# Patient Record
Sex: Female | Born: 1980 | Race: White | Hispanic: No | Marital: Single | Smoking: Current every day smoker
Health system: Southern US, Community
[De-identification: ages and names within clinical notes are randomized; demographics above are authoritative.]

## PROBLEM LIST (undated history)

## (undated) ENCOUNTER — Emergency Department (HOSPITAL_COMMUNITY): Admission: EM | Payer: Medicaid Other | Source: Home / Self Care

## (undated) DIAGNOSIS — F329 Major depressive disorder, single episode, unspecified: Secondary | ICD-10-CM

## (undated) DIAGNOSIS — K219 Gastro-esophageal reflux disease without esophagitis: Secondary | ICD-10-CM

## (undated) DIAGNOSIS — M869 Osteomyelitis, unspecified: Secondary | ICD-10-CM

## (undated) DIAGNOSIS — F319 Bipolar disorder, unspecified: Secondary | ICD-10-CM

## (undated) DIAGNOSIS — R0600 Dyspnea, unspecified: Secondary | ICD-10-CM

## (undated) DIAGNOSIS — J45909 Unspecified asthma, uncomplicated: Secondary | ICD-10-CM

## (undated) DIAGNOSIS — N289 Disorder of kidney and ureter, unspecified: Secondary | ICD-10-CM

## (undated) DIAGNOSIS — F32A Depression, unspecified: Secondary | ICD-10-CM

## (undated) DIAGNOSIS — T8781 Dehiscence of amputation stump: Secondary | ICD-10-CM

## (undated) DIAGNOSIS — O2690 Pregnancy related conditions, unspecified, unspecified trimester: Secondary | ICD-10-CM

## (undated) DIAGNOSIS — E785 Hyperlipidemia, unspecified: Secondary | ICD-10-CM

## (undated) DIAGNOSIS — I639 Cerebral infarction, unspecified: Secondary | ICD-10-CM

## (undated) DIAGNOSIS — O039 Complete or unspecified spontaneous abortion without complication: Secondary | ICD-10-CM

## (undated) DIAGNOSIS — I10 Essential (primary) hypertension: Secondary | ICD-10-CM

## (undated) DIAGNOSIS — R519 Headache, unspecified: Secondary | ICD-10-CM

## (undated) DIAGNOSIS — F419 Anxiety disorder, unspecified: Secondary | ICD-10-CM

## (undated) DIAGNOSIS — F209 Schizophrenia, unspecified: Secondary | ICD-10-CM

## (undated) DIAGNOSIS — R51 Headache: Secondary | ICD-10-CM

## (undated) DIAGNOSIS — K5900 Constipation, unspecified: Secondary | ICD-10-CM

## (undated) HISTORY — PX: CHOLECYSTECTOMY: SHX55

## (undated) HISTORY — PX: TUBAL LIGATION: SHX77

---

## 2002-10-03 ENCOUNTER — Encounter: Payer: Self-pay | Admitting: *Deleted

## 2002-10-03 ENCOUNTER — Inpatient Hospital Stay (HOSPITAL_COMMUNITY): Admission: AD | Admit: 2002-10-03 | Discharge: 2002-10-03 | Payer: Self-pay | Admitting: Obstetrics and Gynecology

## 2002-10-16 ENCOUNTER — Inpatient Hospital Stay (HOSPITAL_COMMUNITY): Admission: AD | Admit: 2002-10-16 | Discharge: 2002-10-17 | Payer: Self-pay | Admitting: *Deleted

## 2002-10-17 ENCOUNTER — Encounter: Payer: Self-pay | Admitting: Obstetrics and Gynecology

## 2002-11-15 ENCOUNTER — Inpatient Hospital Stay (HOSPITAL_COMMUNITY): Admission: AD | Admit: 2002-11-15 | Discharge: 2002-11-15 | Payer: Self-pay | Admitting: Family Medicine

## 2002-12-04 ENCOUNTER — Encounter: Payer: Self-pay | Admitting: Obstetrics and Gynecology

## 2002-12-04 ENCOUNTER — Inpatient Hospital Stay (HOSPITAL_COMMUNITY): Admission: AD | Admit: 2002-12-04 | Discharge: 2002-12-04 | Payer: Self-pay | Admitting: Obstetrics and Gynecology

## 2003-01-09 ENCOUNTER — Other Ambulatory Visit: Admission: RE | Admit: 2003-01-09 | Discharge: 2003-01-09 | Payer: Self-pay | Admitting: Obstetrics and Gynecology

## 2003-01-11 ENCOUNTER — Encounter: Payer: Self-pay | Admitting: Obstetrics and Gynecology

## 2003-01-11 ENCOUNTER — Ambulatory Visit (HOSPITAL_COMMUNITY): Admission: RE | Admit: 2003-01-11 | Discharge: 2003-01-11 | Payer: Self-pay | Admitting: Obstetrics and Gynecology

## 2003-01-29 ENCOUNTER — Inpatient Hospital Stay (HOSPITAL_COMMUNITY): Admission: AD | Admit: 2003-01-29 | Discharge: 2003-01-29 | Payer: Self-pay | Admitting: Obstetrics and Gynecology

## 2003-01-30 ENCOUNTER — Inpatient Hospital Stay (HOSPITAL_COMMUNITY): Admission: AD | Admit: 2003-01-30 | Discharge: 2003-01-30 | Payer: Self-pay | Admitting: Obstetrics and Gynecology

## 2003-01-30 ENCOUNTER — Encounter: Admission: RE | Admit: 2003-01-30 | Discharge: 2003-01-30 | Payer: Self-pay | Admitting: *Deleted

## 2003-02-06 ENCOUNTER — Encounter: Admission: RE | Admit: 2003-02-06 | Discharge: 2003-02-06 | Payer: Self-pay | Admitting: *Deleted

## 2003-02-19 ENCOUNTER — Ambulatory Visit (HOSPITAL_COMMUNITY): Admission: RE | Admit: 2003-02-19 | Discharge: 2003-02-19 | Payer: Self-pay | Admitting: *Deleted

## 2003-02-28 ENCOUNTER — Inpatient Hospital Stay (HOSPITAL_COMMUNITY): Admission: AD | Admit: 2003-02-28 | Discharge: 2003-02-28 | Payer: Self-pay | Admitting: Family Medicine

## 2003-02-28 ENCOUNTER — Encounter: Admission: RE | Admit: 2003-02-28 | Discharge: 2003-02-28 | Payer: Self-pay | Admitting: Family Medicine

## 2003-03-07 ENCOUNTER — Encounter: Admission: RE | Admit: 2003-03-07 | Discharge: 2003-03-07 | Payer: Self-pay | Admitting: Family Medicine

## 2003-03-21 ENCOUNTER — Ambulatory Visit (HOSPITAL_COMMUNITY): Admission: RE | Admit: 2003-03-21 | Discharge: 2003-03-21 | Payer: Self-pay | Admitting: *Deleted

## 2003-04-03 ENCOUNTER — Inpatient Hospital Stay (HOSPITAL_COMMUNITY): Admission: AD | Admit: 2003-04-03 | Discharge: 2003-04-03 | Payer: Self-pay | Admitting: Obstetrics and Gynecology

## 2003-04-03 ENCOUNTER — Encounter: Payer: Self-pay | Admitting: Obstetrics and Gynecology

## 2003-04-04 ENCOUNTER — Encounter: Admission: RE | Admit: 2003-04-04 | Discharge: 2003-04-04 | Payer: Self-pay | Admitting: *Deleted

## 2003-04-04 ENCOUNTER — Inpatient Hospital Stay (HOSPITAL_COMMUNITY): Admission: AD | Admit: 2003-04-04 | Discharge: 2003-04-09 | Payer: Self-pay | Admitting: Obstetrics & Gynecology

## 2003-04-07 ENCOUNTER — Encounter: Payer: Self-pay | Admitting: Obstetrics & Gynecology

## 2003-04-11 ENCOUNTER — Encounter: Admission: RE | Admit: 2003-04-11 | Discharge: 2003-04-11 | Payer: Self-pay | Admitting: *Deleted

## 2003-04-16 ENCOUNTER — Encounter: Admission: RE | Admit: 2003-04-16 | Discharge: 2003-04-16 | Payer: Self-pay | Admitting: *Deleted

## 2003-04-24 ENCOUNTER — Inpatient Hospital Stay (HOSPITAL_COMMUNITY): Admission: AD | Admit: 2003-04-24 | Discharge: 2003-04-25 | Payer: Self-pay | Admitting: Obstetrics and Gynecology

## 2003-05-05 ENCOUNTER — Inpatient Hospital Stay (HOSPITAL_COMMUNITY): Admission: AD | Admit: 2003-05-05 | Discharge: 2003-05-05 | Payer: Self-pay | Admitting: Obstetrics & Gynecology

## 2003-05-09 ENCOUNTER — Inpatient Hospital Stay (HOSPITAL_COMMUNITY): Admission: AD | Admit: 2003-05-09 | Discharge: 2003-05-13 | Payer: Self-pay | Admitting: Family Medicine

## 2003-05-10 ENCOUNTER — Encounter (INDEPENDENT_AMBULATORY_CARE_PROVIDER_SITE_OTHER): Payer: Self-pay

## 2003-06-11 ENCOUNTER — Inpatient Hospital Stay (HOSPITAL_COMMUNITY): Admission: AD | Admit: 2003-06-11 | Discharge: 2003-06-12 | Payer: Self-pay | Admitting: *Deleted

## 2003-07-18 ENCOUNTER — Inpatient Hospital Stay (HOSPITAL_COMMUNITY): Admission: AD | Admit: 2003-07-18 | Discharge: 2003-07-18 | Payer: Self-pay | Admitting: Obstetrics & Gynecology

## 2003-08-05 ENCOUNTER — Emergency Department (HOSPITAL_COMMUNITY): Admission: EM | Admit: 2003-08-05 | Discharge: 2003-08-06 | Payer: Self-pay | Admitting: Emergency Medicine

## 2003-08-06 ENCOUNTER — Emergency Department (HOSPITAL_COMMUNITY): Admission: AD | Admit: 2003-08-06 | Discharge: 2003-08-06 | Payer: Self-pay | Admitting: *Deleted

## 2003-09-19 ENCOUNTER — Encounter: Admission: RE | Admit: 2003-09-19 | Discharge: 2003-09-19 | Payer: Self-pay | Admitting: Obstetrics and Gynecology

## 2003-09-26 ENCOUNTER — Ambulatory Visit (HOSPITAL_COMMUNITY): Admission: RE | Admit: 2003-09-26 | Discharge: 2003-09-26 | Payer: Self-pay | Admitting: Obstetrics and Gynecology

## 2003-10-02 ENCOUNTER — Encounter: Admission: RE | Admit: 2003-10-02 | Discharge: 2003-10-02 | Payer: Self-pay | Admitting: *Deleted

## 2003-10-03 ENCOUNTER — Inpatient Hospital Stay (HOSPITAL_COMMUNITY): Admission: AD | Admit: 2003-10-03 | Discharge: 2003-10-07 | Payer: Self-pay | Admitting: Obstetrics & Gynecology

## 2003-10-10 ENCOUNTER — Inpatient Hospital Stay (HOSPITAL_COMMUNITY): Admission: AD | Admit: 2003-10-10 | Discharge: 2003-10-10 | Payer: Self-pay | Admitting: Obstetrics & Gynecology

## 2003-10-23 ENCOUNTER — Inpatient Hospital Stay (HOSPITAL_COMMUNITY): Admission: AD | Admit: 2003-10-23 | Discharge: 2003-10-23 | Payer: Self-pay | Admitting: *Deleted

## 2003-11-13 ENCOUNTER — Inpatient Hospital Stay (HOSPITAL_COMMUNITY): Admission: AD | Admit: 2003-11-13 | Discharge: 2003-11-13 | Payer: Self-pay | Admitting: Obstetrics and Gynecology

## 2004-12-01 ENCOUNTER — Inpatient Hospital Stay (HOSPITAL_COMMUNITY): Admission: AD | Admit: 2004-12-01 | Discharge: 2004-12-02 | Payer: Self-pay | Admitting: *Deleted

## 2004-12-02 ENCOUNTER — Inpatient Hospital Stay (HOSPITAL_COMMUNITY): Admission: AD | Admit: 2004-12-02 | Discharge: 2004-12-07 | Payer: Self-pay | Admitting: Obstetrics and Gynecology

## 2004-12-02 ENCOUNTER — Ambulatory Visit: Payer: Self-pay | Admitting: Family Medicine

## 2004-12-09 ENCOUNTER — Ambulatory Visit (HOSPITAL_COMMUNITY): Admission: RE | Admit: 2004-12-09 | Discharge: 2004-12-09 | Payer: Self-pay | Admitting: Obstetrics and Gynecology

## 2004-12-09 ENCOUNTER — Ambulatory Visit: Payer: Self-pay | Admitting: *Deleted

## 2004-12-16 ENCOUNTER — Inpatient Hospital Stay (HOSPITAL_COMMUNITY): Admission: AD | Admit: 2004-12-16 | Discharge: 2004-12-17 | Payer: Self-pay | Admitting: Obstetrics and Gynecology

## 2005-03-06 ENCOUNTER — Inpatient Hospital Stay (HOSPITAL_COMMUNITY): Admission: AD | Admit: 2005-03-06 | Discharge: 2005-03-07 | Payer: Self-pay | Admitting: Obstetrics & Gynecology

## 2005-03-10 ENCOUNTER — Inpatient Hospital Stay (HOSPITAL_COMMUNITY): Admission: AD | Admit: 2005-03-10 | Discharge: 2005-03-12 | Payer: Self-pay | Admitting: Obstetrics and Gynecology

## 2005-03-11 ENCOUNTER — Ambulatory Visit: Payer: Self-pay | Admitting: Obstetrics & Gynecology

## 2005-04-01 ENCOUNTER — Inpatient Hospital Stay (HOSPITAL_COMMUNITY): Admission: AD | Admit: 2005-04-01 | Discharge: 2005-04-01 | Payer: Self-pay | Admitting: *Deleted

## 2005-04-01 ENCOUNTER — Ambulatory Visit: Payer: Self-pay | Admitting: Obstetrics and Gynecology

## 2005-04-27 ENCOUNTER — Inpatient Hospital Stay (HOSPITAL_COMMUNITY): Admission: AD | Admit: 2005-04-27 | Discharge: 2005-04-27 | Payer: Self-pay | Admitting: *Deleted

## 2005-04-27 ENCOUNTER — Ambulatory Visit: Payer: Self-pay | Admitting: Obstetrics and Gynecology

## 2005-05-22 ENCOUNTER — Inpatient Hospital Stay (HOSPITAL_COMMUNITY): Admission: AD | Admit: 2005-05-22 | Discharge: 2005-05-23 | Payer: Self-pay | Admitting: Obstetrics and Gynecology

## 2005-05-23 ENCOUNTER — Observation Stay (HOSPITAL_COMMUNITY): Admission: AD | Admit: 2005-05-23 | Discharge: 2005-05-23 | Payer: Self-pay | Admitting: Family Medicine

## 2005-05-23 ENCOUNTER — Ambulatory Visit: Payer: Self-pay | Admitting: Obstetrics and Gynecology

## 2005-05-29 ENCOUNTER — Inpatient Hospital Stay (HOSPITAL_COMMUNITY): Admission: AD | Admit: 2005-05-29 | Discharge: 2005-05-29 | Payer: Self-pay | Admitting: Obstetrics and Gynecology

## 2005-05-29 ENCOUNTER — Ambulatory Visit: Payer: Self-pay | Admitting: *Deleted

## 2005-05-30 ENCOUNTER — Inpatient Hospital Stay (HOSPITAL_COMMUNITY): Admission: AD | Admit: 2005-05-30 | Discharge: 2005-05-31 | Payer: Self-pay | Admitting: *Deleted

## 2005-05-30 ENCOUNTER — Inpatient Hospital Stay (HOSPITAL_COMMUNITY): Admission: AD | Admit: 2005-05-30 | Discharge: 2005-05-30 | Payer: Self-pay | Admitting: *Deleted

## 2005-06-01 ENCOUNTER — Ambulatory Visit (HOSPITAL_COMMUNITY): Admission: RE | Admit: 2005-06-01 | Discharge: 2005-06-01 | Payer: Self-pay | Admitting: Family Medicine

## 2005-06-01 ENCOUNTER — Inpatient Hospital Stay (HOSPITAL_COMMUNITY): Admission: AD | Admit: 2005-06-01 | Discharge: 2005-06-01 | Payer: Self-pay | Admitting: *Deleted

## 2005-06-19 ENCOUNTER — Ambulatory Visit: Payer: Self-pay | Admitting: Obstetrics and Gynecology

## 2005-06-19 ENCOUNTER — Inpatient Hospital Stay (HOSPITAL_COMMUNITY): Admission: AD | Admit: 2005-06-19 | Discharge: 2005-06-20 | Payer: Self-pay | Admitting: *Deleted

## 2005-07-01 ENCOUNTER — Ambulatory Visit: Payer: Self-pay | Admitting: *Deleted

## 2005-07-01 ENCOUNTER — Inpatient Hospital Stay (HOSPITAL_COMMUNITY): Admission: AD | Admit: 2005-07-01 | Discharge: 2005-07-02 | Payer: Self-pay | Admitting: Obstetrics and Gynecology

## 2005-07-04 ENCOUNTER — Ambulatory Visit: Payer: Self-pay | Admitting: Obstetrics and Gynecology

## 2005-07-04 ENCOUNTER — Inpatient Hospital Stay (HOSPITAL_COMMUNITY): Admission: AD | Admit: 2005-07-04 | Discharge: 2005-07-04 | Payer: Self-pay | Admitting: Obstetrics & Gynecology

## 2005-07-07 ENCOUNTER — Ambulatory Visit: Payer: Self-pay | Admitting: *Deleted

## 2005-07-07 ENCOUNTER — Inpatient Hospital Stay (HOSPITAL_COMMUNITY): Admission: RE | Admit: 2005-07-07 | Discharge: 2005-07-07 | Payer: Self-pay | Admitting: *Deleted

## 2005-07-08 ENCOUNTER — Ambulatory Visit (HOSPITAL_COMMUNITY): Admission: RE | Admit: 2005-07-08 | Discharge: 2005-07-08 | Payer: Self-pay | Admitting: *Deleted

## 2005-07-08 ENCOUNTER — Ambulatory Visit: Payer: Self-pay | Admitting: *Deleted

## 2005-07-09 ENCOUNTER — Ambulatory Visit: Payer: Self-pay | Admitting: *Deleted

## 2005-07-09 ENCOUNTER — Inpatient Hospital Stay (HOSPITAL_COMMUNITY): Admission: AD | Admit: 2005-07-09 | Discharge: 2005-07-11 | Payer: Self-pay | Admitting: *Deleted

## 2005-07-09 ENCOUNTER — Encounter (INDEPENDENT_AMBULATORY_CARE_PROVIDER_SITE_OTHER): Payer: Self-pay | Admitting: Specialist

## 2005-07-18 ENCOUNTER — Inpatient Hospital Stay (HOSPITAL_COMMUNITY): Admission: AD | Admit: 2005-07-18 | Discharge: 2005-07-18 | Payer: Self-pay | Admitting: Obstetrics and Gynecology

## 2006-02-28 ENCOUNTER — Emergency Department (HOSPITAL_COMMUNITY): Admission: EM | Admit: 2006-02-28 | Discharge: 2006-03-01 | Payer: Self-pay | Admitting: Emergency Medicine

## 2006-03-21 ENCOUNTER — Emergency Department: Payer: Self-pay | Admitting: Emergency Medicine

## 2006-04-25 ENCOUNTER — Emergency Department (HOSPITAL_COMMUNITY): Admission: EM | Admit: 2006-04-25 | Discharge: 2006-04-25 | Payer: Self-pay | Admitting: Emergency Medicine

## 2006-05-01 ENCOUNTER — Emergency Department (HOSPITAL_COMMUNITY): Admission: EM | Admit: 2006-05-01 | Discharge: 2006-05-01 | Payer: Self-pay | Admitting: Emergency Medicine

## 2006-06-18 ENCOUNTER — Emergency Department (HOSPITAL_COMMUNITY): Admission: EM | Admit: 2006-06-18 | Discharge: 2006-06-18 | Payer: Self-pay | Admitting: Emergency Medicine

## 2006-06-25 ENCOUNTER — Emergency Department (HOSPITAL_COMMUNITY): Admission: EM | Admit: 2006-06-25 | Discharge: 2006-06-25 | Payer: Self-pay | Admitting: Emergency Medicine

## 2006-12-02 ENCOUNTER — Emergency Department (HOSPITAL_COMMUNITY): Admission: EM | Admit: 2006-12-02 | Discharge: 2006-12-02 | Payer: Self-pay | Admitting: Family Medicine

## 2006-12-06 ENCOUNTER — Ambulatory Visit: Payer: Self-pay | Admitting: Family Medicine

## 2006-12-13 ENCOUNTER — Emergency Department (HOSPITAL_COMMUNITY): Admission: EM | Admit: 2006-12-13 | Discharge: 2006-12-13 | Payer: Self-pay | Admitting: Family Medicine

## 2007-01-30 ENCOUNTER — Emergency Department (HOSPITAL_COMMUNITY): Admission: EM | Admit: 2007-01-30 | Discharge: 2007-01-30 | Payer: Self-pay | Admitting: Emergency Medicine

## 2007-02-07 ENCOUNTER — Emergency Department (HOSPITAL_COMMUNITY): Admission: EM | Admit: 2007-02-07 | Discharge: 2007-02-07 | Payer: Self-pay | Admitting: Family Medicine

## 2007-04-25 ENCOUNTER — Emergency Department (HOSPITAL_COMMUNITY): Admission: EM | Admit: 2007-04-25 | Discharge: 2007-04-25 | Payer: Self-pay | Admitting: Emergency Medicine

## 2007-06-20 ENCOUNTER — Emergency Department (HOSPITAL_COMMUNITY): Admission: EM | Admit: 2007-06-20 | Discharge: 2007-06-20 | Payer: Self-pay | Admitting: Emergency Medicine

## 2007-06-21 ENCOUNTER — Emergency Department (HOSPITAL_COMMUNITY): Admission: EM | Admit: 2007-06-21 | Discharge: 2007-06-22 | Payer: Self-pay | Admitting: Emergency Medicine

## 2007-06-25 ENCOUNTER — Emergency Department (HOSPITAL_COMMUNITY): Admission: EM | Admit: 2007-06-25 | Discharge: 2007-06-25 | Payer: Self-pay | Admitting: Emergency Medicine

## 2007-07-02 ENCOUNTER — Emergency Department (HOSPITAL_COMMUNITY): Admission: EM | Admit: 2007-07-02 | Discharge: 2007-07-02 | Payer: Self-pay | Admitting: Emergency Medicine

## 2007-08-12 ENCOUNTER — Emergency Department (HOSPITAL_COMMUNITY): Admission: EM | Admit: 2007-08-12 | Discharge: 2007-08-12 | Payer: Self-pay | Admitting: Emergency Medicine

## 2007-09-13 ENCOUNTER — Emergency Department (HOSPITAL_COMMUNITY): Admission: EM | Admit: 2007-09-13 | Discharge: 2007-09-13 | Payer: Self-pay | Admitting: Emergency Medicine

## 2007-09-15 ENCOUNTER — Emergency Department (HOSPITAL_COMMUNITY): Admission: EM | Admit: 2007-09-15 | Discharge: 2007-09-16 | Payer: Self-pay | Admitting: Emergency Medicine

## 2007-09-16 ENCOUNTER — Emergency Department (HOSPITAL_COMMUNITY): Admission: EM | Admit: 2007-09-16 | Discharge: 2007-09-16 | Payer: Self-pay | Admitting: Emergency Medicine

## 2007-09-21 ENCOUNTER — Emergency Department (HOSPITAL_COMMUNITY): Admission: EM | Admit: 2007-09-21 | Discharge: 2007-09-21 | Payer: Self-pay | Admitting: Emergency Medicine

## 2007-09-24 ENCOUNTER — Emergency Department (HOSPITAL_COMMUNITY): Admission: EM | Admit: 2007-09-24 | Discharge: 2007-09-25 | Payer: Self-pay | Admitting: Emergency Medicine

## 2007-11-22 ENCOUNTER — Emergency Department (HOSPITAL_COMMUNITY): Admission: EM | Admit: 2007-11-22 | Discharge: 2007-11-22 | Payer: Self-pay | Admitting: Family Medicine

## 2007-12-01 ENCOUNTER — Emergency Department (HOSPITAL_COMMUNITY): Admission: EM | Admit: 2007-12-01 | Discharge: 2007-12-02 | Payer: Self-pay | Admitting: Emergency Medicine

## 2008-01-06 ENCOUNTER — Emergency Department (HOSPITAL_COMMUNITY): Admission: EM | Admit: 2008-01-06 | Discharge: 2008-01-06 | Payer: Self-pay | Admitting: Emergency Medicine

## 2008-01-10 ENCOUNTER — Emergency Department (HOSPITAL_COMMUNITY): Admission: EM | Admit: 2008-01-10 | Discharge: 2008-01-11 | Payer: Self-pay | Admitting: Emergency Medicine

## 2008-01-23 ENCOUNTER — Emergency Department (HOSPITAL_COMMUNITY): Admission: EM | Admit: 2008-01-23 | Discharge: 2008-01-24 | Payer: Self-pay | Admitting: Emergency Medicine

## 2008-01-30 ENCOUNTER — Emergency Department (HOSPITAL_COMMUNITY): Admission: EM | Admit: 2008-01-30 | Discharge: 2008-01-31 | Payer: Self-pay | Admitting: Emergency Medicine

## 2008-02-03 ENCOUNTER — Emergency Department (HOSPITAL_COMMUNITY): Admission: EM | Admit: 2008-02-03 | Discharge: 2008-02-03 | Payer: Self-pay | Admitting: Emergency Medicine

## 2008-02-10 ENCOUNTER — Emergency Department (HOSPITAL_COMMUNITY): Admission: EM | Admit: 2008-02-10 | Discharge: 2008-02-10 | Payer: Self-pay | Admitting: Emergency Medicine

## 2008-02-16 ENCOUNTER — Emergency Department (HOSPITAL_COMMUNITY): Admission: EM | Admit: 2008-02-16 | Discharge: 2008-02-17 | Payer: Self-pay | Admitting: Emergency Medicine

## 2008-02-19 ENCOUNTER — Emergency Department (HOSPITAL_COMMUNITY): Admission: EM | Admit: 2008-02-19 | Discharge: 2008-02-19 | Payer: Self-pay | Admitting: Emergency Medicine

## 2008-04-03 ENCOUNTER — Emergency Department (HOSPITAL_COMMUNITY): Admission: EM | Admit: 2008-04-03 | Discharge: 2008-04-04 | Payer: Self-pay | Admitting: Emergency Medicine

## 2008-04-19 ENCOUNTER — Emergency Department (HOSPITAL_COMMUNITY): Admission: EM | Admit: 2008-04-19 | Discharge: 2008-04-20 | Payer: Self-pay | Admitting: Emergency Medicine

## 2008-04-20 ENCOUNTER — Emergency Department (HOSPITAL_COMMUNITY): Admission: EM | Admit: 2008-04-20 | Discharge: 2008-04-21 | Payer: Self-pay | Admitting: Emergency Medicine

## 2008-04-23 ENCOUNTER — Emergency Department (HOSPITAL_COMMUNITY): Admission: EM | Admit: 2008-04-23 | Discharge: 2008-04-24 | Payer: Self-pay | Admitting: Emergency Medicine

## 2008-05-12 ENCOUNTER — Emergency Department (HOSPITAL_COMMUNITY): Admission: EM | Admit: 2008-05-12 | Discharge: 2008-05-13 | Payer: Self-pay | Admitting: Emergency Medicine

## 2008-05-19 ENCOUNTER — Emergency Department (HOSPITAL_COMMUNITY): Admission: EM | Admit: 2008-05-19 | Discharge: 2008-05-19 | Payer: Self-pay | Admitting: Emergency Medicine

## 2008-06-03 ENCOUNTER — Emergency Department (HOSPITAL_COMMUNITY): Admission: EM | Admit: 2008-06-03 | Discharge: 2008-06-03 | Payer: Self-pay | Admitting: Emergency Medicine

## 2008-06-09 ENCOUNTER — Emergency Department (HOSPITAL_COMMUNITY): Admission: EM | Admit: 2008-06-09 | Discharge: 2008-06-09 | Payer: Self-pay | Admitting: Emergency Medicine

## 2008-06-14 ENCOUNTER — Emergency Department (HOSPITAL_COMMUNITY): Admission: EM | Admit: 2008-06-14 | Discharge: 2008-06-15 | Payer: Self-pay | Admitting: Emergency Medicine

## 2008-06-20 ENCOUNTER — Other Ambulatory Visit: Payer: Self-pay | Admitting: Emergency Medicine

## 2008-06-20 ENCOUNTER — Inpatient Hospital Stay (HOSPITAL_COMMUNITY): Admission: AD | Admit: 2008-06-20 | Discharge: 2008-06-20 | Payer: Self-pay | Admitting: Obstetrics & Gynecology

## 2008-06-24 ENCOUNTER — Encounter (INDEPENDENT_AMBULATORY_CARE_PROVIDER_SITE_OTHER): Payer: Self-pay | Admitting: Family Medicine

## 2008-06-24 ENCOUNTER — Ambulatory Visit: Payer: Self-pay | Admitting: Internal Medicine

## 2008-06-24 LAB — CONVERTED CEMR LAB
AST: 9 units/L (ref 0–37)
Albumin: 4.1 g/dL (ref 3.5–5.2)
Alkaline Phosphatase: 60 units/L (ref 39–117)
BUN: 11 mg/dL (ref 6–23)
Basophils Absolute: 0.1 10*3/uL (ref 0.0–0.1)
Basophils Relative: 1 % (ref 0–1)
CO2: 20 meq/L (ref 19–32)
Calcium: 8.7 mg/dL (ref 8.4–10.5)
Chloride: 103 meq/L (ref 96–112)
Cholesterol: 176 mg/dL (ref 0–200)
Creatinine, Ser: 0.91 mg/dL (ref 0.40–1.20)
Eosinophils Absolute: 0.6 10*3/uL (ref 0.0–0.7)
Eosinophils Relative: 6 % — ABNORMAL HIGH (ref 0–5)
Glucose, Bld: 296 mg/dL — ABNORMAL HIGH (ref 70–99)
HCT: 44.6 % (ref 36.0–46.0)
HDL: 32 mg/dL — ABNORMAL LOW (ref >39)
Hemoglobin: 15.1 g/dL — ABNORMAL HIGH (ref 12.0–15.0)
Lymphocytes Relative: 34 % (ref 12–46)
Lymphs Abs: 3.5 10*3/uL (ref 0.7–4.0)
MCHC: 33.9 g/dL (ref 30.0–36.0)
MCV: 92.7 fL (ref 78.0–100.0)
Microalb, Ur: 2.34 mg/dL — ABNORMAL HIGH (ref 0.00–1.89)
Monocytes Absolute: 0.6 10*3/uL (ref 0.1–1.0)
Monocytes Relative: 6 % (ref 3–12)
Neutro Abs: 5.4 10*3/uL (ref 1.7–7.7)
Neutrophils Relative %: 53 % (ref 43–77)
Platelets: 236 10*3/uL (ref 150–400)
Potassium: 4.8 meq/L (ref 3.5–5.3)
RBC: 4.81 M/uL (ref 3.87–5.11)
RDW: 12.7 % (ref 11.5–15.5)
Sodium: 135 meq/L (ref 135–145)
Total Bilirubin: 0.3 mg/dL (ref 0.3–1.2)
Total CHOL/HDL Ratio: 5.5
Total Protein: 6.9 g/dL (ref 6.0–8.3)
Triglycerides: 465 mg/dL — ABNORMAL HIGH (ref <150)
WBC: 10.2 10*3/uL (ref 4.0–10.5)

## 2008-06-28 ENCOUNTER — Ambulatory Visit (HOSPITAL_COMMUNITY): Admission: RE | Admit: 2008-06-28 | Discharge: 2008-06-28 | Payer: Self-pay | Admitting: Family Medicine

## 2008-07-11 ENCOUNTER — Emergency Department (HOSPITAL_COMMUNITY): Admission: EM | Admit: 2008-07-11 | Discharge: 2008-07-11 | Payer: Self-pay | Admitting: Emergency Medicine

## 2008-07-13 ENCOUNTER — Inpatient Hospital Stay (HOSPITAL_COMMUNITY): Admission: AD | Admit: 2008-07-13 | Discharge: 2008-07-13 | Payer: Self-pay | Admitting: Family Medicine

## 2008-07-14 ENCOUNTER — Emergency Department (HOSPITAL_COMMUNITY): Admission: EM | Admit: 2008-07-14 | Discharge: 2008-07-15 | Payer: Self-pay | Admitting: Emergency Medicine

## 2008-08-03 ENCOUNTER — Emergency Department (HOSPITAL_COMMUNITY): Admission: EM | Admit: 2008-08-03 | Discharge: 2008-08-04 | Payer: Self-pay | Admitting: Emergency Medicine

## 2008-08-09 ENCOUNTER — Ambulatory Visit: Payer: Self-pay | Admitting: Surgery

## 2008-08-09 ENCOUNTER — Ambulatory Visit: Payer: Self-pay | Admitting: Internal Medicine

## 2008-08-09 ENCOUNTER — Ambulatory Visit (HOSPITAL_COMMUNITY): Admission: RE | Admit: 2008-08-09 | Discharge: 2008-08-09 | Payer: Self-pay | Admitting: Internal Medicine

## 2008-08-09 ENCOUNTER — Encounter: Payer: Self-pay | Admitting: Internal Medicine

## 2008-08-21 ENCOUNTER — Emergency Department (HOSPITAL_COMMUNITY): Admission: EM | Admit: 2008-08-21 | Discharge: 2008-08-21 | Payer: Self-pay | Admitting: Emergency Medicine

## 2008-10-06 ENCOUNTER — Emergency Department (HOSPITAL_COMMUNITY): Admission: EM | Admit: 2008-10-06 | Discharge: 2008-10-06 | Payer: Self-pay | Admitting: Emergency Medicine

## 2008-10-16 ENCOUNTER — Emergency Department (HOSPITAL_COMMUNITY): Admission: EM | Admit: 2008-10-16 | Discharge: 2008-10-16 | Payer: Self-pay | Admitting: Emergency Medicine

## 2008-10-19 ENCOUNTER — Emergency Department (HOSPITAL_COMMUNITY): Admission: EM | Admit: 2008-10-19 | Discharge: 2008-10-20 | Payer: Self-pay | Admitting: Emergency Medicine

## 2008-10-25 ENCOUNTER — Ambulatory Visit: Payer: Self-pay | Admitting: Family Medicine

## 2008-10-31 ENCOUNTER — Ambulatory Visit: Payer: Self-pay | Admitting: Vascular Surgery

## 2008-10-31 ENCOUNTER — Encounter: Payer: Self-pay | Admitting: Internal Medicine

## 2008-10-31 ENCOUNTER — Ambulatory Visit (HOSPITAL_COMMUNITY): Admission: RE | Admit: 2008-10-31 | Discharge: 2008-10-31 | Payer: Self-pay | Admitting: Internal Medicine

## 2008-11-07 ENCOUNTER — Other Ambulatory Visit: Payer: Self-pay

## 2008-11-08 ENCOUNTER — Ambulatory Visit: Payer: Self-pay | Admitting: Psychiatry

## 2008-11-08 ENCOUNTER — Other Ambulatory Visit: Payer: Self-pay | Admitting: Emergency Medicine

## 2008-11-08 ENCOUNTER — Observation Stay (HOSPITAL_COMMUNITY): Admission: EM | Admit: 2008-11-08 | Discharge: 2008-11-08 | Payer: Self-pay | Admitting: Psychiatry

## 2008-11-21 ENCOUNTER — Emergency Department (HOSPITAL_COMMUNITY): Admission: EM | Admit: 2008-11-21 | Discharge: 2008-11-21 | Payer: Self-pay | Admitting: Emergency Medicine

## 2008-11-26 ENCOUNTER — Emergency Department (HOSPITAL_COMMUNITY): Admission: EM | Admit: 2008-11-26 | Discharge: 2008-11-27 | Payer: Self-pay | Admitting: Emergency Medicine

## 2008-11-29 ENCOUNTER — Emergency Department (HOSPITAL_COMMUNITY): Admission: EM | Admit: 2008-11-29 | Discharge: 2008-11-29 | Payer: Self-pay | Admitting: Emergency Medicine

## 2008-12-06 ENCOUNTER — Emergency Department (HOSPITAL_COMMUNITY): Admission: EM | Admit: 2008-12-06 | Discharge: 2008-12-06 | Payer: Self-pay | Admitting: Emergency Medicine

## 2008-12-12 ENCOUNTER — Encounter (INDEPENDENT_AMBULATORY_CARE_PROVIDER_SITE_OTHER): Payer: Self-pay | Admitting: Adult Health

## 2008-12-12 ENCOUNTER — Ambulatory Visit: Payer: Self-pay | Admitting: Internal Medicine

## 2008-12-12 LAB — CONVERTED CEMR LAB
ALT: <8 units/L (ref 0–35)
AST: 8 units/L (ref 0–37)
Albumin: 4.1 g/dL (ref 3.5–5.2)
Alkaline Phosphatase: 59 units/L (ref 39–117)
BUN: 10 mg/dL (ref 6–23)
Band Neutrophils: 0 % (ref 0–10)
Basophils Absolute: 0 10*3/uL (ref 0.0–0.1)
Basophils Relative: 0 % (ref 0–1)
CO2: 20 meq/L (ref 19–32)
Calcium: 8.9 mg/dL (ref 8.4–10.5)
Chloride: 102 meq/L (ref 96–112)
Cholesterol: 177 mg/dL (ref 0–200)
Creatinine, Ser: 0.85 mg/dL (ref 0.40–1.20)
Eosinophils Absolute: 0.5 10*3/uL (ref 0.0–0.7)
Eosinophils Relative: 5 % (ref 0–5)
Glucose, Bld: 377 mg/dL — ABNORMAL HIGH (ref 70–99)
HCT: 44.7 % (ref 36.0–46.0)
HDL: 27 mg/dL — ABNORMAL LOW (ref >39)
Hemoglobin: 15.8 g/dL — ABNORMAL HIGH (ref 12.0–15.0)
Lipase: 36 units/L (ref 0–75)
Lymphocytes Relative: 32 % (ref 12–46)
Lymphs Abs: 2.9 10*3/uL (ref 0.7–4.0)
MCHC: 35.3 g/dL (ref 30.0–36.0)
MCV: 90.5 fL (ref 78.0–100.0)
Monocytes Absolute: 0.6 10*3/uL (ref 0.1–1.0)
Monocytes Relative: 6 % (ref 3–12)
Neutro Abs: 5.1 10*3/uL (ref 1.7–7.7)
Neutrophils Relative %: 56 % (ref 43–77)
Platelets: 241 10*3/uL (ref 150–400)
Potassium: 4.8 meq/L (ref 3.5–5.3)
RBC: 4.94 M/uL (ref 3.87–5.11)
RDW: 12.3 % (ref 11.5–15.5)
Sodium: 135 meq/L (ref 135–145)
TSH: 0.992 microintl units/mL (ref 0.350–4.50)
Total Bilirubin: 0.5 mg/dL (ref 0.3–1.2)
Total CHOL/HDL Ratio: 6.6
Total Protein: 7.1 g/dL (ref 6.0–8.3)
Triglycerides: 579 mg/dL — ABNORMAL HIGH (ref <150)
WBC: 9 10*3/uL (ref 4.0–10.5)

## 2008-12-27 ENCOUNTER — Ambulatory Visit: Payer: Self-pay | Admitting: Internal Medicine

## 2008-12-31 ENCOUNTER — Emergency Department (HOSPITAL_COMMUNITY): Admission: EM | Admit: 2008-12-31 | Discharge: 2009-01-01 | Payer: Self-pay | Admitting: Emergency Medicine

## 2009-01-02 ENCOUNTER — Ambulatory Visit: Payer: Self-pay | Admitting: Obstetrics and Gynecology

## 2009-01-03 ENCOUNTER — Ambulatory Visit (HOSPITAL_COMMUNITY): Admission: RE | Admit: 2009-01-03 | Discharge: 2009-01-03 | Payer: Self-pay | Admitting: Family Medicine

## 2009-01-16 ENCOUNTER — Emergency Department (HOSPITAL_COMMUNITY): Admission: EM | Admit: 2009-01-16 | Discharge: 2009-01-16 | Payer: Self-pay | Admitting: Emergency Medicine

## 2009-01-27 ENCOUNTER — Ambulatory Visit: Payer: Self-pay | Admitting: Internal Medicine

## 2009-02-26 ENCOUNTER — Emergency Department (HOSPITAL_COMMUNITY): Admission: EM | Admit: 2009-02-26 | Discharge: 2009-02-26 | Payer: Self-pay | Admitting: Emergency Medicine

## 2009-03-08 ENCOUNTER — Emergency Department (HOSPITAL_COMMUNITY): Admission: EM | Admit: 2009-03-08 | Discharge: 2009-03-08 | Payer: Self-pay | Admitting: Emergency Medicine

## 2009-03-18 ENCOUNTER — Emergency Department (HOSPITAL_COMMUNITY): Admission: EM | Admit: 2009-03-18 | Discharge: 2009-03-18 | Payer: Self-pay | Admitting: Emergency Medicine

## 2009-04-06 ENCOUNTER — Emergency Department (HOSPITAL_COMMUNITY): Admission: EM | Admit: 2009-04-06 | Discharge: 2009-04-06 | Payer: Self-pay | Admitting: Emergency Medicine

## 2009-04-30 ENCOUNTER — Emergency Department (HOSPITAL_COMMUNITY): Admission: EM | Admit: 2009-04-30 | Discharge: 2009-04-30 | Payer: Self-pay | Admitting: Emergency Medicine

## 2009-05-02 ENCOUNTER — Emergency Department (HOSPITAL_COMMUNITY): Admission: EM | Admit: 2009-05-02 | Discharge: 2009-05-03 | Payer: Self-pay | Admitting: Emergency Medicine

## 2009-05-06 ENCOUNTER — Emergency Department (HOSPITAL_COMMUNITY): Admission: EM | Admit: 2009-05-06 | Discharge: 2009-05-06 | Payer: Self-pay | Admitting: Emergency Medicine

## 2009-05-20 ENCOUNTER — Ambulatory Visit: Payer: Self-pay | Admitting: Internal Medicine

## 2009-05-20 ENCOUNTER — Encounter (INDEPENDENT_AMBULATORY_CARE_PROVIDER_SITE_OTHER): Payer: Self-pay | Admitting: Adult Health

## 2009-05-20 LAB — CONVERTED CEMR LAB
AST: 9 units/L (ref 0–37)
Albumin: 4.1 g/dL (ref 3.5–5.2)
Alkaline Phosphatase: 61 units/L (ref 39–117)
Amphetamine Screen, Ur: NEGATIVE
BUN: 8 mg/dL (ref 6–23)
Basophils Absolute: 0 10*3/uL (ref 0.0–0.1)
Basophils Relative: 0 % (ref 0–1)
CO2: 27 meq/L (ref 19–32)
Calcium: 8.8 mg/dL (ref 8.4–10.5)
Chloride: 102 meq/L (ref 96–112)
Cholesterol: 156 mg/dL (ref 0–200)
Creatinine, Ser: 0.8 mg/dL (ref 0.40–1.20)
Creatinine,U: 62.2 mg/dL
Eosinophils Absolute: 0.5 10*3/uL (ref 0.0–0.7)
Eosinophils Relative: 6 % — ABNORMAL HIGH (ref 0–5)
Glucose, Bld: 348 mg/dL — ABNORMAL HIGH (ref 70–99)
HCT: 43 % (ref 36.0–46.0)
HDL: 30 mg/dL — ABNORMAL LOW (ref >39)
Hemoglobin: 15.3 g/dL — ABNORMAL HIGH (ref 12.0–15.0)
LDL Cholesterol: 60 mg/dL (ref 0–99)
Lymphocytes Relative: 31 % (ref 12–46)
Lymphs Abs: 2.8 10*3/uL (ref 0.7–4.0)
MCHC: 35.6 g/dL (ref 30.0–36.0)
MCV: 88.8 fL (ref 78.0–100.0)
Marijuana Metabolite: NEGATIVE
Microalb, Ur: 1.35 mg/dL (ref 0.00–1.89)
Monocytes Absolute: 0.4 10*3/uL (ref 0.1–1.0)
Monocytes Relative: 5 % (ref 3–12)
Neutro Abs: 5.3 10*3/uL (ref 1.7–7.7)
Neutrophils Relative %: 59 % (ref 43–77)
Opiate Screen, Urine: NEGATIVE
Phencyclidine (PCP): NEGATIVE
Platelets: 266 10*3/uL (ref 150–400)
Potassium: 3.9 meq/L (ref 3.5–5.3)
Propoxyphene: NEGATIVE
RBC: 4.84 M/uL (ref 3.87–5.11)
RDW: 12 % (ref 11.5–15.5)
Sodium: 138 meq/L (ref 135–145)
Total Bilirubin: 0.4 mg/dL (ref 0.3–1.2)
Total Protein: 7.1 g/dL (ref 6.0–8.3)
Triglycerides: 328 mg/dL — ABNORMAL HIGH (ref <150)
VLDL: 66 mg/dL — ABNORMAL HIGH (ref 0–40)
WBC: 9.1 10*3/uL (ref 4.0–10.5)

## 2009-05-28 ENCOUNTER — Ambulatory Visit: Payer: Self-pay | Admitting: Internal Medicine

## 2009-06-09 ENCOUNTER — Emergency Department (HOSPITAL_COMMUNITY): Admission: EM | Admit: 2009-06-09 | Discharge: 2009-06-10 | Payer: Self-pay | Admitting: Emergency Medicine

## 2009-06-16 ENCOUNTER — Emergency Department (HOSPITAL_COMMUNITY): Admission: EM | Admit: 2009-06-16 | Discharge: 2009-06-16 | Payer: Self-pay | Admitting: Emergency Medicine

## 2009-08-04 ENCOUNTER — Emergency Department (HOSPITAL_COMMUNITY): Admission: EM | Admit: 2009-08-04 | Discharge: 2009-08-04 | Payer: Self-pay | Admitting: Emergency Medicine

## 2009-08-06 ENCOUNTER — Emergency Department (HOSPITAL_COMMUNITY): Admission: EM | Admit: 2009-08-06 | Discharge: 2009-08-06 | Payer: Self-pay | Admitting: Family Medicine

## 2009-08-11 ENCOUNTER — Encounter (INDEPENDENT_AMBULATORY_CARE_PROVIDER_SITE_OTHER): Payer: Self-pay | Admitting: Adult Health

## 2009-08-11 ENCOUNTER — Ambulatory Visit: Payer: Self-pay | Admitting: Family Medicine

## 2009-08-11 LAB — CONVERTED CEMR LAB
Basophils Absolute: 0 10*3/uL (ref 0.0–0.1)
Basophils Relative: 0 % (ref 0–1)
Eosinophils Absolute: 0.3 10*3/uL (ref 0.0–0.7)
Eosinophils Relative: 3 % (ref 0–5)
HCT: 41.8 % (ref 36.0–46.0)
Hemoglobin: 14.2 g/dL (ref 12.0–15.0)
Lymphocytes Relative: 30 % (ref 12–46)
Lymphs Abs: 3.1 10*3/uL (ref 0.7–4.0)
MCHC: 34 g/dL (ref 30.0–36.0)
MCV: 91.3 fL (ref 78.0–100.0)
Monocytes Absolute: 0.5 10*3/uL (ref 0.1–1.0)
Monocytes Relative: 5 % (ref 3–12)
Neutro Abs: 6.3 10*3/uL (ref 1.7–7.7)
Neutrophils Relative %: 62 % (ref 43–77)
Platelets: 276 10*3/uL (ref 150–400)
RBC: 4.58 M/uL (ref 3.87–5.11)
RDW: 11.9 % (ref 11.5–15.5)
WBC: 10.2 10*3/uL (ref 4.0–10.5)

## 2009-08-13 ENCOUNTER — Ambulatory Visit: Payer: Self-pay | Admitting: Internal Medicine

## 2009-08-14 ENCOUNTER — Encounter (INDEPENDENT_AMBULATORY_CARE_PROVIDER_SITE_OTHER): Payer: Self-pay | Admitting: Adult Health

## 2009-08-23 ENCOUNTER — Emergency Department (HOSPITAL_COMMUNITY): Admission: EM | Admit: 2009-08-23 | Discharge: 2009-08-24 | Payer: Self-pay | Admitting: Emergency Medicine

## 2009-10-20 ENCOUNTER — Emergency Department (HOSPITAL_COMMUNITY): Admission: EM | Admit: 2009-10-20 | Discharge: 2009-10-20 | Payer: Self-pay | Admitting: Emergency Medicine

## 2009-11-27 ENCOUNTER — Emergency Department (HOSPITAL_COMMUNITY): Admission: EM | Admit: 2009-11-27 | Discharge: 2009-11-28 | Payer: Self-pay | Admitting: Emergency Medicine

## 2009-12-03 ENCOUNTER — Emergency Department (HOSPITAL_COMMUNITY): Admission: EM | Admit: 2009-12-03 | Discharge: 2009-12-04 | Payer: Self-pay | Admitting: Emergency Medicine

## 2009-12-07 ENCOUNTER — Emergency Department (HOSPITAL_COMMUNITY): Admission: EM | Admit: 2009-12-07 | Discharge: 2009-12-08 | Payer: Self-pay | Admitting: Emergency Medicine

## 2010-01-26 ENCOUNTER — Emergency Department (HOSPITAL_COMMUNITY): Admission: EM | Admit: 2010-01-26 | Discharge: 2010-01-27 | Payer: Self-pay | Admitting: Emergency Medicine

## 2010-02-02 ENCOUNTER — Emergency Department (HOSPITAL_COMMUNITY): Admission: EM | Admit: 2010-02-02 | Discharge: 2010-02-03 | Payer: Self-pay | Admitting: Emergency Medicine

## 2010-02-03 ENCOUNTER — Emergency Department (HOSPITAL_COMMUNITY): Admission: EM | Admit: 2010-02-03 | Discharge: 2010-02-03 | Payer: Self-pay | Admitting: Emergency Medicine

## 2010-03-29 ENCOUNTER — Emergency Department (HOSPITAL_COMMUNITY): Admission: EM | Admit: 2010-03-29 | Discharge: 2010-03-29 | Payer: Self-pay | Admitting: Emergency Medicine

## 2010-07-11 ENCOUNTER — Emergency Department (HOSPITAL_COMMUNITY): Admission: EM | Admit: 2010-07-11 | Discharge: 2010-07-11 | Payer: Self-pay | Admitting: Emergency Medicine

## 2010-07-12 ENCOUNTER — Emergency Department (HOSPITAL_COMMUNITY): Admission: EM | Admit: 2010-07-12 | Discharge: 2010-07-13 | Payer: Self-pay | Admitting: Emergency Medicine

## 2010-08-25 ENCOUNTER — Emergency Department (HOSPITAL_COMMUNITY): Admission: EM | Admit: 2010-08-25 | Discharge: 2010-08-26 | Payer: Self-pay | Admitting: Emergency Medicine

## 2010-08-31 ENCOUNTER — Emergency Department (HOSPITAL_COMMUNITY): Admission: EM | Admit: 2010-08-31 | Discharge: 2010-08-31 | Payer: Self-pay | Admitting: Emergency Medicine

## 2010-10-06 ENCOUNTER — Emergency Department (HOSPITAL_COMMUNITY)
Admission: EM | Admit: 2010-10-06 | Discharge: 2010-10-06 | Payer: Self-pay | Source: Home / Self Care | Admitting: Emergency Medicine

## 2010-10-27 ENCOUNTER — Emergency Department (HOSPITAL_BASED_OUTPATIENT_CLINIC_OR_DEPARTMENT_OTHER)
Admission: EM | Admit: 2010-10-27 | Discharge: 2010-10-27 | Payer: Self-pay | Source: Home / Self Care | Admitting: Emergency Medicine

## 2010-10-28 LAB — URINALYSIS, ROUTINE W REFLEX MICROSCOPIC
Bilirubin Urine: NEGATIVE
Ketones, ur: NEGATIVE mg/dL
Leukocytes, UA: NEGATIVE
Nitrite: POSITIVE — AB
Protein, ur: NEGATIVE mg/dL
Specific Gravity, Urine: 1.043 — ABNORMAL HIGH (ref 1.005–1.030)
Urine Glucose, Fasting: >1000 mg/dL — AB
Urobilinogen, UA: 1 mg/dL (ref 0.0–1.0)
pH: 7 (ref 5.0–8.0)

## 2010-10-28 LAB — PREGNANCY, URINE: Preg Test, Ur: NEGATIVE

## 2010-10-28 LAB — URINE MICROSCOPIC-ADD ON

## 2010-10-28 LAB — GLUCOSE, CAPILLARY: Glucose-Capillary: 279 mg/dL — ABNORMAL HIGH (ref 70–99)

## 2010-11-01 ENCOUNTER — Encounter: Payer: Self-pay | Admitting: *Deleted

## 2010-11-01 ENCOUNTER — Encounter: Payer: Self-pay | Admitting: Emergency Medicine

## 2010-11-02 ENCOUNTER — Encounter: Payer: Self-pay | Admitting: Internal Medicine

## 2010-12-17 ENCOUNTER — Emergency Department (HOSPITAL_COMMUNITY)
Admission: EM | Admit: 2010-12-17 | Discharge: 2010-12-17 | Disposition: A | Discharge status: Home / Self Care | Payer: Self-pay | Attending: Emergency Medicine | Admitting: Emergency Medicine

## 2010-12-17 DIAGNOSIS — Z0389 Encounter for observation for other suspected diseases and conditions ruled out: Secondary | ICD-10-CM | POA: Insufficient documentation

## 2010-12-22 LAB — URINALYSIS, ROUTINE W REFLEX MICROSCOPIC
Bilirubin Urine: NEGATIVE
Glucose, UA: >1000 mg/dL — AB
Hgb urine dipstick: NEGATIVE
Ketones, ur: NEGATIVE mg/dL
Specific Gravity, Urine: 1.041 — ABNORMAL HIGH (ref 1.005–1.030)
pH: 5 (ref 5.0–8.0)

## 2010-12-22 LAB — DIFFERENTIAL
Basophils Absolute: 0.1 10*3/uL (ref 0.0–0.1)
Basophils Relative: 1 % (ref 0–1)
Eosinophils Relative: 4 % (ref 0–5)
Monocytes Absolute: 0.5 10*3/uL (ref 0.1–1.0)

## 2010-12-22 LAB — BASIC METABOLIC PANEL
BUN: 7 mg/dL (ref 6–23)
CO2: 29 mEq/L (ref 19–32)
Chloride: 99 mEq/L (ref 96–112)
Glucose, Bld: 355 mg/dL — ABNORMAL HIGH (ref 70–99)
Potassium: 3.9 mEq/L (ref 3.5–5.1)

## 2010-12-22 LAB — CBC
HCT: 41.8 % (ref 36.0–46.0)
MCH: 32.1 pg (ref 26.0–34.0)
MCHC: 35.3 g/dL (ref 30.0–36.0)
MCV: 90.9 fL (ref 78.0–100.0)
RDW: 12.7 % (ref 11.5–15.5)

## 2010-12-22 LAB — PREGNANCY, URINE: Preg Test, Ur: NEGATIVE

## 2010-12-22 LAB — URINE MICROSCOPIC-ADD ON

## 2010-12-29 LAB — CBC
HCT: 43.7 % (ref 36.0–46.0)
Hemoglobin: 13.6 g/dL (ref 12.0–15.0)
Hemoglobin: 14.9 g/dL (ref 12.0–15.0)
MCHC: 34.4 g/dL (ref 30.0–36.0)
MCV: 93.5 fL (ref 78.0–100.0)
MCV: 94.3 fL (ref 78.0–100.0)
Platelets: 234 10*3/uL (ref 150–400)
RBC: 4.21 MIL/uL (ref 3.87–5.11)
WBC: 11.2 10*3/uL — ABNORMAL HIGH (ref 4.0–10.5)
WBC: 8.2 10*3/uL (ref 4.0–10.5)

## 2010-12-29 LAB — BASIC METABOLIC PANEL
BUN: 10 mg/dL (ref 6–23)
CO2: 26 mEq/L (ref 19–32)
Chloride: 99 mEq/L (ref 96–112)
Chloride: 99 mEq/L (ref 96–112)
GFR calc Af Amer: >60 mL/min (ref >60)
Potassium: 3.5 mEq/L (ref 3.5–5.1)
Potassium: 3.7 mEq/L (ref 3.5–5.1)
Sodium: 134 mEq/L — ABNORMAL LOW (ref 135–145)
Sodium: 136 mEq/L (ref 135–145)

## 2010-12-29 LAB — GLUCOSE, CAPILLARY: Glucose-Capillary: 368 mg/dL — ABNORMAL HIGH (ref 70–99)

## 2010-12-29 LAB — DIFFERENTIAL
Basophils Relative: 1 % (ref 0–1)
Eosinophils Absolute: 0.5 10*3/uL (ref 0.0–0.7)
Eosinophils Absolute: 0.5 10*3/uL (ref 0.0–0.7)
Eosinophils Relative: 5 % (ref 0–5)
Lymphocytes Relative: 46 % (ref 12–46)
Lymphs Abs: 4 10*3/uL (ref 0.7–4.0)
Lymphs Abs: 5.2 10*3/uL — ABNORMAL HIGH (ref 0.7–4.0)
Monocytes Absolute: 0.5 10*3/uL (ref 0.1–1.0)
Monocytes Absolute: 0.6 10*3/uL (ref 0.1–1.0)
Monocytes Relative: 6 % (ref 3–12)
Monocytes Relative: 6 % (ref 3–12)
Neutrophils Relative %: 38 % — ABNORMAL LOW (ref 43–77)

## 2010-12-29 LAB — URINE MICROSCOPIC-ADD ON

## 2010-12-29 LAB — URINALYSIS, ROUTINE W REFLEX MICROSCOPIC
Ketones, ur: NEGATIVE mg/dL
Leukocytes, UA: NEGATIVE
Nitrite: NEGATIVE
Specific Gravity, Urine: >1.046 — ABNORMAL HIGH (ref 1.005–1.030)
Urobilinogen, UA: 0.2 mg/dL (ref 0.0–1.0)
pH: 5.5 (ref 5.0–8.0)

## 2010-12-29 LAB — PREGNANCY, URINE: Preg Test, Ur: NEGATIVE

## 2010-12-29 LAB — POCT CARDIAC MARKERS: Troponin i, poc: <0.05 ng/mL (ref 0.00–0.09)

## 2010-12-30 LAB — LACTIC ACID, PLASMA: Lactic Acid, Venous: 2.4 mmol/L — ABNORMAL HIGH (ref 0.5–2.2)

## 2010-12-30 LAB — POCT I-STAT, CHEM 8
BUN: 8 mg/dL (ref 6–23)
Calcium, Ion: 1.15 mmol/L (ref 1.12–1.32)
Chloride: 100 mEq/L (ref 96–112)
HCT: 42 % (ref 36.0–46.0)
Sodium: 138 mEq/L (ref 135–145)
TCO2: 29 mmol/L (ref 0–100)

## 2010-12-30 LAB — GLUCOSE, CAPILLARY
Glucose-Capillary: 281 mg/dL — ABNORMAL HIGH (ref 70–99)
Glucose-Capillary: 317 mg/dL — ABNORMAL HIGH (ref 70–99)
Glucose-Capillary: 322 mg/dL — ABNORMAL HIGH (ref 70–99)
Glucose-Capillary: 412 mg/dL — ABNORMAL HIGH (ref 70–99)

## 2011-01-14 ENCOUNTER — Emergency Department (HOSPITAL_COMMUNITY)
Admission: EM | Admit: 2011-01-14 | Discharge: 2011-01-14 | Discharge status: Against Medical Advice | Payer: Self-pay | Attending: Emergency Medicine | Admitting: Emergency Medicine

## 2011-01-14 DIAGNOSIS — M7989 Other specified soft tissue disorders: Secondary | ICD-10-CM | POA: Insufficient documentation

## 2011-01-15 LAB — URINE MICROSCOPIC-ADD ON

## 2011-01-15 LAB — URINALYSIS, ROUTINE W REFLEX MICROSCOPIC
Bilirubin Urine: NEGATIVE
Glucose, UA: >1000 mg/dL — AB
Ketones, ur: NEGATIVE mg/dL
Specific Gravity, Urine: 1.043 — ABNORMAL HIGH (ref 1.005–1.030)
pH: 5 (ref 5.0–8.0)

## 2011-01-15 LAB — POCT PREGNANCY, URINE: Preg Test, Ur: NEGATIVE

## 2011-01-16 LAB — COMPREHENSIVE METABOLIC PANEL
ALT: 8 U/L (ref 0–35)
AST: 14 U/L (ref 0–37)
Alkaline Phosphatase: 53 U/L (ref 39–117)
CO2: 26 mEq/L (ref 19–32)
Calcium: 9 mg/dL (ref 8.4–10.5)
Chloride: 104 mEq/L (ref 96–112)
GFR calc Af Amer: >60 mL/min (ref >60)
GFR calc non Af Amer: >60 mL/min (ref >60)
Potassium: 3.9 mEq/L (ref 3.5–5.1)
Sodium: 137 mEq/L (ref 135–145)

## 2011-01-16 LAB — URINE MICROSCOPIC-ADD ON

## 2011-01-16 LAB — LIPASE, BLOOD: Lipase: 43 U/L (ref 11–59)

## 2011-01-16 LAB — CBC
Hemoglobin: 14.4 g/dL (ref 12.0–15.0)
MCHC: 34.5 g/dL (ref 30.0–36.0)
RBC: 4.45 MIL/uL (ref 3.87–5.11)
WBC: 11.8 10*3/uL — ABNORMAL HIGH (ref 4.0–10.5)

## 2011-01-16 LAB — DIFFERENTIAL
Basophils Relative: 0 % (ref 0–1)
Eosinophils Absolute: 0.6 10*3/uL (ref 0.0–0.7)
Eosinophils Relative: 5 % (ref 0–5)
Lymphs Abs: 4 10*3/uL (ref 0.7–4.0)

## 2011-01-16 LAB — URINALYSIS, ROUTINE W REFLEX MICROSCOPIC
Bilirubin Urine: NEGATIVE
Hgb urine dipstick: NEGATIVE
Specific Gravity, Urine: 1.039 — ABNORMAL HIGH (ref 1.005–1.030)
Urobilinogen, UA: 0.2 mg/dL (ref 0.0–1.0)
pH: 5 (ref 5.0–8.0)

## 2011-01-17 LAB — BLOOD GAS, ARTERIAL
Drawn by: 317871
FIO2: 0.21 %
pCO2 arterial: 28 mmHg — ABNORMAL LOW (ref 35.0–45.0)
pH, Arterial: 7.486 — ABNORMAL HIGH (ref 7.350–7.400)
pO2, Arterial: 115 mmHg — ABNORMAL HIGH (ref 80.0–100.0)

## 2011-01-17 LAB — GLUCOSE, CAPILLARY: Glucose-Capillary: 288 mg/dL — ABNORMAL HIGH (ref 70–99)

## 2011-01-17 LAB — POCT PREGNANCY, URINE: Preg Test, Ur: NEGATIVE

## 2011-01-17 LAB — POCT I-STAT, CHEM 8
BUN: 7 mg/dL (ref 6–23)
Creatinine, Ser: 0.7 mg/dL (ref 0.4–1.2)
Hemoglobin: 14.3 g/dL (ref 12.0–15.0)
Potassium: 3.5 mEq/L (ref 3.5–5.1)
TCO2: 22 mmol/L (ref 0–100)

## 2011-01-17 LAB — CBC
HCT: 43.1 % (ref 36.0–46.0)
Platelets: 211 10*3/uL (ref 150–400)
RDW: 12 % (ref 11.5–15.5)

## 2011-01-17 LAB — RAPID URINE DRUG SCREEN, HOSP PERFORMED: Tetrahydrocannabinol: NOT DETECTED

## 2011-01-17 LAB — DIFFERENTIAL
Basophils Absolute: 0 10*3/uL (ref 0.0–0.1)
Eosinophils Relative: 4 % (ref 0–5)
Lymphocytes Relative: 34 % (ref 12–46)
Neutro Abs: 7.4 10*3/uL (ref 1.7–7.7)

## 2011-01-17 LAB — POCT CARDIAC MARKERS: CKMB, poc: <1 ng/mL — ABNORMAL LOW (ref 1.0–8.0)

## 2011-01-17 LAB — D-DIMER, QUANTITATIVE: D-Dimer, Quant: 0.22 ug/mL-FEU (ref 0.00–0.48)

## 2011-01-17 LAB — ETHANOL: Alcohol, Ethyl (B): <5 mg/dL (ref 0–10)

## 2011-01-18 LAB — CBC
HCT: 43.1 % (ref 36.0–46.0)
MCV: 91.8 fL (ref 78.0–100.0)
RBC: 4.69 MIL/uL (ref 3.87–5.11)
WBC: 9.2 10*3/uL (ref 4.0–10.5)

## 2011-01-18 LAB — POCT CARDIAC MARKERS
Myoglobin, poc: 34.8 ng/mL (ref 12–200)
Myoglobin, poc: 48 ng/mL (ref 12–200)
Troponin i, poc: <0.05 ng/mL (ref 0.00–0.09)

## 2011-01-18 LAB — POCT I-STAT, CHEM 8
BUN: 6 mg/dL (ref 6–23)
BUN: 7 mg/dL (ref 6–23)
Calcium, Ion: 1.11 mmol/L — ABNORMAL LOW (ref 1.12–1.32)
Chloride: 109 mEq/L (ref 96–112)
Creatinine, Ser: 0.8 mg/dL (ref 0.4–1.2)
HCT: 44 % (ref 36.0–46.0)
Sodium: 134 mEq/L — ABNORMAL LOW (ref 135–145)
TCO2: 18 mmol/L (ref 0–100)
TCO2: 19 mmol/L (ref 0–100)

## 2011-01-18 LAB — D-DIMER, QUANTITATIVE: D-Dimer, Quant: <0.22 ug/mL-FEU (ref 0.00–0.48)

## 2011-01-19 LAB — GLUCOSE, CAPILLARY: Glucose-Capillary: 346 mg/dL — ABNORMAL HIGH (ref 70–99)

## 2011-01-20 LAB — POCT I-STAT, CHEM 8
Calcium, Ion: 1.2 mmol/L (ref 1.12–1.32)
Creatinine, Ser: 0.6 mg/dL (ref 0.4–1.2)
Glucose, Bld: 352 mg/dL — ABNORMAL HIGH (ref 70–99)
HCT: 46 % (ref 36.0–46.0)
Hemoglobin: 15.6 g/dL — ABNORMAL HIGH (ref 12.0–15.0)
TCO2: 26 mmol/L (ref 0–100)

## 2011-01-20 LAB — DIFFERENTIAL
Basophils Absolute: 0 10*3/uL (ref 0.0–0.1)
Basophils Relative: 0 % (ref 0–1)
Eosinophils Absolute: 0.5 10*3/uL (ref 0.0–0.7)
Eosinophils Relative: 6 % — ABNORMAL HIGH (ref 0–5)
Monocytes Absolute: 0.5 10*3/uL (ref 0.1–1.0)
Monocytes Relative: 6 % (ref 3–12)
Neutro Abs: 4.5 10*3/uL (ref 1.7–7.7)

## 2011-01-20 LAB — CBC
Hemoglobin: 15.5 g/dL — ABNORMAL HIGH (ref 12.0–15.0)
MCHC: 35.3 g/dL (ref 30.0–36.0)
MCV: 92.9 fL (ref 78.0–100.0)
RDW: 12.3 % (ref 11.5–15.5)

## 2011-01-21 LAB — GC/CHLAMYDIA PROBE AMP, GENITAL
Chlamydia, DNA Probe: NEGATIVE
GC Probe Amp, Genital: NEGATIVE

## 2011-01-21 LAB — URINALYSIS, ROUTINE W REFLEX MICROSCOPIC
Hgb urine dipstick: NEGATIVE
Nitrite: NEGATIVE
Protein, ur: NEGATIVE mg/dL
Specific Gravity, Urine: >1.046 — ABNORMAL HIGH (ref 1.005–1.030)
Urobilinogen, UA: 1 mg/dL (ref 0.0–1.0)

## 2011-01-21 LAB — PREGNANCY, URINE: Preg Test, Ur: NEGATIVE

## 2011-01-21 LAB — WET PREP, GENITAL: WBC, Wet Prep HPF POC: NONE SEEN

## 2011-01-21 LAB — POCT I-STAT, CHEM 8
BUN: 6 mg/dL (ref 6–23)
Calcium, Ion: 1.09 mmol/L — ABNORMAL LOW (ref 1.12–1.32)
Creatinine, Ser: 0.7 mg/dL (ref 0.4–1.2)
Hemoglobin: 15 g/dL (ref 12.0–15.0)
Sodium: 138 mEq/L (ref 135–145)
TCO2: 26 mmol/L (ref 0–100)

## 2011-01-21 LAB — GLUCOSE, CAPILLARY

## 2011-01-21 LAB — URINE MICROSCOPIC-ADD ON: Urine-Other: NONE SEEN

## 2011-01-25 LAB — BASIC METABOLIC PANEL
BUN: 11 mg/dL (ref 6–23)
CO2: 24 mEq/L (ref 19–32)
Chloride: 101 mEq/L (ref 96–112)
GFR calc non Af Amer: >60 mL/min (ref >60)
GFR calc non Af Amer: >60 mL/min (ref >60)
Glucose, Bld: 358 mg/dL — ABNORMAL HIGH (ref 70–99)
Potassium: 4.2 mEq/L (ref 3.5–5.1)
Potassium: 3.7 mEq/L (ref 3.5–5.1)
Sodium: 137 mEq/L (ref 135–145)

## 2011-01-25 LAB — DIFFERENTIAL
Basophils Absolute: 0 10*3/uL (ref 0.0–0.1)
Basophils Relative: 0 % (ref 0–1)
Eosinophils Absolute: 0.5 10*3/uL (ref 0.0–0.7)
Eosinophils Absolute: 0.5 10*3/uL (ref 0.0–0.7)
Eosinophils Absolute: 0.5 10*3/uL (ref 0.0–0.7)
Eosinophils Relative: 6 % — ABNORMAL HIGH (ref 0–5)
Eosinophils Relative: 5 % (ref 0–5)
Eosinophils Relative: 6 % — ABNORMAL HIGH (ref 0–5)
Lymphocytes Relative: 40 % (ref 12–46)
Lymphocytes Relative: 41 % (ref 12–46)
Lymphs Abs: 3.5 10*3/uL (ref 0.7–4.0)
Lymphs Abs: 4.1 10*3/uL — ABNORMAL HIGH (ref 0.7–4.0)
Monocytes Absolute: 0.4 10*3/uL (ref 0.1–1.0)
Monocytes Absolute: 0.6 10*3/uL (ref 0.1–1.0)
Monocytes Absolute: 0.5 10*3/uL (ref 0.1–1.0)
Monocytes Relative: 4 % (ref 3–12)

## 2011-01-25 LAB — COMPREHENSIVE METABOLIC PANEL
ALT: 8 U/L (ref 0–35)
AST: 13 U/L (ref 0–37)
Albumin: 3.2 g/dL — ABNORMAL LOW (ref 3.5–5.2)
CO2: 29 mEq/L (ref 19–32)
Calcium: 9.1 mg/dL (ref 8.4–10.5)
Chloride: 101 mEq/L (ref 96–112)
GFR calc Af Amer: >60 mL/min (ref >60)
GFR calc non Af Amer: >60 mL/min (ref >60)
Sodium: 138 mEq/L (ref 135–145)
Total Bilirubin: 0.5 mg/dL (ref 0.3–1.2)

## 2011-01-25 LAB — GLUCOSE, CAPILLARY
Glucose-Capillary: 298 mg/dL — ABNORMAL HIGH (ref 70–99)
Glucose-Capillary: 299 mg/dL — ABNORMAL HIGH (ref 70–99)

## 2011-01-25 LAB — URINALYSIS, ROUTINE W REFLEX MICROSCOPIC
Bilirubin Urine: NEGATIVE
Glucose, UA: >1000 mg/dL — AB
Hgb urine dipstick: NEGATIVE
Leukocytes, UA: NEGATIVE
Protein, ur: NEGATIVE mg/dL
pH: 6.5 (ref 5.0–8.0)

## 2011-01-25 LAB — LIPASE, BLOOD: Lipase: 58 U/L (ref 11–59)

## 2011-01-25 LAB — CBC
HCT: 45.3 % (ref 36.0–46.0)
HCT: 43.1 % (ref 36.0–46.0)
Hemoglobin: 15.5 g/dL — ABNORMAL HIGH (ref 12.0–15.0)
MCHC: 35.1 g/dL (ref 30.0–36.0)
MCV: 93.1 fL (ref 78.0–100.0)
MCV: 91.2 fL (ref 78.0–100.0)
Platelets: 216 10*3/uL (ref 150–400)
RBC: 4.27 MIL/uL (ref 3.87–5.11)
RDW: 12.1 % (ref 11.5–15.5)
WBC: 8.6 10*3/uL (ref 4.0–10.5)
WBC: 9.8 10*3/uL (ref 4.0–10.5)

## 2011-01-25 LAB — RAPID URINE DRUG SCREEN, HOSP PERFORMED: Barbiturates: NOT DETECTED

## 2011-01-25 LAB — TRICYCLICS SCREEN, URINE: TCA Scrn: NOT DETECTED

## 2011-01-26 LAB — COMPREHENSIVE METABOLIC PANEL
ALT: 8 U/L (ref 0–35)
AST: 14 U/L (ref 0–37)
Alkaline Phosphatase: 69 U/L (ref 39–117)
Calcium: 9.5 mg/dL (ref 8.4–10.5)
GFR calc Af Amer: >60 mL/min (ref >60)
Glucose, Bld: 308 mg/dL — ABNORMAL HIGH (ref 70–99)
Potassium: 3.9 mEq/L (ref 3.5–5.1)
Sodium: 136 mEq/L (ref 135–145)
Total Protein: 8.2 g/dL (ref 6.0–8.3)

## 2011-01-26 LAB — URINALYSIS, ROUTINE W REFLEX MICROSCOPIC
Bilirubin Urine: NEGATIVE
Glucose, UA: >1000 mg/dL — AB
Glucose, UA: >1000 mg/dL — AB
Hgb urine dipstick: NEGATIVE
Hgb urine dipstick: NEGATIVE
Leukocytes, UA: NEGATIVE
Protein, ur: NEGATIVE mg/dL
Protein, ur: NEGATIVE mg/dL
Specific Gravity, Urine: 1.042 — ABNORMAL HIGH (ref 1.005–1.030)
Urobilinogen, UA: 1 mg/dL (ref 0.0–1.0)
pH: 6.5 (ref 5.0–8.0)

## 2011-01-26 LAB — URINE MICROSCOPIC-ADD ON

## 2011-01-26 LAB — DIFFERENTIAL
Basophils Relative: 1 % (ref 0–1)
Eosinophils Absolute: 0.5 10*3/uL (ref 0.0–0.7)
Eosinophils Relative: 5 % (ref 0–5)
Lymphs Abs: 3.8 10*3/uL (ref 0.7–4.0)
Monocytes Absolute: 0.4 10*3/uL (ref 0.1–1.0)
Monocytes Relative: 4 % (ref 3–12)

## 2011-01-26 LAB — GLUCOSE, CAPILLARY
Glucose-Capillary: 209 mg/dL — ABNORMAL HIGH (ref 70–99)
Glucose-Capillary: 368 mg/dL — ABNORMAL HIGH (ref 70–99)

## 2011-01-26 LAB — CBC
Hemoglobin: 16.6 g/dL — ABNORMAL HIGH (ref 12.0–15.0)
MCHC: 34.8 g/dL (ref 30.0–36.0)
RBC: 5.16 MIL/uL — ABNORMAL HIGH (ref 3.87–5.11)
RDW: 11.8 % (ref 11.5–15.5)

## 2011-01-26 LAB — POCT PREGNANCY, URINE: Preg Test, Ur: NEGATIVE

## 2011-02-23 NOTE — Group Therapy Note (Signed)
NAME:  Kathy Phelps, Kathy Phelps NO.:  [id number]   MEDICAL RECORD NO.:  [id number]          PATIENT TYPE:  WOC   LOCATION:  [facility]                   FACILITY:  [facility]   PHYSICIAN:  Emeterio Reeve, MD       DATE OF BIRTH:  [birth year]   DATE OF SERVICE:                                  CLINIC NOTE   CHIEF COMPLAINT:  Prolonged bleeding.   HISTORY OF PRESENT ILLNESS:  The patient is a 30 year old female gravida  4, para 4, last menstrual period began a month ago and continues today.  She has moderate flow and use about 3 pads a day.  She also says she has  not had her pad over the last 2 weeks and she has dyspareunia.  She  denies any abnormal discharge or fever.   PAST MEDICAL HISTORY:  1. Type 1 diabetes, which is uncontrolled.  2. Asthma.  3. Hypertension.  4. Bipolar disorder.  5. Depression.  6. Hyperlipidemia.   FAMILY HISTORY:  Cervical cancer in her mother who died of complications  of her disease.  Also, diabetes and hypertension in the family.   PAST SURGICAL HISTORY:  Cesarean section, cholecystectomy, and tubal  ligation.   SOCIAL HISTORY:  The patient says she has been with same partner for 6  years and her marital relationship is mutually monogamous.  She smokes 2  packs a day.  She denies alcohol or drug use.  She says that she has  considered tubal reanastomosis, should be able to see you again.   ALLERGIES:  She has allergy to [facility], which causes hives.   MEDICATIONS:  1. Lexapro 10 mg a day.  2. Lipitor 25 mg a day.  3. Metformin 1500 mg twice a day.  4. Reglan 10 mg p.r.n. nausea with migraine.  5. Baby aspirin a day.  6. Lantus insulin 50 units at bedtime.   PHYSICAL EXAMINATION:  GENERAL:  The patient in no acute distress, has  fairly normal affect and awake.  VITAL SIGNS:  Weight is 186.5 pounds, height 5 feet 9 inches, blood  pressure 117/82, pulse 74, and temperature is 97.3.  ABDOMEN:  Soft, nontender, and nondistended.  No mass.  PELVIC:  External genitalia and vagina showed moderate dark menstrual  flow.  CERVIX:  Grossly normal.  UTERUS:  Normal, mildly tender.  No adnexal masses.   LABORATORY DATA:  From her visit to ER at [facility] showed a glucose  of 321, BUN 6, creatinine 0.7, hemoglobin 15.0.  Urinalysis was not  consistent with UTI.  Pelvic exam was done at that time, which showed  some mild tenderness at the uterus.  No cervical motion tenderness.   IMPRESSION:  1. Dysfunctional uterine bleeding and some pelvic pain with      dyspareunia.  2. Uncontrolled diabetes.  3. Status post tubal ligation with some desire to possibly have a      tubal reanastomosis in the future.   PLAN:  The patient will have a pelvic ultrasound scheduled.  She does  have some tenderness and pelvic pain in the last 2 weeks.  She may  have  some endometritis, so I gave her prescription for doxycycline 100 mg  p.o. b.i.d. 10 days.  She will return in 3 weeks to review her progress.      Emeterio Reeve, MD     JA/MEDQ  D:  01/02/2009  T:  01/03/2009  Job:  [id number]

## 2011-02-23 NOTE — Discharge Summary (Signed)
NAME:  Kathy Phelps, Kathy Phelps               ACCOUNT NO.:  [id number]   MEDICAL RECORD NO.:  [id number]          PATIENT TYPE:  IPS   LOCATION:  [id number]                          FACILITY:  [facility]   PHYSICIAN:  Carlton Adam, M.D.      DATE OF BIRTH:  [birth year]   DATE OF ADMISSION:  11/08/2008  DATE OF DISCHARGE:  11/08/2008                               DISCHARGE SUMMARY   IDENTIFYING INFORMATION:  This is a 30 year old Caucasian female.  This  is an involuntary admission.   HISTORY OF THE PRESENT ILLNESS:  First inpatient psychiatric referral  for this 30 year old mother of 3 who initially came to the emergency  room yesterday evening after having a disagreement with her husband over  her hydrocodone.  She reports that he was afraid she was going to harm  herself by taking 2 of her hydrocodone tablets 5/325 mg instead of what  he thought she should take which was 1.  He felt that that was not safe  and got upset with her, took the hydrocodone away from her, and flushed  it down the toilet.  She denies that she had any intent to harm herself.  Denies any mood disorder.  She has been taking Lexapro 5 mg for some  depression following the death of her mother last year.  No history of  previous suicide attempts.  She categorically denies any history of  suicidal thoughts.  She says her husband is Hispanic and does not read  or write or speak English and was not able to read the medication label  the way it was prescribed.  Phone conference with a friend of hers who  brought her to the emergency room indicated that her friend had known  her for 6 years, never known her to do anything dangerous or to have any  suicidal thoughts.  Diagnostic studies in the emergency room revealed a  urine drug screen negative for all substances.  She was negative for  alcohol and she denies any current or past substance abuse.  She was  going to take the hydrocodone because she had been outside playing in  the park on  November 07, 2008, with her 3 children and her left ankle was  starting to bother her after a recent sprain.   MEDICATIONS AT THE TIME OF ADMISSION:  1. Insulin 75/25 mix 25 units q.a.m.  2. Metformin 1500 mg q.a.m.  3. Lipitor 20 mg daily.  4. Lexapro 5 mg daily.  5. Hydrocodone 5/325 mg 1 to 2 tabs every 6 hours p.r.n. pain      following a recent left ankle injury.   MEDICAL PROBLEMS:  1. Diabetes.  2. Dyslipidemia for which she is followed at [facility].   MENTAL STATUS EXAM:  Today reveals a pleasant, cooperative, healthy-  appearing 30 year old in no distress.  She is somewhat irritable,  feeling that in the emergency room no one was listening to her.  Categorically denies any suicidal thoughts.  Speech is normal.  She is  oriented x4.  No signs of delirium or  confusion, in full contact with  reality.  Speech is normal in pace, tone, and form.  Gives a coherent  history.  Thinking is linear, goal directed.  Asks appropriate  questions.  Cooperative with staff and with assessment today.  Immediate, recent, and remote memory are intact.  Cognitive function is  intact.  Intelligence average.  AXIS I:  Depressive disorder, NOS.  AXIS II:  No diagnosis.  AXIS III:  Diabetes mellitus, type 2.  Dyslipidemia.  AXIS IV:  Recent conflict with spouse.  AXIS V: Current 30, past year 68 to 73.   PLAN:  Is to discharge her today.  No changes have been made in the  medications.  She is released from her involuntary petition, does not  require followup other than following up with her primary care physician  as previously scheduled.      Margaret A. Scott, N.P.      Carlton Adam, M.D.  Electronically Signed    MAS/MEDQ  D:  11/08/2008  T:  11/08/2008  Job:  [id number]

## 2011-02-24 ENCOUNTER — Emergency Department (HOSPITAL_COMMUNITY): Payer: Self-pay

## 2011-02-24 ENCOUNTER — Emergency Department (HOSPITAL_COMMUNITY)
Admission: EM | Admit: 2011-02-24 | Discharge: 2011-02-24 | Disposition: A | Discharge status: Home / Self Care | Payer: Self-pay | Attending: Emergency Medicine | Admitting: Emergency Medicine

## 2011-02-24 DIAGNOSIS — M79609 Pain in unspecified limb: Secondary | ICD-10-CM | POA: Insufficient documentation

## 2011-02-24 DIAGNOSIS — I10 Essential (primary) hypertension: Secondary | ICD-10-CM | POA: Insufficient documentation

## 2011-02-24 DIAGNOSIS — E119 Type 2 diabetes mellitus without complications: Secondary | ICD-10-CM | POA: Insufficient documentation

## 2011-02-24 DIAGNOSIS — M7989 Other specified soft tissue disorders: Secondary | ICD-10-CM | POA: Insufficient documentation

## 2011-02-24 DIAGNOSIS — F319 Bipolar disorder, unspecified: Secondary | ICD-10-CM | POA: Insufficient documentation

## 2011-02-24 DIAGNOSIS — Z91199 Patient's noncompliance with other medical treatment and regimen due to unspecified reason: Secondary | ICD-10-CM | POA: Insufficient documentation

## 2011-02-24 DIAGNOSIS — Z9119 Patient's noncompliance with other medical treatment and regimen: Secondary | ICD-10-CM | POA: Insufficient documentation

## 2011-02-24 DIAGNOSIS — E785 Hyperlipidemia, unspecified: Secondary | ICD-10-CM | POA: Insufficient documentation

## 2011-02-24 DIAGNOSIS — L02619 Cutaneous abscess of unspecified foot: Secondary | ICD-10-CM | POA: Insufficient documentation

## 2011-02-24 LAB — COMPREHENSIVE METABOLIC PANEL
ALT: <5 U/L (ref 0–35)
AST: 9 U/L (ref 0–37)
Albumin: 3.3 g/dL — ABNORMAL LOW (ref 3.5–5.2)
Alkaline Phosphatase: 67 U/L (ref 39–117)
Glucose, Bld: 246 mg/dL — ABNORMAL HIGH (ref 70–99)
Potassium: 3.9 mEq/L (ref 3.5–5.1)
Sodium: 135 mEq/L (ref 135–145)
Total Protein: 6.9 g/dL (ref 6.0–8.3)

## 2011-02-24 LAB — CBC
HCT: 40 % (ref 36.0–46.0)
RDW: 11.7 % (ref 11.5–15.5)
WBC: 10.2 10*3/uL (ref 4.0–10.5)

## 2011-02-24 LAB — DIFFERENTIAL
Basophils Absolute: 0 10*3/uL (ref 0.0–0.1)
Eosinophils Relative: 4 % (ref 0–5)
Lymphocytes Relative: 38 % (ref 12–46)
Neutro Abs: 5.3 10*3/uL (ref 1.7–7.7)

## 2011-02-26 NOTE — Op Note (Signed)
NAME:  Kathy Phelps, Kathy Phelps                          ACCOUNT NO.:  [id number]   MEDICAL RECORD NO.:  [id number]                   PATIENT TYPE:  INP   LOCATION:  9159                                 FACILITY:  [facility]   PHYSICIAN:  Tanya S. Kennon Rounds, M.D.                DATE OF BIRTH:  Aug 09, 1981   DATE OF PROCEDURE:  05/10/2003  DATE OF DISCHARGE:                                 OPERATIVE REPORT   PREOPERATIVE DIAGNOSES:  1. Intrauterine pregnancy at 27 and 1/7 weeks.  2. Severe pregnancy-induced hypertension.  3. Previous cesarean section.  4. Diabetes mellitus, class B, insulin dependent.  5. History of noncompliance.   POSTOPERATIVE DIAGNOSES:  1. Intrauterine pregnancy at 21 and 1/7 weeks.  2. Severe pregnancy-induced hypertension.  3. Previous cesarean section.  4. Diabetes mellitus, class B, insulin dependent.  5. History of noncompliance.   PROCEDURE:  Repeat low transverse cesarean section.   SURGEON:  Donnamae Jude, MD   ASSISTANT:  Candis Musa, C.N.M.   ANESTHESIA:  General with Heriberto Antigua, M.D.   FINDINGS:  Viable female infant, weight 7 pounds 3 ounces, Apgars 6, 7, and  8.   ESTIMATED BLOOD LOSS:  Less than 1000 mL.   COMPLICATIONS:  None.   SPECIMENS:  None.  Cord pH was 7.34.   REASON FOR PROCEDURE:  The patient is a 30 year old gravida 4, para 0-1-2-1,  at 50 and 1/7 weeks with history of noncompliance.  In fact, she has not  been seen in high risk clinic for the past four weeks.  She comes in today  complaining of epigastric pain.  She has a history of class B diabetes.  She  is insulin dependent as well as having history of HELLP in a previous  pregnancy with delivery by a C-section at 28 weeks.  On admission her blood  pressure was noted to be elevated and she was noted to have a platelet count  that dropped from 126 four days prior to this admission to 48,000.  Additionally her LFTs were abnormal with SGOT of 60.  Her creatinine was  also abnormal at  0.9 although this was where she tended to stay.  Uric acid  of note was normal at 3.4.  She did not have any protein in her urine.  Her  hemoglobin and hematocrit were noted to be normal.  The patient was also a  smoker.  After discussion with the patient that involved a trial of labor,  the unfavorability of her cervix which was closed, thick, and high, length  at time of induction, inability to obtain regional anesthesia, and a  possible worsening of maternal condition, it was decided to take her for  immediate cesarean section.   PROCEDURE:  The patient was taken to the OR where she was placed in a supine  position with a left lateral tilt.  She was prepped and draped  in the usual  sterile fashion and anesthesia was induced.  A Pfannenstiel incision was  made with a knife and carried down to the underlying fascia which was  divided sharply.  Two Kochers were used to then dissect the rectus off the  overlying fascia both superiorly and inferiorly.  This was found to be  rather adherent.  The peritoneal cavity was then entered and the entrance  into the cavity stretched to extend the incision.  At the uterus the bladder  was noticed to be attached and a low transverse incision was made just above  this.  This incision was carried down to the midline sharply and then  extended lateral using the surgeon's fingers.  The bag of water was ruptured  with an Allis clamp and clear fluid was noted.  The infant was found to be  in an ROT position.  It was rotated to OA for delivery.  The head was  delivered atraumatically.  It was bulb suctioned.  The rest of the body  delivered easily.  The cord was clamped x2 and the infant taken to the  awaiting pediatricians.  As noted previously this was a female infant with a  weight of 7 pounds 3 ounces, Apgars of 6,7, and 8.  Cord pH was 7.34.  Cord  gas and cord blood were obtained and the placenta was delivered from the  uterus.  The ovaries were very  adherent, especially anteriorly, and these  were removed with the Ring forceps and a clean sponge.  The sponge stick was  then placed through the cervix and opened to dilate the cervix then passed  off the field.  The edges of the uterine incision were grasped with the ring  forceps and the uterine incision closed with a locked running 1 Vicryl  suture.  A second imbricating layer of Lambert stitch was then used to  complete the closure.  Good hemostasis was noted and the abdominal cavity  was carefully irrigated and blood clots cleared from the gutters.  Again the  incision was looked at and found hemostatic.  At this time attention was  turned to the rectus muscles and superiorly there was significant bleeding  noted superiorly.  Laterally there was  a little bleeding at the patient's  right side which was cauterized with the electrocautery.  Inferiorly there  was some bleeding of the rectus muscles and this was cauterized with  electrocautery as well.  After this the rectus muscles were again generously  inspected and found to be hemostatic.  The fascia was then closed with 1  Vicryl suture in a running fashion after the fascial edges had been  cauterized as needed for hemostasis.  The subcuticular tissue was then  copiously irrigated and any bleeders were cauterized with the  electrocautery.  The skin was then closed using clips and then injected with  0.25% Marcaine plain along the incision line and laterally at the fascia.  All instrument, needle, and lap counts were correct x2.  The patient was  awakened and taken to recovery room in stable condition and the infant was  taken to the newborn nursery also in stable condition.                                               Standley Dakins. Kennon Rounds, M.D.    TSP/MEDQ  D:  05/10/2003  T:  05/10/2003  Job:  [id number]

## 2011-02-26 NOTE — Discharge Summary (Signed)
NAME:  Kathy Phelps, Kathy Phelps NO.:  [id number]   MEDICAL RECORD NO.:  [id number]          PATIENT TYPE:  INP   LOCATION:  9304                          FACILITY:  [facility]   PHYSICIAN:  Guss Bunde, M.D. DATE OF BIRTH:  [birth year]   DATE OF ADMISSION:  03/10/2005  DATE OF DISCHARGE:                                 DISCHARGE SUMMARY   HOSPITAL COURSE:  Lyndin is a 30 year old G6 para 2-1-2-3 at approximately 9  weeks estimated gestational age with a self-admission to the hospital. The  patient was told to be admitted over a week ago; however, she refused at  that time. She did show up for care on Mar 10, 2005. She was admitted at  that point. The main goal of the admission was to obtain glycemic control  and to evaluate if the patient had pyelonephritis. Her urinalysis showed  20,000 colony-forming units of E. coli. It was sensitive to cephalosporins  and Macrodantin. She was originally on IV medications. However, she refused  her IV. She has been placed on Vantin p.o. Once sensitivities came back we  switched her to [facility] and she will complete a 7-day course. She does not  have pyelonephritis. She has a normal white blood cell count and no CVA  tenderness. As for her diabetes, she refused insulin in the mornings,  fingersticks throughout the day. We do not have a handle on how her diabetes  is controlled. She did state to the Education officer, museum that she did not care if  she entered renal failure and needed dialysis. She said she would rather die  and never receive dialysis. At this point she does not have Medicaid and  refuses to take insulin on an outpatient basis. We will discharge her to  home and she is to follow up in high risk clinic. We told her it did not  matter if she had Medicaid, that we will see her regardless. Medicaid should  be retroactive when she does apply and cover these visits. We again stressed  the importance of getting prenatal care due to her high  risk nature of her  pregnancy, both for herself and her unborn child. The patient also advised  to quit smoking again. The patient has a high risk clinic appointment on  March 18, 2005 at 10 a.m.      KHL/MEDQ  D:  03/12/2005  T:  03/12/2005  Job:  [id number]

## 2011-02-26 NOTE — Op Note (Signed)
NAME:  Kathy Phelps, Kathy Phelps               ACCOUNT NO.:  [id number]   MEDICAL RECORD NO.:  [id number]          PATIENT TYPE:  INP   LOCATION:  9143                          FACILITY:  [facility]   PHYSICIAN:  Cranston Neighbor, M.D.DATE OF BIRTH:  April 20, 1981   DATE OF PROCEDURE:  07/09/2005  DATE OF DISCHARGE:                                 OPERATIVE REPORT   PROCEDURES:  1.  Cesarean section.  2.  Bilateral tubal ligation.   PREOPERATIVE DIAGNOSES:  1.  Intrauterine pregnancy with lung maturity proven by amniocentesis.  2.  History of three cesarean sections.   POSTOPERATIVE DIAGNOSES:  1.  Intrauterine pregnancy with lung maturity proven by amniocentesis.  2.  History of three cesarean sections.   PROCEDURES PERFORMED:  1.  __________ low transverse cesarean section via Pfannenstiel.  2.  Bilateral tubal ligation via [place]oy.   REASON FOR BILATERAL TUBAL LIGATION:  The patient desires permanent  sterilization.   SURGEON:  Cranston Neighbor, M.D.   ASSISTANTJanace Hoard B. Nigel Mormon, M.D.   ANESTHESIA:  General.   COMPLICATIONS:  None.   ESTIMATED BLOOD LOSS:  1200 mL.   FLUIDS:  2 L.   URINE OUTPUT:  125 mL of clear urine at end of procedure.   INDICATIONS:  A 30 year old G6, P2-1-2-3, who had fetal lung maturity proven  by amniocentesis with history of diabetes.   FINDINGS:  A female infant in cephalic presentation, Apgars of 5 and 9.  Normal uterus, tubes, and ovaries with adhesions.   PROCEDURE:  The patient was taken to the operating room and given general  anesthesia.  She was then prepped and draped in a sterile fashion in a  dorsal supine position with a leftward tilt.  A Pfannenstiel skin incision  was then made with a scalpel and carried through to the underlying layer of  fascia with the scalpel.  The fascia was incised in the midline and the  incision extended laterally with the Mayo scissors.  The superior aspect of  the fascial incision was then grasped with Kocher  clamps, elevated and  undermined, and the rectus muscle was dissected off.  The rectus muscle was  then separated in the midline and the peritoneum identified, tented up and  entered sharply with the Metzenbaum scissors.  The peritoneal incision was  then extended superiorly and inferiorly with good visualization of the  bladder.  The bladder blade was then inserted and the vesicouterine  peritoneum identified and grasped with the pick-ups and entered sharply with  Metzenbaum scissors.  The incision was then extended laterally and the  bladder flap created digitally.  The bladder flap was then reinserted and  the lower uterine segment incised in a transverse fashion with the scalpel.  The bladder blade was then removed and the infant's head delivered  atraumatically.  Nose and mouth were suctioned with a bulb syringe.  The  cord was clamped, infant was handed off to the waiting pediatrician.  Cord  blood was sent.  Cord gas 7.23.  The placenta was then removed manually, the  uterus exteriorized, cleared of all clots  and debris.  The uterine incision  was repaired with a running locked fashion.  A second layer of the same  suture was used to obtain excellent hemostasis.  The patient's right  fallopian tube was identified, grasped with a Babcock clamp.  A 3 cm segment  of tube was then ligated with a free tie, with free tie x2, and excised.  Good hemostasis was noted in the tube and the left fallopian tube was then  ligated and a 3 cm segment excised in a similar fashion.  Excellent  hemostasis was noted.  The bladder flap was then repaired and the uterus was  returned to the abdomen.  The gutters were cleared of all clots and the  peritoneum was closed.  The fascia was reapproximated.  The subcutaneous  tissue was reapproximated.  The skin was closed with staples.  The patient  tolerated the procedure well.  The sponge, lap and needle counts were  correct x2.  Ancef 1 g was given at cord  clamp.  The patient was taken to  the recovery room in stable condition.     ______________________________  Felix Pacini Nigel Mormon, MD    ______________________________  Cranston Neighbor, M.D.    ABC/MEDQ  D:  07/09/2005  T:  07/09/2005  Job:  [id number]

## 2011-02-26 NOTE — Discharge Summary (Signed)
NAME:  Kathy Phelps, Kathy Phelps               ACCOUNT NO.:  [id number]   MEDICAL RECORD NO.:  [id number]          PATIENT TYPE:  INP   LOCATION:  [id number]                          FACILITY:  [facility]   PHYSICIAN:  Tilman Neat, M.D.  DATE OF BIRTH:  1981/03/30   DATE OF ADMISSION:  12/02/2004  DATE OF DISCHARGE:  12/07/2004                                 DISCHARGE SUMMARY   DISCHARGE DIAGNOSES:  1.  A 6-week intrauterine pregnancy with diabetes mellitus type 2.  2.  Noncompliance and poorly controlled glycemia.  3.  Vaginal bleeding.  4.  History of cesarean section x2.   DISCHARGE MEDICATIONS:  1.  NPH insulin 44 units q.a.m., 20 units q.h.s.  2.  Regular insulin 22 units before breakfast, 60 units before dinner.  3.  Prenatal vitamin 1 p.o. q.d.   HISTORY OF PRESENT ILLNESS:  [name] was admitted to [facility]  with significant hyperglycemia.  At that time, she was having some minimal  spotting.   HOSPITAL COURSE:  Problem 1. Diabetes mellitus class B/C.  Ms. Kesler'  sugars were never ideal throughout her hospitalization.  For the most part,  they were in the high 100s except for the day prior to discharge when they  were in the mid to high 200s.  She would not comply with diet and was seen  eating fast food throughout her hospitalization.  The regimen that was  created for her was based on weight.  She likely will need this regimen  increased once we see how her sugars are doing as an outpatient.  However,  she does state she is going to be noncompliant with checking her sugars as  an outpatient.  She did have a PIH panel drawn while in the hospital which  showed a platelet count of 243, uric acid 4.0, AST 16, ALT 11, creatinine  0.8.  Her A1C was 9.4.  TSH was done which was normal at 0.852.  A 24-hour  urine was done which showed 338 mg of urine but her collection was only 1250  cc of urine.   Problem 2. Vaginal bleeding.  She had some minimal spotting several days  prior to  her discharge.  A quantitative was done on admission which was  noted to be 4327.  Three days after, her quantitative was 7533 and 2 days  following that, it was 9841.  Two ultrasounds were performed during the  hospitalization which showed intrauterine sac but no fetal pole.  She will  have another ultrasound done prior to her high risk clinic appointment in 2  days following discharge.  It was discussed with the patient that this may  be an abnormal pregnancy and that she may be having a spontaneous AB.  She  states if the pregnancy is abnormal, she certainly does not want to try to  keep the pregnancy but does not want a termination if it is a normal  pregnancy.   CONDITION ON DISCHARGE:  The patient was discharged to home in stable  condition.   DISCHARGE INSTRUCTIONS:  The patient was given a prescription for  a  Glucometer and was instructed as to when to check her sugars.  She was given  her diabetic regimen and was told to follow a diabetic diet.  She was  informed of the risks of hyperglycemia on pregnancy.  These include fetal  and maternal morbidity and mortality.      LC/MEDQ  D:  12/07/2004  T:  12/07/2004  Job:  [id number]   cc:   [facility], [facility]

## 2011-02-26 NOTE — Discharge Summary (Signed)
NAME:  Kathy Phelps, Kathy Phelps               ACCOUNT NO.:  [id number]   MEDICAL RECORD NO.:  [id number]          PATIENT TYPE:  INP   LOCATION:                                FACILITY:  [facility]   PHYSICIAN:  Tanya S. Kennon Rounds, M.D.   DATE OF BIRTH:  1980/11/20   DATE OF ADMISSION:  07/08/2005  DATE OF DISCHARGE:  07/11/2005                                 DISCHARGE SUMMARY   DISCHARGE DIAGNOSES:  1.  Repeat low transverse cesarean section at current pregnancy, delivered.  2.  Permanent sterilization with bilateral tubal ligation.  3.  Diabetes.  4.  Status post three previous cesarean sections.  5.  History of group B strep positive bacteruria in previous pregnancies.  6.  Gravida 6, para 2-2-2-4.   DISCHARGE MEDICATIONS:  1.  Ibuprofen 600 mg q.6h. p.r.n. #30 given.  2.  Vicodin 5/500 to use every four hours as needed #30 given.  No refills.  3.  Prenatal vitamins to continue daily for six weeks.   DISPOSITION:  The patient was discharged to home. Infant in NICU.   BRIEF ADMISSION HISTORY:  This is a 30 year old gravida 35, para 2-2-2-4, who  had been followed in high-risk clinic for diabetes, controlled, tobacco  abuse and history of three previous cesarean sections.  The patient had an  amniocentesis done and found mature fetal lung maturity and we decided to do  repeat section as scheduled.   HOSPITAL COURSE:  The patient was admitted.  Repeat low transverse section  was done on September 29.  Delivery of a female infant at 65, delivery of  placenta at 43.  Apgars at one minute were 5, and at five minutes 9.  Infant weighed 9 pounds 3 ounces.  Length 21-3/4 inches.  Dr. Hoy Morn  performed the procedure.  General anesthesia was used.  The patient's  estimated due date was August 02, 2005.  No postoperative complications.  At the time of discharge the patient was ambulating well, tolerating p.o.  and abdominal pain was controlled with p.o. medications.  Incision was  clean, dry and  intact.  Staple removal scheduled for Wednesday, October 4,  to the maternity admission unit.  Last CBG prior to discharge was fasting on  the morning of October 1, which was 154.  Discharge hemoglobin was 10.8.  The patient was not given any iron therapy.  The patient's blood type is A  positive. She is rubella immune.  Again GBS positive.      Marolyn Hammock, MD    ______________________________  Standley Dakins. Kennon Rounds, M.D.    AD/MEDQ  D:  07/11/2005  T:  07/12/2005  Job:  [id number]

## 2011-02-26 NOTE — Discharge Summary (Signed)
NAME:  ROSALIE, MIDDLEBROOK NO.:  [id number]   MEDICAL RECORD NO.:  [id number]            PATIENT TYPE:   LOCATION:                                 FACILITY:   PHYSICIAN:  Guss Bunde, M.D.      DATE OF BIRTH:   DATE OF ADMISSION:  DATE OF DISCHARGE:                                 DISCHARGE SUMMARY   ADDENDUM:  job [phone]   Patient has agreed for triple screen today which will be drawn and sent to  [facility].  She also agrees to sign BTL papers which will be done today as  well.           ______________________________  Guss Bunde, M.D.     KHL/MEDQ  D:  03/12/2005  T:  03/12/2005  Job:  [id number]

## 2011-02-26 NOTE — Discharge Summary (Signed)
NAME:  Kathy Phelps, Kathy Phelps                          ACCOUNT NO.:  [id number]   MEDICAL RECORD NO.:  [id number]                   PATIENT TYPE:  INP   LOCATION:  9157                                 FACILITY:  [facility]   PHYSICIAN:  Vickki Hearing, M.D.                  DATE OF BIRTH:  1981/02/04   DATE OF ADMISSION:  04/04/2003  DATE OF DISCHARGE:                                 DISCHARGE SUMMARY   PRIMARY MEDICAL PHYSICIAN:  [facility]:  1. Oliguria.  2. Insulin-dependent diabetes mellitus.   DISCHARGE MEDICATIONS:  1. Insulin NPH 15 units subcutaneously q.a.m. and 17 units subcutaneously     q.h.s.  2. Insulin regular 5 units subcutaneously q.a.m. and 7 units subcutaneously     at 1700.   BRIEF ADMISSION HISTORY:  This is a 30 year old G4 P0-1-2-1 who originally  presented at 74 and 0 sevenths weeks gestation as a direct admit from [facility].  She had been evaluated in [facility] earlier on the day  of admission and noted to have a headache.  In addition, ultrasound was done  which revealed an AFI of around 24 which had basically doubled recently.  In  addition she was found to have a creatinine of 0.9.  She was consequently  admitted for further evaluation and management.   BRIEF HOSPITAL COURSE:  #1 - OLIGURIA.  The patient was admitted with  questionable preeclampsia and 24-hour urine was obtained.  Her [place] labs  remained essentially normal while in the hospital except for creatinine of  0.8 to 0.9.  Her first 24-hour urine revealed a total of 700 mL of volume  and approximately 168 of protein.  She had these studies repeated on June 28  which revealed a total volume of 925, a creatinine clearance of 134, and a  24-hour urinary protein of 139 mg.  Although Ms. Kathy Phelps did mostly what she  wanted while in the hospital we believe this was an accurate representation  of her kidney function.  While in the hospital she did not  develop any other  preeclamptic symptoms.  However, she did contract intermittently while  admitted.  Her cervix was checked on the day prior to discharge and was  found to be closed, thick, and high on April 08, 2003.   #2 - INSULIN-DEPENDENT DIABETES MELLITUS.  The patient was admitted with  this diagnosis and minor insulin adjustments were made while in the  hospital.  She actually had fairly good blood glucose control even though  her dietary choices were not always with the best discretion.  Consequently,  we will discharge her on the insulin regimen as listed above.   LABORATORY DATA:  OB ultrasound done on April 07, 2003 revealed breech  presentation, grade 2 placenta, 23 cm AFI, 3.2 cm, and BPP of 8/8.   DIET:  The patient was discharged on American Diabetic Diet.   ACTIVITY:  The patient was advised up to eat and bathroom and shower only.  She was advised to not have sex or anything that can make her have an  orgasm.    FOLLOW-UP CARE:  1. April 11, 2003, Thursday, at 10:15 a.m. in the [facility].  2. The patient will be set up for biweekly antenatal testing beginning on     Friday of this week.                                               Vickki Hearing, M.D.    AK/MEDQ  D:  04/09/2003  T:  04/09/2003  Job:  [id number]   cc:   [facility]

## 2011-02-26 NOTE — Discharge Summary (Signed)
NAME:  Kathy Phelps, Kathy Phelps               ACCOUNT NO.:  [id number]   MEDICAL RECORD NO.:  [id number]          PATIENT TYPE:  INP   LOCATION:  9153                          FACILITY:  [facility]   PHYSICIAN:  Tracy L. Jimmye Norman, M.D.DATE OF BIRTH:  December 10, 1980   DATE OF ADMISSION:  05/30/2005  DATE OF DISCHARGE:  05/31/2005                                 DISCHARGE SUMMARY   ADMIT DIAGNOSES:  1.  30-week intrauterine pregnancy.  2.  Uncontrolled gestational diabetes.  3.  History of HELLP syndrome x3.  4.  History of three prior cesarean sections.  5.  Late prenatal care/no prenatal care.   DISCHARGE DIAGNOSES:  1.  30-week intrauterine pregnancy.  2.  Uncontrolled gestational diabetes.  3.  History of HELLP syndrome x3.  4.  History of three prior cesarean sections.  5.  Late prenatal care/no prenatal care.   ADMISSION LABORATORIES:  Hemoglobin 12.5, platelets 193.  Uric acid 3.3.  Creatinine 0.8, LDH 96, blood sugar 272.   HISTORY OF PRESENT ILLNESS:  The patient is a 30 year old G6, P2-1-2-3 at 79-  6/7 weeks who presented with a chief complaint of I have HELLP syndrome.  Patient was in the MAU on August 19 and August 20 with elevated blood  pressure and left against medical advice.  She represented on August 21,  stating that she had epigastric pain, headache for the last two weeks.  She  is also a noncompliant gestational diabetic and admits that she does not  follow the diet.   PAST MEDICAL HISTORY:  Significant for having two spontaneous abortions, a  cesarean section x3 for HELLP syndrome.   MEDICATIONS:  1.  Tramadol that she is not taking.  2.  Glyburide that she is not taking.   HOSPITAL COURSE:  Patient initial blood pressure was 143/82.  She was  admitted for 24-hour urine protein, repeat PIH laboratories, and gestational  diabetes monitoring.  Patient was very noncompliant during her stay here and  actually left against medical advice on August 21.    ______________________________  Trellis Paganini. Jimmye Norman, M.D.    TLW/MEDQ  D:  08/23/2005  T:  08/23/2005  Job:  [id number]

## 2011-02-26 NOTE — Discharge Summary (Signed)
NAME:  Kathy Phelps, Kathy Phelps               ACCOUNT NO.:  [id number]   MEDICAL RECORD NO.:  [id number]          PATIENT TYPE:  INP   LOCATION:  9156                          FACILITY:  [facility]   PHYSICIAN:  Tanya S. Kennon Rounds, M.D.   DATE OF BIRTH:  1980/12/17   DATE OF ADMISSION:  06/19/2005  DATE OF DISCHARGE:  06/20/2005                                 DISCHARGE SUMMARY   FINAL DIAGNOSES:  1.  Intrauterine pregnancy at 34 weeks.  2.  Previous Cesarean section x3.  3.  History of HELLP x3.  4.  Non-compliance.  5.  Diabetes mellitus class D.   PERTINENT LABORATORIES:  The patient had a BCP that was 8/8.  She had  ultrasound that revealed a growth of 90-95 percentile, AC three weeks ahead,  AFI of 20.   REASON FOR ADMISSION:  Briefly, the patient presented with complaints of  feeling like she had HELLP syndrome.   The patient had workup that revealed normal labs.  Her blood pressure was  well controlled, diabetes was poorly controlled.  The patient was admitted  for observation given how preterm she was and patient chose to leave AMA  after less than 24 hour admission.  The patient was counseled regarding risk  of fetal death and maternal death, however, the patient persisted in her  need to leave AMA.  Therefore, the patient had no change in medications,  which she was not taking anyway, and follow-up was arranged through the [facility].           ______________________________  Standley Dakins. Kennon Rounds, M.D.     TSP/MEDQ  D:  09/22/2005  T:  09/22/2005  Job:  [id number]

## 2011-02-26 NOTE — Discharge Summary (Signed)
NAME:  THETA, VERZOSA               ACCOUNT NO.:  [id number]   MEDICAL RECORD NO.:  [id number]          PATIENT TYPE:  INP   LOCATION:  [id number]                           FACILITY:   PHYSICIAN:  Tilman Neat, M.D.  DATE OF BIRTH:  [birth year]   DATE OF ADMISSION:  12/16/2004  DATE OF DISCHARGE:  12/17/2004                                 DISCHARGE SUMMARY   ADMISSION DIAGNOSES:  1.  A 6-5/7 weeks intrauterine pregnancy with uncontrolled diabetes.  2.  Noncompliance.   DISCHARGE DIAGNOSES:  1.  A 6-5/7 weeks intrauterine pregnancy with uncontrolled diabetes.  2.  Noncompliance.   DISCHARGE MEDICATIONS:  1.  NPH insulin 44 units in the morning and 20 units at bedtime.  2.  Regular insulin 22 units in the morning and 16 units at dinner.  3.  Prenatal vitamin one p.o. q day.   ADMISSION HISTORY:  Ms. Renter was informed to come to the hospital for  admission for uncontrolled sugars.  She has no financial assistance at home  and, therefore, cannot give herself insulin or check her sugars.  She is  noncompliant with her diet.   HOSPITAL COURSE:  Her CBGs came down nicely once she was placed back on her  regimen.  Her morning sugar was 98 and a 3 a.m. sugar was 128.  She had no  complaints on the day of discharge.   She was again instructed on the need for glucose control.  Social work has  been consulted to try to get her any assistance necessary so that she can  give herself insulin at home.  Of course, the noncompliance is an issue and  she will likely never be normoglycemic but hopefully we can keep her out of  ketoacidosis.   CONDITION ON DISCHARGE:  The patient was discharged to home in stable  condition.      LC/MEDQ  D:  12/17/2004  T:  12/17/2004  Job:  [id number]   cc:   [facility]

## 2011-02-26 NOTE — Group Therapy Note (Signed)
NAME:  Kathy Phelps, Kathy Phelps                         ACCOUNT NO.:  [id number]   MEDICAL RECORD NO.:  [id number]                   PATIENT TYPE:  OUT   LOCATION:  [facility]                           FACILITY:  [facility]   PHYSICIAN:  Andrew Au, MD                     DATE OF BIRTH:  08/01/1981   DATE OF SERVICE:  09/19/2003                                    CLINIC NOTE   CHIEF COMPLAINT:  The patient is a 30 year old white female gravida 5 para 1-  1-2-2 who is a type 2 diabetic, has been on oral medication since the  delivery of her last baby in July, and was on insulin with her previous  pregnancies.  She was delivered the last two pregnancies by C-section.  Her  last baby was at 37 weeks and her previous at 26 weeks where she had HELLP  syndrome with both pregnancies at the end.  She is not sure when her last  menstrual period was she has had spotting occasionally since the delivery of  her baby.   PHYSICAL EXAMINATION:  ABDOMEN:  Soft, flat, nontender.  No masses, no  organomegaly.  PELVIC:  The genitalia, external, is normal.  BUS was within normal limits  and a marital introitus.  Vagina is clean and well rugated with a creamy  white discharge.  The cervix is nulliparous and clean.  Wet prep and  cultures for chlamydia and GC were taken.  The patient had a Pap smear  previously so is not due for that as yet.  OB labs will be drawn and the  patient will be sent to the high risk OB clinic.   IMPRESSION:  Intrauterine pregnancy approximately 10-[redacted] weeks gestation by  uterine size; sonogram pending.  The patient desires tubal ligation.  Will  have her sign with her first OB clinic visit.  Labs are pending.  Referred  to OB clinic in two weeks.                                               Andrew Au, MD    PR/MEDQ  D:  09/19/2003  T:  09/19/2003  Job:  [id number]

## 2011-02-26 NOTE — Discharge Summary (Signed)
NAME:  Kathy Phelps, Kathy Phelps                          ACCOUNT NO.:  [id number]   MEDICAL RECORD NO.:  [id number]                   PATIENT TYPE:  INP   LOCATION:  9143                                 FACILITY:  [facility]   PHYSICIAN:  Dr. Darron Doom                     DATE OF BIRTH:  28-Aug-1981   DATE OF ADMISSION:  05/09/2003  DATE OF DISCHARGE:  05/13/2003                                 DISCHARGE SUMMARY   ADMISSION DIAGNOSES:  17. A 30 year old gravida 4, para 0-1-2-1 at 38-1/7.  2. Vaginal bleeding with abdominal pain.  3. Severe thrombocytopenia with platelet count of 48,000.  4. Diabetes mellitus, class B.   DISCHARGE DIAGNOSES:  71. A 30 year old gravida 4, para 1-1-2-2, postoperative day three from a     repeat low-transverse cesarean section.  2. Resolving thrombocytopenia.  3. Diabetes mellitus, class B.   DISCHARGE MEDICATIONS:  1. Percocet 5/325, 1-2 tabs q.4-6h. p.r.n.  2. Ibuprofen 600 mg q.6h. p.r.n.  3. Ortho-Novum one p.o. every day as directed.  4. Glucophage 850 mg p.o. b.i.d.  5. Prenatal vitamin one p.o. every day.   RECOMMENDED DIET:  A 2,000 calorie ADA diet.   ADMISSION HISTORY:  [name] is a 30 year old G4, P0-1-2-1 who presented at  38-1/7 weeks with vaginal bleeding and epigastric pain.  She was found to  have elevated blood pressures of 140/73 with a platelet count of 48,000.  Fetal heart tracing was reactive on admission.  Her cervix was noted to be  closed.  She was admitted to rule out pre-eclampsia and for a repeat low-  transverse C-section.  The patient underwent a C-section, please see the  dictated op note.  Prior to her C-section she was transfused two units of  platelets.  Following the transfusion her platelet count was 85,000.  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  Over the next few days her  platelet count increased and on the day of discharge it was noted to  be 165.  Other causes of thrombocytopenia were ruled out including lupus, thyroid  disease and a peripheral smear is pending on the day of discharge.   Diabetes mellitus.  She was noncompliant throughout her pregnancy.  While in  the hospital her CBGs remained relatively stable and ranged from 90s-120s.  Her discharge sugar was noted to be 114 which was fasting.  The patient did  not agree to insulin injections but did agree to oral medication and she has  been placed on Glucophage for discharge.  She was instructed to obtain a  primary care physician to follow her diabetes.   The patient is bottle feeding her infant.  For contraception she is to use  Ortho-Novum.   The patient was discharged to home in stable condition.   DISCHARGE INSTRUCTIONS:  The patient was  told of her medical regimen.  She  was told to follow an ADA diet.  She is to follow up in [facility] in six  weeks as well as to arrange for primary care.  Her infant is going home with  her on the day of discharge.         Wolfgang Phoenix, M.D.                        Dr. Darron Doom    LC/MEDQ  D:  05/14/2003  T:  05/14/2003  Job:  [id number]   cc:   Standley Dakins. Kennon Rounds, M.D.  [address]  [place], [place] [address]  Fax: [phone]

## 2011-02-26 NOTE — Discharge Summary (Signed)
NAME:  Kathy Phelps, Kathy Phelps                         ACCOUNT NO.:  [id number]   MEDICAL RECORD NO.:  [id number]                   PATIENT TYPE:  INP   LOCATION:  [id number]                                 FACILITY:  [facility]   PHYSICIAN:  Guss Bunde, M.D.              DATE OF BIRTH:  [birth year]   DATE OF ADMISSION:  10/03/2003  DATE OF DISCHARGE:  10/07/2003                                 DISCHARGE SUMMARY   DISCHARGE DIAGNOSES:  1. Intrauterine pregnancy.  2. Diabetes mellitus, type 2, uncontrolled.  3. Vaginal bleeding.  4. Tobacco abuse.  5. Headache.  6. Hypertension.   PROCEDURES DURING HOSPITALIZATION:  OB ultrasound for viability.   BRIEF HISTORY AND HOSPITAL COURSE:  #1 - INTRAUTERINE PREGNANCY WITH  DIABETES MELLITUS, TYPE 2, UNCONTROLLED:  Ms. Cayton is a 30 year old, G5,  P1-1-2-2, who presented at 10 weeks and 4 days EGA with an EDD of April 25, 2004, by 9 week ultrasound.  She is a patient with HELLP syndrome and  previous 2 pregnancies as well as uncontrolled diabetes and hypertension.  She was admitted for diabetic control as well as a collection of a 24-hour  urine for her baseline kidney function during this pregnancy.  Her admission  A1c was 9.3 which was explained to her as having increased risk for fetal  complication.  During hospitalization, her insulin was titrated to control  CBGs.  She was under moderate control at discharge with insulin as discussed  below.  Education was also a large part of this visit, especially educating  about possible complication, and patient is very resistant to change in  lifestyle or diet.  She was instructed on diet and exercise during this  pregnancy.   #2 - VAGINAL BLEEDING:  Ms. Hauenstein stated that on the day of admission, she  had a gush of blood of an unknown amount in her toilet.  An OB ultrasound  for viability was performed during this hospitalization and showed a viable  10 week pregnancy consistent with her previous dating.   It also showed a  subchorionic hemorrhage, although she did have no further bleeding during  hospitalization.   #3 - DULL, TENSION-TYPE HEADACHE, UNCONTROLLED WITHOUT TYLENOL OR PERCOCET  AND NASAL SALINE DURING HOSPITALIZATION:  This was not a life-limiting  headache nor explained by the patient as severe.  She was to be discharged  home to deal with her headache there.   #4 - TOBACCO ABUSE:  The patient was given nicotine patch during her  hospitalization as well as a prescription for this as she exited the  hospital, which she stated she may or may not use.   #5 - HYPERTENSION:  This was stable throughout this hospitalization without  any medications but secondary to her previous HELLP syndrome and PIH, a 24-  hour urine was collected, and results were not available at time of  discharge.   DISCHARGE INSTRUCTIONS:  The patient was given routine instructions as well  as medications of insulin NPH 36 units q.a.m. and 24 units q.p.m., also  regular insulin 22 units q.a.m. and 16 units q.p.m.  She was to decrease the  sugar and carbohydrates in her diet and get plenty of exercise, 30 minutes a  day.  Follow up October 09, 2003, at 10:30 a.m. in the high risk clinic.  She was discharged in stable condition.     Agustina Caroli, MD                        Guss Bunde, M.D.    JS/MEDQ  D:  10/29/2003  T:  10/30/2003  Job:  [id number]

## 2011-07-02 LAB — POCT PREGNANCY, URINE: Preg Test, Ur: NEGATIVE

## 2011-07-02 LAB — I-STAT 8, (EC8 V) (CONVERTED LAB)
Acid-base deficit: 2
Bicarbonate: 24.7 — ABNORMAL HIGH
Chloride: 102
Glucose, Bld: 295 — ABNORMAL HIGH
Glucose, Bld: 328 — ABNORMAL HIGH
HCT: 49 — ABNORMAL HIGH
Hemoglobin: 16.7 — ABNORMAL HIGH
Operator id: 222501
Potassium: 4.3
Potassium: 4
Sodium: 134 — ABNORMAL LOW
TCO2: 26
pH, Ven: 7.355 — ABNORMAL HIGH

## 2011-07-02 LAB — URINALYSIS, ROUTINE W REFLEX MICROSCOPIC
Glucose, UA: >1000 — AB
Leukocytes, UA: NEGATIVE
Nitrite: NEGATIVE
Protein, ur: NEGATIVE
pH: 5

## 2011-07-02 LAB — RAPID URINE DRUG SCREEN, HOSP PERFORMED
Amphetamines: NOT DETECTED
Cocaine: NOT DETECTED
Tetrahydrocannabinol: NOT DETECTED

## 2011-07-02 LAB — DIFFERENTIAL
Basophils Absolute: 0.1
Eosinophils Relative: 5
Lymphocytes Relative: 37
Lymphs Abs: 3.7
Neutro Abs: 5.1

## 2011-07-02 LAB — CBC
HCT: 44.7
Platelets: 199
RDW: 12.3
WBC: 9.9

## 2011-07-02 LAB — URINE MICROSCOPIC-ADD ON

## 2011-07-02 LAB — POCT URINALYSIS DIP (DEVICE)
Bilirubin Urine: NEGATIVE
Specific Gravity, Urine: 1.02
pH: 5

## 2011-07-02 LAB — POCT I-STAT CREATININE
Creatinine, Ser: 1
Operator id: 222501

## 2011-07-05 LAB — COMPREHENSIVE METABOLIC PANEL
ALT: <8
Albumin: 3.2 — ABNORMAL LOW
Alkaline Phosphatase: 54
Calcium: 8.7
Glucose, Bld: 354 — ABNORMAL HIGH
Potassium: 4
Sodium: 136
Total Protein: 6.4

## 2011-07-05 LAB — DIFFERENTIAL
Basophils Relative: 0
Eosinophils Absolute: 0.5
Lymphs Abs: 3.3
Monocytes Absolute: 0.4
Monocytes Relative: 5
Neutro Abs: 4.3
Neutrophils Relative %: 50

## 2011-07-05 LAB — CBC
MCHC: 35.2
Platelets: 225
RDW: 11.8

## 2011-07-06 LAB — CBC
HCT: 42.7
HCT: 45.7
Hemoglobin: 14.9
MCHC: 34.9
MCV: 91.3
MCV: 91.3
Platelets: 203
Platelets: 208
RBC: 4.67
RDW: 12.3
RDW: 12.5
WBC: 9.8

## 2011-07-06 LAB — COMPREHENSIVE METABOLIC PANEL
ALT: 12
AST: 16
Albumin: 3.3 — ABNORMAL LOW
Alkaline Phosphatase: 56
BUN: 7
CO2: 26
Calcium: 9.3
Chloride: 101
Creatinine, Ser: 0.7
GFR calc Af Amer: >60
GFR calc non Af Amer: >60
Glucose, Bld: 356 — ABNORMAL HIGH
Potassium: 3.7
Sodium: 134 — ABNORMAL LOW
Total Bilirubin: 0.5
Total Protein: 6.5

## 2011-07-06 LAB — URINALYSIS, ROUTINE W REFLEX MICROSCOPIC
Bilirubin Urine: NEGATIVE
Glucose, UA: >1000 — AB
Hgb urine dipstick: NEGATIVE
Ketones, ur: NEGATIVE
Leukocytes, UA: NEGATIVE
Nitrite: NEGATIVE
Protein, ur: NEGATIVE
Specific Gravity, Urine: 1.042 — ABNORMAL HIGH
Urobilinogen, UA: 0.2
pH: 5

## 2011-07-06 LAB — DIFFERENTIAL
Basophils Absolute: 0
Basophils Absolute: 0
Basophils Relative: 0
Eosinophils Absolute: 0.5
Eosinophils Absolute: 0.5
Eosinophils Relative: 5
Eosinophils Relative: 4
Lymphocytes Relative: 32
Lymphs Abs: 3.1
Monocytes Absolute: 0.5
Monocytes Relative: 5
Neutro Abs: 5.7
Neutrophils Relative %: 58

## 2011-07-06 LAB — BASIC METABOLIC PANEL
BUN: 8
Chloride: 102
Glucose, Bld: 397 — ABNORMAL HIGH
Potassium: 3.7

## 2011-07-06 LAB — POCT I-STAT, CHEM 8
BUN: 13
Creatinine, Ser: 0.9
Glucose, Bld: 255 — ABNORMAL HIGH
Potassium: 3.9
Sodium: 137

## 2011-07-06 LAB — D-DIMER, QUANTITATIVE: D-Dimer, Quant: 0.25

## 2011-07-06 LAB — LIPASE, BLOOD: Lipase: 54

## 2011-07-06 LAB — URINE MICROSCOPIC-ADD ON

## 2011-07-06 LAB — POCT CARDIAC MARKERS: Troponin i, poc: <0.05

## 2011-07-08 LAB — CBC
HCT: 39.5
Hemoglobin: 13.7
MCHC: 34.6
Platelets: 221
RBC: 4.31
RBC: 4.87
WBC: 12 — ABNORMAL HIGH

## 2011-07-08 LAB — POCT I-STAT, CHEM 8
BUN: 11
Calcium, Ion: 1.15
Chloride: 99
Creatinine, Ser: 0.8
Creatinine, Ser: 0.9
HCT: 37
Hemoglobin: 15.6 — ABNORMAL HIGH
Potassium: 4
Sodium: 140
Sodium: 134 — ABNORMAL LOW
TCO2: 27

## 2011-07-08 LAB — COMPREHENSIVE METABOLIC PANEL
ALT: <8
Alkaline Phosphatase: 56
CO2: 26
Calcium: 8.4
GFR calc non Af Amer: >60
Glucose, Bld: 363 — ABNORMAL HIGH
Sodium: 136

## 2011-07-08 LAB — DIFFERENTIAL
Basophils Relative: 0
Eosinophils Absolute: 0.4
Eosinophils Absolute: 0.5
Lymphocytes Relative: 32
Lymphs Abs: 3.4
Lymphs Abs: 3.9
Neutro Abs: 6.9
Neutrophils Relative %: 48
Neutrophils Relative %: 58

## 2011-07-08 LAB — URINALYSIS, ROUTINE W REFLEX MICROSCOPIC
Glucose, UA: >1000 — AB
Glucose, UA: >1000 — AB
Hgb urine dipstick: NEGATIVE
Leukocytes, UA: NEGATIVE
Leukocytes, UA: NEGATIVE
Nitrite: NEGATIVE
Specific Gravity, Urine: >1.046 — ABNORMAL HIGH
Specific Gravity, Urine: 1.046 — ABNORMAL HIGH
Urobilinogen, UA: 0.2
pH: 5.5

## 2011-07-08 LAB — URINE MICROSCOPIC-ADD ON

## 2011-07-08 LAB — LIPASE, BLOOD: Lipase: 61 — ABNORMAL HIGH

## 2011-07-09 LAB — BASIC METABOLIC PANEL
BUN: 12
Calcium: 9.2
Creatinine, Ser: 0.84
GFR calc non Af Amer: >60
Glucose, Bld: 374 — ABNORMAL HIGH

## 2011-07-09 LAB — GLUCOSE, CAPILLARY: Glucose-Capillary: 470 — ABNORMAL HIGH

## 2011-07-12 LAB — POCT I-STAT, CHEM 8
Calcium, Ion: 1.07 — ABNORMAL LOW
Glucose, Bld: 322 — ABNORMAL HIGH
HCT: 40
Hemoglobin: 13.6

## 2011-07-14 LAB — POCT I-STAT, CHEM 8
BUN: 13
BUN: 9
Calcium, Ion: 1.13
Calcium, Ion: 1.21
Chloride: 101
Chloride: 105
Creatinine, Ser: 0.8
HCT: 48 — ABNORMAL HIGH
Potassium: 3.6
Sodium: 137
TCO2: 30

## 2011-07-14 LAB — URINALYSIS, ROUTINE W REFLEX MICROSCOPIC
Bilirubin Urine: NEGATIVE
Hgb urine dipstick: NEGATIVE
Protein, ur: NEGATIVE
Specific Gravity, Urine: >1.046 — ABNORMAL HIGH
Urobilinogen, UA: 1

## 2011-07-14 LAB — PREGNANCY, URINE: Preg Test, Ur: NEGATIVE

## 2011-07-14 LAB — URINE MICROSCOPIC-ADD ON

## 2011-07-16 LAB — URINALYSIS, ROUTINE W REFLEX MICROSCOPIC
Bilirubin Urine: NEGATIVE
Glucose, UA: >1000 mg/dL — AB
Glucose, UA: >1000 — AB
Hgb urine dipstick: NEGATIVE
Ketones, ur: NEGATIVE
Leukocytes, UA: NEGATIVE
Nitrite: NEGATIVE
Nitrite: NEGATIVE
Protein, ur: NEGATIVE
Specific Gravity, Urine: 1.046 — ABNORMAL HIGH (ref 1.005–1.030)
Specific Gravity, Urine: 1.037 — ABNORMAL HIGH
Urobilinogen, UA: 0.2
pH: 5.5 (ref 5.0–8.0)
pH: 6

## 2011-07-16 LAB — WET PREP, GENITAL
Trich, Wet Prep: NONE SEEN
WBC, Wet Prep HPF POC: NONE SEEN
Yeast Wet Prep HPF POC: NONE SEEN

## 2011-07-16 LAB — RPR: RPR Ser Ql: NONREACTIVE

## 2011-07-16 LAB — GC/CHLAMYDIA PROBE AMP, GENITAL
Chlamydia, DNA Probe: NEGATIVE
GC Probe Amp, Genital: NEGATIVE

## 2011-07-16 LAB — PREGNANCY, URINE: Preg Test, Ur: NEGATIVE

## 2011-07-16 LAB — URINE MICROSCOPIC-ADD ON

## 2011-07-16 LAB — POCT PREGNANCY, URINE: Preg Test, Ur: NEGATIVE

## 2011-07-22 LAB — CBC
HCT: 39.4
Hemoglobin: 13.8
MCHC: 35
MCV: 90.5
Platelets: 216
RBC: 4.35
RDW: 12.3
WBC: 9.6

## 2011-07-22 LAB — DIFFERENTIAL
Basophils Absolute: 0
Basophils Relative: 1
Eosinophils Absolute: 0.9 — ABNORMAL HIGH
Eosinophils Relative: 9 — ABNORMAL HIGH
Lymphocytes Relative: 43
Lymphs Abs: 4.2 — ABNORMAL HIGH
Monocytes Absolute: 0.5
Monocytes Relative: 6
Neutro Abs: 4
Neutrophils Relative %: 42 — ABNORMAL LOW

## 2011-07-22 LAB — OCCULT BLOOD X 1 CARD TO LAB, STOOL: Fecal Occult Bld: NEGATIVE

## 2011-07-23 LAB — CBC
HCT: 39.4
HCT: 43.3
Hemoglobin: 13.8
Hemoglobin: 15.2 — ABNORMAL HIGH
MCHC: 35.1
MCHC: 35.1
MCV: 90.1
MCV: 91.1
Platelets: 182
Platelets: 222
RBC: 4.37
RBC: 4.75
RDW: 12.4
RDW: 12.4
WBC: 7.3
WBC: 9.5

## 2011-07-23 LAB — URINALYSIS, ROUTINE W REFLEX MICROSCOPIC
Bilirubin Urine: NEGATIVE
Bilirubin Urine: NEGATIVE
Bilirubin Urine: NEGATIVE
Glucose, UA: >1000 — AB
Glucose, UA: >1000 — AB
Glucose, UA: >1000 — AB
Hgb urine dipstick: NEGATIVE
Ketones, ur: NEGATIVE
Ketones, ur: NEGATIVE
Ketones, ur: NEGATIVE
Leukocytes, UA: NEGATIVE
Leukocytes, UA: NEGATIVE
Nitrite: POSITIVE — AB
Nitrite: POSITIVE — AB
Nitrite: POSITIVE — AB
Protein, ur: NEGATIVE
Protein, ur: NEGATIVE
Protein, ur: NEGATIVE
Specific Gravity, Urine: 1.037 — ABNORMAL HIGH
Specific Gravity, Urine: 1.039 — ABNORMAL HIGH
Specific Gravity, Urine: 1.043 — ABNORMAL HIGH
Urobilinogen, UA: 0.2
Urobilinogen, UA: 1
Urobilinogen, UA: 1
pH: 5
pH: 5
pH: 5.5

## 2011-07-23 LAB — DIFFERENTIAL
Basophils Absolute: 0
Basophils Absolute: 0
Basophils Relative: 0
Basophils Relative: 1
Eosinophils Absolute: 0.4
Eosinophils Absolute: 0.5
Eosinophils Relative: 5
Eosinophils Relative: 6 — ABNORMAL HIGH
Lymphocytes Relative: 27
Lymphocytes Relative: 36
Lymphs Abs: 2
Lymphs Abs: 3.4 — ABNORMAL HIGH
Monocytes Absolute: 0.4
Monocytes Absolute: 0.6
Monocytes Relative: 6
Monocytes Relative: 6
Neutro Abs: 4.5
Neutro Abs: 4.9
Neutrophils Relative %: 51
Neutrophils Relative %: 62

## 2011-07-23 LAB — URINE MICROSCOPIC-ADD ON

## 2011-07-23 LAB — COMPREHENSIVE METABOLIC PANEL
ALT: <8
AST: 19
Albumin: 3.4 — ABNORMAL LOW
Alkaline Phosphatase: 67
BUN: 5 — ABNORMAL LOW
CO2: 23
Calcium: 8.9
Chloride: 103
Creatinine, Ser: 0.91
GFR calc Af Amer: >60
GFR calc non Af Amer: >60
Glucose, Bld: 428 — ABNORMAL HIGH
Potassium: 3.9
Sodium: 135
Total Bilirubin: 0.7
Total Protein: 6.7

## 2011-07-23 LAB — HEPATIC FUNCTION PANEL
ALT: <8
AST: 12
Albumin: 3 — ABNORMAL LOW
Alkaline Phosphatase: 58
Bilirubin, Direct: <0.1
Total Bilirubin: 0.3
Total Protein: 5.9 — ABNORMAL LOW

## 2011-07-23 LAB — PREGNANCY, URINE: Preg Test, Ur: NEGATIVE

## 2011-07-23 LAB — I-STAT 8, (EC8 V) (CONVERTED LAB)
Acid-Base Excess: 2
BUN: 10
Bicarbonate: 28.7 — ABNORMAL HIGH
Chloride: 102
Glucose, Bld: 339 — ABNORMAL HIGH
HCT: 42
Hemoglobin: 14.3
Operator id: 277751
Potassium: 3.6
Sodium: 136
TCO2: 30
pCO2, Ven: 53.7 — ABNORMAL HIGH
pH, Ven: 7.337 — ABNORMAL HIGH

## 2011-07-23 LAB — LIPASE, BLOOD
Lipase: 29
Lipase: 67 — ABNORMAL HIGH

## 2011-07-23 LAB — POCT I-STAT CREATININE
Creatinine, Ser: 0.9
Operator id: 277751

## 2011-07-23 LAB — POCT PREGNANCY, URINE
Operator id: 196461
Preg Test, Ur: NEGATIVE

## 2011-07-23 LAB — OCCULT BLOOD X 1 CARD TO LAB, STOOL: Fecal Occult Bld: NEGATIVE

## 2011-07-26 LAB — CBC
HCT: 43
Hemoglobin: 15.3 — ABNORMAL HIGH
MCHC: 35.5
MCV: 89.7
Platelets: 203
RBC: 4.8
RDW: 12.1
WBC: 10.6 — ABNORMAL HIGH

## 2011-07-26 LAB — COMPREHENSIVE METABOLIC PANEL
ALT: 9
AST: 17
Albumin: 3.4 — ABNORMAL LOW
Alkaline Phosphatase: 65
BUN: 11
CO2: 24
Calcium: 9
Chloride: 102
Creatinine, Ser: 0.82
GFR calc Af Amer: >60
GFR calc non Af Amer: >60
Glucose, Bld: 253 — ABNORMAL HIGH
Potassium: 3.4 — ABNORMAL LOW
Sodium: 136
Total Bilirubin: 0.6
Total Protein: 6.9

## 2011-07-26 LAB — DIFFERENTIAL
Basophils Absolute: 0.1
Basophils Relative: 1
Eosinophils Absolute: 0.5
Eosinophils Relative: 5
Lymphocytes Relative: 36
Lymphs Abs: 3.8 — ABNORMAL HIGH
Monocytes Absolute: 0.6
Monocytes Relative: 6
Neutro Abs: 5.6
Neutrophils Relative %: 53

## 2011-07-26 LAB — PROTIME-INR
INR: 1
Prothrombin Time: 13.1

## 2011-07-26 LAB — SALICYLATE LEVEL: Salicylate Lvl: <4

## 2011-07-26 LAB — PREGNANCY, URINE: Preg Test, Ur: NEGATIVE

## 2011-07-26 LAB — LIPASE, BLOOD: Lipase: 45

## 2011-07-26 LAB — APTT: aPTT: 28

## 2011-10-07 ENCOUNTER — Emergency Department (HOSPITAL_COMMUNITY): Payer: Self-pay

## 2011-10-07 ENCOUNTER — Emergency Department (HOSPITAL_COMMUNITY)
Admission: EM | Admit: 2011-10-07 | Discharge: 2011-10-07 | Discharge status: Against Medical Advice | Payer: Self-pay | Attending: Emergency Medicine | Admitting: Emergency Medicine

## 2011-10-07 ENCOUNTER — Encounter: Payer: Self-pay | Admitting: Emergency Medicine

## 2011-10-07 DIAGNOSIS — R229 Localized swelling, mass and lump, unspecified: Secondary | ICD-10-CM | POA: Insufficient documentation

## 2011-10-07 DIAGNOSIS — R0602 Shortness of breath: Secondary | ICD-10-CM | POA: Insufficient documentation

## 2011-10-07 DIAGNOSIS — R609 Edema, unspecified: Secondary | ICD-10-CM | POA: Insufficient documentation

## 2011-10-07 DIAGNOSIS — R040 Epistaxis: Secondary | ICD-10-CM | POA: Insufficient documentation

## 2011-10-07 DIAGNOSIS — I10 Essential (primary) hypertension: Secondary | ICD-10-CM | POA: Insufficient documentation

## 2011-10-07 DIAGNOSIS — N912 Amenorrhea, unspecified: Secondary | ICD-10-CM | POA: Insufficient documentation

## 2011-10-07 DIAGNOSIS — E119 Type 2 diabetes mellitus without complications: Secondary | ICD-10-CM | POA: Insufficient documentation

## 2011-10-07 HISTORY — DX: Pregnancy related conditions, unspecified, unspecified trimester: O26.90

## 2011-10-07 HISTORY — DX: Hyperlipidemia, unspecified: E78.5

## 2011-10-07 HISTORY — DX: Complete or unspecified spontaneous abortion without complication: O03.9

## 2011-10-07 HISTORY — DX: Essential (primary) hypertension: I10

## 2011-10-07 LAB — URINE MICROSCOPIC-ADD ON

## 2011-10-07 LAB — URINALYSIS, ROUTINE W REFLEX MICROSCOPIC
Glucose, UA: >1000 mg/dL — AB
Hgb urine dipstick: NEGATIVE
Ketones, ur: NEGATIVE mg/dL
Protein, ur: NEGATIVE mg/dL
Urobilinogen, UA: 0.2 mg/dL (ref 0.0–1.0)

## 2011-10-07 MED ORDER — FUROSEMIDE 20 MG PO TABS
20.0000 mg | ORAL_TABLET | Freq: Once | ORAL | Status: DC
  Filled 2011-10-07: qty 1

## 2011-10-07 NOTE — ED Notes (Signed)
Pt states she wants to go home and wants to sign out "AMA", pt educated on importance of being screened and evaluated by MD, pt verbalizes understanding. Pt educated on risk associated with leaving including death

## 2011-10-07 NOTE — ED Notes (Signed)
Pt states that she has been swelling "all over" and has had blood coming out of her nose and ears for a week. She is having burning in her epigastric region and has not had a period in 2 1/2 months.

## 2011-10-07 NOTE — ED Provider Notes (Signed)
History     CSN: [id number]  Arrival date & time 10/07/11  2040   First MD Initiated Contact with Patient 10/07/11 2305     11:33 PM HPI Kathy Phelps is 30 y.o. female complaining of generalized swelling, bilateral nose and ear bleeding and shortness of breath. Reports all her clothing is too tight for her now and she feels like she has pressure everywhere. Denies ETOH or drug abuse. Reports h/o HTN and DM but does not take meds because she cannot afford it. Denies abdominal or chest pain. Reports no aggravating or alleviating factors. Denies having a primary care physician. States "I want to be admitted."  The history is provided by the patient.    Past Medical History  Diagnosis Date  . Hypertension   . Diabetes mellitus   . Hyperlipidemia   . Pregnancy complication     HELP Syndrome  . Complete miscarriage     Past Surgical History  Procedure Date  . Cesarean section   . Cholecystectomy     No family history on file.  History  Substance Use Topics  . Smoking status: Current Everyday Smoker  . Smokeless tobacco: Not on file  . Alcohol Use: No    OB History    Grav Para Term Preterm Abortions TAB SAB Ect Mult Living                  Review of Systems  Constitutional: Negative for fever and chills.  HENT: Positive for nosebleeds.   Respiratory: Positive for shortness of breath. Negative for cough.   Cardiovascular: Positive for leg swelling. Negative for chest pain.  Gastrointestinal: Negative for nausea, vomiting and abdominal pain.  Genitourinary: Negative for dysuria, frequency, hematuria and pelvic pain.  Musculoskeletal: Positive for joint swelling. Negative for myalgias and back pain.       Swelling  Neurological: Negative for dizziness, light-headedness and headaches.  Hematological: Bruises/bleeds easily.    Allergies  Vicodin  Home Medications  No current outpatient prescriptions on file.  BP 148/100  Pulse 71  Temp(Src) 98.3 F (36.8 C)  (Oral)  Resp 18  SpO2 100%  LMP 06/23/2011  Physical Exam  Vitals reviewed. Constitutional: She is oriented to person, place, and time. Vital signs are normal. She appears well-developed and well-nourished.  HENT:  Head: Normocephalic and atraumatic.  Right Ear: External ear normal. No hemotympanum (blood in ear canal).  Left Ear: External ear normal. No hemotympanum (blood in ear canal ).  Nose: Nose normal. No mucosal edema, rhinorrhea, nose lacerations, sinus tenderness or nasal septal hematoma. No epistaxis.  Mouth/Throat: Uvula is midline, oropharynx is clear and moist and mucous membranes are normal. No oropharyngeal exudate.       Appears to have a "moon face"  Eyes: Conjunctivae are normal. Pupils are equal, round, and reactive to light.  Neck: Normal range of motion. Neck supple.  Cardiovascular: Normal rate, regular rhythm and normal heart sounds.  Exam reveals no friction rub.   No murmur heard. Pulmonary/Chest: Effort normal and breath sounds normal. She has no wheezes. She has no rhonchi. She has no rales. She exhibits no tenderness.  Abdominal: Soft. Bowel sounds are normal. She exhibits no distension and no mass. There is no tenderness. There is no rebound and no guarding.  Musculoskeletal: Normal range of motion. She exhibits edema (generalized. ). She exhibits no tenderness.  Neurological: She is alert and oriented to person, place, and time. Coordination normal.  Skin: Skin is warm and dry.  No rash noted. No erythema. No pallor.    ED Course  Procedures   MDM   11:53 PM Patient has left ama without informing myself or staff. I have discussed plan to have labs drawn and give her lasix. As well as give Rx for HTN. But apparently patient has left.       Sheliah Mends, [place] 10/07/11 2356

## 2011-10-07 NOTE — ED Provider Notes (Signed)
Medical screening examination/treatment/procedure(s) were performed by non-physician practitioner and as supervising physician I was immediately available for consultation/collaboration.  Carlisle Beers, MD 10/07/11 [phone]

## 2012-01-02 ENCOUNTER — Other Ambulatory Visit: Payer: Self-pay

## 2012-01-02 ENCOUNTER — Encounter (HOSPITAL_COMMUNITY): Payer: Self-pay | Admitting: *Deleted

## 2012-01-02 ENCOUNTER — Emergency Department (HOSPITAL_COMMUNITY): Payer: Self-pay

## 2012-01-02 ENCOUNTER — Emergency Department (HOSPITAL_COMMUNITY)
Admission: EM | Admit: 2012-01-02 | Discharge: 2012-01-03 | Discharge status: Against Medical Advice | Payer: Self-pay | Attending: Emergency Medicine | Admitting: Emergency Medicine

## 2012-01-02 DIAGNOSIS — R209 Unspecified disturbances of skin sensation: Secondary | ICD-10-CM | POA: Insufficient documentation

## 2012-01-02 DIAGNOSIS — R739 Hyperglycemia, unspecified: Secondary | ICD-10-CM

## 2012-01-02 DIAGNOSIS — R079 Chest pain, unspecified: Secondary | ICD-10-CM | POA: Insufficient documentation

## 2012-01-02 DIAGNOSIS — M6281 Muscle weakness (generalized): Secondary | ICD-10-CM | POA: Insufficient documentation

## 2012-01-02 DIAGNOSIS — R109 Unspecified abdominal pain: Secondary | ICD-10-CM | POA: Insufficient documentation

## 2012-01-02 DIAGNOSIS — E119 Type 2 diabetes mellitus without complications: Secondary | ICD-10-CM | POA: Insufficient documentation

## 2012-01-02 DIAGNOSIS — R42 Dizziness and giddiness: Secondary | ICD-10-CM | POA: Insufficient documentation

## 2012-01-02 DIAGNOSIS — R531 Weakness: Secondary | ICD-10-CM

## 2012-01-02 DIAGNOSIS — R0602 Shortness of breath: Secondary | ICD-10-CM | POA: Insufficient documentation

## 2012-01-02 DIAGNOSIS — I10 Essential (primary) hypertension: Secondary | ICD-10-CM | POA: Insufficient documentation

## 2012-01-02 HISTORY — DX: Major depressive disorder, single episode, unspecified: F32.9

## 2012-01-02 HISTORY — DX: Bipolar disorder, unspecified: F31.9

## 2012-01-02 HISTORY — DX: Depression, unspecified: F32.A

## 2012-01-02 MED ORDER — NITROGLYCERIN 0.4 MG SL SUBL: 0.4000 mg | SUBLINGUAL_TABLET | SUBLINGUAL | Status: DC | PRN

## 2012-01-02 MED ORDER — ASPIRIN 81 MG PO CHEW
324.0000 mg | CHEWABLE_TABLET | Freq: Once | ORAL | Status: AC
  Administered 2012-01-02: 324 mg via ORAL

## 2012-01-02 MED ORDER — ASPIRIN 325 MG PO TABS: 325.0000 mg | ORAL_TABLET | ORAL | Status: DC

## 2012-01-02 MED ORDER — ASPIRIN 81 MG PO CHEW
CHEWABLE_TABLET | ORAL | Status: AC
  Filled 2012-01-02: qty 4

## 2012-01-02 NOTE — ED Notes (Signed)
Pt woke up around noon and got up.  She became dizzy at the time and noticed that her left side was numb.  Pt states that she was not able to walk for one hour after this occurred.  Pt then began having abdominal pain later in the day(no n/v or d)  Now stabbing CP with sob.  Pt states that she also had slurred speech this am.  No slurred speech or facial asymetry on exam in triage, inconclusive (atypical) asessment of weakness in triage.  Pt is alert and oriented

## 2012-01-03 ENCOUNTER — Emergency Department (HOSPITAL_COMMUNITY): Payer: Self-pay

## 2012-01-03 LAB — CBC
MCHC: 36.3 g/dL — ABNORMAL HIGH (ref 30.0–36.0)
Platelets: 265 10*3/uL (ref 150–400)
RDW: 12.6 % (ref 11.5–15.5)
WBC: 11 10*3/uL — ABNORMAL HIGH (ref 4.0–10.5)

## 2012-01-03 LAB — BASIC METABOLIC PANEL
Chloride: 95 mEq/L — ABNORMAL LOW (ref 96–112)
GFR calc Af Amer: >90 mL/min (ref >90)
GFR calc non Af Amer: >90 mL/min (ref >90)
Potassium: 3.5 mEq/L (ref 3.5–5.1)

## 2012-01-03 LAB — POCT PREGNANCY, URINE: Preg Test, Ur: NEGATIVE

## 2012-01-03 LAB — PRO B NATRIURETIC PEPTIDE: Pro B Natriuretic peptide (BNP): 26.5 pg/mL (ref 0–125)

## 2012-01-03 MED ORDER — SODIUM CHLORIDE 0.9 % IV BOLUS (SEPSIS)
1000.0000 mL | Freq: Once | INTRAVENOUS | Status: AC
  Administered 2012-01-03: 1000 mL via INTRAVENOUS

## 2012-01-03 MED ORDER — METFORMIN HCL 1000 MG PO TABS: 1000.0000 mg | ORAL_TABLET | Freq: Two times a day (BID) | ORAL | Status: DC

## 2012-01-03 MED ORDER — INSULIN ASPART 100 UNIT/ML ~~LOC~~ SOLN
10.0000 [IU] | Freq: Once | SUBCUTANEOUS | Status: AC
  Administered 2012-01-03: 10 [IU] via INTRAVENOUS
  Filled 2012-01-03: qty 1

## 2012-01-03 NOTE — ED Provider Notes (Signed)
Medical screening examination/treatment/procedure(s) were performed by non-physician practitioner and as supervising physician I was immediately available for consultation/collaboration.  Carlisle Beers, MD 01/03/12 [phone]

## 2012-01-03 NOTE — Discharge Instructions (Signed)
Hyperglycemia  Hyperglycemia occurs when the glucose (sugar) in your blood is too high. Hyperglycemia can happen for many reasons, but it most often happens to people who do not know they have diabetes or are not managing their diabetes properly.   CAUSES   Whether you have diabetes or not, there are other causes of hyperglycemia. Hyperglycemia can occur when you have diabetes, but it can also occur in other situations that you might not be as aware of, such as:  Diabetes  · If you have diabetes and are having problems controlling your blood glucose, hyperglycemia could occur because of some of the following reasons:  · Not following your meal plan.  · Not taking your diabetes medications or not taking it properly.  · Exercising less or doing less activity than you normally do.  · Being sick.  Pre-diabetes  · This cannot be ignored. Before people develop Type 2 diabetes, they almost always have "pre-diabetes." This is when your blood glucose levels are higher than normal, but not yet high enough to be diagnosed as diabetes. Research has shown that some long-term damage to the body, especially the heart and circulatory system, may already be occurring during pre-diabetes. If you take action to manage your blood glucose when you have pre-diabetes, you may delay or prevent Type 2 diabetes from developing.  Stress  · If you have diabetes, you may be "diet" controlled or on oral medications or insulin to control your diabetes. However, you may find that your blood glucose is higher than usual in the hospital whether you have diabetes or not. This is often referred to as "stress hyperglycemia." Stress can elevate your blood glucose. This happens because of hormones put out by the body during times of stress. If stress has been the cause of your high blood glucose, it can be followed regularly by your caregiver. That way he/she can make sure your hyperglycemia does not continue to get worse or progress to  diabetes.  Steroids  · Steroids are medications that act on the infection fighting system (immune system) to block inflammation or infection. One side effect can be a rise in blood glucose. Most people can produce enough extra insulin to allow for this rise, but for those who cannot, steroids make blood glucose levels go even higher. It is not unusual for steroid treatments to "uncover" diabetes that is developing. It is not always possible to determine if the hyperglycemia will go away after the steroids are stopped. A special blood test called an A1c is sometimes done to determine if your blood glucose was elevated before the steroids were started.  SYMPTOMS  · Thirsty.  · Frequent urination.  · Dry mouth.  · Blurred vision.  · Tired or fatigue.  · Weakness.  · Sleepy.  · Tingling in feet or leg.  DIAGNOSIS   Diagnosis is made by monitoring blood glucose in one or all of the following ways:  · A1c test. This is a chemical found in your blood.  · Fingerstick blood glucose monitoring.  · Laboratory results.  TREATMENT   First, knowing the cause of the hyperglycemia is important before the hyperglycemia can be treated. Treatment may include, but is not be limited to:  · Education.  · Change or adjustment in medications.  · Change or adjustment in meal plan.  · Treatment for an illness, infection, etc.  · More frequent blood glucose monitoring.  · Change in exercise plan.  · Decreasing or stopping steroids.  ·   Lifestyle changes.  HOME CARE INSTRUCTIONS   · Test your blood glucose as directed.  · Exercise regularly. Your caregiver will give you instructions about exercise. Pre-diabetes or diabetes which comes on with stress is helped by exercising.  · Eat wholesome, balanced meals. Eat often and at regular, fixed times. Your caregiver or nutritionist will give you a meal plan to guide your sugar intake.  · Being at an ideal weight is important. If needed, losing as little as 10 to 15 pounds may help improve blood  glucose levels.  SEEK MEDICAL CARE IF:   · You have questions about medicine, activity, or diet.  · You continue to have symptoms (problems such as increased thirst, urination, or weight gain).  SEEK IMMEDIATE MEDICAL CARE IF:   · You are vomiting or have diarrhea.  · Your breath smells fruity.  · You are breathing faster or slower.  · You are very sleepy or incoherent.  · You have numbness, tingling, or pain in your feet or hands.  · You have chest pain.  · Your symptoms get worse even though you have been following your caregiver's orders.  · If you have any other questions or concerns.  Document Released: 03/23/2001 Document Revised: 09/16/2011 Document Reviewed: 05/19/2009  [employer]® Patient Information ©2012 [employer].

## 2012-01-03 NOTE — ED Provider Notes (Signed)
History     CSN: [id number]  Arrival date & time 01/02/12  2231   First MD Initiated Contact with Patient 01/03/12 0010      Chief Complaint  Patient presents with  . Weakness  . Chest Pain  . Abdominal Pain    (Consider location/radiation/quality/duration/timing/severity/associated sxs/prior treatment) HPI Comments: Patient here with multiple complaints after having been off all of her medication for about 1 year for DM, HTN and bi-polar - she states that she woke up around noon and got up - at that time she felt dizzy and noticed that the left side of her body was numb, she states she intially was unable to walk but now is able to if she drags her left leg - states she began to have crampy abdominal pain later in the day and then stabbing chest pain with shortness of breath, she denies headache, nausea, vomiting, diarrhea, constipation, vaginal discharge, dysuria.  She reports polydipsia and polyuria.  States lost her medicaid and could not longer pay for her medication.  Patient is a 31 y.o. female presenting with weakness, chest pain, and abdominal pain. The history is provided by the patient. No language interpreter was used.  Weakness The primary symptoms include dizziness, paresthesias, focal weakness and loss of sensation. Primary symptoms do not include headaches, syncope, loss of consciousness, altered mental status, seizures, visual change, speech change, memory loss, fever, nausea or vomiting. The symptoms began 6 to 12 hours ago. The symptoms are unchanged. The neurological symptoms are focal.  Dizziness also occurs with weakness. Dizziness does not occur with tinnitus, nausea or vomiting.  Additional symptoms include weakness, pain, vertigo and anxiety. Additional symptoms do not include neck stiffness, leg pain, loss of balance, photophobia, aura, hallucinations, nystagmus, taste disturbance, hyperacusis, hearing loss, tinnitus, irritability or dysphoric mood. Medical issues  also include diabetes.  Chest Pain Primary symptoms include shortness of breath, abdominal pain and dizziness. Pertinent negatives for primary symptoms include no fever, no syncope, no nausea, no vomiting and no altered mental status.  Dizziness also occurs with weakness. Dizziness does not occur with tinnitus, nausea or vomiting.  Associated symptoms include weakness.  Her past medical history is significant for diabetes.  Pertinent negatives for past medical history include no seizures.    Abdominal Pain The primary symptoms of the illness include abdominal pain and shortness of breath. The primary symptoms of the illness do not include fever, nausea or vomiting.  Significant associated medical issues include diabetes.    Past Medical History  Diagnosis Date  . Hypertension   . Diabetes mellitus   . Hyperlipidemia   . Pregnancy complication     HELP Syndrome  . Complete miscarriage   . Bipolar affective   . Depression     Past Surgical History  Procedure Date  . Cesarean section   . Cholecystectomy     No family history on file.  History  Substance Use Topics  . Smoking status: Current Everyday Smoker  . Smokeless tobacco: Not on file  . Alcohol Use: No    OB History    Grav Para Term Preterm Abortions TAB SAB Ect Mult Living                  Review of Systems  Constitutional: Negative for fever and irritability.  HENT: Negative for hearing loss, neck stiffness and tinnitus.   Eyes: Negative for photophobia.  Respiratory: Positive for shortness of breath.   Cardiovascular: Positive for chest pain. Negative for syncope.  Gastrointestinal: Positive for abdominal pain. Negative for nausea and vomiting.  Neurological: Positive for dizziness, vertigo, focal weakness, speech difficulty, weakness and paresthesias. Negative for speech change, seizures, loss of consciousness, headaches and loss of balance.  Psychiatric/Behavioral: Negative for hallucinations, memory  loss, dysphoric mood and altered mental status.  All other systems reviewed and are negative.    Allergies  Vicodin  Home Medications   Current Outpatient Rx  Name Route Sig Dispense Refill  . METFORMIN HCL 1000 MG PO TABS Oral Take 1 tablet (1,000 mg total) by mouth 2 (two) times daily. 60 tablet 0    BP 130/89  Pulse 95  Temp(Src) 98.6 F (37 C) (Oral)  Resp 18  SpO2 98%  LMP 12/27/2011  Physical Exam  Nursing note and vitals reviewed. Constitutional: She is oriented to person, place, and time. She appears well-developed and well-nourished. No distress.  HENT:  Head: Normocephalic and atraumatic.  Right Ear: External ear normal.  Left Ear: External ear normal.  Nose: Nose normal.  Mouth/Throat: Oropharynx is clear and moist. No oropharyngeal exudate.       No facial droop noted - tongue midline - no slurred speech  Eyes: Conjunctivae are normal. Pupils are equal, round, and reactive to light. No scleral icterus.  Neck: Normal range of motion. Neck supple.  Cardiovascular: Normal rate, regular rhythm and normal heart sounds.  Exam reveals no gallop and no friction rub.   No murmur heard. Pulmonary/Chest: Effort normal and breath sounds normal. No respiratory distress. She has no wheezes. She has no rales. She exhibits tenderness.    Musculoskeletal: Normal range of motion. She exhibits no edema and no tenderness.  Lymphadenopathy:    She has no cervical adenopathy.  Neurological: She is alert and oriented to person, place, and time. She is not disoriented. A sensory deficit is present. No cranial nerve deficit. GCS eye subscore is 4. GCS verbal subscore is 5. GCS motor subscore is 6. She displays no Babinski's sign on the right side. She displays no Babinski's sign on the left side.  Reflex Scores:      Tricep reflexes are 2+ on the right side and 2+ on the left side.      Bicep reflexes are 2+ on the right side and 2+ on the left side.      Patellar reflexes are 2+ on  the right side and 2+ on the left side.      Achilles reflexes are 2+ on the right side and 2+ on the left side.      Patient reports numbness to left side of body, normal DTR's, strength 0/5 on left in all upper and lower muscle groups unless the patient is distracted and she has normal tone,   Skin: Skin is warm and dry. No rash noted. No erythema. No pallor.  Psychiatric: She has a normal mood and affect. Her behavior is normal. Judgment and thought content normal.    ED Course  Procedures (including critical care time)  Labs Reviewed  CBC - Abnormal; Notable for the following:    WBC 11.0 (*)    Hemoglobin 15.5 (*)    MCHC 36.3 (*)    All other components within normal limits  BASIC METABOLIC PANEL - Abnormal; Notable for the following:    Sodium 132 (*)    Chloride 95 (*)    Glucose, Bld 409 (*)    All other components within normal limits  PRO B NATRIURETIC PEPTIDE  TROPONIN I  POCT PREGNANCY,  URINE   Dg Chest 2 View  01/02/2012  *RADIOLOGY REPORT*  Clinical Data: Left upper chest pain; shortness of breath.  History of hypertension.  CHEST - 2 VIEW  Comparison: Chest radiograph for 08/26/2010  Findings: The lungs are well-aerated and clear.  There is no evidence of focal opacification, pleural effusion or pneumothorax.  The heart is normal in size; the mediastinal contour is within normal limits.  No acute osseous abnormalities are seen.  Clips are noted within the right upper quadrant, reflecting prior cholecystectomy.  IMPRESSION: No acute cardiopulmonary process seen.  Original Report Authenticated By: Santa Lighter, M.D.   Ct Head Wo Contrast  01/03/2012  *RADIOLOGY REPORT*  Clinical Data: Weakness and dizziness; left-sided numbness. Transient inability to ambulate.  Transient slurred speech.  CT HEAD WITHOUT CONTRAST  Technique:  Contiguous axial images were obtained from the base of the skull through the vertex without contrast.  Comparison: CT of the head performed  10/06/2010  Findings: There is no evidence of acute infarction, mass lesion, or intra- or extra-axial hemorrhage on CT.  The posterior fossa, including the cerebellum, brainstem and fourth ventricle, is within normal limits.  The third and lateral ventricles, and basal ganglia are unremarkable in appearance.  The cerebral hemispheres are symmetric in appearance, with normal gray- white differentiation.  No mass effect or midline shift is seen.  There is no evidence of fracture; visualized osseous structures are unremarkable in appearance.  The visualized portions of the orbits are within normal limits.  The paranasal sinuses and mastoid air cells are well-aerated.  No significant soft tissue abnormalities are seen.  IMPRESSION: Unremarkable noncontrast CT of the head.  Original Report Authenticated By: Santa Lighter, M.D.    Date: 01/03/2012  Rate: 100  Rhythm: sinus tachycardia  QRS Axis: normal  Intervals: normal  ST/T Wave abnormalities: nonspecific ST changes  Conduction Disutrbances:none  Narrative Interpretation: Reviewed by Dr. Randal Buba  Old EKG Reviewed: unchanged    1. Hyperglycemia   2. Weakness of left side of body       MDM  Patient with weakness to the left side of her body, though seems to spare her face, CT is negative for CVA and she has strength on the left side with distraction techniques, because of this, I doubt stroke either acute or subacute.  Her EKG and enzymes were negative and she has reproducible chest wall pain which I believe to he the cause of her pain.  Her abdomen is soft and non-tender currently and I do not suspect a surgical emergency at this time.  Her glucose here is over 400 but she has a normal gap so there is no acidosis, I had given her a liter of fluids and insulin bolus and was planning to place the patient in the CDU for the hyperglycemia protocol when the patient stated that she had to leave.  She then signed out AMA but was encouraged to come back  should she so desire.  The only medication that she remembered the dosage on was the metformin which I have written her prescriptions for.  I again doubt acute CVA, cardiac basis for her chest pain or abdominal emergency situation.        Idalia Needle [place], [place] 01/03/12 [phone]

## 2012-02-11 ENCOUNTER — Encounter (HOSPITAL_COMMUNITY): Payer: Self-pay

## 2012-02-11 ENCOUNTER — Emergency Department (HOSPITAL_COMMUNITY)
Admission: EM | Admit: 2012-02-11 | Discharge: 2012-02-11 | Discharge status: Against Medical Advice | Payer: Self-pay | Attending: Emergency Medicine | Admitting: Emergency Medicine

## 2012-02-11 ENCOUNTER — Emergency Department (HOSPITAL_COMMUNITY): Payer: Self-pay

## 2012-02-11 DIAGNOSIS — Z79899 Other long term (current) drug therapy: Secondary | ICD-10-CM | POA: Insufficient documentation

## 2012-02-11 DIAGNOSIS — S61409A Unspecified open wound of unspecified hand, initial encounter: Secondary | ICD-10-CM | POA: Insufficient documentation

## 2012-02-11 DIAGNOSIS — F3130 Bipolar disorder, current episode depressed, mild or moderate severity, unspecified: Secondary | ICD-10-CM | POA: Insufficient documentation

## 2012-02-11 DIAGNOSIS — I10 Essential (primary) hypertension: Secondary | ICD-10-CM | POA: Insufficient documentation

## 2012-02-11 DIAGNOSIS — S61412A Laceration without foreign body of left hand, initial encounter: Secondary | ICD-10-CM

## 2012-02-11 DIAGNOSIS — W260XXA Contact with knife, initial encounter: Secondary | ICD-10-CM | POA: Insufficient documentation

## 2012-02-11 DIAGNOSIS — E785 Hyperlipidemia, unspecified: Secondary | ICD-10-CM | POA: Insufficient documentation

## 2012-02-11 DIAGNOSIS — W261XXA Contact with sword or dagger, initial encounter: Secondary | ICD-10-CM | POA: Insufficient documentation

## 2012-02-11 DIAGNOSIS — E119 Type 2 diabetes mellitus without complications: Secondary | ICD-10-CM | POA: Insufficient documentation

## 2012-02-11 MED ORDER — KETOROLAC TROMETHAMINE 60 MG/2ML IM SOLN: 60.0000 mg | Freq: Once | INTRAMUSCULAR | Status: DC

## 2012-02-11 MED ORDER — IBUPROFEN 800 MG PO TABS
800.0000 mg | ORAL_TABLET | Freq: Once | ORAL | Status: AC
  Administered 2012-02-11: 800 mg via ORAL
  Filled 2012-02-11: qty 1

## 2012-02-11 NOTE — ED Notes (Signed)
0.5" lac to L thenar thumb, bleeding controlled, flexion/extension strong, ROM painful but intact, cap refill <2sec, alert, NAD, calm, interactive.  Using knife to clean out vaccuum cleaner, punctured hand, occurred around 1740, Td UTD 3 yrs ago.(3 minor children with pt)

## 2012-02-11 NOTE — ED Notes (Signed)
Pt refused, IM injection, given alternative, had no problems with alternative, until pt next door to her got something she wanted, pt now question meds (med was the same that she refused), also refused suturing and staples, "wanting closure of a different type, does not like needles", asking for AMA form, AMA form signed, did not want to stay to speak with provider, or speak further. Pt given rationale of meds, differences b/w pts (hx, meds, VS, allergies), and closure types of different wounds d/t areas of body and other variables. walking out of ED.

## 2012-02-11 NOTE — ED Notes (Signed)
Hand dressed with kling, kling CDI, no active bleeding.

## 2012-02-11 NOTE — ED Notes (Signed)
Pt. Was cutting and stuck a knife into her  Lt. Thumb area. Pt. Reports that the knife went in about 1 inch and she pulled it out.  Bleeding controlled. +CNS

## 2012-02-11 NOTE — ED Notes (Signed)
Pt's children given Pretzels, apple sauce, cookies and Juice. No other needs voiced at this time.

## 2012-02-11 NOTE — ED Notes (Signed)
EDPA at [facility], visitors asked to step out.

## 2012-02-11 NOTE — ED Provider Notes (Signed)
History     CSN: [id number]  Arrival date & time 02/11/12  1836   First MD Initiated Contact with Patient 02/11/12 2011     8:49 PM HPI Reports she accidentally cut herself with a knife in her left inferior area. Reports knife went in approximately 1 inch. Wound is hemostatic. Reports severe pain. Denies numbness or tingling distal finger. Reports last tetanus was 3 years ago  Patient is a 31 y.o. female presenting with skin laceration. The history is provided by the patient.  Laceration  Incident onset: Just prior to arrival. The laceration is 2 cm in size. The laceration mechanism was a a clean knife. The pain is severe. The pain has been constant since onset. She reports no foreign bodies present. Her tetanus status is UTD.    Past Medical History  Diagnosis Date  . Hypertension   . Diabetes mellitus   . Hyperlipidemia   . Pregnancy complication     HELP Syndrome  . Complete miscarriage   . Bipolar affective   . Depression     Past Surgical History  Procedure Date  . Cesarean section   . Cholecystectomy     History reviewed. No pertinent family history.  History  Substance Use Topics  . Smoking status: Current Everyday Smoker  . Smokeless tobacco: Not on file  . Alcohol Use: No    OB History    Grav Para Term Preterm Abortions TAB SAB Ect Mult Living                  Review of Systems  Constitutional: Negative for fever and chills.  Skin: Positive for wound.       Laceration  Neurological: Negative for weakness and numbness.  All other systems reviewed and are negative.    Allergies  Vicodin  Home Medications   Current Outpatient Rx  Name Route Sig Dispense Refill  . IBUPROFEN 200 MG PO TABS Oral Take 400 mg by mouth every 6 (six) hours as needed. For pain    . METFORMIN HCL 1000 MG PO TABS Oral Take 1,000 mg by mouth 2 (two) times daily.      BP 129/99  Pulse 107  Temp(Src) 98.3 F (36.8 C) (Oral)  Resp 20  SpO2 98%  LMP  02/06/2012  Physical Exam  Vitals reviewed. Constitutional: She is oriented to person, place, and time. Vital signs are normal. She appears well-developed and well-nourished. No distress.  HENT:  Head: Normocephalic and atraumatic.  Eyes: Pupils are equal, round, and reactive to light.  Neck: Neck supple.  Pulmonary/Chest: Effort normal.  Neurological: She is alert and oriented to person, place, and time.  Skin: Skin is warm and dry. No rash noted. No erythema. No pallor.       2 cm laceration of left hand at thenar region to palpation. Full range of motion, hemostatic, capillary refill less than 2 seconds, does not appear to have foreign body bodies. However I did obtain an x-ray for further evaluation.  Psychiatric: She has a normal mood and affect. Her behavior is normal.    ED Course  Procedures  Results for orders placed during the hospital encounter of 01/02/12  CBC      Component Value Range   WBC 11.0 (*) 4.0 - 10.5 (K/uL)   RBC 4.89  3.87 - 5.11 (MIL/uL)   Hemoglobin 15.5 (*) 12.0 - 15.0 (g/dL)   HCT 42.7  36.0 - 46.0 (%)   MCV 87.3  78.0 - 100.0 (  fL)   MCH 31.7  26.0 - 34.0 (pg)   MCHC 36.3 (*) 30.0 - 36.0 (g/dL)   RDW 12.6  11.5 - 15.5 (%)   Platelets 265  150 - 400 (K/uL)  BASIC METABOLIC PANEL      Component Value Range   Sodium 132 (*) 135 - 145 (mEq/L)   Potassium 3.5  3.5 - 5.1 (mEq/L)   Chloride 95 (*) 96 - 112 (mEq/L)   CO2 24  19 - 32 (mEq/L)   Glucose, Bld 409 (*) 70 - 99 (mg/dL)   BUN 13  6 - 23 (mg/dL)   Creatinine, Ser 0.64  0.50 - 1.10 (mg/dL)   Calcium 9.3  8.4 - 10.5 (mg/dL)   GFR calc non Af Amer >90  >90 (mL/min)   GFR calc Af Amer >90  >90 (mL/min)  PRO B NATRIURETIC PEPTIDE      Component Value Range   Pro B Natriuretic peptide (BNP) 26.5  0 - 125 (pg/mL)  TROPONIN I      Component Value Range   Troponin I <0.30  <0.30 (ng/mL)  POCT PREGNANCY, URINE      Component Value Range   Preg Test, Ur NEGATIVE  NEGATIVE    Dg Hand 2 View  Left  02/11/2012  *RADIOLOGY REPORT*  Clinical Data: Laceration to the left hand from knife, with pain radiating up the arm.  LEFT HAND - 2 VIEW  Comparison: None.  Findings: There is no evidence of fracture or dislocation.  The joint spaces are preserved; the known soft tissue laceration is not well characterized on radiograph.  No radiopaque foreign bodies are seen.  The carpal rows are intact, and demonstrate normal alignment.  IMPRESSION: No evidence of fracture or dislocation.  No radiopaque foreign bodies seen.  Original Report Authenticated By: Santa Lighter, M.D.     MDM    9:30 PM Patient refused IM Toradol and does not want to have sutures. Will give ibuprofen and reassess after x-ray.  10:12 PM Patient states she's has only received ibuprofen. Pain is aggravated it she did not receive something "stronger". I informed her that we offered her IM Toradol and she refused. Patient ignores to acknowledge my comments and demands the nurse tech to speak to somebody higher up. I have informed Dr. Alvino Chapel and he will come see the patient.  10:28 PM Has left AMA prior to Dr. Alvino Chapel seeing the patient. No foreign bodies seen in x-ray     Sheliah Mends, PA-C 02/11/12 2355

## 2012-02-11 NOTE — ED Notes (Signed)
To xray, pt declining needles (sutures or IM injection), EDPA aware, ibuprofen ordered.

## 2012-02-12 NOTE — ED Provider Notes (Signed)
Medical screening examination/treatment/procedure(s) were performed by non-physician practitioner and as supervising physician I was immediately available for consultation/collaboration.  Jasper Riling. Alvino Chapel, MD 02/12/12 [phone]

## 2012-05-04 DIAGNOSIS — R079 Chest pain, unspecified: Secondary | ICD-10-CM | POA: Insufficient documentation

## 2012-05-04 DIAGNOSIS — R0602 Shortness of breath: Secondary | ICD-10-CM | POA: Insufficient documentation

## 2012-05-04 DIAGNOSIS — R000 Tachycardia, unspecified: Secondary | ICD-10-CM | POA: Insufficient documentation

## 2012-05-05 ENCOUNTER — Emergency Department (HOSPITAL_COMMUNITY): Admit: 2012-05-05 | Payer: Self-pay

## 2012-05-05 ENCOUNTER — Encounter (HOSPITAL_COMMUNITY): Payer: Self-pay | Admitting: *Deleted

## 2012-05-05 ENCOUNTER — Emergency Department (HOSPITAL_COMMUNITY)
Admission: EM | Admit: 2012-05-05 | Discharge: 2012-05-05 | Discharge status: Against Medical Advice | Payer: Self-pay | Attending: Emergency Medicine | Admitting: Emergency Medicine

## 2012-05-05 ENCOUNTER — Ambulatory Visit (HOSPITAL_COMMUNITY): Admission: RE | Admit: 2012-05-05 | Payer: Self-pay | Source: Ambulatory Visit

## 2012-05-05 LAB — CBC
MCHC: 36.8 g/dL — ABNORMAL HIGH (ref 30.0–36.0)
Platelets: 224 10*3/uL (ref 150–400)
RDW: 12.1 % (ref 11.5–15.5)
WBC: 12.4 10*3/uL — ABNORMAL HIGH (ref 4.0–10.5)

## 2012-05-05 LAB — POCT I-STAT, CHEM 8
Calcium, Ion: 1.12 mmol/L (ref 1.12–1.23)
Chloride: 100 mEq/L (ref 96–112)
Glucose, Bld: 476 mg/dL — ABNORMAL HIGH (ref 70–99)
HCT: 44 % (ref 36.0–46.0)
Hemoglobin: 15 g/dL (ref 12.0–15.0)
Potassium: 3.8 mEq/L (ref 3.5–5.1)

## 2012-05-05 LAB — POCT I-STAT TROPONIN I: Troponin i, poc: 0 ng/mL (ref 0.00–0.08)

## 2012-05-05 NOTE — ED Provider Notes (Addendum)
Patient had apparently come in presenting with chest pain and questionable abscess. When I walked in the room patient was dressed in that she was ready to go. I asked her why she wanted to leave and she said that she felt fine now and wanted to leave. I did advise her that her heart rate was told tachycardic on arrival and I felt to be better if I did assessment. She continued to say that she felt like she was fine and ready to go to she's currently leaving AMA.  I did urge her to stay given her abdnormal lab results, but she is adamant about leaving.  Advised risks of leaving.  Malvin Johns, MD 05/05/12 [id number]  Malvin Johns, MD 05/05/12 0145

## 2012-05-05 NOTE — ED Notes (Signed)
Pt c/o boil to perineum and chest.  Pt states she often has abscesses and lets them "pop on their own", but this time the pain is too great.  Pt also c/o L sided chest pain and sob since this am.

## 2012-05-05 NOTE — ED Notes (Signed)
PT. LEFT AMA , DR. Jerilynn Mages. BELFIN AWARE.

## 2012-05-18 DIAGNOSIS — Z0389 Encounter for observation for other suspected diseases and conditions ruled out: Secondary | ICD-10-CM | POA: Insufficient documentation

## 2012-05-19 ENCOUNTER — Emergency Department (HOSPITAL_COMMUNITY)
Admission: EM | Admit: 2012-05-19 | Discharge: 2012-05-19 | Discharge status: Against Medical Advice | Payer: Self-pay | Attending: Emergency Medicine | Admitting: Emergency Medicine

## 2012-05-19 ENCOUNTER — Encounter (HOSPITAL_COMMUNITY): Payer: Self-pay | Admitting: Emergency Medicine

## 2012-05-19 LAB — URINALYSIS, ROUTINE W REFLEX MICROSCOPIC
Glucose, UA: >1000 mg/dL — AB
Hgb urine dipstick: NEGATIVE
Ketones, ur: NEGATIVE mg/dL
Protein, ur: NEGATIVE mg/dL
Urobilinogen, UA: 0.2 mg/dL (ref 0.0–1.0)

## 2012-05-19 LAB — CBC WITH DIFFERENTIAL/PLATELET
Basophils Absolute: 0 10*3/uL (ref 0.0–0.1)
Basophils Relative: 0 % (ref 0–1)
Eosinophils Absolute: 0.4 10*3/uL (ref 0.0–0.7)
Eosinophils Relative: 4 % (ref 0–5)
HCT: 41.4 % (ref 36.0–46.0)
Lymphocytes Relative: 28 % (ref 12–46)
MCHC: 35.5 g/dL (ref 30.0–36.0)
MCV: 88.5 fL (ref 78.0–100.0)
Monocytes Absolute: 0.6 10*3/uL (ref 0.1–1.0)
Platelets: 213 10*3/uL (ref 150–400)
RDW: 12.7 % (ref 11.5–15.5)
WBC: 12 10*3/uL — ABNORMAL HIGH (ref 4.0–10.5)

## 2012-05-19 LAB — COMPREHENSIVE METABOLIC PANEL
ALT: 5 U/L (ref 0–35)
AST: 11 U/L (ref 0–37)
Albumin: 3.4 g/dL — ABNORMAL LOW (ref 3.5–5.2)
Calcium: 9.1 mg/dL (ref 8.4–10.5)
Creatinine, Ser: 0.71 mg/dL (ref 0.50–1.10)
GFR calc non Af Amer: >90 mL/min (ref >90)
Sodium: 132 mEq/L — ABNORMAL LOW (ref 135–145)
Total Protein: 7.3 g/dL (ref 6.0–8.3)

## 2012-05-19 LAB — URINE MICROSCOPIC-ADD ON

## 2012-05-19 NOTE — ED Notes (Signed)
Pt presents w/ c/o of fever, H/A, right sided abd pain. Some dysuria, urgency, frequency, denies hematuria. HX DM painful and reddened left great toe.

## 2012-10-09 ENCOUNTER — Encounter (HOSPITAL_COMMUNITY): Payer: Self-pay | Admitting: Emergency Medicine

## 2012-10-09 ENCOUNTER — Emergency Department (HOSPITAL_COMMUNITY)
Admission: EM | Admit: 2012-10-09 | Discharge: 2012-10-09 | Discharge status: Against Medical Advice | Payer: Medicaid Other | Attending: Emergency Medicine | Admitting: Emergency Medicine

## 2012-10-09 DIAGNOSIS — M545 Low back pain, unspecified: Secondary | ICD-10-CM | POA: Insufficient documentation

## 2012-10-09 DIAGNOSIS — Z3202 Encounter for pregnancy test, result negative: Secondary | ICD-10-CM | POA: Insufficient documentation

## 2012-10-09 DIAGNOSIS — R197 Diarrhea, unspecified: Secondary | ICD-10-CM | POA: Insufficient documentation

## 2012-10-09 DIAGNOSIS — R109 Unspecified abdominal pain: Secondary | ICD-10-CM | POA: Insufficient documentation

## 2012-10-09 DIAGNOSIS — N921 Excessive and frequent menstruation with irregular cycle: Secondary | ICD-10-CM | POA: Insufficient documentation

## 2012-10-09 LAB — URINALYSIS, ROUTINE W REFLEX MICROSCOPIC
Hgb urine dipstick: NEGATIVE
Nitrite: POSITIVE — AB
Protein, ur: NEGATIVE mg/dL
Specific Gravity, Urine: 1.02 (ref 1.005–1.030)
Urobilinogen, UA: 1 mg/dL (ref 0.0–1.0)

## 2012-10-09 LAB — CBC WITH DIFFERENTIAL/PLATELET
Basophils Absolute: 0 10*3/uL (ref 0.0–0.1)
Eosinophils Absolute: 0.3 10*3/uL (ref 0.0–0.7)
Eosinophils Relative: 3 % (ref 0–5)
Lymphocytes Relative: 31 % (ref 12–46)
MCV: 87.2 fL (ref 78.0–100.0)
Neutrophils Relative %: 61 % (ref 43–77)
Platelets: 194 10*3/uL (ref 150–400)
RDW: 12.2 % (ref 11.5–15.5)
WBC: 9.2 10*3/uL (ref 4.0–10.5)

## 2012-10-09 LAB — URINE MICROSCOPIC-ADD ON

## 2012-10-09 LAB — COMPREHENSIVE METABOLIC PANEL
ALT: 5 U/L (ref 0–35)
AST: 12 U/L (ref 0–37)
Calcium: 9 mg/dL (ref 8.4–10.5)
Potassium: 3.5 mEq/L (ref 3.5–5.1)
Sodium: 136 mEq/L (ref 135–145)
Total Protein: 6.8 g/dL (ref 6.0–8.3)

## 2012-10-09 NOTE — ED Notes (Signed)
Called pt. Back to room but pt. Was not in waiting room

## 2012-10-09 NOTE — ED Notes (Signed)
PT. REPORTS INTERMITTENT UPPER ABDOMINAL PAIN /LOW BACK PAIN FOR 2 WEEKS,  DIARRHEA TODAY , ALSO REPORTS MENSTRUAL PERIOD 3 TIMES THIS MONTH.

## 2012-10-12 LAB — URINE CULTURE

## 2012-10-13 NOTE — ED Notes (Signed)
+   Urine Chart appended per protocol MD.

## 2012-10-14 ENCOUNTER — Telehealth (HOSPITAL_COMMUNITY): Payer: Self-pay | Admitting: Emergency Medicine

## 2012-10-14 ENCOUNTER — Emergency Department (HOSPITAL_COMMUNITY)
Admission: EM | Admit: 2012-10-14 | Discharge: 2012-10-14 | Discharge status: Against Medical Advice | Payer: Medicaid Other | Attending: Emergency Medicine | Admitting: Emergency Medicine

## 2012-10-14 DIAGNOSIS — Z3202 Encounter for pregnancy test, result negative: Secondary | ICD-10-CM | POA: Insufficient documentation

## 2012-10-14 DIAGNOSIS — N898 Other specified noninflammatory disorders of vagina: Secondary | ICD-10-CM | POA: Insufficient documentation

## 2012-10-14 LAB — CBC WITH DIFFERENTIAL/PLATELET
Basophils Absolute: 0 10*3/uL (ref 0.0–0.1)
Basophils Relative: 0 % (ref 0–1)
Eosinophils Absolute: 0.3 10*3/uL (ref 0.0–0.7)
Eosinophils Relative: 4 % (ref 0–5)
MCH: 31.4 pg (ref 26.0–34.0)
MCHC: 35.8 g/dL (ref 30.0–36.0)
MCV: 87.7 fL (ref 78.0–100.0)
Neutrophils Relative %: 50 % (ref 43–77)
Platelets: 225 10*3/uL (ref 150–400)
RBC: 4.23 MIL/uL (ref 3.87–5.11)
RDW: 12.1 % (ref 11.5–15.5)

## 2012-10-14 LAB — BASIC METABOLIC PANEL
Calcium: 8.8 mg/dL (ref 8.4–10.5)
GFR calc Af Amer: >90 mL/min (ref >90)
GFR calc non Af Amer: >90 mL/min (ref >90)
Glucose, Bld: 261 mg/dL — ABNORMAL HIGH (ref 70–99)
Potassium: 3.5 mEq/L (ref 3.5–5.1)
Sodium: 137 mEq/L (ref 135–145)

## 2012-10-14 NOTE — ED Notes (Signed)
Called for patinet in waiting area, no response

## 2012-10-14 NOTE — ED Notes (Addendum)
Pt states, "for the past 2 weeks I have had my monthly cycle 5 times. One day I will have it then it will stop for a day then the next day it will start again.. I have used 20 overnight maxi pads in 2 weeks.  My hands and feet are swelling too. I have bad, bad cramps too" Last normal period 09/16/12 for 3 days. Does not use birth control or condoms, in a monogamous relationship  For 5 years.

## 2012-10-14 NOTE — ED Notes (Signed)
Chart returned from [place] office. Prescribed Keflex 500 mg BID for 7 days. #14. Prescribed by Margarita Mail PA-C.

## 2012-10-14 NOTE — ED Notes (Signed)
Pt called with no response in triage

## 2012-10-15 ENCOUNTER — Emergency Department (HOSPITAL_COMMUNITY): Payer: Medicaid Other

## 2012-10-15 ENCOUNTER — Encounter (HOSPITAL_COMMUNITY): Payer: Self-pay | Admitting: Emergency Medicine

## 2012-10-15 ENCOUNTER — Emergency Department (HOSPITAL_COMMUNITY)
Admission: EM | Admit: 2012-10-15 | Discharge: 2012-10-15 | Disposition: A | Discharge status: Home / Self Care | Payer: Medicaid Other | Attending: Emergency Medicine | Admitting: Emergency Medicine

## 2012-10-15 DIAGNOSIS — N938 Other specified abnormal uterine and vaginal bleeding: Secondary | ICD-10-CM | POA: Insufficient documentation

## 2012-10-15 DIAGNOSIS — N949 Unspecified condition associated with female genital organs and menstrual cycle: Secondary | ICD-10-CM | POA: Insufficient documentation

## 2012-10-15 DIAGNOSIS — F3189 Other bipolar disorder: Secondary | ICD-10-CM | POA: Insufficient documentation

## 2012-10-15 DIAGNOSIS — E119 Type 2 diabetes mellitus without complications: Secondary | ICD-10-CM | POA: Insufficient documentation

## 2012-10-15 DIAGNOSIS — R109 Unspecified abdominal pain: Secondary | ICD-10-CM | POA: Insufficient documentation

## 2012-10-15 DIAGNOSIS — Z8742 Personal history of other diseases of the female genital tract: Secondary | ICD-10-CM | POA: Insufficient documentation

## 2012-10-15 DIAGNOSIS — F329 Major depressive disorder, single episode, unspecified: Secondary | ICD-10-CM | POA: Insufficient documentation

## 2012-10-15 DIAGNOSIS — F3289 Other specified depressive episodes: Secondary | ICD-10-CM | POA: Insufficient documentation

## 2012-10-15 DIAGNOSIS — Z9889 Other specified postprocedural states: Secondary | ICD-10-CM | POA: Insufficient documentation

## 2012-10-15 DIAGNOSIS — I10 Essential (primary) hypertension: Secondary | ICD-10-CM | POA: Insufficient documentation

## 2012-10-15 DIAGNOSIS — E785 Hyperlipidemia, unspecified: Secondary | ICD-10-CM | POA: Insufficient documentation

## 2012-10-15 DIAGNOSIS — R5381 Other malaise: Secondary | ICD-10-CM | POA: Insufficient documentation

## 2012-10-15 DIAGNOSIS — F17200 Nicotine dependence, unspecified, uncomplicated: Secondary | ICD-10-CM | POA: Insufficient documentation

## 2012-10-15 DIAGNOSIS — R5383 Other fatigue: Secondary | ICD-10-CM | POA: Insufficient documentation

## 2012-10-15 DIAGNOSIS — Z79899 Other long term (current) drug therapy: Secondary | ICD-10-CM | POA: Insufficient documentation

## 2012-10-15 LAB — URINALYSIS, ROUTINE W REFLEX MICROSCOPIC
Glucose, UA: >1000 mg/dL — AB
Ketones, ur: NEGATIVE mg/dL
Leukocytes, UA: NEGATIVE
Protein, ur: NEGATIVE mg/dL
Urobilinogen, UA: 0.2 mg/dL (ref 0.0–1.0)

## 2012-10-15 LAB — CBC WITH DIFFERENTIAL/PLATELET
Basophils Absolute: 0 10*3/uL (ref 0.0–0.1)
Basophils Relative: 0 % (ref 0–1)
Eosinophils Relative: 4 % (ref 0–5)
HCT: 36 % (ref 36.0–46.0)
MCHC: 35.6 g/dL (ref 30.0–36.0)
MCV: 87.8 fL (ref 78.0–100.0)
Monocytes Absolute: 0.5 10*3/uL (ref 0.1–1.0)
Platelets: 207 10*3/uL (ref 150–400)
RDW: 12.1 % (ref 11.5–15.5)
WBC: 6.7 10*3/uL (ref 4.0–10.5)

## 2012-10-15 LAB — WET PREP, GENITAL

## 2012-10-15 LAB — URINE MICROSCOPIC-ADD ON

## 2012-10-15 LAB — POCT PREGNANCY, URINE: Preg Test, Ur: NEGATIVE

## 2012-10-15 LAB — GLUCOSE, CAPILLARY: Glucose-Capillary: 288 mg/dL — ABNORMAL HIGH (ref 70–99)

## 2012-10-15 MED ORDER — IBUPROFEN 600 MG PO TABS: 600.0000 mg | ORAL_TABLET | Freq: Four times a day (QID) | ORAL | Status: DC | PRN

## 2012-10-15 MED ORDER — OXYCODONE-ACETAMINOPHEN 5-325 MG PO TABS
1.0000 | ORAL_TABLET | Freq: Once | ORAL | Status: AC
  Administered 2012-10-15: 1 via ORAL
  Filled 2012-10-15: qty 1

## 2012-10-15 MED ORDER — HYDROCODONE-ACETAMINOPHEN 5-325 MG PO TABS
2.0000 | ORAL_TABLET | Freq: Once | ORAL | Status: AC
  Administered 2012-10-15: 2 via ORAL
  Filled 2012-10-15: qty 2

## 2012-10-15 MED ORDER — IBUPROFEN 200 MG PO TABS
600.0000 mg | ORAL_TABLET | Freq: Once | ORAL | Status: AC
  Administered 2012-10-15: 600 mg via ORAL
  Filled 2012-10-15: qty 3

## 2012-10-15 MED ORDER — MORPHINE SULFATE 4 MG/ML IJ SOLN
4.0000 mg | Freq: Once | INTRAMUSCULAR | Status: AC
  Administered 2012-10-15: 4 mg via INTRAVENOUS
  Filled 2012-10-15: qty 1

## 2012-10-15 MED ORDER — SODIUM CHLORIDE 0.9 % IV BOLUS (SEPSIS)
1000.0000 mL | Freq: Once | INTRAVENOUS | Status: AC
  Administered 2012-10-15: 1000 mL via INTRAVENOUS

## 2012-10-15 MED ORDER — OXYCODONE-ACETAMINOPHEN 5-325 MG PO TABS: 1.0000 | ORAL_TABLET | Freq: Four times a day (QID) | ORAL | Status: DC | PRN

## 2012-10-15 NOTE — ED Provider Notes (Signed)
History     CSN: [id number]  Arrival date & time 10/15/12  1032   First MD Initiated Contact with Patient 10/15/12 1106      Chief Complaint  Patient presents with  . Menstrual Problem    (Consider location/radiation/quality/duration/timing/severity/associated sxs/prior treatment) HPI Comments: Pt with no medical hx comes in with cc of vaginal bleeding. Pt stated that her normal LMP was in December - since then, she has had off and on bleeding intermittently. Sometimes she has relatively heavy bleeding, other times she has just spotting. She has no hx of similar sx before, no hx of bleeding disorder and no hx of trauma.   The history is provided by the patient.    Past Medical History  Diagnosis Date  . Hypertension   . Diabetes mellitus   . Hyperlipidemia   . Pregnancy complication     HELP Syndrome  . Complete miscarriage   . Bipolar affective   . Depression     Past Surgical History  Procedure Date  . Cesarean section   . Cholecystectomy   . Tubal ligation     No family history on file.  History  Substance Use Topics  . Smoking status: Current Every Day Smoker -- 0.5 packs/day    Types: Cigarettes  . Smokeless tobacco: Never Used  . Alcohol Use: No    OB History    Grav Para Term Preterm Abortions TAB SAB Ect Mult Living                  Review of Systems  Constitutional: Positive for fatigue. Negative for activity change.  HENT: Negative for neck pain.   Respiratory: Negative for shortness of breath.   Cardiovascular: Negative for chest pain.  Gastrointestinal: Positive for abdominal pain. Negative for nausea and vomiting.  Genitourinary: Positive for vaginal bleeding. Negative for dysuria, urgency, hematuria, flank pain and menstrual problem.  Neurological: Negative for headaches.    Allergies  Vicodin  Home Medications   Current Outpatient Rx  Name  Route  Sig  Dispense  Refill  . IBUPROFEN 200 MG PO TABS   Oral   Take 400 mg by mouth  every 6 (six) hours as needed. For pain         . METFORMIN HCL 1000 MG PO TABS   Oral   Take 1,000 mg by mouth 2 (two) times daily.          . IBUPROFEN 600 MG PO TABS   Oral   Take 1 tablet (600 mg total) by mouth every 6 (six) hours as needed for pain.   30 tablet   0   . OXYCODONE-ACETAMINOPHEN 5-325 MG PO TABS   Oral   Take 1 tablet by mouth every 6 (six) hours as needed for pain (BREAK THROUGH PAIN ONLY).   6 tablet   0     BP 147/97  Pulse 92  Temp 97.8 F (36.6 C) (Oral)  Resp 16  Ht 5\' 9"  (1.753 m)  Wt 140 lb (63.504 kg)  BMI 20.67 kg/m2  SpO2 100%  LMP 10/04/2012  Physical Exam  Constitutional: She is oriented to person, place, and time. She appears well-developed.  HENT:  Head: Normocephalic and atraumatic.  Eyes: Conjunctivae normal and EOM are normal. Pupils are equal, round, and reactive to light.  Neck: Normal range of motion. Neck supple.  Cardiovascular: Normal rate, regular rhythm, normal heart sounds and intact distal pulses.   No murmur heard. Pulmonary/Chest: Effort normal. No respiratory  distress. She has no wheezes.  Abdominal: Soft. Bowel sounds are normal. She exhibits no distension. There is tenderness. There is no rebound and no guarding.       Suprapubic tenderness.  Genitourinary: Vagina normal and uterus normal.       External exam - normal, no lesions Speculum exam: Pt has some white discharge, NO BLEEDING NOTED Bimanual exam: Patient has no CMT, no adnexal tenderness or fullness and cervical os is closed  Neurological: She is alert and oriented to person, place, and time.  Skin: Skin is warm and dry.    ED Course  Procedures (including critical care time)  Labs Reviewed  URINALYSIS, ROUTINE W REFLEX MICROSCOPIC - Abnormal; Notable for the following:    APPearance CLOUDY (*)     Glucose, UA >1000 (*)     Hgb urine dipstick LARGE (*)     All other components within normal limits  WET PREP, GENITAL - Abnormal; Notable for the  following:    Clue Cells Wet Prep HPF POC FEW (*)     WBC, Wet Prep HPF POC FEW (*)     All other components within normal limits  URINE MICROSCOPIC-ADD ON - Abnormal; Notable for the following:    Bacteria, UA MANY (*)     All other components within normal limits  GLUCOSE, CAPILLARY - Abnormal; Notable for the following:    Glucose-Capillary 288 (*)     All other components within normal limits  POCT PREGNANCY, URINE  CBC WITH DIFFERENTIAL  GC/CHLAMYDIA PROBE AMP   US Transvaginal Non-ob  10/15/2012  *RADIOLOGY REPORT*  Clinical Data: Vaginal bleeding.  Prior cesarean section and tubal ligation.  TRANSABDOMINAL AND TRANSVAGINAL ULTRASOUND OF PELVIS  Technique:  Both transabdominal and transvaginal ultrasound examinations of the pelvis were performed.  Transabdominal technique was performed for global imaging of the pelvis including uterus, ovaries, adnexal regions, and pelvic cul-de-sac.  It was necessary to proceed with endovaginal exam following the transabdominal exam to visualize the endometrium and ovaries.  Comparison:  01/05/2009  Findings: Uterus:  8.6 x 6.1 x 4.2 cm.  Anteverted, anteflexed.  No focal abnormality.  Endometrium: 6 mm.  Trilaminar in appearance of focal abnormality.  Right ovary: 2.9 x 2.0 x 1.9 cm.  Normal.  Left ovary: 3.0 x 2.5 x 2.5 cm.  Normal.  Other Findings:  No free fluid  IMPRESSION: Normal study.  No evidence of pelvic mass or other significant abnormality.   Original Report Authenticated By: Conchita Paris, M.D.    US Pelvis Complete  10/15/2012  *RADIOLOGY REPORT*  Clinical Data: Vaginal bleeding.  Prior cesarean section and tubal ligation.  TRANSABDOMINAL AND TRANSVAGINAL ULTRASOUND OF PELVIS  Technique:  Both transabdominal and transvaginal ultrasound examinations of the pelvis were performed.  Transabdominal technique was performed for global imaging of the pelvis including uterus, ovaries, adnexal regions, and pelvic cul-de-sac.  It was necessary to proceed  with endovaginal exam following the transabdominal exam to visualize the endometrium and ovaries.  Comparison:  01/05/2009  Findings: Uterus:  8.6 x 6.1 x 4.2 cm.  Anteverted, anteflexed.  No focal abnormality.  Endometrium: 6 mm.  Trilaminar in appearance of focal abnormality.  Right ovary: 2.9 x 2.0 x 1.9 cm.  Normal.  Left ovary: 3.0 x 2.5 x 2.5 cm.  Normal.  Other Findings:  No free fluid  IMPRESSION: Normal study.  No evidence of pelvic mass or other significant abnormality.   Original Report Authenticated By: Conchita Paris, M.D.  1. DUB (dysfunctional uterine bleeding)       MDM  Pt with no risk factors for cervical CA, neg pregnancy test comes in with cc of vaginal bleeding. GU ecam unremarkable - but she does have suprapubic pain. [place] ordered to ensure there is no hemorrhagic cyst, large fibroid. Likely DUB.  Varney Biles, MD 10/15/12 [phone]

## 2012-10-15 NOTE — ED Notes (Signed)
Pt states started menses 2 wks ago, she will have brownish-red discharge on a day to day 1/2 then it stops, for a day then starts over again. This has been going on for 2 weeks, sometimes she does see clots in the discharge. Pt is also having terrible cramps "like I am having a miscarriage"

## 2012-10-16 LAB — GC/CHLAMYDIA PROBE AMP: GC Probe RNA: NEGATIVE

## 2012-10-21 NOTE — ED Notes (Signed)
Called in a new Rx for Keflex 500mg  BID x 7 days, #14, per Margarita Mail PA, to [facility] at [phone]

## 2012-12-01 ENCOUNTER — Encounter (HOSPITAL_COMMUNITY): Payer: Self-pay

## 2012-12-01 ENCOUNTER — Emergency Department (HOSPITAL_COMMUNITY): Payer: Medicaid Other

## 2012-12-01 ENCOUNTER — Encounter (HOSPITAL_COMMUNITY): Payer: Self-pay | Admitting: Emergency Medicine

## 2012-12-01 ENCOUNTER — Emergency Department (HOSPITAL_COMMUNITY)
Admission: EM | Admit: 2012-12-01 | Discharge: 2012-12-01 | Disposition: A | Discharge status: Home / Self Care | Payer: Medicaid Other | Source: Home / Self Care | Attending: Emergency Medicine | Admitting: Emergency Medicine

## 2012-12-01 ENCOUNTER — Emergency Department (HOSPITAL_COMMUNITY)
Admission: EM | Admit: 2012-12-01 | Discharge: 2012-12-01 | Disposition: A | Discharge status: Home / Self Care | Payer: Medicaid Other | Attending: Emergency Medicine | Admitting: Emergency Medicine

## 2012-12-01 DIAGNOSIS — Z3202 Encounter for pregnancy test, result negative: Secondary | ICD-10-CM | POA: Insufficient documentation

## 2012-12-01 DIAGNOSIS — Z9851 Tubal ligation status: Secondary | ICD-10-CM | POA: Insufficient documentation

## 2012-12-01 DIAGNOSIS — B9689 Other specified bacterial agents as the cause of diseases classified elsewhere: Secondary | ICD-10-CM

## 2012-12-01 DIAGNOSIS — F17200 Nicotine dependence, unspecified, uncomplicated: Secondary | ICD-10-CM | POA: Insufficient documentation

## 2012-12-01 DIAGNOSIS — N83202 Unspecified ovarian cyst, left side: Secondary | ICD-10-CM

## 2012-12-01 DIAGNOSIS — Z794 Long term (current) use of insulin: Secondary | ICD-10-CM | POA: Insufficient documentation

## 2012-12-01 DIAGNOSIS — N760 Acute vaginitis: Secondary | ICD-10-CM

## 2012-12-01 DIAGNOSIS — Z862 Personal history of diseases of the blood and blood-forming organs and certain disorders involving the immune mechanism: Secondary | ICD-10-CM | POA: Insufficient documentation

## 2012-12-01 DIAGNOSIS — Z8639 Personal history of other endocrine, nutritional and metabolic disease: Secondary | ICD-10-CM | POA: Insufficient documentation

## 2012-12-01 DIAGNOSIS — K5732 Diverticulitis of large intestine without perforation or abscess without bleeding: Secondary | ICD-10-CM

## 2012-12-01 DIAGNOSIS — Z9089 Acquired absence of other organs: Secondary | ICD-10-CM | POA: Insufficient documentation

## 2012-12-01 DIAGNOSIS — I10 Essential (primary) hypertension: Secondary | ICD-10-CM | POA: Insufficient documentation

## 2012-12-01 DIAGNOSIS — N83209 Unspecified ovarian cyst, unspecified side: Secondary | ICD-10-CM | POA: Insufficient documentation

## 2012-12-01 DIAGNOSIS — K5792 Diverticulitis of intestine, part unspecified, without perforation or abscess without bleeding: Secondary | ICD-10-CM

## 2012-12-01 DIAGNOSIS — E119 Type 2 diabetes mellitus without complications: Secondary | ICD-10-CM | POA: Insufficient documentation

## 2012-12-01 DIAGNOSIS — R300 Dysuria: Secondary | ICD-10-CM | POA: Insufficient documentation

## 2012-12-01 DIAGNOSIS — Z79899 Other long term (current) drug therapy: Secondary | ICD-10-CM | POA: Insufficient documentation

## 2012-12-01 DIAGNOSIS — Z8659 Personal history of other mental and behavioral disorders: Secondary | ICD-10-CM | POA: Insufficient documentation

## 2012-12-01 LAB — POCT URINALYSIS DIP (DEVICE)
Nitrite: NEGATIVE
Protein, ur: NEGATIVE mg/dL
Urobilinogen, UA: 0.2 mg/dL (ref 0.0–1.0)
pH: 5.5 (ref 5.0–8.0)

## 2012-12-01 LAB — WET PREP, GENITAL: Yeast Wet Prep HPF POC: NONE SEEN

## 2012-12-01 LAB — CBC WITH DIFFERENTIAL/PLATELET
Basophils Absolute: 0.1 10*3/uL (ref 0.0–0.1)
HCT: 42.1 % (ref 36.0–46.0)
Hemoglobin: 15.9 g/dL — ABNORMAL HIGH (ref 12.0–15.0)
Lymphocytes Relative: 32 % (ref 12–46)
Lymphs Abs: 3.6 10*3/uL (ref 0.7–4.0)
Monocytes Absolute: 0.6 10*3/uL (ref 0.1–1.0)
Monocytes Relative: 6 % (ref 3–12)
Neutro Abs: 6.7 10*3/uL (ref 1.7–7.7)
RBC: 4.88 MIL/uL (ref 3.87–5.11)
RDW: 12.4 % (ref 11.5–15.5)
WBC: 11.3 10*3/uL — ABNORMAL HIGH (ref 4.0–10.5)

## 2012-12-01 LAB — POCT I-STAT, CHEM 8
BUN: 13 mg/dL (ref 6–23)
Chloride: 99 mEq/L (ref 96–112)
Potassium: 3.6 mEq/L (ref 3.5–5.1)
Sodium: 133 mEq/L — ABNORMAL LOW (ref 135–145)

## 2012-12-01 LAB — PREGNANCY, URINE: Preg Test, Ur: NEGATIVE

## 2012-12-01 LAB — COMPREHENSIVE METABOLIC PANEL
AST: 10 U/L (ref 0–37)
CO2: 23 mEq/L (ref 19–32)
Chloride: 92 mEq/L — ABNORMAL LOW (ref 96–112)
Creatinine, Ser: 0.74 mg/dL (ref 0.50–1.10)
GFR calc non Af Amer: >90 mL/min (ref >90)
Glucose, Bld: 463 mg/dL — ABNORMAL HIGH (ref 70–99)
Total Bilirubin: 0.5 mg/dL (ref 0.3–1.2)

## 2012-12-01 LAB — GLUCOSE, CAPILLARY: Glucose-Capillary: 449 mg/dL — ABNORMAL HIGH (ref 70–99)

## 2012-12-01 MED ORDER — METRONIDAZOLE 500 MG PO TABS
500.0000 mg | ORAL_TABLET | Freq: Once | ORAL | Status: AC
  Administered 2012-12-01: 500 mg via ORAL
  Filled 2012-12-01: qty 1

## 2012-12-01 MED ORDER — OXYCODONE-ACETAMINOPHEN 5-325 MG PO TABS
1.0000 | ORAL_TABLET | Freq: Once | ORAL | Status: AC
  Administered 2012-12-01: 1 via ORAL
  Filled 2012-12-01: qty 1

## 2012-12-01 MED ORDER — HYDROMORPHONE HCL PF 1 MG/ML IJ SOLN
1.0000 mg | Freq: Once | INTRAMUSCULAR | Status: AC
  Administered 2012-12-01: 1 mg via INTRAVENOUS
  Filled 2012-12-01: qty 1

## 2012-12-01 MED ORDER — OXYCODONE-ACETAMINOPHEN 5-325 MG PO TABS: 1.0000 | ORAL_TABLET | ORAL | Status: DC | PRN

## 2012-12-01 MED ORDER — GI COCKTAIL ~~LOC~~
30.0000 mL | Freq: Once | ORAL | Status: AC
  Administered 2012-12-01: 30 mL via ORAL
  Filled 2012-12-01: qty 30

## 2012-12-01 MED ORDER — KETOROLAC TROMETHAMINE 60 MG/2ML IM SOLN
60.0000 mg | Freq: Once | INTRAMUSCULAR | Status: AC
  Administered 2012-12-01: 60 mg via INTRAMUSCULAR

## 2012-12-01 MED ORDER — IOHEXOL 300 MG/ML  SOLN
100.0000 mL | Freq: Once | INTRAMUSCULAR | Status: AC | PRN
  Administered 2012-12-01: 100 mL via INTRAVENOUS

## 2012-12-01 MED ORDER — KETOROLAC TROMETHAMINE 60 MG/2ML IM SOLN
INTRAMUSCULAR | Status: AC
  Filled 2012-12-01: qty 2

## 2012-12-01 MED ORDER — SODIUM CHLORIDE 0.9 % IV SOLN
1000.0000 mL | Freq: Once | INTRAVENOUS | Status: AC
  Administered 2012-12-01: 1000 mL via INTRAVENOUS

## 2012-12-01 MED ORDER — METRONIDAZOLE 500 MG PO TABS: 500.0000 mg | ORAL_TABLET | Freq: Two times a day (BID) | ORAL | Status: DC

## 2012-12-01 MED ORDER — SODIUM CHLORIDE 0.9 % IV SOLN
INTRAVENOUS | Status: DC
  Administered 2012-12-01: 16:00:00 via INTRAVENOUS

## 2012-12-01 MED ORDER — IOHEXOL 300 MG/ML  SOLN
25.0000 mL | INTRAMUSCULAR | Status: AC
  Administered 2012-12-01: 25 mL via ORAL

## 2012-12-01 MED ORDER — ONDANSETRON HCL 4 MG/2ML IJ SOLN
4.0000 mg | Freq: Once | INTRAMUSCULAR | Status: AC
  Administered 2012-12-01: 4 mg via INTRAVENOUS
  Filled 2012-12-01: qty 2

## 2012-12-01 NOTE — ED Notes (Signed)
Pt able to eat with no issues.

## 2012-12-01 NOTE — ED Notes (Signed)
Pt reports every time she tries to eat or drink something the pain increases and she vomits. Pt denies nausea. Pt reports she wants antacid medication.

## 2012-12-01 NOTE — ED Notes (Signed)
Pt c/o left sided flank pain x 1 week worse today; pt sent from [facility] for eval

## 2012-12-01 NOTE — ED Notes (Signed)
POCT CBG resulted 449; Janett Billow, RN notified

## 2012-12-01 NOTE — ED Notes (Signed)
Pt c/o LLQ abd pain that radiates into left lower back. Pt sts she went to urgent care and they dx her with diverticulitis but sent her over here because her bld sugar was elevated. Pt sts when she urinates it burns. Pt in nad, resp e/u, skin warm and dry. Pt sts she has been taking her DM medication. Pt sts she "normally runs in 600s and a bld sugar of 449 is good".

## 2012-12-01 NOTE — ED Notes (Signed)
Pt returned from CT °

## 2012-12-01 NOTE — ED Notes (Signed)
Pt finished drinking 1st cup of oral CT contrast. CT made aware. Pt started on 2nd cup.

## 2012-12-01 NOTE — ED Provider Notes (Signed)
Chief Complaint  Patient presents with  . Flank Pain    History of Present Illness:    Kathy Phelps is a 32 year old female with type 2 diabetes who has had a one-week history of pain on and urination, urinary frequency, and urgency. Her urine has appeared somewhat cloudy and had an odor. There's been no blood in her urine. Since today she's had lower abdominal and lower back pain. This is severe. She denies any fever, chills, nausea, or vomiting. The patient states she's had irregular menses. She's been bleeding for about 3 months but has stopped for the past 2 weeks. She is sexually active but has had a tubal ligation. She denies any history of similar pain in the past, PID, GYN problems, diverticulosis, diverticulitis, but she has had a history of urinary tract infections, none of which have felt quite like this.  Review of Systems:  Other than noted above, the patient denies any of the following symptoms: Constitutional:  No fever, chills, fatigue, weight loss or anorexia. Lungs:  No cough or shortness of breath. Heart:  No chest pain, palpitations, syncope or edema.  No cardiac history. Abdomen:  No nausea, vomiting, hematememesis, melena, diarrhea, or hematochezia. GU:  No dysuria, frequency, urgency, or hematuria. Gyn:  No vaginal discharge, itching, abnormal bleeding, dyspareunia, or pelvic pain.  [place]:  Past medical history, family history, social history, meds, and allergies were reviewed along with nurse's notes.  No prior abdominal surgeries, past history of GI problems, STDs or GYN problems.  No history of aspirin or NSAID use.  No excessive alcohol intake.  Physical Exam:   Vital signs:  BP 135/100  Pulse 107  Temp(Src) 98.1 F (36.7 C) (Oral)  Resp 20  SpO2 99% Gen:  Alert, oriented, in no distress. Lungs:  Breath sounds clear and equal bilaterally.  No wheezes, rales or rhonchi. Heart:  Regular rhythm.  No gallops or murmers.   Abdomen:  Soft, flat, nondistended. There  is tenderness to palpation in the right and left lower quadrants of the abdomen, more so in the left than right with guarding and rebound on the left but not in the right. Bowel sounds are diminished. There was no organomegaly or mass. Skin:  Clear, warm and dry.  No rash.  Labs:   Results for orders placed during the hospital encounter of 12/01/12  POCT URINALYSIS DIP (DEVICE)      Result Value Range   Glucose, UA 500 (*) NEGATIVE mg/dL   Bilirubin Urine NEGATIVE  NEGATIVE   Ketones, ur NEGATIVE  NEGATIVE mg/dL   Specific Gravity, Urine <=1.005  1.005 - 1.030   Hgb urine dipstick TRACE (*) NEGATIVE   pH 5.5  5.0 - 8.0   Protein, ur NEGATIVE  NEGATIVE mg/dL   Urobilinogen, UA 0.2  0.0 - 1.0 mg/dL   Nitrite NEGATIVE  NEGATIVE   Leukocytes, UA TRACE (*) NEGATIVE    Assessment:  The encounter diagnosis was Diverticulitis.  The patient's symptoms and exam are highly suspicious for diverticulitis and she is at high risk, being a diabetic, so I think it's best that she be evaluated at the emergency department.  Plan:   1.  The following meds were prescribed:   New Prescriptions   No medications on file   2.  The patient was transferred to the emergency department in stable condition via shuttle.  Medical Decision Making:  32 year old diabetic female presents with a 1 week history of pain on end urination and  a 1 day history of severe bilateral lower abdominal pain radiating to back.  On exam she has bilateral lower abdominal tenderness, worse on left than right with guarding on left and decreased bowel sounds.  Her UA was fairly unremarkable, showing only trace WBCs and RBCs.  My impression is diverticulitis and she is a high risk being a diabetic.     Harden Mo, MD 12/01/12 [phone]

## 2012-12-01 NOTE — ED Notes (Signed)
Reports pain low back , frequency urination x 1 week; concern for kidney issues

## 2012-12-01 NOTE — ED Provider Notes (Signed)
History     CSN: [id number]  Arrival date & time 12/01/12  1431   First MD Initiated Contact with Patient 12/01/12 1550      Chief Complaint  Patient presents with  . Flank Pain    (Consider location/radiation/quality/duration/timing/severity/associated sxs/prior treatment) HPI Comments: Patient is a 32 year old woman with diabetes and hypertension. She had the onset of pain at the end of urination about a week ago. She took over-the-counter medicines for her heat dysuria for a week without relief. She talked to the pharmacist today who said that she needed to have medical evaluation because she had failed on outpatient medications. She was seen at the [facility], where a urinalysis was negative, a tentative diagnosis of diverticulitis was offered, and the patient was referred to [facility] for evaluation.  Patient is a 32 y.o. female presenting with abdominal pain. The history is provided by the patient and medical records. No language interpreter was used.  Abdominal Pain Pain location:  LLQ Pain quality: cramping   Pain radiates to:  Does not radiate Pain severity:  Severe Onset quality:  Gradual Duration:  1 week Timing:  Intermittent Progression:  Worsening Chronicity:  New Relieved by: No relief with over-the-counter meds for dysuria. Worsened by:  Nothing tried Ineffective treatments: OTC meds for dysuria. Associated symptoms: dysuria   Associated symptoms: no fever and no nausea  Vaginal bleeding: she had a menstrual period that lasted the past 3 months with an appointment in bleeding and spotting and bleeding and stopping.     Past Medical History  Diagnosis Date  . Hypertension   . Diabetes mellitus   . Hyperlipidemia   . Pregnancy complication     HELP Syndrome  . Complete miscarriage   . Bipolar affective   . Depression     Past Surgical History  Procedure Laterality Date  . Cesarean section    . Cholecystectomy    . Tubal ligation       History reviewed. No pertinent family history.  History  Substance Use Topics  . Smoking status: Current Every Day Smoker -- 0.50 packs/day    Types: Cigarettes  . Smokeless tobacco: Never Used  . Alcohol Use: No    OB History   Grav Para Term Preterm Abortions TAB SAB Ect Mult Living                  Review of Systems  Constitutional: Negative for fever.  Gastrointestinal: Positive for abdominal pain. Negative for nausea.  Genitourinary: Positive for dysuria and flank pain. Vaginal bleeding: she had a menstrual period that lasted the past 3 months with an appointment in bleeding and spotting and bleeding and stopping.    Allergies  Vicodin  Home Medications   Current Outpatient Rx  Name  Route  Sig  Dispense  Refill  . insulin glargine (LANTUS) 100 UNIT/ML injection   Subcutaneous   Inject 26 Units into the skin at bedtime.         . metFORMIN (GLUCOPHAGE) 1000 MG tablet   Oral   Take 1,000 mg by mouth 2 (two) times daily.            BP 122/82  Pulse 83  Temp(Src) 98.1 F (36.7 C) (Oral)  Resp 18  SpO2 100%  Physical Exam  Nursing note and vitals reviewed. Constitutional: She is oriented to person, place, and time. She appears well-developed and well-nourished. No distress.  HENT:  Head: Normocephalic and atraumatic.  Right Ear: External  ear normal.  Left Ear: External ear normal.  Mouth/Throat: Oropharynx is clear and moist.  Eyes: Conjunctivae and EOM are normal. Pupils are equal, round, and reactive to light.  Neck: Normal range of motion. Neck supple.  Cardiovascular: Normal rate, regular rhythm and normal heart sounds.   Pulmonary/Chest: Effort normal and breath sounds normal. No respiratory distress.  Abdominal: Soft.  She has left lower quadrant tenderness, no mass rebound or rigidity.  Genitourinary:  Normal female external genitalia.  Has white vaginal discharge.  Specimens obtained for GC/chlamydia probes.  Bimanual exam shows uterine  and left adnexal tenderness, no mass.  Musculoskeletal: Normal range of motion. She exhibits no edema and no tenderness.  Neurological: She is alert and oriented to person, place, and time.  No sensory or motor deficit.  Skin: Skin is warm and dry.  Psychiatric: She has a normal mood and affect. Her behavior is normal.    ED Course  Procedures (including critical care time)   8:18 PM Results for orders placed during the hospital encounter of 12/01/12  WET PREP, GENITAL      Result Value Range   Yeast Wet Prep HPF POC NONE SEEN  NONE SEEN   Trich, Wet Prep NONE SEEN  NONE SEEN   Clue Cells Wet Prep HPF POC FEW (*) NONE SEEN   WBC, Wet Prep HPF POC MODERATE (*) NONE SEEN  GLUCOSE, CAPILLARY      Result Value Range   Glucose-Capillary 449 (*) 70 - 99 mg/dL  PREGNANCY, URINE      Result Value Range   Preg Test, Ur NEGATIVE  NEGATIVE  CBC WITH DIFFERENTIAL      Result Value Range   WBC 11.3 (*) 4.0 - 10.5 K/uL   RBC 4.88  3.87 - 5.11 MIL/uL   Hemoglobin 15.9 (*) 12.0 - 15.0 g/dL   HCT 42.1  36.0 - 46.0 %   MCV 86.3  78.0 - 100.0 fL   MCH 32.6  26.0 - 34.0 pg   MCHC 37.8 (*) 30.0 - 36.0 g/dL   RDW 12.4  11.5 - 15.5 %   Platelets 230  150 - 400 K/uL   Neutrophils Relative 59  43 - 77 %   Neutro Abs 6.7  1.7 - 7.7 K/uL   Lymphocytes Relative 32  12 - 46 %   Lymphs Abs 3.6  0.7 - 4.0 K/uL   Monocytes Relative 6  3 - 12 %   Monocytes Absolute 0.6  0.1 - 1.0 K/uL   Eosinophils Relative 3  0 - 5 %   Eosinophils Absolute 0.3  0.0 - 0.7 K/uL   Basophils Relative 1  0 - 1 %   Basophils Absolute 0.1  0.0 - 0.1 K/uL  COMPREHENSIVE METABOLIC PANEL      Result Value Range   Sodium 129 (*) 135 - 145 mEq/L   Potassium 3.7  3.5 - 5.1 mEq/L   Chloride 92 (*) 96 - 112 mEq/L   CO2 23  19 - 32 mEq/L   Glucose, Bld 463 (*) 70 - 99 mg/dL   BUN 12  6 - 23 mg/dL   Creatinine, Ser 0.74  0.50 - 1.10 mg/dL   Calcium 9.8  8.4 - 10.5 mg/dL   Total Protein 8.1  6.0 - 8.3 g/dL   Albumin 3.8  3.5  - 5.2 g/dL   AST 10  0 - 37 U/L   ALT <5  0 - 35 U/L   Alkaline Phosphatase 80  39 - 117 U/L   Total Bilirubin 0.5  0.3 - 1.2 mg/dL   GFR calc non Af Amer >90  >90 mL/min   GFR calc Af Amer >90  >90 mL/min  LIPASE, BLOOD      Result Value Range   Lipase 56  11 - 59 U/L  POCT I-STAT, CHEM 8      Result Value Range   Sodium 133 (*) 135 - 145 mEq/L   Potassium 3.6  3.5 - 5.1 mEq/L   Chloride 99  96 - 112 mEq/L   BUN 13  6 - 23 mg/dL   Creatinine, Ser 0.70  0.50 - 1.10 mg/dL   Glucose, Bld 450 (*) 70 - 99 mg/dL   Calcium, Ion 1.20  1.12 - 1.23 mmol/L   TCO2 25  0 - 100 mmol/L   Hemoglobin 16.0 (*) 12.0 - 15.0 g/dL   HCT 47.0 (*) 36.0 - 46.0 %   Ct Abdomen Pelvis W Contrast  12/01/2012  *RADIOLOGY REPORT*  Clinical Data: Left-sided abdominal pain.  Nausea vomiting. History of cholecystectomy.  Diabetes.  Hyper lipidemia.  CT ABDOMEN AND PELVIS WITH CONTRAST  Technique:  Multidetector CT imaging of the abdomen and pelvis was performed following the standard protocol during bolus administration of intravenous contrast.  Contrast: 152mL OMNIPAQUE IOHEXOL 300 MG/ML  SOLN  Comparison: Pelvic ultrasound 10/15/2012.  No prior CT.  Findings: Lung bases:  Mild centrilobular emphysema at the lung bases.  Normal heart size without pericardial or pleural effusion.  Abdomen/pelvis:  Focal steatosis adjacent the falciform ligament. Normal spleen, stomach, pancreas. Cholecystectomy without biliary ductal dilatation.  Normal adrenal glands and kidneys.  Mildly age advanced aortic atherosclerosis. No retroperitoneal or retrocrural adenopathy.  Normal colon and terminal ileum.  Appendix is not visualized but there is no evidence of right lower quadrant inflammation.  Normal small bowel without abdominal ascites.  No pelvic adenopathy.  The bladder appears mildly thick-walled, including on image 81/series 2.  Possible mild surrounding edema. Focal hypoattenuation within the right side of the uterine fundus is  favored to be due to asymmetric endometrium, given normal appearance of the pelvis on 10/15/2012 ultrasound.  Low density within the left ovary / adnexa measures 1.6 cm on image 71/series 2 has a suggestion of peripheral enhancement. No significant free fluid.  Bones/Musculoskeletal:  No acute osseous abnormality.  IMPRESSION:  1.  Probable left ovarian corpus luteal cyst.  No other explanation for left-sided pain. 2.  Possible bladder wall thickening with surrounding mild edema. Correlate with symptoms to suggest cystitis.   Original Report Authenticated By: Abigail Miyamoto, M.D.     Lab tests showed bacterial vaginosis.  CT of abdomen/Pelvis showed a left ovarian cyst.  Rx with Percocet for pain and metronidazole for her bacterial vaginosis.  Advised that her blood sugar was high.     1. Left ovarian cyst   2. Bacterial vaginosis        Mylinda Latina III, MD 12/01/12 2020

## 2012-12-01 NOTE — ED Notes (Addendum)
Pt reports she vomited X 1 after eating. Notified Dr. Monia Pouch.

## 2012-12-03 LAB — GC/CHLAMYDIA PROBE AMP
CT Probe RNA: NEGATIVE
GC Probe RNA: NEGATIVE

## 2013-03-22 DIAGNOSIS — F17200 Nicotine dependence, unspecified, uncomplicated: Secondary | ICD-10-CM | POA: Insufficient documentation

## 2013-03-22 DIAGNOSIS — E119 Type 2 diabetes mellitus without complications: Secondary | ICD-10-CM | POA: Insufficient documentation

## 2013-03-22 DIAGNOSIS — Z79899 Other long term (current) drug therapy: Secondary | ICD-10-CM | POA: Insufficient documentation

## 2013-03-22 DIAGNOSIS — N949 Unspecified condition associated with female genital organs and menstrual cycle: Secondary | ICD-10-CM | POA: Insufficient documentation

## 2013-03-22 DIAGNOSIS — F319 Bipolar disorder, unspecified: Secondary | ICD-10-CM | POA: Insufficient documentation

## 2013-03-22 DIAGNOSIS — Z794 Long term (current) use of insulin: Secondary | ICD-10-CM | POA: Insufficient documentation

## 2013-03-22 DIAGNOSIS — R109 Unspecified abdominal pain: Secondary | ICD-10-CM | POA: Insufficient documentation

## 2013-03-22 DIAGNOSIS — N938 Other specified abnormal uterine and vaginal bleeding: Secondary | ICD-10-CM | POA: Insufficient documentation

## 2013-03-22 DIAGNOSIS — E785 Hyperlipidemia, unspecified: Secondary | ICD-10-CM | POA: Insufficient documentation

## 2013-03-22 DIAGNOSIS — I10 Essential (primary) hypertension: Secondary | ICD-10-CM | POA: Insufficient documentation

## 2013-03-22 DIAGNOSIS — Z8742 Personal history of other diseases of the female genital tract: Secondary | ICD-10-CM | POA: Insufficient documentation

## 2013-03-22 NOTE — ED Notes (Signed)
Presents with lower abdominal pain and vaginal bleeding that began at 11 pm. Pt states, "I had a large glob of blood with white stuff on it came out of my vagina and I am  Having severe lower abdominal pain" Last menstrual period 3 moths ago. Pt is unsure if she is pregnant or not. Denies nausea.

## 2013-03-23 ENCOUNTER — Emergency Department (HOSPITAL_COMMUNITY)
Admission: EM | Admit: 2013-03-23 | Discharge: 2013-03-23 | Discharge status: Against Medical Advice | Payer: Medicaid Other | Attending: Emergency Medicine | Admitting: Emergency Medicine

## 2013-06-21 ENCOUNTER — Encounter (HOSPITAL_COMMUNITY): Payer: Self-pay | Admitting: Adult Health

## 2013-06-21 ENCOUNTER — Emergency Department (HOSPITAL_COMMUNITY)
Admission: EM | Admit: 2013-06-21 | Discharge: 2013-06-21 | Disposition: A | Discharge status: Home / Self Care | Payer: Medicaid Other | Attending: Emergency Medicine | Admitting: Emergency Medicine

## 2013-06-21 DIAGNOSIS — L0291 Cutaneous abscess, unspecified: Secondary | ICD-10-CM

## 2013-06-21 DIAGNOSIS — E119 Type 2 diabetes mellitus without complications: Secondary | ICD-10-CM | POA: Insufficient documentation

## 2013-06-21 DIAGNOSIS — N390 Urinary tract infection, site not specified: Secondary | ICD-10-CM

## 2013-06-21 DIAGNOSIS — N898 Other specified noninflammatory disorders of vagina: Secondary | ICD-10-CM | POA: Insufficient documentation

## 2013-06-21 DIAGNOSIS — R109 Unspecified abdominal pain: Secondary | ICD-10-CM

## 2013-06-21 DIAGNOSIS — Z862 Personal history of diseases of the blood and blood-forming organs and certain disorders involving the immune mechanism: Secondary | ICD-10-CM | POA: Insufficient documentation

## 2013-06-21 DIAGNOSIS — Z794 Long term (current) use of insulin: Secondary | ICD-10-CM | POA: Insufficient documentation

## 2013-06-21 DIAGNOSIS — Z8639 Personal history of other endocrine, nutritional and metabolic disease: Secondary | ICD-10-CM | POA: Insufficient documentation

## 2013-06-21 DIAGNOSIS — Z8659 Personal history of other mental and behavioral disorders: Secondary | ICD-10-CM | POA: Insufficient documentation

## 2013-06-21 DIAGNOSIS — G8929 Other chronic pain: Secondary | ICD-10-CM

## 2013-06-21 DIAGNOSIS — F17200 Nicotine dependence, unspecified, uncomplicated: Secondary | ICD-10-CM | POA: Insufficient documentation

## 2013-06-21 DIAGNOSIS — Z3202 Encounter for pregnancy test, result negative: Secondary | ICD-10-CM | POA: Insufficient documentation

## 2013-06-21 DIAGNOSIS — I10 Essential (primary) hypertension: Secondary | ICD-10-CM | POA: Insufficient documentation

## 2013-06-21 DIAGNOSIS — R112 Nausea with vomiting, unspecified: Secondary | ICD-10-CM | POA: Insufficient documentation

## 2013-06-21 DIAGNOSIS — L02219 Cutaneous abscess of trunk, unspecified: Secondary | ICD-10-CM | POA: Insufficient documentation

## 2013-06-21 LAB — COMPREHENSIVE METABOLIC PANEL
ALT: <5 U/L (ref 0–35)
BUN: 9 mg/dL (ref 6–23)
CO2: 26 mEq/L (ref 19–32)
Calcium: 9.3 mg/dL (ref 8.4–10.5)
GFR calc Af Amer: >90 mL/min (ref >90)
GFR calc non Af Amer: >90 mL/min (ref >90)
Glucose, Bld: 309 mg/dL — ABNORMAL HIGH (ref 70–99)
Sodium: 133 mEq/L — ABNORMAL LOW (ref 135–145)

## 2013-06-21 LAB — CBC WITH DIFFERENTIAL/PLATELET
Eosinophils Absolute: 0.3 10*3/uL (ref 0.0–0.7)
Eosinophils Relative: 3 % (ref 0–5)
HCT: 40.3 % (ref 36.0–46.0)
Hemoglobin: 14.7 g/dL (ref 12.0–15.0)
Lymphocytes Relative: 28 % (ref 12–46)
Lymphs Abs: 3 10*3/uL (ref 0.7–4.0)
MCH: 31.5 pg (ref 26.0–34.0)
MCV: 86.3 fL (ref 78.0–100.0)
Monocytes Absolute: 0.5 10*3/uL (ref 0.1–1.0)
Monocytes Relative: 5 % (ref 3–12)
Platelets: 191 10*3/uL (ref 150–400)
RBC: 4.67 MIL/uL (ref 3.87–5.11)
WBC: 10.7 10*3/uL — ABNORMAL HIGH (ref 4.0–10.5)

## 2013-06-21 LAB — URINALYSIS, ROUTINE W REFLEX MICROSCOPIC
Bilirubin Urine: NEGATIVE
Ketones, ur: NEGATIVE mg/dL
Nitrite: POSITIVE — AB
Specific Gravity, Urine: 1.035 — ABNORMAL HIGH (ref 1.005–1.030)
Urobilinogen, UA: 1 mg/dL (ref 0.0–1.0)

## 2013-06-21 LAB — URINE MICROSCOPIC-ADD ON

## 2013-06-21 MED ORDER — CEPHALEXIN 250 MG PO CAPS: 250.0000 mg | ORAL_CAPSULE | Freq: Four times a day (QID) | ORAL | Status: DC

## 2013-06-21 MED ORDER — OXYCODONE-ACETAMINOPHEN 5-325 MG PO TABS
2.0000 | ORAL_TABLET | Freq: Once | ORAL | Status: AC
  Administered 2013-06-21: 2 via ORAL
  Filled 2013-06-21: qty 2

## 2013-06-21 MED ORDER — SULFAMETHOXAZOLE-TRIMETHOPRIM 800-160 MG PO TABS: 1.0000 | ORAL_TABLET | Freq: Two times a day (BID) | ORAL | Status: DC

## 2013-06-21 NOTE — ED Provider Notes (Signed)
CSN: [id number]     Arrival date & time 06/21/13  1634 History   First MD Initiated Contact with Patient 06/21/13 1742     Chief Complaint  Patient presents with  . Abscess  . Hyperglycemia  . Abdominal Pain   (Consider location/radiation/quality/duration/timing/severity/associated sxs/prior Treatment) Patient is a 32 y.o. female presenting with abdominal pain. The history is provided by the patient. No language interpreter was used.  Abdominal Pain Pain location:  LLQ Pain quality: sharp   Pain radiates to:  L flank Pain severity:  Moderate Onset quality:  Gradual Duration:  12 weeks Timing:  Constant Progression:  Unchanged Chronicity:  Chronic Context: not recent illness, not sick contacts and not trauma   Relieved by:  Nothing Worsened by:  Nothing tried Ineffective treatments:  None tried Associated symptoms: nausea, vaginal bleeding and vomiting   Associated symptoms: no chest pain, no cough, no diarrhea, no dysuria, no fever, no hematuria, no shortness of breath and no sore throat     Past Medical History  Diagnosis Date  . Hypertension   . Diabetes mellitus   . Hyperlipidemia   . Pregnancy complication     HELP Syndrome  . Complete miscarriage   . Bipolar affective   . Depression    Past Surgical History  Procedure Laterality Date  . Cesarean section    . Cholecystectomy    . Tubal ligation     History reviewed. No pertinent family history. History  Substance Use Topics  . Smoking status: Current Every Day Smoker -- 0.50 packs/day    Types: Cigarettes  . Smokeless tobacco: Never Used  . Alcohol Use: No   OB History   Grav Para Term Preterm Abortions TAB SAB Ect Mult Living                 Review of Systems  Constitutional: Negative for fever.  HENT: Negative for congestion, sore throat and rhinorrhea.   Respiratory: Negative for cough and shortness of breath.   Cardiovascular: Negative for chest pain.  Gastrointestinal: Positive for nausea,  vomiting and abdominal pain. Negative for diarrhea.  Genitourinary: Positive for vaginal bleeding. Negative for dysuria and hematuria.  Skin: Negative for rash.  Neurological: Negative for syncope, light-headedness and headaches.  All other systems reviewed and are negative.    Allergies  Vicodin  Home Medications   Current Outpatient Rx  Name  Route  Sig  Dispense  Refill  . insulin glargine (LANTUS) 100 UNIT/ML injection   Subcutaneous   Inject 26 Units into the skin at bedtime.         Marland Kitchen EXPIRED: metFORMIN (GLUCOPHAGE) 1000 MG tablet   Oral   Take 1,000 mg by mouth 2 (two) times daily.          Marland Kitchen oxyCODONE-acetaminophen (PERCOCET/ROXICET) 5-325 MG per tablet   Oral   Take 1 tablet by mouth every 4 (four) hours as needed for pain.   20 tablet   0    BP 123/94  Pulse 107  Temp(Src) 98.5 F (36.9 C) (Oral)  Resp 16  Wt 159 lb 4 oz (72.235 kg)  BMI 23.51 kg/m2  SpO2 99% Physical Exam  Nursing note and vitals reviewed. Constitutional: She is oriented to person, place, and time. She appears well-developed and well-nourished.  HENT:  Head: Normocephalic and atraumatic.  Right Ear: External ear normal.  Left Ear: External ear normal.  Eyes: EOM are normal.  Neck: Normal range of motion. Neck supple.  Cardiovascular: Normal rate, regular  rhythm, normal heart sounds and intact distal pulses.  Exam reveals no gallop and no friction rub.   No murmur heard. Pulmonary/Chest: Effort normal and breath sounds normal. No respiratory distress. She has no wheezes. She has no rales. She exhibits no tenderness.  Abdominal: Soft. Bowel sounds are normal. She exhibits no distension. There is no tenderness. There is no rebound.  Musculoskeletal: Normal range of motion. She exhibits no edema and no tenderness.  Lymphadenopathy:    She has no cervical adenopathy.  Neurological: She is alert and oriented to person, place, and time.  Skin: Skin is warm. No rash noted.  Psychiatric:  She has a normal mood and affect. Her behavior is normal.    ED Course  INCISION AND DRAINAGE Date/Time: 06/21/2013 6:45 PM Performed by: Joanell Rising Authorized by: Joanell Rising Consent: Verbal consent obtained. Risks and benefits: risks, benefits and alternatives were discussed Consent given by: patient Patient identity confirmed: verbally with patient, arm band and hospital-assigned identification number Type: abscess Body area: trunk Location details: abdomen Anesthesia: local infiltration Local anesthetic: lidocaine 1% without epinephrine Anesthetic total: 4 ml Patient sedated: no Scalpel size: 11 Needle gauge: 18 Incision type: single straight Complexity: simple Drainage: purulent Drainage amount: moderate Wound treatment: wound left open Patient tolerance: Patient tolerated the procedure well with no immediate complications.   (including critical care time) Labs Review Labs Reviewed  GLUCOSE, CAPILLARY - Abnormal; Notable for the following:    Glucose-Capillary 327 (*)    All other components within normal limits  CBC WITH DIFFERENTIAL  COMPREHENSIVE METABOLIC PANEL  URINALYSIS, ROUTINE W REFLEX MICROSCOPIC   Imaging Review No results found.  MDM  No diagnosis found. 5:45 PM Pt is a 32 y.o. female with pertinent PMHX of HTN,DM,HLD,Bipolar, Depression who presents to the ED with abdominal pain, abscess. Abdominal pain for 3 months. Described as sharp in nature with radiation to kidneys. Pain located in LUQ, constant with no relieving factors and worse with nothing. Pt endorses nausea. Vomiting 3 times in the past 24 hours nonbilious, nonbloody, Diarrhea 0 times with no evidence of blood. No fevers. Last BM today. Not on medications that increase constipation. Pt stated she has been non compliant on her insulin and metformin for 1 year. Pt stated her doctor does not help her with her pain. Normal CT abdomen pelvis from 11/2012  On exam: AFVSS, no peritoneal  signs. Diffusely TTP Abscess 4x3 suprapubic with associated cellulitis. I&D as above. Plan for: CBC, CMP, UA, UPT. Will send wound culture. Plan on pelvic exam. Pt also endorses vaginal bleeding weekly soaking through several pads of bright red blood. Pt has not seen a physician about this. Will hold off on CT, pt's pain has not changed in character since previous visit in February 2014.  Review of labs: UA concerning for UTI. UPT negative. CBC showed leukocytosis. H&H 14.7/40.3. CMP showed hyponatremia. LFT no elevation, POCT glucose 327. No gap on CMP. Leukocytosis likely relaed to UTI, no CVA tenderness afebrile, low clinical suspicion for pyelonephritis. Pt has no history of kidney stones.  Pt refused Pelvic exam. Discussed at length inability to diagnose possible pelvic pathology and bleeding. Pt verbalized understanding and still wishes to leave. [facility] have [facility]. Will send pt home with Keflex for UTI and Bactrim for abscess. Pt has history of MRSA  7:50 PM: I have discussed the diagnosis/risks/treatment options with the patient and believe the pt to be eligible for discharge home to follow-up  with [facility], [facility] hospital. We also discussed returning to the ED immediately if new or worsening sx occur. We discussed the sx which are most concerning (e.g., worsening symptoms) that necessitate immediate return. Any new prescriptions provided to the patient are listed below.   Discharge Medication List as of 06/21/2013  7:56 PM    START taking these medications   Details  cephALEXin (KEFLEX) 250 MG capsule Take 1 capsule (250 mg total) by mouth 4 (four) times daily., Starting 06/21/2013, Until Discontinued, Print    sulfamethoxazole-trimethoprim (BACTRIM DS,SEPTRA DS) 800-160 MG per tablet Take 1 tablet by mouth 2 (two) times daily. One po bid x 7 days, Starting 06/21/2013, Until Discontinued, Print       The patient appears reasonably screened and/or  stabilized for discharge and I doubt any other medical condition or other [facility] requiring further screening, evaluation or treatment in the ED at this time prior to discharge . Pt in agreement with discharge plan. Return precautions given. Pt discharged VSS   Labs reviewed by myself and considered in medical decision making if ordered.  Pt was discussed with my attending, Dr. Lujean Amel, MD 06/22/13 [phone]

## 2013-06-21 NOTE — ED Notes (Addendum)
Presents with induration to pubic area that began yesterday and has increased in size. Redness and bloody drainage noted. Pt reports being out of diabetic medications for 3 months. CBG 327. Alert and oriented. Afebrile.  Pt alos c/o abdominal distention and abdominal pain for one month denies nausea. Pain radiates around lower abdomen.

## 2013-06-21 NOTE — ED Notes (Signed)
Pt refuses pelvic exam

## 2013-06-24 LAB — CULTURE, ROUTINE-ABSCESS

## 2013-06-24 LAB — URINE CULTURE: Colony Count: >=100000

## 2013-06-25 NOTE — ED Notes (Signed)
+   Urine Treated with Cephalexin-chart appended per protocol MD.

## 2013-06-29 NOTE — ED Provider Notes (Signed)
I saw and evaluated the patient, reviewed the resident's note and I agree with the findings and plan.  Patient with skin abscess requiring I&D, performed by Dr. Jacelyn Grip. Outpatient treatment with Bactrim.  Orpah Greek, MD 06/29/13 [phone]

## 2013-07-10 ENCOUNTER — Encounter (HOSPITAL_COMMUNITY): Payer: Self-pay | Admitting: Adult Health

## 2013-07-10 ENCOUNTER — Emergency Department (HOSPITAL_COMMUNITY)
Admission: EM | Admit: 2013-07-10 | Discharge: 2013-07-11 | Disposition: A | Discharge status: Home / Self Care | Payer: Medicaid Other | Attending: Emergency Medicine | Admitting: Emergency Medicine

## 2013-07-10 DIAGNOSIS — K1370 Unspecified lesions of oral mucosa: Secondary | ICD-10-CM | POA: Insufficient documentation

## 2013-07-10 DIAGNOSIS — K140 Glossitis: Secondary | ICD-10-CM | POA: Insufficient documentation

## 2013-07-10 DIAGNOSIS — I10 Essential (primary) hypertension: Secondary | ICD-10-CM | POA: Insufficient documentation

## 2013-07-10 DIAGNOSIS — F319 Bipolar disorder, unspecified: Secondary | ICD-10-CM | POA: Insufficient documentation

## 2013-07-10 DIAGNOSIS — E785 Hyperlipidemia, unspecified: Secondary | ICD-10-CM | POA: Insufficient documentation

## 2013-07-10 DIAGNOSIS — R000 Tachycardia, unspecified: Secondary | ICD-10-CM | POA: Insufficient documentation

## 2013-07-10 DIAGNOSIS — Z79899 Other long term (current) drug therapy: Secondary | ICD-10-CM | POA: Insufficient documentation

## 2013-07-10 DIAGNOSIS — R1310 Dysphagia, unspecified: Secondary | ICD-10-CM | POA: Insufficient documentation

## 2013-07-10 DIAGNOSIS — Z8742 Personal history of other diseases of the female genital tract: Secondary | ICD-10-CM | POA: Insufficient documentation

## 2013-07-10 DIAGNOSIS — F17200 Nicotine dependence, unspecified, uncomplicated: Secondary | ICD-10-CM | POA: Insufficient documentation

## 2013-07-10 DIAGNOSIS — E119 Type 2 diabetes mellitus without complications: Secondary | ICD-10-CM | POA: Insufficient documentation

## 2013-07-10 MED ORDER — KETOROLAC TROMETHAMINE 30 MG/ML IJ SOLN
30.0000 mg | Freq: Once | INTRAMUSCULAR | Status: AC
  Administered 2013-07-10: 30 mg via INTRAVENOUS
  Filled 2013-07-10: qty 1

## 2013-07-10 MED ORDER — ALUM & MAG HYDROXIDE-SIMETH 200-200-20 MG/5ML PO SUSP: 30.0000 mL | Freq: Once | ORAL | Status: DC

## 2013-07-10 MED ORDER — ALUM & MAG HYDROXIDE-SIMETH 200-200-20 MG/5ML PO SUSP
30.0000 mL | Freq: Once | ORAL | Status: AC
  Administered 2013-07-10: 30 mL via ORAL
  Filled 2013-07-10: qty 30

## 2013-07-10 MED ORDER — DIPHENHYDRAMINE HCL 12.5 MG/5ML PO ELIX: 25.0000 mg | ORAL_SOLUTION | Freq: Once | ORAL | Status: DC

## 2013-07-10 MED ORDER — SODIUM CHLORIDE 0.9 % IV BOLUS (SEPSIS)
2000.0000 mL | Freq: Once | INTRAVENOUS | Status: AC
  Administered 2013-07-10 (×2): 1000 mL via INTRAVENOUS

## 2013-07-10 MED ORDER — DIPHENHYDRAMINE HCL 12.5 MG/5ML PO ELIX
25.0000 mg | ORAL_SOLUTION | Freq: Once | ORAL | Status: AC
  Administered 2013-07-10: 25 mg via ORAL
  Filled 2013-07-10: qty 10

## 2013-07-10 MED ORDER — MAGIC MOUTHWASH
5.0000 mL | Freq: Once | ORAL | Status: AC
  Administered 2013-07-10: 5 mL via ORAL
  Filled 2013-07-10: qty 5

## 2013-07-10 NOTE — ED Notes (Signed)
Presents with "my mouth burns everytime I eat or swallow and it goes down my throat, everything in my mouth just burns" mouth is red, throat has white exudate.

## 2013-07-10 NOTE — ED Provider Notes (Signed)
CSN: [id number]     Arrival date & time 07/10/13  2038 History   First MD Initiated Contact with Patient 07/10/13 2157     Chief Complaint  Patient presents with  . Sore Throat   (Consider location/radiation/quality/duration/timing/severity/associated sxs/prior Treatment) HPI Comments: Patient states, that she has burning in her mouth, which is keeping her from swallowing, eating or drinking.  She reports, that she has not had anything to eat, drink in the past 24 hours, and she has not urinated in the same amount of time. Denies eating anything abnormal, burning.  Her mouth with any kind of fluids or foods.  Denies any generalized myalgias, fevers  Patient is a 32 y.o. female presenting with pharyngitis. The history is provided by the patient.  Sore Throat This is a new problem. The current episode started yesterday. The problem occurs constantly. The problem has been unchanged. Pertinent negatives include no chills, coughing, fever, myalgias, nausea, neck pain, rash or sore throat. The symptoms are aggravated by swallowing.    Past Medical History  Diagnosis Date  . Hypertension   . Diabetes mellitus   . Hyperlipidemia   . Pregnancy complication     HELP Syndrome  . Complete miscarriage   . Bipolar affective   . Depression    Past Surgical History  Procedure Laterality Date  . Cesarean section    . Cholecystectomy    . Tubal ligation     History reviewed. No pertinent family history. History  Substance Use Topics  . Smoking status: Current Every Day Smoker -- 0.50 packs/day    Types: Cigarettes  . Smokeless tobacco: Never Used  . Alcohol Use: No   OB History   Grav Para Term Preterm Abortions TAB SAB Ect Mult Living                 Review of Systems  Constitutional: Negative for fever and chills.  HENT: Positive for mouth sores and trouble swallowing. Negative for sore throat, rhinorrhea and neck pain.   Respiratory: Negative for cough and shortness of breath.    Gastrointestinal: Negative for nausea.  Musculoskeletal: Negative for myalgias.  Skin: Negative for rash and wound.  All other systems reviewed and are negative.    Allergies  Vicodin  Home Medications   Current Outpatient Rx  Name  Route  Sig  Dispense  Refill  . ALPRAZolam (XANAX) 1 MG tablet   Oral   Take 1 mg by mouth 3 (three) times daily.         . QUEtiapine (SEROQUEL) 400 MG tablet   Oral   Take 400 mg by mouth at bedtime.         Marland Kitchen alum & mag hydroxide-simeth (MAALOX/MYLANTA) 200-200-20 MG/5ML suspension   Oral   Take 30 mLs by mouth once.   355 mL   0     Mix with the Benadryl liquid 25 milligrams and tak ...   . diphenhydrAMINE (BENADRYL) 12.5 MG/5ML elixir   Oral   Take 10 mLs (25 mg total) by mouth once.   120 mL   0     Mix with the Mylanta    BP 110/84  Pulse 104  Temp(Src) 98.2 F (36.8 C) (Oral)  Resp 18  SpO2 96% Physical Exam  Nursing note and vitals reviewed. Constitutional: She is oriented to person, place, and time. She appears well-developed and well-nourished. No distress.  HENT:  Head: Normocephalic and atraumatic.  Mouth/Throat: Oropharynx is clear and moist.  Trabucco cavity  is clear of any, rash or lesions.  Does not appear to be particularly red or swollen.  The posterior pharynx is negative for exudate.  Uvula is normal.  Size.  Midline  Neck: Normal range of motion. No tracheal deviation present.  Cardiovascular: Regular rhythm.  Tachycardia present.   Pulmonary/Chest: Effort normal.  Musculoskeletal: Normal range of motion.  Lymphadenopathy:    She has no cervical adenopathy.  Neurological: She is alert and oriented to person, place, and time.  Skin: Skin is warm. No rash noted. No erythema.    ED Course  Procedures (including critical care time) Labs Review Labs Reviewed  RAPID STREP SCREEN  CULTURE, GROUP A STREP   Imaging Review No results found.  MDM   1. Glossitis     Patient.  2 L fluid, until she  is urinating freely.  I do not see any indication of trauma to the mouth.  Her strep test is been reviewed and is negative.  I will try Magic mouthwash for symptom control, as well as Toradol.  For pain relief   Garald Balding, NP 07/14/13 2003

## 2013-07-10 NOTE — ED Notes (Signed)
Pt st's she has not been able to eat or drink anything for 2 days due to mouth burning.

## 2013-07-10 NOTE — ED Notes (Signed)
Pt given Maalox and Benadryl to swish in mouth.

## 2013-07-11 NOTE — ED Notes (Signed)
Pt ambulatory to bathroom and voided without any difficulty.

## 2013-07-14 NOTE — ED Provider Notes (Signed)
History/physical exam/procedure(s) were performed by non-physician practitioner and as supervising physician I was immediately available for consultation/collaboration. I have reviewed all notes and am in agreement with care and plan.   Shaune Pollack, MD 07/14/13 [phone]

## 2013-12-11 ENCOUNTER — Encounter (HOSPITAL_COMMUNITY): Payer: Self-pay | Admitting: Emergency Medicine

## 2013-12-11 ENCOUNTER — Emergency Department (HOSPITAL_COMMUNITY)
Admission: EM | Admit: 2013-12-11 | Discharge: 2013-12-11 | Discharge status: Against Medical Advice | Payer: Medicaid Other | Attending: Emergency Medicine | Admitting: Emergency Medicine

## 2013-12-11 DIAGNOSIS — F17200 Nicotine dependence, unspecified, uncomplicated: Secondary | ICD-10-CM | POA: Insufficient documentation

## 2013-12-11 DIAGNOSIS — K089 Disorder of teeth and supporting structures, unspecified: Secondary | ICD-10-CM | POA: Insufficient documentation

## 2013-12-11 DIAGNOSIS — K0381 Cracked tooth: Secondary | ICD-10-CM | POA: Insufficient documentation

## 2013-12-11 DIAGNOSIS — E119 Type 2 diabetes mellitus without complications: Secondary | ICD-10-CM | POA: Insufficient documentation

## 2013-12-11 DIAGNOSIS — I10 Essential (primary) hypertension: Secondary | ICD-10-CM | POA: Insufficient documentation

## 2013-12-11 NOTE — ED Notes (Signed)
Pt noted to have left ED with friend, did not answer call in lobby.

## 2013-12-11 NOTE — ED Notes (Signed)
Patient did not answer x 2 

## 2013-12-11 NOTE — ED Notes (Addendum)
Pt reports one of her bottom R teeth broke this morning and is cutting into her gum and tongue, states she has taken Advil without relief of pain. Pt a&o x4, ambulatory to triage

## 2013-12-23 ENCOUNTER — Emergency Department (HOSPITAL_COMMUNITY)
Admission: EM | Admit: 2013-12-23 | Discharge: 2013-12-23 | Discharge status: Against Medical Advice | Payer: Medicaid Other | Attending: Emergency Medicine | Admitting: Emergency Medicine

## 2013-12-23 ENCOUNTER — Encounter (HOSPITAL_COMMUNITY): Payer: Self-pay | Admitting: Emergency Medicine

## 2013-12-23 DIAGNOSIS — K089 Disorder of teeth and supporting structures, unspecified: Secondary | ICD-10-CM | POA: Insufficient documentation

## 2013-12-23 DIAGNOSIS — R51 Headache: Secondary | ICD-10-CM | POA: Insufficient documentation

## 2013-12-23 DIAGNOSIS — K0381 Cracked tooth: Secondary | ICD-10-CM | POA: Insufficient documentation

## 2013-12-23 DIAGNOSIS — R109 Unspecified abdominal pain: Secondary | ICD-10-CM | POA: Insufficient documentation

## 2013-12-23 LAB — CBG MONITORING, ED: GLUCOSE-CAPILLARY: 215 mg/dL — AB (ref 70–99)

## 2013-12-23 NOTE — ED Notes (Addendum)
Multiple complaints.Pt reports right upper dental pain and broken tooth, causing headache, airway intact. Having right side abd pain and feels like cbg is high.

## 2013-12-23 NOTE — ED Notes (Signed)
Unable to locate patient.

## 2013-12-23 NOTE — ED Notes (Signed)
Unable to locate pt  

## 2013-12-23 NOTE — ED Notes (Signed)
No answer in lobby or outside 

## 2014-01-29 ENCOUNTER — Encounter (HOSPITAL_COMMUNITY): Payer: Self-pay | Admitting: Emergency Medicine

## 2014-01-29 ENCOUNTER — Emergency Department (HOSPITAL_COMMUNITY)
Admission: EM | Admit: 2014-01-29 | Discharge: 2014-01-30 | Discharge status: Against Medical Advice | Payer: Medicaid Other | Attending: Emergency Medicine | Admitting: Emergency Medicine

## 2014-01-29 DIAGNOSIS — F17200 Nicotine dependence, unspecified, uncomplicated: Secondary | ICD-10-CM | POA: Insufficient documentation

## 2014-01-29 DIAGNOSIS — N898 Other specified noninflammatory disorders of vagina: Secondary | ICD-10-CM | POA: Insufficient documentation

## 2014-01-29 DIAGNOSIS — I10 Essential (primary) hypertension: Secondary | ICD-10-CM | POA: Insufficient documentation

## 2014-01-29 DIAGNOSIS — R51 Headache: Secondary | ICD-10-CM | POA: Insufficient documentation

## 2014-01-29 DIAGNOSIS — R040 Epistaxis: Secondary | ICD-10-CM | POA: Insufficient documentation

## 2014-01-29 DIAGNOSIS — E119 Type 2 diabetes mellitus without complications: Secondary | ICD-10-CM | POA: Insufficient documentation

## 2014-01-29 DIAGNOSIS — R112 Nausea with vomiting, unspecified: Secondary | ICD-10-CM | POA: Insufficient documentation

## 2014-01-29 LAB — COMPREHENSIVE METABOLIC PANEL
ALBUMIN: 3.1 g/dL — AB (ref 3.5–5.2)
ALT: <5 U/L (ref 0–35)
AST: 10 U/L (ref 0–37)
Alkaline Phosphatase: 62 U/L (ref 39–117)
BILIRUBIN TOTAL: 0.2 mg/dL — AB (ref 0.3–1.2)
BUN: 13 mg/dL (ref 6–23)
CO2: 27 mEq/L (ref 19–32)
Calcium: 9.1 mg/dL (ref 8.4–10.5)
Chloride: 101 mEq/L (ref 96–112)
Creatinine, Ser: 0.87 mg/dL (ref 0.50–1.10)
GFR calc Af Amer: >90 mL/min (ref >90)
GFR calc non Af Amer: 86 mL/min — ABNORMAL LOW (ref >90)
Glucose, Bld: 289 mg/dL — ABNORMAL HIGH (ref 70–99)
Potassium: 4.1 mEq/L (ref 3.7–5.3)
Sodium: 138 mEq/L (ref 137–147)
TOTAL PROTEIN: 6.6 g/dL (ref 6.0–8.3)

## 2014-01-29 LAB — CBC WITH DIFFERENTIAL/PLATELET
BASOS PCT: 0 % (ref 0–1)
Basophils Absolute: 0 10*3/uL (ref 0.0–0.1)
EOS ABS: 0.4 10*3/uL (ref 0.0–0.7)
Eosinophils Relative: 3 % (ref 0–5)
HEMATOCRIT: 40 % (ref 36.0–46.0)
Hemoglobin: 14.4 g/dL (ref 12.0–15.0)
Lymphocytes Relative: 44 % (ref 12–46)
Lymphs Abs: 5 10*3/uL — ABNORMAL HIGH (ref 0.7–4.0)
MCH: 32.2 pg (ref 26.0–34.0)
MCHC: 36 g/dL (ref 30.0–36.0)
MCV: 89.5 fL (ref 78.0–100.0)
MONO ABS: 0.6 10*3/uL (ref 0.1–1.0)
MONOS PCT: 6 % (ref 3–12)
Neutro Abs: 5.3 10*3/uL (ref 1.7–7.7)
Neutrophils Relative %: 47 % (ref 43–77)
Platelets: 202 10*3/uL (ref 150–400)
RBC: 4.47 MIL/uL (ref 3.87–5.11)
RDW: 12.4 % (ref 11.5–15.5)
WBC: 11.4 10*3/uL — ABNORMAL HIGH (ref 4.0–10.5)

## 2014-01-29 NOTE — ED Notes (Signed)
Pt at desk stating she is leaving due to wait, encouraged to stay

## 2014-01-29 NOTE — ED Notes (Signed)
Pt. reports vaginal bleeding with nausea , vomitting , epistaxis and headache onset 2 days ago.

## 2014-05-29 ENCOUNTER — Emergency Department (HOSPITAL_COMMUNITY)
Admission: EM | Admit: 2014-05-29 | Discharge: 2014-05-30 | Discharge status: Against Medical Advice | Payer: Medicaid Other | Attending: Emergency Medicine | Admitting: Emergency Medicine

## 2014-05-29 ENCOUNTER — Encounter (HOSPITAL_COMMUNITY): Payer: Self-pay | Admitting: Emergency Medicine

## 2014-05-29 DIAGNOSIS — I10 Essential (primary) hypertension: Secondary | ICD-10-CM | POA: Insufficient documentation

## 2014-05-29 DIAGNOSIS — R112 Nausea with vomiting, unspecified: Secondary | ICD-10-CM | POA: Diagnosis not present

## 2014-05-29 DIAGNOSIS — R509 Fever, unspecified: Secondary | ICD-10-CM | POA: Diagnosis present

## 2014-05-29 DIAGNOSIS — F17200 Nicotine dependence, unspecified, uncomplicated: Secondary | ICD-10-CM | POA: Insufficient documentation

## 2014-05-29 DIAGNOSIS — E119 Type 2 diabetes mellitus without complications: Secondary | ICD-10-CM | POA: Diagnosis not present

## 2014-05-29 DIAGNOSIS — R52 Pain, unspecified: Secondary | ICD-10-CM | POA: Insufficient documentation

## 2014-05-29 LAB — CBC WITH DIFFERENTIAL/PLATELET
Basophils Absolute: 0 10*3/uL (ref 0.0–0.1)
Basophils Relative: 0 % (ref 0–1)
EOS PCT: 1 % (ref 0–5)
Eosinophils Absolute: 0.1 10*3/uL (ref 0.0–0.7)
HEMATOCRIT: 42.6 % (ref 36.0–46.0)
Hemoglobin: 15.5 g/dL — ABNORMAL HIGH (ref 12.0–15.0)
LYMPHS ABS: 2.2 10*3/uL (ref 0.7–4.0)
LYMPHS PCT: 16 % (ref 12–46)
MCH: 32.2 pg (ref 26.0–34.0)
MCHC: 36.4 g/dL — ABNORMAL HIGH (ref 30.0–36.0)
MCV: 88.6 fL (ref 78.0–100.0)
Monocytes Absolute: 1.4 10*3/uL — ABNORMAL HIGH (ref 0.1–1.0)
Monocytes Relative: 11 % (ref 3–12)
NEUTROS ABS: 9.8 10*3/uL — AB (ref 1.7–7.7)
Neutrophils Relative %: 72 % (ref 43–77)
Platelets: 171 10*3/uL (ref 150–400)
RBC: 4.81 MIL/uL (ref 3.87–5.11)
RDW: 11.8 % (ref 11.5–15.5)
WBC: 13.5 10*3/uL — AB (ref 4.0–10.5)

## 2014-05-29 LAB — COMPREHENSIVE METABOLIC PANEL
ALK PHOS: 83 U/L (ref 39–117)
ALT: 10 U/L (ref 0–35)
AST: 23 U/L (ref 0–37)
Albumin: 3.5 g/dL (ref 3.5–5.2)
Anion gap: 14 (ref 5–15)
BUN: 13 mg/dL (ref 6–23)
CALCIUM: 9.1 mg/dL (ref 8.4–10.5)
CHLORIDE: 93 meq/L — AB (ref 96–112)
CO2: 23 meq/L (ref 19–32)
Creatinine, Ser: 0.86 mg/dL (ref 0.50–1.10)
GFR calc Af Amer: >90 mL/min (ref >90)
GFR, EST NON AFRICAN AMERICAN: 87 mL/min — AB (ref >90)
GLUCOSE: 306 mg/dL — AB (ref 70–99)
POTASSIUM: 3.7 meq/L (ref 3.7–5.3)
SODIUM: 130 meq/L — AB (ref 137–147)
Total Bilirubin: 0.8 mg/dL (ref 0.3–1.2)
Total Protein: 7.7 g/dL (ref 6.0–8.3)

## 2014-05-29 MED ORDER — IBUPROFEN 200 MG PO TABS
600.0000 mg | ORAL_TABLET | Freq: Once | ORAL | Status: AC
  Administered 2014-05-29: 600 mg via ORAL
  Filled 2014-05-29: qty 3

## 2014-05-29 NOTE — ED Notes (Signed)
Pt presents with nausea, vomiting, fever x2 days. Pt states "Ive been falling in and out of sleep." Pt 100% RA, ambulatory into triage

## 2014-05-30 NOTE — ED Notes (Signed)
11pm Nurse first unable to locate pt.  Pt's pager is in pager holder.  No response in waiting area.

## 2014-05-30 NOTE — ED Notes (Signed)
Unable to locate pt  

## 2014-06-30 ENCOUNTER — Emergency Department (HOSPITAL_COMMUNITY): Payer: Medicaid Other

## 2014-06-30 ENCOUNTER — Encounter (HOSPITAL_COMMUNITY): Payer: Self-pay | Admitting: Emergency Medicine

## 2014-06-30 ENCOUNTER — Emergency Department (HOSPITAL_COMMUNITY)
Admission: EM | Admit: 2014-06-30 | Discharge: 2014-07-01 | Disposition: A | Discharge status: Home / Self Care | Payer: Medicaid Other | Attending: Emergency Medicine | Admitting: Emergency Medicine

## 2014-06-30 DIAGNOSIS — M549 Dorsalgia, unspecified: Secondary | ICD-10-CM | POA: Insufficient documentation

## 2014-06-30 DIAGNOSIS — K089 Disorder of teeth and supporting structures, unspecified: Secondary | ICD-10-CM | POA: Diagnosis not present

## 2014-06-30 DIAGNOSIS — F319 Bipolar disorder, unspecified: Secondary | ICD-10-CM | POA: Diagnosis not present

## 2014-06-30 DIAGNOSIS — IMO0002 Reserved for concepts with insufficient information to code with codable children: Secondary | ICD-10-CM | POA: Insufficient documentation

## 2014-06-30 DIAGNOSIS — M619 Calcification and ossification of muscle, unspecified: Secondary | ICD-10-CM | POA: Insufficient documentation

## 2014-06-30 DIAGNOSIS — R229 Localized swelling, mass and lump, unspecified: Secondary | ICD-10-CM | POA: Insufficient documentation

## 2014-06-30 DIAGNOSIS — K08109 Complete loss of teeth, unspecified cause, unspecified class: Secondary | ICD-10-CM | POA: Diagnosis not present

## 2014-06-30 DIAGNOSIS — M546 Pain in thoracic spine: Secondary | ICD-10-CM | POA: Insufficient documentation

## 2014-06-30 DIAGNOSIS — Z79899 Other long term (current) drug therapy: Secondary | ICD-10-CM | POA: Insufficient documentation

## 2014-06-30 DIAGNOSIS — F17200 Nicotine dependence, unspecified, uncomplicated: Secondary | ICD-10-CM | POA: Diagnosis not present

## 2014-06-30 DIAGNOSIS — M5489 Other dorsalgia: Secondary | ICD-10-CM

## 2014-06-30 DIAGNOSIS — Z85830 Personal history of malignant neoplasm of bone: Secondary | ICD-10-CM | POA: Insufficient documentation

## 2014-06-30 DIAGNOSIS — I10 Essential (primary) hypertension: Secondary | ICD-10-CM | POA: Insufficient documentation

## 2014-06-30 DIAGNOSIS — E119 Type 2 diabetes mellitus without complications: Secondary | ICD-10-CM | POA: Diagnosis not present

## 2014-06-30 LAB — CBC WITH DIFFERENTIAL/PLATELET
BASOS ABS: 0.1 10*3/uL (ref 0.0–0.1)
Basophils Relative: 1 % (ref 0–1)
EOS PCT: 5 % (ref 0–5)
Eosinophils Absolute: 0.5 10*3/uL (ref 0.0–0.7)
HCT: 42 % (ref 36.0–46.0)
Hemoglobin: 14.8 g/dL (ref 12.0–15.0)
Lymphocytes Relative: 43 % (ref 12–46)
Lymphs Abs: 4.3 10*3/uL — ABNORMAL HIGH (ref 0.7–4.0)
MCH: 31.8 pg (ref 26.0–34.0)
MCHC: 35.2 g/dL (ref 30.0–36.0)
MCV: 90.3 fL (ref 78.0–100.0)
MONO ABS: 0.5 10*3/uL (ref 0.1–1.0)
Monocytes Relative: 5 % (ref 3–12)
Neutro Abs: 4.6 10*3/uL (ref 1.7–7.7)
Neutrophils Relative %: 46 % (ref 43–77)
Platelets: 184 10*3/uL (ref 150–400)
RBC: 4.65 MIL/uL (ref 3.87–5.11)
RDW: 12.3 % (ref 11.5–15.5)
WBC: 9.9 10*3/uL (ref 4.0–10.5)

## 2014-06-30 LAB — BLOOD GAS, VENOUS
Acid-base deficit: 0.3 mmol/L (ref 0.0–2.0)
Bicarbonate: 25.6 mEq/L — ABNORMAL HIGH (ref 20.0–24.0)
O2 SAT: 34.9 %
Patient temperature: 98.6
TCO2: 23.1 mmol/L (ref 0–100)
pCO2, Ven: 48.7 mmHg (ref 45.0–50.0)
pH, Ven: 7.341 — ABNORMAL HIGH (ref 7.250–7.300)

## 2014-06-30 LAB — BASIC METABOLIC PANEL
ANION GAP: 12 (ref 5–15)
BUN: 8 mg/dL (ref 6–23)
CALCIUM: 8.7 mg/dL (ref 8.4–10.5)
CO2: 25 mEq/L (ref 19–32)
CREATININE: 0.83 mg/dL (ref 0.50–1.10)
Chloride: 99 mEq/L (ref 96–112)
GFR calc Af Amer: >90 mL/min (ref >90)
Glucose, Bld: 326 mg/dL — ABNORMAL HIGH (ref 70–99)
Potassium: 3.9 mEq/L (ref 3.7–5.3)
SODIUM: 136 meq/L — AB (ref 137–147)

## 2014-06-30 LAB — CBG MONITORING, ED: Glucose-Capillary: 288 mg/dL — ABNORMAL HIGH (ref 70–99)

## 2014-06-30 MED ORDER — SODIUM CHLORIDE 0.9 % IV BOLUS (SEPSIS)
1000.0000 mL | Freq: Once | INTRAVENOUS | Status: AC
Start: 2014-06-30 — End: 2014-06-30
  Administered 2014-06-30: 1000 mL via INTRAVENOUS

## 2014-06-30 MED ORDER — MORPHINE SULFATE 2 MG/ML IJ SOLN
2.0000 mg | Freq: Once | INTRAMUSCULAR | Status: AC
  Administered 2014-06-30: 2 mg via INTRAVENOUS
  Filled 2014-06-30: qty 1

## 2014-06-30 MED ORDER — HYDROMORPHONE HCL 1 MG/ML IJ SOLN
1.0000 mg | Freq: Once | INTRAMUSCULAR | Status: DC
  Filled 2014-06-30: qty 1

## 2014-06-30 NOTE — ED Notes (Signed)
Nurse Henderson Cloud, asked PA-C Cartner if pt could have a glucometer to use at home and also a back brace to help her to be able to sleep.

## 2014-06-30 NOTE — ED Notes (Signed)
Bladder scanner: 663mL

## 2014-06-30 NOTE — ED Notes (Signed)
Bladder Scanner: Post urination 24mL

## 2014-06-30 NOTE — ED Notes (Signed)
Pt c/o dental pain, a "bump on her back" and 2 toenails that fell off.

## 2014-06-30 NOTE — ED Notes (Signed)
Pt told me to ask PA-C if

## 2014-06-30 NOTE — ED Provider Notes (Signed)
CSN: [id number]     Arrival date & time 06/30/14  1655 History   First MD Initiated Contact with Patient 06/30/14 1709     Chief Complaint  Patient presents with  . Cyst  . Dental Pain  . Toenails fell off      (Consider location/radiation/quality/duration/timing/severity/associated sxs/prior Treatment) HPI YARIS KIBE is a 33 y.o. female with a reported history of bone cancer but was unable to pursue chemotherapy due to loss of Medicaid, who is here for evaluation of back pain. Patient states last week she started to feel a knot in the middle of her back that has become increasingly painful she rates as a 10 out of 10 now. Nothing makes the pain better but moving makes it worse. She denies IVDA, chronic steroid use, any injury or trauma to her back, no numbness or weakness no loss of bowel or bladder. Noother back pain red flags Since her Medicaid ran out she has been unable to see primary care for the past 3 months. She has been unable to take her insulin, metformin, blood pressure and cholesterol medication, or pain medication she takes for her diabetes. She also reports tooth loss and toe nail loss over the past week. She states last time she saw a dentist was 3 years ago and everything was fine. She reports being in a monogamous relationship for the past 5 years, her last intercourse was 2-1/2 months ago She denies fevers, chest pain, difficulty breathing, nausea vomiting or abdominal pain, diarrhea or constipation She currently smokes cigarettes but denies any other drug or illicit substance use.  Past Medical History  Diagnosis Date  . Hypertension   . Diabetes mellitus   . Hyperlipidemia   . Pregnancy complication     HELP Syndrome  . Complete miscarriage   . Bipolar affective   . Depression    Past Surgical History  Procedure Laterality Date  . Cesarean section    . Cholecystectomy    . Tubal ligation     No family history on file. History  Substance Use Topics  .  Smoking status: Current Every Day Smoker -- 0.50 packs/day    Types: Cigarettes  . Smokeless tobacco: Never Used  . Alcohol Use: No   OB History   Grav Para Term Preterm Abortions TAB SAB Ect Mult Living                 Review of Systems  Constitutional: Negative for fever.  Respiratory: Negative for shortness of breath.   Cardiovascular: Negative for chest pain.  Musculoskeletal: Positive for back pain.  Skin: Negative for rash.  Neurological: Negative for weakness and numbness.      Allergies  Review of patient's allergies indicates no known allergies.  Home Medications   Prior to Admission medications   Medication Sig Start Date End Date Taking? Authorizing Provider  ALPRAZolam Duanne Moron) 1 MG tablet Take 1 mg by mouth 3 (three) times daily.    Historical Provider, MD  HYDROcodone-acetaminophen (NORCO/VICODIN) 5-325 MG per tablet Take 2 tablets by mouth every 4 (four) hours as needed. 07/01/14   Viona Gilmore Liboria Putnam, PA-C  naproxen (NAPROSYN) 500 MG tablet Take 1 tablet (500 mg total) by mouth 2 (two) times daily. 07/01/14   Viona Gilmore Gaylon Melchor, PA-C  QUEtiapine (SEROQUEL) 400 MG tablet Take 400 mg by mouth at bedtime.    Historical Provider, MD   BP 130/89  Pulse 71  Temp(Src) 97.8 F (36.6 C) (Oral)  Resp 16  SpO2  98%  LMP 06/30/2014 Physical Exam  Nursing note and vitals reviewed. Constitutional:  Awake, alert, nontoxic appearance with baseline speech.  HENT:  Head: Atraumatic.  There is a chip in molar #2, but no missing whole teeth. Overall poor dentition. Active caries present. No sign of airway compromise. No tonsillar swelling, no uvular or glossal swelling. No evidence of active infection.  Eyes: Pupils are equal, round, and reactive to light. Right eye exhibits no discharge. Left eye exhibits no discharge.  Neck: Neck supple.  Cardiovascular: Normal rate and regular rhythm.   No murmur heard. Pulmonary/Chest: Effort normal and breath sounds normal. No  respiratory distress. She has no wheezes. She has no rales. She exhibits no tenderness.  Abdominal: Soft. Bowel sounds are normal. She exhibits no mass. There is no tenderness. There is no rebound.  Musculoskeletal:  Mild tenderness to thoracic midback. Potential bony deformity present at thoracic midback. No cysts, lesions or other skin changes appreciated. No other tenderness to cervical or lumbar spine. No neck stiffness or decreased ROM. Bilateral lower extremities non tender without new rashes or color change, baseline ROM with intact DP / PT pulses, CR<2 secs all digits bilaterally, sensation baseline light touch bilaterally for pt, motor symmetric bilateral 5 / 5 hip flexion, quadriceps, hamstrings, EHL, foot dorsiflexion, foot plantarflexion, gait somewhat antalgic but without apparent new ataxia.  Neurological:  Mental status baseline for patient.  Upper extremity motor strength and sensation intact and symmetric bilaterally.  Skin: No rash noted.  Toenails calcified and yellow, but without sign of infection or subungual hematoma. No missing or loose toenails.  Psychiatric: She has a normal mood and affect.    ED Course  Procedures (including critical care time) Labs Review Labs Reviewed  CBC WITH DIFFERENTIAL - Abnormal; Notable for the following:    Lymphs Abs 4.3 (*)    All other components within normal limits  BASIC METABOLIC PANEL - Abnormal; Notable for the following:    Sodium 136 (*)    Glucose, Bld 326 (*)    All other components within normal limits  BLOOD GAS, VENOUS - Abnormal; Notable for the following:    pH, Ven 7.341 (*)    Bicarbonate 25.6 (*)    All other components within normal limits  CBG MONITORING, ED - Abnormal; Notable for the following:    Glucose-Capillary 288 (*)    All other components within normal limits    Imaging Review Dg Thoracic Spine 2 View  06/30/2014   CLINICAL DATA:  Lower thoracic back pain radiating inferiorly.  EXAM: THORACIC  SPINE - 2 VIEW  COMPARISON:  Chest x-ray 01/02/2012  FINDINGS: In the mid thoracic spine there is a superior endplate concavity which is new from previous, compatible with fracture. This is likely at the level of T6. No subluxation. Height loss is minimal.  T12 wedging which appears stable from previous, including prominent osteophyte to the anterior superior corner.  There is no evidence of focal bone lesion or endplate erosion. Thoracic dextroscoliosis is considered positional based on previous imaging.  IMPRESSION: Age-indeterminate superior endplate fracture in the mid thoracic spine, likely T6. The fracture appears new from 2013. Height loss is minimal.   Electronically Signed   By: Jorje Guild M.D.   On: 06/30/2014 22:19   Dg Lumbar Spine Complete  06/30/2014   CLINICAL DATA:  Lower thoracic pain radiating into lumbar spine for 1 week  EXAM: LUMBAR SPINE - COMPLETE 4+ VIEW  COMPARISON:  None.  FINDINGS: Mild dextroscoliosis  of the lumbar spine. Minimal anterior listhesis of L5 on S1. Mild degenerative facet change at L5-S1. Minimal L4-5 spondylosis.  IMPRESSION: Mild degenerative changes with no acute findings   Electronically Signed   By: Skipper Cliche M.D.   On: 06/30/2014 22:24   Ct Thoracic Spine Wo Contrast  07/01/2014   CLINICAL DATA:  Back pain.  EXAM: CT THORACIC AND LUMBAR SPINE WITHOUT CONTRAST  TECHNIQUE: Multidetector CT imaging of the thoracic and lumbar spine was performed without contrast. Multiplanar CT image reconstructions were also generated.  COMPARISON:  Lumbar spine radiographs June 30, 2014  FINDINGS: CT THORACIC SPINE FINDINGS  There is normal alignment of the thoracic spine. Mild broad dextroscoliosis. Disk spaces are normal and there is no significant disk degeneration. The small T8-9 broad-based disc osteophyte complex. No spondylosis is identified and there is no spinal or foraminal stenosis. Scattered thoracic Schmorl's nodes. There is no prevertebral soft tissue  thickening. No fracture is identified in the thoracic spine. No mass lesion is present.  Included view of the chest demonstrates partially imaged centrilobular emphysema.  CT LUMBAR SPINE FINDINGS  The lumbar alignment is normal. No fracture or mass lesion is identified. Disk spaces are well maintained and there is no significant degenerative change or spurring. There is no spinal or foraminal stenosis. Severe LEFT L5-S1 facet arthropathy with corticated linear lucency through the LEFT L5 inferior facet suggesting chronic fragmentation/degenerative change. Moderate to severe LEFT L4-5 facet arthropathy appearing Included view of the abdomen demonstrates cholecystectomy.  IMPRESSION: CT THORACIC SPINE IMPRESSION  No acute fracture deformity or malalignment.  CT LUMBAR SPINE IMPRESSION  No acute fracture deformity or malalignment.  Severe lower LEFT lumbar facet arthropathy with corticated linear lucency through LEFT inferior L5 facet could reflect chronic stress injury.   Electronically Signed   By: Elon Alas   On: 07/01/2014 01:50   Ct Lumbar Spine Wo Contrast  07/01/2014   CLINICAL DATA:  Back pain.  EXAM: CT THORACIC AND LUMBAR SPINE WITHOUT CONTRAST  TECHNIQUE: Multidetector CT imaging of the thoracic and lumbar spine was performed without contrast. Multiplanar CT image reconstructions were also generated.  COMPARISON:  Lumbar spine radiographs June 30, 2014  FINDINGS: CT THORACIC SPINE FINDINGS  There is normal alignment of the thoracic spine. Mild broad dextroscoliosis. Disk spaces are normal and there is no significant disk degeneration. The small T8-9 broad-based disc osteophyte complex. No spondylosis is identified and there is no spinal or foraminal stenosis. Scattered thoracic Schmorl's nodes. There is no prevertebral soft tissue thickening. No fracture is identified in the thoracic spine. No mass lesion is present.  Included view of the chest demonstrates partially imaged centrilobular  emphysema.  CT LUMBAR SPINE FINDINGS  The lumbar alignment is normal. No fracture or mass lesion is identified. Disk spaces are well maintained and there is no significant degenerative change or spurring. There is no spinal or foraminal stenosis. Severe LEFT L5-S1 facet arthropathy with corticated linear lucency through the LEFT L5 inferior facet suggesting chronic fragmentation/degenerative change. Moderate to severe LEFT L4-5 facet arthropathy appearing Included view of the abdomen demonstrates cholecystectomy.  IMPRESSION: CT THORACIC SPINE IMPRESSION  No acute fracture deformity or malalignment.  CT LUMBAR SPINE IMPRESSION  No acute fracture deformity or malalignment.  Severe lower LEFT lumbar facet arthropathy with corticated linear lucency through LEFT inferior L5 facet could reflect chronic stress injury.   Electronically Signed   By: Elon Alas   On: 07/01/2014 01:50     EKG Interpretation None  Meds given in ED:  Medications  sodium chloride 0.9 % bolus 1,000 mL (1,000 mLs Intravenous New Bag/Given 06/30/14 1820)  morphine 2 MG/ML injection 2 mg (2 mg Intravenous Given 06/30/14 2126)    Discharge Medication List as of 07/01/2014  2:08 AM    START taking these medications   Details  HYDROcodone-acetaminophen (NORCO/VICODIN) 5-325 MG per tablet Take 2 tablets by mouth every 4 (four) hours as needed., Starting 07/01/2014, Until Discontinued, Print    naproxen (NAPROSYN) 500 MG tablet Take 1 tablet (500 mg total) by mouth 2 (two) times daily., Starting 07/01/2014, Until Discontinued, Print       Filed Vitals:   06/30/14 1706 06/30/14 2101 07/01/14 0114 07/01/14 0216  BP: 110/86 115/70 130/72 130/89  Pulse: 80 72 75 71  Temp: 98.5 F (36.9 C) 98.3 F (36.8 C) 98.1 F (36.7 C) 97.8 F (36.6 C)  TempSrc: Oral Oral Oral Oral  Resp: 18 18 18 16   SpO2: 100% 100% 100% 98%     MDM  Vitals stable - WNL -afebrile Pt resting comfortably in ED.  Pain managed in ED. No evidence  of DKA or other diabetic emergency. PE-digital rectal exam showed normal sphincter tone, no focal neurodeficits. Neurovascularly intact Labwork noncontributory, no anion gap Imaging- x-ray shows age indeterminate endplate fracture of T6, no other acute abnormalities. CT shows no acute abnormalities, no evidence of fracture or dislocation. Epidural abscess less likely. Will DC with resource guide for primary care followup, naproxen for inflammation and pain, Vicodin #13 for pain management. Discussed f/u with PCP and return precautions, pt very amenable to plan.   Prior to patient discharge, I discussed and reviewed this case with Dr.Wofford and [employer], PA-C.     Final diagnoses:  Right-sided back pain, unspecified location         Verl Dicker, PA-C 07/01/14 1420

## 2014-06-30 NOTE — ED Notes (Signed)
Pt reports that she has pain in RL tooth. Looks like some irritation at the mucosa wall.  Pt sts back pain x 1 week.  With knot in middle of back over spine.  Pt sts sore all over.  Showed me her toes and states he toenails are fallling off on by one.  The big toe on Lt foot has a dark outline around toenail.

## 2014-07-01 ENCOUNTER — Emergency Department (HOSPITAL_COMMUNITY): Payer: Medicaid Other

## 2014-07-01 MED ORDER — NAPROXEN 500 MG PO TABS: 500.0000 mg | ORAL_TABLET | Freq: Two times a day (BID) | ORAL | Status: DC

## 2014-07-01 MED ORDER — HYDROCODONE-ACETAMINOPHEN 5-325 MG PO TABS: 2.0000 | ORAL_TABLET | ORAL | Status: DC | PRN

## 2014-07-01 NOTE — Discharge Instructions (Signed)
Back Pain, Adult °Low back pain is very common. About 1 in 5 people have back pain. The cause of low back pain is rarely dangerous. The pain often gets better over time. About half of people with a sudden onset of back pain feel better in just 2 weeks. About 8 in 10 people feel better by 6 weeks.  °CAUSES °Some common causes of back pain include: °· Strain of the muscles or ligaments supporting the spine. °· Wear and tear (degeneration) of the spinal discs. °· Arthritis. °· Direct injury to the back. °DIAGNOSIS °Most of the time, the direct cause of low back pain is not known. However, back pain can be treated effectively even when the exact cause of the pain is unknown. Answering your caregiver's questions about your overall health and symptoms is one of the most accurate ways to make sure the cause of your pain is not dangerous. If your caregiver needs more information, he or she may order lab work or imaging tests (X-rays or MRIs). However, even if imaging tests show changes in your back, this usually does not require surgery. °HOME CARE INSTRUCTIONS °For many people, back pain returns. Since low back pain is rarely dangerous, it is often a condition that people can learn to manage on their own.  °· Remain active. It is stressful on the back to sit or stand in one place. Do not sit, drive, or stand in one place for more than 30 minutes at a time. Take short walks on level surfaces as soon as pain allows. Try to increase the length of time you walk each day. °· Do not stay in bed. Resting more than 1 or 2 days can delay your recovery. °· Do not avoid exercise or work. Your body is made to move. It is not dangerous to be active, even though your back may hurt. Your back will likely heal faster if you return to being active before your pain is gone. °· Pay attention to your body when you  bend and lift. Many people have less discomfort when lifting if they bend their knees, keep the load close to their bodies, and  avoid twisting. Often, the most comfortable positions are those that put less stress on your recovering back. °· Find a comfortable position to sleep. Use a firm mattress and lie on your side with your knees slightly bent. If you lie on your back, put a pillow under your knees. °· Only take over-the-counter or prescription medicines as directed by your caregiver. Over-the-counter medicines to reduce pain and inflammation are often the most helpful. Your caregiver may prescribe muscle relaxant drugs. These medicines help dull your pain so you can more quickly return to your normal activities and healthy exercise. °· Put ice on the injured area. °¨ Put ice in a plastic bag. °¨ Place a towel between your skin and the bag. °¨ Leave the ice on for 15-20 minutes, 03-04 times a day for the first 2 to 3 days. After that, ice and heat may be alternated to reduce pain and spasms. °· Ask your caregiver about trying back exercises and gentle massage. This may be of some benefit. °· Avoid feeling anxious or stressed. Stress increases muscle tension and can worsen back pain. It is important to recognize when you are anxious or stressed and learn ways to manage it. Exercise is a great option. °SEEK MEDICAL CARE IF: °· You have pain that is not relieved with rest or medicine. °· You have pain that does not improve in 1 week. °· You have new symptoms. °· You are generally not feeling well. °SEEK   IMMEDIATE MEDICAL CARE IF:  °· You have pain that radiates from your back into your legs. °· You develop new bowel or bladder control problems. °· You have unusual weakness or numbness in your arms or legs. °· You develop nausea or vomiting. °· You develop abdominal pain. °· You feel faint. °Document Released: 09/27/2005 Document Revised: 03/28/2012 Document Reviewed: 01/29/2014 °ExitCare® Patient Information ©2015 [employer]. This information is not intended to replace advice given to you by your health care provider. Make sure you  discuss any questions you have with your health care provider. ° ° ° ° °Emergency Department Resource Guide °1) Find a Doctor and Pay Out of Pocket °Although you won't have to find out who is covered by your insurance plan, it is a good idea to ask around and get recommendations. You will then need to call the office and see if the doctor you have chosen will accept you as a new patient and what types of options they offer for patients who are self-pay. Some doctors offer discounts or will set up payment plans for their patients who do not have insurance, but you will need to ask so you aren't surprised when you get to your appointment. ° °2) Contact Your Local Health Department °Not all health departments have doctors that can see patients for sick visits, but many do, so it is worth a call to see if yours does. If you don't know where your local health department is, you can check in your phone book. The CDC also has a tool to help you locate your state's health department, and many state websites also have listings of all of their local health departments. ° °3) Find a Walk-in Clinic °If your illness is not likely to be very severe or complicated, you may want to try a walk in clinic. These are popping up all over the country in pharmacies, drugstores, and shopping centers. They're usually staffed by nurse practitioners or physician assistants that have been trained to treat common illnesses and complaints. They're usually fairly quick and inexpensive. However, if you have serious medical issues or chronic medical problems, these are probably not your best option. ° °No Primary Care Doctor: °- Call [facility] at  832-8000 - they can help you locate a primary care doctor that  accepts your insurance, provides certain services, etc. °- Physician Referral Service- 1-800-533-3463 ° °Chronic Pain Problems: °Organization         Address  Phone   Notes  °[facility]  (336) 297-2271 Patients need  to be referred by their primary care doctor.  ° °Medication Assistance: °Organization         Address  Phone   Notes  °[place] [facility] [address] °[place], [place] [address] (336) 641-8030 --Must be a resident of [place] °-- Must have NO insurance coverage whatsoever (no Medicaid/ Medicare, etc.) °-- The pt. MUST have a primary care doctor that directs their care regularly and follows them in the community °  °[facility]  (866) 331-1348   °[employer]  (888) 892-1162   ° °Agencies that provide inexpensive medical care: °Organization         Address  Phone   Notes  °[facility]  (336) 832-8035   °[facility]    (336) 832-7272   °[facility] [address] °[place], [place] [address] (336) 832-4777   °[facility] [address], °[place] (336) 271-4999   °[facility]    (336) 373-0678   °[facility]    (336) 272-1050   °[facility] ° [address], [place] Phone:  (336) 832-4444, Fax:  (336) 832-4440 Hours of Operation:  9 am - 6 pm, M-F.  Also accepts Medicaid/Medicare and self-pay.  °[facility] ° [address], [place] Phone: (336) 832-3150, Fax: (336) 832-3151. Hours of Operation:  8:30 am - 5:30 pm, M-F.  Also accepts Medicaid and self-pay.  °[facility] [place] [address], [place] Phone: (336) 878-6027   °[facility] [address], [place], [place] (336)723-1848, Ext. 123 Mondays & Thursdays: 7-9 AM.  First 15 patients are seen on a first come, first serve basis. °  ° °Medicaid-accepting [place] Providers: ° °Organization         Address  Phone   Notes  °[facility] [address], [place] (336) 641-2100 Also accepts self-pay patients.  °[facility] [address], [place] ° (336) 856-9996   °[facility] [address], [place] (336) 288-8857   °[facility] [address] [place] [address], [place] (336) 299-7000   °Veita Bland [address], [place]  ° (336) 373-1557 Only accepts [place] Access Medicaid patients after they have their name applied to their card.  ° °Self-Pay (no insurance) in [place]: ° °Organization         Address  Phone   Notes  °Sickle Cell Patients, [facility] [address], [place] (336) 832-1970   °[facility] [address], [place] (336) 832-4400   °[facility] ° [address], [place] (336) 992-4800   °[facility]/Dr. Osei-Bonsu ° [address] [place] [address], [place] or [address], [place] (336) 841-8500 Phone number for both [place] and [place] locations is the same.  °[facility] [address], [place] (336) 299-0000   °[facility] [address] [place] [address], [place] or [address] (336) 852-7530 °(336) 878-2260   °[facility] [address], [place] (336) 350-1642, phone; (336) 294-5005, fax Sees patients 1st and 3rd Saturday of every month.  Must not qualify for public or private insurance (i.e. Medicaid, Medicare, [place] Health Choice, Veterans' Benefits) • Household income should be no more than 200% of the poverty level •The clinic cannot treat you if you are pregnant or think you are pregnant • Sexually transmitted diseases are not treated at the clinic.  ° ° °Dental Care: °Organization         Address  Phone  Notes  °[facility] [address], [place] (336) 641-6152 Accepts children up to age 21 who are enrolled in Medicaid or [place] Health Choice; pregnant women with a Medicaid card; and children who have applied for Medicaid or  Health Choice, but were declined, whose  parents can pay a reduced fee at time of service.  °[facility] [place]  [address], [place] (336) 641-7733 Accepts children up to age 21 who are enrolled in Medicaid or  Health Choice; pregnant women with a Medicaid card; and children who have applied for Medicaid   or [place] Health Choice, but were declined, whose parents can pay a reduced fee at time of service.  °[facility] ° [address], [place] (336) 641-4533 Patients are seen by appointment only. Walk-ins are not accepted. [facility] will see patients 18 years of age and older. °Monday - Tuesday (8am-5pm) °Most Wednesdays (8:30-5pm) °$30 per visit, cash only  °[facility] ° [address], [place] (336) 641-4533 Patients are seen by appointment only. Walk-ins are not accepted. [facility] will see patients 18 years of age and older. °One Wednesday Evening (Monthly: Volunteer Based).  $30 per visit, cash only  °[facility]  (919) 537-3737 for adults; Children under age 4, call Graduate Pediatric Dentistry at (919) [phone]. Children aged 4-14, please call (919) [phone] to request a pediatric application. ° Dental services are provided in all areas of dental care including fillings, crowns and bridges, complete and partial dentures, implants, gum treatment, root canals, and extractions. Preventive care is also provided. Treatment is provided to both adults and children. °Patients are selected via a lottery and there is often a waiting list. °  °[facility] [address], °[place] ° (336) 763-8833 [email or web address] °  °[facility] [address], [place], [place] (336)723-1848, Ext. 123 Second and Fourth Thursday of each month, opens at 6:30 AM; Clinic ends at 9 AM.  Patients are seen on a first-come first-served basis, and a limited number are seen during each clinic.  ° °[facility] °  [address], [place], [place] (336) 723-7904   Eligibility Requirements °You must have lived in [place], [place], or [place] counties for at least the last three months. °  You cannot be eligible for state or federal sponsored healthcare insurance, including Veterans Administration, Medicaid, or Medicare. °  You generally cannot be eligible for healthcare insurance through your employer.  °  How to apply: °Eligibility screenings are held every Tuesday and Wednesday afternoon from 1:00 pm until 4:00 pm. You do not need an appointment for the interview!  °[facility] [address], [place], [place] 336-631-2330   °[facility]  336-342-8273   °[facility]  336-703-3100   °[facility]  336-570-6415   ° °Behavioral Health Resources in the Community: °Intensive Outpatient Programs °Organization         Address  Phone  Notes  °[facility] [address], [place], [place] 336-878-6098   °[facility] [address], [place], [place] 336-832-9800   °[facility] [address], [place], [place] ° 336-882-2125   °[facility] [address],  °[place], [place] 1-800-853-5163 or 336-641-4981   °Substance Abuse Resources °Organization         Address  Phone  Notes  °[facility]  336-882-2125   °[facility]  336-784-9470   °[facility]  336-285-9073   °[facility]  336-845-3988   °Residential & Outpatient Substance Abuse Program  1-800-659-3381   °Psychological Services °Organization         Address  Phone  Notes  °[facility]  336- 832-9600   °[facility]  336- 378-7881   °[facility] [address], [place] 1-800-853-5163 or 336-641-4981   ° °Mobile Crisis Teams °Organization         Address  Phone    Notes  °[facility], Mobile Crisis Care Unit  1-877-626-1772     °Assertive °[facility] ° [address] [place], [place] 336-834-9664   °Sharon DeEsch [address] °[place] 336-554-5454   ° °Self-Help/Support Groups °Organization         Address  Phone             Notes  °[facility] [place] [facility] - variety of support groups  336- 373-1402 Call for more information  °Narcotics Anonymous (NA), [facility] [address], °[place]  2 meetings at this location  ° °Residential Treatment Programs °Organization         Address  Phone  Notes  °[facility] [address],    °[place]  1-866-801-8205   °[facility] ° [address], [place], [place] 704-293-8524   °[facility] [address], [place] 336-845-3988 Admissions: 8am-3pm M-F  °[facility] [address]    °[place], [place] 336-841-1104   °[facility] [address], [place], [place] 336-379-7146   °[facility] [address]  °[place], [place] 336-285-9073   °[facility] Intensive Outpatient [address], [place], [place] 336-852-3033   °[facility] [address]  °[place], [place] 1-877-615-2722 or 336-784-9470   °[facility] [address] [place], [place] 336-227-7417 Accepts Medicaid  °[facility] [address]  °[place] 1-800-659-3381 Substance Abuse/Addiction Treatment  ° °[place] Behavioral Health Resources °Organization         Address  Phone  Notes  °[facility]  (888) 581-9988   °Julie Brannon, PhD [address] [place], [place]   (336) 349-5553 or (336) 951-0000   ° Behavioral   [address] °[place], [place] (336) 349-4454   °[facility] [address], [place], [place] (336) 342-8316 Insurance/Medicaid/sponsorship through [facility]  °[facility] [address]                                     [place], [place] (336) 342-8316 Therapy/tele-psych/case  °[facility] [address]  ° [place], [place] (336) 349-2233    °Dr. Arfeen  (336) 349-4544   °[facility] [place]  [employer] [place] [facility] 1) [address], [place] °2) [address], [place] °3)  [address], [place] (336) 349-3220 °(336) 342-7768 ° °(336) 342-8140   °[place] [facility] (336) 342-1394 or (336) 342-3537 (After Hours)    ° ° ° ° °

## 2014-07-05 NOTE — ED Provider Notes (Signed)
Medical screening examination/treatment/procedure(s) were conducted as a shared visit with non-physician practitioner(s) and myself.  I personally evaluated the patient during the encounter.   EKG Interpretation None        Houston Siren III, MD 07/05/14 [phone]

## 2014-07-05 NOTE — ED Provider Notes (Signed)
Medical screening examination/treatment/procedure(s) were conducted as a shared visit with non-physician practitioner(s) and myself.  I personally evaluated the patient during the encounter.   EKG Interpretation None      34 yo female with hx of diabetes presenting with multiple complaints including back pain.  On exam, well appearing, nontoxic, not distressed, normal respiratory effort, normal perfusion, t spine tender to palpation, abdomen soft and nontender, limited lower extremity strength exam secondary to pain and pt effort.  ED workup showed hyperglycemia (to follow up outpatient), possible T6 fracture not confirmed on CT.    Clinical Impression: 1. Right-sided back pain, unspecified location       Houston Siren III, MD 07/05/14 1520

## 2014-07-13 ENCOUNTER — Encounter (HOSPITAL_COMMUNITY): Payer: Self-pay | Admitting: Emergency Medicine

## 2014-07-13 ENCOUNTER — Emergency Department (HOSPITAL_COMMUNITY)
Admission: EM | Admit: 2014-07-13 | Discharge: 2014-07-13 | Disposition: A | Discharge status: Home / Self Care | Payer: Medicaid Other | Attending: Emergency Medicine | Admitting: Emergency Medicine

## 2014-07-13 DIAGNOSIS — F319 Bipolar disorder, unspecified: Secondary | ICD-10-CM | POA: Diagnosis not present

## 2014-07-13 DIAGNOSIS — F209 Schizophrenia, unspecified: Secondary | ICD-10-CM | POA: Insufficient documentation

## 2014-07-13 DIAGNOSIS — I10 Essential (primary) hypertension: Secondary | ICD-10-CM | POA: Diagnosis not present

## 2014-07-13 DIAGNOSIS — Z79899 Other long term (current) drug therapy: Secondary | ICD-10-CM | POA: Diagnosis not present

## 2014-07-13 DIAGNOSIS — Z720 Tobacco use: Secondary | ICD-10-CM | POA: Insufficient documentation

## 2014-07-13 DIAGNOSIS — K0889 Other specified disorders of teeth and supporting structures: Secondary | ICD-10-CM

## 2014-07-13 DIAGNOSIS — K0381 Cracked tooth: Secondary | ICD-10-CM | POA: Insufficient documentation

## 2014-07-13 DIAGNOSIS — E119 Type 2 diabetes mellitus without complications: Secondary | ICD-10-CM | POA: Diagnosis not present

## 2014-07-13 DIAGNOSIS — K088 Other specified disorders of teeth and supporting structures: Secondary | ICD-10-CM | POA: Diagnosis present

## 2014-07-13 DIAGNOSIS — Z791 Long term (current) use of non-steroidal anti-inflammatories (NSAID): Secondary | ICD-10-CM | POA: Diagnosis not present

## 2014-07-13 MED ORDER — IBUPROFEN 800 MG PO TABS: 800.0000 mg | ORAL_TABLET | Freq: Three times a day (TID) | ORAL | Status: DC

## 2014-07-13 MED ORDER — IBUPROFEN 800 MG PO TABS: 800.0000 mg | ORAL_TABLET | Freq: Once | ORAL | Status: DC

## 2014-07-13 MED ORDER — PENICILLIN V POTASSIUM 500 MG PO TABS: 500.0000 mg | ORAL_TABLET | Freq: Three times a day (TID) | ORAL | Status: DC

## 2014-07-13 NOTE — Discharge Instructions (Signed)

## 2014-07-13 NOTE — ED Notes (Signed)
Pt arrived to the ED with a complaint of dental pain.  Pt has a broken tooth that occurred  A month ago on the lower right side.  Pt has not seen a dentist.

## 2014-07-13 NOTE — ED Notes (Signed)
Pt not in room to receive d/c instructions.

## 2014-07-13 NOTE — ED Provider Notes (Signed)
CSN: [id number]     Arrival date & time 07/13/14  1946 History   First MD Initiated Contact with Patient 07/13/14 2013     Chief Complaint  Patient presents with  . Dental Pain     (Consider location/radiation/quality/duration/timing/severity/associated sxs/prior Treatment) HPI  33 year old female with history of bipolar, depression, diabetes, hypertension presents complaining of dental pain. Patient reports she has a broken tooth to the right lower jaw a month ago that has never bothered her until last night she developed sharp throbbing pain to the tooth after eating. She felt as if she may have chipped the tooth. Pain is now radiating throughout her jaw. Pain worsened with chewing, cold air or with eating. Her friend they gave her some ibuprofen which provide no relief. She was also given some Vicodin which provide some relief. She denies any fever, throat swelling, hearing loss, and neck pain, or rash. Denies having similar pain to the same tooth in the past.  Past Medical History  Diagnosis Date  . Hypertension   . Diabetes mellitus   . Hyperlipidemia   . Pregnancy complication     HELP Syndrome  . Complete miscarriage   . Bipolar affective   . Depression    Past Surgical History  Procedure Laterality Date  . Cesarean section    . Cholecystectomy    . Tubal ligation     History reviewed. No pertinent family history. History  Substance Use Topics  . Smoking status: Current Every Day Smoker -- 0.50 packs/day    Types: Cigarettes  . Smokeless tobacco: Never Used  . Alcohol Use: No   OB History   Grav Para Term Preterm Abortions TAB SAB Ect Mult Living                 Review of Systems  Constitutional: Negative for fever.  HENT: Positive for dental problem.   Skin: Negative for rash.      Allergies  Review of patient's allergies indicates no known allergies.  Home Medications   Prior to Admission medications   Medication Sig Start Date End Date Taking?  Authorizing Provider  ALPRAZolam Duanne Moron) 1 MG tablet Take 1 mg by mouth 3 (three) times daily.    Historical Provider, MD  HYDROcodone-acetaminophen (NORCO/VICODIN) 5-325 MG per tablet Take 2 tablets by mouth every 4 (four) hours as needed. 07/01/14   Viona Gilmore Cartner, PA-C  naproxen (NAPROSYN) 500 MG tablet Take 1 tablet (500 mg total) by mouth 2 (two) times daily. 07/01/14   Viona Gilmore Cartner, PA-C  QUEtiapine (SEROQUEL) 400 MG tablet Take 400 mg by mouth at bedtime.    Historical Provider, MD   BP 138/97  Pulse 109  Temp(Src) 98 F (36.7 C) (Oral)  Resp 18  SpO2 100%  LMP 06/30/2014 Physical Exam  Nursing note and vitals reviewed. Constitutional: She appears well-developed and well-nourished. No distress.  HENT:  Head: Atraumatic.  Right Ear: External ear normal.  Left Ear: External ear normal.  Nose: Nose normal.  Mouth/Throat: Oropharynx is clear and moist.  Partial dental fracture noted to tooth #31, ttp, no obvious active infection, no abscess.  No facial swelling.    Eyes: Conjunctivae are normal.  Neck: Neck supple.  Lymphadenopathy:    She has no cervical adenopathy.  Neurological: She is alert.  Skin: No rash noted.  Psychiatric: She has a normal mood and affect.    ED Course  Procedures (including critical care time)  8:22 PM Patient here complaining of dental pain.  She does have a partial avulsion fracture of right lower molar, likely old. No obvious abscess or active infection on visualization. I offer patient dental block, patient refused. Plan to provide dental referral, and antibiotic.  8:53 PM Upon discharging pt, pt request to speak with a physician provider since she did not receive narcotic pain medication.  I have consulted with Dr. Tamera Punt, who agrees to evaluate pt.    Labs Review Labs Reviewed - No data to display  Imaging Review No results found.   EKG Interpretation None      MDM   Final diagnoses:  Pain, dental    BP 126/94  Pulse  100  Temp(Src) 98 F (36.7 C) (Oral)  Resp 16  SpO2 100%  LMP 06/30/2014     Domenic Moras, PA-C 07/13/14 2054

## 2014-07-13 NOTE — ED Provider Notes (Signed)
Medical screening examination/treatment/procedure(s) were performed by non-physician practitioner and as supervising physician I was immediately available for consultation/collaboration.   EKG Interpretation None        Malvin Johns, MD 07/13/14 2308

## 2014-07-13 NOTE — ED Notes (Signed)
Pt upset about PA's evaluation and plan of her symptoms. Pt requesting to speak to MD. PA notified. Pt refusing Motrin because she states it doesn't work.

## 2014-07-16 ENCOUNTER — Emergency Department (HOSPITAL_COMMUNITY): Payer: Medicaid Other

## 2014-07-16 ENCOUNTER — Emergency Department (HOSPITAL_COMMUNITY)
Admission: EM | Admit: 2014-07-16 | Discharge: 2014-07-16 | Disposition: A | Discharge status: Home / Self Care | Payer: Medicaid Other | Attending: Emergency Medicine | Admitting: Emergency Medicine

## 2014-07-16 ENCOUNTER — Encounter (HOSPITAL_COMMUNITY): Payer: Self-pay | Admitting: Emergency Medicine

## 2014-07-16 DIAGNOSIS — Z791 Long term (current) use of non-steroidal anti-inflammatories (NSAID): Secondary | ICD-10-CM | POA: Diagnosis not present

## 2014-07-16 DIAGNOSIS — R55 Syncope and collapse: Secondary | ICD-10-CM | POA: Insufficient documentation

## 2014-07-16 DIAGNOSIS — Z720 Tobacco use: Secondary | ICD-10-CM | POA: Diagnosis not present

## 2014-07-16 DIAGNOSIS — R739 Hyperglycemia, unspecified: Secondary | ICD-10-CM

## 2014-07-16 DIAGNOSIS — E785 Hyperlipidemia, unspecified: Secondary | ICD-10-CM | POA: Insufficient documentation

## 2014-07-16 DIAGNOSIS — I10 Essential (primary) hypertension: Secondary | ICD-10-CM | POA: Diagnosis not present

## 2014-07-16 DIAGNOSIS — F209 Schizophrenia, unspecified: Secondary | ICD-10-CM | POA: Diagnosis not present

## 2014-07-16 DIAGNOSIS — F319 Bipolar disorder, unspecified: Secondary | ICD-10-CM | POA: Insufficient documentation

## 2014-07-16 DIAGNOSIS — Z9119 Patient's noncompliance with other medical treatment and regimen: Secondary | ICD-10-CM | POA: Insufficient documentation

## 2014-07-16 DIAGNOSIS — Z79899 Other long term (current) drug therapy: Secondary | ICD-10-CM | POA: Insufficient documentation

## 2014-07-16 DIAGNOSIS — Z794 Long term (current) use of insulin: Secondary | ICD-10-CM | POA: Insufficient documentation

## 2014-07-16 DIAGNOSIS — Z3202 Encounter for pregnancy test, result negative: Secondary | ICD-10-CM | POA: Insufficient documentation

## 2014-07-16 DIAGNOSIS — Z9114 Patient's other noncompliance with medication regimen: Secondary | ICD-10-CM

## 2014-07-16 DIAGNOSIS — E1165 Type 2 diabetes mellitus with hyperglycemia: Secondary | ICD-10-CM | POA: Insufficient documentation

## 2014-07-16 HISTORY — DX: Schizophrenia, unspecified: F20.9

## 2014-07-16 LAB — URINALYSIS, ROUTINE W REFLEX MICROSCOPIC
BILIRUBIN URINE: NEGATIVE
HGB URINE DIPSTICK: NEGATIVE
KETONES UR: NEGATIVE mg/dL
Leukocytes, UA: NEGATIVE
NITRITE: POSITIVE — AB
Protein, ur: NEGATIVE mg/dL
Specific Gravity, Urine: 1.035 — ABNORMAL HIGH (ref 1.005–1.030)
Urobilinogen, UA: 0.2 mg/dL (ref 0.0–1.0)
pH: 5 (ref 5.0–8.0)

## 2014-07-16 LAB — COMPREHENSIVE METABOLIC PANEL
AST: 8 U/L (ref 0–37)
Albumin: 3.1 g/dL — ABNORMAL LOW (ref 3.5–5.2)
Alkaline Phosphatase: 63 U/L (ref 39–117)
Anion gap: 13 (ref 5–15)
BILIRUBIN TOTAL: 0.4 mg/dL (ref 0.3–1.2)
BUN: 13 mg/dL (ref 6–23)
CHLORIDE: 95 meq/L — AB (ref 96–112)
CO2: 24 mEq/L (ref 19–32)
Calcium: 8.9 mg/dL (ref 8.4–10.5)
Creatinine, Ser: 0.88 mg/dL (ref 0.50–1.10)
GFR calc Af Amer: >90 mL/min (ref >90)
GFR calc non Af Amer: 84 mL/min — ABNORMAL LOW (ref >90)
Glucose, Bld: 524 mg/dL — ABNORMAL HIGH (ref 70–99)
POTASSIUM: 3.9 meq/L (ref 3.7–5.3)
SODIUM: 132 meq/L — AB (ref 137–147)
TOTAL PROTEIN: 7 g/dL (ref 6.0–8.3)

## 2014-07-16 LAB — POC URINE PREG, ED: Preg Test, Ur: NEGATIVE

## 2014-07-16 LAB — CBC
HEMATOCRIT: 41.5 % (ref 36.0–46.0)
Hemoglobin: 14.8 g/dL (ref 12.0–15.0)
MCH: 32.2 pg (ref 26.0–34.0)
MCHC: 35.7 g/dL (ref 30.0–36.0)
MCV: 90.4 fL (ref 78.0–100.0)
PLATELETS: 198 10*3/uL (ref 150–400)
RBC: 4.59 MIL/uL (ref 3.87–5.11)
RDW: 12.9 % (ref 11.5–15.5)
WBC: 12.6 10*3/uL — AB (ref 4.0–10.5)

## 2014-07-16 LAB — CBG MONITORING, ED
GLUCOSE-CAPILLARY: 532 mg/dL — AB (ref 70–99)
Glucose-Capillary: 461 mg/dL — ABNORMAL HIGH (ref 70–99)
Glucose-Capillary: 334 mg/dL — ABNORMAL HIGH (ref 70–99)
Glucose-Capillary: 168 mg/dL — ABNORMAL HIGH (ref 70–99)

## 2014-07-16 LAB — AMMONIA: AMMONIA: 11 umol/L (ref 11–60)

## 2014-07-16 LAB — URINE MICROSCOPIC-ADD ON

## 2014-07-16 LAB — RAPID URINE DRUG SCREEN, HOSP PERFORMED
Amphetamines: NOT DETECTED
Barbiturates: NOT DETECTED
Benzodiazepines: POSITIVE — AB
Cocaine: NOT DETECTED
Opiates: NOT DETECTED
Tetrahydrocannabinol: NOT DETECTED

## 2014-07-16 LAB — ETHANOL

## 2014-07-16 LAB — I-STAT CG4 LACTIC ACID, ED: LACTIC ACID, VENOUS: 1.47 mmol/L (ref 0.5–2.2)

## 2014-07-16 MED ORDER — SODIUM CHLORIDE 0.9 % IV BOLUS (SEPSIS)
1000.0000 mL | Freq: Once | INTRAVENOUS | Status: AC
  Administered 2014-07-16: 1000 mL via INTRAVENOUS

## 2014-07-16 MED ORDER — CYCLOBENZAPRINE HCL 10 MG PO TABS
10.0000 mg | ORAL_TABLET | Freq: Once | ORAL | Status: AC
  Administered 2014-07-16: 10 mg via ORAL
  Filled 2014-07-16: qty 1

## 2014-07-16 MED ORDER — FENTANYL CITRATE 0.05 MG/ML IJ SOLN
50.0000 ug | Freq: Once | INTRAMUSCULAR | Status: AC
  Administered 2014-07-16: 50 ug via INTRAVENOUS
  Filled 2014-07-16: qty 2

## 2014-07-16 MED ORDER — METFORMIN HCL 1000 MG PO TABS: 1000.0000 mg | ORAL_TABLET | Freq: Two times a day (BID) | ORAL | Status: DC

## 2014-07-16 MED ORDER — INSULIN ASPART 100 UNIT/ML ~~LOC~~ SOLN
10.0000 [IU] | Freq: Once | SUBCUTANEOUS | Status: AC
  Administered 2014-07-16: 10 [IU] via SUBCUTANEOUS

## 2014-07-16 MED ORDER — CYCLOBENZAPRINE HCL 10 MG PO TABS: 10.0000 mg | ORAL_TABLET | Freq: Two times a day (BID) | ORAL | Status: DC | PRN

## 2014-07-16 MED ORDER — SODIUM CHLORIDE 0.9 % IV BOLUS (SEPSIS)
1000.0000 mL | Freq: Once | INTRAVENOUS | Status: AC
Start: 2014-07-16 — End: 2014-07-16
  Administered 2014-07-16: 1000 mL via INTRAVENOUS

## 2014-07-16 NOTE — Discharge Instructions (Signed)
Please follow with your primary care doctor in the next 2 days for a check-up. They must obtain records for further management.   Do not hesitate to return to the Emergency Department for any new, worsening or concerning symptoms.    Blood Glucose Monitoring Monitoring your blood glucose (also know as blood sugar) helps you to manage your diabetes. It also helps you and your health care provider monitor your diabetes and determine how well your treatment plan is working. WHY SHOULD YOU MONITOR YOUR BLOOD GLUCOSE?  It can help you understand how food, exercise, and medicine affect your blood glucose.  It allows you to know what your blood glucose is at any given moment. You can quickly tell if you are having low blood glucose (hypoglycemia) or high blood glucose (hyperglycemia).  It can help you and your health care provider know how to adjust your medicines.  It can help you understand how to manage an illness or adjust medicine for exercise. WHEN SHOULD YOU TEST? Your health care provider will help you decide how often you should check your blood glucose. This may depend on the type of diabetes you have, your diabetes control, or the types of medicines you are taking. Be sure to write down all of your blood glucose readings so that this information can be reviewed with your health care provider. See below for examples of testing times that your health care provider may suggest. Type 1 Diabetes  Test 4 times a day if you are in good control, using an insulin pump, or perform multiple daily injections.  If your diabetes is not well controlled or if you are sick, you may need to monitor more often.  It is a good idea to also monitor:  Before and after exercise.  Between meals and 2 hours after a meal.  Occasionally between 2:00 a.m. and 3:00 a.m. Type 2 Diabetes  It can vary with each person, but generally, if you are on insulin, test 4 times a day.  If you take medicines by mouth  (orally), test 2 times a day.  If you are on a controlled diet, test once a day.  If your diabetes is not well controlled or if you are sick, you may need to monitor more often. HOW TO MONITOR YOUR BLOOD GLUCOSE Supplies Needed  Blood glucose meter.  Test strips for your meter. Each meter has its own strips. You must use the strips that go with your own meter.  A pricking needle (lancet).  A device that holds the lancet (lancing device).  A journal or log book to write down your results. Procedure  Wash your hands with soap and water. Alcohol is not preferred.  Prick the side of your finger (not the tip) with the lancet.  Gently milk the finger until a small drop of blood appears.  Follow the instructions that come with your meter for inserting the test strip, applying blood to the strip, and using your blood glucose meter. Other Areas to Get Blood for Testing Some meters allow you to use other areas of your body (other than your finger) to test your blood. These areas are called alternative sites. The most common alternative sites are:  The forearm.  The thigh.  The back area of the lower leg.  The palm of the hand. The blood flow in these areas is slower. Therefore, the blood glucose values you get may be delayed, and the numbers are different from what you would get from your  fingers. Do not use alternative sites if you think you are having hypoglycemia. Your reading will not be accurate. Always use a finger if you are having hypoglycemia. Also, if you cannot feel your lows (hypoglycemia unawareness), always use your fingers for your blood glucose checks. ADDITIONAL TIPS FOR GLUCOSE MONITORING  Do not reuse lancets.  Always carry your supplies with you.  All blood glucose meters have a 24-hour "hotline" number to call if you have questions or need help.  Adjust (calibrate) your blood glucose meter with a control solution after finishing a few boxes of strips. BLOOD  GLUCOSE RECORD KEEPING It is a good idea to keep a daily record or log of your blood glucose readings. Most glucose meters, if not all, keep your glucose records stored in the meter. Some meters come with the ability to download your records to your home computer. Keeping a record of your blood glucose readings is especially helpful if you are wanting to look for patterns. Make notes to go along with the blood glucose readings because you might forget what happened at that exact time. Keeping good records helps you and your health care provider to work together to achieve good diabetes management.  Document Released: 09/30/2003 Document Revised: 02/11/2014 Document Reviewed: 02/19/2013 [facility] Patient Information 2015 [place], [place]. This information is not intended to replace advice given to you by your health care provider. Make sure you discuss any questions you have with your health care provider.

## 2014-07-16 NOTE — ED Notes (Signed)
Pt CBG 461. RN notified.

## 2014-07-16 NOTE — ED Notes (Signed)
Pt. Reports high blood sugars today. Took it today at [facility] and it was 365. States started feeling like she was going to pass out this afternoon, rechecked CBG and it just read high. Called PCP and told to come here. On triage it was 532. Has not taken insulin in x2 years. States sugars are normally in the 500s.

## 2014-07-16 NOTE — ED Provider Notes (Signed)
CSN: [id number]     Arrival date & time 07/16/14  1457 History   First MD Initiated Contact with Patient 07/16/14 1604     Chief Complaint  Patient presents with  . Hyperglycemia     (Consider location/radiation/quality/duration/timing/severity/associated sxs/prior Treatment) HPI  Kathy Phelps is a 33 y.o. female with past medical history significant for bipolar, schizophrenia, hypertension, insulin-dependent diabetes complaining of acute onset of diffuse myalgia at 12:30 AM this morning. Patient's been noncompliant with her insulin for over 2 years. She took her blood sugar and it was over 500 shield her primary care doctor who instructed her to present to the ED. Patient was in her normal state of health yesterday, denies fever chills nausea vomiting trauma, chest pain, cough, shortness of breath, dysuria, hematuria on review of systems she endorses a urinary frequency.  Past Medical History  Diagnosis Date  . Hypertension   . Diabetes mellitus   . Hyperlipidemia   . Pregnancy complication     HELP Syndrome  . Complete miscarriage   . Bipolar affective   . Depression   . Schizophrenia    Past Surgical History  Procedure Laterality Date  . Cesarean section    . Cholecystectomy    . Tubal ligation     No family history on file. History  Substance Use Topics  . Smoking status: Current Every Day Smoker -- 0.50 packs/day    Types: Cigarettes  . Smokeless tobacco: Never Used  . Alcohol Use: No   OB History   Grav Para Term Preterm Abortions TAB SAB Ect Mult Living                 Review of Systems  10 systems reviewed and found to be negative, except as noted in the HPI.    Allergies  Hydrocodone-acetaminophen  Home Medications   Prior to Admission medications   Medication Sig Start Date End Date Taking? Authorizing Provider  ALPRAZolam Duanne Moron) 1 MG tablet Take 1 mg by mouth 3 (three) times daily.    Historical Provider, MD  cyclobenzaprine (FLEXERIL) 10 MG  tablet Take 1 tablet (10 mg total) by mouth 2 (two) times daily as needed for muscle spasms. 07/16/14   Leavy Heatherly, PA-C  HYDROcodone-acetaminophen (NORCO/VICODIN) 5-325 MG per tablet Take 2 tablets by mouth every 4 (four) hours as needed. 07/01/14   Viona Gilmore Cartner, PA-C  ibuprofen (ADVIL,MOTRIN) 800 MG tablet Take 1 tablet (800 mg total) by mouth 3 (three) times daily. 07/13/14   Domenic Moras, PA-C  lithium carbonate (ESKALITH) 450 MG CR tablet Take 450 mg by mouth 2 (two) times daily.    Historical Provider, MD  metFORMIN (GLUCOPHAGE) 1000 MG tablet Take 1 tablet (1,000 mg total) by mouth 2 (two) times daily. 07/16/14   Giannamarie Paulus, PA-C  metFORMIN (GLUCOPHAGE) 500 MG tablet Take 1,000 mg by mouth 2 (two) times daily.    Historical Provider, MD  naproxen (NAPROSYN) 500 MG tablet Take 1 tablet (500 mg total) by mouth 2 (two) times daily. 07/01/14   Viona Gilmore Cartner, PA-C  penicillin v potassium (VEETID) 500 MG tablet Take 1 tablet (500 mg total) by mouth 3 (three) times daily. 07/13/14   Domenic Moras, PA-C  QUEtiapine (SEROQUEL) 400 MG tablet Take 400 mg by mouth at bedtime.    Historical Provider, MD   BP 130/87  Pulse 86  Temp(Src) 98.2 F (36.8 C) (Oral)  Resp 18  SpO2 98%  LMP 06/30/2014 Physical Exam  Nursing note and vitals  reviewed. Constitutional: She appears well-developed and well-nourished. No distress.  HENT:  Head: Normocephalic and atraumatic.  Mouth/Throat: Oropharynx is clear and moist.  Eyes: Conjunctivae and EOM are normal. Pupils are equal, round, and reactive to light.  Neck: Normal range of motion. Neck supple.  No midline C-spine  tenderness to palpation or step-offs appreciated. Patient has full range of motion without pain.   Cardiovascular: Normal rate, regular rhythm and intact distal pulses.   Pulmonary/Chest: Effort normal and breath sounds normal. No stridor. No respiratory distress. She has no wheezes. She has no rales. She exhibits no tenderness.   Abdominal: Soft. Bowel sounds are normal. She exhibits no distension and no mass. There is no tenderness. There is no rebound and no guarding.  Musculoskeletal: Normal range of motion.  Neurological:  Somnolent but arousable to voice. Patient is stuttering, she is not slurring her words. Neuro exam shows a generalized weakness with strength being 2/5 on all 4 extremities, nonfocal exam.   Psychiatric: She has a normal mood and affect.    ED Course  Procedures (including critical care time) Labs Review Labs Reviewed  CBC - Abnormal; Notable for the following:    WBC 12.6 (*)    All other components within normal limits  COMPREHENSIVE METABOLIC PANEL - Abnormal; Notable for the following:    Sodium 132 (*)    Chloride 95 (*)    Glucose, Bld 524 (*)    Albumin 3.1 (*)    GFR calc non Af Amer 84 (*)    All other components within normal limits  URINALYSIS, ROUTINE W REFLEX MICROSCOPIC - Abnormal; Notable for the following:    Specific Gravity, Urine 1.035 (*)    Glucose, UA >1000 (*)    Nitrite POSITIVE (*)    All other components within normal limits  URINE MICROSCOPIC-ADD ON - Abnormal; Notable for the following:    Squamous Epithelial / LPF FEW (*)    Bacteria, UA MANY (*)    All other components within normal limits  URINE RAPID DRUG SCREEN (HOSP PERFORMED) - Abnormal; Notable for the following:    Benzodiazepines POSITIVE (*)    All other components within normal limits  CBG MONITORING, ED - Abnormal; Notable for the following:    Glucose-Capillary 532 (*)    All other components within normal limits  CBG MONITORING, ED - Abnormal; Notable for the following:    Glucose-Capillary 461 (*)    All other components within normal limits  CBG MONITORING, ED - Abnormal; Notable for the following:    Glucose-Capillary 334 (*)    All other components within normal limits  CBG MONITORING, ED - Abnormal; Notable for the following:    Glucose-Capillary 168 (*)    All other  components within normal limits  URINE CULTURE  ETHANOL  AMMONIA  POC URINE PREG, ED  I-STAT CG4 LACTIC ACID, ED    Imaging Review Dg Chest 2 View  07/16/2014   CLINICAL DATA:  Hyperglycemia.  Hypertension.  Initial evaluation.  EXAM: CHEST  2 VIEW  COMPARISON:  Thoracic spine series 06/30/2014.  FINDINGS: Mediastinum and hilar structures are normal. Stable elevation left hemidiaphragm. No pleural effusion or pneumothorax. Surgical clips right upper quadrant. Degenerative changes thoracic spine.  IMPRESSION: No active cardiopulmonary disease.   Electronically Signed   By: Marcello Moores  Register   On: 07/16/2014 17:28   Ct Head Wo Contrast  07/16/2014   CLINICAL DATA:  Initial encounter for hyperglycemia and presyncopal sensation.  EXAM: CT HEAD WITHOUT  CONTRAST  TECHNIQUE: Contiguous axial images were obtained from the base of the skull through the vertex without intravenous contrast.  COMPARISON:  None.  FINDINGS: No acute cortical infarct, hemorrhage, or mass lesion is present. The ventricles are of normal size. No significant extra-axial fluid collection is evident. The paranasal sinuses and mastoid air cells are clear. The osseous skull is intact.  IMPRESSION: Negative CT of the head.   Electronically Signed   By: Lawrence Santiago M.D.   On: 07/16/2014 19:43     EKG Interpretation   Date/Time:  Tuesday July 16 2014 19:24:29 EDT Ventricular Rate:  85 PR Interval:  152 QRS Duration: 85 QT Interval:  359 QTC Calculation: 427 R Axis:   62 Text Interpretation:  Sinus rhythm Ventricular premature complex Low  voltage, precordial leads No significant change since last tracing  Confirmed by YAO  MD, DAVID ([id number]) on 07/16/2014 8:00:51 PM      MDM   Final diagnoses:  Hyperglycemia without ketosis  Syncope, unspecified syncope type  H/O medication noncompliance    Filed Vitals:   07/16/14 1915 07/16/14 1930 07/16/14 1945 07/16/14 1954  BP: 128/79 127/71 130/87 130/87  Pulse: 90 87 84  86  Temp:      TempSrc:      Resp:  12 20 18   SpO2: 100% 97% 99% 98%    Medications  sodium chloride 0.9 % bolus 1,000 mL (0 mLs Intravenous Stopped 07/16/14 1730)  insulin aspart (novoLOG) injection 10 Units (10 Units Subcutaneous Given 07/16/14 1737)  fentaNYL (SUBLIMAZE) injection 50 mcg (50 mcg Intravenous Given 07/16/14 1737)  sodium chloride 0.9 % bolus 1,000 mL (0 mLs Intravenous Stopped 07/16/14 1913)  cyclobenzaprine (FLEXERIL) tablet 10 mg (10 mg Oral Given 07/16/14 2018)    Kathy Phelps is a 33 y.o. female presenting with hyperglycemia without ketosis, new onset stuttering, somnolence. She has been noncompliant with her insulin for over 2 years. Neuro exam is nonfocal. Patient states the onset of these neurologic symptoms were about 12:30 AM. She is outside the window for CVA which I doubt this is a. Attending physician notified immediately examined the patient and agrees with nonfocal neuro exam. Plan is to treat her hyperglycemia and initiate a CVA and altered mental status workup. Patient's blood sugar is elevated over 500 however she has no an anion gap. Patient has been without her insulin for over 2 years. I do not think this is an acute issue. Blood work shows leukocytosis of 12.2 and sodium corrected for glucose of 139.   Patient's mental status significantly improved after pain medication and fluid bolus. States that now that she is more alert she remembers that she syncopized while walking into the ED. She denies any head trauma but states she has pain all over. Requesting more pain medication. EKG ordered to rule out arrhythmia. Head CT and chest x-ray with no abnormality. Patient tolerating by mouth and ambulates with a steady gait, amenable to discharge. I am not comfortable writing her prescription for insulin, I will start her on metformin.  This is a shared visit with the attending physician who personally evaluated the patient and agrees with the care plan.     Evaluation does not show pathology that would require ongoing emergent intervention or inpatient treatment. Pt is hemodynamically stable and mentating appropriately. Discussed findings and plan with patient/guardian, who agrees with care plan. All questions answered. Return precautions discussed and outpatient follow up given.   Discharge Medication List as of 07/16/2014  8:05 PM    START taking these medications   Details  cyclobenzaprine (FLEXERIL) 10 MG tablet Take 1 tablet (10 mg total) by mouth 2 (two) times daily as needed for muscle spasms., Starting 07/16/2014, Until Discontinued, Print    !! metFORMIN (GLUCOPHAGE) 1000 MG tablet Take 1 tablet (1,000 mg total) by mouth 2 (two) times daily., Starting 07/16/2014, Until Discontinued, Print     !! - Potential duplicate medications found. Please discuss with provider.           Monico Blitz, PA-C 07/17/14 0008

## 2014-07-18 NOTE — ED Provider Notes (Signed)
Medical screening examination/treatment/procedure(s) were conducted as a shared visit with non-physician practitioner(s) and myself.  I personally evaluated the patient during the encounter.   EKG Interpretation   Date/Time:  Tuesday July 16 2014 19:24:29 EDT Ventricular Rate:  85 PR Interval:  152 QRS Duration: 85 QT Interval:  359 QTC Calculation: 427 R Axis:   62 Text Interpretation:  Sinus rhythm Ventricular premature complex Low  voltage, precordial leads No significant change since last tracing  Confirmed by Ange Puskas  MD, Sunaina Ferrando ([id number]) on 07/16/2014 8:00:51 PM      Kathy Phelps is a 33 y.o. female hx of DM off meds, here with hyperglycemia, AMS. Uncompliant with meds for 2 days, came for hyperglycemia. Was appropriate in triage, but when PA saw her, she became to have some trouble speaking. She states that the symptoms have been going on since 12:30 AM. Nonfocal neuro exam. Patient had normal speech in triage. Heart, lung, abdomen unremarkable. CBG 500, labs showed no AG. AMS work up initiated. No signs of infection, CT head unremarkable. She then was more awake with IVF and subq insulin and state that she may have passed out. EKG unremarkable, trop neg. After IVF, CBG improved. Will restart metformin as she isn't sure what dose of insulin she takes.   Wandra Arthurs, MD 07/18/14 [phone]

## 2014-07-19 LAB — URINE CULTURE: Colony Count: >=100000

## 2014-07-22 ENCOUNTER — Telehealth (HOSPITAL_BASED_OUTPATIENT_CLINIC_OR_DEPARTMENT_OTHER): Payer: Self-pay

## 2014-07-22 NOTE — Telephone Encounter (Signed)
Post ED Visit - Positive Culture Follow-up  Culture report reviewed by antimicrobial stewardship pharmacist: []  Wes Dulaney, Pharm.D., BCPS []  Heide Guile, Pharm.D., BCPS []  Alycia Rossetti, Pharm.D., BCPS []  [place], Pharm.D., BCPS, AAHIVP [x]  Legrand Como, Pharm.D., BCPS, AAHIVP []  Carly Sabat, Pharm.D. []  Elenor Quinones, Pharm.D.  Positive Urine culture Treated with Amoxicillin, organism sensitive to the same and no further patient follow-up is required at this time.  Dortha Kern 07/22/2014, 5:14 AM

## 2014-07-30 ENCOUNTER — Encounter: Payer: Self-pay | Admitting: Internal Medicine

## 2014-08-07 ENCOUNTER — Encounter (HOSPITAL_COMMUNITY): Payer: Self-pay | Admitting: Emergency Medicine

## 2014-08-07 ENCOUNTER — Emergency Department (HOSPITAL_COMMUNITY)
Admission: EM | Admit: 2014-08-07 | Discharge: 2014-08-08 | Disposition: A | Discharge status: Home / Self Care | Payer: Medicaid Other | Attending: Emergency Medicine | Admitting: Emergency Medicine

## 2014-08-07 DIAGNOSIS — Z79899 Other long term (current) drug therapy: Secondary | ICD-10-CM | POA: Diagnosis not present

## 2014-08-07 DIAGNOSIS — Z791 Long term (current) use of non-steroidal anti-inflammatories (NSAID): Secondary | ICD-10-CM | POA: Insufficient documentation

## 2014-08-07 DIAGNOSIS — Z720 Tobacco use: Secondary | ICD-10-CM | POA: Diagnosis not present

## 2014-08-07 DIAGNOSIS — E1165 Type 2 diabetes mellitus with hyperglycemia: Secondary | ICD-10-CM | POA: Diagnosis present

## 2014-08-07 DIAGNOSIS — R739 Hyperglycemia, unspecified: Secondary | ICD-10-CM

## 2014-08-07 DIAGNOSIS — F209 Schizophrenia, unspecified: Secondary | ICD-10-CM | POA: Insufficient documentation

## 2014-08-07 DIAGNOSIS — Z792 Long term (current) use of antibiotics: Secondary | ICD-10-CM | POA: Insufficient documentation

## 2014-08-07 DIAGNOSIS — I10 Essential (primary) hypertension: Secondary | ICD-10-CM | POA: Diagnosis not present

## 2014-08-07 DIAGNOSIS — F319 Bipolar disorder, unspecified: Secondary | ICD-10-CM | POA: Insufficient documentation

## 2014-08-07 LAB — URINALYSIS, ROUTINE W REFLEX MICROSCOPIC
BILIRUBIN URINE: NEGATIVE
Glucose, UA: >1000 mg/dL — AB
Hgb urine dipstick: NEGATIVE
Ketones, ur: NEGATIVE mg/dL
LEUKOCYTES UA: NEGATIVE
Nitrite: NEGATIVE
PH: 5.5 (ref 5.0–8.0)
Protein, ur: NEGATIVE mg/dL
Specific Gravity, Urine: 1.038 — ABNORMAL HIGH (ref 1.005–1.030)
Urobilinogen, UA: 1 mg/dL (ref 0.0–1.0)

## 2014-08-07 LAB — COMPREHENSIVE METABOLIC PANEL
ALT: 8 U/L (ref 0–35)
AST: 20 U/L (ref 0–37)
Albumin: 3 g/dL — ABNORMAL LOW (ref 3.5–5.2)
Alkaline Phosphatase: 72 U/L (ref 39–117)
Anion gap: 15 (ref 5–15)
BUN: 12 mg/dL (ref 6–23)
CHLORIDE: 96 meq/L (ref 96–112)
CO2: 21 meq/L (ref 19–32)
Calcium: 8.7 mg/dL (ref 8.4–10.5)
Creatinine, Ser: 0.89 mg/dL (ref 0.50–1.10)
GFR calc Af Amer: >90 mL/min (ref >90)
GFR, EST NON AFRICAN AMERICAN: 83 mL/min — AB (ref >90)
Glucose, Bld: 543 mg/dL — ABNORMAL HIGH (ref 70–99)
Potassium: 4.3 mEq/L (ref 3.7–5.3)
SODIUM: 132 meq/L — AB (ref 137–147)
Total Bilirubin: <0.2 mg/dL — ABNORMAL LOW (ref 0.3–1.2)
Total Protein: 7 g/dL (ref 6.0–8.3)

## 2014-08-07 LAB — URINE MICROSCOPIC-ADD ON

## 2014-08-07 LAB — CBG MONITORING, ED
Glucose-Capillary: 387 mg/dL — ABNORMAL HIGH (ref 70–99)
Glucose-Capillary: 391 mg/dL — ABNORMAL HIGH (ref 70–99)
Glucose-Capillary: 510 mg/dL — ABNORMAL HIGH (ref 70–99)

## 2014-08-07 LAB — CBC WITH DIFFERENTIAL/PLATELET
Basophils Absolute: 0 10*3/uL (ref 0.0–0.1)
Basophils Relative: 1 % (ref 0–1)
EOS PCT: 7 % — AB (ref 0–5)
Eosinophils Absolute: 0.5 10*3/uL (ref 0.0–0.7)
HCT: 40.5 % (ref 36.0–46.0)
Hemoglobin: 14.1 g/dL (ref 12.0–15.0)
LYMPHS ABS: 2.8 10*3/uL (ref 0.7–4.0)
LYMPHS PCT: 42 % (ref 12–46)
MCH: 31.1 pg (ref 26.0–34.0)
MCHC: 34.8 g/dL (ref 30.0–36.0)
MCV: 89.2 fL (ref 78.0–100.0)
Monocytes Absolute: 0.7 10*3/uL (ref 0.1–1.0)
Monocytes Relative: 11 % (ref 3–12)
NEUTROS ABS: 2.5 10*3/uL (ref 1.7–7.7)
Neutrophils Relative %: 39 % — ABNORMAL LOW (ref 43–77)
PLATELETS: 142 10*3/uL — AB (ref 150–400)
RBC: 4.54 MIL/uL (ref 3.87–5.11)
RDW: 12.5 % (ref 11.5–15.5)
WBC: 6.5 10*3/uL (ref 4.0–10.5)

## 2014-08-07 MED ORDER — LORAZEPAM 2 MG/ML IJ SOLN
INTRAMUSCULAR | Status: AC
  Filled 2014-08-07: qty 1

## 2014-08-07 MED ORDER — KETOROLAC TROMETHAMINE 30 MG/ML IJ SOLN
30.0000 mg | Freq: Once | INTRAMUSCULAR | Status: AC
Start: 2014-08-07 — End: 2014-08-07
  Administered 2014-08-07: 30 mg via INTRAVENOUS
  Filled 2014-08-07: qty 1

## 2014-08-07 MED ORDER — INSULIN ASPART 100 UNIT/ML ~~LOC~~ SOLN
10.0000 [IU] | Freq: Once | SUBCUTANEOUS | Status: AC
  Administered 2014-08-07: 10 [IU] via SUBCUTANEOUS
  Filled 2014-08-07: qty 1

## 2014-08-07 MED ORDER — MORPHINE SULFATE 4 MG/ML IJ SOLN
2.0000 mg | Freq: Once | INTRAMUSCULAR | Status: AC
  Administered 2014-08-07: 2 mg via INTRAVENOUS
  Filled 2014-08-07: qty 1

## 2014-08-07 MED ORDER — LORAZEPAM 2 MG/ML IJ SOLN
INTRAMUSCULAR | Status: DC
Start: 2014-08-07 — End: 2014-08-08
  Filled 2014-08-07: qty 1

## 2014-08-07 MED ORDER — SODIUM CHLORIDE 0.9 % IV BOLUS (SEPSIS)
1000.0000 mL | Freq: Once | INTRAVENOUS | Status: AC
  Administered 2014-08-07: 1000 mL via INTRAVENOUS

## 2014-08-07 MED ORDER — LORAZEPAM 2 MG/ML IJ SOLN
1.0000 mg | Freq: Once | INTRAMUSCULAR | Status: AC
  Administered 2014-08-07: 1 mg via INTRAVENOUS

## 2014-08-07 NOTE — ED Notes (Signed)
CBG: 391 

## 2014-08-07 NOTE — ED Notes (Signed)
Pt ambulated into department, pt shaking but alert, reports to staff my CBG is 500. Pt placed in wheelchair, pt unresponsive when placed in room, pt lifted out of wheelchair from staff and placed into bed, CBG 510 upon arrival, pt shaking.

## 2014-08-07 NOTE — ED Notes (Signed)
PA at BS.  

## 2014-08-07 NOTE — ED Notes (Signed)
Pt jerking and not responding to this RN.

## 2014-08-07 NOTE — ED Provider Notes (Signed)
CSN: [id number]     Arrival date & time 08/07/14  2026 History   First MD Initiated Contact with Patient 08/07/14 2050     Chief Complaint  Patient presents with  . Hyperglycemia     (Consider location/radiation/quality/duration/timing/severity/associated sxs/prior Treatment) HPI Comments: Patient with poorly controlled DM, bipolar, panic disorder, schizophrenia, and HTN presents to the emergency department with chief complaint of hyperglycemia and altered mental status. She was initially ambulatory in the emergency department, but then began shaking. This initially was placed in the wheelchair, she reportedly became unresponsive and began shaking. Patient was given 1 mg of Ativan and immediately asked where she was and why she was here.  After ativan, patient complains of generalized body aches.  Denies any recent fever, cough, nausea, vomiting, or diarrhea.  She states that she has been trying to take her new diabetes medication (lantus) as prescribed.  There are no aggravating or alleviating factors.  The history is provided by the patient. No language interpreter was used.    Past Medical History  Diagnosis Date  . Hypertension   . Diabetes mellitus   . Hyperlipidemia   . Pregnancy complication     HELP Syndrome  . Complete miscarriage   . Bipolar affective   . Depression   . Schizophrenia    Past Surgical History  Procedure Laterality Date  . Cesarean section    . Cholecystectomy    . Tubal ligation     No family history on file. History  Substance Use Topics  . Smoking status: Current Every Day Smoker -- 0.50 packs/day    Types: Cigarettes  . Smokeless tobacco: Never Used  . Alcohol Use: No   OB History   Grav Para Term Preterm Abortions TAB SAB Ect Mult Living                 Review of Systems  Constitutional: Negative for fever and chills.  Respiratory: Negative for shortness of breath.   Cardiovascular: Negative for chest pain.  Gastrointestinal: Negative  for nausea, vomiting, diarrhea and constipation.  Genitourinary: Negative for dysuria.  All other systems reviewed and are negative.     Allergies  Hydrocodone-acetaminophen  Home Medications   Prior to Admission medications   Medication Sig Start Date End Date Taking? Authorizing Provider  ALPRAZolam Duanne Moron) 1 MG tablet Take 1 mg by mouth 3 (three) times daily.    Historical Provider, MD  cyclobenzaprine (FLEXERIL) 10 MG tablet Take 1 tablet (10 mg total) by mouth 2 (two) times daily as needed for muscle spasms. 07/16/14   Nicole Pisciotta, PA-C  HYDROcodone-acetaminophen (NORCO/VICODIN) 5-325 MG per tablet Take 2 tablets by mouth every 4 (four) hours as needed. 07/01/14   Viona Gilmore Cartner, PA-C  ibuprofen (ADVIL,MOTRIN) 800 MG tablet Take 1 tablet (800 mg total) by mouth 3 (three) times daily. 07/13/14   Domenic Moras, PA-C  lithium carbonate (ESKALITH) 450 MG CR tablet Take 450 mg by mouth 2 (two) times daily.    Historical Provider, MD  metFORMIN (GLUCOPHAGE) 1000 MG tablet Take 1 tablet (1,000 mg total) by mouth 2 (two) times daily. 07/16/14   Nicole Pisciotta, PA-C  metFORMIN (GLUCOPHAGE) 500 MG tablet Take 1,000 mg by mouth 2 (two) times daily.    Historical Provider, MD  naproxen (NAPROSYN) 500 MG tablet Take 1 tablet (500 mg total) by mouth 2 (two) times daily. 07/01/14   Verl Dicker, PA-C  penicillin v potassium (VEETID) 500 MG tablet Take 1 tablet (500 mg total)  by mouth 3 (three) times daily. 07/13/14   Domenic Moras, PA-C  QUEtiapine (SEROQUEL) 400 MG tablet Take 400 mg by mouth at bedtime.    Historical Provider, MD   BP 146/96  Pulse 93  Temp(Src) 97.8 F (36.6 C) (Oral)  Resp 22  SpO2 100% Physical Exam  Nursing note and vitals reviewed. Constitutional: She is oriented to person, place, and time. She appears well-developed and well-nourished.  HENT:  Head: Normocephalic and atraumatic.  Eyes: Conjunctivae and EOM are normal. Pupils are equal, round, and reactive to  light.  Neck: Normal range of motion. Neck supple.  Cardiovascular: Normal rate and regular rhythm.  Exam reveals no gallop and no friction rub.   No murmur heard. Pulmonary/Chest: Effort normal and breath sounds normal. No respiratory distress. She has no wheezes. She has no rales. She exhibits no tenderness.  Abdominal: Soft. Bowel sounds are normal. She exhibits no distension and no mass. There is no tenderness. There is no rebound and no guarding.  Musculoskeletal: Normal range of motion. She exhibits no edema and no tenderness.  Neurological: She is alert and oriented to person, place, and time.  Skin: Skin is warm and dry.  Psychiatric: She has a normal mood and affect. Her behavior is normal. Judgment and thought content normal.    ED Course  Procedures (including critical care time) Results for orders placed during the hospital encounter of 08/07/14  CBC WITH DIFFERENTIAL      Result Value Ref Range   WBC 6.5  4.0 - 10.5 K/uL   RBC 4.54  3.87 - 5.11 MIL/uL   Hemoglobin 14.1  12.0 - 15.0 g/dL   HCT 40.5  36.0 - 46.0 %   MCV 89.2  78.0 - 100.0 fL   MCH 31.1  26.0 - 34.0 pg   MCHC 34.8  30.0 - 36.0 g/dL   RDW 12.5  11.5 - 15.5 %   Platelets 142 (*) 150 - 400 K/uL   Neutrophils Relative % 39 (*) 43 - 77 %   Neutro Abs 2.5  1.7 - 7.7 K/uL   Lymphocytes Relative 42  12 - 46 %   Lymphs Abs 2.8  0.7 - 4.0 K/uL   Monocytes Relative 11  3 - 12 %   Monocytes Absolute 0.7  0.1 - 1.0 K/uL   Eosinophils Relative 7 (*) 0 - 5 %   Eosinophils Absolute 0.5  0.0 - 0.7 K/uL   Basophils Relative 1  0 - 1 %   Basophils Absolute 0.0  0.0 - 0.1 K/uL  COMPREHENSIVE METABOLIC PANEL      Result Value Ref Range   Sodium 132 (*) 137 - 147 mEq/L   Potassium 4.3  3.7 - 5.3 mEq/L   Chloride 96  96 - 112 mEq/L   CO2 21  19 - 32 mEq/L   Glucose, Bld 543 (*) 70 - 99 mg/dL   BUN 12  6 - 23 mg/dL   Creatinine, Ser 0.89  0.50 - 1.10 mg/dL   Calcium 8.7  8.4 - 10.5 mg/dL   Total Protein 7.0  6.0 - 8.3  g/dL   Albumin 3.0 (*) 3.5 - 5.2 g/dL   AST 20  0 - 37 U/L   ALT 8  0 - 35 U/L   Alkaline Phosphatase 72  39 - 117 U/L   Total Bilirubin <0.2 (*) 0.3 - 1.2 mg/dL   GFR calc non Af Amer 83 (*) >90 mL/min   GFR calc Af Amer >  90  >90 mL/min   Anion gap 15  5 - 15  URINALYSIS, ROUTINE W REFLEX MICROSCOPIC      Result Value Ref Range   Color, Urine YELLOW  YELLOW   APPearance CLEAR  CLEAR   Specific Gravity, Urine 1.038 (*) 1.005 - 1.030   pH 5.5  5.0 - 8.0   Glucose, UA >1000 (*) NEGATIVE mg/dL   Hgb urine dipstick NEGATIVE  NEGATIVE   Bilirubin Urine NEGATIVE  NEGATIVE   Ketones, ur NEGATIVE  NEGATIVE mg/dL   Protein, ur NEGATIVE  NEGATIVE mg/dL   Urobilinogen, UA 1.0  0.0 - 1.0 mg/dL   Nitrite NEGATIVE  NEGATIVE   Leukocytes, UA NEGATIVE  NEGATIVE  URINE MICROSCOPIC-ADD ON      Result Value Ref Range   Squamous Epithelial / LPF FEW (*) RARE   WBC, UA 0-2  <3 WBC/hpf   RBC / HPF 0-2  <3 RBC/hpf   Urine-Other FEW YEAST    CBG MONITORING, ED      Result Value Ref Range   Glucose-Capillary 510 (*) 70 - 99 mg/dL   Comment 1 Notify RN    CBG MONITORING, ED      Result Value Ref Range   Glucose-Capillary 391 (*) 70 - 99 mg/dL  CBG MONITORING, ED      Result Value Ref Range   Glucose-Capillary 387 (*) 70 - 99 mg/dL   Dg Chest 2 View  07/16/2014   CLINICAL DATA:  Hyperglycemia.  Hypertension.  Initial evaluation.  EXAM: CHEST  2 VIEW  COMPARISON:  Thoracic spine series 06/30/2014.  FINDINGS: Mediastinum and hilar structures are normal. Stable elevation left hemidiaphragm. No pleural effusion or pneumothorax. Surgical clips right upper quadrant. Degenerative changes thoracic spine.  IMPRESSION: No active cardiopulmonary disease.   Electronically Signed   By: Marcello Moores  Register   On: 07/16/2014 17:28   Ct Head Wo Contrast  07/16/2014   CLINICAL DATA:  Initial encounter for hyperglycemia and presyncopal sensation.  EXAM: CT HEAD WITHOUT CONTRAST  TECHNIQUE: Contiguous axial images were  obtained from the base of the skull through the vertex without intravenous contrast.  COMPARISON:  None.  FINDINGS: No acute cortical infarct, hemorrhage, or mass lesion is present. The ventricles are of normal size. No significant extra-axial fluid collection is evident. The paranasal sinuses and mastoid air cells are clear. The osseous skull is intact.  IMPRESSION: Negative CT of the head.   Electronically Signed   By: Lawrence Santiago M.D.   On: 07/16/2014 19:43      EKG Interpretation None      MDM   Final diagnoses:  Hyperglycemia    Patient originally shaking on exam, but after 1mg  of ativan was given, the patient immediately perked up and asked where she was and why she was here.  Doubt true seizure.  No post-ictal phase.  Patient found to be hyperglycemic to 510.  Will check labs and reassess.  Patient had recurrence of seizure-like activity, however patient is communicating with me while shaking. This is not true seizure.  Patient requesting pain medication because she hurts all over. We'll treat with a dose of morphine, and will reassess.  10:30 PM Patient states that she is still still having pain all over. She is requesting additional pain medication. I will give a dose of Toradol. She has had 2 L of fluid, CBG is now 391. In reviewing prior notes, she has had a similar presentation this month, in which she repeatedly asked for  pain medicine. I'm suspicious that she is seeking. I do not find any other emergent indications for additional workup at this time. We'll continue to control blood sugar. On prior workup, she had CT scan of her head which was negative. Will stress medication compliance, and plan for discharge to home.  12:08 AM Patient is feeling much better. She states that she is ready to go home. She has follow-up with [facility] on November 10. She asked for a small prescription of pain medicine for her chronic pain. She is stable and ready for  discharge.    Montine Circle, PA-C 08/08/14 [phone]

## 2014-08-07 NOTE — ED Notes (Signed)
Rob, [place] aware of CBG

## 2014-08-08 MED ORDER — OXYCODONE-ACETAMINOPHEN 5-325 MG PO TABS: 1.0000 | ORAL_TABLET | Freq: Three times a day (TID) | ORAL | Status: DC | PRN

## 2014-08-08 NOTE — Discharge Instructions (Signed)

## 2014-08-08 NOTE — ED Provider Notes (Signed)
Medical screening examination/treatment/procedure(s) were performed by non-physician practitioner and as supervising physician I was immediately available for consultation/collaboration.   EKG Interpretation   Date/Time:  Wednesday August 07 2014 20:54:40 EDT Ventricular Rate:  114 PR Interval:  182 QRS Duration: 104 QT Interval:  343 QTC Calculation: 472 R Axis:   10 Text Interpretation:  Sinus tachycardia Ventricular trigeminy Low voltage,  extremity and precordial leads Probable anteroseptal infarct, old  Borderline ST depression, inferior leads Borderline ST elevation, lateral  leads new Baseline wander in lead(s) I III aVL V6 Confirmed by Maryan Rued   MD, Gailya Tauer ([id number]) on 08/07/2014 10:42:47 PM        Blanchie Dessert, MD 08/08/14 [id number]

## 2014-08-08 NOTE — ED Notes (Signed)
Rob PA at bedside 

## 2015-02-26 ENCOUNTER — Emergency Department (HOSPITAL_COMMUNITY): Payer: Medicaid Other

## 2015-02-26 ENCOUNTER — Encounter (HOSPITAL_COMMUNITY): Payer: Self-pay

## 2015-02-26 ENCOUNTER — Emergency Department (HOSPITAL_COMMUNITY)
Admission: EM | Admit: 2015-02-26 | Discharge: 2015-02-26 | Disposition: A | Discharge status: Home / Self Care | Payer: Medicaid Other | Attending: Emergency Medicine | Admitting: Emergency Medicine

## 2015-02-26 DIAGNOSIS — E1165 Type 2 diabetes mellitus with hyperglycemia: Secondary | ICD-10-CM | POA: Insufficient documentation

## 2015-02-26 DIAGNOSIS — R0789 Other chest pain: Secondary | ICD-10-CM | POA: Diagnosis not present

## 2015-02-26 DIAGNOSIS — Z79899 Other long term (current) drug therapy: Secondary | ICD-10-CM | POA: Diagnosis not present

## 2015-02-26 DIAGNOSIS — Z720 Tobacco use: Secondary | ICD-10-CM | POA: Insufficient documentation

## 2015-02-26 DIAGNOSIS — N390 Urinary tract infection, site not specified: Secondary | ICD-10-CM | POA: Diagnosis not present

## 2015-02-26 DIAGNOSIS — Z794 Long term (current) use of insulin: Secondary | ICD-10-CM | POA: Insufficient documentation

## 2015-02-26 DIAGNOSIS — Z792 Long term (current) use of antibiotics: Secondary | ICD-10-CM | POA: Insufficient documentation

## 2015-02-26 DIAGNOSIS — I10 Essential (primary) hypertension: Secondary | ICD-10-CM | POA: Insufficient documentation

## 2015-02-26 DIAGNOSIS — R339 Retention of urine, unspecified: Secondary | ICD-10-CM

## 2015-02-26 DIAGNOSIS — Z791 Long term (current) use of non-steroidal anti-inflammatories (NSAID): Secondary | ICD-10-CM | POA: Insufficient documentation

## 2015-02-26 DIAGNOSIS — R079 Chest pain, unspecified: Secondary | ICD-10-CM

## 2015-02-26 DIAGNOSIS — F209 Schizophrenia, unspecified: Secondary | ICD-10-CM | POA: Insufficient documentation

## 2015-02-26 DIAGNOSIS — R0602 Shortness of breath: Secondary | ICD-10-CM | POA: Diagnosis not present

## 2015-02-26 DIAGNOSIS — Z3202 Encounter for pregnancy test, result negative: Secondary | ICD-10-CM | POA: Diagnosis not present

## 2015-02-26 DIAGNOSIS — R109 Unspecified abdominal pain: Secondary | ICD-10-CM

## 2015-02-26 DIAGNOSIS — F319 Bipolar disorder, unspecified: Secondary | ICD-10-CM | POA: Diagnosis not present

## 2015-02-26 LAB — COMPREHENSIVE METABOLIC PANEL
AST: 12 U/L — ABNORMAL LOW (ref 15–41)
Albumin: 3.2 g/dL — ABNORMAL LOW (ref 3.5–5.0)
Alkaline Phosphatase: 56 U/L (ref 38–126)
Anion gap: 11 (ref 5–15)
BUN: 8 mg/dL (ref 6–20)
CO2: 25 mmol/L (ref 22–32)
CREATININE: 0.92 mg/dL (ref 0.44–1.00)
Calcium: 8.4 mg/dL — ABNORMAL LOW (ref 8.9–10.3)
Chloride: 102 mmol/L (ref 101–111)
Glucose, Bld: 477 mg/dL — ABNORMAL HIGH (ref 65–99)
Potassium: 3.5 mmol/L (ref 3.5–5.1)
SODIUM: 138 mmol/L (ref 135–145)
TOTAL PROTEIN: 6.3 g/dL — AB (ref 6.5–8.1)
Total Bilirubin: 0.4 mg/dL (ref 0.3–1.2)

## 2015-02-26 LAB — PHOSPHORUS: PHOSPHORUS: 3.5 mg/dL (ref 2.5–4.6)

## 2015-02-26 LAB — CBC WITH DIFFERENTIAL/PLATELET
BASOS PCT: 0 % (ref 0–1)
Basophils Absolute: 0 10*3/uL (ref 0.0–0.1)
EOS ABS: 0.3 10*3/uL (ref 0.0–0.7)
Eosinophils Relative: 3 % (ref 0–5)
HCT: 37.6 % (ref 36.0–46.0)
Hemoglobin: 12.9 g/dL (ref 12.0–15.0)
Lymphocytes Relative: 30 % (ref 12–46)
Lymphs Abs: 2.5 10*3/uL (ref 0.7–4.0)
MCH: 31 pg (ref 26.0–34.0)
MCHC: 34.3 g/dL (ref 30.0–36.0)
MCV: 90.4 fL (ref 78.0–100.0)
Monocytes Absolute: 0.4 10*3/uL (ref 0.1–1.0)
Monocytes Relative: 5 % (ref 3–12)
Neutro Abs: 5.3 10*3/uL (ref 1.7–7.7)
Neutrophils Relative %: 62 % (ref 43–77)
PLATELETS: 183 10*3/uL (ref 150–400)
RBC: 4.16 MIL/uL (ref 3.87–5.11)
RDW: 12 % (ref 11.5–15.5)
WBC: 8.5 10*3/uL (ref 4.0–10.5)

## 2015-02-26 LAB — URINALYSIS, ROUTINE W REFLEX MICROSCOPIC
Bilirubin Urine: NEGATIVE
Hgb urine dipstick: NEGATIVE
KETONES UR: NEGATIVE mg/dL
Leukocytes, UA: NEGATIVE
Nitrite: POSITIVE — AB
PH: 5.5 (ref 5.0–8.0)
Protein, ur: NEGATIVE mg/dL
Specific Gravity, Urine: 1.038 — ABNORMAL HIGH (ref 1.005–1.030)
Urobilinogen, UA: 1 mg/dL (ref 0.0–1.0)

## 2015-02-26 LAB — URINE MICROSCOPIC-ADD ON

## 2015-02-26 LAB — BLOOD GAS, VENOUS
Acid-Base Excess: 0.1 mmol/L (ref 0.0–2.0)
BICARBONATE: 24.2 meq/L — AB (ref 20.0–24.0)
O2 Saturation: 87.4 %
PATIENT TEMPERATURE: 98.6
PH VEN: 7.404 — AB (ref 7.250–7.300)
PO2 VEN: 52.8 mmHg — AB (ref 30.0–45.0)
TCO2: 21.4 mmol/L (ref 0–100)
pCO2, Ven: 39.5 mmHg — ABNORMAL LOW (ref 45.0–50.0)

## 2015-02-26 LAB — CBG MONITORING, ED: Glucose-Capillary: 428 mg/dL — ABNORMAL HIGH (ref 65–99)

## 2015-02-26 LAB — LIPASE, BLOOD: LIPASE: 20 U/L — AB (ref 22–51)

## 2015-02-26 LAB — I-STAT TROPONIN, ED: Troponin i, poc: 0 ng/mL (ref 0.00–0.08)

## 2015-02-26 LAB — MAGNESIUM: MAGNESIUM: 1.9 mg/dL (ref 1.7–2.4)

## 2015-02-26 LAB — BRAIN NATRIURETIC PEPTIDE: B Natriuretic Peptide: 47.2 pg/mL (ref 0.0–100.0)

## 2015-02-26 LAB — POC URINE PREG, ED: Preg Test, Ur: NEGATIVE

## 2015-02-26 MED ORDER — MORPHINE SULFATE 4 MG/ML IJ SOLN
4.0000 mg | Freq: Once | INTRAMUSCULAR | Status: AC
  Administered 2015-02-26: 4 mg via INTRAVENOUS
  Filled 2015-02-26: qty 1

## 2015-02-26 MED ORDER — SODIUM CHLORIDE 0.9 % IV BOLUS (SEPSIS)
1000.0000 mL | Freq: Once | INTRAVENOUS | Status: AC
  Administered 2015-02-26: 1000 mL via INTRAVENOUS

## 2015-02-26 MED ORDER — OXYCODONE-ACETAMINOPHEN 5-325 MG PO TABS: 1.0000 | ORAL_TABLET | Freq: Four times a day (QID) | ORAL | Status: DC | PRN

## 2015-02-26 MED ORDER — ASPIRIN 81 MG PO CHEW
324.0000 mg | CHEWABLE_TABLET | Freq: Once | ORAL | Status: AC
  Administered 2015-02-26: 324 mg via ORAL
  Filled 2015-02-26: qty 4

## 2015-02-26 MED ORDER — SULFAMETHOXAZOLE-TRIMETHOPRIM 800-160 MG PO TABS: 1.0000 | ORAL_TABLET | Freq: Two times a day (BID) | ORAL | Status: DC

## 2015-02-26 MED ORDER — NITROGLYCERIN 0.4 MG SL SUBL
0.4000 mg | SUBLINGUAL_TABLET | SUBLINGUAL | Status: DC | PRN
  Administered 2015-02-26 (×3): 0.4 mg via SUBLINGUAL
  Filled 2015-02-26: qty 1

## 2015-02-26 MED ORDER — CEFTRIAXONE SODIUM 1 G IJ SOLR
1.0000 g | Freq: Once | INTRAMUSCULAR | Status: AC
  Administered 2015-02-26: 1 g via INTRAVENOUS
  Filled 2015-02-26: qty 10

## 2015-02-26 MED ORDER — NAPROXEN 500 MG PO TABS
250.0000 mg | ORAL_TABLET | Freq: Three times a day (TID) | ORAL | Status: DC | PRN
Start: 2015-02-26 — End: 2015-12-07

## 2015-02-26 NOTE — ED Provider Notes (Signed)
CSN: [id number]     Arrival date & time 02/26/15  1152 History   First MD Initiated Contact with Patient 02/26/15 1216     Chief Complaint  Patient presents with  . Hyperglycemia  . Flank Pain     (Consider location/radiation/quality/duration/timing/severity/associated sxs/prior Treatment) HPI Comments: Kathy Phelps is a 34 y.o. female with a PMHx of HTN, HLD, DM2, bipolar affective disorder, depression, schizophrenia, who presents to the ED with multiple complaints. Primarily she complains of left flank pain and decreased urine output 2 days. She states that she went her primary care doctor's office today and was found to have a hemoglobin A1c of 12.6, a CBG of 484, and when asked to produce urine she was unable, therefore her PCP sent her here. She states that prior to her diminished urine output she had increased urinary frequency. She is now complaining of 10/10 constant sharp left flank pain which is nonradiating, worse with movement, and improved with pressure applied to that area, with no medications tried prior to arrival. She denies having hematuria or dysuria when she last urinated. Her insulin regimen is 120U lantus QHS and sliding scale novolog with meals, states she's complaint with her meds but nursing note states that she admitted that she is noncompliant. She states her CBGs are typically in the [place].   Additionally she reports intermittent central chest pain that radiates to the left shoulder, and intermittent shortness of breath. These are both present at this time. She denies any fevers, chills, cough, hemoptysis, claudication, PND, orthopnea, extremity swelling, abdominal pain, nausea, vomiting, diarrhea, constipation, melena, hematochezia, midline back pain, cauda equina symptoms, vaginal bleeding or discharge, pelvic pain, numbness, tingling, weakness, diaphoresis, dizziness, lightheadedness, vision changes, or recent trauma to her back. She denies any recent  travel/surgery/immobilizations, history of cardiac disease or blood clots, or estrogen use. She has a significant family history of cardiac disease in both sides of her family, stating that multiple members of her family have had heart attacks. She is a smoker.  Patient is a 34 y.o. female presenting with hyperglycemia and flank pain. The history is provided by the patient. No language interpreter was used.  Hyperglycemia Blood sugar level PTA:  484 Severity:  Moderate Onset quality:  Unable to specify Timing:  Constant Progression:  Unchanged Chronicity:  Chronic Diabetes status:  Controlled with insulin Current diabetic therapy:  120U lantus QHS and sliding scale novolog Context: noncompliance   Relieved by:  None tried Ineffective treatments:  None tried Associated symptoms: chest pain (intermittently) and shortness of breath (intermittently)   Associated symptoms: no abdominal pain, no blurred vision, no confusion, no diaphoresis, no dizziness, no dysuria, no fever, no nausea, no polyuria, no syncope, no vomiting and no weakness   Flank Pain Associated symptoms include chest pain (intermittently). Pertinent negatives include no abdominal pain, arthralgias, chills, coughing, diaphoresis, fever, headaches, myalgias, nausea, numbness, rash, vomiting or weakness.    Past Medical History  Diagnosis Date  . Hypertension   . Diabetes mellitus   . Hyperlipidemia   . Pregnancy complication     HELP Syndrome  . Complete miscarriage   . Bipolar affective   . Depression   . Schizophrenia    Past Surgical History  Procedure Laterality Date  . Cesarean section    . Cholecystectomy    . Tubal ligation     No family history on file. History  Substance Use Topics  . Smoking status: Current Every Day Smoker -- 0.50 packs/day  Types: Cigarettes  . Smokeless tobacco: Never Used  . Alcohol Use: No   OB History    No data available     Review of Systems  Constitutional: Negative  for fever, chills and diaphoresis.  Eyes: Negative for blurred vision.  Respiratory: Positive for shortness of breath (intermittently). Negative for cough and wheezing.   Cardiovascular: Positive for chest pain (intermittently). Negative for leg swelling and syncope.  Gastrointestinal: Negative for nausea, vomiting, abdominal pain, diarrhea, constipation, blood in stool and rectal pain.  Endocrine: Negative for polyuria.  Genitourinary: Positive for frequency (before she stopped urinating), flank pain and decreased urine volume. Negative for dysuria, hematuria, vaginal bleeding, vaginal discharge and pelvic pain.  Musculoskeletal: Negative for myalgias, back pain and arthralgias.  Skin: Negative for rash.  Allergic/Immunologic: Positive for immunocompromised state (diabetic).  Neurological: Negative for dizziness, syncope, weakness, light-headedness, numbness and headaches.  Psychiatric/Behavioral: Negative for confusion.   10 Systems reviewed and are negative for acute change except as noted in the HPI.    Allergies  Hydrocodone-acetaminophen  Home Medications   Prior to Admission medications   Medication Sig Start Date End Date Taking? Authorizing Provider  ALPRAZolam Duanne Moron) 1 MG tablet Take 1 mg by mouth 3 (three) times daily.    Historical Provider, MD  cyclobenzaprine (FLEXERIL) 10 MG tablet Take 1 tablet (10 mg total) by mouth 2 (two) times daily as needed for muscle spasms. 07/16/14   Nicole Pisciotta, PA-C  HYDROcodone-acetaminophen (NORCO/VICODIN) 5-325 MG per tablet Take 2 tablets by mouth every 4 (four) hours as needed. 07/01/14   Comer Locket, PA-C  ibuprofen (ADVIL,MOTRIN) 800 MG tablet Take 1 tablet (800 mg total) by mouth 3 (three) times daily. 07/13/14   Domenic Moras, PA-C  lithium carbonate (ESKALITH) 450 MG CR tablet Take 450 mg by mouth 2 (two) times daily.    Historical Provider, MD  metFORMIN (GLUCOPHAGE) 1000 MG tablet Take 1 tablet (1,000 mg total) by mouth 2 (two)  times daily. 07/16/14   Nicole Pisciotta, PA-C  metFORMIN (GLUCOPHAGE) 500 MG tablet Take 1,000 mg by mouth 2 (two) times daily.    Historical Provider, MD  naproxen (NAPROSYN) 500 MG tablet Take 1 tablet (500 mg total) by mouth 2 (two) times daily. 07/01/14   Comer Locket, PA-C  oxyCODONE-acetaminophen (PERCOCET/ROXICET) 5-325 MG per tablet Take 1 tablet by mouth every 8 (eight) hours as needed for severe pain. 08/08/14   Montine Circle, PA-C  penicillin v potassium (VEETID) 500 MG tablet Take 1 tablet (500 mg total) by mouth 3 (three) times daily. 07/13/14   Domenic Moras, PA-C  QUEtiapine (SEROQUEL) 400 MG tablet Take 400 mg by mouth at bedtime.    Historical Provider, MD   BP 123/92 mmHg  Pulse 80  Temp(Src) 98 F (36.7 C) (Oral)  Resp 20  SpO2 100%  LMP 02/22/2015 (Approximate) Physical Exam  Constitutional: She is oriented to person, place, and time. Vital signs are normal. She appears well-developed and well-nourished.  Non-toxic appearance. No distress.  Afebrile, nontoxic, NAD  HENT:  Head: Normocephalic and atraumatic.  Mouth/Throat: Oropharynx is clear and moist and mucous membranes are normal.  Eyes: Conjunctivae and EOM are normal. Right eye exhibits no discharge. Left eye exhibits no discharge.  Neck: Normal range of motion. Neck supple.  Cardiovascular: Normal rate, regular rhythm, normal heart sounds and intact distal pulses.  Exam reveals no gallop and no friction rub.   No murmur heard. RRR, nl s1/s2, no m/r/g, distal pulses intact, no pedal edema  Pulmonary/Chest: Effort normal and breath sounds normal. No respiratory distress. She has no decreased breath sounds. She has no wheezes. She has no rhonchi. She has no rales. She exhibits no tenderness, no crepitus, no deformity and no retraction.  CTAB in all lung fields, no w/r/r, no hypoxia or increased WOB, speaking in full sentences, SpO2 100% on RA  No chest wall TTP, no crepitus or deformities, no retractions   Abdominal: Soft. Normal appearance and bowel sounds are normal. She exhibits no distension. There is tenderness in the suprapubic area. There is CVA tenderness (L sided). There is no rigidity, no rebound, no guarding, no tenderness at McBurney's point and negative Murphy's sign.  Soft, nondistended, +BS throughout, with very mild suprapubic tenderness, no r/g/r, neg murphy's, neg mcburney's, +L sided CVA TTP   Musculoskeletal: Normal range of motion.  MAE x4 Strength and sensation grossly intact Distal pulses intact No pedal edema, neg homan's bilaterally   Neurological: She is alert and oriented to person, place, and time. She has normal strength. No sensory deficit.  Skin: Skin is warm, dry and intact. No rash noted.  Psychiatric: She has a normal mood and affect.  Nursing note and vitals reviewed.   ED Course  Procedures (including critical care time)  Bladder scan: 310 mL  Labs Review Labs Reviewed  COMPREHENSIVE METABOLIC PANEL - Abnormal; Notable for the following:    Glucose, Bld 477 (*)    Calcium 8.4 (*)    Total Protein 6.3 (*)    Albumin 3.2 (*)    AST 12 (*)    ALT <5 (*)    All other components within normal limits  LIPASE, BLOOD - Abnormal; Notable for the following:    Lipase 20 (*)    All other components within normal limits  BLOOD GAS, VENOUS - Abnormal; Notable for the following:    pH, Ven 7.404 (*)    pCO2, Ven 39.5 (*)    pO2, Ven 52.8 (*)    Bicarbonate 24.2 (*)    All other components within normal limits  URINALYSIS, ROUTINE W REFLEX MICROSCOPIC - Abnormal; Notable for the following:    Specific Gravity, Urine 1.038 (*)    Glucose, UA >1000 (*)    Nitrite POSITIVE (*)    All other components within normal limits  URINE MICROSCOPIC-ADD ON - Abnormal; Notable for the following:    Bacteria, UA FEW (*)    All other components within normal limits  CBG MONITORING, ED - Abnormal; Notable for the following:    Glucose-Capillary 428 (*)    All other  components within normal limits  URINE CULTURE  CBC WITH DIFFERENTIAL/PLATELET  MAGNESIUM  PHOSPHORUS  BRAIN NATRIURETIC PEPTIDE  CBG MONITORING, ED  I-STAT TROPOININ, ED  POC URINE PREG, ED    Imaging Review Dg Chest 2 View  02/26/2015   CLINICAL DATA:  Chest pain radiating into left shoulder.  EXAM: CHEST - 2 VIEW  COMPARISON:  07/16/2014  FINDINGS: The heart size and mediastinal contours are within normal limits. There is no evidence of pulmonary edema, consolidation, pneumothorax, nodule or pleural fluid. The visualized skeletal structures are unremarkable.  IMPRESSION: No active disease.   Electronically Signed   By: Aletta Edouard M.D.   On: 02/26/2015 14:09     EKG Interpretation   Date/Time:  Wednesday Feb 26 2015 14:05:15 EDT Ventricular Rate:  76 PR Interval:  136 QRS Duration: 86 QT Interval:  407 QTC Calculation: 458 R Axis:   54 Text Interpretation:  Sinus rhythm Multiple ventricular premature  complexes Low voltage, precordial leads Lead(s) aVL were not used for  morphology analysis Sinus rhythm Premature ventricular complexes Abnormal  ekg Confirmed by Carmin Muskrat  MD [phone]) on 02/26/2015 3:22:08 PM      MDM   Final diagnoses:  Chest pain  Atypical chest pain  Hyperglycemia due to type 2 diabetes mellitus  UTI (lower urinary tract infection)  Urinary retention  Left flank pain    34 y.o. female here with multiple complaints, primarily L flank pain x2 days and no urine output. Also having intermittent CP and SOB. States she's complaint on insulin to me, but nursing note states she's not, and she admits her CBGs are in the 700s on a regular basis. Hgb A1C done at PCP's office today and was 12.6. On exam, somewhat tender in suprapubic area but denies pain without palpation, also has L CVA TTP. No pelvic complaints, and all extremities neurovascularly intact with good strength/sensation, no cauda equina symptoms. Will bladder scan, if retaining urine will  place foley. Will get labs, CXR, EKG, VBG, Mg, Phos, U/A, Upreg, and give morphine/asa/ntg for her symptoms. Will give fluids. Will hold off on CT scanning since this seems more likely to be due to diabetic bladder dysfunction  With possible pyelo/UTI vs kidney failure, doubt CT is necessary at this time. Will reassess after labs.  1:13 PM Bladder scan 310, will place foley.   3:21 PM CBC w/diff unremarkable. Upreg neg. Trop neg. VBG unconcerning. U/A with +nitrites, neg leuks, no ketones, rare squamous, 0-2 WBC, and few bacteria on a catheterized sample, which indicates likely UTI as source of retention and symptoms. Will give rocephin. CXR WNL. EKG with some PVCs in NSR, nonischemic. NTG has helped her chest pain resolve entirely, but her flank pain persists, will give more morphine now. Awaiting BNP, CMP, lipase, Mg, and Phos.   3:55 PM BNP WNL. CMP notable for gluc 477 without anion gap or bicarb changes, no signs of DKA. Lipase WNL. Phos and Mg WNL. Given that pt has had intermittent CP x2 days and neg trop here, doubt need for ACS rule out. Will have her f/up with PCP and urology. Discussed importance of insulin regimen compliance and diet control. Will give pain meds. I explained the diagnosis and have given explicit precautions to return to the ER including for any other new or worsening symptoms. The patient understands and accepts the medical plan as it's been dictated and I have answered their questions. Discharge instructions concerning home care and prescriptions have been given. The patient is STABLE and is discharged to home in good condition.  BP 118/89 mmHg  Pulse 65  Temp(Src) 98 F (36.7 C) (Oral)  Resp 18  SpO2 100%  LMP 02/22/2015 (Approximate)  Meds ordered this encounter  Medications  . sodium chloride 0.9 % bolus 1,000 mL    Sig:   . aspirin chewable tablet 324 mg    Sig:   . nitroGLYCERIN (NITROSTAT) SL tablet 0.4 mg    Sig:   . morphine 4 MG/ML injection 4 mg     Sig:   . cefTRIAXone (ROCEPHIN) 1 g in dextrose 5 % 50 mL IVPB    Sig:   . morphine 4 MG/ML injection 4 mg    Sig:   . sulfamethoxazole-trimethoprim (BACTRIM DS,SEPTRA DS) 800-160 MG per tablet    Sig: Take 1 tablet by mouth 2 (two) times daily.    Dispense:  14 tablet  Refill:  0    Order Specific Question:  Supervising Provider    Answer:  Sabra Heck, BRIAN [[id number]]  . oxyCODONE-acetaminophen (PERCOCET) 5-325 MG per tablet    Sig: Take 1 tablet by mouth every 6 (six) hours as needed for severe pain.    Dispense:  10 tablet    Refill:  0    Order Specific Question:  Supervising Provider    Answer:  Sabra Heck, BRIAN [[id number]]  . naproxen (NAPROSYN) 500 MG tablet    Sig: Take 0.5 tablets (250 mg total) by mouth 3 (three) times daily as needed for mild pain or moderate pain (TAKE WITH MEALS.).    Dispense:  10 tablet    Refill:  0    Order Specific Question:  Supervising Provider    Answer:  Noemi Chapel [[id number]]      Ashwini Jago Camprubi-Soms, PA-C 02/26/15 1611  Carmin Muskrat, MD 02/28/15 [phone]

## 2015-02-26 NOTE — ED Notes (Signed)
Note:  Bladder scan showed 371ml of urine in bladder; our P.A., Dewitt Hoes, notified of this.

## 2015-02-26 NOTE — ED Notes (Signed)
After initial trial with IV morphine; she was still found to have chest pain, so sl ntg given.

## 2015-02-26 NOTE — ED Notes (Signed)
Pt presents via EMS from a [place] [facility] for c/o hyperglycemia and left flank pain. For EMS, pt's CBG was 484. Pt is non-compliant with her medication. Pt reports she is also c/o left flank pain and says that she is only produced a small amount of urine over the last few days. Denies pain with urination.

## 2015-02-26 NOTE — Discharge Instructions (Signed)
Your pain and urinary retention is likely related to your uncontrolled diabetes and a urinary tract infection. Stay very well hydrated with plenty of water throughout the day. Take antibiotic until completed. Use naprosyn and norco as directed as needed for pain, don't drive while taking norco. You MUST take your insulin like you're supposed to, and monitor your sugar frequently. Follow up with primary care physician in 1 week for recheck of ongoing symptoms and management of your diabetes, and follow up with urology for your urinary retention and management of your foley catheter, but return to ER for emergent changing or worsening of symptoms. Please seek immediate care if you develop the following: You develop back pain.  Your symptoms are no better, or worse in 3 days. There is severe back pain or lower abdominal pain.  You develop chills.  You have a fever.  There is nausea or vomiting.  There is continued burning or discomfort with urination.     Flank Pain Flank pain refers to pain that is located on the side of the body between the upper abdomen and the back. The pain may occur over a short period of time (acute) or may be long-term or reoccurring (chronic). It may be mild or severe. Flank pain can be caused by many things. CAUSES  Some of the more common causes of flank pain include:  Muscle strains.   Muscle spasms.   A disease of your spine (vertebral disk disease).   A lung infection (pneumonia).   Fluid around your lungs (pulmonary edema).   A kidney infection.   Kidney stones.   A very painful skin rash caused by the chickenpox virus (shingles).   Gallbladder disease.  [facility] care will depend on the cause of your pain. In general,  Rest as directed by your caregiver.  Drink enough fluids to keep your urine clear or pale yellow.  Only take over-the-counter or prescription medicines as directed by your caregiver. Some medicines may  help relieve the pain.  Tell your caregiver about any changes in your pain.  Follow up with your caregiver as directed. SEEK IMMEDIATE MEDICAL CARE IF:   Your pain is not controlled with medicine.   You have new or worsening symptoms.  Your pain increases.   You have abdominal pain.   You have shortness of breath.   You have persistent nausea or vomiting.   You have swelling in your abdomen.   You feel faint or pass out.   You have blood in your urine.  You have a fever or persistent symptoms for more than 2-3 days.  You have a fever and your symptoms suddenly get worse. MAKE SURE YOU:   Understand these instructions.  Will watch your condition.  Will get help right away if you are not doing well or get worse. Document Released: 11/18/2005 Document Revised: 06/21/2012 Document Reviewed: 05/11/2012 [facility] Patient Information 2015 [place], [place]. This information is not intended to replace advice given to you by your health care provider. Make sure you discuss any questions you have with your health care provider.  How to Avoid Diabetes Problems You can do a lot to prevent or slow down diabetes problems. Following your diabetes plan and taking care of yourself can reduce your risk of serious or life-threatening complications. Below, you will find certain things you can do to prevent diabetes problems. MANAGE YOUR DIABETES Follow your health care provider's, nurse educator's, and dietitian's instructions for managing your diabetes. They will  teach you the basics of diabetes care. They can help answer questions you may have. Learn about diabetes and make healthy choices regarding eating and physical activity. Monitor your blood glucose level regularly. Your health care provider will help you decide how often to check your blood glucose level depending on your treatment goals and how well you are meeting them.  DO NOT USE NICOTINE Nicotine and diabetes are a dangerous  combination. Nicotine raises your risk for diabetes problems. If you quit using nicotine, you will lower your risk for heart attack, stroke, nerve disease, and kidney disease. Your cholesterol and your blood pressure levels may improve. Your blood circulation will also improve. Do not use any tobacco products, including cigarettes, chewing tobacco, or electronic cigarettes. If you need help quitting, ask your health care provider. KEEP YOUR BLOOD PRESSURE UNDER CONTROL Keeping your blood pressure under control will help prevent damage to your eyes, kidneys, heart, and blood vessels. Blood pressure consists of two numbers. The top number should be below 120, and the bottom number should be below 80 (120/80). Keep your blood pressure as close to these numbers as you can. If you already have kidney disease, you may want even lower blood pressure to protect your kidneys. Talk to your health care provider to make sure that your blood pressure goal is right for your needs. Meal planning, medicines, and exercise can help you reach your blood pressure target. Have your blood pressure checked at every visit with your health care provider. KEEP YOUR CHOLESTEROL UNDER CONTROL Normal cholesterol levels will help prevent heart disease and stroke. These are the biggest health problems for people with diabetes. Keeping cholesterol levels under control can also help with blood flow. Have your cholesterol level checked at least once a year. Your health care provider may prescribe a medicine known as a statin. Statins lower your cholesterol. If you are not taking a statin, ask your health care provider if you should be. Meal planning, exercise, and medicines can help you reach your cholesterol targets.  SCHEDULE AND KEEP YOUR ANNUAL PHYSICAL EXAMS AND EYE EXAMS Your health care provider will tell you how often he or she wants to see you depending on your plan of treatment. It is important that you keep these appointments so  that possible problems can be identified early and complications can be avoided or treated.  Every visit with your health care provider should include your weight, blood pressure, and an evaluation of your blood glucose control.  Your hemoglobin A1c should be checked:  At least twice a year if you are at your goal.  Every 3 months if there are changes in treatment.  If you are not meeting your goals.  Your blood lipids should be checked yearly. You should also be checked yearly to see if you have protein in your urine (microalbumin).  Schedule a dilated eye exam within 5 years of your diagnosis if you have type 1 diabetes, and then yearly. Schedule a dilated eye exam at diagnosis if you have type 2 diabetes, and then yearly. All exams thereafter can be extended to every 2 to 3 years if one or more exams have been normal. KEEP YOUR VACCINES CURRENT The flu vaccine is recommended yearly. The formula for the vaccine changes every year and needs to be updated for the best protection against current viruses. It is recommended that people with diabetes who are over 53 years old get the pneumonia vaccine. In some cases, two separate shots may be  given. Ask your health care provider if your pneumonia vaccination is up-to-date. However, there are some instances where another vaccine is recommended. Check with your health care provider. TAKE CARE OF YOUR FEET  Diabetes may cause you to have a poor blood supply (circulation) to your legs and feet. Because of this, the skin may be thinner, break easier, and heal more slowly. You also may have nerve damage in your legs and feet, causing decreased feeling. You may not notice minor injuries to your feet that could lead to serious problems or infections. Taking care of your feet is very important. Visual foot exams are performed at every routine medical visit. The exams check for cuts, injuries, or other problems with the feet. A comprehensive foot exam should be  done yearly. This includes visual inspection as well as assessing foot pulses and testing for loss of sensation. You should also do the following:  Inspect your feet daily for cuts, calluses, blisters, ingrown toenails, and signs of infection, such as redness, swelling, or pus.  Wash and dry your feet thoroughly, especially between the toes.  Avoid soaking your feet regularly in hot water baths.  Moisturize dry skin with lotion, avoiding areas between your toes.  Cut toenails straight across and file the edges.  Avoid shoes that do not fit well or have areas that irritate your skin.  Avoid going barefooted or wearing only socks. Your feet need protection. TAKE CARE OF YOUR TEETH People with poorly controlled diabetes are more likely to have gum (periodontal) disease. These infections make diabetes harder to control. Periodontal diseases, if left untreated, can lead to tooth loss. Brush your teeth twice a day, floss, and see your dentist for checkups and cleaning every 6 months, or 2 times a year. ASK YOUR HEALTH CARE PROVIDER ABOUT TAKING ASPIRIN Taking aspirin daily is recommended to help prevent cardiovascular disease in people with and without diabetes. Ask your health care provider if this would benefit you and what dose he or she would recommend. DRINK RESPONSIBLY Moderate amounts of alcohol (less than 1 drink per day for adult women and less than 2 drinks per day for adult men) have a minimal effect on blood glucose if ingested with food. It is important to eat food with alcohol to avoid hypoglycemia. People should avoid alcohol if they have a history of alcohol abuse or dependence, if they are pregnant, and if they have liver disease, pancreatitis, advanced neuropathy, or severe hypertriglyceridemia. LESSEN STRESS Living with diabetes can be stressful. When you are under stress, your blood glucose may be affected in two ways:  Stress hormones may cause your blood glucose to  rise.  You may be distracted from taking good care of yourself. It is a good idea to be aware of your stress level and make changes that are necessary to help you better manage challenging situations. Support groups, planned relaxation, a hobby you enjoy, meditation, healthy relationships, and exercise all work to lower your stress level. If your efforts do not seem to be helping, get help from your health care provider or a trained mental health professional. Document Released: 06/15/2011 Document Revised: 02/11/2014 Document Reviewed: 11/21/2013 [facility] Patient Information 2015 [place], [place]. This information is not intended to replace advice given to you by your health care provider. Make sure you discuss any questions you have with your health care provider.  Urinary Tract Infection Urinary tract infections (UTIs) can develop anywhere along your urinary tract. Your urinary tract is your body's drainage system  for removing wastes and extra water. Your urinary tract includes two kidneys, two ureters, a bladder, and a urethra. Your kidneys are a pair of bean-shaped organs. Each kidney is about the size of your fist. They are located below your ribs, one on each side of your spine. CAUSES Infections are caused by microbes, which are microscopic organisms, including fungi, viruses, and bacteria. These organisms are so small that they can only be seen through a microscope. Bacteria are the microbes that most commonly cause UTIs. SYMPTOMS  Symptoms of UTIs may vary by age and gender of the patient and by the location of the infection. Symptoms in young women typically include a frequent and intense urge to urinate and a painful, burning feeling in the bladder or urethra during urination. Older women and men are more likely to be tired, shaky, and weak and have muscle aches and abdominal pain. A fever may mean the infection is in your kidneys. Other symptoms of a kidney infection include pain in your back  or sides below the ribs, nausea, and vomiting. DIAGNOSIS To diagnose a UTI, your caregiver will ask you about your symptoms. Your caregiver also will ask to provide a urine sample. The urine sample will be tested for bacteria and white blood cells. White blood cells are made by your body to help fight infection. TREATMENT  Typically, UTIs can be treated with medication. Because most UTIs are caused by a bacterial infection, they usually can be treated with the use of antibiotics. The choice of antibiotic and length of treatment depend on your symptoms and the type of bacteria causing your infection. HOME CARE INSTRUCTIONS  If you were prescribed antibiotics, take them exactly as your caregiver instructs you. Finish the medication even if you feel better after you have only taken some of the medication.  Drink enough water and fluids to keep your urine clear or pale yellow.  Avoid caffeine, tea, and carbonated beverages. They tend to irritate your bladder.  Empty your bladder often. Avoid holding urine for long periods of time.  Empty your bladder before and after sexual intercourse.  After a bowel movement, women should cleanse from front to back. Use each tissue only once. SEEK MEDICAL CARE IF:   You have back pain.  You develop a fever.  Your symptoms do not begin to resolve within 3 days. SEEK IMMEDIATE MEDICAL CARE IF:   You have severe back pain or lower abdominal pain.  You develop chills.  You have nausea or vomiting.  You have continued burning or discomfort with urination. MAKE SURE YOU:   Understand these instructions.  Will watch your condition.  Will get help right away if you are not doing well or get worse. Document Released: 07/07/2005 Document Revised: 03/28/2012 Document Reviewed: 11/05/2011 [facility] Patient Information 2015 [place], [place]. This information is not intended to replace advice given to you by your health care provider. Make sure you discuss  any questions you have with your health care provider.  Acute Urinary Retention Acute urinary retention is the temporary inability to urinate. This is an uncommon problem in women. It can be caused by:  Infection.  A side effect of a medicine.  A problem in a nearby organ that presses or squeezes on the bladder or the urethra (the tube that drains the bladder).  Psychological problems.   Surgery on your bladder, urethra, or pelvic organs that causes obstruction to the outflow of urine from your bladder. HOME CARE INSTRUCTIONS  If you are  sent home with a Foley catheter and a drainage system, you will need to discuss the best course of action with your health care provider. While the catheter is in, maintain a good intake of fluids. Keep the drainage bag emptied and lower than your catheter. This is so that contaminated urine will not flow back into your bladder, which could lead to a urinary tract infection. There are two main types of drainage bags. One is a large bag that usually is used at night. It has a good capacity that will allow you to sleep through the night without having to empty it. The second type is called a leg bag. It has a smaller capacity so it needs to be emptied more frequently. However, the main advantage is that it can be attached by a leg strap and goes underneath your clothing, allowing you the freedom to move about or leave your home. Only take over-the-counter or prescription medicines for pain, discomfort, or fever as directed by your health care provider.  SEEK MEDICAL CARE IF:  You develop a low-grade fever.  You experience spasms or leakage of urine with the spasms. SEEK IMMEDIATE MEDICAL CARE IF:   You develop chills or fever.  Your catheter stops draining urine.  Your catheter falls out.  You start to develop increased bleeding that does not respond to rest and increased fluid intake. MAKE SURE YOU:  Understand these instructions.  Will watch your  condition.  Will get help right away if you are not doing well or get worse. Document Released: 09/26/2006 Document Revised: 07/18/2013 Document Reviewed: 03/08/2013 [facility] Patient Information 2015 [place], [place]. This information is not intended to replace advice given to you by your health care provider. Make sure you discuss any questions you have with your health care provider.  Insulin Treatment for Diabetes Diabetes is a disease that does not go away (chronic). It occurs when the body does not properly use the sugar (glucose) that is released from food after it is digested. Glucose levels are controlled by a hormone called insulin, which is made by your pancreas. Depending on the type of diabetes you have, either of the following will apply:   The pancreas does not make any insulin (type 1 diabetes).  The pancreas makes too little insulin, and the body cannot respond normally to the insulin that is made (type 2 diabetes). Without insulin, death can occur. However, with the addition of insulin, blood sugar monitoring, and treatment, someone with diabetes can live a full and productive life. This document will discuss the role of insulin in your treatment and provide information about its use.  HOW IS INSULIN GIVEN? Insulin is a medicine that can only be given by injection. Taking it by mouth makes it inactive because of the acid in your stomach. Insulin is injected under the skin by a syringe and needle, an insulin pen, a pump, or a jet injector. Your dose will be determined by your health care provider based on your individual needs. You will also be given guidance on which method of giving insulin is right for you. Remember that if you give insulin with a needle and syringe, you must do so using only a special insulin syringe made for this purpose. WHERE ON THE BODY SHOULD INSULIN BE INJECTED? Insulin is injected into the fatty layer of tissue just under your skin. Good places to inject  insulin include the upper arm, the front and outer area of the thigh, the hips, and the abdomen.  Giving your insulin in the abdomen is preferred because this provides the most rapid and consistent absorption. Avoid the area 2 inches (5 cm) around the navel and avoid injecting into areas on your body with scar tissue. In addition, it is important to rotate your injection sites with every shot to prevent irritation and improve absorption. WHAT ARE THE DIFFERENT TYPES OF INSULIN?  If you have type 1 diabetes, you must take insulin to stay alive. Your body does not produce it. If you have type 2 diabetes, you might require insulin in addition to, or instead of, other medicines. In either case, proper use of insulin is critical to control your diabetes.  There are a number of different types of insulin. Usually, you will give yourself injections, though others can be trained to give them to you. Some people have an insulin pump that delivers insulin continuously through a tube (cannula) that is placed under the skin. Using insulin requires that you check your blood sugar several times a day. The exact number of times and time of day to check will vary depending on your type of diabetes, your type of insulin, and treatment goals. Your health care provider will direct you.  Generally, different insulins have different properties. The following is a general guide. Specifics will vary by product, and new products are introduced periodically.   Rapid-acting insulin starts working quickly (in as little as 5 minutes) and wears off in 4 to 6 hours (sometimes longer). This type of insulin works well when taken just before a meal to bring your blood sugar quickly back to normal.   Short-acting insulin starts working in about 30 minutes and can last 6 to 10 hours. This type of insulin should be taken about 30 minutes before you start eating a meal.  Intermediate-acting insulin starts working in 1-2 hours and wears off  after about 10 to 18 hours. This insulin will lower your blood sugar for a longer period of time, but it will not be as effective in lowering your blood sugar right after a meal.   Long-acting insulin mimics the small amount of insulin that your pancreas usually produces throughout the day. You need to have some insulin present at all times. It is crucial to the metabolism of brain cells and other cells. Long-acting insulin is meant to be used either once or twice a day. It is usually used in combination with other types of insulin, or in combination with other diabetes medicines.  Discuss the type of insulin you are taking with your health care provider or pharmacist. You will then be aware of when the insulin can be expected to peak and when it will wear off. This is important to know so you can plan for meal times and periods of exercise.  Your health care provider will usually have a strategy in mind when treating you with insulin. This will vary with your type of diabetes, your diabetes treatment goals, and your health history. It is important that you understand this strategy so you can be an active partner in treating your diabetes. Here are some terms you might hear:   Basal insulin. This refers to the small amount of insulin that needs to be present in your blood at all times. Sometimes oral medicines will be enough. For other people, and especially for people with type 1 diabetes, insulin is needed. Usually, intermediate-acting or long-acting insulin is used once or twice a day to accomplish this.   Prandial (meal-related) insulin.  Your blood sugar will rise rapidly after a meal. Rapid-acting or short-acting insulin can be used right before the meal to bring your blood sugar back to normal quickly. You might be instructed to adjust the amount of insulin depending on how much carbohydrate (starch) is in your meal.   Corrective insulin. You might be instructed to check your blood sugar at  certain times of the day. You then might use a small amount of rapid-acting or short-acting insulin to bring the blood sugar down to normal if it is elevated.   Tight control (also called intensive therapy). Tight control means keeping your blood sugar as close to your target as possible and keeping it from going too high after meals. People with tight control of their diabetes are shown to have fewer long-term problems from their diabetes.   Glycohemoglobin (also called glyco, glycosylated hemoglobin, hemoglobin A1c, or A1c) level. This measures how well your blood sugar has been controlled during the past 1 to 3 months. It helps your health care provider see how effective your treatment is and decide if any changes are needed. Your health care provider will discuss your target glycohemoglobin level with you.  Insulin treatment requires your careful attention. Treatment plans will be different for different people. Some people do well with a simple program. Others require more complicated programs, with multiple insulin injections daily. You will work with your health care provider to develop the best program for you. Regardless of your insulin treatment plan, you must also do your best on weight control, diet and food choices, exercise, blood pressure control, cholesterol control, and stress levels.  WHAT ARE THE SIDE EFFECTS OF INSULIN? Although insulin treatment is important, it does have some side effects, such as:   Insulin can cause your blood sugar to go too low (hypoglycemia).   Weight gain can occur.   Improper injection technique can cause hypoglycemia, blood sugar to go too high (hyperglycemia), skin injury or irritation, or other problems. You must learn to inject insulin properly. Document Released: 12/24/2008 Document Revised: 02/11/2014 Document Reviewed: 03/11/2013 [facility] Patient Information 2015 [place], [place]. This information is not intended to replace advice given to you  by your health care provider. Make sure you discuss any questions you have with your health care provider.

## 2015-02-26 NOTE — ED Notes (Signed)
Note:  Total urine output from foley catheter thus far:  355ml.

## 2015-02-26 NOTE — ED Notes (Signed)
Went in to collect patient blood and patient stated that if she is getting a IV that we need to figure it out because it was one stick only.  I made nurse aware.

## 2015-02-27 ENCOUNTER — Emergency Department (HOSPITAL_COMMUNITY)
Admission: EM | Admit: 2015-02-27 | Discharge: 2015-02-27 | Discharge status: Against Medical Advice | Payer: Medicaid Other | Attending: Emergency Medicine | Admitting: Emergency Medicine

## 2015-02-27 ENCOUNTER — Encounter (HOSPITAL_COMMUNITY): Payer: Self-pay | Admitting: *Deleted

## 2015-02-27 DIAGNOSIS — R1030 Lower abdominal pain, unspecified: Secondary | ICD-10-CM | POA: Insufficient documentation

## 2015-02-27 DIAGNOSIS — E119 Type 2 diabetes mellitus without complications: Secondary | ICD-10-CM | POA: Insufficient documentation

## 2015-02-27 DIAGNOSIS — Z791 Long term (current) use of non-steroidal anti-inflammatories (NSAID): Secondary | ICD-10-CM | POA: Insufficient documentation

## 2015-02-27 DIAGNOSIS — F319 Bipolar disorder, unspecified: Secondary | ICD-10-CM | POA: Insufficient documentation

## 2015-02-27 DIAGNOSIS — Z87448 Personal history of other diseases of urinary system: Secondary | ICD-10-CM | POA: Insufficient documentation

## 2015-02-27 DIAGNOSIS — I10 Essential (primary) hypertension: Secondary | ICD-10-CM | POA: Insufficient documentation

## 2015-02-27 DIAGNOSIS — E785 Hyperlipidemia, unspecified: Secondary | ICD-10-CM | POA: Diagnosis not present

## 2015-02-27 DIAGNOSIS — Z7951 Long term (current) use of inhaled steroids: Secondary | ICD-10-CM | POA: Diagnosis not present

## 2015-02-27 DIAGNOSIS — Z79899 Other long term (current) drug therapy: Secondary | ICD-10-CM | POA: Insufficient documentation

## 2015-02-27 DIAGNOSIS — F209 Schizophrenia, unspecified: Secondary | ICD-10-CM | POA: Diagnosis not present

## 2015-02-27 DIAGNOSIS — Z720 Tobacco use: Secondary | ICD-10-CM | POA: Insufficient documentation

## 2015-02-27 DIAGNOSIS — R339 Retention of urine, unspecified: Secondary | ICD-10-CM | POA: Diagnosis present

## 2015-02-27 DIAGNOSIS — Z7982 Long term (current) use of aspirin: Secondary | ICD-10-CM | POA: Diagnosis not present

## 2015-02-27 HISTORY — DX: Disorder of kidney and ureter, unspecified: N28.9

## 2015-02-27 NOTE — ED Notes (Signed)
165 mL in bladder per bladder scan.

## 2015-02-27 NOTE — ED Notes (Signed)
Pt states she had a catheter placed yesterday at [facility] b/c of urinary retention and L flank pain.  The catheter improved the pain.  However, today pt noticed urine dripping into her underwear and not filling the bag, so she attempted to pull it out, "but it just didn't come out".  Pt now c/o pain to L kidney and perineum.  Denies urinary drainage at this time.

## 2015-02-27 NOTE — ED Provider Notes (Signed)
CSN: [id number]     Arrival date & time 02/27/15  1721 History   First MD Initiated Contact with Patient 02/27/15 1810     Chief Complaint  Patient presents with  . Urinary Retention    HPI   34 year old female with a history of hypertension hyperlipidemia, type 2 diabetes, bipolar affective disorder, depression, schizophrenia presents today with left flank pain and urinary retention 3 days. Patient reports that she was seen by her primary care doctor yesterday for a routine follow-up of her diabetes, upon review of systems is found that she hadn't urinated in 2 days. She was seen in the ED yesterday with 10 out of 10 left flank pain with no radiation, worse with palpation. She was found to have a glucose of 477 without 9 gap her bicarbonate changes with no significant signs or symptoms that would indicate DKA. She had episodic chest pain 2 days with a negative troponin yesterday no indication for ACS rule out at that time. Patient had a bladder scan showing 310 mL; followed with Foley placement. Patient reports she was given pain medication for the flank pain, and discharged home with urology follow-up. She reports that after placement of the Foley and discharge she had resolution of the flank pain, was able to make an appointment with a urologist in 5 days. She states that she had a small amount of urine leaking around the site of the Foley today, she assumed that the Foley was like a "tampon" and would be easily removed. She reports sharp pain while trying to pull the Foley out, denies bleeding or signs of trauma. Patient presents now to the emergency room with a resurgence of left flank pain and suprapubic fullness. Patient has not urinated since removing the catheter. Patient denies any other complaints including chest pain, shortness of breath, upper abdominal pain, vaginal discharge or bleeding, fever, chills.  Past Medical History  Diagnosis Date  . Hypertension   . Diabetes mellitus   .  Hyperlipidemia   . Pregnancy complication     HELP Syndrome  . Complete miscarriage   . Bipolar affective   . Depression   . Schizophrenia   . Renal disorder    Past Surgical History  Procedure Laterality Date  . Cesarean section    . Cholecystectomy    . Tubal ligation     No family history on file. History  Substance Use Topics  . Smoking status: Current Every Day Smoker -- 0.50 packs/day    Types: Cigarettes  . Smokeless tobacco: Never Used  . Alcohol Use: No   OB History    No data available     Review of Systems  All other systems reviewed and are negative.   Allergies  Hydrocodone-acetaminophen and Tylenol  Home Medications   Prior to Admission medications   Medication Sig Start Date End Date Taking? Authorizing Provider  ADVAIR DISKUS 100-50 MCG/DOSE AEPB Inhale 1 puff into the lungs daily. 01/10/15   Historical Provider, MD  ALPRAZolam Duanne Moron) 1 MG tablet Take 1 mg by mouth 3 (three) times daily.    Historical Provider, MD  ASPIRIN LOW DOSE 81 MG EC tablet Take 81 mg by mouth daily. 01/10/15   Historical Provider, MD  cyclobenzaprine (FLEXERIL) 10 MG tablet Take 1 tablet (10 mg total) by mouth 2 (two) times daily as needed for muscle spasms. Patient not taking: Reported on 02/26/2015 07/16/14   Elmyra Ricks Pisciotta, PA-C  gabapentin (NEURONTIN) 400 MG capsule Take 800 mg by mouth 3 (  three) times daily. 02/13/15   Historical Provider, MD  HYDROcodone-acetaminophen (NORCO/VICODIN) 5-325 MG per tablet Take 2 tablets by mouth every 4 (four) hours as needed. Patient not taking: Reported on 02/26/2015 07/01/14   Comer Locket, PA-C  hydrOXYzine (VISTARIL) 50 MG capsule Take 50 mg by mouth 2 (two) times daily. 01/06/15   Historical Provider, MD  ibuprofen (ADVIL,MOTRIN) 800 MG tablet Take 1 tablet (800 mg total) by mouth 3 (three) times daily. Patient not taking: Reported on 02/26/2015 07/13/14   Domenic Moras, PA-C  lamoTRIgine (LAMICTAL) 150 MG tablet Take 150 mg by mouth daily.  02/12/15   Historical Provider, MD  LANTUS 100 UNIT/ML injection Inject 120 Units into the skin at bedtime. 01/10/15   Historical Provider, MD  metFORMIN (GLUCOPHAGE) 1000 MG tablet Take 1 tablet (1,000 mg total) by mouth 2 (two) times daily. 07/16/14   Nicole Pisciotta, PA-C  metoprolol tartrate (LOPRESSOR) 25 MG tablet Take 50 mg by mouth daily. 01/06/15   Historical Provider, MD  naproxen (NAPROSYN) 250 MG tablet Take 250 mg by mouth 2 (two) times daily. 01/10/15   Historical Provider, MD  naproxen (NAPROSYN) 500 MG tablet Take 1 tablet (500 mg total) by mouth 2 (two) times daily. Patient not taking: Reported on 02/26/2015 07/01/14   Comer Locket, PA-C  naproxen (NAPROSYN) 500 MG tablet Take 0.5 tablets (250 mg total) by mouth 3 (three) times daily as needed for mild pain or moderate pain (TAKE WITH MEALS.). 02/26/15   Mercedes Camprubi-Soms, PA-C  NEXIUM 40 MG capsule Take 40 mg by mouth daily. 01/19/15   Historical Provider, MD  nicotine (NICODERM CQ - DOSED IN MG/24 HOURS) 21 mg/24hr patch Place 1 patch onto the skin daily. 01/08/15   Historical Provider, MD  NOVOLOG 100 UNIT/ML injection Inject 15 Units into the skin daily. Sliding Scale:  >300 15 units < 300 do not use 01/10/15   Historical Provider, MD  omeprazole (PRILOSEC) 20 MG capsule Take 20 mg by mouth 2 (two) times daily. 12/18/14   Historical Provider, MD  oxyCODONE-acetaminophen (PERCOCET) 5-325 MG per tablet Take 1 tablet by mouth every 6 (six) hours as needed for severe pain. 02/26/15   Mercedes Camprubi-Soms, PA-C  oxyCODONE-acetaminophen (PERCOCET/ROXICET) 5-325 MG per tablet Take 1 tablet by mouth every 8 (eight) hours as needed for severe pain. Patient not taking: Reported on 02/26/2015 08/08/14   Montine Circle, PA-C  pantoprazole (PROTONIX) 40 MG tablet Take 40 mg by mouth daily. 01/10/15   Historical Provider, MD  penicillin v potassium (VEETID) 500 MG tablet Take 1 tablet (500 mg total) by mouth 3 (three) times daily. Patient not taking:  Reported on 02/26/2015 07/13/14   Domenic Moras, PA-C  PROAIR HFA 108 (90 BASE) MCG/ACT inhaler Inhale 2 puffs into the lungs every 6 (six) hours as needed. 01/10/15   Historical Provider, MD  simvastatin (ZOCOR) 10 MG tablet Take 10 mg by mouth daily.  01/19/15   Historical Provider, MD  sulfamethoxazole-trimethoprim (BACTRIM DS,SEPTRA DS) 800-160 MG per tablet Take 1 tablet by mouth 2 (two) times daily. 02/26/15   Mercedes Camprubi-Soms, PA-C  ziprasidone (GEODON) 40 MG capsule Take 40 mg by mouth daily. 02/13/15   Historical Provider, MD   BP 144/72 mmHg  Pulse 42  Temp(Src) 98.2 F (36.8 C) (Oral)  Resp 20  Ht 5\' 9"  (1.753 m)  Wt 159 lb (72.122 kg)  BMI 23.47 kg/m2  SpO2 100%  LMP 02/22/2015 (Approximate)   Physical Exam  Constitutional: She is oriented to person, place,  and time. She appears well-developed and well-nourished.  Afebrile, in no acute distress,  HENT:  Head: Normocephalic and atraumatic.  Eyes: Pupils are equal, round, and reactive to light.  Neck: Normal range of motion. Neck supple. No JVD present. No tracheal deviation present. No thyromegaly present.  Cardiovascular: Normal rate, regular rhythm, normal heart sounds and intact distal pulses.  Exam reveals no gallop and no friction rub.   No murmur heard. Pulmonary/Chest: Effort normal and breath sounds normal. No stridor. No respiratory distress. She has no wheezes. She has no rales. She exhibits no tenderness.  Abdominal: Soft.  Patient has mild suprapubic tenderness, no signs of trauma. She has minimal pain with percussion of the CVA left. Remainder of abdominal exam benign soft nontender normal bowel sounds no acute tenderness with palpation  Musculoskeletal: Normal range of motion.  Normal range of motion, no peripheral edema or swelling.  Lymphadenopathy:    She has no cervical adenopathy.  Neurological: She is alert and oriented to person, place, and time. Coordination normal.  Skin: Skin is warm and dry.   Psychiatric: She has a normal mood and affect. Her behavior is normal. Judgment and thought content normal.  Nursing note and vitals reviewed.   ED Course  Procedures (including critical care time) Labs Review Labs Reviewed - No data to display  Imaging Review Dg Chest 2 View  02/26/2015   CLINICAL DATA:  Chest pain radiating into left shoulder.  EXAM: CHEST - 2 VIEW  COMPARISON:  07/16/2014  FINDINGS: The heart size and mediastinal contours are within normal limits. There is no evidence of pulmonary edema, consolidation, pneumothorax, nodule or pleural fluid. The visualized skeletal structures are unremarkable.  IMPRESSION: No active disease.   Electronically Signed   By: Aletta Edouard M.D.   On: 02/26/2015 14:09     EKG Interpretation None      MDM   Final diagnoses:  Urinary retention    Labs: none  Imaging: none  Consults: none  Therapeutics: none  Assessment: Urinary retention  Plan: Patient presents with urinary retention for which she had Foley catheter placed yesterday. She reports pain is relieved with catheter placement, did not need at-home medications. She reports this morning catheter was leaking so she attempted to pull it out with pain. Valuated her in the ED setting, she complained of mild suprapubic tenderness, and slowly developing left flank pain, this is identical to previous presentation yesterday. Her that we be doing a bladder scan to assess volume status of her bladder, followed by a catheter placement for evacuation of that volume. I was informed by nursing staff that patient was in the hallway attempting to leave [place]. I approached the patient and informed her that it is absolutely necessary for [place] to place the catheter in to relieve the pressure, and that if she refused to stay for that she could have serious ability dating and life-threatening complications. She reported that she did not care, that she been suffering with it for 2  days before she came in yesterday, and did not want to have a catheter in as this bothered her. i informed pt that would would be able to manage her pain here until catheter could be place; she states that she has pain medication at home.  Multiple attempts were made to try and allow [place] to place a catheter patient adamantly refused and reported that she would "come back" if she needed to. I again informed her of the potential complications and  life-threatening outcomes if she waited too long, patient was competent and fully understood the consequences if she left against our medical advice without treatment. Nursing staff attempted as well to have patient stay, refused. Patient signed out against our medical advice. I informed her that at any point she should return to the ED we would be happy to take care of her and insert a Foley catheter for urinary drainage. Patient was instructed to return immediately if symptoms continue to persist or worsen.      Okey Regal, PA-C 02/27/15 1941  Charlesetta Shanks, MD 03/06/15 [phone]

## 2015-02-27 NOTE — ED Notes (Signed)
Urinary catheter removed. Estimated 4 in of catheter inserted prior to removal.

## 2015-02-27 NOTE — ED Notes (Signed)
Pt verbalizes frustration with long wait for treatment; pt states she is in pain and requests pain medications; Merry Proud PA notified of patients decision to sign out AMA; Merry Proud explained to patient risks of leaving AMA- patient verbalized understanding and agreed to sign AMA form prior to departure; pt ambulatory to lobby with steady gait

## 2015-02-28 LAB — URINE CULTURE: Colony Count: >=100000

## 2015-03-01 ENCOUNTER — Telehealth: Payer: Self-pay | Admitting: Emergency Medicine

## 2015-03-01 NOTE — Telephone Encounter (Signed)
Post ED Visit - Positive Culture Follow-up  Culture report reviewed by antimicrobial stewardship pharmacist: []  Wes [place], Pharm.D., BCPS []  Heide Guile, Pharm.D., BCPS []  Alycia Rossetti, Pharm.D., BCPS [x]  [place], Pharm.D., BCPS, AAHIVP []  Legrand Como, Pharm.D., BCPS, AAHIVP []  Isac Sarna, Pharm.D., BCPS  Positive Urine culture Treated with Septra DS, organism sensitive to the same and no further patient follow-up is required at this time.  Ernesta Amble 03/01/2015, 5:44 PM

## 2015-05-30 ENCOUNTER — Emergency Department (HOSPITAL_COMMUNITY)
Admission: EM | Admit: 2015-05-30 | Discharge: 2015-05-30 | Disposition: A | Discharge status: Home / Self Care | Payer: Medicaid Other | Attending: Emergency Medicine | Admitting: Emergency Medicine

## 2015-05-30 ENCOUNTER — Encounter (HOSPITAL_COMMUNITY): Payer: Self-pay | Admitting: *Deleted

## 2015-05-30 DIAGNOSIS — M79672 Pain in left foot: Secondary | ICD-10-CM | POA: Diagnosis present

## 2015-05-30 DIAGNOSIS — E785 Hyperlipidemia, unspecified: Secondary | ICD-10-CM | POA: Insufficient documentation

## 2015-05-30 DIAGNOSIS — Z79899 Other long term (current) drug therapy: Secondary | ICD-10-CM | POA: Insufficient documentation

## 2015-05-30 DIAGNOSIS — F319 Bipolar disorder, unspecified: Secondary | ICD-10-CM | POA: Diagnosis not present

## 2015-05-30 DIAGNOSIS — E1341 Other specified diabetes mellitus with diabetic mononeuropathy: Secondary | ICD-10-CM | POA: Insufficient documentation

## 2015-05-30 DIAGNOSIS — Z720 Tobacco use: Secondary | ICD-10-CM | POA: Diagnosis not present

## 2015-05-30 DIAGNOSIS — Z87448 Personal history of other diseases of urinary system: Secondary | ICD-10-CM | POA: Insufficient documentation

## 2015-05-30 DIAGNOSIS — I10 Essential (primary) hypertension: Secondary | ICD-10-CM | POA: Insufficient documentation

## 2015-05-30 DIAGNOSIS — Z7982 Long term (current) use of aspirin: Secondary | ICD-10-CM | POA: Insufficient documentation

## 2015-05-30 DIAGNOSIS — F209 Schizophrenia, unspecified: Secondary | ICD-10-CM | POA: Insufficient documentation

## 2015-05-30 DIAGNOSIS — R739 Hyperglycemia, unspecified: Secondary | ICD-10-CM

## 2015-05-30 LAB — CBG MONITORING, ED
GLUCOSE-CAPILLARY: 382 mg/dL — AB (ref 65–99)
GLUCOSE-CAPILLARY: 370 mg/dL — AB (ref 65–99)
Glucose-Capillary: 427 mg/dL — ABNORMAL HIGH (ref 65–99)

## 2015-05-30 LAB — BASIC METABOLIC PANEL
Anion gap: 8 (ref 5–15)
BUN: 14 mg/dL (ref 6–20)
CO2: 25 mmol/L (ref 22–32)
CREATININE: 0.88 mg/dL (ref 0.44–1.00)
Calcium: 9.1 mg/dL (ref 8.9–10.3)
Chloride: 99 mmol/L — ABNORMAL LOW (ref 101–111)
GFR calc Af Amer: >60 mL/min (ref >60)
GLUCOSE: 424 mg/dL — AB (ref 65–99)
POTASSIUM: 4.6 mmol/L (ref 3.5–5.1)
Sodium: 132 mmol/L — ABNORMAL LOW (ref 135–145)

## 2015-05-30 LAB — CBC
HEMATOCRIT: 41.9 % (ref 36.0–46.0)
Hemoglobin: 14.8 g/dL (ref 12.0–15.0)
MCH: 31.3 pg (ref 26.0–34.0)
MCHC: 35.3 g/dL (ref 30.0–36.0)
MCV: 88.6 fL (ref 78.0–100.0)
PLATELETS: 162 10*3/uL (ref 150–400)
RBC: 4.73 MIL/uL (ref 3.87–5.11)
RDW: 12.5 % (ref 11.5–15.5)
WBC: 10.6 10*3/uL — ABNORMAL HIGH (ref 4.0–10.5)

## 2015-05-30 MED ORDER — SODIUM CHLORIDE 0.9 % IV SOLN: INTRAVENOUS | Status: DC

## 2015-05-30 MED ORDER — SODIUM CHLORIDE 0.9 % IV BOLUS (SEPSIS)
2000.0000 mL | Freq: Once | INTRAVENOUS | Status: AC
  Administered 2015-05-30: 2000 mL via INTRAVENOUS

## 2015-05-30 MED ORDER — OXYCODONE HCL 5 MG PO TABS
5.0000 mg | ORAL_TABLET | Freq: Once | ORAL | Status: AC
  Administered 2015-05-30: 5 mg via ORAL
  Filled 2015-05-30: qty 1

## 2015-05-30 NOTE — Discharge Instructions (Signed)
Diabetic Neuropathy Diabetic neuropathy is a nerve disease or nerve damage that is caused by diabetes mellitus. About half of all people with diabetes mellitus have some form of nerve damage. Nerve damage is more common in those who have had diabetes mellitus for many years and who generally have not had good control of their blood sugar (glucose) level. Diabetic neuropathy is a common complication of diabetes mellitus. There are three more common types of diabetic neuropathy and a fourth type that is less common and less understood:   Peripheral neuropathy--This is the most common type of diabetic neuropathy. It causes damage to the nerves of the feet and legs first and then eventually the hands and arms.The damage affects the ability to sense touch.  Autonomic neuropathy--This type causes damage to the autonomic nervous system, which controls the following functions:  Heartbeat.  Body temperature.  Blood pressure.  Urination.  Digestion.  Sweating.  Sexual function.  Focal neuropathy--Focal neuropathy can be painful and unpredictable and occurs most often in older adults with diabetes mellitus. It involves a specific nerve or one area and often comes on suddenly. It usually does not cause long-term problems.  Radiculoplexus neuropathy-- Sometimes called lumbosacral radiculoplexus neuropathy, radiculoplexus neuropathy affects the nerves of the thighs, hips, buttocks, or legs. It is more common in people with type 2 diabetes mellitus and in older men. It is characterized by debilitating pain, weakness, and atrophy, usually in the thigh muscles. CAUSES  The cause of peripheral, autonomic, and focal neuropathies is diabetes mellitus that is uncontrolled and high glucose levels. The cause of radiculoplexus neuropathy is unknown. However, it is thought to be caused by inflammation related to uncontrolled glucose levels. SIGNS AND SYMPTOMS  Peripheral Neuropathy Peripheral neuropathy develops  slowly over time. When the nerves of the feet and legs no longer work there may be:   Burning, stabbing, or aching pain in the legs or feet.  Inability to feel pressure or pain in your feet. This can lead to:  Thick calluses over pressure areas.  Pressure sores.  Ulcers.  Foot deformities.  Reduced ability to feel temperature changes.  Muscle weakness. Autonomic Neuropathy The symptoms of autonomic neuropathy vary depending on which nerves are affected. Symptoms may include:  Problems with digestion, such as:  Feeling sick to your stomach (nausea).  Vomiting.  Bloating.  Constipation.  Diarrhea.  Abdominal pain.  Difficulty with urination. This occurs if you lose your ability to sense when your bladder is full. Problems include:  Urine leakage (incontinence).  Inability to empty your bladder completely (retention).  Rapid or irregular heartbeat (palpitations).  Blood pressure drops when you stand up (orthostatic hypotension). When you stand up you may feel:  Dizzy.  Weak.  Faint.  In men, inability to attain and maintain an erection.  In women, vaginal dryness and problems with decreased sexual desire and arousal.  Problems with body temperature regulation.  Increased or decreased sweating. Focal Neuropathy  Abnormal eye movements or abnormal alignment of both eyes.  Weakness in the wrist.  Foot drop. This results in an inability to lift the foot properly and abnormal walking or foot movement.  Paralysis on one side of your face (Bell palsy).  Chest or abdominal pain. Radiculoplexus Neuropathy  Sudden, severe pain in your hip, thigh, or buttocks.  Weakness and wasting of thigh muscles.  Difficulty rising from a seated position.  Abdominal swelling.  Unexplained weight loss (usually more than 10 lb [4.5 kg]). DIAGNOSIS  Peripheral Neuropathy Your senses may  be tested. Sensory function testing can be done with:  A light touch using a  monofilament.  A vibration with tuning fork.  A sharp sensation with a pin prick. Other tests that can help diagnose neuropathy are:  Nerve conduction velocity. This test checks the transmission of an electrical current through a nerve.  Electromyography. This shows how muscles respond to electrical signals transmitted by nearby nerves.  Quantitative sensory testing. This is used to assess how your nerves respond to vibrations and changes in temperature. Autonomic Neuropathy Diagnosis is often based on reported symptoms. Tell your health care provider if you experience:   Dizziness.   Constipation.   Diarrhea.   Inappropriate urination or inability to urinate.   Inability to get or maintain an erection.  Tests that may be done include:   Electrocardiography or Holter monitor. These are tests that can help show problems with the heart rate or heart rhythm.   An X-ray exam may be done. Focal Neuropathy Diagnosis is made based on your symptoms and what your health care provider finds during your exam. Other tests may be done. They may include:  Nerve conduction velocities. This checks the transmission of electrical current through a nerve.  Electromyography. This shows how muscles respond to electrical signals transmitted by nearby nerves.  Quantitative sensory testing. This test is used to assess how your nerves respond to vibration and changes in temperature. Radiculoplexus Neuropathy  Often the first thing is to eliminate any other issue or problems that might be the cause, as there is no stick test for diagnosis.  X-ray exam of your spine and lumbar region.  Spinal tap to rule out cancer.  MRI to rule out other lesions. TREATMENT  Once nerve damage occurs, it cannot be reversed. The goal of treatment is to keep the disease or nerve damage from getting worse and affecting more nerve fibers. Controlling your blood glucose level is the key. Most people with  radiculoplexus neuropathy see at least a partial improvement over time. You will need to keep your blood glucose and HbA1c levels in the target range determined by your health care provider. Things that help control blood glucose levels include:   Blood glucose monitoring.   Meal planning.   Physical activity.   Diabetes medicine.  Over time, maintaining lower blood glucose levels helps lessen symptoms. Sometimes, prescription pain medicine is needed. HOME CARE INSTRUCTIONS:  Do not smoke.  Keep your blood glucose level in the range that you and your health care provider have determined acceptable for you.  Keep your blood pressure level in the range that you and your health care provider have determined acceptable for you.  Eat a well-balanced diet.  Be active every day.  Check your feet every day. SEEK MEDICAL CARE IF:   You have burning, stabbing, or aching pain in the legs or feet.  You are unable to feel pressure or pain in your feet.  You develop problems with digestion such as:  Nausea.  Vomiting.  Bloating.  Constipation.  Diarrhea.  Abdominal pain.  You have difficulty with urination, such as:  Incontinence.  Retention.  You have palpitations.  You develop orthostatic hypotension. When you stand up you may feel:  Dizzy.  Weak.  Faint.  You cannot attain and maintain an erection (in men).  You have vaginal dryness and problems with decreased sexual desire and arousal (in women).  You have severe pain in your thighs, legs, or buttocks.  You have unexplained weight loss.  Document Released: 12/06/2001 Document Revised: 07/18/2013 Document Reviewed: 03/08/2013 [facility] Patient Information 2015 [place], [place]. This information is not intended to replace advice given to you by your health care provider. Make sure you discuss any questions you have with your health care provider. Hyperglycemia Hyperglycemia occurs when the glucose (sugar) in  your blood is too high. Hyperglycemia can happen for many reasons, but it most often happens to people who do not know they have diabetes or are not managing their diabetes properly.  CAUSES  Whether you have diabetes or not, there are other causes of hyperglycemia. Hyperglycemia can occur when you have diabetes, but it can also occur in other situations that you might not be as aware of, such as: Diabetes  If you have diabetes and are having problems controlling your blood glucose, hyperglycemia could occur because of some of the following reasons:  Not following your meal plan.  Not taking your diabetes medications or not taking it properly.  Exercising less or doing less activity than you normally do.  Being sick. Pre-diabetes  This cannot be ignored. Before people develop Type 2 diabetes, they almost always have "pre-diabetes." This is when your blood glucose levels are higher than normal, but not yet high enough to be diagnosed as diabetes. Research has shown that some long-term damage to the body, especially the heart and circulatory system, may already be occurring during pre-diabetes. If you take action to manage your blood glucose when you have pre-diabetes, you may delay or prevent Type 2 diabetes from developing. Stress  If you have diabetes, you may be "diet" controlled or on oral medications or insulin to control your diabetes. However, you may find that your blood glucose is higher than usual in the hospital whether you have diabetes or not. This is often referred to as "stress hyperglycemia." Stress can elevate your blood glucose. This happens because of hormones put out by the body during times of stress. If stress has been the cause of your high blood glucose, it can be followed regularly by your caregiver. That way he/she can make sure your hyperglycemia does not continue to get worse or progress to diabetes. Steroids  Steroids are medications that act on the infection  fighting system (immune system) to block inflammation or infection. One side effect can be a rise in blood glucose. Most people can produce enough extra insulin to allow for this rise, but for those who cannot, steroids make blood glucose levels go even higher. It is not unusual for steroid treatments to "uncover" diabetes that is developing. It is not always possible to determine if the hyperglycemia will go away after the steroids are stopped. A special blood test called an A1c is sometimes done to determine if your blood glucose was elevated before the steroids were started. SYMPTOMS  Thirsty.  Frequent urination.  Dry mouth.  Blurred vision.  Tired or fatigue.  Weakness.  Sleepy.  Tingling in feet or leg. DIAGNOSIS  Diagnosis is made by monitoring blood glucose in one or all of the following ways:  A1c test. This is a chemical found in your blood.  Fingerstick blood glucose monitoring.  Laboratory results. TREATMENT  First, knowing the cause of the hyperglycemia is important before the hyperglycemia can be treated. Treatment may include, but is not be limited to:  Education.  Change or adjustment in medications.  Change or adjustment in meal plan.  Treatment for an illness, infection, etc.  More frequent blood glucose monitoring.  Change in exercise plan.  Decreasing or stopping steroids.  Lifestyle changes. HOME CARE INSTRUCTIONS   Test your blood glucose as directed.  Exercise regularly. Your caregiver will give you instructions about exercise. Pre-diabetes or diabetes which comes on with stress is helped by exercising.  Eat wholesome, balanced meals. Eat often and at regular, fixed times. Your caregiver or nutritionist will give you a meal plan to guide your sugar intake.  Being at an ideal weight is important. If needed, losing as little as 10 to 15 pounds may help improve blood glucose levels. SEEK MEDICAL CARE IF:   You have questions about medicine,  activity, or diet.  You continue to have symptoms (problems such as increased thirst, urination, or weight gain). SEEK IMMEDIATE MEDICAL CARE IF:   You are vomiting or have diarrhea.  Your breath smells fruity.  You are breathing faster or slower.  You are very sleepy or incoherent.  You have numbness, tingling, or pain in your feet or hands.  You have chest pain.  Your symptoms get worse even though you have been following your caregiver's orders.  If you have any other questions or concerns. Document Released: 03/23/2001 Document Revised: 12/20/2011 Document Reviewed: 01/24/2012 [facility] Patient Information 2015 [place], [place]. This information is not intended to replace advice given to you by your health care provider. Make sure you discuss any questions you have with your health care provider.

## 2015-05-30 NOTE — ED Notes (Addendum)
Per ems pt is from doctors office, L foot neuropathy pain, hx of diabetes, loss of sensation in toes. On and off x1 month, and has progressively getting worse. Ems reports the doctor walked out of visit and unsure what the situation is. Staff told ems they were unable to find a pulse in her left foot. Ems able to find a pulse.   Upon rn assessment, pt left toe started changing color, 2 weeks ago toe turned black and then pts left arm started hurting. pcp today thinks that the arm pain and foot pain are connected. And that toe may need to be removed. Pt reports her pcp is unable to prescribe anything but tramadol and that the ED might be the only place pt could get a prescription for something stronger. rn explained that she is unsure if pt will get a prescription for narcotics and that she needs to work with her pcp to get that issue resolved. Pain 9/10 at present.

## 2015-05-30 NOTE — ED Notes (Signed)
Called pt for labs in the waiting room, no answer

## 2015-05-30 NOTE — ED Provider Notes (Signed)
CSN: [id number]     Arrival date & time 05/30/15  1734 History   First MD Initiated Contact with Patient 05/30/15 1906     Chief Complaint  Patient presents with  . Foot Wound   . Foot Pain     (Consider location/radiation/quality/duration/timing/severity/associated sxs/prior Treatment) HPI Comments: Patient here complaining of worsening neuropathy of left arm and left leg times a month. Was seen by her doctor today due to some discoloration to her left foot. Denies any recent injury but does note some chronic numbness to the left foot is been there for some time. Pain in her arm has been persistent and worse with movement. Denies any anginal type quality. EMS was called and patient transported here. Patient also states that she has had trouble with her blood sugars and that she normally runs between 700-800. She endorses polyuria and polydipsia. Denies any weakness. States she's been compliant with her diabetic medication.  Patient is a 34 y.o. female presenting with lower extremity pain. The history is provided by the patient.  Foot Pain    Past Medical History  Diagnosis Date  . Hypertension   . Diabetes mellitus   . Hyperlipidemia   . Pregnancy complication     HELP Syndrome  . Complete miscarriage   . Bipolar affective   . Depression   . Schizophrenia   . Renal disorder    Past Surgical History  Procedure Laterality Date  . Cesarean section    . Cholecystectomy    . Tubal ligation     History reviewed. No pertinent family history. Social History  Substance Use Topics  . Smoking status: Current Every Day Smoker -- 0.50 packs/day    Types: Cigarettes  . Smokeless tobacco: Never Used  . Alcohol Use: No   OB History    No data available     Review of Systems  All other systems reviewed and are negative.     Allergies  Hydrocodone-acetaminophen and Tylenol  Home Medications   Prior to Admission medications   Medication Sig Start Date End Date Taking?  Authorizing Provider  ADVAIR DISKUS 100-50 MCG/DOSE AEPB Inhale 1 puff into the lungs daily. 01/10/15   Historical Provider, MD  ALPRAZolam Duanne Moron) 1 MG tablet Take 1 mg by mouth 3 (three) times daily.    Historical Provider, MD  ASPIRIN LOW DOSE 81 MG EC tablet Take 81 mg by mouth daily. 01/10/15   Historical Provider, MD  cyclobenzaprine (FLEXERIL) 10 MG tablet Take 1 tablet (10 mg total) by mouth 2 (two) times daily as needed for muscle spasms. Patient not taking: Reported on 02/26/2015 07/16/14   Elmyra Ricks Pisciotta, PA-C  gabapentin (NEURONTIN) 400 MG capsule Take 800 mg by mouth 3 (three) times daily. 02/13/15   Historical Provider, MD  HYDROcodone-acetaminophen (NORCO/VICODIN) 5-325 MG per tablet Take 2 tablets by mouth every 4 (four) hours as needed. Patient not taking: Reported on 02/26/2015 07/01/14   Comer Locket, PA-C  hydrOXYzine (VISTARIL) 50 MG capsule Take 50 mg by mouth 2 (two) times daily. 01/06/15   Historical Provider, MD  ibuprofen (ADVIL,MOTRIN) 800 MG tablet Take 1 tablet (800 mg total) by mouth 3 (three) times daily. Patient not taking: Reported on 02/26/2015 07/13/14   Domenic Moras, PA-C  lamoTRIgine (LAMICTAL) 150 MG tablet Take 150 mg by mouth daily. 02/12/15   Historical Provider, MD  LANTUS 100 UNIT/ML injection Inject 120 Units into the skin at bedtime. 01/10/15   Historical Provider, MD  metFORMIN (GLUCOPHAGE) 1000 MG tablet Take  1 tablet (1,000 mg total) by mouth 2 (two) times daily. 07/16/14   Nicole Pisciotta, PA-C  metoprolol tartrate (LOPRESSOR) 25 MG tablet Take 50 mg by mouth daily. 01/06/15   Historical Provider, MD  naproxen (NAPROSYN) 250 MG tablet Take 250 mg by mouth daily as needed for mild pain.  01/10/15   Historical Provider, MD  naproxen (NAPROSYN) 500 MG tablet Take 1 tablet (500 mg total) by mouth 2 (two) times daily. Patient not taking: Reported on 02/26/2015 07/01/14   Comer Locket, PA-C  naproxen (NAPROSYN) 500 MG tablet Take 0.5 tablets (250 mg total) by mouth 3  (three) times daily as needed for mild pain or moderate pain (TAKE WITH MEALS.). Patient not taking: Reported on 02/27/2015 02/26/15   Mercedes Camprubi-Soms, PA-C  NEXIUM 40 MG capsule Take 40 mg by mouth daily. 01/19/15   Historical Provider, MD  NOVOLOG 100 UNIT/ML injection Inject 15 Units into the skin daily. Sliding Scale:  >300 15 units < 300 do not use 01/10/15   Historical Provider, MD  omeprazole (PRILOSEC) 20 MG capsule Take 20 mg by mouth 2 (two) times daily. 12/18/14   Historical Provider, MD  oxyCODONE-acetaminophen (PERCOCET) 5-325 MG per tablet Take 1 tablet by mouth every 6 (six) hours as needed for severe pain. Patient not taking: Reported on 02/27/2015 02/26/15   Mercedes Camprubi-Soms, PA-C  oxyCODONE-acetaminophen (PERCOCET/ROXICET) 5-325 MG per tablet Take 1 tablet by mouth every 8 (eight) hours as needed for severe pain. Patient not taking: Reported on 02/26/2015 08/08/14   Montine Circle, PA-C  pantoprazole (PROTONIX) 40 MG tablet Take 40 mg by mouth daily. 01/10/15   Historical Provider, MD  penicillin v potassium (VEETID) 500 MG tablet Take 1 tablet (500 mg total) by mouth 3 (three) times daily. Patient not taking: Reported on 02/26/2015 07/13/14   Domenic Moras, PA-C  PROAIR HFA 108 (90 BASE) MCG/ACT inhaler Inhale 2 puffs into the lungs every 6 (six) hours as needed. 01/10/15   Historical Provider, MD  simvastatin (ZOCOR) 10 MG tablet Take 10 mg by mouth every evening.    Historical Provider, MD  sulfamethoxazole-trimethoprim (BACTRIM DS,SEPTRA DS) 800-160 MG per tablet Take 1 tablet by mouth 2 (two) times daily. Patient not taking: Reported on 02/27/2015 02/26/15   Mercedes Camprubi-Soms, PA-C  ziprasidone (GEODON) 40 MG capsule Take 40 mg by mouth daily. 02/13/15   Historical Provider, MD   BP 117/85 mmHg  Pulse 85  Temp(Src) 98.2 F (36.8 C) (Oral)  Resp 16  SpO2 100% Physical Exam  Constitutional: She is oriented to person, place, and time. She appears well-developed and  well-nourished.  Non-toxic appearance. No distress.  HENT:  Head: Normocephalic and atraumatic.  Eyes: Conjunctivae, EOM and lids are normal. Pupils are equal, round, and reactive to light.  Neck: Normal range of motion. Neck supple. No tracheal deviation present. No thyroid mass present.  Cardiovascular: Normal rate, regular rhythm and normal heart sounds.  Exam reveals no gallop.   No murmur heard. Pulmonary/Chest: Effort normal and breath sounds normal. No stridor. No respiratory distress. She has no decreased breath sounds. She has no wheezes. She has no rhonchi. She has no rales.  Abdominal: Soft. Normal appearance and bowel sounds are normal. She exhibits no distension. There is no tenderness. There is no rebound and no CVA tenderness.  Musculoskeletal: Normal range of motion. She exhibits no edema or tenderness.       Arms:      Feet:  Neurological: She is alert and oriented to  person, place, and time. She has normal strength. No cranial nerve deficit or sensory deficit. GCS eye subscore is 4. GCS verbal subscore is 5. GCS motor subscore is 6.  Skin: Skin is warm and dry. No abrasion and no rash noted.  Psychiatric: She has a normal mood and affect. Her speech is normal and behavior is normal.  Nursing note and vitals reviewed.   ED Course  Procedures (including critical care time) Labs Review Labs Reviewed  BASIC METABOLIC PANEL - Abnormal; Notable for the following:    Sodium 132 (*)    Chloride 99 (*)    Glucose, Bld 424 (*)    All other components within normal limits  CBC - Abnormal; Notable for the following:    WBC 10.6 (*)    All other components within normal limits  CBG MONITORING, ED - Abnormal; Notable for the following:    Glucose-Capillary 382 (*)    All other components within normal limits  CBG MONITORING, ED - Abnormal; Notable for the following:    Glucose-Capillary 427 (*)    All other components within normal limits  URINALYSIS, ROUTINE W REFLEX  MICROSCOPIC (NOT AT [facility])    Imaging Review No results found. I have personally reviewed and evaluated these images and lab results as part of my medical decision-making.   EKG Interpretation None      MDM   Final diagnoses:  None    Patient given oxycodone here for pain. Patient likely has diabetic foot. No evidence of ulceration at this time. Will be referred back to her doctor. Patient's hyperglycemia treated with IV fluids and has improved. She is encouraged to take her normal medications    Lacretia Leigh, MD 05/30/15 2039

## 2015-12-07 ENCOUNTER — Emergency Department (HOSPITAL_COMMUNITY): Payer: Medicaid Other

## 2015-12-07 ENCOUNTER — Emergency Department (HOSPITAL_COMMUNITY)
Admission: EM | Admit: 2015-12-07 | Discharge: 2015-12-08 | Discharge status: Against Medical Advice | Payer: Medicaid Other | Attending: Emergency Medicine | Admitting: Emergency Medicine

## 2015-12-07 ENCOUNTER — Encounter (HOSPITAL_COMMUNITY): Payer: Self-pay | Admitting: Emergency Medicine

## 2015-12-07 DIAGNOSIS — S79922A Unspecified injury of left thigh, initial encounter: Secondary | ICD-10-CM | POA: Diagnosis not present

## 2015-12-07 DIAGNOSIS — Z7984 Long term (current) use of oral hypoglycemic drugs: Secondary | ICD-10-CM | POA: Diagnosis not present

## 2015-12-07 DIAGNOSIS — E785 Hyperlipidemia, unspecified: Secondary | ICD-10-CM | POA: Diagnosis not present

## 2015-12-07 DIAGNOSIS — S99922A Unspecified injury of left foot, initial encounter: Secondary | ICD-10-CM | POA: Insufficient documentation

## 2015-12-07 DIAGNOSIS — S99912A Unspecified injury of left ankle, initial encounter: Secondary | ICD-10-CM | POA: Diagnosis not present

## 2015-12-07 DIAGNOSIS — F319 Bipolar disorder, unspecified: Secondary | ICD-10-CM | POA: Insufficient documentation

## 2015-12-07 DIAGNOSIS — S29001A Unspecified injury of muscle and tendon of front wall of thorax, initial encounter: Secondary | ICD-10-CM | POA: Insufficient documentation

## 2015-12-07 DIAGNOSIS — I10 Essential (primary) hypertension: Secondary | ICD-10-CM | POA: Diagnosis not present

## 2015-12-07 DIAGNOSIS — N390 Urinary tract infection, site not specified: Secondary | ICD-10-CM

## 2015-12-07 DIAGNOSIS — Z7982 Long term (current) use of aspirin: Secondary | ICD-10-CM | POA: Diagnosis not present

## 2015-12-07 DIAGNOSIS — S3992XA Unspecified injury of lower back, initial encounter: Secondary | ICD-10-CM | POA: Insufficient documentation

## 2015-12-07 DIAGNOSIS — S79912A Unspecified injury of left hip, initial encounter: Secondary | ICD-10-CM | POA: Diagnosis not present

## 2015-12-07 DIAGNOSIS — W182XXA Fall in (into) shower or empty bathtub, initial encounter: Secondary | ICD-10-CM | POA: Diagnosis not present

## 2015-12-07 DIAGNOSIS — Z79899 Other long term (current) drug therapy: Secondary | ICD-10-CM | POA: Insufficient documentation

## 2015-12-07 DIAGNOSIS — Y998 Other external cause status: Secondary | ICD-10-CM | POA: Insufficient documentation

## 2015-12-07 DIAGNOSIS — W19XXXA Unspecified fall, initial encounter: Secondary | ICD-10-CM

## 2015-12-07 DIAGNOSIS — Y9389 Activity, other specified: Secondary | ICD-10-CM | POA: Diagnosis not present

## 2015-12-07 DIAGNOSIS — Y9289 Other specified places as the place of occurrence of the external cause: Secondary | ICD-10-CM | POA: Insufficient documentation

## 2015-12-07 DIAGNOSIS — Z794 Long term (current) use of insulin: Secondary | ICD-10-CM | POA: Insufficient documentation

## 2015-12-07 DIAGNOSIS — Z3202 Encounter for pregnancy test, result negative: Secondary | ICD-10-CM | POA: Diagnosis not present

## 2015-12-07 DIAGNOSIS — F209 Schizophrenia, unspecified: Secondary | ICD-10-CM | POA: Diagnosis not present

## 2015-12-07 DIAGNOSIS — E119 Type 2 diabetes mellitus without complications: Secondary | ICD-10-CM | POA: Insufficient documentation

## 2015-12-07 DIAGNOSIS — F17210 Nicotine dependence, cigarettes, uncomplicated: Secondary | ICD-10-CM | POA: Diagnosis not present

## 2015-12-07 DIAGNOSIS — R32 Unspecified urinary incontinence: Secondary | ICD-10-CM | POA: Diagnosis present

## 2015-12-07 DIAGNOSIS — R339 Retention of urine, unspecified: Secondary | ICD-10-CM

## 2015-12-07 LAB — CBC WITH DIFFERENTIAL/PLATELET
Basophils Absolute: 0 10*3/uL (ref 0.0–0.1)
Basophils Relative: 0 %
EOS ABS: 0.5 10*3/uL (ref 0.0–0.7)
EOS PCT: 4 %
HCT: 40.1 % (ref 36.0–46.0)
Hemoglobin: 14.1 g/dL (ref 12.0–15.0)
LYMPHS ABS: 4.8 10*3/uL — AB (ref 0.7–4.0)
Lymphocytes Relative: 42 %
MCH: 32.3 pg (ref 26.0–34.0)
MCHC: 35.2 g/dL (ref 30.0–36.0)
MCV: 91.8 fL (ref 78.0–100.0)
MONO ABS: 0.6 10*3/uL (ref 0.1–1.0)
Monocytes Relative: 6 %
Neutro Abs: 5.4 10*3/uL (ref 1.7–7.7)
Neutrophils Relative %: 48 %
PLATELETS: 229 10*3/uL (ref 150–400)
RBC: 4.37 MIL/uL (ref 3.87–5.11)
RDW: 12.4 % (ref 11.5–15.5)
WBC: 11.3 10*3/uL — ABNORMAL HIGH (ref 4.0–10.5)

## 2015-12-07 LAB — I-STAT BETA HCG BLOOD, ED (MC, WL, AP ONLY)

## 2015-12-07 LAB — BASIC METABOLIC PANEL
Anion gap: 10 (ref 5–15)
BUN: 10 mg/dL (ref 6–20)
CHLORIDE: 102 mmol/L (ref 101–111)
CO2: 24 mmol/L (ref 22–32)
Calcium: 9.1 mg/dL (ref 8.9–10.3)
Creatinine, Ser: 1 mg/dL (ref 0.44–1.00)
GFR calc Af Amer: >60 mL/min (ref >60)
GFR calc non Af Amer: >60 mL/min (ref >60)
Glucose, Bld: 350 mg/dL — ABNORMAL HIGH (ref 65–99)
Potassium: 3.8 mmol/L (ref 3.5–5.1)
Sodium: 136 mmol/L (ref 135–145)

## 2015-12-07 LAB — CBG MONITORING, ED: Glucose-Capillary: 398 mg/dL — ABNORMAL HIGH (ref 65–99)

## 2015-12-07 LAB — POC URINE PREG, ED: Preg Test, Ur: NEGATIVE

## 2015-12-07 MED ORDER — FENTANYL CITRATE (PF) 100 MCG/2ML IJ SOLN
100.0000 ug | Freq: Once | INTRAMUSCULAR | Status: AC
  Administered 2015-12-07: 100 ug via INTRAVENOUS
  Filled 2015-12-07: qty 2

## 2015-12-07 MED ORDER — FENTANYL CITRATE (PF) 100 MCG/2ML IJ SOLN
100.0000 ug | Freq: Once | INTRAMUSCULAR | Status: AC
  Administered 2015-12-08: 100 ug via INTRAVENOUS
  Filled 2015-12-07: qty 2

## 2015-12-07 MED ORDER — SODIUM CHLORIDE 0.9 % IV BOLUS (SEPSIS)
1000.0000 mL | Freq: Once | INTRAVENOUS | Status: AC
  Administered 2015-12-07: 1000 mL via INTRAVENOUS

## 2015-12-07 NOTE — ED Notes (Signed)
Writer asked pt if she was able to urinate at this time, pt sts no, writer will performed a bladder scan.

## 2015-12-07 NOTE — ED Notes (Addendum)
Patient reports falling in shower last night. Reports pain to left hip, rib and leg. Also states that since fall last night she is now incontinent of urine. Ambulatory. AAO x4.

## 2015-12-07 NOTE — ED Provider Notes (Signed)
CSN: [id number]     Arrival date & time 12/07/15  2055 History   First MD Initiated Contact with Patient 12/07/15 2112     Chief Complaint  Patient presents with  . Fall  . Hip Pain  . Leg Pain     (Consider location/radiation/quality/duration/timing/severity/associated sxs/prior Treatment) HPI  35 year old female presents with left-sided pain after a fall last night in the shower. Patient states she was in the shower and felt lightheaded and dizzy for several minutes prior to passing out.is not sure if she hit her head but does not have a headache. Complains of pain from her left ribs all the way down to her foot. Patient is also complaining of urinary incontinence which is new. She states she has had many bladder problems before including having had a Foley catheter before. However she states she was unable to follow up with urology and had the Foley removed in the ER. She has had troubles urinating for quite some time but has never had actual incontinence. Is having back pain, left hip pain, left thigh and leg pain as well as foot and ankle pain. Feels numbness from her knee down to her foot. No bowel incontinence. No abdominal pain. States her glucose is always high, not different recently.  Past Medical History  Diagnosis Date  . Hypertension   . Diabetes mellitus   . Hyperlipidemia   . Pregnancy complication     HELP Syndrome  . Complete miscarriage   . Bipolar affective ([place])   . Depression   . Schizophrenia ([place])   . Renal disorder    Past Surgical History  Procedure Laterality Date  . Cesarean section    . Cholecystectomy    . Tubal ligation     No family history on file. Social History  Substance Use Topics  . Smoking status: Current Every Day Smoker -- 0.50 packs/day    Types: Cigarettes  . Smokeless tobacco: Never Used  . Alcohol Use: No   OB History    No data available     Review of Systems  Constitutional: Negative for fever.  Respiratory: Negative for  shortness of breath.   Cardiovascular: Positive for chest pain.  Gastrointestinal: Negative for vomiting, abdominal pain and diarrhea.  Musculoskeletal: Positive for back pain and arthralgias.  Neurological: Positive for syncope and numbness. Negative for weakness and headaches.  All other systems reviewed and are negative.     Allergies  Hydrocodone-acetaminophen and Tylenol  Home Medications   Prior to Admission medications   Medication Sig Start Date End Date Taking? Authorizing Provider  ADVAIR DISKUS 100-50 MCG/DOSE AEPB Inhale 1 puff into the lungs daily. 01/10/15  Yes Historical Provider, MD  ASPIRIN LOW DOSE 81 MG EC tablet Take 81 mg by mouth daily. 01/10/15  Yes Historical Provider, MD  clonazePAM (KLONOPIN) 0.5 MG tablet Take 0.5 mg by mouth 4 (four) times daily. 11/26/15  Yes Historical Provider, MD  gabapentin (NEURONTIN) 800 MG tablet Take 800 mg by mouth 5 (five) times daily. 11/26/15  Yes Historical Provider, MD  hydrOXYzine (VISTARIL) 50 MG capsule Take 50 mg by mouth daily.  01/06/15  Yes Historical Provider, MD  lamoTRIgine (LAMICTAL) 150 MG tablet Take 150 mg by mouth 2 (two) times daily.  02/12/15  Yes Historical Provider, MD  LANTUS 100 UNIT/ML injection Inject 15 Units into the skin 2 (two) times daily.  01/10/15  Yes Historical Provider, MD  LATUDA 60 MG TABS Take 60 mg by mouth daily. 11/26/15  Yes  Historical Provider, MD  metFORMIN (GLUCOPHAGE) 1000 MG tablet Take 1 tablet (1,000 mg total) by mouth 2 (two) times daily. Patient taking differently: Take 1,500 mg by mouth 2 (two) times daily.  07/16/14  Yes Nicole Pisciotta, PA-C  metoprolol tartrate (LOPRESSOR) 25 MG tablet Take 25 mg by mouth daily.  01/06/15  Yes Historical Provider, MD  NEXIUM 40 MG capsule Take 40 mg by mouth daily. 01/19/15  Yes Historical Provider, MD  NOVOLOG 100 UNIT/ML injection Inject 15 Units into the skin 2 (two) times daily. Sliding Scale:  >300 15 units < 300 do not use 01/10/15  Yes Historical  Provider, MD  omeprazole (PRILOSEC) 20 MG capsule Take 20 mg by mouth daily.  12/18/14  Yes Historical Provider, MD  simvastatin (ZOCOR) 10 MG tablet Take 10 mg by mouth every morning.    Yes Historical Provider, MD  ziprasidone (GEODON) 40 MG capsule Take 40 mg by mouth every morning.  02/13/15  Yes Historical Provider, MD  cephALEXin (KEFLEX) 500 MG capsule Take 1 capsule (500 mg total) by mouth 4 (four) times daily. 12/08/15   Sherwood Gambler, MD  PROAIR HFA 108 (90 BASE) MCG/ACT inhaler Inhale 2 puffs into the lungs every 6 (six) hours as needed. 01/10/15   Historical Provider, MD   BP 130/106 mmHg  Pulse 88  Temp(Src) 97.7 F (36.5 C) (Oral)  Resp 17  SpO2 99%  LMP 11/23/2015 Physical Exam  Constitutional: She is oriented to person, place, and time. She appears well-developed and well-nourished.  HENT:  Head: Normocephalic and atraumatic.  Right Ear: External ear normal.  Left Ear: External ear normal.  Nose: Nose normal.  Eyes: Right eye exhibits no discharge. Left eye exhibits no discharge.  Cardiovascular: Normal rate, regular rhythm and normal heart sounds.   Pulses:      Dorsalis pedis pulses are 2+ on the right side, and 2+ on the left side.  Pulmonary/Chest: Effort normal and breath sounds normal. She exhibits tenderness.    Abdominal: Soft. She exhibits no distension. There is no tenderness.  Genitourinary:  Subjective decreased peri-anal sensation but normal rectal tone  Musculoskeletal:       Left hip: She exhibits tenderness (lateral and over iliac crest).       Lumbar back: She exhibits tenderness.  No swelling or focal tenderness in LLE when distracted. No joint swelling. Nonspecific generalized pain to LLE  Neurological: She is alert and oriented to person, place, and time. She displays no Babinski's sign on the right side. She displays no Babinski's sign on the left side.  Reflex Scores:      Patellar reflexes are 2+ on the right side and 2+ on the left side.       Achilles reflexes are 2+ on the right side and 2+ on the left side. Subjective decreased sensation in left lateral lower leg/foot. Foot/leg jumps back during babinski test. Some weakness in LLE but this seems to be due to pain  Skin: Skin is warm and dry.  Nursing note and vitals reviewed.   ED Course  Procedures (including critical care time) Labs Review Labs Reviewed  URINALYSIS, ROUTINE W REFLEX MICROSCOPIC (NOT AT [facility]) - Abnormal; Notable for the following:    Glucose, UA >1000 (*)    Nitrite POSITIVE (*)    All other components within normal limits  BASIC METABOLIC PANEL - Abnormal; Notable for the following:    Glucose, Bld 350 (*)    All other components within normal limits  CBC  WITH DIFFERENTIAL/PLATELET - Abnormal; Notable for the following:    WBC 11.3 (*)    Lymphs Abs 4.8 (*)    All other components within normal limits  URINE MICROSCOPIC-ADD ON - Abnormal; Notable for the following:    Squamous Epithelial / LPF 0-5 (*)    Bacteria, UA MANY (*)    All other components within normal limits  CBG MONITORING, ED - Abnormal; Notable for the following:    Glucose-Capillary 398 (*)    All other components within normal limits  URINE CULTURE  POC URINE PREG, ED  I-STAT BETA HCG BLOOD, ED ([facility], [facility], [facility] ONLY)  I-STAT BETA HCG BLOOD, ED ([facility], [facility], [facility] ONLY)    Imaging Review Dg Ribs Unilateral W/chest Left  12/08/2015  CLINICAL DATA:  35 year old female with fall and lower back pain. Patient also reports diffuse rib pain. EXAM: RIGHT RIBS AND CHEST - 3+ VIEW COMPARISON:  Radiograph dated 02/26/2015 FINDINGS: No fracture or other bone lesions are seen involving the ribs. There is no evidence of pneumothorax or pleural effusion. Both lungs are clear. Heart size and mediastinal contours are within normal limits. IMPRESSION: No evidence of rib fractures.  No pneumothorax. Electronically Signed   By: Anner Crete M.D.   On: 12/08/2015 00:53   Dg Lumbar Spine Complete  12/08/2015   CLINICAL DATA:  Status post fall in shower, with lower back pain. Initial encounter. EXAM: LUMBAR SPINE - COMPLETE 4+ VIEW COMPARISON:  CT of the lumbar spine performed 07/01/2014 FINDINGS: There is no evidence of fracture or subluxation. Vertebral bodies demonstrate normal height and alignment. Intervertebral disc spaces are preserved. The visualized neural foramina are grossly unremarkable in appearance. The visualized bowel gas pattern is unremarkable in appearance; air and stool are noted within the colon. The sacroiliac joints are within normal limits. Clips are noted within the right upper quadrant, reflecting prior cholecystectomy. IMPRESSION: No evidence of fracture or subluxation along the lumbar spine. Electronically Signed   By: Garald Balding M.D.   On: 12/08/2015 00:49   Dg Tibia/fibula Left  12/08/2015  CLINICAL DATA:  Status post fall in shower, with left leg pain. Initial encounter. EXAM: LEFT TIBIA AND FIBULA - 2 VIEW COMPARISON:  None. FINDINGS: The left tibia and fibula appear intact. There is no evidence of fracture or dislocation. Visualized joint spaces are preserved. The knee joint is grossly unremarkable. The ankle mortise is grossly unremarkable, though incompletely assessed. No definite soft tissue abnormalities are characterized on radiograph. IMPRESSION: No evidence of fracture or dislocation. Electronically Signed   By: Garald Balding M.D.   On: 12/08/2015 00:44   Dg Foot Complete Left  12/08/2015  CLINICAL DATA:  Status post fall in shower, with left foot pain. Initial encounter. EXAM: LEFT FOOT - COMPLETE 3+ VIEW COMPARISON:  Left foot radiographs performed 07/03/2015 FINDINGS: There is no evidence of fracture or dislocation. The joint spaces are preserved. There is no evidence of talar subluxation; the subtalar joint is unremarkable in appearance. No significant soft tissue abnormalities are seen. IMPRESSION: No evidence of fracture or dislocation. Electronically Signed   By:  Garald Balding M.D.   On: 12/08/2015 00:48   Dg Hip Unilat With Pelvis 2-3 Views Left  12/08/2015  CLINICAL DATA:  Status post fall in shower, with left hip and lower back pain. Initial encounter. EXAM: DG HIP (WITH OR WITHOUT PELVIS) 2-3V LEFT COMPARISON:  None. FINDINGS: There is no evidence of fracture or dislocation. Both femoral heads are seated normally within their  respective acetabula. The proximal left femur appears intact. No significant degenerative change is appreciated. The sacroiliac joints are unremarkable in appearance. The visualized bowel gas pattern is grossly unremarkable in appearance. Scattered phleboliths are noted within the pelvis. IMPRESSION: No evidence of fracture or dislocation. Electronically Signed   By: Garald Balding M.D.   On: 12/08/2015 00:43   Dg Femur Min 2 Views Left  12/08/2015  CLINICAL DATA:  Status post fall in shower, with left hip pain. Initial encounter. EXAM: LEFT FEMUR 2 VIEWS COMPARISON:  None. FINDINGS: The left femur appears intact. There is no evidence of fracture or dislocation. The left femoral head remains seated at the acetabulum. The left knee joint is unremarkable in appearance. No knee joint effusion is identified. No definite soft tissue abnormalities are characterized on radiograph. IMPRESSION: No evidence of fracture or dislocation. Electronically Signed   By: Garald Balding M.D.   On: 12/08/2015 00:45   I have personally reviewed and evaluated these images and lab results as part of my medical decision-making.   EKG Interpretation   Date/Time:  Sunday December 07 2015 21:47:29 EST Ventricular Rate:  105 PR Interval:  133 QRS Duration: 85 QT Interval:  379 QTC Calculation: 501 R Axis:   62 Text Interpretation:  Sinus tachycardia Ventricular bigeminy Low voltage,  precordial leads Borderline repolarization abnormality Confirmed by  Montrel Donahoe  MD, Garek Schuneman ([id number]) on 12/07/2015 11:27:58 PM      MDM   Final diagnoses:  Urinary retention   Fall, initial encounter  UTI (lower urinary tract infection)    Patient with lightheadedness and subsequent syncope 24 hours ago with a fall and left-sided injury. No fracture seen on x-rays. She has urinary retention on  Bladder scan, she is also refusing to attempt to urinate and initially refused the bladder scan. She has had urinary retention multiple times before side.the business early new. However her rectal exam she reports decreased sensation and is complaining of numbness in her left lower leg. Due to this I recommended an MRI although I think spinal cord injury or epidural hematoma is less likely. At this time of night I'm unable to get an MRI and thus she was going to be transferred to [facility] for an MRI of her lumbar spine. Initially she agreed to go but then later has changed her mind and is declining transfer to [facility]. She states due to cost her significant other can not afford to drive to [facility] and still get her back home (not enough gas). Patient understands that if she has a spinal cord injury that time is of the essence and that delays could lead to permanent paralysis. She still wants to be discharged. She states she will try and come back in the morning for an MRI. Discussed she can come back at any time but at this point she is leaving [place] and seems to understand my concerns and risks of leaving. She does have positive nitrates in her urine and thus will be given Keflex for possible UTI. Will leave Foley catheter in and refer her to urology.    Sherwood Gambler, MD 12/08/15 [phone]

## 2015-12-08 LAB — URINE MICROSCOPIC-ADD ON: RBC / HPF: NONE SEEN RBC/hpf (ref 0–5)

## 2015-12-08 LAB — URINALYSIS, ROUTINE W REFLEX MICROSCOPIC
Bilirubin Urine: NEGATIVE
HGB URINE DIPSTICK: NEGATIVE
KETONES UR: NEGATIVE mg/dL
Leukocytes, UA: NEGATIVE
Nitrite: POSITIVE — AB
Protein, ur: NEGATIVE mg/dL
Specific Gravity, Urine: 1.028 (ref 1.005–1.030)
pH: 5.5 (ref 5.0–8.0)

## 2015-12-08 MED ORDER — CEPHALEXIN 500 MG PO CAPS: 500.0000 mg | ORAL_CAPSULE | Freq: Four times a day (QID) | ORAL | Status: DC

## 2015-12-08 NOTE — Discharge Instructions (Signed)
We discussed you needing an MRI to rule out a spinal cord problem from your fall that could result in permanent nerve damage, paralysis or other acute complications including death. You are choosing not to get transferred to [facility] for the MRI currently, and if you change your mind you may come back here or to the [facility] ER and receive re-evaluation. Please call [phone] if any symptoms worsen. Leave your foley catheter in until you are seen by the urologist.

## 2015-12-11 LAB — URINE CULTURE: Culture: >=100000

## 2015-12-12 ENCOUNTER — Telehealth (HOSPITAL_COMMUNITY): Payer: Self-pay

## 2015-12-12 NOTE — Telephone Encounter (Signed)
Post ED Visit - Positive Culture Follow-up  Culture report reviewed by antimicrobial stewardship pharmacist:  []  Elenor Quinones, Pharm.D. []  Heide Guile, Pharm.D., BCPS []  Parks Neptune, Pharm.D. []  Alycia Rossetti, Pharm.D., BCPS []  [place], Pharm.D., BCPS, AAHIVP [x]  Legrand Como, Pharm.D., BCPS, AAHIVP []  Milus Glazier, Pharm.D. []  Stephens November, Pharm.D.  Positive urine culture Treated with cephalexin , organism sensitive to the same and no further patient follow-up is required at this time.  Ileene Musa 12/12/2015, 4:42 PM

## 2016-03-11 ENCOUNTER — Encounter (HOSPITAL_COMMUNITY): Payer: Self-pay | Admitting: *Deleted

## 2016-03-11 ENCOUNTER — Emergency Department (HOSPITAL_COMMUNITY)
Admission: EM | Admit: 2016-03-11 | Discharge: 2016-03-11 | Disposition: A | Discharge status: Home / Self Care | Payer: Medicaid Other | Attending: Emergency Medicine | Admitting: Emergency Medicine

## 2016-03-11 ENCOUNTER — Emergency Department (HOSPITAL_COMMUNITY): Payer: Medicaid Other

## 2016-03-11 DIAGNOSIS — Z79899 Other long term (current) drug therapy: Secondary | ICD-10-CM | POA: Insufficient documentation

## 2016-03-11 DIAGNOSIS — E119 Type 2 diabetes mellitus without complications: Secondary | ICD-10-CM | POA: Diagnosis not present

## 2016-03-11 DIAGNOSIS — Z7982 Long term (current) use of aspirin: Secondary | ICD-10-CM | POA: Diagnosis not present

## 2016-03-11 DIAGNOSIS — Z7984 Long term (current) use of oral hypoglycemic drugs: Secondary | ICD-10-CM | POA: Diagnosis not present

## 2016-03-11 DIAGNOSIS — F319 Bipolar disorder, unspecified: Secondary | ICD-10-CM | POA: Insufficient documentation

## 2016-03-11 DIAGNOSIS — R079 Chest pain, unspecified: Secondary | ICD-10-CM | POA: Diagnosis present

## 2016-03-11 DIAGNOSIS — F17210 Nicotine dependence, cigarettes, uncomplicated: Secondary | ICD-10-CM | POA: Insufficient documentation

## 2016-03-11 DIAGNOSIS — E785 Hyperlipidemia, unspecified: Secondary | ICD-10-CM | POA: Insufficient documentation

## 2016-03-11 DIAGNOSIS — I10 Essential (primary) hypertension: Secondary | ICD-10-CM | POA: Insufficient documentation

## 2016-03-11 DIAGNOSIS — Z794 Long term (current) use of insulin: Secondary | ICD-10-CM | POA: Insufficient documentation

## 2016-03-11 DIAGNOSIS — R0789 Other chest pain: Secondary | ICD-10-CM | POA: Insufficient documentation

## 2016-03-11 LAB — CBC
HEMATOCRIT: 42.3 % (ref 36.0–46.0)
Hemoglobin: 15 g/dL (ref 12.0–15.0)
MCH: 31.1 pg (ref 26.0–34.0)
MCHC: 35.5 g/dL (ref 30.0–36.0)
MCV: 87.6 fL (ref 78.0–100.0)
PLATELETS: 215 10*3/uL (ref 150–400)
RBC: 4.83 MIL/uL (ref 3.87–5.11)
RDW: 12.1 % (ref 11.5–15.5)
WBC: 11.8 10*3/uL — AB (ref 4.0–10.5)

## 2016-03-11 LAB — BASIC METABOLIC PANEL
Anion gap: 10 (ref 5–15)
BUN: 12 mg/dL (ref 6–20)
CHLORIDE: 96 mmol/L — AB (ref 101–111)
CO2: 26 mmol/L (ref 22–32)
CREATININE: 1.11 mg/dL — AB (ref 0.44–1.00)
Calcium: 9.3 mg/dL (ref 8.9–10.3)
GFR calc Af Amer: >60 mL/min (ref >60)
GFR calc non Af Amer: >60 mL/min (ref >60)
GLUCOSE: 372 mg/dL — AB (ref 65–99)
POTASSIUM: 3.6 mmol/L (ref 3.5–5.1)
SODIUM: 132 mmol/L — AB (ref 135–145)

## 2016-03-11 LAB — I-STAT TROPONIN, ED: Troponin i, poc: 0 ng/mL (ref 0.00–0.08)

## 2016-03-11 MED ORDER — KETOROLAC TROMETHAMINE 30 MG/ML IJ SOLN
30.0000 mg | Freq: Once | INTRAMUSCULAR | Status: AC
  Administered 2016-03-11: 30 mg via INTRAVENOUS
  Filled 2016-03-11: qty 1

## 2016-03-11 MED ORDER — NAPROXEN 500 MG PO TABS: 500.0000 mg | ORAL_TABLET | Freq: Two times a day (BID) | ORAL | Status: DC

## 2016-03-11 MED ORDER — ASPIRIN 81 MG PO CHEW
243.0000 mg | CHEWABLE_TABLET | Freq: Once | ORAL | Status: AC
  Administered 2016-03-11: 243 mg via ORAL
  Filled 2016-03-11: qty 3

## 2016-03-11 MED ORDER — OXYCODONE HCL 5 MG PO TABS
5.0000 mg | ORAL_TABLET | Freq: Once | ORAL | Status: AC
  Administered 2016-03-11: 5 mg via ORAL
  Filled 2016-03-11: qty 1

## 2016-03-11 MED ORDER — OXYCODONE HCL 5 MG PO TABS: 5.0000 mg | ORAL_TABLET | ORAL | Status: DC | PRN

## 2016-03-11 NOTE — Discharge Instructions (Signed)
Chest Wall Pain Chest wall pain is pain in or around the bones and muscles of your chest. Sometimes, an injury causes this pain. Sometimes, the cause may not be known. This pain may take several weeks or longer to get better. HOME CARE INSTRUCTIONS  Pay attention to any changes in your symptoms. Take these actions to help with your pain:   Rest as told by your health care provider.   Avoid activities that cause pain. These include any activities that use your chest muscles or your abdominal and side muscles to lift heavy items.   If directed, apply ice to the painful area:  Put ice in a plastic bag.  Place a towel between your skin and the bag.  Leave the ice on for 20 minutes, 2-3 times per day.  Take over-the-counter and prescription medicines only as told by your health care provider.  Do not use tobacco products, including cigarettes, chewing tobacco, and e-cigarettes. If you need help quitting, ask your health care provider.  Keep all follow-up visits as told by your health care provider. This is important. SEEK MEDICAL CARE IF:  You have a fever.  Your chest pain becomes worse.  You have new symptoms. SEEK IMMEDIATE MEDICAL CARE IF:  You have nausea or vomiting.  You feel sweaty or light-headed.  You have a cough with phlegm (sputum) or you cough up blood.  You develop shortness of breath.   This information is not intended to replace advice given to you by your health care provider. Make sure you discuss any questions you have with your health care provider.   Document Released: 09/27/2005 Document Revised: 06/18/2015 Document Reviewed: 12/23/2014 Elsevier Interactive Patient Education 2016 Elsevier Inc.  Naproxen and naproxen sodium oral immediate-release tablets What is this medicine? NAPROXEN (na PROX en) is a non-steroidal anti-inflammatory drug (NSAID). It is used to reduce swelling and to treat pain. This medicine may be used for dental pain, headache,  or painful monthly periods. It is also used for painful joint and muscular problems such as arthritis, tendinitis, bursitis, and gout. This medicine may be used for other purposes; ask your health care provider or pharmacist if you have questions. What should I tell my health care provider before I take this medicine? They need to know if you have any of these conditions: -asthma -cigarette smoker -drink more than 3 alcohol containing drinks a day -heart disease or circulation problems such as heart failure or leg edema (fluid retention) -high blood pressure -kidney disease -liver disease -stomach bleeding or ulcers -an unusual or allergic reaction to naproxen, aspirin, other NSAIDs, other medicines, foods, dyes, or preservatives -pregnant or trying to get pregnant -breast-feeding How should I use this medicine? Take this medicine by mouth with a glass of water. Follow the directions on the prescription label. Take it with food if your stomach gets upset. Try to not lie down for at least 10 minutes after you take it. Take your medicine at regular intervals. Do not take your medicine more often than directed. Long-term, continuous use may increase the risk of heart attack or stroke. A special MedGuide will be given to you by the pharmacist with each prescription and refill. Be sure to read this information carefully each time. Talk to your pediatrician regarding the use of this medicine in children. Special care may be needed. Overdosage: If you think you have taken too much of this medicine contact a poison control center or emergency room at once. NOTE: This medicine is  only for you. Do not share this medicine with others. What if I miss a dose? If you miss a dose, take it as soon as you can. If it is almost time for your next dose, take only that dose. Do not take double or extra doses. What may interact with this  medicine? -alcohol -aspirin -cidofovir -diuretics -lithium -methotrexate -other drugs for inflammation like ketorolac or prednisone -pemetrexed -probenecid -warfarin This list may not describe all possible interactions. Give your health care provider a list of all the medicines, herbs, non-prescription drugs, or dietary supplements you use. Also tell them if you smoke, drink alcohol, or use illegal drugs. Some items may interact with your medicine. What should I watch for while using this medicine? Tell your doctor or health care professional if your pain does not get better. Talk to your doctor before taking another medicine for pain. Do not treat yourself. This medicine does not prevent heart attack or stroke. In fact, this medicine may increase the chance of a heart attack or stroke. The chance may increase with longer use of this medicine and in people who have heart disease. If you take aspirin to prevent heart attack or stroke, talk with your doctor or health care professional. Do not take other medicines that contain aspirin, ibuprofen, or naproxen with this medicine. Side effects such as stomach upset, nausea, or ulcers may be more likely to occur. Many medicines available without a prescription should not be taken with this medicine. This medicine can cause ulcers and bleeding in the stomach and intestines at any time during treatment. Do not smoke cigarettes or drink alcohol. These increase irritation to your stomach and can make it more susceptible to damage from this medicine. Ulcers and bleeding can happen without warning symptoms and can cause death. You may get drowsy or dizzy. Do not drive, use machinery, or do anything that needs mental alertness until you know how this medicine affects you. Do not stand or sit up quickly, especially if you are an older patient. This reduces the risk of dizzy or fainting spells. This medicine can cause you to bleed more easily. Try to avoid damage  to your teeth and gums when you brush or floss your teeth. What side effects may I notice from receiving this medicine? Side effects that you should report to your doctor or health care professional as soon as possible: -black or bloody stools, blood in the urine or vomit -blurred vision -chest pain -difficulty breathing or wheezing -nausea or vomiting -severe stomach pain -skin rash, skin redness, blistering or peeling skin, hives, or itching -slurred speech or weakness on one side of the body -swelling of eyelids, throat, lips -unexplained weight gain or swelling -unusually weak or tired -yellowing of eyes or skin Side effects that usually do not require medical attention (report to your doctor or health care professional if they continue or are bothersome): -constipation -headache -heartburn This list may not describe all possible side effects. Call your doctor for medical advice about side effects. You may report side effects to FDA at 1-800-FDA-1088. Where should I keep my medicine? Keep out of the reach of children. Store at room temperature between 15 and 30 degrees C (59 and 86 degrees F). Keep container tightly closed. Throw away any unused medicine after the expiration date. NOTE: This sheet is a summary. It may not cover all possible information. If you have questions about this medicine, talk to your doctor, pharmacist, or health care provider.  2016, Elsevier/Gold Standard. (2009-09-29 20:10:16)  Oxycodone tablets or capsules What is this medicine? OXYCODONE (ox i KOE done) is a pain reliever. It is used to treat moderate to severe pain. This medicine may be used for other purposes; ask your health care provider or pharmacist if you have questions. What should I tell my health care provider before I take this medicine? They need to know if you have any of these conditions: -Addison's disease -brain tumor -head injury -heart disease -history of drug or alcohol  abuse problem -if you often drink alcohol -kidney disease -liver disease -lung or breathing disease, like asthma -mental illness -pancreatic disease -seizures -thyroid disease -an unusual or allergic reaction to oxycodone, codeine, hydrocodone, morphine, other medicines, foods, dyes, or preservatives -pregnant or trying to get pregnant -breast-feeding How should I use this medicine? Take this medicine by mouth with a glass of water. Follow the directions on the prescription label. You can take it with or without food. If it upsets your stomach, take it with food. Take your medicine at regular intervals. Do not take it more often than directed. Do not stop taking except on your doctor's advice. Some brands of this medicine, like Oxecta, have special instructions. Ask your doctor or pharmacist if these directions are for you: Do not cut, crush or chew this medicine. Swallow only one tablet at a time. Do not wet, soak, or lick the tablet before you take it. Talk to your pediatrician regarding the use of this medicine in children. Special care may be needed. Overdosage: If you think you have taken too much of this medicine contact a poison control center or emergency room at once. NOTE: This medicine is only for you. Do not share this medicine with others. What if I miss a dose? If you miss a dose, take it as soon as you can. If it is almost time for your next dose, take only that dose. Do not take double or extra doses. What may interact with this medicine? -alcohol -antihistamines -certain medicines used for nausea like chlorpromazine, droperidol -erythromycin -ketoconazole -medicines for depression, anxiety, or psychotic disturbances -medicines for sleep -muscle relaxants -naloxone -naltrexone -narcotic medicines (opiates) for pain -nilotinib -phenobarbital -phenytoin -rifampin -ritonavir -voriconazole This list may not describe all possible interactions. Give your health care  provider a list of all the medicines, herbs, non-prescription drugs, or dietary supplements you use. Also tell them if you smoke, drink alcohol, or use illegal drugs. Some items may interact with your medicine. What should I watch for while using this medicine? Tell your doctor or health care professional if your pain does not go away, if it gets worse, or if you have new or a different type of pain. You may develop tolerance to the medicine. Tolerance means that you will need a higher dose of the medicine for pain relief. Tolerance is normal and is expected if you take this medicine for a long time. Do not suddenly stop taking your medicine because you may develop a severe reaction. Your body becomes used to the medicine. This does NOT mean you are addicted. Addiction is a behavior related to getting and using a drug for a non-medical reason. If you have pain, you have a medical reason to take pain medicine. Your doctor will tell you how much medicine to take. If your doctor wants you to stop the medicine, the dose will be slowly lowered over time to avoid any side effects. You may get drowsy or dizzy when you first  start taking this medicine or change doses. Do not drive, use machinery, or do anything that may be dangerous until you know how the medicine affects you. Stand or sit up slowly. There are different types of narcotic medicines (opiates) for pain. If you take more than one type at the same time, you may have more side effects. Give your health care provider a list of all medicines you use. Your doctor will tell you how much medicine to take. Do not take more medicine than directed. Call emergency for help if you have problems breathing. This medicine will cause constipation. Try to have a bowel movement at least every 2 to 3 days. If you do not have a bowel movement for 3 days, call your doctor or health care professional. Your mouth may get dry. Drinking water, chewing sugarless gum, or sucking  on hard candy may help. See your dentist every 6 months. What side effects may I notice from receiving this medicine? Side effects that you should report to your doctor or health care professional as soon as possible: -allergic reactions like skin rash, itching or hives, swelling of the face, lips, or tongue -breathing problems -confusion -feeling faint or lightheaded, falls -trouble passing urine or change in the amount of urine -unusually weak or tired Side effects that usually do not require medical attention (report to your doctor or health care professional if they continue or are bothersome): -constipation -dry mouth -itching -nausea, vomiting -upset stomach This list may not describe all possible side effects. Call your doctor for medical advice about side effects. You may report side effects to FDA at 1-800-FDA-1088. Where should I keep my medicine? Keep out of the reach of children. This medicine can be abused. Keep your medicine in a safe place to protect it from theft. Do not share this medicine with anyone. Selling or giving away this medicine is dangerous and against the law. Store at room temperature between 15 and 30 degrees C (59 and 86 degrees F). Protect from light. Keep container tightly closed. This medicine may cause accidental overdose and death if it is taken by other adults, children, or pets. Flush any unused medicine down the toilet to reduce the chance of harm. Do not use the medicine after the expiration date. NOTE: This sheet is a summary. It may not cover all possible information. If you have questions about this medicine, talk to your doctor, pharmacist, or health care provider.    2016, Elsevier/Gold Standard. (2015-02-08 01:15:14)

## 2016-03-11 NOTE — ED Notes (Signed)
Pt c/o left sided CP x 30 mins that radiates to left arm.

## 2016-03-11 NOTE — ED Provider Notes (Signed)
CSN: [id number]     Arrival date & time 03/11/16  0100 History  By signing my name below, I, Kathy Phelps, attest that this documentation has been prepared under the direction and in the presence of Kathy Fuel, MD . Electronically Signed: Higinio Phelps, Scribe. 03/11/2016. 1:53 AM.   Chief Complaint  Patient presents with  . Chest Pain   The history is provided by the patient. No language interpreter was used.   HPI Comments:  Kathy Phelps is a 35 y.o. female with PMHx of HTN, DM, and HLD, who presents to the Emergency Department complaining of 8/10, sudden onset, gradually worsening, left sided chest pain that began about 1.5 hours PTA. Pt notes an associated symptom of diaphoresis, shortness of breath, and dyspnea. She describes her pain as a brick lying on top of her chest. Pt states that she was lying down when her chest pain first started. She reports that she has taken 81 mg Aspirin to alleviate her symptoms with no relief. Pt notes that this is the first time she has experienced this type of pain. She denies nausea, vomiting, and diarrhea. No alleviating factors noted.  Past Medical History  Diagnosis Date  . Hypertension   . Diabetes mellitus   . Hyperlipidemia   . Pregnancy complication     HELP Syndrome  . Complete miscarriage   . Bipolar affective ([place])   . Depression   . Schizophrenia ([place])   . Renal disorder    Past Surgical History  Procedure Laterality Date  . Cesarean section    . Cholecystectomy    . Tubal ligation     No family history on file. Social History  Substance Use Topics  . Smoking status: Current Every Day Smoker -- 0.50 packs/day    Types: Cigarettes  . Smokeless tobacco: Never Used  . Alcohol Use: No   OB History    No data available     Review of Systems  Constitutional: Positive for diaphoresis.  Respiratory: Negative for shortness of breath.   Cardiovascular: Positive for chest pain.  Gastrointestinal: Negative for nausea, vomiting and  diarrhea.  All other systems reviewed and are negative.     Allergies  Hydrocodone-acetaminophen and Tylenol  Home Medications   Prior to Admission medications   Medication Sig Start Date End Date Taking? Authorizing Provider  ADVAIR DISKUS 100-50 MCG/DOSE AEPB Inhale 1 puff into the lungs daily. 01/10/15   Historical Provider, MD  ASPIRIN LOW DOSE 81 MG EC tablet Take 81 mg by mouth daily. 01/10/15   Historical Provider, MD  cephALEXin (KEFLEX) 500 MG capsule Take 1 capsule (500 mg total) by mouth 4 (four) times daily. 12/08/15   Sherwood Gambler, MD  clonazePAM (KLONOPIN) 0.5 MG tablet Take 0.5 mg by mouth 4 (four) times daily. 11/26/15   Historical Provider, MD  gabapentin (NEURONTIN) 800 MG tablet Take 800 mg by mouth 5 (five) times daily. 11/26/15   Historical Provider, MD  hydrOXYzine (VISTARIL) 50 MG capsule Take 50 mg by mouth daily.  01/06/15   Historical Provider, MD  lamoTRIgine (LAMICTAL) 150 MG tablet Take 150 mg by mouth 2 (two) times daily.  02/12/15   Historical Provider, MD  LANTUS 100 UNIT/ML injection Inject 15 Units into the skin 2 (two) times daily.  01/10/15   Historical Provider, MD  LATUDA 60 MG TABS Take 60 mg by mouth daily. 11/26/15   Historical Provider, MD  metFORMIN (GLUCOPHAGE) 1000 MG tablet Take 1 tablet (1,000 mg total) by mouth  2 (two) times daily. Patient taking differently: Take 1,500 mg by mouth 2 (two) times daily.  07/16/14   Nicole Pisciotta, PA-C  metoprolol tartrate (LOPRESSOR) 25 MG tablet Take 25 mg by mouth daily.  01/06/15   Historical Provider, MD  NEXIUM 40 MG capsule Take 40 mg by mouth daily. 01/19/15   Historical Provider, MD  NOVOLOG 100 UNIT/ML injection Inject 15 Units into the skin 2 (two) times daily. Sliding Scale:  >300 15 units < 300 do not use 01/10/15   Historical Provider, MD  omeprazole (PRILOSEC) 20 MG capsule Take 20 mg by mouth daily.  12/18/14   Historical Provider, MD  PROAIR HFA 108 (90 BASE) MCG/ACT inhaler Inhale 2 puffs into the lungs  every 6 (six) hours as needed. 01/10/15   Historical Provider, MD  simvastatin (ZOCOR) 10 MG tablet Take 10 mg by mouth every morning.     Historical Provider, MD  ziprasidone (GEODON) 40 MG capsule Take 40 mg by mouth every morning.  02/13/15   Historical Provider, MD   Triage Vitals: BP 125/87 mmHg  Pulse 96  Temp(Src) 97.7 F (36.5 C) (Oral)  Resp 18  SpO2 100%  LMP 02/09/2016 Physical Exam  Constitutional: She is oriented to person, place, and time. She appears well-developed and well-nourished.  HENT:  Head: Normocephalic and atraumatic.  Eyes: Conjunctivae and EOM are normal. Pupils are equal, round, and reactive to light. Right eye exhibits no discharge. Left eye exhibits no discharge.  Neck: Normal range of motion. Neck supple. No JVD present.  Cardiovascular: Normal rate, regular rhythm and normal heart sounds.   No murmur heard. Pulmonary/Chest: Effort normal and breath sounds normal. She has no wheezes. She has no rales. She exhibits tenderness.  chest tender right costochondral junction  Abdominal: Soft. Bowel sounds are normal. She exhibits no distension and no mass. There is no tenderness.  Musculoskeletal: Normal range of motion. She exhibits no edema.  Lymphadenopathy:    She has no cervical adenopathy.  Neurological: She is alert and oriented to person, place, and time. No cranial nerve deficit. She exhibits normal muscle tone. Coordination normal.  Skin: Skin is warm and dry. No rash noted.  Psychiatric: She has a normal mood and affect.  Nursing note and vitals reviewed.   ED Course  Procedures  DIAGNOSTIC STUDIES:  Oxygen Saturation is 100% on RA, normal by my interpretation.    COORDINATION OF CARE:  2:11 AM Discussed treatment Phelps, which includes IV of pain medicaiton with pt at bedside and pt agreed to Phelps.  Labs Review Results for orders placed or performed during the hospital encounter of [id number]  Basic metabolic panel  Result Value Ref Range    Sodium 132 (L) 135 - 145 mmol/L   Potassium 3.6 3.5 - 5.1 mmol/L   Chloride 96 (L) 101 - 111 mmol/L   CO2 26 22 - 32 mmol/L   Glucose, Bld 372 (H) 65 - 99 mg/dL   BUN 12 6 - 20 mg/dL   Creatinine, Ser 1.11 (H) 0.44 - 1.00 mg/dL   Calcium 9.3 8.9 - 10.3 mg/dL   GFR calc non Af Amer >60 >60 mL/min   GFR calc Af Amer >60 >60 mL/min   Anion gap 10 5 - 15  CBC  Result Value Ref Range   WBC 11.8 (H) 4.0 - 10.5 K/uL   RBC 4.83 3.87 - 5.11 MIL/uL   Hemoglobin 15.0 12.0 - 15.0 g/dL   HCT 42.3 36.0 - 46.0 %  MCV 87.6 78.0 - 100.0 fL   MCH 31.1 26.0 - 34.0 pg   MCHC 35.5 30.0 - 36.0 g/dL   RDW 12.1 11.5 - 15.5 %   Platelets 215 150 - 400 K/uL  I-stat troponin, ED  Result Value Ref Range   Troponin i, poc 0.00 0.00 - 0.08 ng/mL   Comment 3            Imaging Review Dg Chest 2 View  03/11/2016  CLINICAL DATA:  Acute onset of left-sided chest pain, radiating to the left arm. Initial encounter. EXAM: CHEST  2 VIEW COMPARISON:  Chest radiograph performed 12/07/2015 FINDINGS: The lungs are well-aerated and clear. There is no evidence of focal opacification, pleural effusion or pneumothorax. The heart is normal in size; the mediastinal contour is within normal limits. No acute osseous abnormalities are seen. Clips are noted within the right upper quadrant, reflecting prior cholecystectomy. IMPRESSION: No acute cardiopulmonary process seen. Electronically Signed   By: Garald Balding M.D.   On: 03/11/2016 01:57   I have personally reviewed and evaluated these images and lab results as part of my medical decision-making.   EKG Interpretation   Date/Time:  Thursday March 11 2016 01:02:45 EDT Ventricular Rate:  100 PR Interval:  126 QRS Duration: 74 QT Interval:  338 QTC Calculation: 436 R Axis:   75 Text Interpretation:  Sinus rhythm with frequent Premature ventricular  complexes Biatrial enlargement Abnormal ECG When compared with ECG of  12/07/2015, No significant change was found Confirmed  by [facility]  MD, Bronnie Vasseur  ([id number]) on 03/11/2016 1:07:28 AM      MDM   Final diagnoses:  Chest wall pain    Chest pain with chest wall tenderness suspicious for costochondritis. Low suspicion for acute coronary syndrome. She is PERC negative making pulmonary embolism extremely unlikely. No cough or fever to suggest pneumonia. She is sent for chest x-ray which is unremarkable and screening labs including troponin are unremarkable except for mildly elevated glucose. She's given ketorolac with partial relief of pain. She is discharged with prescriptions for naproxen and oxycodone. Old records are reviewed and she has a prior ED visit for atypical chest pain.  I personally performed the services described in this documentation, which was scribed in my presence. The recorded information has been reviewed and is accurate.      Kathy Fuel, MD [id number]

## 2016-05-22 ENCOUNTER — Emergency Department (HOSPITAL_COMMUNITY)
Admission: EM | Admit: 2016-05-22 | Discharge: 2016-05-23 | Disposition: A | Discharge status: Home / Self Care | Payer: Medicaid Other | Attending: Emergency Medicine | Admitting: Emergency Medicine

## 2016-05-22 ENCOUNTER — Encounter (HOSPITAL_COMMUNITY): Payer: Self-pay

## 2016-05-22 DIAGNOSIS — F17210 Nicotine dependence, cigarettes, uncomplicated: Secondary | ICD-10-CM | POA: Diagnosis not present

## 2016-05-22 DIAGNOSIS — R102 Pelvic and perineal pain: Secondary | ICD-10-CM | POA: Diagnosis present

## 2016-05-22 DIAGNOSIS — Z7984 Long term (current) use of oral hypoglycemic drugs: Secondary | ICD-10-CM | POA: Diagnosis not present

## 2016-05-22 DIAGNOSIS — Z79899 Other long term (current) drug therapy: Secondary | ICD-10-CM | POA: Diagnosis not present

## 2016-05-22 DIAGNOSIS — I10 Essential (primary) hypertension: Secondary | ICD-10-CM | POA: Insufficient documentation

## 2016-05-22 DIAGNOSIS — N751 Abscess of Bartholin's gland: Secondary | ICD-10-CM | POA: Insufficient documentation

## 2016-05-22 DIAGNOSIS — E119 Type 2 diabetes mellitus without complications: Secondary | ICD-10-CM | POA: Diagnosis not present

## 2016-05-22 DIAGNOSIS — N390 Urinary tract infection, site not specified: Secondary | ICD-10-CM | POA: Diagnosis not present

## 2016-05-22 LAB — I-STAT CHEM 8, ED
BUN: 16 mg/dL (ref 6–20)
CALCIUM ION: 1.1 mmol/L — AB (ref 1.13–1.30)
CHLORIDE: 98 mmol/L — AB (ref 101–111)
CREATININE: 1 mg/dL (ref 0.44–1.00)
GLUCOSE: 451 mg/dL — AB (ref 65–99)
HCT: 40 % (ref 36.0–46.0)
HEMOGLOBIN: 13.6 g/dL (ref 12.0–15.0)
Potassium: 4.3 mmol/L (ref 3.5–5.1)
Sodium: 134 mmol/L — ABNORMAL LOW (ref 135–145)
TCO2: 26 mmol/L (ref 0–100)

## 2016-05-22 MED ORDER — KETOROLAC TROMETHAMINE 15 MG/ML IJ SOLN
15.0000 mg | Freq: Once | INTRAMUSCULAR | Status: AC
  Administered 2016-05-23: 15 mg via INTRAVENOUS
  Filled 2016-05-22: qty 1

## 2016-05-22 MED ORDER — CLINDAMYCIN PHOSPHATE 900 MG/50ML IV SOLN
900.0000 mg | Freq: Once | INTRAVENOUS | Status: AC
  Administered 2016-05-23: 900 mg via INTRAVENOUS
  Filled 2016-05-22: qty 50

## 2016-05-22 MED ORDER — LIDOCAINE-EPINEPHRINE 2 %-1:100000 IJ SOLN
20.0000 mL | Freq: Once | INTRAMUSCULAR | Status: AC
  Administered 2016-05-22: 20 mL via INTRADERMAL
  Filled 2016-05-22: qty 1

## 2016-05-22 MED ORDER — HYDROMORPHONE HCL 1 MG/ML IJ SOLN
1.0000 mg | Freq: Once | INTRAMUSCULAR | Status: AC
  Administered 2016-05-22: 1 mg via INTRAVENOUS
  Filled 2016-05-22: qty 1

## 2016-05-22 NOTE — ED Triage Notes (Signed)
Patient states that had a "lump" that appeared on her labia x2 days ago.  Patient states that the lump has doubled in size since yesterday and is painful.  Patient states that since the lump has appeared that she has no control over her bladder and has urinated on her self several times.  Patient denies blood in her urine or painful urination.  Patient rates pain 8/10.

## 2016-05-22 NOTE — ED Provider Notes (Signed)
[facility] DEPT Provider Note   CSN: [id number] Arrival date & time: 05/22/16  2150  First Provider Contact:  None       History   Chief Complaint Chief Complaint  Patient presents with  . Abscess    HPI Kathy Phelps is a 35 y.o. female.  HPI 35 yo F with PMHx of HTN, DM2, who presents with a 2-day history of labial pain. Pt states her sx started gradually as an aching, throbbing pain in her groin. She then began to notice a swelling in the area that worsened over the following 24 hours. The pain is now severe, 10/10, made worse with any movement or palpation. She has a h/o "boils" but denies any h/o MRSA. Blood sugars have been >200, which is usual for her. No fevers. No diarrhea, nausea, or vomiting. No other rashes.  Past Medical History:  Diagnosis Date  . Bipolar affective ([place])   . Complete miscarriage   . Depression   . Diabetes mellitus   . Hyperlipidemia   . Hypertension   . Pregnancy complication    HELP Syndrome  . Renal disorder   . Schizophrenia ([place])     There are no active problems to display for this patient.   Past Surgical History:  Procedure Laterality Date  . CESAREAN SECTION    . CHOLECYSTECTOMY    . TUBAL LIGATION      OB History    No data available       Home Medications    Prior to Admission medications   Medication Sig Start Date End Date Taking? Authorizing Provider  ADVAIR DISKUS 100-50 MCG/DOSE AEPB Inhale 1 puff into the lungs daily. 01/10/15  Yes Historical Provider, MD  ASPIRIN LOW DOSE 81 MG EC tablet Take 81 mg by mouth daily. 01/10/15  Yes Historical Provider, MD  clonazePAM (KLONOPIN) 1 MG tablet Take 1 mg by mouth 4 (four) times daily as needed for anxiety. 04/12/16  Yes Historical Provider, MD  cyclobenzaprine (FLEXERIL) 10 MG tablet Take 10 mg by mouth 3 (three) times daily as needed for muscle spasms. 04/25/16  Yes Historical Provider, MD  gabapentin (NEURONTIN) 300 MG capsule Take 300 mg by mouth 2 (two) times daily.  03/11/16  Yes Historical Provider, MD  hydrOXYzine (VISTARIL) 50 MG capsule Take 50 mg by mouth 3 (three) times daily as needed (for anxiety).  01/06/15  Yes Historical Provider, MD  LANTUS 100 UNIT/ML injection Inject 20 Units into the skin 2 (two) times daily.  01/10/15  Yes Historical Provider, MD  LATUDA 120 MG TABS Take 120 mg by mouth at bedtime. 04/12/16  Yes Historical Provider, MD  metFORMIN (GLUCOPHAGE) 1000 MG tablet Take 1 tablet (1,000 mg total) by mouth 2 (two) times daily. Patient taking differently: Take 1,500 mg by mouth 2 (two) times daily.  07/16/14  Yes Nicole Pisciotta, PA-C  metoprolol tartrate (LOPRESSOR) 25 MG tablet Take 25 mg by mouth daily.  01/06/15  Yes Historical Provider, MD  NEXIUM 40 MG capsule Take 40 mg by mouth daily. 01/19/15  Yes Historical Provider, MD  NOVOLOG 100 UNIT/ML injection Inject 0-30 Units into the skin 3 (three) times daily with meals. Sliding Scale:  >300 15 units < 300 do not use  01/10/15  Yes Historical Provider, MD  oxcarbazepine (TRILEPTAL) 600 MG tablet Take 600 mg by mouth 2 (two) times daily. 04/12/16  Yes Historical Provider, MD  PROAIR HFA 108 (90 BASE) MCG/ACT inhaler Inhale 2 puffs into the lungs every 6 (  six) hours as needed. 01/10/15  Yes Historical Provider, MD  simvastatin (ZOCOR) 40 MG tablet Take 40 mg by mouth daily. 04/25/16  Yes Historical Provider, MD  tamsulosin (FLOMAX) 0.4 MG CAPS capsule Take 0.4 mg by mouth daily. 04/12/16  Yes Historical Provider, MD  zolpidem (AMBIEN) 10 MG tablet Take 10 mg by mouth at bedtime as needed for sleep. 04/26/16  Yes Historical Provider, MD  cephALEXin (KEFLEX) 500 MG capsule Take 1 capsule (500 mg total) by mouth 3 (three) times daily. 05/23/16 06/02/16  Duffy Bruce, MD  naproxen (NAPROSYN) 500 MG tablet Take 1 tablet (500 mg total) by mouth 2 (two) times daily as needed for mild pain or moderate pain. 05/23/16   Duffy Bruce, MD  oxyCODONE (ROXICODONE) 5 MG immediate release tablet Take 1 tablet (5 mg total)  by mouth every 6 (six) hours as needed for severe pain. 05/23/16   Duffy Bruce, MD  sulfamethoxazole-trimethoprim (BACTRIM DS,SEPTRA DS) 800-160 MG tablet Take 1 tablet by mouth 2 (two) times daily. 05/23/16 06/02/16  Duffy Bruce, MD    Family History No family history on file.  Social History Social History  Substance Use Topics  . Smoking status: Current Every Day Smoker    Packs/day: 0.50    Types: Cigarettes  . Smokeless tobacco: Never Used  . Alcohol use No     Allergies   Hydrocodone-acetaminophen and Tylenol [acetaminophen]   Review of Systems Review of Systems  Constitutional: Negative for chills, fatigue and fever.  HENT: Negative for congestion and rhinorrhea.   Eyes: Negative for visual disturbance.  Respiratory: Negative for cough, shortness of breath and wheezing.   Cardiovascular: Negative for chest pain and leg swelling.  Gastrointestinal: Negative for abdominal pain, diarrhea, nausea and vomiting.  Genitourinary: Positive for pelvic pain and vaginal pain. Negative for dysuria, flank pain, vaginal bleeding and vaginal discharge.  Musculoskeletal: Negative for neck pain and neck stiffness.  Skin: Negative for rash and wound.  Allergic/Immunologic: Negative for immunocompromised state.  Neurological: Negative for syncope, weakness and headaches.  All other systems reviewed and are negative.    Physical Exam Updated Vital Signs BP (!) 152/102 (BP Location: Right Arm)   Pulse 84   Temp 98.4 F (36.9 C) (Oral)   Resp 16   Ht 5\' 9"  (1.753 m)   Wt 145 lb (65.8 kg)   LMP 04/21/2016 (Approximate)   SpO2 100%   BMI 21.41 kg/m   Physical Exam  Constitutional: She is oriented to person, place, and time. She appears well-developed and well-nourished. No distress.  HENT:  Head: Normocephalic and atraumatic.  Mouth/Throat: Oropharynx is clear and moist.  Eyes: Conjunctivae are normal. Pupils are equal, round, and reactive to light.  Neck: Neck supple.    Cardiovascular: Normal rate, regular rhythm and normal heart sounds.  Exam reveals no friction rub.   No murmur heard. Pulmonary/Chest: Effort normal and breath sounds normal. No respiratory distress. She has no wheezes. She has no rales.  Abdominal: She exhibits no distension. There is no tenderness.  Genitourinary:     Musculoskeletal: She exhibits no edema.  Neurological: She is alert and oriented to person, place, and time. She exhibits normal muscle tone.  Skin: Skin is warm. Capillary refill takes less than 2 seconds.  Nursing note and vitals reviewed.    ED Treatments / Results  Labs (all labs ordered are listed, but only abnormal results are displayed) Labs Reviewed  WET PREP, GENITAL - Abnormal; Notable for the following:  Result Value   Clue Cells Wet Prep HPF POC PRESENT (*)    WBC, Wet Prep HPF POC MODERATE (*)    All other components within normal limits  CBC WITH DIFFERENTIAL/PLATELET - Abnormal; Notable for the following:    WBC 12.8 (*)    Lymphs Abs 4.1 (*)    All other components within normal limits  URINALYSIS, ROUTINE W REFLEX MICROSCOPIC (NOT AT [facility]) - Abnormal; Notable for the following:    APPearance CLOUDY (*)    Specific Gravity, Urine 1.035 (*)    Glucose, UA >1000 (*)    Hgb urine dipstick TRACE (*)    Nitrite POSITIVE (*)    All other components within normal limits  URINE MICROSCOPIC-ADD ON - Abnormal; Notable for the following:    Squamous Epithelial / LPF 6-30 (*)    Bacteria, UA MANY (*)    All other components within normal limits  I-STAT CHEM 8, ED - Abnormal; Notable for the following:    Sodium 134 (*)    Chloride 98 (*)    Glucose, Bld 451 (*)    Calcium, Ion 1.10 (*)    All other components within normal limits  URINE CULTURE  RAPID HIV SCREEN (HIV 1/2 AB+AG)  HIV ANTIBODY (ROUTINE TESTING)  RPR  I-STAT BETA HCG BLOOD, ED ([facility], [facility], [facility] ONLY)  GC/CHLAMYDIA PROBE AMP ([place]) NOT AT [facility]    EKG  EKG  Interpretation None       Radiology Ct Pelvis W Contrast  Result Date: 05/23/2016 CLINICAL DATA:  Acute onset of left labial lump, with intermittent urinary incontinence. Initial encounter. EXAM: CT PELVIS WITH CONTRAST TECHNIQUE: Multidetector CT imaging of the pelvis was performed using the standard protocol following the bolus administration of intravenous contrast. CONTRAST:  132mL ISOVUE-300 IOPAMIDOL (ISOVUE-300) INJECTION 61% COMPARISON:  CT of the abdomen and pelvis from 12/01/2012 FINDINGS: Visualized small and large bowel loops are grossly unremarkable. The appendix is unremarkable in appearance. The bladder is moderately distended and grossly unremarkable. The uterus is unremarkable in appearance. A 3.3 cm right adnexal cystic focus is likely physiologic. The ovaries are otherwise unremarkable. No inguinal lymphadenopathy is seen. A 2.2 cm peripherally enhancing focus is noted at the left labia, corresponding to the patient's focal symptoms. This contains a 0.7 cm focus of fluid, suggestive of a tiny abscess. No acute osseous abnormalities are identified. IMPRESSION: 1. A 2.2 cm peripherally enhancing focus at the left labia, corresponds to the patient's focal symptoms, containing a 0.7 cm focus of fluid, suggestive of a tiny abscess and associated soft tissue inflammation. 2. 3.3 cm right adnexal cystic focus is likely physiologic. Electronically Signed   By: Garald Balding M.D.   On: 05/23/2016 01:35    Procedures .Marland KitchenIncision and Drainage Date/Time: 05/23/2016 1:17 AM Performed by: Duffy Bruce Authorized by: Duffy Bruce   Consent:    Consent obtained:  Verbal   Consent given by:  Patient   Risks discussed:  Bleeding, damage to other organs, incomplete drainage, pain and infection   Alternatives discussed:  Alternative treatment Location:    Type:  Abscess   Size:  3 x 2   Location:  Anogenital   Anogenital location:  Bartholin's gland Pre-procedure details:    Skin  preparation:  Betadine Anesthesia (see MAR for exact dosages):    Anesthesia method:  Local infiltration   Local anesthetic:  Lidocaine 1% WITH epi Procedure type:    Complexity:  Simple Procedure details:    Needle aspiration: no  Incision types:  Single straight   Incision depth:  Submucosal   Scalpel blade:  11   Wound management:  Probed and deloculated and irrigated with saline   Drainage:  Purulent   Drainage amount:  Copious   Wound treatment:  Wound left open   Packing materials:  None   (including critical care time)  Medications Ordered in ED Medications  lidocaine-EPINEPHrine (XYLOCAINE W/EPI) 2 %-1:100000 (with pres) injection 20 mL (20 mLs Intradermal Given 05/22/16 2300)  HYDROmorphone (DILAUDID) injection 1 mg (1 mg Intravenous Given 05/22/16 2300)  clindamycin (CLEOCIN) IVPB 900 mg (0 mg Intravenous Stopped 05/23/16 0050)  ketorolac (TORADOL) 15 MG/ML injection 15 mg (15 mg Intravenous Given 05/23/16 0020)  sodium chloride 0.9 % bolus 1,000 mL (0 mLs Intravenous Stopped 05/23/16 0130)  iopamidol (ISOVUE-300) 61 % injection 100 mL (100 mLs Intravenous Contrast Given 05/23/16 0045)  HYDROmorphone (DILAUDID) injection 1 mg (1 mg Intravenous Given 05/23/16 0123)     Initial Impression / Assessment and Plan / ED Course  I have reviewed the triage vital signs and the nursing notes.  Pertinent labs & imaging results that were available during my care of the patient were reviewed by me and considered in my medical decision making (see chart for details).  Clinical Course   35 yo F with PMHx of poorly-controlled T2DM who p/w perineal abscess. Exam is c/w Bartholin's abscess. Labs show moderate leukocytosis and hyperglycemia but no evidence of DKA. Given leukocytosis, h/o poorly-controlled DM, CT pelvis obtained and shows no evidence of deep space abscess, Fournier's, or deeper cellulitis. Abscess I&D'ed at bedside as above. Pt tolerated well with expression of copious  purulence. Pt unable to tolerate word catheter, however, so wound left open for drainage. Will advise warm soaks, Sitz baths, and OB f/u. Otherwise, UA with +UTI. Given Bartholin's abscess, and UTI, will give bactrim/keflex for combined strep/staph and UTI coverage. Return precautions given.   Final Clinical Impressions(s) / ED Diagnoses   Final diagnoses:  Bartholin's gland abscess  UTI (lower urinary tract infection)    New Prescriptions Discharge Medication List as of 05/23/2016  1:52 AM    START taking these medications   Details  sulfamethoxazole-trimethoprim (BACTRIM DS,SEPTRA DS) 800-160 MG tablet Take 1 tablet by mouth 2 (two) times daily., Starting Sun 05/23/2016, Until Wed 06/02/2016, Print         Duffy Bruce, MD 05/23/16 1153

## 2016-05-23 ENCOUNTER — Emergency Department (HOSPITAL_COMMUNITY): Payer: Medicaid Other

## 2016-05-23 ENCOUNTER — Encounter (HOSPITAL_COMMUNITY): Payer: Self-pay | Admitting: Radiology

## 2016-05-23 LAB — CBC WITH DIFFERENTIAL/PLATELET
BASOS PCT: 0 %
Basophils Absolute: 0 10*3/uL (ref 0.0–0.1)
EOS ABS: 0.5 10*3/uL (ref 0.0–0.7)
Eosinophils Relative: 4 %
HEMATOCRIT: 37.9 % (ref 36.0–46.0)
HEMOGLOBIN: 13.5 g/dL (ref 12.0–15.0)
LYMPHS ABS: 4.1 10*3/uL — AB (ref 0.7–4.0)
Lymphocytes Relative: 32 %
MCH: 31.5 pg (ref 26.0–34.0)
MCHC: 35.6 g/dL (ref 30.0–36.0)
MCV: 88.6 fL (ref 78.0–100.0)
Monocytes Absolute: 0.8 10*3/uL (ref 0.1–1.0)
Monocytes Relative: 6 %
NEUTROS ABS: 7.5 10*3/uL (ref 1.7–7.7)
NEUTROS PCT: 58 %
Platelets: 180 10*3/uL (ref 150–400)
RBC: 4.28 MIL/uL (ref 3.87–5.11)
RDW: 12.6 % (ref 11.5–15.5)
WBC: 12.8 10*3/uL — AB (ref 4.0–10.5)

## 2016-05-23 LAB — WET PREP, GENITAL
SPERM: NONE SEEN
TRICH WET PREP: NONE SEEN
YEAST WET PREP: NONE SEEN

## 2016-05-23 LAB — URINE MICROSCOPIC-ADD ON

## 2016-05-23 LAB — RAPID HIV SCREEN (HIV 1/2 AB+AG)
HIV 1/2 Antibodies: NONREACTIVE
HIV-1 P24 Antigen - HIV24: NONREACTIVE

## 2016-05-23 LAB — URINALYSIS, ROUTINE W REFLEX MICROSCOPIC
Bilirubin Urine: NEGATIVE
Glucose, UA: >1000 mg/dL — AB
Ketones, ur: NEGATIVE mg/dL
LEUKOCYTES UA: NEGATIVE
NITRITE: POSITIVE — AB
PH: 5 (ref 5.0–8.0)
Protein, ur: NEGATIVE mg/dL
SPECIFIC GRAVITY, URINE: 1.035 — AB (ref 1.005–1.030)

## 2016-05-23 LAB — I-STAT BETA HCG BLOOD, ED (MC, WL, AP ONLY): I-stat hCG, quantitative: <5 m[IU]/mL (ref <5)

## 2016-05-23 LAB — HIV ANTIBODY (ROUTINE TESTING W REFLEX): HIV Screen 4th Generation wRfx: NONREACTIVE

## 2016-05-23 LAB — RPR: RPR Ser Ql: NONREACTIVE

## 2016-05-23 MED ORDER — NAPROXEN 500 MG PO TABS: 500.0000 mg | ORAL_TABLET | Freq: Two times a day (BID) | ORAL | 0 refills | Status: DC | PRN

## 2016-05-23 MED ORDER — CEPHALEXIN 500 MG PO CAPS: 500.0000 mg | ORAL_CAPSULE | Freq: Three times a day (TID) | ORAL | 0 refills | Status: AC

## 2016-05-23 MED ORDER — IOPAMIDOL (ISOVUE-300) INJECTION 61%
100.0000 mL | Freq: Once | INTRAVENOUS | Status: AC | PRN
  Administered 2016-05-23: 100 mL via INTRAVENOUS

## 2016-05-23 MED ORDER — OXYCODONE HCL 5 MG PO TABS: 5.0000 mg | ORAL_TABLET | Freq: Four times a day (QID) | ORAL | 0 refills | Status: DC | PRN

## 2016-05-23 MED ORDER — SULFAMETHOXAZOLE-TRIMETHOPRIM 800-160 MG PO TABS: 1.0000 | ORAL_TABLET | Freq: Two times a day (BID) | ORAL | 0 refills | Status: AC

## 2016-05-23 MED ORDER — SODIUM CHLORIDE 0.9 % IV BOLUS (SEPSIS)
1000.0000 mL | Freq: Once | INTRAVENOUS | Status: AC
  Administered 2016-05-23: 1000 mL via INTRAVENOUS

## 2016-05-23 MED ORDER — HYDROMORPHONE HCL 1 MG/ML IJ SOLN
1.0000 mg | Freq: Once | INTRAMUSCULAR | Status: AC
  Administered 2016-05-23: 1 mg via INTRAVENOUS
  Filled 2016-05-23: qty 1

## 2016-05-23 MED ORDER — CLINDAMYCIN HCL 300 MG PO CAPS: 300.0000 mg | ORAL_CAPSULE | Freq: Three times a day (TID) | ORAL | 0 refills | Status: DC

## 2016-05-23 NOTE — Discharge Instructions (Signed)
Apply warm compresses to your wound three to four times daily for the next week

## 2016-05-24 LAB — GC/CHLAMYDIA PROBE AMP (~~LOC~~) NOT AT ARMC
Chlamydia: POSITIVE — AB
NEISSERIA GONORRHEA: POSITIVE — AB

## 2016-05-25 LAB — URINE CULTURE: SPECIAL REQUESTS: NORMAL

## 2016-05-26 ENCOUNTER — Telehealth: Payer: Self-pay | Admitting: *Deleted

## 2016-05-26 NOTE — Telephone Encounter (Signed)
Post ED Visit - Positive Culture Follow-up  Culture report reviewed by antimicrobial stewardship pharmacist:  []  Elenor Quinones, Pharm.D. []  Heide Guile, Pharm.D., BCPS []  Parks Neptune, Pharm.D. []  Alycia Rossetti, Pharm.D., BCPS []  [place], Pharm.D., BCPS, AAHIVP []  Legrand Como, Pharm.D., BCPS, AAHIVP []  Milus Glazier, Pharm.D. []  Stephens November, Pharm.D. Andria Meuse, RPh  Positive urine culture Treated with Cephalexin, Clindamycin, Sulfamethoxazole-Trimethoprim, organism sensitive to the same and no further patient follow-up is required at this time.  Harlon Flor [facility] 05/26/2016, 9:29 AM

## 2016-05-26 NOTE — Telephone Encounter (Signed)
Post ED Visit - Positive Culture Follow-up: Unsuccessful Patient Follow-up  Culture assessed and recommendations reviewed by: []  Elenor Quinones, Pharm.D. []  Heide Guile, Pharm.D., BCPS []  Parks Neptune, Pharm.D. []  Alycia Rossetti, Pharm.D., BCPS []  [place], Pharm.D., BCPS, AAHIVP []  Legrand Como, Pharm.D., BCPS, AAHIVP []  Milus Glazier, Pharm.D. []  Stephens November, Pharm.D.  Positive gonorrhea/chlamydia culture  [x]  Patient discharged without antimicrobial prescription and treatment is now indicated []  Organism is resistant to prescribed ED discharge antimicrobial []  Patient with positive blood cultures   Unable to contact patient after 3 attempts, letter will be sent to address on file  Kathy Phelps 05/26/2016, 8:44 AM

## 2016-05-29 ENCOUNTER — Telehealth (HOSPITAL_COMMUNITY): Payer: Self-pay

## 2016-05-29 NOTE — Telephone Encounter (Signed)
Pt called.  Pt ID verified.  Pt informed of dx ((+) gonorrhea and chlamydia), need for addl tx, notify partner for testing and treatment and abstain from sex x 10 days,  Rx for Suprax 400 mg po x 1 & Azithromycin 1,000 mg po x 1 called to [facility] [phone] and left on their voicemail.

## 2017-02-01 ENCOUNTER — Emergency Department (HOSPITAL_COMMUNITY): Payer: Medicaid Other

## 2017-02-01 ENCOUNTER — Encounter (HOSPITAL_COMMUNITY): Payer: Self-pay | Admitting: Emergency Medicine

## 2017-02-01 ENCOUNTER — Inpatient Hospital Stay (HOSPITAL_COMMUNITY)
Admission: EM | Admit: 2017-02-01 | Discharge: 2017-02-11 | DRG: 270 | Disposition: A | Discharge status: Home Health | Payer: Medicaid Other | Attending: Internal Medicine | Admitting: Internal Medicine

## 2017-02-01 DIAGNOSIS — D62 Acute posthemorrhagic anemia: Secondary | ICD-10-CM | POA: Diagnosis not present

## 2017-02-01 DIAGNOSIS — L03116 Cellulitis of left lower limb: Secondary | ICD-10-CM | POA: Diagnosis present

## 2017-02-01 DIAGNOSIS — R296 Repeated falls: Secondary | ICD-10-CM

## 2017-02-01 DIAGNOSIS — E785 Hyperlipidemia, unspecified: Secondary | ICD-10-CM | POA: Diagnosis present

## 2017-02-01 DIAGNOSIS — R609 Edema, unspecified: Secondary | ICD-10-CM

## 2017-02-01 DIAGNOSIS — I10 Essential (primary) hypertension: Secondary | ICD-10-CM | POA: Diagnosis present

## 2017-02-01 DIAGNOSIS — L03119 Cellulitis of unspecified part of limb: Secondary | ICD-10-CM

## 2017-02-01 DIAGNOSIS — T368X5A Adverse effect of other systemic antibiotics, initial encounter: Secondary | ICD-10-CM | POA: Diagnosis not present

## 2017-02-01 DIAGNOSIS — Z720 Tobacco use: Secondary | ICD-10-CM | POA: Diagnosis present

## 2017-02-01 DIAGNOSIS — N179 Acute kidney failure, unspecified: Secondary | ICD-10-CM

## 2017-02-01 DIAGNOSIS — R338 Other retention of urine: Secondary | ICD-10-CM | POA: Diagnosis not present

## 2017-02-01 DIAGNOSIS — M79639 Pain in unspecified forearm: Secondary | ICD-10-CM

## 2017-02-01 DIAGNOSIS — E440 Moderate protein-calorie malnutrition: Secondary | ICD-10-CM | POA: Diagnosis present

## 2017-02-01 DIAGNOSIS — Z79899 Other long term (current) drug therapy: Secondary | ICD-10-CM

## 2017-02-01 DIAGNOSIS — F319 Bipolar disorder, unspecified: Secondary | ICD-10-CM | POA: Diagnosis present

## 2017-02-01 DIAGNOSIS — N170 Acute kidney failure with tubular necrosis: Secondary | ICD-10-CM | POA: Diagnosis present

## 2017-02-01 DIAGNOSIS — E1165 Type 2 diabetes mellitus with hyperglycemia: Secondary | ICD-10-CM | POA: Diagnosis not present

## 2017-02-01 DIAGNOSIS — N141 Nephropathy induced by other drugs, medicaments and biological substances: Secondary | ICD-10-CM | POA: Diagnosis not present

## 2017-02-01 DIAGNOSIS — IMO0002 Reserved for concepts with insufficient information to code with codable children: Secondary | ICD-10-CM | POA: Diagnosis present

## 2017-02-01 DIAGNOSIS — E1129 Type 2 diabetes mellitus with other diabetic kidney complication: Secondary | ICD-10-CM | POA: Diagnosis present

## 2017-02-01 DIAGNOSIS — E11628 Type 2 diabetes mellitus with other skin complications: Secondary | ICD-10-CM | POA: Diagnosis present

## 2017-02-01 DIAGNOSIS — E1152 Type 2 diabetes mellitus with diabetic peripheral angiopathy with gangrene: Principal | ICD-10-CM | POA: Diagnosis present

## 2017-02-01 DIAGNOSIS — F17210 Nicotine dependence, cigarettes, uncomplicated: Secondary | ICD-10-CM | POA: Diagnosis present

## 2017-02-01 DIAGNOSIS — E871 Hypo-osmolality and hyponatremia: Secondary | ICD-10-CM | POA: Diagnosis not present

## 2017-02-01 DIAGNOSIS — M79602 Pain in left arm: Secondary | ICD-10-CM

## 2017-02-01 DIAGNOSIS — F209 Schizophrenia, unspecified: Secondary | ICD-10-CM | POA: Diagnosis present

## 2017-02-01 DIAGNOSIS — I959 Hypotension, unspecified: Secondary | ICD-10-CM | POA: Diagnosis not present

## 2017-02-01 DIAGNOSIS — N319 Neuromuscular dysfunction of bladder, unspecified: Secondary | ICD-10-CM | POA: Diagnosis present

## 2017-02-01 DIAGNOSIS — Z794 Long term (current) use of insulin: Secondary | ICD-10-CM

## 2017-02-01 DIAGNOSIS — Z7982 Long term (current) use of aspirin: Secondary | ICD-10-CM

## 2017-02-01 DIAGNOSIS — N1330 Unspecified hydronephrosis: Secondary | ICD-10-CM | POA: Diagnosis present

## 2017-02-01 DIAGNOSIS — R221 Localized swelling, mass and lump, neck: Secondary | ICD-10-CM | POA: Diagnosis present

## 2017-02-01 DIAGNOSIS — Z681 Body mass index (BMI) 19 or less, adult: Secondary | ICD-10-CM

## 2017-02-01 DIAGNOSIS — E11649 Type 2 diabetes mellitus with hypoglycemia without coma: Secondary | ICD-10-CM | POA: Diagnosis not present

## 2017-02-01 DIAGNOSIS — E1140 Type 2 diabetes mellitus with diabetic neuropathy, unspecified: Secondary | ICD-10-CM | POA: Diagnosis present

## 2017-02-01 DIAGNOSIS — F419 Anxiety disorder, unspecified: Secondary | ICD-10-CM | POA: Diagnosis present

## 2017-02-01 DIAGNOSIS — L0390 Cellulitis, unspecified: Secondary | ICD-10-CM

## 2017-02-01 DIAGNOSIS — T508X5A Adverse effect of diagnostic agents, initial encounter: Secondary | ICD-10-CM | POA: Diagnosis not present

## 2017-02-01 DIAGNOSIS — K118 Other diseases of salivary glands: Secondary | ICD-10-CM

## 2017-02-01 LAB — CBG MONITORING, ED: Glucose-Capillary: 338 mg/dL — ABNORMAL HIGH (ref 65–99)

## 2017-02-01 MED ORDER — MORPHINE SULFATE (PF) 4 MG/ML IV SOLN
4.0000 mg | Freq: Once | INTRAVENOUS | Status: AC
  Administered 2017-02-01: 4 mg via INTRAVENOUS
  Filled 2017-02-01: qty 1

## 2017-02-01 MED ORDER — VANCOMYCIN HCL IN DEXTROSE 1-5 GM/200ML-% IV SOLN
1000.0000 mg | Freq: Once | INTRAVENOUS | Status: AC
  Administered 2017-02-02: 1000 mg via INTRAVENOUS
  Filled 2017-02-01: qty 200

## 2017-02-01 MED ORDER — PIPERACILLIN-TAZOBACTAM 3.375 G IVPB 30 MIN
3.3750 g | Freq: Once | INTRAVENOUS | Status: AC
  Administered 2017-02-01: 3.375 g via INTRAVENOUS
  Filled 2017-02-01: qty 50

## 2017-02-01 NOTE — ED Triage Notes (Signed)
Patient c/o left pinky toe pain x3 weeks and gangrenous x2 days. Reports pain increases with movement. Hx diabetes.

## 2017-02-01 NOTE — ED Notes (Signed)
Pt reports her blood glucose typically runs in the 700 hundreds.  States she takes sliding scale insulin which normally only brings it into the 400's.  Sees doctor regularly for it and they have been trying to manage it for years.

## 2017-02-01 NOTE — ED Provider Notes (Signed)
[facility] DEPT Provider Note   CSN: [id number] Arrival date & time: 02/01/17  2102  By signing my name below, I, Mayer Masker, attest that this documentation has been prepared under the direction and in the presence of [employer], PA-C. Electronically Signed: Mayer Masker, Scribe. 02/01/17. 11:39 PM.  History   Chief Complaint Chief Complaint  Patient presents with  . Toe Pain   The history is provided by the patient. No language interpreter was used.     HPI Comments: Kathy Phelps is a 36 y.o. female with a history of DM who presents to the Emergency Department complaining of constant, pain to the left foot that began 3 weeks ago. She states her daughter looked at her foot and noticed "a black hole" on the bottom of the left small toe with "gooey" discharge and blood draining from it. She states she went to the doctor and was prescribed a high dose of antibiotics for a week with no improvement. She states when she woke up this morning, the pain was worse and had radiated to her hip. She also notes the black discoloration on the small toe has spread to the 3rd and 4th toes. She has associated numbness/tingling in her left foot.  She is a current smoker.  Past Medical History:  Diagnosis Date  . Bipolar affective ([place])   . Complete miscarriage   . Depression   . Diabetes mellitus   . Hyperlipidemia   . Hypertension   . Pregnancy complication    HELP Syndrome  . Renal disorder   . Schizophrenia ([place])     There are no active problems to display for this patient.   Past Surgical History:  Procedure Laterality Date  . CESAREAN SECTION    . CHOLECYSTECTOMY    . TUBAL LIGATION      OB History    No data available       Home Medications    Prior to Admission medications   Medication Sig Start Date End Date Taking? Authorizing Provider  ADVAIR DISKUS 100-50 MCG/DOSE AEPB Inhale 1 puff into the lungs daily. 01/10/15  Yes Historical Provider, MD  alprazolam Duanne Moron)  2 MG tablet Take 2 mg by mouth 2 (two) times daily.   Yes Historical Provider, MD  ASPIRIN LOW DOSE 81 MG EC tablet Take 81 mg by mouth daily. 01/10/15  Yes Historical Provider, MD  gabapentin (NEURONTIN) 800 MG tablet Take 800 mg by mouth 4 (four) times daily.   Yes Historical Provider, MD  LANTUS 100 UNIT/ML injection Inject 60 Units into the skin 2 (two) times daily.  01/10/15  Yes Historical Provider, MD  LATUDA 120 MG TABS Take 120 mg by mouth at bedtime. 04/12/16  Yes Historical Provider, MD  metFORMIN (GLUCOPHAGE) 1000 MG tablet Take 1 tablet (1,000 mg total) by mouth 2 (two) times daily. Patient taking differently: Take 1,500 mg by mouth 2 (two) times daily.  07/16/14  Yes Nicole Pisciotta, PA-C  metoprolol tartrate (LOPRESSOR) 25 MG tablet Take 25 mg by mouth 2 (two) times daily.  01/06/15  Yes Historical Provider, MD  NOVOLOG 100 UNIT/ML injection Inject 0-30 Units into the skin 3 (three) times daily with meals. Sliding Scale:  >300 15 units < 300 do not use  01/10/15  Yes Historical Provider, MD  oxcarbazepine (TRILEPTAL) 600 MG tablet Take 600 mg by mouth 2 (two) times daily. 04/12/16  Yes Historical Provider, MD  PROAIR HFA 108 (90 BASE) MCG/ACT inhaler Inhale 2 puffs into the lungs  every 6 (six) hours as needed for shortness of breath.  01/10/15  Yes Historical Provider, MD  simvastatin (ZOCOR) 40 MG tablet Take 40 mg by mouth daily. 04/25/16  Yes Historical Provider, MD  tamsulosin (FLOMAX) 0.4 MG CAPS capsule Take 0.4 mg by mouth daily. 04/12/16  Yes Historical Provider, MD  zolpidem (AMBIEN) 10 MG tablet Take 10 mg by mouth at bedtime as needed for sleep. 04/26/16  Yes Historical Provider, MD  naproxen (NAPROSYN) 500 MG tablet Take 1 tablet (500 mg total) by mouth 2 (two) times daily as needed for mild pain or moderate pain. Patient not taking: Reported on 02/01/2017 05/23/16   Duffy Bruce, MD  oxyCODONE (ROXICODONE) 5 MG immediate release tablet Take 1 tablet (5 mg total) by mouth every 6 (six)  hours as needed for severe pain. Patient not taking: Reported on 02/01/2017 05/23/16   Duffy Bruce, MD    Family History History reviewed. No pertinent family history.  Social History Social History  Substance Use Topics  . Smoking status: Current Every Day Smoker    Packs/day: 0.50    Types: Cigarettes  . Smokeless tobacco: Never Used  . Alcohol use No     Allergies   Hydrocodone-acetaminophen and Tylenol [acetaminophen]   Review of Systems Review of Systems   All systems reviewed and are negative for acute change except as noted in the HPI.  Physical Exam Updated Vital Signs BP (!) 164/111 (BP Location: Right Arm)   Pulse (!) 102   Temp 98 F (36.7 C) (Oral)   Resp 12   Ht 5\' 9"  (1.753 m)   LMP 01/06/2017 (Approximate)   SpO2 100%   Physical Exam  Constitutional: She is oriented to person, place, and time. She appears well-developed and well-nourished. No distress.  HENT:  Head: Normocephalic and atraumatic.  Eyes: Conjunctivae are normal.  Cardiovascular: Normal rate.   Pulmonary/Chest: Effort normal.  Abdominal: She exhibits no distension.  Feet:  Left Foot:  Skin Integrity: Positive for erythema.  Neurological: She is alert and oriented to person, place, and time.  Skin: Skin is warm and dry.  Black, necrotic wound around her left small toe and there is bleeding from the wound. There is purplish discoloration to the 4th and 5th left toes. There is Pus/blood discharge Redness that extends up the foot and lateral ankle.  Psychiatric: She has a normal mood and affect.  Nursing note and vitals reviewed.    ED Treatments / Results  DIAGNOSTIC STUDIES: Oxygen Saturation is 100% on RA, normal by my interpretation.    COORDINATION OF CARE: 11:12 PM Discussed treatment plan with pt at bedside and pt agreed to plan.   Labs (all labs ordered are listed, but only abnormal results are displayed) Labs Reviewed  CBG MONITORING, ED - Abnormal; Notable for  the following:       Result Value   Glucose-Capillary 338 (*)    All other components within normal limits    EKG  EKG Interpretation None       Radiology Dg Foot Complete Left  Result Date: 02/01/2017 CLINICAL DATA:  LEFT fifth toe pain for 3 weeks, gangrenous for 2 days. EXAM: LEFT FOOT - COMPLETE 3+ VIEW COMPARISON:  LEFT foot radiographs January 10, 2017 FINDINGS: No acute fracture deformity or dislocation. No destructive bony lesions. Soft tissue planes are not suspicious. IMPRESSION: Negative. Electronically Signed   By: Elon Alas M.D.   On: 02/01/2017 22:55    Procedures Procedures (including critical care time)  Medications Ordered in ED Medications - No data to display   Initial Impression / Assessment and Plan / ED Course  I have reviewed the triage vital signs and the nursing notes.  Pertinent labs & imaging results that were available during my care of the patient were reviewed by me and considered in my medical decision making (see chart for details).     She will need admission to the hospital for possible gangrenous toe along with the fact she has cellulitis spreading from the toes to the foot.  I spoke with the tried hospitalist, who will admit the patient for further evaluation.  Patient started on IV antibiotics  Final Clinical Impressions(s) / ED Diagnoses   Final diagnoses:  None    New Prescriptions New Prescriptions   No medications on file  I personally performed the services described in this documentation, which was scribed in my presence. The recorded information has been reviewed and is accurate.    Dalia Heading, PA-C 02/02/17 [place], DO 02/02/17 1506

## 2017-02-02 ENCOUNTER — Encounter (HOSPITAL_COMMUNITY): Payer: Self-pay

## 2017-02-02 ENCOUNTER — Inpatient Hospital Stay (HOSPITAL_COMMUNITY): Payer: Medicaid Other

## 2017-02-02 DIAGNOSIS — E1140 Type 2 diabetes mellitus with diabetic neuropathy, unspecified: Secondary | ICD-10-CM | POA: Diagnosis present

## 2017-02-02 DIAGNOSIS — L03116 Cellulitis of left lower limb: Secondary | ICD-10-CM | POA: Diagnosis present

## 2017-02-02 DIAGNOSIS — F319 Bipolar disorder, unspecified: Secondary | ICD-10-CM | POA: Diagnosis present

## 2017-02-02 DIAGNOSIS — F17210 Nicotine dependence, cigarettes, uncomplicated: Secondary | ICD-10-CM | POA: Diagnosis present

## 2017-02-02 DIAGNOSIS — F209 Schizophrenia, unspecified: Secondary | ICD-10-CM | POA: Diagnosis present

## 2017-02-02 DIAGNOSIS — M79609 Pain in unspecified limb: Secondary | ICD-10-CM

## 2017-02-02 DIAGNOSIS — Z79899 Other long term (current) drug therapy: Secondary | ICD-10-CM | POA: Diagnosis not present

## 2017-02-02 DIAGNOSIS — Z7982 Long term (current) use of aspirin: Secondary | ICD-10-CM | POA: Diagnosis not present

## 2017-02-02 DIAGNOSIS — Z681 Body mass index (BMI) 19 or less, adult: Secondary | ICD-10-CM | POA: Diagnosis not present

## 2017-02-02 DIAGNOSIS — I10 Essential (primary) hypertension: Secondary | ICD-10-CM

## 2017-02-02 DIAGNOSIS — F419 Anxiety disorder, unspecified: Secondary | ICD-10-CM | POA: Diagnosis present

## 2017-02-02 DIAGNOSIS — N179 Acute kidney failure, unspecified: Secondary | ICD-10-CM | POA: Diagnosis not present

## 2017-02-02 DIAGNOSIS — I5030 Unspecified diastolic (congestive) heart failure: Secondary | ICD-10-CM | POA: Diagnosis not present

## 2017-02-02 DIAGNOSIS — E440 Moderate protein-calorie malnutrition: Secondary | ICD-10-CM | POA: Diagnosis present

## 2017-02-02 DIAGNOSIS — E871 Hypo-osmolality and hyponatremia: Secondary | ICD-10-CM | POA: Diagnosis not present

## 2017-02-02 DIAGNOSIS — N170 Acute kidney failure with tubular necrosis: Secondary | ICD-10-CM | POA: Diagnosis present

## 2017-02-02 DIAGNOSIS — E1165 Type 2 diabetes mellitus with hyperglycemia: Secondary | ICD-10-CM

## 2017-02-02 DIAGNOSIS — T508X5A Adverse effect of diagnostic agents, initial encounter: Secondary | ICD-10-CM | POA: Diagnosis not present

## 2017-02-02 DIAGNOSIS — Z720 Tobacco use: Secondary | ICD-10-CM

## 2017-02-02 DIAGNOSIS — D62 Acute posthemorrhagic anemia: Secondary | ICD-10-CM | POA: Diagnosis not present

## 2017-02-02 DIAGNOSIS — R338 Other retention of urine: Secondary | ICD-10-CM | POA: Diagnosis not present

## 2017-02-02 DIAGNOSIS — N1330 Unspecified hydronephrosis: Secondary | ICD-10-CM | POA: Diagnosis present

## 2017-02-02 DIAGNOSIS — E785 Hyperlipidemia, unspecified: Secondary | ICD-10-CM | POA: Diagnosis present

## 2017-02-02 DIAGNOSIS — E11649 Type 2 diabetes mellitus with hypoglycemia without coma: Secondary | ICD-10-CM | POA: Diagnosis not present

## 2017-02-02 DIAGNOSIS — M79672 Pain in left foot: Secondary | ICD-10-CM | POA: Diagnosis present

## 2017-02-02 DIAGNOSIS — E1152 Type 2 diabetes mellitus with diabetic peripheral angiopathy with gangrene: Secondary | ICD-10-CM | POA: Diagnosis present

## 2017-02-02 DIAGNOSIS — R601 Generalized edema: Secondary | ICD-10-CM | POA: Diagnosis not present

## 2017-02-02 DIAGNOSIS — N319 Neuromuscular dysfunction of bladder, unspecified: Secondary | ICD-10-CM | POA: Diagnosis present

## 2017-02-02 DIAGNOSIS — I96 Gangrene, not elsewhere classified: Secondary | ICD-10-CM | POA: Diagnosis not present

## 2017-02-02 DIAGNOSIS — N141 Nephropathy induced by other drugs, medicaments and biological substances: Secondary | ICD-10-CM | POA: Diagnosis not present

## 2017-02-02 DIAGNOSIS — L03032 Cellulitis of left toe: Secondary | ICD-10-CM | POA: Diagnosis not present

## 2017-02-02 DIAGNOSIS — E1129 Type 2 diabetes mellitus with other diabetic kidney complication: Secondary | ICD-10-CM | POA: Diagnosis present

## 2017-02-02 DIAGNOSIS — IMO0002 Reserved for concepts with insufficient information to code with codable children: Secondary | ICD-10-CM | POA: Diagnosis present

## 2017-02-02 DIAGNOSIS — L03031 Cellulitis of right toe: Secondary | ICD-10-CM | POA: Diagnosis not present

## 2017-02-02 DIAGNOSIS — I70262 Atherosclerosis of native arteries of extremities with gangrene, left leg: Secondary | ICD-10-CM | POA: Diagnosis not present

## 2017-02-02 DIAGNOSIS — R221 Localized swelling, mass and lump, neck: Secondary | ICD-10-CM | POA: Diagnosis present

## 2017-02-02 LAB — BASIC METABOLIC PANEL
Anion gap: 6 (ref 5–15)
BUN: 10 mg/dL (ref 6–20)
CO2: 28 mmol/L (ref 22–32)
Calcium: 8.4 mg/dL — ABNORMAL LOW (ref 8.9–10.3)
Chloride: 100 mmol/L — ABNORMAL LOW (ref 101–111)
Creatinine, Ser: 0.92 mg/dL (ref 0.44–1.00)
GFR calc Af Amer: >60 mL/min (ref >60)
GFR calc non Af Amer: >60 mL/min (ref >60)
Glucose, Bld: 319 mg/dL — ABNORMAL HIGH (ref 65–99)
Potassium: 3.5 mmol/L (ref 3.5–5.1)
Sodium: 134 mmol/L — ABNORMAL LOW (ref 135–145)

## 2017-02-02 LAB — COMPREHENSIVE METABOLIC PANEL
ALT: 10 U/L — ABNORMAL LOW (ref 14–54)
ANION GAP: 8 (ref 5–15)
AST: 40 U/L (ref 15–41)
Albumin: 2.6 g/dL — ABNORMAL LOW (ref 3.5–5.0)
Alkaline Phosphatase: 63 U/L (ref 38–126)
BUN: 11 mg/dL (ref 6–20)
CHLORIDE: 99 mmol/L — AB (ref 101–111)
CO2: 28 mmol/L (ref 22–32)
Calcium: 8.2 mg/dL — ABNORMAL LOW (ref 8.9–10.3)
Creatinine, Ser: 0.87 mg/dL (ref 0.44–1.00)
GFR calc Af Amer: >60 mL/min (ref >60)
GFR calc non Af Amer: >60 mL/min (ref >60)
Glucose, Bld: 302 mg/dL — ABNORMAL HIGH (ref 65–99)
Potassium: 3.6 mmol/L (ref 3.5–5.1)
SODIUM: 135 mmol/L (ref 135–145)
Total Bilirubin: 0.3 mg/dL (ref 0.3–1.2)
Total Protein: 6.1 g/dL — ABNORMAL LOW (ref 6.5–8.1)

## 2017-02-02 LAB — CBC WITH DIFFERENTIAL/PLATELET
Basophils Absolute: 0 10*3/uL (ref 0.0–0.1)
Basophils Absolute: 0 10*3/uL (ref 0.0–0.1)
Basophils Relative: 0 %
Basophils Relative: 0 %
Eosinophils Absolute: 0.3 10*3/uL (ref 0.0–0.7)
Eosinophils Absolute: 0.3 10*3/uL (ref 0.0–0.7)
Eosinophils Relative: 3 %
Eosinophils Relative: 3 %
HCT: 35.9 % — ABNORMAL LOW (ref 36.0–46.0)
HEMATOCRIT: 34.7 % — AB (ref 36.0–46.0)
HEMOGLOBIN: 12 g/dL (ref 12.0–15.0)
Hemoglobin: 12.5 g/dL (ref 12.0–15.0)
LYMPHS ABS: 4 10*3/uL (ref 0.7–4.0)
Lymphocytes Relative: 38 %
Lymphocytes Relative: 42 %
Lymphs Abs: 4 10*3/uL (ref 0.7–4.0)
MCH: 29.8 pg (ref 26.0–34.0)
MCH: 29.9 pg (ref 26.0–34.0)
MCHC: 34.6 g/dL (ref 30.0–36.0)
MCHC: 34.8 g/dL (ref 30.0–36.0)
MCV: 85.9 fL (ref 78.0–100.0)
MCV: 86.1 fL (ref 78.0–100.0)
MONOS PCT: 6 %
Monocytes Absolute: 0.6 10*3/uL (ref 0.1–1.0)
Monocytes Absolute: 0.6 10*3/uL (ref 0.1–1.0)
Monocytes Relative: 6 %
NEUTROS ABS: 4.5 10*3/uL (ref 1.7–7.7)
NEUTROS PCT: 49 %
Neutro Abs: 5.7 10*3/uL (ref 1.7–7.7)
Neutrophils Relative %: 53 %
Platelets: 209 10*3/uL (ref 150–400)
Platelets: 236 10*3/uL (ref 150–400)
RBC: 4.03 MIL/uL (ref 3.87–5.11)
RBC: 4.18 MIL/uL (ref 3.87–5.11)
RDW: 12.4 % (ref 11.5–15.5)
RDW: 12.5 % (ref 11.5–15.5)
WBC: 10.7 10*3/uL — ABNORMAL HIGH (ref 4.0–10.5)
WBC: 9.5 10*3/uL (ref 4.0–10.5)

## 2017-02-02 LAB — LACTIC ACID, PLASMA
LACTIC ACID, VENOUS: 1.5 mmol/L (ref 0.5–1.9)
LACTIC ACID, VENOUS: 1.2 mmol/L (ref 0.5–1.9)

## 2017-02-02 LAB — GLUCOSE, CAPILLARY
Glucose-Capillary: 329 mg/dL — ABNORMAL HIGH (ref 65–99)
Glucose-Capillary: 304 mg/dL — ABNORMAL HIGH (ref 65–99)
Glucose-Capillary: 317 mg/dL — ABNORMAL HIGH (ref 65–99)
Glucose-Capillary: 190 mg/dL — ABNORMAL HIGH (ref 65–99)

## 2017-02-02 LAB — SEDIMENTATION RATE: SED RATE: 34 mm/h — AB (ref 0–22)

## 2017-02-02 LAB — PREGNANCY, URINE: Preg Test, Ur: NEGATIVE

## 2017-02-02 LAB — HCG, SERUM, QUALITATIVE: Preg, Serum: NEGATIVE

## 2017-02-02 MED ORDER — HYDRALAZINE HCL 20 MG/ML IJ SOLN
10.0000 mg | INTRAMUSCULAR | Status: DC | PRN
  Administered 2017-02-03: 10 mg via INTRAVENOUS

## 2017-02-02 MED ORDER — ENSURE ENLIVE PO LIQD
237.0000 mL | Freq: Two times a day (BID) | ORAL | Status: DC
  Administered 2017-02-02 – 2017-02-06 (×2): 237 mL via ORAL

## 2017-02-02 MED ORDER — ALBUTEROL SULFATE HFA 108 (90 BASE) MCG/ACT IN AERS: 2.0000 | INHALATION_SPRAY | Freq: Four times a day (QID) | RESPIRATORY_TRACT | Status: DC | PRN

## 2017-02-02 MED ORDER — VANCOMYCIN HCL IN DEXTROSE 1-5 GM/200ML-% IV SOLN
1000.0000 mg | Freq: Two times a day (BID) | INTRAVENOUS | Status: DC
  Administered 2017-02-02 (×2): 1000 mg via INTRAVENOUS
  Filled 2017-02-02 (×5): qty 200

## 2017-02-02 MED ORDER — SIMVASTATIN 40 MG PO TABS
40.0000 mg | ORAL_TABLET | Freq: Every day | ORAL | Status: DC
  Administered 2017-02-02 – 2017-02-11 (×9): 40 mg via ORAL
  Filled 2017-02-02 (×9): qty 1

## 2017-02-02 MED ORDER — GABAPENTIN 400 MG PO CAPS
800.0000 mg | ORAL_CAPSULE | Freq: Four times a day (QID) | ORAL | Status: DC
  Administered 2017-02-02: 400 mg via ORAL
  Administered 2017-02-02 – 2017-02-03 (×6): 800 mg via ORAL
  Filled 2017-02-02 (×9): qty 2

## 2017-02-02 MED ORDER — PIPERACILLIN-TAZOBACTAM 3.375 G IVPB
3.3750 g | Freq: Three times a day (TID) | INTRAVENOUS | Status: DC
  Administered 2017-02-02 – 2017-02-05 (×10): 3.375 g via INTRAVENOUS
  Filled 2017-02-02 (×13): qty 50

## 2017-02-02 MED ORDER — ASPIRIN EC 81 MG PO TBEC
81.0000 mg | DELAYED_RELEASE_TABLET | Freq: Every day | ORAL | Status: DC
  Administered 2017-02-02 – 2017-02-07 (×5): 81 mg via ORAL
  Filled 2017-02-02 (×5): qty 1

## 2017-02-02 MED ORDER — OXCARBAZEPINE 300 MG PO TABS
600.0000 mg | ORAL_TABLET | Freq: Two times a day (BID) | ORAL | Status: DC
  Administered 2017-02-02 – 2017-02-03 (×3): 600 mg via ORAL
  Filled 2017-02-02 (×5): qty 2

## 2017-02-02 MED ORDER — NICOTINE 21 MG/24HR TD PT24
21.0000 mg | MEDICATED_PATCH | Freq: Every day | TRANSDERMAL | Status: DC
  Administered 2017-02-02 – 2017-02-11 (×10): 21 mg via TRANSDERMAL
  Filled 2017-02-02 (×10): qty 1

## 2017-02-02 MED ORDER — MORPHINE SULFATE (PF) 4 MG/ML IV SOLN
2.0000 mg | INTRAVENOUS | Status: DC | PRN
  Administered 2017-02-02: 10:00:00 via INTRAVENOUS
  Administered 2017-02-02 – 2017-02-05 (×6): 2 mg via INTRAVENOUS
  Filled 2017-02-02 (×7): qty 1

## 2017-02-02 MED ORDER — PREMIER PROTEIN SHAKE
11.0000 [oz_av] | Freq: Two times a day (BID) | ORAL | Status: DC
  Administered 2017-02-02 – 2017-02-04 (×2): 11 [oz_av] via ORAL
  Filled 2017-02-02 (×15): qty 325.31

## 2017-02-02 MED ORDER — ZOLPIDEM TARTRATE 5 MG PO TABS: 5.0000 mg | ORAL_TABLET | Freq: Every evening | ORAL | Status: DC | PRN

## 2017-02-02 MED ORDER — LURASIDONE HCL 40 MG PO TABS
120.0000 mg | ORAL_TABLET | Freq: Every day | ORAL | Status: DC
  Administered 2017-02-02 – 2017-02-10 (×9): 120 mg via ORAL
  Filled 2017-02-02 (×10): qty 3

## 2017-02-02 MED ORDER — ONDANSETRON HCL 4 MG/2ML IJ SOLN
4.0000 mg | Freq: Four times a day (QID) | INTRAMUSCULAR | Status: DC | PRN
  Administered 2017-02-04 – 2017-02-08 (×3): 4 mg via INTRAVENOUS
  Filled 2017-02-02 (×3): qty 2

## 2017-02-02 MED ORDER — INSULIN ASPART 100 UNIT/ML ~~LOC~~ SOLN
0.0000 [IU] | Freq: Three times a day (TID) | SUBCUTANEOUS | Status: DC
Start: 2017-02-02 — End: 2017-02-11
  Administered 2017-02-02 (×3): 7 [IU] via SUBCUTANEOUS
  Administered 2017-02-03: 5 [IU] via SUBCUTANEOUS
  Administered 2017-02-03: 3 [IU] via SUBCUTANEOUS
  Administered 2017-02-04 – 2017-02-05 (×3): 1 [IU] via SUBCUTANEOUS
  Administered 2017-02-06: 2 [IU] via SUBCUTANEOUS
  Administered 2017-02-06: 3 [IU] via SUBCUTANEOUS
  Administered 2017-02-07: 5 [IU] via SUBCUTANEOUS
  Administered 2017-02-08: 2 [IU] via SUBCUTANEOUS
  Administered 2017-02-10: 3 [IU] via SUBCUTANEOUS
  Administered 2017-02-11: 2 [IU] via SUBCUTANEOUS

## 2017-02-02 MED ORDER — ALPRAZOLAM 0.5 MG PO TABS
2.0000 mg | ORAL_TABLET | Freq: Two times a day (BID) | ORAL | Status: DC
  Administered 2017-02-02 – 2017-02-03 (×3): 2 mg via ORAL
  Filled 2017-02-02: qty 4
  Filled 2017-02-02: qty 2
  Filled 2017-02-02 (×2): qty 4
  Filled 2017-02-02: qty 2

## 2017-02-02 MED ORDER — METOPROLOL TARTRATE 25 MG PO TABS
25.0000 mg | ORAL_TABLET | Freq: Two times a day (BID) | ORAL | Status: DC
  Administered 2017-02-02 – 2017-02-03 (×3): 25 mg via ORAL
  Filled 2017-02-02 (×4): qty 1

## 2017-02-02 MED ORDER — ALBUTEROL SULFATE (2.5 MG/3ML) 0.083% IN NEBU: 2.5000 mg | INHALATION_SOLUTION | Freq: Four times a day (QID) | RESPIRATORY_TRACT | Status: DC | PRN

## 2017-02-02 MED ORDER — MORPHINE SULFATE (PF) 4 MG/ML IV SOLN
1.0000 mg | INTRAVENOUS | Status: DC | PRN
  Administered 2017-02-02: 1 mg via INTRAVENOUS
  Filled 2017-02-02: qty 1

## 2017-02-02 MED ORDER — MOMETASONE FURO-FORMOTEROL FUM 100-5 MCG/ACT IN AERO
2.0000 | INHALATION_SPRAY | Freq: Two times a day (BID) | RESPIRATORY_TRACT | Status: DC
  Administered 2017-02-02 – 2017-02-11 (×12): 2 via RESPIRATORY_TRACT
  Filled 2017-02-02 (×2): qty 8.8

## 2017-02-02 MED ORDER — MORPHINE SULFATE (PF) 4 MG/ML IV SOLN
2.0000 mg | Freq: Once | INTRAVENOUS | Status: AC
  Administered 2017-02-02: 2 mg via INTRAVENOUS
  Filled 2017-02-02: qty 1

## 2017-02-02 MED ORDER — KETOROLAC TROMETHAMINE 15 MG/ML IJ SOLN
15.0000 mg | Freq: Four times a day (QID) | INTRAMUSCULAR | Status: DC | PRN
Start: 2017-02-02 — End: 2017-02-03
  Administered 2017-02-02 – 2017-02-03 (×3): 15 mg via INTRAVENOUS
  Filled 2017-02-02 (×3): qty 1

## 2017-02-02 MED ORDER — INSULIN GLARGINE 100 UNIT/ML ~~LOC~~ SOLN
60.0000 [IU] | Freq: Two times a day (BID) | SUBCUTANEOUS | Status: DC
  Administered 2017-02-02 (×2): 60 [IU] via SUBCUTANEOUS
  Filled 2017-02-02 (×3): qty 0.6

## 2017-02-02 MED ORDER — ADULT MULTIVITAMIN W/MINERALS CH
1.0000 | ORAL_TABLET | Freq: Every day | ORAL | Status: DC
  Administered 2017-02-02 – 2017-02-11 (×10): 1 via ORAL
  Filled 2017-02-02 (×9): qty 1

## 2017-02-02 MED ORDER — SODIUM CHLORIDE 0.9 % IV SOLN
INTRAVENOUS | Status: AC
  Administered 2017-02-02: 05:00:00 via INTRAVENOUS

## 2017-02-02 MED ORDER — ONDANSETRON HCL 4 MG PO TABS
4.0000 mg | ORAL_TABLET | Freq: Four times a day (QID) | ORAL | Status: DC | PRN
  Administered 2017-02-05: 4 mg via ORAL
  Filled 2017-02-02: qty 1

## 2017-02-02 NOTE — Progress Notes (Signed)
Pt walked off the unit with her daughter after being told she could only walk the halls of 5E - found at the [facility] and I advised her again to stay  On 5E - pt was found walking out from lobby bathroom & as I approached her I could smell that she smoked a cigarette. Instructed her that was a hospital violation. Pt appears groggy ( eyes 1/2 open ) c/o left leg & foot pain. Returned to bed and advised pt & family that smoking in the hospital is a serious violation. Pt is requesting a nicotine patch

## 2017-02-02 NOTE — Consult Note (Signed)
[facility] [place]  Patient name: Kathy Phelps MRN: [id number] DOB: [birth year] Sex: female  REASON FOR CONSULT: Cellulitis and ischemia left foot  HPI: Kathy Phelps is a 36 y.o. female, who is seen for cellulitis and ischemia of her left foot. She is a very poor historian. She has several family members present as well. She is complaining of pain and is insisting on more pain medication with the nurse but then is difficult to arouse during my discussion with her. She has had cellulitis and ischemic changes for some time. She gives different answers when asked him how long this is been going on but perhaps is at least a month. 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 She has a history of smoking apparently since she was 36 years old. Has a very poorly controlled diabetes. Does say yes when questioned about calf claudication but is varying degrees of answers as far as how long this is been present. No right leg symptoms.  Past Medical History:  Diagnosis Date  . Bipolar affective ([place])   . Complete miscarriage   . Depression   . Diabetes mellitus   . Hyperlipidemia   . Hypertension   . Pregnancy complication    HELP Syndrome  . Renal disorder   . Schizophrenia ([place])     Family History  Problem Relation Age of Onset  . Diabetes Mellitus II Mother     SOCIAL HISTORY: Social History   Social History  . Marital status: Single    Spouse name: N/A  . Number of children: N/A  . Years of education: N/A   Occupational History  . Not on file.   Social History Main Topics  . Smoking status: Current Every Day Smoker    Packs/day: 1.00    Years: 28.00    Types: Cigarettes  . Smokeless tobacco: Never Used  . Alcohol use No  . Drug use: No  . Sexual activity: Yes    Birth control/ protection: None   Other Topics Concern  . Not on file   Social  History Narrative  . No narrative on file    Allergies  Allergen Reactions  . Hydrocodone-Acetaminophen Hives  . Tylenol [Acetaminophen] Itching and Swelling    Current Facility-Administered Medications  Medication Dose Route Frequency Provider Last Rate Last Dose  . 0.9 %  sodium chloride infusion   Intravenous Continuous Rise Patience, MD 50 mL/hr at 02/02/17 [phone]    . albuterol (PROVENTIL) (2.5 MG/3ML) 0.083% nebulizer solution 2.5 mg  2.5 mg Nebulization Q6H PRN Rise Patience, MD      . ALPRAZolam Duanne Moron) tablet 2 mg  2 mg Oral BID Rise Patience, MD   2 mg at 02/02/17 1024  . aspirin EC tablet 81 mg  81 mg Oral Daily Rise Patience, MD   81 mg at 02/02/17 1024  . feeding supplement (ENSURE ENLIVE) (ENSURE ENLIVE) liquid 237 mL  237 mL Oral BID BM Rise Patience, MD   237 mL at 02/02/17 1517  . gabapentin (NEURONTIN) capsule 800 mg  800 mg Oral QID Rise Patience, MD   800 mg at 02/02/17 1338  . hydrALAZINE (APRESOLINE) injection 10 mg  10 mg Intravenous Q4H PRN Rise Patience, MD      . insulin aspart (novoLOG) injection 0-9 Units  0-9 Units Subcutaneous TID WC Rise Patience,  MD   7 Units at 02/02/17 1215  . insulin glargine (LANTUS) injection 60 Units  60 Units Subcutaneous BID Rise Patience, MD   60 Units at 02/02/17 1045  . ketorolac (TORADOL) 15 MG/ML injection 15 mg  15 mg Intravenous Q6H PRN Tawni Millers, MD   15 mg at 02/02/17 1322  . lurasidone (LATUDA) tablet 120 mg  120 mg Oral QHS Rise Patience, MD      . metoprolol tartrate (LOPRESSOR) tablet 25 mg  25 mg Oral BID Rise Patience, MD   25 mg at 02/02/17 1024  . mometasone-formoterol (DULERA) 100-5 MCG/ACT inhaler 2 puff  2 puff Inhalation BID Rise Patience, MD   2 puff at 02/02/17 0845  . morphine 4 MG/ML injection 2 mg  2 mg Intravenous Q3H PRN Gardiner Barefoot, NP   2 mg at 02/02/17 1338  . multivitamin with minerals tablet 1 tablet  1 tablet  Oral Daily Mauricio Gerome Apley, MD   1 tablet at 02/02/17 1523  . nicotine (NICODERM CQ - dosed in mg/24 hours) patch 21 mg  21 mg Transdermal Daily Mauricio Gerome Apley, MD   21 mg at 02/02/17 1519  . ondansetron (ZOFRAN) tablet 4 mg  4 mg Oral Q6H PRN Rise Patience, MD       Or  . ondansetron [facility]) injection 4 mg  4 mg Intravenous Q6H PRN Rise Patience, MD      . Oxcarbazepine (TRILEPTAL) tablet 600 mg  600 mg Oral BID Rise Patience, MD   600 mg at 02/02/17 1024  . piperacillin-tazobactam (ZOSYN) IVPB 3.375 g  3.375 g Intravenous Q8H Rise Patience, MD 12.5 mL/hr at 02/02/17 1521 3.375 g at 02/02/17 1521  . protein supplement (PREMIER PROTEIN) liquid  11 oz Oral BID BM Tawni Millers, MD   11 oz at 02/02/17 1526  . simvastatin (ZOCOR) tablet 40 mg  40 mg Oral Daily Rise Patience, MD   40 mg at 02/02/17 1024  . vancomycin (VANCOCIN) IVPB 1000 mg/200 mL premix  1,000 mg Intravenous Q12H Rise Patience, MD   Stopped at 02/02/17 1212  . zolpidem (AMBIEN) tablet 5 mg  5 mg Oral QHS PRN Rise Patience, MD        REVIEW OF SYSTEMS:  Reviewed in her history and physical with nothing to add  PHYSICAL EXAM: Vitals:   02/01/17 2114 02/01/17 2235 02/02/17 0139 02/02/17 0242  BP:  (!) 164/111 (!) 149/102 (!) 139/103  Pulse:  (!) 102 91 87  Resp:  12 18 20   Temp:  98 F (36.7 C) 97.9 F (36.6 C) 98.2 F (36.8 C)  TempSrc:  Oral Oral Oral  SpO2:  100% 100% 100%  Weight:    135 lb (61.2 kg)  Height: 5\' 9"  (1.753 m)       GENERAL: The patient is a well-nourished female, in no acute distress. The vital signs are documented above. CARDIOVASCULAR: 2+ radial 2+ femoral pulses bilaterally. 2+ dorsalis pedis and posterior tibial pulse on the right. I do not palpate a left popliteal or distal pulses. PULMONARY: There is good air exchange  ABDOMEN: Soft and non-tender  MUSCULOSKELETAL: There are no major deformities or cyanosis. NEUROLOGIC: No  focal weakness or paresthesias are detected. SKIN: Rubor is changes on the dorsum of her foot. 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  DATA:  Noninvasive studies showed normal ankle arm index on the right. On the left she has ankle arm index of 0.57 with biphasic posterior tibial signal  MEDICAL ISSUES: Cellulitis left foot with arterial insufficiency. Explained she will require arteriography with further evaluation and revascularization depending the arteriogram. Discussed with Dr.Arrien.  He will arrange transfer to the hospitalist service at [facility] and we will proceed with arteriography tomorrow with further recommendations to follow   Rosetta Posner, MD [facility] [phone] Pager [phone]

## 2017-02-02 NOTE — H&P (Signed)
History and Physical    Kathy Phelps YNW:[id number] DOB: [birth year] DOA: 02/01/2017  PCP: Rubbie Battiest, NP  Patient coming from: Home.  Chief Complaint: Left foot pain.  HPI: Kathy Phelps is a 36 y.o. female with history of diabetes mellitus presents to the ER with complaints of increasing pain and discoloration of left foot lateral 3 toes. Patient states the symptoms started with lateral most toe a month ago and had gone to [facility] ER and was prescribed 10 days of antibiotics. Despite taking which patient's symptoms gradually worsened. Start involving other medial toes. Denies any fever or chills. Denies any trauma.   ED Course: On examining the ER patient has severe tenderness of the foot with erythema and ischemic looking lateral 3 toes. Has feeble dorsalis pedis pulse. X-rays do not show any acute bony involvement. Patient is being admitted for cellulitis with possible ischemic foot and osteomyelitis.  Review of Systems: As per HPI, rest all negative.   Past Medical History:  Diagnosis Date  . Bipolar affective ([place])   . Complete miscarriage   . Depression   . Diabetes mellitus   . Hyperlipidemia   . Hypertension   . Pregnancy complication    HELP Syndrome  . Renal disorder   . Schizophrenia [facility])     Past Surgical History:  Procedure Laterality Date  . CESAREAN SECTION    . CHOLECYSTECTOMY    . TUBAL LIGATION       reports that she has been smoking Cigarettes.  She has a 28.00 pack-year smoking history. She has never used smokeless tobacco. She reports that she does not drink alcohol or use drugs.  Allergies  Allergen Reactions  . Hydrocodone-Acetaminophen Hives  . Tylenol [Acetaminophen] Itching and Swelling    Family History  Problem Relation Age of Onset  . Diabetes Mellitus II Mother     Prior to Admission medications   Medication Sig Start Date End Date Taking? Authorizing Provider  ADVAIR DISKUS 100-50 MCG/DOSE AEPB Inhale 1  puff into the lungs daily. 01/10/15  Yes Historical Provider, MD  alprazolam Duanne Moron) 2 MG tablet Take 2 mg by mouth 2 (two) times daily.   Yes Historical Provider, MD  ASPIRIN LOW DOSE 81 MG EC tablet Take 81 mg by mouth daily. 01/10/15  Yes Historical Provider, MD  gabapentin (NEURONTIN) 800 MG tablet Take 800 mg by mouth 4 (four) times daily.   Yes Historical Provider, MD  LANTUS 100 UNIT/ML injection Inject 60 Units into the skin 2 (two) times daily.  01/10/15  Yes Historical Provider, MD  LATUDA 120 MG TABS Take 120 mg by mouth at bedtime. 04/12/16  Yes Historical Provider, MD  metFORMIN (GLUCOPHAGE) 1000 MG tablet Take 1 tablet (1,000 mg total) by mouth 2 (two) times daily. Patient taking differently: Take 1,500 mg by mouth 2 (two) times daily.  07/16/14  Yes Nicole Pisciotta, PA-C  metoprolol tartrate (LOPRESSOR) 25 MG tablet Take 25 mg by mouth 2 (two) times daily.  01/06/15  Yes Historical Provider, MD  NOVOLOG 100 UNIT/ML injection Inject 0-30 Units into the skin 3 (three) times daily with meals. Sliding Scale:  >300 15 units < 300 do not use  01/10/15  Yes Historical Provider, MD  oxcarbazepine (TRILEPTAL) 600 MG tablet Take 600 mg by mouth 2 (two) times daily. 04/12/16  Yes Historical Provider, MD  PROAIR HFA 108 (90 BASE) MCG/ACT inhaler Inhale 2 puffs into the lungs every 6 (six) hours as needed for shortness of breath.  01/10/15  Yes Historical Provider, MD  simvastatin (ZOCOR) 40 MG tablet Take 40 mg by mouth daily. 04/25/16  Yes Historical Provider, MD  tamsulosin (FLOMAX) 0.4 MG CAPS capsule Take 0.4 mg by mouth daily. 04/12/16  Yes Historical Provider, MD  zolpidem (AMBIEN) 10 MG tablet Take 10 mg by mouth at bedtime as needed for sleep. 04/26/16  Yes Historical Provider, MD  naproxen (NAPROSYN) 500 MG tablet Take 1 tablet (500 mg total) by mouth 2 (two) times daily as needed for mild pain or moderate pain. Patient not taking: Reported on 02/01/2017 05/23/16   Duffy Bruce, MD  oxyCODONE (ROXICODONE)  5 MG immediate release tablet Take 1 tablet (5 mg total) by mouth every 6 (six) hours as needed for severe pain. Patient not taking: Reported on 02/01/2017 05/23/16   Duffy Bruce, MD    Physical Exam: Vitals:   02/01/17 2114 02/01/17 2235 02/02/17 0139 02/02/17 0242  BP:  (!) 164/111 (!) 149/102 (!) 139/103  Pulse:  (!) 102 91 87  Resp:  12 18 20   Temp:  98 F (36.7 C) 97.9 F (36.6 C) 98.2 F (36.8 C)  TempSrc:  Oral Oral Oral  SpO2:  100% 100% 100%  Weight:    61.2 kg (135 lb)  Height: 5\' 9"  (1.753 m)         Constitutional: Moderately built and nourished. Vitals:   02/01/17 2114 02/01/17 2235 02/02/17 0139 02/02/17 0242  BP:  (!) 164/111 (!) 149/102 (!) 139/103  Pulse:  (!) 102 91 87  Resp:  12 18 20   Temp:  98 F (36.7 C) 97.9 F (36.6 C) 98.2 F (36.8 C)  TempSrc:  Oral Oral Oral  SpO2:  100% 100% 100%  Weight:    61.2 kg (135 lb)  Height: 5\' 9"  (1.753 m)      Eyes: Anicteric no pallor. ENMT: No discharge from the ears eyes nose and mouth. Neck: No mass felt. No neck rigidity. Respiratory: No rhonchi or crepitations. Cardiovascular: S1 and S2 heard no murmurs appreciated. Abdomen: Soft nontender bowel sounds present. Musculoskeletal:   Left foot lateral 3 toes look ischemic. Skin:  Left foot lateral 3 toes look ischemic with erythema. Neurologic: alert awake oriented to time place and person.Moves all extremities. Psychiatric: Appears normal. Normal affect.   Labs on Admission: I have personally reviewed following labs and imaging studies  CBC:  Recent Labs Lab 02/01/17 2343  WBC 10.7*  NEUTROABS 5.7  HGB 12.5  HCT 35.9*  MCV 85.9  PLT 329   Basic Metabolic Panel:  Recent Labs Lab 02/01/17 2343  NA 134*  K 3.5  CL 100*  CO2 28  GLUCOSE 319*  BUN 10  CREATININE 0.92  CALCIUM 8.4*   GFR: Estimated Creatinine Clearance: 80.9 mL/min (by C-G formula based on SCr of 0.92 mg/dL). Liver Function Tests: No results for input(s): AST, ALT,  ALKPHOS, BILITOT, PROT, ALBUMIN in the last 168 hours. No results for input(s): LIPASE, AMYLASE in the last 168 hours. No results for input(s): AMMONIA in the last 168 hours. Coagulation Profile: No results for input(s): INR, PROTIME in the last 168 hours. Cardiac Enzymes: No results for input(s): CKTOTAL, CKMB, CKMBINDEX, TROPONINI in the last 168 hours. BNP (last 3 results) No results for input(s): PROBNP in the last 8760 hours. HbA1C: No results for input(s): HGBA1C in the last 72 hours. CBG:  Recent Labs Lab 02/01/17 2235  GLUCAP 338*   Lipid Profile: No results for input(s): CHOL, HDL, LDLCALC, TRIG, CHOLHDL,  LDLDIRECT in the last 72 hours. Thyroid Function Tests: No results for input(s): TSH, T4TOTAL, FREET4, T3FREE, THYROIDAB in the last 72 hours. Anemia Panel: No results for input(s): VITAMINB12, FOLATE, FERRITIN, TIBC, IRON, RETICCTPCT in the last 72 hours. Urine analysis:    Component Value Date/Time   COLORURINE YELLOW 05/23/2016 0107   APPEARANCEUR CLOUDY (A) 05/23/2016 0107   LABSPEC 1.035 (H) 05/23/2016 0107   PHURINE 5.0 05/23/2016 0107   GLUCOSEU >1000 (A) 05/23/2016 0107   HGBUR TRACE (A) 05/23/2016 0107   BILIRUBINUR NEGATIVE 05/23/2016 0107   KETONESUR NEGATIVE 05/23/2016 0107   PROTEINUR NEGATIVE 05/23/2016 0107   UROBILINOGEN 1.0 02/26/2015 1152   NITRITE POSITIVE (A) 05/23/2016 0107   LEUKOCYTESUR NEGATIVE 05/23/2016 0107   Sepsis Labs: @LABRCNTIP (procalcitonin:4,lacticidven:4) )No results found for this or any previous visit (from the past 240 hour(s)).   Radiological Exams on Admission: Dg Foot Complete Left  Result Date: 02/01/2017 CLINICAL DATA:  LEFT fifth toe pain for 3 weeks, gangrenous for 2 days. EXAM: LEFT FOOT - COMPLETE 3+ VIEW COMPARISON:  LEFT foot radiographs January 10, 2017 FINDINGS: No acute fracture deformity or dislocation. No destructive bony lesions. Soft tissue planes are not suspicious. IMPRESSION: Negative. Electronically  Signed   By: Elon Alas M.D.   On: 02/01/2017 22:55     Assessment/Plan Principal Problem:   Cellulitis of left foot Active Problems:   Uncontrolled type 2 diabetes mellitus with hyperglycemia (HCC)   HLD (hyperlipidemia)   Tobacco abuse   Essential hypertension    1. Left foot cellulitis with possible osteomyelitis and peripheral vascular disease - I have placed patient on vancomycin and Zosyn. Check MRI of the left foot. Check ABI. Will need vascular surgery or orthopedic consult in a.m. 2. Diabetes mellitus type 2 with hyperglycemia - patient states she has been compliant with her Lantus dose. Check hemoglobin A1c. Continue Lantus with sliding scale coverage. 3. Hypertension - on metoprolol. I have added when necessary IV hydralazine as patient's blood pressure looks uncontrolled. 4. Tobacco abuse - tobacco cessation counseling requested. 5. Hyperlipidemia on statins. 6. Bipolar disorder - continue present medications.  Pregnancy screen is pending.   DVT prophylaxis: SCDs. Code Status: Full code.  Family Communication: Family at the bedside.  Disposition Plan: Home.  Consults called: None.  Admission status: Inpatient.    Rise Patience MD [facility] Pager [phone].  If 7PM-7AM, please contact night-coverage [email or web address] Password [facility]  02/02/2017, 4:48 AM

## 2017-02-02 NOTE — Progress Notes (Signed)
VASCULAR LAB PRELIMINARY  ARTERIAL  ABI completed:    RIGHT    LEFT    PRESSURE WAVEFORM  PRESSURE WAVEFORM  BRACHIAL 155 Tri BRACHIAL 158 Tri  DP 168 Tri DP 90 Mono  PT 160 Tri PT 80 Bi  GREAT TOE 173 NA GREAT TOE - NA    RIGHT LEFT  ABI 1.06 0.57   Right ABI is within normal range, no evidence of PAD.  Left ABI indicated moderate peripheral arterial disease.  Toe pressures attempted, left PPG severely dampened making it too difficult to obtain an accurate toe pressure on the left great toe.  Everrett Coombe, RVT 02/02/2017, 10:28 AM

## 2017-02-02 NOTE — Progress Notes (Signed)
Initial Nutrition Assessment  DOCUMENTATION CODES:   Non-severe (moderate) malnutrition in context of chronic illness  INTERVENTION:   Premier Protein BID, each supplement provides 160kcal and 30g protein.   MVI  NUTRITION DIAGNOSIS:   Malnutrition (moderate) related to chronic illness, uncontrolled diabetes as evidenced by moderate depletions of muscle mass, moderate depletion of body fat.  GOAL:   Patient will meet greater than or equal to 90% of their needs  MONITOR:   PO intake, Supplement acceptance, Labs, Weight trends  REASON FOR ASSESSMENT:   Malnutrition Screening Tool    ASSESSMENT:    36 y.o. female with history of diabetes mellitus presents to the ER with complaints of increasing pain and discoloration of left foot lateral 3 toes. Left foot cellulitis with possible osteomyelitis and peripheral vascular disease   Met with pt in room today. Pt upset because she is unable to go outside and smoke. Pt reports intermittent poor appetite and oral intake for several months pta. Pt reports that she usually only eats one meal a day and sometimes she doesn't eat anything for days. Pt reports 30lbs recent wt loss. RD discussed with pt the importance of adequate protein intake for wound healing. Pt would benefit from diabetes education prior to discharge. Pt with possible dental abscess on lower jaw. Pt reports that this bothers her and deters her from eating. Pt also reports severe reflux and difficulty swallowing food sometimes. RD encouraged pt to mention this to MD.      Medications reviewed and include: aspirin, insulin, zosyn, vancomycin, morphine  Labs reviewed: Cl 99(L), Ca 8.2(L) adj. 9.32 wnl, alb 2.6(L) cbgs- 319, 302 x 24 hrs  Nutrition-Focused physical exam completed. Findings are moderate fat depletion in arms and chest, moderate muscle depletion in clavicles, shoulders, and hands, and no edema.   Diet Order:  Diet heart healthy/carb modified Room service  appropriate? Yes; Fluid consistency: Thin  Skin:  Wound (see comment) (diabetic foot ulcers)  Last BM:  none since admit  Height:   Ht Readings from Last 1 Encounters:  02/01/17 '5\' 9"'  (1.753 m)    Weight:   Wt Readings from Last 1 Encounters:  02/02/17 135 lb (61.2 kg)    Ideal Body Weight:  65.9 kg  BMI:  Body mass index is 19.94 kg/m.  Estimated Nutritional Needs:   Kcal:  1800-2100kcal/day   Protein:  80-92g/day   Fluid:  >1.8L/day   EDUCATION NEEDS:   No education needs identified at this time  Koleen Distance, RD, LDN Pager #[phone]

## 2017-02-02 NOTE — Progress Notes (Addendum)
PROGRESS NOTE    Kathy Phelps  CBJ:[id number] DOB: 03-06-1981 DOA: 02/01/2017 PCP: Rubbie Battiest, NP     Brief Narrative:  36 yo female who presented to the hospital with the chief complain of left foot pain. Worsening symptoms for the last 4 weeks, refractive to outpatient oral antibiotic therapy. Initial evaluation patient found tachycardic. Left foot with significant erythema. Admitted for cellulitis to rule out deep infection.    Assessment & Plan:   Principal Problem:   Cellulitis of left foot Active Problems:   Uncontrolled type 2 diabetes mellitus with hyperglycemia (HCC)   HLD (hyperlipidemia)   Tobacco abuse   Essential hypertension   1. Left foot cellulitis with possible gangrene.  Will continue antibiotic therapy with vancomycin and zosyn IV, will follow on cell count, cultures and temperature curve. Noted cyanotic toes on the left, palpable pulses. Will follow on doppler studies, serum lactate and will consult vascular surgery. Pain control with ketorolac and metformin. Gentle hydration with saline. Echocardiography to rule out embolic phenomena.   2. T2DM. Will continue glucose cover and monitoring, basal insulin with 60 units of glargine, capillary glucose in the 300's. Gabapentin for pain control.   3. HTN. Continue blood pressure control with metoprolol.   4. Tobacco abuse, Will add nicotine inhaler  5. Dyslipidemia. Continue statin therapy with simvastatin.   6. Bipolar disorder. Continue trileptal. latuda and alprazolam.   7. Right submandibular mass. Lesion self drained per patient's report, will follow local soft tissue ultrasound.   Late entry: Patient will be transferred to [facility] for arteriography and possible surgical intervention.   DVT prophylaxis: scd  Code Status: full  Family Communication: No family at the bedside  Disposition Plan: Home   Consultants:     Procedures:      Antimicrobials:   Vancomycin  Zosyn      Subjective: Patient with pain on the left foot, moderate to severe, had toe cyanosis in the past. No nausea or vomiting, positive somnolence.   Objective: Vitals:   02/01/17 2114 02/01/17 2235 02/02/17 0139 02/02/17 0242  BP:  (!) 164/111 (!) 149/102 (!) 139/103  Pulse:  (!) 102 91 87  Resp:  12 18 20   Temp:  98 F (36.7 C) 97.9 F (36.6 C) 98.2 F (36.8 C)  TempSrc:  Oral Oral Oral  SpO2:  100% 100% 100%  Weight:    61.2 kg (135 lb)  Height: 5\' 9"  (1.753 m)       Intake/Output Summary (Last 24 hours) at 02/02/17 1225 Last data filed at 02/02/17 0700  Gross per 24 hour  Intake              150 ml  Output                0 ml  Net              150 ml   Filed Weights   02/02/17 0242  Weight: 61.2 kg (135 lb)    Examination:  General exam: deconditioned and ill looking appearing E ENT: mild pallor, no icterus.  Respiratory system: Clear to auscultation. Respiratory effort normal. Cardiovascular system: S1 & S2 heard, RRR. No JVD, murmurs, rubs, gallops or clicks. No pedal edema. Gastrointestinal system: Abdomen is nondistended, soft and nontender. No organomegaly or masses felt. Normal bowel sounds heard. Central nervous system: Alert and oriented. No focal neurological deficits. Extremities: Symmetric 5 x 5 power. Skin: Left foot with 3th and 4th toe discoloration, cyanotic with  decrease local temperature, pulses are palpable. Noted for foot erythema, expanding to the leg, with ill defined margins.  .     Data Reviewed: I have personally reviewed following labs and imaging studies  CBC:  Recent Labs Lab 02/01/17 2343 02/02/17 0615  WBC 10.7* 9.5  NEUTROABS 5.7 4.5  HGB 12.5 12.0  HCT 35.9* 34.7*  MCV 85.9 86.1  PLT 236 563   Basic Metabolic Panel:  Recent Labs Lab 02/01/17 2343 02/02/17 0615  NA 134* 135  K 3.5 3.6  CL 100* 99*  CO2 28 28  GLUCOSE 319* 302*  BUN 10 11  CREATININE 0.92 0.87  CALCIUM 8.4* 8.2*   GFR: Estimated Creatinine  Clearance: 85.5 mL/min (by C-G formula based on SCr of 0.87 mg/dL). Liver Function Tests:  Recent Labs Lab 02/02/17 0615  AST 40  ALT 10*  ALKPHOS 63  BILITOT 0.3  PROT 6.1*  ALBUMIN 2.6*   No results for input(s): LIPASE, AMYLASE in the last 168 hours. No results for input(s): AMMONIA in the last 168 hours. Coagulation Profile: No results for input(s): INR, PROTIME in the last 168 hours. Cardiac Enzymes: No results for input(s): CKTOTAL, CKMB, CKMBINDEX, TROPONINI in the last 168 hours. BNP (last 3 results) No results for input(s): PROBNP in the last 8760 hours. HbA1C: No results for input(s): HGBA1C in the last 72 hours. CBG:  Recent Labs Lab 02/01/17 2235 02/02/17 0821 02/02/17 1202  GLUCAP 338* 329* 304*   Lipid Profile: No results for input(s): CHOL, HDL, LDLCALC, TRIG, CHOLHDL, LDLDIRECT in the last 72 hours. Thyroid Function Tests: No results for input(s): TSH, T4TOTAL, FREET4, T3FREE, THYROIDAB in the last 72 hours. Anemia Panel: No results for input(s): VITAMINB12, FOLATE, FERRITIN, TIBC, IRON, RETICCTPCT in the last 72 hours. Sepsis Labs: No results for input(s): PROCALCITON, LATICACIDVEN in the last 168 hours.  No results found for this or any previous visit (from the past 240 hour(s)).       Radiology Studies: Mr Foot Left Wo Contrast  Result Date: 02/02/2017 CLINICAL DATA:  Increasing pain and discoloration of left foot lateral 3 toes, gangrenous for 2 days. Patient states the symptoms started with lateral most toe a month ago. EXAM: MRI OF THE LEFT FOOT WITHOUT CONTRAST TECHNIQUE: Multiplanar, multisequence MR imaging of the left forefoot was performed. No intravenous contrast was administered. COMPARISON:  None. FINDINGS: Bones/Joint/Cartilage No fracture or dislocation. Normal alignment. No joint effusion. Mild osteoarthritis of the first MTP joint. Ligaments Ligaments are suboptimally evaluated by CT. Lisfranc ligament intact. Muscles and Tendons  Visualized flexor and extensor compartment tendons are intact. Edema in the plantar musculature which may be neurogenic given the patient's history of diabetes versus less likely myositis. Soft tissue No fluid collection or hematoma. No soft tissue mass. Mild soft tissue edema along the dorsal aspect of the forefoot. IMPRESSION: 1. No evidence of osteomyelitis of the left foot. 2. Mild soft tissue edema along the dorsal aspect of the forefoot as can be seen with mild cellulitis. Electronically Signed   By: Kathreen Devoid   On: 02/02/2017 08:52   Dg Foot Complete Left  Result Date: 02/01/2017 CLINICAL DATA:  LEFT fifth toe pain for 3 weeks, gangrenous for 2 days. EXAM: LEFT FOOT - COMPLETE 3+ VIEW COMPARISON:  LEFT foot radiographs January 10, 2017 FINDINGS: No acute fracture deformity or dislocation. No destructive bony lesions. Soft tissue planes are not suspicious. IMPRESSION: Negative. Electronically Signed   By: Elon Alas M.D.   On: 02/01/2017 22:55  Scheduled Meds: . alprazolam  2 mg Oral BID  . aspirin EC  81 mg Oral Daily  . feeding supplement (ENSURE ENLIVE)  237 mL Oral BID BM  . gabapentin  800 mg Oral QID  . insulin aspart  0-9 Units Subcutaneous TID WC  . insulin glargine  60 Units Subcutaneous BID  . lurasidone  120 mg Oral QHS  . metoprolol tartrate  25 mg Oral BID  . mometasone-formoterol  2 puff Inhalation BID  . oxcarbazepine  600 mg Oral BID  . simvastatin  40 mg Oral Daily   Continuous Infusions: . sodium chloride 50 mL/hr at 02/02/17 0829  . piperacillin-tazobactam (ZOSYN)  IV 3.375 g (02/02/17 0829)  . vancomycin 1,000 mg (02/02/17 1112)     LOS: 0 days      Avyn Coate Gerome Apley, MD [facility] Pager [phone]  If 7PM-7AM, please contact night-coverage [email or web address] Password [facility] 02/02/2017, 12:25 PM

## 2017-02-02 NOTE — Progress Notes (Signed)
Vascular Dr. Donnetta Hutching in to eval pt , [place] of rt jaw /neck completed

## 2017-02-02 NOTE — Progress Notes (Signed)
Report has been called, pt informed that she will be leaving to be transferred to 2W at [facility] room 33. [facility] arrived to transport pt. She appears stable at this time.

## 2017-02-02 NOTE — Care Management Note (Signed)
Case Management Note  Patient Details  Name: Kathy Phelps MRN: [id number] Date of Birth: 1981-03-16  Subjective/Objective:           Gangrene of left little toe and dka         Action/Plan: Date:  February 02, 2017 Chart reviewed for concurrent status and case management needs. Will continue to follow patient progress. Discharge Planning: following for needs Expected discharge date: [id number] Velva Harman, BSN, [place], [place]  Expected Discharge Date:                  Expected Discharge Plan:  Home/Self Care  In-House Referral:     Discharge planning Services     Post Acute Care Choice:    Choice offered to:     DME Arranged:    DME Agency:     HH Arranged:    [place] Agency:     Status of Service:  In process, will continue to follow  If discussed at Long Length of Stay Meetings, dates discussed:    Additional Comments:  Leeroy Cha, RN 02/02/2017, 10:09 AM

## 2017-02-02 NOTE — Progress Notes (Signed)
Pharmacy Antibiotic Note  Kathy Phelps is a 36 y.o. female admitted on 02/01/2017 with cellulitis.  Pharmacy has been consulted for Vancomycin and Zosyn dosing.  Plan: Vancomycin 1gm IV every 12 hours.  Goal trough 10-15 mcg/mL. Zosyn 3.375g IV q8h (4 hour infusion).  Height: 5\' 9"  (175.3 cm) Weight: 135 lb (61.2 kg) IBW/kg (Calculated) : 66.2  Temp (24hrs), Avg:98.1 F (36.7 C), Min:97.9 F (36.6 C), Max:98.2 F (36.8 C)   Recent Labs Lab 02/01/17 2343  WBC 10.7*  CREATININE 0.92    Estimated Creatinine Clearance: 80.9 mL/min (by C-G formula based on SCr of 0.92 mg/dL).    Allergies  Allergen Reactions  . Hydrocodone-Acetaminophen Hives  . Tylenol [Acetaminophen] Itching and Swelling    Antimicrobials this admission: Vancomycin 02/02/2017 >> Zosyn 02/01/2017 >>   Dose adjustments this admission: -  Microbiology results: pending  Thank you for allowing pharmacy to be a part of this patient's care.  Nani Skillern Crowford 02/02/2017 5:31 AM

## 2017-02-03 ENCOUNTER — Inpatient Hospital Stay (HOSPITAL_COMMUNITY)
Admission: EM | Disposition: A | Discharge status: Home Health | Payer: Self-pay | Source: Home / Self Care | Attending: Internal Medicine

## 2017-02-03 ENCOUNTER — Encounter (HOSPITAL_COMMUNITY): Payer: Self-pay | Admitting: Vascular Surgery

## 2017-02-03 ENCOUNTER — Other Ambulatory Visit (HOSPITAL_COMMUNITY): Payer: Medicaid Other

## 2017-02-03 DIAGNOSIS — I70262 Atherosclerosis of native arteries of extremities with gangrene, left leg: Secondary | ICD-10-CM

## 2017-02-03 HISTORY — PX: ABDOMINAL AORTOGRAM W/LOWER EXTREMITY: CATH118223

## 2017-02-03 HISTORY — PX: PERIPHERAL VASCULAR ATHERECTOMY: CATH118256

## 2017-02-03 HISTORY — PX: PERIPHERAL VASCULAR BALLOON ANGIOPLASTY: CATH118281

## 2017-02-03 LAB — BASIC METABOLIC PANEL
ANION GAP: 9 (ref 5–15)
BUN: 19 mg/dL (ref 6–20)
CO2: 25 mmol/L (ref 22–32)
Calcium: 8.4 mg/dL — ABNORMAL LOW (ref 8.9–10.3)
Chloride: 99 mmol/L — ABNORMAL LOW (ref 101–111)
Creatinine, Ser: 3.1 mg/dL — ABNORMAL HIGH (ref 0.44–1.00)
GFR calc Af Amer: 21 mL/min — ABNORMAL LOW (ref >60)
GFR, EST NON AFRICAN AMERICAN: 18 mL/min — AB (ref >60)
GLUCOSE: 370 mg/dL — AB (ref 65–99)
POTASSIUM: 4.1 mmol/L (ref 3.5–5.1)
Sodium: 133 mmol/L — ABNORMAL LOW (ref 135–145)

## 2017-02-03 LAB — CBC
HEMATOCRIT: 36.3 % (ref 36.0–46.0)
Hemoglobin: 12.4 g/dL (ref 12.0–15.0)
MCH: 30.1 pg (ref 26.0–34.0)
MCHC: 34.2 g/dL (ref 30.0–36.0)
MCV: 88.1 fL (ref 78.0–100.0)
Platelets: 174 10*3/uL (ref 150–400)
RBC: 4.12 MIL/uL (ref 3.87–5.11)
RDW: 12.6 % (ref 11.5–15.5)
WBC: 12.7 10*3/uL — ABNORMAL HIGH (ref 4.0–10.5)

## 2017-02-03 LAB — GLUCOSE, CAPILLARY
GLUCOSE-CAPILLARY: 273 mg/dL — AB (ref 65–99)
GLUCOSE-CAPILLARY: 188 mg/dL — AB (ref 65–99)
Glucose-Capillary: 218 mg/dL — ABNORMAL HIGH (ref 65–99)
Glucose-Capillary: 226 mg/dL — ABNORMAL HIGH (ref 65–99)
Glucose-Capillary: 372 mg/dL — ABNORMAL HIGH (ref 65–99)

## 2017-02-03 LAB — VANCOMYCIN, RANDOM: VANCOMYCIN RM: 26

## 2017-02-03 LAB — HEMOGLOBIN A1C
HEMOGLOBIN A1C: 11.8 % — AB (ref 4.8–5.6)
Mean Plasma Glucose: 292 mg/dL

## 2017-02-03 LAB — MRSA PCR SCREENING: MRSA BY PCR: NEGATIVE

## 2017-02-03 LAB — POCT ACTIVATED CLOTTING TIME
ACTIVATED CLOTTING TIME: 213 s
ACTIVATED CLOTTING TIME: 158 s
Activated Clotting Time: 252 seconds

## 2017-02-03 LAB — CREATININE, SERUM
CREATININE: 2.29 mg/dL — AB (ref 0.44–1.00)
GFR calc Af Amer: 30 mL/min — ABNORMAL LOW (ref >60)
GFR, EST NON AFRICAN AMERICAN: 26 mL/min — AB (ref >60)

## 2017-02-03 SURGERY — ABDOMINAL AORTOGRAM W/LOWER EXTREMITY
Anesthesia: LOCAL

## 2017-02-03 MED ORDER — LIDOCAINE HCL 1 % IJ SOLN
INTRAMUSCULAR | Status: AC
  Filled 2017-02-03: qty 20

## 2017-02-03 MED ORDER — INSULIN GLARGINE 100 UNIT/ML ~~LOC~~ SOLN
72.0000 [IU] | Freq: Two times a day (BID) | SUBCUTANEOUS | Status: DC
  Administered 2017-02-03: 72 [IU] via SUBCUTANEOUS
  Filled 2017-02-03 (×3): qty 0.72

## 2017-02-03 MED ORDER — HEPARIN SODIUM (PORCINE) 1000 UNIT/ML IJ SOLN
INTRAMUSCULAR | Status: DC | PRN
  Administered 2017-02-03: 2000 [IU] via INTRAVENOUS
  Administered 2017-02-03: 6000 [IU] via INTRAVENOUS

## 2017-02-03 MED ORDER — CLOPIDOGREL BISULFATE 75 MG PO TABS
300.0000 mg | ORAL_TABLET | Freq: Once | ORAL | Status: AC
  Administered 2017-02-03: 300 mg via ORAL
  Filled 2017-02-03: qty 4

## 2017-02-03 MED ORDER — IODIXANOL 320 MG/ML IV SOLN
INTRAVENOUS | Status: DC | PRN
  Administered 2017-02-03: 150 mL via INTRA_ARTERIAL

## 2017-02-03 MED ORDER — SODIUM CHLORIDE 0.9 % IV SOLN
INTRAVENOUS | Status: DC | PRN
  Administered 2017-02-03: 10:00:00 via SURGICAL_CAVITY

## 2017-02-03 MED ORDER — OXYCODONE HCL 5 MG PO TABS
10.0000 mg | ORAL_TABLET | ORAL | Status: DC | PRN
  Administered 2017-02-03 – 2017-02-11 (×24): 10 mg via ORAL
  Filled 2017-02-03 (×27): qty 2

## 2017-02-03 MED ORDER — HEPARIN SODIUM (PORCINE) 5000 UNIT/ML IJ SOLN
5000.0000 [IU] | Freq: Three times a day (TID) | INTRAMUSCULAR | Status: DC
  Administered 2017-02-03 – 2017-02-11 (×23): 5000 [IU] via SUBCUTANEOUS
  Filled 2017-02-03 (×24): qty 1

## 2017-02-03 MED ORDER — ONDANSETRON HCL 4 MG/2ML IJ SOLN: 4.0000 mg | Freq: Four times a day (QID) | INTRAMUSCULAR | Status: DC | PRN

## 2017-02-03 MED ORDER — OXYCODONE HCL ER 10 MG PO T12A
10.0000 mg | EXTENDED_RELEASE_TABLET | Freq: Two times a day (BID) | ORAL | Status: DC
  Administered 2017-02-03: 10 mg via ORAL
  Filled 2017-02-03 (×2): qty 1

## 2017-02-03 MED ORDER — LIDOCAINE HCL (PF) 1 % IJ SOLN
INTRAMUSCULAR | Status: DC | PRN
  Administered 2017-02-03: 15 mL

## 2017-02-03 MED ORDER — SODIUM CHLORIDE 0.9 % IV SOLN
INTRAVENOUS | Status: DC
  Administered 2017-02-03 – 2017-02-04 (×2): via INTRAVENOUS

## 2017-02-03 MED ORDER — LIVING WELL WITH DIABETES BOOK
Freq: Once | Status: DC
  Filled 2017-02-03: qty 1

## 2017-02-03 MED ORDER — HYDRALAZINE HCL 20 MG/ML IJ SOLN
INTRAMUSCULAR | Status: AC
  Filled 2017-02-03: qty 1

## 2017-02-03 MED ORDER — HEPARIN SODIUM (PORCINE) 1000 UNIT/ML IJ SOLN
INTRAMUSCULAR | Status: AC
  Filled 2017-02-03: qty 1

## 2017-02-03 MED ORDER — CLOPIDOGREL BISULFATE 75 MG PO TABS
75.0000 mg | ORAL_TABLET | Freq: Every day | ORAL | Status: DC
  Administered 2017-02-04 – 2017-02-11 (×8): 75 mg via ORAL
  Filled 2017-02-03 (×9): qty 1

## 2017-02-03 MED ORDER — HEPARIN (PORCINE) IN NACL 2-0.9 UNIT/ML-% IJ SOLN
INTRAMUSCULAR | Status: DC | PRN
  Administered 2017-02-03: 1000 mL

## 2017-02-03 SURGICAL SUPPLY — 26 items
BAG SNAP BAND KOVER 36X36 (MISCELLANEOUS) ×3 IMPLANT
BALLN ARMADA 3.0X80X150 (BALLOONS) ×3
BALLN ARMADA 4.0X120X150 (BALLOONS) ×3
BALLN IN.PACT DCB 4X80 (BALLOONS) ×3
BALLN STERLING OTW 3X40X150 (BALLOONS) ×3
BALLOON ARMADA 3.0X80X150 (BALLOONS) ×2 IMPLANT
BALLOON ARMADA 4.0X120X150 (BALLOONS) ×2 IMPLANT
BALLOON STERLING OTW 3X40X150 (BALLOONS) ×2 IMPLANT
BUR JETSTREAM XC 2.1/3.0 (BURR) ×2 IMPLANT
BURR JETSTREAM XC 2.1/3.0 (BURR) ×3
CATH OMNI FLUSH 5F 65CM (CATHETERS) ×3 IMPLANT
CATH QUICKCROSS .018X135CM (MICROCATHETER) ×3 IMPLANT
COVER PRB 48X5XTLSCP FOLD TPE (BAG) ×2 IMPLANT
COVER PROBE 5X48 (BAG) ×2
DCB IN.PACT 4X80 (BALLOONS) ×2 IMPLANT
KIT ENCORE 26 ADVANTAGE (KITS) ×3 IMPLANT
KIT MICROINTRODUCER STIFF 5F (SHEATH) ×3 IMPLANT
KIT PV (KITS) ×3 IMPLANT
LUBRICANT VIPERSLIDE CORONARY (MISCELLANEOUS) ×3 IMPLANT
SHEATH FLEXOR ANSEL 1 7F 45CM (SHEATH) ×3 IMPLANT
SHEATH PINNACLE 5F 10CM (SHEATH) ×3 IMPLANT
SYR MEDRAD MARK V 150ML (SYRINGE) ×3 IMPLANT
TRANSDUCER W/STOPCOCK (MISCELLANEOUS) ×3 IMPLANT
TRAY PV CATH (CUSTOM PROCEDURE TRAY) ×3 IMPLANT
WIRE BENTSON .035X145CM (WIRE) ×3 IMPLANT
WIRE SPARTACORE .014X190CM (WIRE) ×3 IMPLANT

## 2017-02-03 NOTE — Progress Notes (Signed)
PT Cancellation Note  Patient Details Name: Kathy Phelps MRN: [id number] DOB: December 24, 1980   Cancelled Treatment:    Reason Eval/Treat Not Completed: Medical issues which prohibited therapy; noted pt on bedrest for 4 hours (sheath removed around 1pm).  Will attempt to see tomorrow.   Reginia Naas 02/03/2017, 2:20 PM  Magda Kiel, [place] 02/03/2017

## 2017-02-03 NOTE — Progress Notes (Addendum)
Pharmacy Antibiotic Note  Kathy Phelps is a 36 y.o. female admitted on 02/01/2017 with left foot pain. Patient was found to have critical LLE ischemia and underwent atherectomy and angioplasty of left popliteal artery.  Pharmacy has been consulted for vancomycin and Zosyn dosing for cellulitis.  Patient has AKI (SCr 0.87 >> 3.10).  She has received 3 doses of vancomycin not including this AM's dose (this morning's dose was not charted as given because patient was off the floor).  A random vancomycin level obtained 24 hours after the last dose is elevated at 26 mcg/mL.     Plan: - Hold vancomycin - Continue Zosyn 3.375gm IV Q8H, 4 hr infusion.  Adjust when CrCL < 20 ml/min. - F/U AM labs to decide when to recheck vancomycin level - Consider reducing gabapentin to BID given AKI   Height: 5\' 9"  (175.3 cm) Weight: 135 lb (61.2 kg) IBW/kg (Calculated) : 66.2  Temp (24hrs), Avg:99.7 F (37.6 C), Min:98.4 F (36.9 C), Max:101.5 F (38.6 C)   Recent Labs Lab 02/01/17 2343 02/02/17 0615 02/02/17 1404 02/02/17 1552 02/03/17 1423 02/03/17 2116  WBC 10.7* 9.5  --   --  12.7*  --   CREATININE 0.92 0.87  --   --  2.29* 3.10*  LATICACIDVEN  --   --  1.5 1.2  --   --   VANCORANDOM  --   --   --   --   --  26    Estimated Creatinine Clearance: 24 mL/min (A) (by C-G formula based on SCr of 3.1 mg/dL (H)).    Allergies  Allergen Reactions  . Hydrocodone-Acetaminophen Hives  . Tylenol [Acetaminophen] Itching and Swelling     Antimicrobials this admission:  4/25 vanc >>  4/25 zosyn >>   Dose adjustments this admission:  4/26 VR = 24 mcg/mL (1g q12, but missed 1000 dose today) >> hold (SCr 0.87 > 3.1)  Microbiology results:  4/26 MRSA PCR: neg    Kathy Phelps, PharmD, BCPS Pager:  [phone] 02/03/2017, 10:21 PM

## 2017-02-03 NOTE — Progress Notes (Signed)
Pt arrived to 2W33, pt family at bedside. VSS. Is asleep but easily arousable, no pain medication requested at this time. Can doppler L pedal pulse. Pt aware of aortogram in morning, NPO at midnight. Oriented to room and call light. Will continue to monitor.  Jaymes Graff, RN

## 2017-02-03 NOTE — Progress Notes (Signed)
Site area: Right groin a 7 french long arterial sheath was removed  Site Prior to Removal:  Level 0  Pressure Applied For 30 MINUTES    Bedrest Beginning at 1225p  Manual:   Yes.    Patient Status During Pull:  stable  Post Pull Groin Site:  Level 0  Post Pull Instructions Given:  Yes.    Post Pull Pulses Present:  Yes.    Dressing Applied:  Yes.    Comments:

## 2017-02-03 NOTE — Op Note (Signed)
Patient name: Kathy Phelps MRN: [id number] DOB: 21-Jun-1981 Sex: female  02/03/2017 Pre-operative Diagnosis: critical left lower extremity ischemia Post-operative diagnosis:  Same Surgeon:  Erlene Quan C. Donzetta Matters, MD Procedure Performed: 1.  US guided cannulation of right common femoral artery 2.  Aortogram with bilateral lower extremity runoff 3.  Jetstream atherectomy of left popliteal artery 4.  Drug coated balloon angioplasty of left popliteal artery 5.  Plan balloon angioplasty of left anterior tibial artery  Indications:  A 36 year old female with long history of smoking and diabetes as critical limb ischemia with dry gangrene of left toes. She is indicated for the above procedure.  Findings: Aorta and iliac segments are free of disease. On the affected left side the SFA has one area of nonflow limiting stenosis. At the level of the popliteal the artery has at least 90% stenosis with possible occlusion. There is a high takeoff of the anterior tibial artery. There is three-vessel runoff to the ankle without disease in the tibials. Following intervention there is less than 20% residual stenosis of the popliteal artery with no flow limiting dissection and remains three-vessel runoff to the ankle. On the right side there is high take off the posterior tibial artery. This vessel has greater than 70% stenosis at its ostium. There are several other areas of nonflow limiting stenosis in the SFA and popliteal artery that do not require intervention at this time. At completion of all our interventions there were strong signals at the left anterior tibial, posterior tibial, peroneal arteries at the ankle as well as a signal near the left great toe.   Procedure:  The patient was identified in the holding area and taken to room 8.  The patient was then placed supine on the table and prepped and draped in the usual sterile fashion.  A time out was called.  Ultrasound was used to evaluate the right common  femoral artery.  It was patent .  A digital ultrasound image was acquired.  A micropuncture needle was used to access the right common femoral artery under ultrasound guidance.  An 018 wire was advanced without resistance and a micropuncture sheath was placed.  The 018 wire was removed and a benson wire was placed.  The micropuncture sheath was exchanged for a 5 french sheath.  An omniflush catheter was advanced over the wire to the level of L-1.  An abdominal angiogram was obtained followed by bilateral lower extremity runoff with the above findings. We then went up and over the bifurcation using the Omni catheter Bentson wire and exchanged for long 7 French sheath. Patient this time was heparinized with 6000 units of heparin and received an additional dose of 2000 for a CT of 213. We then used command 18 wire and quick cross catheter to get through very tight stenosis or occlusion of the popliteal artery into the TP trunk and confirmed intraluminal access. Our 018 catheter was noted to be occlusive to the stenosis. We then exchanged for a versa core wire and perform jetstream atherectomy of our popliteal artery. Following this we predilated with 3 mm balloon followed by drug coated balloon angioplasty with a 4 mm balloon. Angiogram at this time demonstrated residual dissection and so we inflated a long 4 mm balloon to re-tack this. On recheck angiogram we now had lost R anterior tibial artery takeoff. We were able to wire this with the command 18 wire and then performed balloon angioplasty of the ostium with short 3 mm balloon at low  pressure. Completion angiogram now demonstrated our continued disease in the popliteal artery although nothing was flow limiting and we had three-vessel runoff to the level of the foot. Satisfied with this we retracted our sheath into the right external iliac artery removed our catheters and wires and the sheath will be pulled and postoperative holding area. Patient tolerated  procedure without immediate, location.   Contrast: 150cc  Taunya Goral C. Donzetta Matters, MD [facility] Office: [phone] Pager: [phone]

## 2017-02-03 NOTE — Progress Notes (Signed)
PROGRESS NOTE    Kathy Phelps  FWY:[id number] DOB: 09-08-1981 DOA: 02/01/2017 PCP: Rubbie Battiest, NP     Brief Narrative:  36 yo female who presented to the hospital with the chief complain of left foot pain. Worsening symptoms for the last 4 weeks, refractive to outpatient oral antibiotic therapy. Initial evaluation patient found tachycardic. Left foot with significant erythema. Admitted for cellulitis to rule out deep infection.    Assessment & Plan:   Principal Problem:   Cellulitis of left foot Active Problems:   Uncontrolled type 2 diabetes mellitus with hyperglycemia (HCC)   HLD (hyperlipidemia)   Tobacco abuse   Essential hypertension   Malnutrition of moderate degree   1. Left foot cellulitis with possible gangrene.  Will continue antibiotic therapy with vancomycin and zosyn IV, will follow on cell count, cultures and temperature curve. Noted cyanotic toes on the left, palpable pulses. Left ABI indicated moderate peripheral arterial disease, patient to undergo aortogram by vascular 4/26. Pain control with ketorolac and metformin. Gentle hydration with saline. Echo pending, doubt embolic phenomena  2. T2DM. Uncontrolled-Will continue glucose cover and monitoring, increase Lantus to 70 twice a day  , hemoglobin A1c  11.8  . Gabapentin for pain control.   3. HTN. Continue blood pressure control with metoprolol.   4. Tobacco abuse, Will add nicotine inhaler  5. Dyslipidemia. Continue statin therapy with simvastatin.   6. Bipolar disorder/depression/schizophrenia. Continue trileptal. latuda and alprazolam.   7. Right submandibular mass.   soft tissue ultrasound showed ill-defined hypoechoic mass in the submandibular region measuring 2.5 x 1.3 x 2.0 cm. CT head and neck pending      DVT prophylaxis: scd  Code Status: full  Family Communication: discussed with daughter and husband in the room Disposition Plan:  Will need SNF  Consultants:    Vascular  Procedures:   Aortogram 4/26   Antimicrobials:   Vancomycin  Zosyn     Subjective: Minimal foot pain, No nausea or vomiting, positive somnolence.   Objective: Vitals:   02/02/17 2047 02/02/17 2325 02/03/17 0630 02/03/17 0853  BP: 119/85 112/80 (!) 142/86   Pulse: 88 77 87   Resp: 16 18 18    Temp: 98.9 F (37.2 C) 98.4 F (36.9 C)    TempSrc: Oral Oral    SpO2: 98% 100% 100% 100%  Weight:      Height:        Intake/Output Summary (Last 24 hours) at 02/03/17 0958 Last data filed at 02/03/17 0300  Gross per 24 hour  Intake          1499.17 ml  Output                0 ml  Net          1499.17 ml   Filed Weights   02/02/17 0242  Weight: 61.2 kg (135 lb)    Examination:  General exam: deconditioned and ill looking appearing E ENT: mild pallor, no icterus.  Respiratory system: Clear to auscultation. Respiratory effort normal. Cardiovascular system: S1 & S2 heard, RRR. No JVD, murmurs, rubs, gallops or clicks. No pedal edema. Gastrointestinal system: Abdomen is nondistended, soft and nontender. No organomegaly or masses felt. Normal bowel sounds heard. Central nervous system: Alert and oriented. No focal neurological deficits. Extremities: Symmetric 5 x 5 power. Skin: Left foot with 3th and 4th toe discoloration, cyanotic with decrease local temperature, pulses are palpable. Noted for foot erythema, expanding to the leg, with ill defined margins.  Marland Kitchen  Data Reviewed: I have personally reviewed following labs and imaging studies  CBC:  Recent Labs Lab 02/01/17 2343 02/02/17 0615  WBC 10.7* 9.5  NEUTROABS 5.7 4.5  HGB 12.5 12.0  HCT 35.9* 34.7*  MCV 85.9 86.1  PLT 236 169   Basic Metabolic Panel:  Recent Labs Lab 02/01/17 2343 02/02/17 0615  NA 134* 135  K 3.5 3.6  CL 100* 99*  CO2 28 28  GLUCOSE 319* 302*  BUN 10 11  CREATININE 0.92 0.87  CALCIUM 8.4* 8.2*   GFR: Estimated Creatinine Clearance: 85.5 mL/min (by C-G formula  based on SCr of 0.87 mg/dL). Liver Function Tests:  Recent Labs Lab 02/02/17 0615  AST 40  ALT 10*  ALKPHOS 63  BILITOT 0.3  PROT 6.1*  ALBUMIN 2.6*   No results for input(s): LIPASE, AMYLASE in the last 168 hours. No results for input(s): AMMONIA in the last 168 hours. Coagulation Profile: No results for input(s): INR, PROTIME in the last 168 hours. Cardiac Enzymes: No results for input(s): CKTOTAL, CKMB, CKMBINDEX, TROPONINI in the last 168 hours. BNP (last 3 results) No results for input(s): PROBNP in the last 8760 hours. HbA1C:  Recent Labs  02/02/17 0615  HGBA1C 11.8*   CBG:  Recent Labs Lab 02/02/17 0821 02/02/17 1202 02/02/17 1755 02/02/17 2117 02/03/17 0638  GLUCAP 329* 304* 317* 190* 273*   Lipid Profile: No results for input(s): CHOL, HDL, LDLCALC, TRIG, CHOLHDL, LDLDIRECT in the last 72 hours. Thyroid Function Tests: No results for input(s): TSH, T4TOTAL, FREET4, T3FREE, THYROIDAB in the last 72 hours. Anemia Panel: No results for input(s): VITAMINB12, FOLATE, FERRITIN, TIBC, IRON, RETICCTPCT in the last 72 hours. Sepsis Labs:  Recent Labs Lab 02/02/17 1404 02/02/17 1552  LATICACIDVEN 1.5 1.2    Recent Results (from the past 240 hour(s))  MRSA PCR Screening     Status: None   Collection Time: 02/03/17 12:25 AM  Result Value Ref Range Status   MRSA by PCR NEGATIVE NEGATIVE Final    Comment:        The GeneXpert MRSA Assay (FDA approved for NASAL specimens only), is one component of a comprehensive MRSA colonization surveillance program. It is not intended to diagnose MRSA infection nor to guide or monitor treatment for MRSA infections.          Radiology Studies: Mr Foot Left Wo Contrast  Result Date: 02/02/2017 CLINICAL DATA:  Increasing pain and discoloration of left foot lateral 3 toes, gangrenous for 2 days. Patient states the symptoms started with lateral most toe a month ago. EXAM: MRI OF THE LEFT FOOT WITHOUT CONTRAST  TECHNIQUE: Multiplanar, multisequence MR imaging of the left forefoot was performed. No intravenous contrast was administered. COMPARISON:  None. FINDINGS: Bones/Joint/Cartilage No fracture or dislocation. Normal alignment. No joint effusion. Mild osteoarthritis of the first MTP joint. Ligaments Ligaments are suboptimally evaluated by CT. Lisfranc ligament intact. Muscles and Tendons Visualized flexor and extensor compartment tendons are intact. Edema in the plantar musculature which may be neurogenic given the patient's history of diabetes versus less likely myositis. Soft tissue No fluid collection or hematoma. No soft tissue mass. Mild soft tissue edema along the dorsal aspect of the forefoot. IMPRESSION: 1. No evidence of osteomyelitis of the left foot. 2. Mild soft tissue edema along the dorsal aspect of the forefoot as can be seen with mild cellulitis. Electronically Signed   By: Kathreen Devoid   On: 02/02/2017 08:52   Dg Foot Complete Left  Result Date: 02/01/2017 CLINICAL  DATA:  LEFT fifth toe pain for 3 weeks, gangrenous for 2 days. EXAM: LEFT FOOT - COMPLETE 3+ VIEW COMPARISON:  LEFT foot radiographs January 10, 2017 FINDINGS: No acute fracture deformity or dislocation. No destructive bony lesions. Soft tissue planes are not suspicious. IMPRESSION: Negative. Electronically Signed   By: Elon Alas M.D.   On: 02/01/2017 22:55   US Soft Tissue Neck  Result Date: 02/02/2017 CLINICAL DATA:  Right submandibular mass EXAM: ULTRASOUND OF HEAD/NECK SOFT TISSUES TECHNIQUE: Ultrasound examination of the head and neck soft tissues was performed in the area of clinical concern. COMPARISON:  None. FINDINGS: Imaging in the region of the palpable abnormality corresponds to an ill-defined hypoechoic mass in the submandibular region measuring 2.5 x 1.3 x 2.0 cm. Color Doppler imaging demonstrates moderate internal vascularity. IMPRESSION: The palpable abnormality corresponds to an ill-defined hypoechoic mass with  vascularity. Malignancy is not excluded. CT neck with contrast is recommended. Electronically Signed   By: Marybelle Killings M.D.   On: 02/02/2017 15:28        Scheduled Meds: . [MAR Hold] alprazolam  2 mg Oral BID  . [MAR Hold] aspirin EC  81 mg Oral Daily  . [MAR Hold] feeding supplement (ENSURE ENLIVE)  237 mL Oral BID BM  . [MAR Hold] gabapentin  800 mg Oral QID  . [MAR Hold] insulin aspart  0-9 Units Subcutaneous TID WC  . [MAR Hold] insulin glargine  60 Units Subcutaneous BID  . [MAR Hold] lurasidone  120 mg Oral QHS  . [MAR Hold] metoprolol tartrate  25 mg Oral BID  . [MAR Hold] mometasone-formoterol  2 puff Inhalation BID  . [MAR Hold] multivitamin with minerals  1 tablet Oral Daily  . [MAR Hold] nicotine  21 mg Transdermal Daily  . [MAR Hold] oxcarbazepine  600 mg Oral BID  . [MAR Hold] protein supplement shake  11 oz Oral BID BM  . [MAR Hold] simvastatin  40 mg Oral Daily   Continuous Infusions: . sodium chloride 100 mL/hr at 02/03/17 0652  . heparin    . [MAR Hold] piperacillin-tazobactam (ZOSYN)  IV 3.375 g (02/03/17 0616)  . [MAR Hold] vancomycin Stopped (02/02/17 2355)     LOS: 1 day      Reyne Dumas, MD [facility] Pager [phone]  If 7PM-7AM, please contact night-coverage [email or web address] Password [email or web address] 02/03/2017, 9:58 AM

## 2017-02-03 NOTE — Progress Notes (Signed)
  Progress Note    02/03/2017 7:26 AM * No surgery date entered *  Subjective:  Remains somnolent  Vitals:   02/02/17 2325 02/03/17 0630  BP:  (!) 142/86  Pulse:  87  Resp:  18  Temp: 98.4 F (36.9 C)     Physical Exam: arousable Non labored respirations Left foot has ischemic changes  CBC    Component Value Date/Time   WBC 9.5 02/02/2017 0615   RBC 4.03 02/02/2017 0615   HGB 12.0 02/02/2017 0615   HCT 34.7 (L) 02/02/2017 0615   PLT 209 02/02/2017 0615   MCV 86.1 02/02/2017 0615   MCH 29.8 02/02/2017 0615   MCHC 34.6 02/02/2017 0615   RDW 12.4 02/02/2017 0615   LYMPHSABS 4.0 02/02/2017 0615   MONOABS 0.6 02/02/2017 0615   EOSABS 0.3 02/02/2017 0615   BASOSABS 0.0 02/02/2017 0615    BMET    Component Value Date/Time   NA 135 02/02/2017 0615   K 3.6 02/02/2017 0615   CL 99 (L) 02/02/2017 0615   CO2 28 02/02/2017 0615   GLUCOSE 302 (H) 02/02/2017 0615   BUN 11 02/02/2017 0615   CREATININE 0.87 02/02/2017 0615   CALCIUM 8.2 (L) 02/02/2017 0615   GFRNONAA >60 02/02/2017 0615   GFRAA >60 02/02/2017 0615    INR    Component Value Date/Time   INR 0.96 02/03/2010 0240     Intake/Output Summary (Last 24 hours) at 02/03/17 0726 Last data filed at 02/03/17 0300  Gross per 24 hour  Intake          1499.17 ml  Output                0 ml  Net          1499.17 ml     Assessment:  36 y.o. female with dm, long time smoker with left foot gangrenous changes  Plan: Aortogram with ble runoff today.    Phillipa Morden C. Donzetta Matters, MD [facility] Office: [phone] Pager: [phone]  02/03/2017 7:26 AM

## 2017-02-04 ENCOUNTER — Inpatient Hospital Stay (HOSPITAL_COMMUNITY): Payer: Medicaid Other

## 2017-02-04 DIAGNOSIS — L03031 Cellulitis of right toe: Secondary | ICD-10-CM

## 2017-02-04 DIAGNOSIS — N179 Acute kidney failure, unspecified: Secondary | ICD-10-CM

## 2017-02-04 DIAGNOSIS — I96 Gangrene, not elsewhere classified: Secondary | ICD-10-CM

## 2017-02-04 DIAGNOSIS — I5030 Unspecified diastolic (congestive) heart failure: Secondary | ICD-10-CM

## 2017-02-04 LAB — CK: Total CK: <5 U/L — ABNORMAL LOW (ref 38–234)

## 2017-02-04 LAB — COMPREHENSIVE METABOLIC PANEL
ALK PHOS: 101 U/L (ref 38–126)
ALT: 13 U/L — AB (ref 14–54)
AST: 42 U/L — AB (ref 15–41)
Albumin: 2 g/dL — ABNORMAL LOW (ref 3.5–5.0)
Anion gap: 8 (ref 5–15)
BILIRUBIN TOTAL: 0.7 mg/dL (ref 0.3–1.2)
BUN: 22 mg/dL — AB (ref 6–20)
CALCIUM: 7.8 mg/dL — AB (ref 8.9–10.3)
CHLORIDE: 102 mmol/L (ref 101–111)
CO2: 22 mmol/L (ref 22–32)
CREATININE: 3.53 mg/dL — AB (ref 0.44–1.00)
GFR, EST AFRICAN AMERICAN: 18 mL/min — AB (ref >60)
GFR, EST NON AFRICAN AMERICAN: 15 mL/min — AB (ref >60)
Glucose, Bld: 158 mg/dL — ABNORMAL HIGH (ref 65–99)
Potassium: 3.6 mmol/L (ref 3.5–5.1)
Sodium: 132 mmol/L — ABNORMAL LOW (ref 135–145)
TOTAL PROTEIN: 5.2 g/dL — AB (ref 6.5–8.1)

## 2017-02-04 LAB — URINALYSIS, ROUTINE W REFLEX MICROSCOPIC
BILIRUBIN URINE: NEGATIVE
Glucose, UA: NEGATIVE mg/dL
Ketones, ur: NEGATIVE mg/dL
Leukocytes, UA: NEGATIVE
NITRITE: NEGATIVE
PH: 5 (ref 5.0–8.0)
Protein, ur: NEGATIVE mg/dL
Specific Gravity, Urine: 1.019 (ref 1.005–1.030)
Squamous Epithelial / LPF: NONE SEEN

## 2017-02-04 LAB — GLUCOSE, CAPILLARY
GLUCOSE-CAPILLARY: 137 mg/dL — AB (ref 65–99)
GLUCOSE-CAPILLARY: 80 mg/dL (ref 65–99)
GLUCOSE-CAPILLARY: 103 mg/dL — AB (ref 65–99)
GLUCOSE-CAPILLARY: 58 mg/dL — AB (ref 65–99)
Glucose-Capillary: 153 mg/dL — ABNORMAL HIGH (ref 65–99)
Glucose-Capillary: 65 mg/dL (ref 65–99)

## 2017-02-04 LAB — CBC
HCT: 31.8 % — ABNORMAL LOW (ref 36.0–46.0)
Hemoglobin: 10.7 g/dL — ABNORMAL LOW (ref 12.0–15.0)
MCH: 29.7 pg (ref 26.0–34.0)
MCHC: 33.6 g/dL (ref 30.0–36.0)
MCV: 88.3 fL (ref 78.0–100.0)
PLATELETS: 186 10*3/uL (ref 150–400)
RBC: 3.6 MIL/uL — AB (ref 3.87–5.11)
RDW: 12.7 % (ref 11.5–15.5)
WBC: 14 10*3/uL — AB (ref 4.0–10.5)

## 2017-02-04 LAB — SODIUM, URINE, RANDOM: Sodium, Ur: 48 mmol/L

## 2017-02-04 LAB — CREATININE, URINE, RANDOM: CREATININE, URINE: 53.12 mg/dL

## 2017-02-04 MED ORDER — METOPROLOL TARTRATE 12.5 MG HALF TABLET
12.5000 mg | ORAL_TABLET | Freq: Two times a day (BID) | ORAL | Status: DC
  Administered 2017-02-04 – 2017-02-11 (×15): 12.5 mg via ORAL
  Filled 2017-02-04 (×14): qty 1

## 2017-02-04 MED ORDER — ALUM & MAG HYDROXIDE-SIMETH 200-200-20 MG/5ML PO SUSP: 30.0000 mL | ORAL | Status: DC | PRN

## 2017-02-04 MED ORDER — ALPRAZOLAM 0.5 MG PO TABS
0.5000 mg | ORAL_TABLET | Freq: Two times a day (BID) | ORAL | Status: DC
  Administered 2017-02-04 (×2): 0.5 mg via ORAL
  Filled 2017-02-04: qty 1

## 2017-02-04 MED ORDER — CALCIUM CARBONATE ANTACID 500 MG PO CHEW
1.0000 | CHEWABLE_TABLET | Freq: Three times a day (TID) | ORAL | Status: DC
  Administered 2017-02-04 – 2017-02-11 (×20): 200 mg via ORAL
  Filled 2017-02-04 (×22): qty 1

## 2017-02-04 MED ORDER — INSULIN GLARGINE 100 UNIT/ML ~~LOC~~ SOLN
70.0000 [IU] | Freq: Two times a day (BID) | SUBCUTANEOUS | Status: DC
  Administered 2017-02-04: 70 [IU] via SUBCUTANEOUS
  Filled 2017-02-04 (×3): qty 0.7

## 2017-02-04 MED ORDER — GABAPENTIN 100 MG PO CAPS
200.0000 mg | ORAL_CAPSULE | Freq: Three times a day (TID) | ORAL | Status: DC
  Administered 2017-02-04 (×3): 200 mg via ORAL
  Filled 2017-02-04 (×3): qty 2

## 2017-02-04 MED ORDER — METHOCARBAMOL 1000 MG/10ML IJ SOLN
500.0000 mg | Freq: Four times a day (QID) | INTRAVENOUS | Status: DC | PRN
  Administered 2017-02-04: 500 mg via INTRAVENOUS
  Filled 2017-02-04 (×3): qty 5

## 2017-02-04 MED ORDER — DEXTROSE 50 % IV SOLN
INTRAVENOUS | Status: AC
  Administered 2017-02-04: 50 mL
  Filled 2017-02-04: qty 50

## 2017-02-04 MED ORDER — OXCARBAZEPINE 300 MG PO TABS
600.0000 mg | ORAL_TABLET | Freq: Two times a day (BID) | ORAL | Status: DC
Start: 2017-02-04 — End: 2017-02-11
  Administered 2017-02-04 – 2017-02-11 (×15): 600 mg via ORAL
  Filled 2017-02-04 (×16): qty 2

## 2017-02-04 MED ORDER — SODIUM CHLORIDE 0.9 % IV BOLUS (SEPSIS)
500.0000 mL | Freq: Once | INTRAVENOUS | Status: AC
  Administered 2017-02-04: 500 mL via INTRAVENOUS

## 2017-02-04 MED FILL — Lidocaine HCl Local Inj 1%: INTRAMUSCULAR | Qty: 20 | Status: AC

## 2017-02-04 NOTE — Evaluation (Signed)
Physical Therapy Evaluation Patient Details Name: Kathy Phelps MRN: [id number] DOB: 21-Sep-1981 Today's Date: 02/04/2017   History of Present Illness  36 y.o. 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 PMH consists of uncontrolled DM, +smoker, and moderate malnutrition. Pt underwent LLE angioplasty 02-03-17.   Clinical Impression  Pt admitted with above diagnosis. Pt currently with functional limitations due to the deficits listed below (see PT Problem List). On eval, pt required min assist bed mobility and mod assist transfers. Mobility significantly limited by pain and lethargy. Unable to progress with gait. Pt trembling throughout the entirety of eval. She reports the trembling started after she awoke from sx. RN reports pt received high dose of pain meds last night. She presented with lethargy and cueing required to stay awake and stay on task.  Pt will benefit from skilled PT to increase their independence and safety with mobility to allow discharge to the venue listed below.       Follow Up Recommendations SNF;Supervision/Assistance - 24 hour    Equipment Recommendations  Rolling walker with 5" wheels;Wheelchair cushion (measurements PT);Wheelchair (measurements PT)    Recommendations for Other Services       Precautions / Restrictions Precautions Precautions: Fall Restrictions Weight Bearing Restrictions: No      Mobility  Bed Mobility Overal bed mobility: Needs Assistance Bed Mobility: Supine to Sit;Sit to Supine     Supine to sit: Min assist Sit to supine: Min assist   General bed mobility comments: verbal cues for sequencing, +rail  Transfers Overall transfer level: Needs assistance   Transfers: Sit to/from Stand;Stand Pivot Transfers Sit to Stand: Mod assist Stand pivot transfers: Mod assist       General transfer comment: RW used for sit to stand bedside. Pt trembling throughout session. Unable to attain full upright stance.  Required return to sitting EOB. Stand pivot transfer performed (without AD) toward right, bed to recliner.  Ambulation/Gait             General Gait Details: unable to progress due to instability with stance  Stairs            Wheelchair Mobility    Modified Rankin (Stroke Patients Only)       Balance Overall balance assessment: Needs assistance Sitting-balance support: Feet supported;No upper extremity supported Sitting balance-Leahy Scale: Fair     Standing balance support: Bilateral upper extremity supported;During functional activity Standing balance-Leahy Scale: Poor                               Pertinent Vitals/Pain Pain Assessment: Faces Faces Pain Scale: Hurts even more Pain Location: L foot during mobility Pain Descriptors / Indicators: Guarding;Grimacing Pain Intervention(s): Premedicated before session;Limited activity within patient's tolerance;Repositioned;Monitored during session    Kathy Phelps expects to be discharged to:: Private residence Living Arrangements: Spouse/significant other;Children Available Help at Discharge: Family;Available 24 hours/day Type of Home: House Home Access: Stairs to enter Entrance Stairs-Rails: Psychiatric nurse of Steps: 3 Home Layout: One level Home Equipment: None      Prior Function Level of Independence: Independent         Comments: Pt reports she has been unable to ambulate for approx 3 weeks PTA due to pain in L foot.     Hand Dominance        Extremity/Trunk Assessment   Upper Extremity Assessment Upper Extremity Assessment: Defer to OT evaluation    Lower  Extremity Assessment Lower Extremity Assessment: Generalized weakness    Cervical / Trunk Assessment Cervical / Trunk Assessment: Normal  Communication   Communication: No difficulties  Cognition Arousal/Alertness: Lethargic;Suspect due to medications Behavior During Therapy:  Anxious;Impulsive Overall Cognitive Status: Impaired/Different from baseline Area of Impairment: Memory;Safety/judgement;Problem solving                     Memory: Decreased recall of precautions   Safety/Judgement: Decreased awareness of safety;Decreased awareness of deficits   Problem Solving: Slow processing;Difficulty sequencing;Requires verbal cues        General Comments      Exercises     Assessment/Plan    PT Assessment Patient needs continued PT services  PT Problem List Decreased strength;Decreased activity tolerance;Decreased balance;Decreased mobility;Decreased cognition;Pain;Decreased knowledge of use of DME;Decreased safety awareness;Decreased knowledge of precautions       PT Treatment Interventions DME instruction;Gait training;Stair training;Functional mobility training;Balance training;Therapeutic exercise;Therapeutic activities;Cognitive remediation;Patient/family education    PT Goals (Current goals can be found in the Care Plan section)  Acute Rehab PT Goals Patient Stated Goal: home PT Goal Formulation: With patient Time For Goal Achievement: 02/18/17 Potential to Achieve Goals: Fair    Frequency Min 3X/week   Barriers to discharge        Co-evaluation               End of Session Equipment Utilized During Treatment: Gait belt Activity Tolerance: Patient limited by pain;Patient limited by lethargy Patient left: in chair;with call bell/phone within reach;with family/visitor present Nurse Communication: Mobility status PT Visit Diagnosis: Unsteadiness on feet (R26.81);Muscle weakness (generalized) (M62.81)    Time: 7989-2119 PT Time Calculation (min) (ACUTE ONLY): 21 min   Charges:   PT Evaluation $PT Eval Moderate Complexity: 1 Procedure     PT G Codes:        Lorrin Goodell, PT  Office # [phone] Pager [phone]   Lorriane Shire 02/04/2017, 10:58 AM

## 2017-02-04 NOTE — Progress Notes (Signed)
Pt assisted to ambulate to bathroom, unable to void. Bladder scan ordered by nephrology showed >700cc of urine. Indwelling foley catheter placed per order for foley if bladder scan >300cc.   Fritz Pickerel, RN

## 2017-02-04 NOTE — Progress Notes (Addendum)
   02/04/17 1300  What Happened  Was fall witnessed? No  Was patient injured? No  Patient found on floor  Found by Staff-comment  Follow Up  MD notified Dr. Allyson Sabal  Time MD notified 1300  Additional tests No  Simple treatment Ice  Progress note created (see row info) Yes  Adult Fall Risk Assessment  Risk Factor Category (scoring not indicated) Fall has occurred during this admission (document High fall risk)  Age 36  Fall History: Fall within 6 months prior to admission 0  Elimination; Bowel and/or Urine Incontinence 0  Elimination; Bowel and/or Urine Urgency/Frequency 0  Medications: includes PCA/Opiates, Anti-convulsants, Anti-hypertensives, Diuretics, Hypnotics, Laxatives, Sedatives, and Psychotropics 5  Patient Care Equipment 1  Mobility-Assistance 2  Mobility-Gait 2  Mobility-Sensory Deficit 0  Altered awareness of immediate physical environment 0  Impulsiveness 2  Lack of understanding of one's physical/cognitive limitations 4  Total Score 16  Patient's Fall Risk High Fall Risk (>13 points)  Adult Fall Risk Interventions  Required Bundle Interventions *See Row Information* High fall risk - low, moderate, and high requirements implemented  Additional Interventions Use of appropriate toileting equipment (bedpan, BSC, etc.)  Screening for Fall Injury Risk  Risk For Fall Injury- See Row Information  None identified  Pain Assessment  Pain Assessment 0-10  Pain Score 4  Pain Type Acute pain  Pain Location Hip  Pain Orientation Right  Pain Descriptors / Indicators Sore  Pain Frequency Constant  Pain Onset With Activity  Pain Intervention(s) Repositioned;Rest;Relaxation  PCA/Epidural/Spinal Assessment  Respiratory Pattern Regular;Unlabored  Neurological  Neuro (WDL) X  Level of Consciousness Alert  Orientation Level Oriented X4  Cognition Appropriate at baseline  Speech Clear  Pupil Assessment  No  Neuro Symptoms Fatigue;Drowsiness;Tremors  Neuro Additional  Assessments No  Musculoskeletal  Musculoskeletal (WDL) X  Assistive Device Front wheel Tayleigh Wetherell  Generalized Weakness Yes   Pt found on floor by visitor walking past her room. 2 NT, OT, and RN assisted pt back to bed. Pt stated she "thought she could do it herself". Post fall huddle complete. Safety zone complete. Pt educated as to need to call for assistance, placed on bed alarm. MD made aware - given verbal order for telesitter.   Fritz Pickerel, RN

## 2017-02-04 NOTE — Consult Note (Signed)
HPI: Kathy Phelps who is a 36 y.o. female with uncontrolled type 2 diabetes, hypertension, bipolar disorder and tobacco use disorder who was admitted to [facility] with left foot cellulitis and gangrenous changes in her left lateral three toes. She was started on Vanco and Zosyn on admission. She was on Toradol IV for pain control on 4/25 and 4/26. Then had peripheral vascular catheterization with IV contrast the morning of 4/26. Serum creatinine drawn the same day in the afternoon was elevated to 2.29 (from 0.87 the previous day). This morning, her serum creatinine trended up to 3.10 for which nephrology was consulted.  Patient also reports history of intermittent urinary retention. She had a PVR of 350 last night. Her urine output over the last 24 was 369 mL with a net positive of 1.8 L.   Past Medical History:  Diagnosis Date  . Bipolar affective ([place])   . Complete miscarriage   . Depression   . Diabetes mellitus   . Hyperlipidemia   . Hypertension   . Pregnancy complication    HELP Syndrome  . Renal disorder   . Schizophrenia [facility])    Past Surgical History:  Procedure Laterality Date  . ABDOMINAL AORTOGRAM W/LOWER EXTREMITY N/A 02/03/2017   Procedure: Abdominal Aortogram w/Lower Extremity;  Surgeon: Waynetta Sandy, MD;  Location: [place] [facility];  Service: Cardiovascular;  Laterality: N/A;  . CESAREAN SECTION    . CHOLECYSTECTOMY    . PERIPHERAL VASCULAR ATHERECTOMY  02/03/2017   Procedure: Peripheral Vascular Atherectomy;  Surgeon: Waynetta Sandy, MD;  Location: [place] [facility];  Service: Cardiovascular;;  . PERIPHERAL VASCULAR BALLOON ANGIOPLASTY  02/03/2017   Procedure: Peripheral Vascular Balloon Angioplasty;  Surgeon: Waynetta Sandy, MD;  Location: [place] [facility];  Service: Cardiovascular;;  . TUBAL LIGATION     Social History:  reports that she has been smoking Cigarettes.  She has a 28.00 pack-year smoking history. She has  never used smokeless tobacco. She reports that she does not drink alcohol or use drugs. Allergies:  Allergies  Allergen Reactions  . Hydrocodone-Acetaminophen Hives  . Tylenol [Acetaminophen] Itching and Swelling   Family History  Problem Relation Age of Onset  . Diabetes Mellitus II Mother     Medications: I have reviewed the patient's current medications.  ROS:  Review of Systems  Constitutional: Negative for fever.  HENT: Negative for sore throat.   Respiratory: Negative for cough and shortness of breath.   Cardiovascular: Positive for leg swelling. Negative for chest pain and palpitations.  Gastrointestinal: Negative for diarrhea and vomiting.  Genitourinary: Negative for frequency and hematuria.       Urinary retention  Musculoskeletal: Positive for back pain.       Left foot pain  Neurological: Positive for tingling. Negative for dizziness.   Blood pressure 121/77, pulse 93, temperature 98.7 F (37.1 C), temperature source Oral, resp. rate 18, height _0  (1.753 m), weight 61.2 kg (135 lb), last menstrual period 01/06/2017, SpO2 100 %.  GEN: appears well, some distress from back pain. Head: normocephalic and atraumatic  Eyes: conjunctiva without injection, sclera anicteric CVS: RRR, nl s1 & s2, no murmurs, no edema RESP: speaks in full sentence, no IWOB,  CTAB GI: BS present & normal, soft, NTND, no guarding, no rebound GU: ?CVA tenderness or muscle spasm MSK: Tenderness to palpation over lumbar paraspinal muscles SKIN: Dark gangrenous toes in the left foot, mildly erythematous skin in her left foot NEURO: alert and oiented appropriately,  no gross defecits  Results for orders placed or performed during the hospital encounter of 02/01/17 (from the past 48 hour(s))  Lactic acid, plasma     Status: None   Collection Time: 02/02/17  3:52 PM  Result Value Ref Range   Lactic Acid, Venous 1.2 0.5 - 1.9 mmol/L  Glucose, capillary     Status: Abnormal   Collection Time:  02/02/17  5:55 PM  Result Value Ref Range   Glucose-Capillary 317 (H) 65 - 99 mg/dL  Glucose, capillary     Status: Abnormal   Collection Time: 02/02/17  9:17 PM  Result Value Ref Range   Glucose-Capillary 190 (H) 65 - 99 mg/dL  MRSA PCR Screening     Status: None   Collection Time: 02/03/17 12:25 AM  Result Value Ref Range   MRSA by PCR NEGATIVE NEGATIVE    Comment:        The GeneXpert MRSA Assay (FDA approved for NASAL specimens only), is one component of a comprehensive MRSA colonization surveillance program. It is not intended to diagnose MRSA infection nor to guide or monitor treatment for MRSA infections.   Glucose, capillary     Status: Abnormal   Collection Time: 02/03/17  6:38 AM  Result Value Ref Range   Glucose-Capillary 273 (H) 65 - 99 mg/dL   Comment 1 Notify RN   POCT Activated clotting time     Status: None   Collection Time: 02/03/17  9:41 AM  Result Value Ref Range   Activated Clotting Time 213 seconds  POCT Activated clotting time     Status: None   Collection Time: 02/03/17 10:10 AM  Result Value Ref Range   Activated Clotting Time 252 seconds  Glucose, capillary     Status: Abnormal   Collection Time: 02/03/17 10:43 AM  Result Value Ref Range   Glucose-Capillary 218 (H) 65 - 99 mg/dL  POCT Activated clotting time     Status: None   Collection Time: 02/03/17 11:49 AM  Result Value Ref Range   Activated Clotting Time 158 seconds  Glucose, capillary     Status: Abnormal   Collection Time: 02/03/17 11:56 AM  Result Value Ref Range   Glucose-Capillary 188 (H) 65 - 99 mg/dL  CBC     Status: Abnormal   Collection Time: 02/03/17  2:23 PM  Result Value Ref Range   WBC 12.7 (H) 4.0 - 10.5 K/uL   RBC 4.12 3.87 - 5.11 MIL/uL   Hemoglobin 12.4 12.0 - 15.0 g/dL   HCT 36.3 36.0 - 46.0 %   MCV 88.1 78.0 - 100.0 fL   MCH 30.1 26.0 - 34.0 pg   MCHC 34.2 30.0 - 36.0 g/dL   RDW 12.6 11.5 - 15.5 %   Platelets 174 150 - 400 K/uL  Creatinine, serum     Status:  Abnormal   Collection Time: 02/03/17  2:23 PM  Result Value Ref Range   Creatinine, Ser 2.29 (H) 0.44 - 1.00 mg/dL   GFR calc non Af Amer 26 (L) >60 mL/min   GFR calc Af Amer 30 (L) >60 mL/min    Comment: (NOTE) The eGFR has been calculated using the CKD EPI equation. This calculation has not been validated in all clinical situations. eGFR's persistently <60 mL/min signify possible Chronic Kidney Disease.   Glucose, capillary     Status: Abnormal   Collection Time: 02/03/17  4:11 PM  Result Value Ref Range   Glucose-Capillary 226 (H) 65 - 99 mg/dL  Comment 1 Notify RN    Comment 2 Document in Chart   Glucose, capillary     Status: Abnormal   Collection Time: 02/03/17  8:56 PM  Result Value Ref Range   Glucose-Capillary 372 (H) 65 - 99 mg/dL   Comment 1 Notify RN   Basic metabolic panel     Status: Abnormal   Collection Time: 02/03/17  9:16 PM  Result Value Ref Range   Sodium 133 (L) 135 - 145 mmol/L   Potassium 4.1 3.5 - 5.1 mmol/L   Chloride 99 (L) 101 - 111 mmol/L   CO2 25 22 - 32 mmol/L   Glucose, Bld 370 (H) 65 - 99 mg/dL   BUN 19 6 - 20 mg/dL   Creatinine, Ser 3.10 (H) 0.44 - 1.00 mg/dL   Calcium 8.4 (L) 8.9 - 10.3 mg/dL   GFR calc non Af Amer 18 (L) >60 mL/min   GFR calc Af Amer 21 (L) >60 mL/min    Comment: (NOTE) The eGFR has been calculated using the CKD EPI equation. This calculation has not been validated in all clinical situations. eGFR's persistently <60 mL/min signify possible Chronic Kidney Disease.    Anion gap 9 5 - 15  Vancomycin, random     Status: None   Collection Time: 02/03/17  9:16 PM  Result Value Ref Range   Vancomycin Rm 26     Comment:        Random Vancomycin therapeutic range is dependent on dosage and time of specimen collection. A peak range is 20.0-40.0 ug/mL A trough range is 5.0-15.0 ug/mL          Comprehensive metabolic panel     Status: Abnormal   Collection Time: 02/04/17  4:29 AM  Result Value Ref Range   Sodium 132  (L) 135 - 145 mmol/L   Potassium 3.6 3.5 - 5.1 mmol/L   Chloride 102 101 - 111 mmol/L   CO2 22 22 - 32 mmol/L   Glucose, Bld 158 (H) 65 - 99 mg/dL   BUN 22 (H) 6 - 20 mg/dL   Creatinine, Ser 3.53 (H) 0.44 - 1.00 mg/dL   Calcium 7.8 (L) 8.9 - 10.3 mg/dL   Total Protein 5.2 (L) 6.5 - 8.1 g/dL   Albumin 2.0 (L) 3.5 - 5.0 g/dL   AST 42 (H) 15 - 41 U/L   ALT 13 (L) 14 - 54 U/L   Alkaline Phosphatase 101 38 - 126 U/L   Total Bilirubin 0.7 0.3 - 1.2 mg/dL   GFR calc non Af Amer 15 (L) >60 mL/min   GFR calc Af Amer 18 (L) >60 mL/min    Comment: (NOTE) The eGFR has been calculated using the CKD EPI equation. This calculation has not been validated in all clinical situations. eGFR's persistently <60 mL/min signify possible Chronic Kidney Disease.    Anion gap 8 5 - 15  CBC     Status: Abnormal   Collection Time: 02/04/17  4:29 AM  Result Value Ref Range   WBC 14.0 (H) 4.0 - 10.5 K/uL   RBC 3.60 (L) 3.87 - 5.11 MIL/uL   Hemoglobin 10.7 (L) 12.0 - 15.0 g/dL   HCT 31.8 (L) 36.0 - 46.0 %   MCV 88.3 78.0 - 100.0 fL   MCH 29.7 26.0 - 34.0 pg   MCHC 33.6 30.0 - 36.0 g/dL   RDW 12.7 11.5 - 15.5 %   Platelets 186 150 - 400 K/uL  Glucose, capillary     Status:  Abnormal   Collection Time: 02/04/17  5:47 AM  Result Value Ref Range   Glucose-Capillary 137 (H) 65 - 99 mg/dL   Comment 1 Notify RN   Glucose, capillary     Status: None   Collection Time: 02/04/17 11:23 AM  Result Value Ref Range   Glucose-Capillary 80 65 - 99 mg/dL   US Soft Tissue Neck  Result Date: 02/02/2017 CLINICAL DATA:  Right submandibular mass EXAM: ULTRASOUND OF HEAD/NECK SOFT TISSUES TECHNIQUE: Ultrasound examination of the head and neck soft tissues was performed in the area of clinical concern. COMPARISON:  None. FINDINGS: Imaging in the region of the palpable abnormality corresponds to an ill-defined hypoechoic mass in the submandibular region measuring 2.5 x 1.3 x 2.0 cm. Color Doppler imaging demonstrates moderate  internal vascularity. IMPRESSION: The palpable abnormality corresponds to an ill-defined hypoechoic mass with vascularity. Malignancy is not excluded. CT neck with contrast is recommended. Electronically Signed   By: Marybelle Killings M.D.   On: 02/02/2017 15:28    Assessment:  AKI: Likely iatrogenic. She was on Toradol for pain control and then had contrast for peripheral vascular catheterization which could have led to acute renal failure. She was also on vancomycin. She also reports about urinary retention likely neurogenic bladder from her poorly controlled diabetes. I also wonder if there is some thromboembolic process from her recent vascular procedure.   Toradol discontinued by primary  Continue IV normal saline infusion. Doesn't appear fluid overloaded.   Renally dosed vancomycin  Renal ultrasound  Urinalysis although this could be low yield at this point  Urine creatinine and urea  Check CK  We will consider Doppler of the renal vessels if no improvements in the next 24  Daily renal function  Avoid nephrotoxic drugs  ? Urinary retention/neurogenic bladder: Likely due to diabetes  Renal ultrasound  Urinalysis as above  Closely monitor urine output  Hyponatremia: Na 132. Due to ARF  Continue IV normal saline  Left foot cellulitis/gangrene/PAD:   Per vascular surgery and primary  Poorly controlled DM-2/diabetic neuropathy  Per primary  Gabapentin renally dosed  Hypertension: Hypertensive overnight  Lopressor per primary   Mercy Riding 02/04/2017, 2:10 PM   I have seen and examined this patient and agree with plan and assessment in the above note with renal recommendations/intervention highlighted.  We were consulted to further evaluate oliguric-ARF following IV Toradol x3 followed by angiogram with 150cc of contrast due to limb-threatening ischemia along with relative hypotension.  Likely multi-factorial with ischemic ATN, contrast nephropathy and possible  papillary necrosis from NSAIDs.  This is further complicated by neurogenic bladder and urinary retention.  Also received 3 grams of vancomycin IV prior to angiogram.  (Her Scr was normal on 02/02/17, underwent angiogram 10am on 02/03/17 and Scr was 2.29 at 2pm that day). Continue with IVF's for now, will order some urine studies, renal US, and avoid any further nephrotoxic agents.  Will recheck bladder scan and if >300 will place foley catheter.  No emergent indication for dialysis at this time, however if her renal function continues to worsen and/or volume or electrolytes become an issue, we may need to proceed with urgent HD.  Will hold off on renal artery duplex as it does not appear that the catheter was above the renal arteries.  Will continue to follow.  Governor Rooks Raiven Belizaire,MD 02/04/2017 3:55 PM

## 2017-02-04 NOTE — Plan of Care (Signed)
Problem: Fluid Volume: Goal: Ability to maintain a balanced intake and output will improve Outcome: Not Progressing Patient reports that she has not been eating much food, has no appetite; reports this had been an ongoing issue  Problem: Activity: Goal: Risk for activity intolerance will decrease Outcome: Not Progressing Pain in L foot limiting mobility

## 2017-02-04 NOTE — Progress Notes (Addendum)
PROGRESS NOTE    Kathy Phelps  VPX:[id number] DOB: 28-Jan-1981 DOA: 02/01/2017 PCP: Rubbie Battiest, NP     Brief Narrative:  36 yo female who presented to the hospital with the chief complain of left foot pain. Worsening symptoms for the last 4 weeks, refractive to outpatient oral antibiotic therapy. Initial evaluation patient found tachycardic. Left foot with significant erythema. Admitted for cellulitis and critical left lower extremity ischemia   Assessment & Plan:   Principal Problem:   Cellulitis of left foot Active Problems:   Uncontrolled type 2 diabetes mellitus with hyperglycemia (HCC)   HLD (hyperlipidemia)   Tobacco abuse   Essential hypertension   Malnutrition of moderate degree  Acute kidney injury, creatinine went from 0.87>3.53 Contrast nephropathy vs vancomycin toxicity vs dehydration secondary to glycosuira vs NSAID induced Blood pressure soft postprocedure Will hydrate with IV fluids and follow renal function Appreciate nephrology input, consult requested    Left foot cellulitis with possible gangrene.  Hold vancomycin given acute kidney injury and continue zosyn IV, will follow on cell count, cultures and temperature curve.   Left ABI indicated moderate peripheral arterial disease, status post aortogram/balloon angioplasty by vascular 4/26. .  . Echo pending, doubt embolic phenomena  I6EV. Uncontrolled-Will continue glucose cover and monitoring, continue Lantus to 70 twice a day  , hemoglobin A1c  11.8  . Gabapentin for pain control. Reduce dose due to AKI  HTN. Continue blood pressure control with metoprolol with hold parameters.   Tobacco abuse,on  nicotine patch  Dyslipidemia. Continue statin therapy with simvastatin.   Bipolar disorder/depression/schizophrenia. Continue trileptal. latuda and alprazolam. Adjust dose for renal function   Right submandibular mass.   soft tissue ultrasound showed ill-defined hypoechoic mass in the submandibular  region measuring 2.5 x 1.3 x 2.0 cm. CT head and neck pending      DVT prophylaxis: scd  Code Status: full  Family Communication: discussed with daughter and husband in the room Disposition Plan:  Will need SNF  Consultants:   Vascular  Nephrology  Procedures:   Aortogram 4/26   Antimicrobials:   Vancomycin  Zosyn     Subjective:  Blood pressure soft, low-grade fever yesterday evening, hypotensive  Objective: Vitals:   02/03/17 1248 02/03/17 1451 02/03/17 2035 02/04/17 0550  BP: (!) 135/101  (!) 147/85 (!) 95/57  Pulse: (!) 106  (!) 103 87  Resp:   18 18  Temp: (!) 101.5 F (38.6 C) 99.8 F (37.7 C) 99.2 F (37.3 C) 98.7 F (37.1 C)  TempSrc: Oral Oral Oral Oral  SpO2: 100%  99% 97%  Weight:      Height:        Intake/Output Summary (Last 24 hours) at 02/04/17 0948 Last data filed at 02/04/17 0300  Gross per 24 hour  Intake          2163.33 ml  Output              369 ml  Net          1794.33 ml   Filed Weights   02/02/17 0242  Weight: 61.2 kg (135 lb)    Examination:  General exam: deconditioned and ill looking appearing E ENT: mild pallor, no icterus.  Respiratory system: Clear to auscultation. Respiratory effort normal. Cardiovascular system: S1 & S2 heard, RRR. No JVD, murmurs, rubs, gallops or clicks. No pedal edema. Gastrointestinal system: Abdomen is nondistended, soft and nontender. No organomegaly or masses felt. Normal bowel sounds heard. Central nervous system: Alert  and oriented. No focal neurological deficits. Extremities: Symmetric 5 x 5 power. Skin: Left foot with 3th and 4th toe discoloration, cyanotic with decrease local temperature, pulses are palpable. Noted for foot erythema, expanding to the leg, with ill defined margins.  .     Data Reviewed: I have personally reviewed following labs and imaging studies  CBC:  Recent Labs Lab 02/01/17 2343 02/02/17 0615 02/03/17 1423 02/04/17 0429  WBC 10.7* 9.5 12.7* 14.0*    NEUTROABS 5.7 4.5  --   --   HGB 12.5 12.0 12.4 10.7*  HCT 35.9* 34.7* 36.3 31.8*  MCV 85.9 86.1 88.1 88.3  PLT 236 209 174 974   Basic Metabolic Panel:  Recent Labs Lab 02/01/17 2343 02/02/17 0615 02/03/17 1423 02/03/17 2116 02/04/17 0429  NA 134* 135  --  133* 132*  K 3.5 3.6  --  4.1 3.6  CL 100* 99*  --  99* 102  CO2 28 28  --  25 22  GLUCOSE 319* 302*  --  370* 158*  BUN 10 11  --  19 22*  CREATININE 0.92 0.87 2.29* 3.10* 3.53*  CALCIUM 8.4* 8.2*  --  8.4* 7.8*   GFR: Estimated Creatinine Clearance: 21.1 mL/min (A) (by C-G formula based on SCr of 3.53 mg/dL (H)). Liver Function Tests:  Recent Labs Lab 02/02/17 0615 02/04/17 0429  AST 40 42*  ALT 10* 13*  ALKPHOS 63 101  BILITOT 0.3 0.7  PROT 6.1* 5.2*  ALBUMIN 2.6* 2.0*   No results for input(s): LIPASE, AMYLASE in the last 168 hours. No results for input(s): AMMONIA in the last 168 hours. Coagulation Profile: No results for input(s): INR, PROTIME in the last 168 hours. Cardiac Enzymes: No results for input(s): CKTOTAL, CKMB, CKMBINDEX, TROPONINI in the last 168 hours. BNP (last 3 results) No results for input(s): PROBNP in the last 8760 hours. HbA1C:  Recent Labs  02/02/17 0615  HGBA1C 11.8*   CBG:  Recent Labs Lab 02/03/17 1043 02/03/17 1156 02/03/17 1611 02/03/17 2056 02/04/17 0547  GLUCAP 218* 188* 226* 372* 137*   Lipid Profile: No results for input(s): CHOL, HDL, LDLCALC, TRIG, CHOLHDL, LDLDIRECT in the last 72 hours. Thyroid Function Tests: No results for input(s): TSH, T4TOTAL, FREET4, T3FREE, THYROIDAB in the last 72 hours. Anemia Panel: No results for input(s): VITAMINB12, FOLATE, FERRITIN, TIBC, IRON, RETICCTPCT in the last 72 hours. Sepsis Labs:  Recent Labs Lab 02/02/17 1404 02/02/17 1552  LATICACIDVEN 1.5 1.2    Recent Results (from the past 240 hour(s))  MRSA PCR Screening     Status: None   Collection Time: 02/03/17 12:25 AM  Result Value Ref Range Status    MRSA by PCR NEGATIVE NEGATIVE Final    Comment:        The GeneXpert MRSA Assay (FDA approved for NASAL specimens only), is one component of a comprehensive MRSA colonization surveillance program. It is not intended to diagnose MRSA infection nor to guide or monitor treatment for MRSA infections.          Radiology Studies: US Soft Tissue Neck  Result Date: 02/02/2017 CLINICAL DATA:  Right submandibular mass EXAM: ULTRASOUND OF HEAD/NECK SOFT TISSUES TECHNIQUE: Ultrasound examination of the head and neck soft tissues was performed in the area of clinical concern. COMPARISON:  None. FINDINGS: Imaging in the region of the palpable abnormality corresponds to an ill-defined hypoechoic mass in the submandibular region measuring 2.5 x 1.3 x 2.0 cm. Color Doppler imaging demonstrates moderate internal vascularity. IMPRESSION: The  palpable abnormality corresponds to an ill-defined hypoechoic mass with vascularity. Malignancy is not excluded. CT neck with contrast is recommended. Electronically Signed   By: Marybelle Killings M.D.   On: 02/02/2017 15:28        Scheduled Meds: . alprazolam  0.5 mg Oral BID  . aspirin EC  81 mg Oral Daily  . clopidogrel  75 mg Oral Q breakfast  . feeding supplement (ENSURE ENLIVE)  237 mL Oral BID BM  . gabapentin  200 mg Oral TID  . heparin  5,000 Units Subcutaneous Q8H  . insulin aspart  0-9 Units Subcutaneous TID WC  . insulin glargine  70 Units Subcutaneous BID  . living well with diabetes book   Does not apply Once  . lurasidone  120 mg Oral QHS  . metoprolol tartrate  12.5 mg Oral BID  . mometasone-formoterol  2 puff Inhalation BID  . multivitamin with minerals  1 tablet Oral Daily  . nicotine  21 mg Transdermal Daily  . OXcarbazepine  600 mg Oral BID  . protein supplement shake  11 oz Oral BID BM  . simvastatin  40 mg Oral Daily   Continuous Infusions: . sodium chloride 100 mL/hr (02/03/17 1213)  . piperacillin-tazobactam (ZOSYN)  IV Stopped  (02/04/17 0947)  . sodium chloride       LOS: 2 days      Reyne Dumas, MD [facility] Pager [phone]  If 7PM-7AM, please contact night-coverage [email or web address] Password [facility] 02/04/2017, 9:48 AM

## 2017-02-04 NOTE — Progress Notes (Signed)
  Echocardiogram 2D Echocardiogram has been performed.  Ichael Pullara T Colbey Wirtanen 02/04/2017, 9:44 AM

## 2017-02-04 NOTE — Evaluation (Signed)
Occupational Therapy Evaluation Patient Details Name: Kathy Phelps MRN: [id number] DOB: [birth year] Today's Date: 02/04/2017    History of Present Illness 36 y.o. 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 PMH consists of uncontrolled DM, +smoker, and moderate malnutrition. Pt underwent LLE angioplasty 02-03-17.    Clinical Impression   Pt reports her 20 y.o. Daughter has been assisting with ADL PTA and a friend has been occasionally grocery shopping for pt and family and has been providing meals; pt reports she is alone during the day and only lays around of the couch until her daughter gets home. Unsure of accuracy of information provided by pt as it does not match information that she provided during PT eval. Pt found on floor trying to get up without assist; RN tech in room and assisting. Pt required max assist for sit to stand from floor level and mod assist for pivot back to bed. Pt presenting with impaired cognition, pain, generalized weakness, hx of falls, and poor balance impacting her independence and safety with ADL and functional mobility. Recommending SNF for follow up to maximize independence and safety with ADL and functional mobility prior to return home. Pt would benefit from continued skilled OT to address established goals.    Follow Up Recommendations  SNF;Supervision/Assistance - 24 hour    Equipment Recommendations  Other (comment) (TBD at next venue)    Recommendations for Other Services       Precautions / Restrictions Precautions Precautions: Fall Precaution Comments: Pt reports multiple falls within the past few months Restrictions Weight Bearing Restrictions: No      Mobility Bed Mobility Overal bed mobility: Needs Assistance Bed Mobility: Sit to Supine       Sit to supine: Min assist   General bed mobility comments: Cues throughout.  Transfers Overall transfer level: Needs assistance Equipment used: 1 person hand held  assist Transfers: Stand Pivot Transfers;Sit to/from Stand Sit to Stand: Max assist (from floor) Stand pivot transfers: Mod assist       General transfer comment: Cues for safety and hand placement.    Balance Overall balance assessment: Needs assistance Sitting-balance support: Feet supported Sitting balance-Leahy Scale: Fair     Standing balance support: Bilateral upper extremity supported Standing balance-Leahy Scale: Poor                             ADL either performed or assessed with clinical judgement   ADL Overall ADL's : Needs assistance/impaired Eating/Feeding: Set up;Sitting   Grooming: Min guard;Sitting   Upper Body Bathing: Moderate assistance;Sitting   Lower Body Bathing: Maximal assistance;Sit to/from stand   Upper Body Dressing : Minimal assistance;Sitting   Lower Body Dressing: Maximal assistance;Sit to/from stand   Toilet Transfer: Moderate assistance;Stand-pivot Toilet Transfer Details (indicate cue type and reason): Simulated         Functional mobility during ADLs: Moderate assistance (for stand pivot only) General ADL Comments: RN tech calling to assist pt up from floor. Pt attempted to get up unassisted and fell between bed and chair.     Vision         Perception     Praxis      Pertinent Vitals/Pain Pain Assessment: Faces Faces Pain Scale: Hurts whole lot Pain Location: L foot, L hip, "heartburn" Pain Descriptors / Indicators: Aching;Burning Pain Intervention(s): Monitored during session;Patient requesting pain meds-RN notified;Limited activity within patient's tolerance     Hand Dominance  Extremity/Trunk Assessment Upper Extremity Assessment Upper Extremity Assessment: Generalized weakness   Lower Extremity Assessment Lower Extremity Assessment: Defer to PT evaluation   Cervical / Trunk Assessment Cervical / Trunk Assessment: Normal   Communication Communication Communication: No difficulties    Cognition Arousal/Alertness: Lethargic;Suspect due to medications Behavior During Therapy: Restless;Anxious;Impulsive Overall Cognitive Status: No family/caregiver present to determine baseline cognitive functioning Area of Impairment: Orientation;Memory;Safety/judgement;Problem solving                 Orientation Level: Disoriented to;Time   Memory: Decreased short-term memory;Decreased recall of precautions   Safety/Judgement: Decreased awareness of safety;Decreased awareness of deficits   Problem Solving: Slow processing;Difficulty sequencing;Requires verbal cues     General Comments       Exercises     Shoulder Instructions      Home Living Family/patient expects to be discharged to:: Private residence Living Arrangements: Children Available Help at Discharge: Family;Available PRN/intermittently Type of Home: House Home Access: Stairs to enter [employer] of Steps: 3 Entrance Stairs-Rails: Right;Left Home Layout: One level     Bathroom Shower/Tub: Teacher, early years/pre: Standard     Home Equipment: None   Additional Comments: Pt reports during OT eval that she is at home alone during the day and her 39 y.o. daughter assists with ADL; providing different information on OT eval vs. PT eval      Prior Functioning/Environment Level of Independence: Needs assistance  Gait / Transfers Assistance Needed: Pt reports she has not been ambulating since foot has been hurting; pt tells me 9 months. ADL's / Homemaking Assistance Needed: Per pt; 11 y.o. daughter assists with bathing and dressing and transfers to bathroom for toileting. Friend occasionally goes grocery shopping and cooks for pt and family when she can.            OT Problem List: Decreased strength;Decreased activity tolerance;Impaired balance (sitting and/or standing);Decreased cognition;Decreased safety awareness;Decreased knowledge of use of DME or AE;Pain      OT  Treatment/Interventions: Self-care/ADL training;Energy conservation;DME and/or AE instruction;Therapeutic activities;Patient/family education;Balance training;Cognitive remediation/compensation    OT Goals(Current goals can be found in the care plan section) Acute Rehab OT Goals Patient Stated Goal: decrease pain OT Goal Formulation: With patient Time For Goal Achievement: 02/18/17 Potential to Achieve Goals: Good ADL Goals Pt Will Perform Grooming: with set-up;with supervision;sitting Pt Will Perform Upper Body Bathing: with set-up;with supervision;sitting Pt Will Perform Lower Body Bathing: with min guard assist;sit to/from stand Pt Will Transfer to Toilet: with min guard assist;ambulating;bedside commode (over toilet) Pt Will Perform Toileting - Clothing Manipulation and hygiene: with min guard assist;sit to/from stand  OT Frequency: Min 2X/week   Barriers to D/C: Decreased caregiver support          Co-evaluation              End of Session Nurse Communication: Mobility status  Activity Tolerance: Patient limited by pain Patient left: in bed;with call bell/phone within reach;with bed alarm set;with nursing/sitter in room  OT Visit Diagnosis: Unsteadiness on feet (R26.81);Other abnormalities of gait and mobility (R26.89);Repeated falls (R29.6);Muscle weakness (generalized) (M62.81)                Time: 4970-2637 OT Time Calculation (min): 21 min Charges:  OT General Charges $OT Visit: 1 Procedure OT Evaluation $OT Eval Moderate Complexity: 1 Procedure G-Codes:     Nyjai Graff A. Ulice Brilliant, M.S., OTR/L Pager: [place] 02/04/2017, 4:26 PM

## 2017-02-04 NOTE — Progress Notes (Signed)
Inpatient Diabetes Program Recommendations  AACE/ADA: New Consensus Statement on Inpatient Glycemic Control (2015)  Target Ranges:  Prepandial:   less than 140 mg/dL      Peak postprandial:   less than 180 mg/dL (1-2 hours)      Critically ill patients:  140 - 180 mg/dL  Results for Kathy, Phelps (MRN [id number]) as of 02/04/2017 13:20  Ref. Range 02/03/2017 06:38 02/03/2017 10:43 02/03/2017 11:56 02/03/2017 16:11 02/03/2017 20:56 02/04/2017 05:47 02/04/2017 11:23  Glucose-Capillary Latest Ref Range: 65 - 99 mg/dL 273 (H) 218 (H) 188 (H) 226 (H) 372 (H) 137 (H) 80   Results for Kathy, Phelps (MRN [id number]) as of 02/04/2017 13:20  Ref. Range 02/01/2017 23:43 02/02/2017 06:15 02/03/2017 21:16 02/04/2017 04:29  Glucose Latest Ref Range: 65 - 99 mg/dL 319 (H) 302 (H) 370 (H) 158 (H)  Hemoglobin A1C Latest Ref Range: 4.8 - 5.6 %  11.8 (H)     Review of Glycemic Control  Diabetes history: DM Outpatient Diabetes medications: Lantus 60 units BID, Metformin 1500 mg BID, Novloog 10-30 units per correction scale when needed Current orders for Inpatient glycemic control: Lantus 70 units BID, Novolog 0-9 units TID with meals  Inpatient Diabetes Program Recommendations: HgbA1C: A1C 11.8% on 02/02/17 indicating an average glucose of 292 mg/dl over the past 2-3 months. Recommend patient be referred to an Endocrinologist to improve DM control.  NOTE: Spoke with patient about diabetes and home regimen for diabetes control. Patient reports that she is followed by Dr. Eulas Post for diabetes management and currently she takes Lantus 60 units BID, Novolog 10-30 units per correction scale when needed, and Metformin 1500 mg BID as an outpatient for diabetes control.  Patient reports that she is taking DM medications as prescribed and that she sees her PCP once a month.  Patient states that she checks her glucose 5 times per day and that it usually reads "HI" on glucometer. Discussed A1C results (11.8% on 02/02/17) and  explained that her current A1C indicates an average glucose of 292 mg/dl over the past 2-3 months. Discussed glucose and A1C goals. Discussed importance of checking CBGs and maintaining good CBG control to prevent long-term and short-term complications. Explained how hyperglycemia leads to damage within blood vessels which lead to the common complications seen with uncontrolled diabetes. Stressed to the patient the importance of improving glycemic control to prevent further complications from uncontrolled diabetes especially for wound healing. Discussed impact of nutrition, exercise, stress, sickness, and medications on diabetes control. Patient states that she has asked her PCP about referring her to other specialist but her PCP has not done so. Strongly recommend patient be referred to an Endocrinologist to get DM under control.  Patient verbalized understanding of information discussed and she states that she has no further questions at this time related to diabetes.  Thanks, Barnie Alderman, RN, MSN, CDE Diabetes Coordinator Inpatient Diabetes Program [phone] (Team Pager)

## 2017-02-04 NOTE — Progress Notes (Signed)
Notified this pm by pharmacy pt's creatinine 0.87 -> 2.29, stat repeat obtained and pt creatinine now 3.10. Pt reports having not urinated all day, and has tried to urinate but cannot. Does report bladder discomfort. Bladder scan done, >264mL retained. MD notified and in-and-out cath done, returning 350 mL. Repeat bladder scan showed 69mL residual. Peri care performed before and after cath. Pt does not voice any complaints at this time. Will continue to closely monitor patient.

## 2017-02-04 NOTE — Progress Notes (Signed)
  Progress Note    02/04/2017 8:30 AM 1 Day Post-Op  Subjective:  Having right groin pain  Vitals:   02/03/17 2035 02/04/17 0550  BP: (!) 147/85 (!) 95/57  Pulse: (!) 103 87  Resp: 18 18  Temp: 99.2 F (37.3 C) 98.7 F (37.1 C)    Physical Exam: More alert this a.m. Right groin is soft without mass or hematoma Left foot with stable gangrenous changes Palpable left dp/pt  CBC    Component Value Date/Time   WBC 14.0 (H) 02/04/2017 0429   RBC 3.60 (L) 02/04/2017 0429   HGB 10.7 (L) 02/04/2017 0429   HCT 31.8 (L) 02/04/2017 0429   PLT 186 02/04/2017 0429   MCV 88.3 02/04/2017 0429   MCH 29.7 02/04/2017 0429   MCHC 33.6 02/04/2017 0429   RDW 12.7 02/04/2017 0429   LYMPHSABS 4.0 02/02/2017 0615   MONOABS 0.6 02/02/2017 0615   EOSABS 0.3 02/02/2017 0615   BASOSABS 0.0 02/02/2017 0615    BMET    Component Value Date/Time   NA 132 (L) 02/04/2017 0429   K 3.6 02/04/2017 0429   CL 102 02/04/2017 0429   CO2 22 02/04/2017 0429   GLUCOSE 158 (H) 02/04/2017 0429   BUN 22 (H) 02/04/2017 0429   CREATININE 3.53 (H) 02/04/2017 0429   CALCIUM 7.8 (L) 02/04/2017 0429   GFRNONAA 15 (L) 02/04/2017 0429   GFRAA 18 (L) 02/04/2017 0429    INR    Component Value Date/Time   INR 0.96 02/03/2010 0240     Intake/Output Summary (Last 24 hours) at 02/04/17 0830 Last data filed at 02/04/17 0300  Gross per 24 hour  Intake          2163.33 ml  Output              369 ml  Net          1794.33 ml     Assessment:  36 y.o. female is s/p lle revascularization with palpable pulses, unfortunately has aki  Plan: Timing of aki not consistent with contrast nephropathy, however she did receive 150cc of contrast and could contribute to renal recovery in near future.  Dual antiplatelet therapy Will f/u in 2-3 weeks when discharged for wound check and abi with lle duplex Betadine pain to left toes   George Alcantar C. Donzetta Matters, MD [facility] Office:  [phone] Pager: [phone]  02/04/2017 8:30 AM

## 2017-02-04 NOTE — Progress Notes (Addendum)
Pharmacy Antibiotic Note  Kathy Phelps is a 36 y.o. female on day #3 Vancomycin and Zosyn for cellulits.  AKI, Vancomycin on hold.  Received Vanc 1gm IV q12h x 3 doses on 4/25 ~12am, 11am and 9pm. AM vanc dose missed on 4/26 when off the floor for procedure.  Random Vanc level 4/26 at ~9pm = 26 mcg/ml, above goal. 24 hrs after last dose.  Also received Toradol x 3 doses 4/25-26 and 150 ml contrast on 4/26 with procedure. Creatinine 0.97 on 4/25 when antibiotics begun, up to 3.53 today. Renal following. On NS at 100 ml/hr. UOP down.  Plan:  Discussed with Dr. Allyson Sabal.  Hold Vancomycin today.  Random Vancomycin level in am  Target vanc troughs 10-15 mcg/ml.  Will plan to re-dose Vancomycin when level < 10 mcg/ml, unless antibiotic plans change.  Continue Zosyn 3.375 gm IV q8hrs (each over 4 hrs), adjust to q12h if Crcl < 20 ml/min.  Follow renal function, progress.  Height: 5\' 9"  (175.3 cm) Weight: 135 lb (61.2 kg) IBW/kg (Calculated) : 66.2  Temp (24hrs), Avg:99 F (37.2 C), Min:98.7 F (37.1 C), Max:99.2 F (37.3 C)   Recent Labs Lab 02/01/17 2343 02/02/17 0615 02/02/17 1404 02/02/17 1552 02/03/17 1423 02/03/17 2116 02/04/17 0429  WBC 10.7* 9.5  --   --  12.7*  --  14.0*  CREATININE 0.92 0.87  --   --  2.29* 3.10* 3.53*  LATICACIDVEN  --   --  1.5 1.2  --   --   --   VANCORANDOM  --   --   --   --   --  26  --     Estimated Creatinine Clearance: 21.1 mL/min (A) (by C-G formula based on SCr of 3.53 mg/dL (H)).    Allergies  Allergen Reactions  . Hydrocodone-Acetaminophen Hives  . Tylenol [Acetaminophen] Itching and Swelling    Antimicrobials this admission:  Vanc 4/25>> (on hold)  Zosyn 4/24 at 11:47pm >>  Dose adjustments this admission:  4/26 Vanc level 26 mcg/ml 24hrs after prior dose, but was on Vanc 1gm IV q12h   Vanc on hold with AKI  Microbiology results:  4/26 MRSA PCR negative  Thank you for allowing pharmacy to be a part of this patient's  care.  Arty Baumgartner, [place] Pager: [phone] 02/04/2017 4:39 PM

## 2017-02-04 NOTE — Progress Notes (Signed)
Called into pt room after pt had fallen. States she was trying to get back to bed from the chair independently and fell. Was in the chair after a PT session this morning. Pt did not call to ask to be assisted back to bed, verbalized understanding of needing to do this from here on. Pt states she landed on her hip - and complains of soreness. MD Dr. Allyson Sabal paged. No telemetry changes. VSS.   Fritz Pickerel, RN

## 2017-02-05 ENCOUNTER — Inpatient Hospital Stay (HOSPITAL_COMMUNITY): Payer: Medicaid Other

## 2017-02-05 LAB — GLUCOSE, CAPILLARY
GLUCOSE-CAPILLARY: 76 mg/dL (ref 65–99)
Glucose-Capillary: 112 mg/dL — ABNORMAL HIGH (ref 65–99)
Glucose-Capillary: 131 mg/dL — ABNORMAL HIGH (ref 65–99)
Glucose-Capillary: 135 mg/dL — ABNORMAL HIGH (ref 65–99)
Glucose-Capillary: 191 mg/dL — ABNORMAL HIGH (ref 65–99)
Glucose-Capillary: 54 mg/dL — ABNORMAL LOW (ref 65–99)

## 2017-02-05 LAB — CBC
HCT: 31.5 % — ABNORMAL LOW (ref 36.0–46.0)
HEMOGLOBIN: 10.6 g/dL — AB (ref 12.0–15.0)
MCH: 29.9 pg (ref 26.0–34.0)
MCHC: 33.7 g/dL (ref 30.0–36.0)
MCV: 89 fL (ref 78.0–100.0)
Platelets: 198 10*3/uL (ref 150–400)
RBC: 3.54 MIL/uL — AB (ref 3.87–5.11)
RDW: 12.7 % (ref 11.5–15.5)
WBC: 13.4 10*3/uL — ABNORMAL HIGH (ref 4.0–10.5)

## 2017-02-05 LAB — COMPREHENSIVE METABOLIC PANEL
ALT: 14 U/L (ref 14–54)
ANION GAP: 10 (ref 5–15)
AST: 37 U/L (ref 15–41)
Albumin: 2 g/dL — ABNORMAL LOW (ref 3.5–5.0)
Alkaline Phosphatase: 132 U/L — ABNORMAL HIGH (ref 38–126)
BUN: 24 mg/dL — ABNORMAL HIGH (ref 6–20)
CHLORIDE: 102 mmol/L (ref 101–111)
CO2: 25 mmol/L (ref 22–32)
Calcium: 8.5 mg/dL — ABNORMAL LOW (ref 8.9–10.3)
Creatinine, Ser: 4.29 mg/dL — ABNORMAL HIGH (ref 0.44–1.00)
GFR calc non Af Amer: 12 mL/min — ABNORMAL LOW (ref >60)
GFR, EST AFRICAN AMERICAN: 14 mL/min — AB (ref >60)
Glucose, Bld: 72 mg/dL (ref 65–99)
Potassium: 4.1 mmol/L (ref 3.5–5.1)
SODIUM: 137 mmol/L (ref 135–145)
Total Bilirubin: 0.8 mg/dL (ref 0.3–1.2)
Total Protein: 5.7 g/dL — ABNORMAL LOW (ref 6.5–8.1)

## 2017-02-05 LAB — VANCOMYCIN, RANDOM: Vancomycin Rm: 16

## 2017-02-05 MED ORDER — ALPRAZOLAM 0.5 MG PO TABS
1.0000 mg | ORAL_TABLET | Freq: Three times a day (TID) | ORAL | Status: DC | PRN
  Administered 2017-02-05 – 2017-02-10 (×4): 1 mg via ORAL
  Filled 2017-02-05 (×4): qty 2

## 2017-02-05 MED ORDER — NALOXONE HCL 0.4 MG/ML IJ SOLN
0.4000 mg | INTRAMUSCULAR | Status: DC | PRN
  Filled 2017-02-05: qty 1

## 2017-02-05 MED ORDER — DIPHENHYDRAMINE HCL 12.5 MG/5ML PO ELIX: 12.5000 mg | ORAL_SOLUTION | Freq: Four times a day (QID) | ORAL | Status: DC | PRN

## 2017-02-05 MED ORDER — ALPRAZOLAM 0.5 MG PO TABS: 1.0000 mg | ORAL_TABLET | Freq: Three times a day (TID) | ORAL | Status: DC | PRN

## 2017-02-05 MED ORDER — NALOXONE HCL 0.4 MG/ML IJ SOLN
0.4000 mg | INTRAMUSCULAR | Status: DC | PRN
  Administered 2017-02-06: 0.4 mg via INTRAVENOUS

## 2017-02-05 MED ORDER — INSULIN GLARGINE 100 UNIT/ML ~~LOC~~ SOLN
55.0000 [IU] | Freq: Two times a day (BID) | SUBCUTANEOUS | Status: DC
  Administered 2017-02-06: 55 [IU] via SUBCUTANEOUS
  Filled 2017-02-05: qty 0.55

## 2017-02-05 MED ORDER — INSULIN GLARGINE 100 UNIT/ML ~~LOC~~ SOLN
55.0000 [IU] | Freq: Two times a day (BID) | SUBCUTANEOUS | Status: DC
Start: 2017-02-05 — End: 2017-02-05

## 2017-02-05 MED ORDER — ONDANSETRON HCL 4 MG/2ML IJ SOLN: 4.0000 mg | Freq: Four times a day (QID) | INTRAMUSCULAR | Status: DC | PRN

## 2017-02-05 MED ORDER — SODIUM CHLORIDE 0.9% FLUSH: 9.0000 mL | INTRAVENOUS | Status: DC | PRN

## 2017-02-05 MED ORDER — FENTANYL 40 MCG/ML IV SOLN
INTRAVENOUS | Status: DC
  Administered 2017-02-05: 1000 ug via INTRAVENOUS
  Administered 2017-02-05 (×2): 75 ug via INTRAVENOUS
  Administered 2017-02-05: 60 ug via INTRAVENOUS
  Administered 2017-02-06: 90 ug via INTRAVENOUS
  Administered 2017-02-06: 75 ug via INTRAVENOUS
  Administered 2017-02-06: 160 ug via INTRAVENOUS
  Administered 2017-02-06: 105 ug via INTRAVENOUS
  Filled 2017-02-05: qty 25

## 2017-02-05 MED ORDER — DIPHENHYDRAMINE HCL 50 MG/ML IJ SOLN
12.5000 mg | Freq: Four times a day (QID) | INTRAMUSCULAR | Status: DC | PRN
  Administered 2017-02-06: 12.5 mg via INTRAVENOUS
  Filled 2017-02-05: qty 1

## 2017-02-05 MED ORDER — DEXTROSE-NACL 5-0.9 % IV SOLN: INTRAVENOUS | Status: DC

## 2017-02-05 MED ORDER — PIPERACILLIN-TAZOBACTAM IN DEX 2-0.25 GM/50ML IV SOLN
2.2500 g | Freq: Four times a day (QID) | INTRAVENOUS | Status: DC
  Administered 2017-02-05 – 2017-02-08 (×12): 2.25 g via INTRAVENOUS
  Filled 2017-02-05 (×13): qty 50

## 2017-02-05 MED ORDER — LORAZEPAM 2 MG/ML IJ SOLN
1.0000 mg | Freq: Four times a day (QID) | INTRAMUSCULAR | Status: DC | PRN
  Administered 2017-02-05: 1 mg via INTRAVENOUS
  Filled 2017-02-05: qty 1

## 2017-02-05 MED ORDER — DEXTROSE-NACL 5-0.9 % IV SOLN
INTRAVENOUS | Status: AC
  Administered 2017-02-05: 09:00:00 via INTRAVENOUS

## 2017-02-05 MED ORDER — DEXTROSE 50 % IV SOLN
INTRAVENOUS | Status: AC
  Administered 2017-02-05: 50 mL
  Filled 2017-02-05: qty 50

## 2017-02-05 MED ORDER — HYDROMORPHONE 1 MG/ML IV SOLN: INTRAVENOUS | Status: DC

## 2017-02-05 MED ORDER — GABAPENTIN 400 MG PO CAPS
400.0000 mg | ORAL_CAPSULE | Freq: Three times a day (TID) | ORAL | Status: DC
  Administered 2017-02-05 – 2017-02-11 (×20): 400 mg via ORAL
  Filled 2017-02-05 (×20): qty 1

## 2017-02-05 MED ORDER — DIPHENHYDRAMINE HCL 50 MG/ML IJ SOLN
12.5000 mg | Freq: Four times a day (QID) | INTRAMUSCULAR | Status: DC | PRN
Start: 2017-02-05 — End: 2017-02-05

## 2017-02-05 NOTE — Progress Notes (Signed)
This morning while RN in room pt became upset on the phone after calling her cell phone to contact her husband and found out that her husband sold her cell phone.  Caller also informed pt that her husband sold her car too.  Pt reported to RN that husband came to get her cell phone and car yesterday to get her something to eat and never came back.  Pt became very tearful and anxious after talking with the caller and reported that she needed a cigarette or something for anxiety to help her get through this.  RN informed pt about no current anxiety PRN medications but offered to call MD for order. Pt reported that if she did not get something for anxiety, she would leave AMA. MD notified and orders initiated. Environmental changes made to reduce anxiety. Pt call bell within reach and bed in lowest position with bed alarm in use.    Izola Price, RN 02/05/2017

## 2017-02-05 NOTE — Progress Notes (Signed)
Hypoglycemic Event at 0644  CBG: 54  Treatment: cranberry juice and chocolate milk  Symptoms: Asymptomatic  Follow-up CBG Result: 76  Possible Reasons for Event: Poor oral intake, pt reports that she doesn't like the food at the hospital and doesn't eat anything but fruit  Comments/MD notified: Yes, MD orders initiated    Patient given 59mL IV dextrose 50% to prevent further hypoglycemia since pt reports that she might not eat anything else other than fruit today. Pt bed alarm in use, bed in lowest position, call bell within reach. Follow-up CBG will be done by oncoming shift, oncoming RN made aware.   Johnson,[place] A  02/05/2017

## 2017-02-05 NOTE — Progress Notes (Signed)
PROGRESS NOTE    Kathy Phelps  RWE:[id number] DOB: [birth year] DOA: 02/01/2017 PCP: Rubbie Battiest, NP     Brief Narrative:  36 yo female who presented to the hospital with the chief complain of left foot pain. Worsening symptoms for the last 4 weeks, refractive to outpatient oral antibiotic therapy. Initial evaluation patient found tachycardic. Left foot with significant erythema. Admitted for cellulitis and critical left lower extremity ischemia   Assessment & Plan:   Principal Problem:   Cellulitis of left foot Active Problems:   Uncontrolled type 2 diabetes mellitus with hyperglycemia (HCC)   HLD (hyperlipidemia)   Tobacco abuse   Essential hypertension   Malnutrition of moderate degree   AKI (acute kidney injury) ([place])   Acute kidney injury, creatinine went from 0.87>3.53>4.29 Urine output 1800 cc last 24 hrs  multi-factorial with ischemic ATN, contrast nephropathy and possible papillary necrosis from NSAIDs Blood pressure soft postprocedure Will hydrate with IV fluids and follow renal function Appreciate nephrology input, consult requested    Left foot cellulitis with possible gangrene.  Hold vancomycin given acute kidney injury and continue zosyn IV, will follow on cell count, cultures and temperature curve.   Left ABI indicated moderate peripheral arterial disease, status post aortogram/balloon angioplasty by vascular 4/26. .  . Echo pending, doubt embolic phenomena. Started on fentanyl PCA by Dr. Scot Dock for uncontrolled pain  T2DM. Uncontrolled-noted to be hypoglycemic in the setting of recent change in renal function, hold Lantus until tomorrow, transitioned to D5 NS  , hemoglobin A1c  11.8 Dose of gabapentin reduced secondary to acute kidney injury  HTN. Continue blood pressure control with metoprolol with hold parameters.   Tobacco abuse,on  nicotine patch  Dyslipidemia. Continue statin therapy with simvastatin.   Bipolar  disorder/depression/anxiety/schizophrenia. Lot of psychosocial stressors, Continue trileptal. latuda and alprazolam. Adjust dose for renal function   Right submandibular mass.   soft tissue ultrasound showed ill-defined hypoechoic mass in the submandibular region measuring 2.5 x 1.3 x 2.0 cm. CT head and neck pending      DVT prophylaxis: scd  Code Status: full  Family Communication: discussed with daughter and husband in the room Disposition Plan:  Will need SNF, not ready for discharge  Consultants:   Vascular  Nephrology  Procedures:   Aortogram 4/26   Antimicrobials:   Vancomycin  Zosyn     Subjective: Patient started on fentanyl PCA for pain control by vascular surgery today  Objective: Vitals:   02/04/17 0550 02/04/17 1252 02/04/17 2028 02/05/17 0615  BP: (!) 95/57 121/77 119/75 (!) 121/54  Pulse: 87 93 97 99  Resp: 18  20 20   Temp: 98.7 F (37.1 C)  99.2 F (37.3 C) 99.3 F (37.4 C)  TempSrc: Oral  Oral Oral  SpO2: 97% 100% 99% 99%  Weight:      Height:        Intake/Output Summary (Last 24 hours) at 02/05/17 0847 Last data filed at 02/05/17 0656  Gross per 24 hour  Intake             2220 ml  Output             1800 ml  Net              420 ml   Filed Weights   02/02/17 0242  Weight: 61.2 kg (135 lb)    Examination:  General exam: deconditioned and ill looking appearing E ENT: mild pallor, no icterus.  Respiratory system: Clear to auscultation.  Respiratory effort normal. Cardiovascular system: S1 & S2 heard, RRR. No JVD, murmurs, rubs, gallops or clicks. No pedal edema. Gastrointestinal system: Abdomen is nondistended, soft and nontender. No organomegaly or masses felt. Normal bowel sounds heard. Central nervous system: Alert and oriented. No focal neurological deficits. Extremities: Symmetric 5 x 5 power. Skin: Left foot with 3th and 4th toe discoloration, cyanotic with decrease local temperature, pulses are palpable. Noted for foot  erythema, expanding to the leg, with ill defined margins.  .     Data Reviewed: I have personally reviewed following labs and imaging studies  CBC:  Recent Labs Lab 02/01/17 2343 02/02/17 0615 02/03/17 1423 02/04/17 0429 02/05/17 0330  WBC 10.7* 9.5 12.7* 14.0* 13.4*  NEUTROABS 5.7 4.5  --   --   --   HGB 12.5 12.0 12.4 10.7* 10.6*  HCT 35.9* 34.7* 36.3 31.8* 31.5*  MCV 85.9 86.1 88.1 88.3 89.0  PLT 236 209 174 186 631   Basic Metabolic Panel:  Recent Labs Lab 02/01/17 2343 02/02/17 0615 02/03/17 1423 02/03/17 2116 02/04/17 0429 02/05/17 0330  NA 134* 135  --  133* 132* 137  K 3.5 3.6  --  4.1 3.6 4.1  CL 100* 99*  --  99* 102 102  CO2 28 28  --  25 22 25   GLUCOSE 319* 302*  --  370* 158* 72  BUN 10 11  --  19 22* 24*  CREATININE 0.92 0.87 2.29* 3.10* 3.53* 4.29*  CALCIUM 8.4* 8.2*  --  8.4* 7.8* 8.5*   GFR: Estimated Creatinine Clearance: 17.3 mL/min (A) (by C-G formula based on SCr of 4.29 mg/dL (H)). Liver Function Tests:  Recent Labs Lab 02/02/17 0615 02/04/17 0429 02/05/17 0330  AST 40 42* 37  ALT 10* 13* 14  ALKPHOS 63 101 132*  BILITOT 0.3 0.7 0.8  PROT 6.1* 5.2* 5.7*  ALBUMIN 2.6* 2.0* 2.0*   No results for input(s): LIPASE, AMYLASE in the last 168 hours. No results for input(s): AMMONIA in the last 168 hours. Coagulation Profile: No results for input(s): INR, PROTIME in the last 168 hours. Cardiac Enzymes:  Recent Labs Lab 02/04/17 0429  CKTOTAL <5*   BNP (last 3 results) No results for input(s): PROBNP in the last 8760 hours. HbA1C: No results for input(s): HGBA1C in the last 72 hours. CBG:  Recent Labs Lab 02/04/17 2147 02/04/17 2227 02/05/17 0012 02/05/17 0644 02/05/17 0752  GLUCAP 65 153* 112* 54* 76   Lipid Profile: No results for input(s): CHOL, HDL, LDLCALC, TRIG, CHOLHDL, LDLDIRECT in the last 72 hours. Thyroid Function Tests: No results for input(s): TSH, T4TOTAL, FREET4, T3FREE, THYROIDAB in the last 72  hours. Anemia Panel: No results for input(s): VITAMINB12, FOLATE, FERRITIN, TIBC, IRON, RETICCTPCT in the last 72 hours. Sepsis Labs:  Recent Labs Lab 02/02/17 1404 02/02/17 1552  LATICACIDVEN 1.5 1.2    Recent Results (from the past 240 hour(s))  MRSA PCR Screening     Status: None   Collection Time: 02/03/17 12:25 AM  Result Value Ref Range Status   MRSA by PCR NEGATIVE NEGATIVE Final    Comment:        The GeneXpert MRSA Assay (FDA approved for NASAL specimens only), is one component of a comprehensive MRSA colonization surveillance program. It is not intended to diagnose MRSA infection nor to guide or monitor treatment for MRSA infections.          Radiology Studies: US Renal  Result Date: 02/04/2017 CLINICAL DATA:  Acute kidney  injury EXAM: RENAL / URINARY TRACT ULTRASOUND COMPLETE COMPARISON:  CT 05/23/2016 FINDINGS: Right Kidney: Length: 11.4 cm. Slight increased cortical echogenicity. Mild right hydronephrosis. No mass. Left Kidney: Length: 12.4 cm. Slight increased cortical echogenicity. No hydronephrosis or focal abnormality. Bladder: Foley catheter present in the bladder. IMPRESSION: 1. Mild increased cortical echogenicity of both kidneys, there is mild right hydronephrosis. 2. Foley catheter present in the empty urinary bladder. Electronically Signed   By: Donavan Foil M.D.   On: 02/04/2017 17:52        Scheduled Meds: . aspirin EC  81 mg Oral Daily  . calcium carbonate  1 tablet Oral TID  . clopidogrel  75 mg Oral Q breakfast  . feeding supplement (ENSURE ENLIVE)  237 mL Oral BID BM  . fentaNYL   Intravenous Q4H  . gabapentin  400 mg Oral TID  . heparin  5,000 Units Subcutaneous Q8H  . insulin aspart  0-9 Units Subcutaneous TID WC  . [START ON 02/06/2017] insulin glargine  55 Units Subcutaneous BID  . living well with diabetes book   Does not apply Once  . lurasidone  120 mg Oral QHS  . metoprolol tartrate  12.5 mg Oral BID  .  mometasone-formoterol  2 puff Inhalation BID  . multivitamin with minerals  1 tablet Oral Daily  . nicotine  21 mg Transdermal Daily  . OXcarbazepine  600 mg Oral BID  . protein supplement shake  11 oz Oral BID BM  . simvastatin  40 mg Oral Daily   Continuous Infusions: . dextrose 5 % and 0.9% NaCl    . methocarbamol (ROBAXIN)  IV 500 mg (02/04/17 1441)  . piperacillin-tazobactam (ZOSYN)  IV 3.375 g (02/05/17 0646)     LOS: 3 days      Reyne Dumas, MD [facility] Pager [phone]  If 7PM-7AM, please contact night-coverage [email or web address] Password [facility] 02/05/2017, 8:47 AM

## 2017-02-05 NOTE — Progress Notes (Signed)
Hypoglycemic Event at 2100  CBG: 58  Treatment: icee  Symptoms: drowsy  Follow-up CBG: Time:2147 CBG Result: 65  Treatment: 66mL of IV dextrose 50%  Follow-up CBG result: 153  Possible Reasons for Event: Poor oral intake, pt reports not eating because she doesn't like the food at the hospital  Comments/MD notified: No   Pt now asymptomatic, pt call bell within reach and bed in lowest position with bed alarm in use.  Phelps,Kathy Ligman A 02/04/17

## 2017-02-05 NOTE — Progress Notes (Signed)
[facility] Progress Note    Assessment/ Plan:   1. Non-Oliguric ARF: Likely 2/2 to receiving Toradol and IV contrast in the setting of hypotension further complicated by vanco toxicity and ABLA. Urinary retention due to neurogenic bladder is likely contributing. Renal US with mildly increased cortical echogenicity of both kidneys and mild right hydronephrosis. FENa 2.8%, consistent with intrinsic cause of AKI. CK < 5.   Toradol and Vanc have been discontinued  Continue MIVFs at 183ml/hr  Daily renal function  Avoid nephrotoxic drugs  Urinary retention 2/2 to neurogenic bladder with uncontrolled diabetes. Renal US with mild right hydronephrosis.   Bladder scan volumes >300 x 2, so foley placed 4/27.  Closely monitor urine output  Hyponatremia: Improving. Na 132 > 137. Due to ARF.  Continue IV normal saline  Left foot cellulitis/gangrene/PAD: s/p atherectomy of L popliteal artery, drug coated balloon angioplasty of the L popliteal artery, and balloon angioplasty of the L anterior tibial artery on 4/26.  Per vascular surgery and primary  Poorly controlled DM-2/diabetic neuropathy  Per primary  Gabapentin renally dosed  Hypertension: Normotensive this morning.  Per primary  Subjective:    Patient states she is having a lot of pain this morning. She would like for her toes to be cut off so that she can be done with this pain.    Objective:   BP 136/85 (BP Location: Right Arm)   Pulse 91   Temp 99.3 F (37.4 C) (Oral)   Resp 10   Ht 5\' 9"  (1.753 m)   Wt 135 lb (61.2 kg)   LMP 01/06/2017 (Approximate)   SpO2 100%   BMI 19.94 kg/m   Intake/Output Summary (Last 24 hours) at 02/05/17 1219 Last data filed at 02/05/17 1443  Gross per 24 hour  Intake             2460 ml  Output             2000 ml  Net              460 ml   Weight change:   Physical Exam: GEN: tired-appearing CVS: RRR, no murmurs, 2+ DP pulses bilaterally RESP: CTAB,  normal work of breathing GI: +BS, soft, non-tender, non-distended MSK: Dark gangrenous 3rd and 4th toes in the left foot, mildly erythematous skin in her left foot  Imaging: US Renal  Result Date: 02/04/2017 CLINICAL DATA:  Acute kidney injury EXAM: RENAL / URINARY TRACT ULTRASOUND COMPLETE COMPARISON:  CT 05/23/2016 FINDINGS: Right Kidney: Length: 11.4 cm. Slight increased cortical echogenicity. Mild right hydronephrosis. No mass. Left Kidney: Length: 12.4 cm. Slight increased cortical echogenicity. No hydronephrosis or focal abnormality. Bladder: Foley catheter present in the bladder. IMPRESSION: 1. Mild increased cortical echogenicity of both kidneys, there is mild right hydronephrosis. 2. Foley catheter present in the empty urinary bladder. Electronically Signed   By: Donavan Foil M.D.   On: 02/04/2017 17:52    Labs: BMET  Recent Labs Lab 02/01/17 2343 02/02/17 0615 02/03/17 1423 02/03/17 2116 02/04/17 0429 02/05/17 0330  NA 134* 135  --  133* 132* 137  K 3.5 3.6  --  4.1 3.6 4.1  CL 100* 99*  --  99* 102 102  CO2 28 28  --  25 22 25   GLUCOSE 319* 302*  --  370* 158* 72  BUN 10 11  --  19 22* 24*  CREATININE 0.92 0.87 2.29* 3.10* 3.53* 4.29*  CALCIUM 8.4* 8.2*  --  8.4* 7.8* 8.5*  CBC  Recent Labs Lab 02/01/17 2343 02/02/17 0615 02/03/17 1423 02/04/17 0429 02/05/17 0330  WBC 10.7* 9.5 12.7* 14.0* 13.4*  NEUTROABS 5.7 4.5  --   --   --   HGB 12.5 12.0 12.4 10.7* 10.6*  HCT 35.9* 34.7* 36.3 31.8* 31.5*  MCV 85.9 86.1 88.1 88.3 89.0  PLT 236 209 174 186 198    Medications:    . aspirin EC  81 mg Oral Daily  . calcium carbonate  1 tablet Oral TID  . clopidogrel  75 mg Oral Q breakfast  . feeding supplement (ENSURE ENLIVE)  237 mL Oral BID BM  . fentaNYL   Intravenous Q4H  . gabapentin  400 mg Oral TID  . heparin  5,000 Units Subcutaneous Q8H  . insulin aspart  0-9 Units Subcutaneous TID WC  . [START ON 02/06/2017] insulin glargine  55 Units Subcutaneous BID   . living well with diabetes book   Does not apply Once  . lurasidone  120 mg Oral QHS  . metoprolol tartrate  12.5 mg Oral BID  . mometasone-formoterol  2 puff Inhalation BID  . multivitamin with minerals  1 tablet Oral Daily  . nicotine  21 mg Transdermal Daily  . OXcarbazepine  600 mg Oral BID  . protein supplement shake  11 oz Oral BID BM  . simvastatin  40 mg Oral Daily      Hyman Bible, MD PGY-2 Family Medicine Resident 02/05/2017, 12:19 PM   I have seen and examined this patient and agree with plan and assessment in the above note with renal recommendations/intervention highlighted.  Pt with 1800cc's of UOP over the last 24 hours.  Foley catheter in place.  Continue with IVF's and follow I's/O's and Scr.  No indication for dialysis at this time. Broadus John A Tomoki Lucken,MD 02/05/2017 1:27 PM

## 2017-02-05 NOTE — Progress Notes (Addendum)
   VASCULAR SURGERY ASSESSMENT & PLAN:   2 Days Post-Op s/p: Atherectomy of left popliteal artery, drug coated balloon angioplasty of left popliteal artery, and balloon angioplasty of left anterior tibial artery. The patient has a palpable dorsalis pedis and posterior tibial pulse. I have explained that we need to give the improve circulation chance to work and the antibiotics a chance to work. She understands that she will likely lose her left third fourth and fifth toes.  Her pain is poorly controlled. For this reason I will start a morphine PCA.  Continue intravenous antibiotics (Zosyn).   SUBJECTIVE:    She wants to know why we don't just take her toes off now. Pain poorly controlled.  PHYSICAL EXAM:   Vitals:   02/03/17 2035 02/04/17 0550 02/04/17 1252 02/04/17 2028  BP: (!) 147/85 (!) 95/57 121/77 119/75  Pulse: (!) 103 87 93 97  Resp: 18 18  20   Temp: 99.2 F (37.3 C) 98.7 F (37.1 C)  99.2 F (37.3 C)  TempSrc: Oral Oral  Oral  SpO2: 99% 97% 100% 99%  Weight:      Height:       Palpable left dorsalis pedis and posterior tibial pulse. Gangrene left third fourth and fifth toes. Some persistent cellulitis.  LABS:   Lab Results  Component Value Date   WBC 13.4 (H) 02/05/2017   HGB 10.6 (L) 02/05/2017   HCT 31.5 (L) 02/05/2017   MCV 89.0 02/05/2017   PLT 198 02/05/2017   Lab Results  Component Value Date   CREATININE 4.29 (H) 02/05/2017   Lab Results  Component Value Date   INR 0.96 02/03/2010   CBG (last 3)   Recent Labs  02/04/17 2147 02/04/17 2227 02/05/17 0012  GLUCAP 65 153* 112*    PROBLEM LIST:    Principal Problem:   Cellulitis of left foot Active Problems:   Uncontrolled type 2 diabetes mellitus with hyperglycemia (HCC)   HLD (hyperlipidemia)   Tobacco abuse   Essential hypertension   Malnutrition of moderate degree   AKI (acute kidney injury) ([place])   CURRENT MEDS:   . alprazolam  0.5 mg Oral BID  . aspirin EC  81 mg Oral Daily   . calcium carbonate  1 tablet Oral TID  . clopidogrel  75 mg Oral Q breakfast  . feeding supplement (ENSURE ENLIVE)  237 mL Oral BID BM  . gabapentin  200 mg Oral TID  . heparin  5,000 Units Subcutaneous Q8H  . insulin aspart  0-9 Units Subcutaneous TID WC  . insulin glargine  70 Units Subcutaneous BID  . living well with diabetes book   Does not apply Once  . lurasidone  120 mg Oral QHS  . metoprolol tartrate  12.5 mg Oral BID  . mometasone-formoterol  2 puff Inhalation BID  . multivitamin with minerals  1 tablet Oral Daily  . nicotine  21 mg Transdermal Daily  . OXcarbazepine  600 mg Oral BID  . protein supplement shake  11 oz Oral BID BM  . simvastatin  40 mg Oral Daily    Gae Gallop Beeper: [phone] Office: [phone] 02/05/2017

## 2017-02-05 NOTE — Progress Notes (Signed)
Pharmacy Antibiotic Note  Kathy Phelps is a 36 y.o. female on day #4 Vancomycin and Zosyn for cellulitis.  AKI, Vancomycin on hold.  Received Vanc 1gm IV q12h x 3 doses on 4/25 ~12am, 11am and 9pm. AM vanc dose missed on 4/26 when off the floor for procedure. Random Vanc level 4/26 at ~9pm = 26 mcg/ml, above goal. 24 hrs after last dose.  Random Vanc level today ~ 0330 = 16 mcg/ml, slightly above goal, however, sCr has worsened 3.53 > 4.29 with CrCl now 42ml/min.   Plan: 1) Continue to hold vancomycin 2) Check random vancomycin level in the morning 3) Adjust zosyn to 2.25g IV q6  Height: 5\' 9"  (175.3 cm) Weight: 135 lb (61.2 kg) IBW/kg (Calculated) : 66.2  Temp (24hrs), Avg:98.9 F (37.2 C), Min:98.3 F (36.8 C), Max:99.3 F (37.4 C)   Recent Labs Lab 02/01/17 2343 02/02/17 0615 02/02/17 1404 02/02/17 1552 02/03/17 1423 02/03/17 2116 02/04/17 0429 02/05/17 0330  WBC 10.7* 9.5  --   --  12.7*  --  14.0* 13.4*  CREATININE 0.92 0.87  --   --  2.29* 3.10* 3.53* 4.29*  LATICACIDVEN  --   --  1.5 1.2  --   --   --   --   VANCORANDOM  --   --   --   --   --  26  --  16    Estimated Creatinine Clearance: 17.3 mL/min (A) (by C-G formula based on SCr of 4.29 mg/dL (H)).    Allergies  Allergen Reactions  . Hydrocodone-Acetaminophen Hives  . Tylenol [Acetaminophen] Itching and Swelling    Antimicrobials this admission:  Vanc 4/25 >>   Zosyn 4/24 at 11:47pm >>  Dose adjustments this admission: 4/26 Vanc level = 26 mcg/ml 24hrs after prior dose, but was on Vanc 1gm IV q12h, hold dose 4/28 Vanc level = 16 mcg/ml, continue to hold   Microbiology results:  4/26 MRSA PCR negative  Thank you for allowing pharmacy to be a part of this patient's care.  Deboraha Sprang, [place] 02/05/2017 1:17 PM

## 2017-02-06 DIAGNOSIS — N179 Acute kidney failure, unspecified: Secondary | ICD-10-CM

## 2017-02-06 LAB — GLUCOSE, CAPILLARY
GLUCOSE-CAPILLARY: 244 mg/dL — AB (ref 65–99)
GLUCOSE-CAPILLARY: 108 mg/dL — AB (ref 65–99)
Glucose-Capillary: 162 mg/dL — ABNORMAL HIGH (ref 65–99)
Glucose-Capillary: 176 mg/dL — ABNORMAL HIGH (ref 65–99)
Glucose-Capillary: 176 mg/dL — ABNORMAL HIGH (ref 65–99)

## 2017-02-06 LAB — BASIC METABOLIC PANEL
Anion gap: 8 (ref 5–15)
BUN: 22 mg/dL — AB (ref 6–20)
CHLORIDE: 101 mmol/L (ref 101–111)
CO2: 25 mmol/L (ref 22–32)
Calcium: 8.5 mg/dL — ABNORMAL LOW (ref 8.9–10.3)
Creatinine, Ser: 3.7 mg/dL — ABNORMAL HIGH (ref 0.44–1.00)
GFR calc Af Amer: 17 mL/min — ABNORMAL LOW (ref >60)
GFR calc non Af Amer: 15 mL/min — ABNORMAL LOW (ref >60)
Glucose, Bld: 218 mg/dL — ABNORMAL HIGH (ref 65–99)
POTASSIUM: 3.9 mmol/L (ref 3.5–5.1)
SODIUM: 134 mmol/L — AB (ref 135–145)

## 2017-02-06 LAB — VANCOMYCIN, RANDOM: VANCOMYCIN RM: 11

## 2017-02-06 MED ORDER — SUCRALFATE 1 GM/10ML PO SUSP
1.0000 g | Freq: Three times a day (TID) | ORAL | Status: DC
  Administered 2017-02-06 – 2017-02-08 (×6): 1 g via ORAL
  Filled 2017-02-06 (×15): qty 10

## 2017-02-06 MED ORDER — INSULIN GLARGINE 100 UNIT/ML ~~LOC~~ SOLN
45.0000 [IU] | Freq: Two times a day (BID) | SUBCUTANEOUS | Status: DC
  Administered 2017-02-07: 45 [IU] via SUBCUTANEOUS
  Filled 2017-02-06 (×3): qty 0.45

## 2017-02-06 MED ORDER — HYDROMORPHONE BOLUS VIA INFUSION: 2.0000 mg | INTRAVENOUS | Status: DC | PRN

## 2017-02-06 MED ORDER — PANTOPRAZOLE SODIUM 40 MG PO TBEC
40.0000 mg | DELAYED_RELEASE_TABLET | Freq: Every day | ORAL | Status: DC
  Administered 2017-02-06 – 2017-02-11 (×6): 40 mg via ORAL
  Filled 2017-02-06 (×6): qty 1

## 2017-02-06 MED ORDER — SODIUM CHLORIDE 0.9 % IV SOLN
500.0000 mg | Freq: Once | INTRAVENOUS | Status: AC
  Administered 2017-02-06: 500 mg via INTRAVENOUS
  Filled 2017-02-06: qty 500

## 2017-02-06 MED ORDER — SODIUM CHLORIDE 0.9 % IV SOLN
INTRAVENOUS | Status: DC
  Administered 2017-02-06 – 2017-02-08 (×3): via INTRAVENOUS

## 2017-02-06 MED ORDER — HYDROMORPHONE HCL 1 MG/ML IJ SOLN
2.0000 mg | INTRAMUSCULAR | Status: DC | PRN
  Administered 2017-02-06 (×2): 2 mg via INTRAVENOUS
  Filled 2017-02-06 (×2): qty 2

## 2017-02-06 NOTE — Progress Notes (Signed)
Pharmacy Antibiotic Note  Kathy Phelps is a 36 y.o. female on day #5 Vancomycin and Zosyn for cellulitis.  AKI, Vancomycin on hold.  Received Vanc 1gm IV q12h x 3 doses on 4/25 ~12am, 11am and 9pm. AM vanc dose missed on 4/26 when off the floor for procedure.  Random Vanc level 4/26 at ~9pm = 26 mcg/ml, above goal. 24 hrs after last dose.  Random Vanc level 4/28 ~ 0330 = 16 mcg/ml, slightly above goal, however, sCr has worsened 3.53 > 4.29 with CrCl now 45ml/min.  Random Vanc level today ~ 0330 = 11 mcg/ml with is finally at goal! SCr is also improving 4.29 > 3.7 with CrCl 20 ml/min.   Plan: 1) Give vancomycin 500mg  IV x 1 2) Follow up renal function for further dosing (still would be q48h at this point) 3) Continue zosyn 2.25g IV q6 for now  Height: 5\' 9"  (175.3 cm) Weight: 135 lb (61.2 kg) IBW/kg (Calculated) : 66.2  Temp (24hrs), Avg:98.3 F (36.8 C), Min:98.3 F (36.8 C), Max:98.3 F (36.8 C)   Recent Labs Lab 02/01/17 2343 02/02/17 0615 02/02/17 1404 02/02/17 1552 02/03/17 1423  02/03/17 2116 02/04/17 0429 02/05/17 0330 02/06/17 0324  WBC 10.7* 9.5  --   --  12.7*  --   --  14.0* 13.4*  --   CREATININE 0.92 0.87  --   --  2.29*  --  3.10* 3.53* 4.29* 3.70*  LATICACIDVEN  --   --  1.5 1.2  --   --   --   --   --   --   VANCORANDOM  --   --   --   --   --   < > 26  --  16 11  < > = values in this interval not displayed.  Estimated Creatinine Clearance: 20.1 mL/min (A) (by C-G formula based on SCr of 3.7 mg/dL (H)).    Allergies  Allergen Reactions  . Hydrocodone-Acetaminophen Hives  . Tylenol [Acetaminophen] Itching and Swelling    Antimicrobials this admission:  Vanc 4/25 >>   Zosyn 4/24 at 11:47pm >>  Dose adjustments this admission: 4/26 Vanc level = 26 mcg/ml 24hrs after prior dose, but was on Vanc 1gm IV q12h, hold dose 4/28 Vanc level = 16 mcg/ml, continue to hold 4/29 Vanc level = 11 mcg/ml, give 500mg  IV x 1   Microbiology results:  4/26  MRSA PCR negative  Thank you for allowing pharmacy to be a part of this patient's care.  Deboraha Sprang, [place] 02/06/2017 10:17 AM

## 2017-02-06 NOTE — Progress Notes (Signed)
[facility] Progress Note    Assessment/ Plan:   Non-Oliguric ARF: Likely 2/2 to receiving Toradol and IV contrast in the setting of hypotension further complicated by vanco toxicity and ABLA. Urinary retention due to neurogenic bladder is likely contributing. Renal US with mildly increased cortical echogenicity of both kidneys and mild right hydronephrosis. FENa 2.8%, consistent with intrinsic cause of AKI. CK < 5. UOP -1.2 L. Serum creatinine trended down to 3.7.   Toradol and Vanc have been discontinued  Continue MIVFs at 172ml/hr  Daily renal function  Avoid nephrotoxic drugs  Urinary retention 2/2 to neurogenic bladder with uncontrolled diabetes. Renal US with mild right hydronephrosis.   Continue foley for now  Closely monitor urine output  Suggest avoiding anticholinergic drugs such as Robaxin and diphenhydramine  Hyponatremia: Improving. Na 132 > 134. Due to ARF.  Continue IV normal saline  Left foot cellulitis/gangrene/PAD: s/p atherectomy of L popliteal artery, drug coated balloon angioplasty of the L popliteal artery, and balloon angioplasty of the L anterior tibial artery on 4/26.  Per vascular surgery and primary  Suggest cutting down on anticholinergic and sedating meds.   Poorly controlled DM-2/diabetic neuropathy. CBG 244 this morning  Per primary  Gabapentin renally dosed  Hypertension: Normotensive this morning.  Per primary  Subjective:    Complains left leg pain but she has been sleeping comfortably. She hardly stay awake for longer.    Objective:   BP 109/70 (BP Location: Right Arm)   Pulse 80   Temp 98.3 F (36.8 C) (Oral)   Resp 11   Ht 5\' 9"  (1.753 m)   Wt 135 lb (61.2 kg)   LMP 01/06/2017 (Approximate)   SpO2 100%   BMI 19.94 kg/m   Intake/Output Summary (Last 24 hours) at 02/06/17 0708 Last data filed at 02/05/17 2138  Gross per 24 hour  Intake              945 ml  Output             1225 ml  Net              -280 ml   Weight change:   Physical Exam: GEN: sleepy but arises easily CVS: RRR, no murmurs, 2+ DP pulses bilaterally RESP: CTAB, normal work of breathing GI: +BS, soft, non-tender, non-distended MSK: Dark gangrenous 3rd and 4th toes in the left foot, mildly erythematous skin in her left foot  Imaging: Ct Soft Tissue Neck Wo Contrast  Result Date: 02/05/2017 CLINICAL DATA:  Pain and swelling of the right side of the neck/face for 1 month. EXAM: CT NECK WITHOUT CONTRAST TECHNIQUE: Multidetector CT imaging of the neck was performed following the standard protocol without intravenous contrast. COMPARISON:  None. FINDINGS: Pharynx and larynx: No mucosal or submucosal lesion. Salivary glands: Parotid and submandibular glands are normal. Thyroid: Normal Lymph nodes: No enlarged or low-density nodes on either side of the neck. Slightly prominent right submandibular nodes. See below. Vascular: Normal without contrast Limited intracranial: Normal Visualized orbits: Normal Mastoids and visualized paranasal sinuses: Clear Skeleton: Dental decay and periodontal disease extensively. Upper chest: Emphysema without dominant bullous disease. Other: In the region of concern, just inferior to the right body of the mandible, there is an indistinct region of increased density within the subcutaneous fat measuring about 1.7 cm in diameter. This also involves the platysma muscle and shows some inflammation extending just deep to the platysma muscle within the submandibular space. This is presumed represent infectious inflammation. No  sign of radiopaque foreign object. Adjacent submandibular nodes are slightly prominent. IMPRESSION: 1.7 cm in diameter region of indistinct increased density in the right submandibular region with the epicenter in the subcutaneous fat but with involvement of the platysma muscle and mild extension into the submandibular space. This is most consistent with nonspecific infectious inflammation.  Mild reactive enlargement of the adjacent submandibular lymph nodes. No involvement of the submandibular gland itself. No evidence of radiopaque foreign object. Soft tissue neoplasm is not excluded, but seems much less likely than inflammatory disease. Dental decay and periodontal disease. Electronically Signed   By: Nelson Chimes M.D.   On: 02/05/2017 13:11   US Renal  Result Date: 02/04/2017 CLINICAL DATA:  Acute kidney injury EXAM: RENAL / URINARY TRACT ULTRASOUND COMPLETE COMPARISON:  CT 05/23/2016 FINDINGS: Right Kidney: Length: 11.4 cm. Slight increased cortical echogenicity. Mild right hydronephrosis. No mass. Left Kidney: Length: 12.4 cm. Slight increased cortical echogenicity. No hydronephrosis or focal abnormality. Bladder: Foley catheter present in the bladder. IMPRESSION: 1. Mild increased cortical echogenicity of both kidneys, there is mild right hydronephrosis. 2. Foley catheter present in the empty urinary bladder. Electronically Signed   By: Donavan Foil M.D.   On: 02/04/2017 17:52    Labs: BMET  Recent Labs Lab 02/01/17 2343 02/02/17 0615 02/03/17 1423 02/03/17 2116 02/04/17 0429 02/05/17 0330 02/06/17 0324  NA 134* 135  --  133* 132* 137 134*  K 3.5 3.6  --  4.1 3.6 4.1 3.9  CL 100* 99*  --  99* 102 102 101  CO2 28 28  --  25 22 25 25   GLUCOSE 319* 302*  --  370* 158* 72 218*  BUN 10 11  --  19 22* 24* 22*  CREATININE 0.92 0.87 2.29* 3.10* 3.53* 4.29* 3.70*  CALCIUM 8.4* 8.2*  --  8.4* 7.8* 8.5* 8.5*   CBC  Recent Labs Lab 02/01/17 2343 02/02/17 0615 02/03/17 1423 02/04/17 0429 02/05/17 0330  WBC 10.7* 9.5 12.7* 14.0* 13.4*  NEUTROABS 5.7 4.5  --   --   --   HGB 12.5 12.0 12.4 10.7* 10.6*  HCT 35.9* 34.7* 36.3 31.8* 31.5*  MCV 85.9 86.1 88.1 88.3 89.0  PLT 236 209 174 186 198    Medications:    . aspirin EC  81 mg Oral Daily  . calcium carbonate  1 tablet Oral TID  . clopidogrel  75 mg Oral Q breakfast  . feeding supplement (ENSURE ENLIVE)  237 mL  Oral BID BM  . fentaNYL   Intravenous Q4H  . gabapentin  400 mg Oral TID  . heparin  5,000 Units Subcutaneous Q8H  . insulin aspart  0-9 Units Subcutaneous TID WC  . insulin glargine  55 Units Subcutaneous BID  . living well with diabetes book   Does not apply Once  . lurasidone  120 mg Oral QHS  . metoprolol tartrate  12.5 mg Oral BID  . mometasone-formoterol  2 puff Inhalation BID  . multivitamin with minerals  1 tablet Oral Daily  . nicotine  21 mg Transdermal Daily  . OXcarbazepine  600 mg Oral BID  . protein supplement shake  11 oz Oral BID BM  . simvastatin  40 mg Oral Daily    Wendee Beavers, MD.  PGY-2 Pager [phone] 02/06/17  7:08 AM  I have seen and examined this patient and agree with plan and assessment in the above note with renal recommendations/intervention highlighted.  Pt complaining of left leg pain, however she was  sound asleep prior to visit.  VVS following.  Scr improving with IVF's, will continue to follow for now.  Wean morphine pca per primary and VVS. Governor Rooks Mohab Ashby,MD 02/06/2017 1:00 PM

## 2017-02-06 NOTE — Progress Notes (Signed)
PROGRESS NOTE    Kathy Phelps  XKG:[id number] DOB: 09/27/1981 DOA: 02/01/2017 PCP: Rubbie Battiest, NP     Brief Narrative:  36 yo female who presented to the hospital with the chief complain of left foot pain. Worsening symptoms for the last 4 weeks, refractive to outpatient oral antibiotic therapy. Initial evaluation patient found tachycardic. Left foot with significant erythema. Admitted for cellulitis and critical left lower extremity ischemia   Assessment & Plan:   Principal Problem:   Cellulitis of left foot Active Problems:   Uncontrolled type 2 diabetes mellitus with hyperglycemia (HCC)   HLD (hyperlipidemia)   Tobacco abuse   Essential hypertension   Malnutrition of moderate degree   AKI (acute kidney injury) ([place])    Acute kidney injury, creatinine went from 0.87>3.53>4.29>3.7 Urine output 1225  cc last 24 hrs  multi-factorial with ischemic ATN, contrast nephropathy and possible papillary necrosis from NSAIDs.Urinary retention due to neurogenic bladder is likely contributing Blood pressure soft postprocedure Will hydrate with IV fluids and follow renal function Appreciate nephrology input,     Left foot cellulitis with possible gangrene.  Hold vancomycin given acute kidney injury and continue zosyn IV, will follow on cell count, cultures and temperature curve.   Left ABI indicated moderate peripheral arterial disease, status post aortogram/balloon angioplasty by vascular 4/26. .  Buddy Duty on fentanyl PCA by Dr. Scot Dock for uncontrolled pain. Patient and Dr. Donzetta Matters will make a decision about the timing of amputation  T2DM. Uncontrolled-noted to be hypoglycemic in the setting of recent change in renal function, resume Lantus at a lower dose  hemoglobin A1c  11.8 Dose of gabapentin reduced secondary to acute kidney injury  HTN. Continue blood pressure control with metoprolol with hold parameters.   Tobacco abuse,on  nicotine patch  Dyslipidemia. Continue  statin therapy with simvastatin.   Bipolar disorder/depression/anxiety/schizophrenia. Lot of psychosocial stressors, Continue trileptal. latuda and alprazolam. Adjust dose for renal function   Right submandibular mass.   soft tissue ultrasound showed ill-defined hypoechoic mass in the submandibular region measuring 2.5 x 1.3 x 2.0 cm. CT head and neck -1.7 cm right submandibular mass, possibly infectious, neoplasm cannot be ruled out, will need outpatient oral surgery evaluation  Urinary retention 2/2 to neurogenic bladder with uncontrolled diabetes. Renal US with mild right hydronephrosis. Patient states that previously Flomax has not worked, continue indwelling Foley. Will need urology follow-up         DVT prophylaxis: scd  Code Status: full  Family Communication: discussed with daughter and husband in the room Disposition Plan:  Will need SNF, not ready for discharge  Consultants:   Vascular  Nephrology  Procedures:   Aortogram 4/26   Antimicrobials:   Vancomycin  Zosyn     Subjective: Pain controlled on  fentanyl PCA   Objective: Vitals:   02/06/17 0500 02/06/17 0533 02/06/17 0730 02/06/17 0738  BP: 109/70     Pulse: 80     Resp: 12 11 11    Temp:      TempSrc: Oral     SpO2: 100% 100% 100% 100%  Weight:      Height:        Intake/Output Summary (Last 24 hours) at 02/06/17 0907 Last data filed at 02/06/17 0758  Gross per 24 hour  Intake              705 ml  Output             1875 ml  Net            -  1170 ml   Filed Weights   02/02/17 0242  Weight: 61.2 kg (135 lb)    Examination:  General exam: deconditioned and ill looking appearing E ENT: mild pallor, no icterus.  Respiratory system: Clear to auscultation. Respiratory effort normal. Cardiovascular system: S1 & S2 heard, RRR. No JVD, murmurs, rubs, gallops or clicks. No pedal edema. Gastrointestinal system: Abdomen is nondistended, soft and nontender. No organomegaly or masses felt. Normal  bowel sounds heard. Central nervous system: Alert and oriented. No focal neurological deficits. Extremities: Symmetric 5 x 5 power. Skin: Left foot with 3th and 4th toe discoloration, cyanotic with decrease local temperature, pulses are palpable. Noted for foot erythema, expanding to the leg, with ill defined margins.  .     Data Reviewed: I have personally reviewed following labs and imaging studies  CBC:  Recent Labs Lab 02/01/17 2343 02/02/17 0615 02/03/17 1423 02/04/17 0429 02/05/17 0330  WBC 10.7* 9.5 12.7* 14.0* 13.4*  NEUTROABS 5.7 4.5  --   --   --   HGB 12.5 12.0 12.4 10.7* 10.6*  HCT 35.9* 34.7* 36.3 31.8* 31.5*  MCV 85.9 86.1 88.1 88.3 89.0  PLT 236 209 174 186 846   Basic Metabolic Panel:  Recent Labs Lab 02/02/17 0615 02/03/17 1423 02/03/17 2116 02/04/17 0429 02/05/17 0330 02/06/17 0324  NA 135  --  133* 132* 137 134*  K 3.6  --  4.1 3.6 4.1 3.9  CL 99*  --  99* 102 102 101  CO2 28  --  25 22 25 25   GLUCOSE 302*  --  370* 158* 72 218*  BUN 11  --  19 22* 24* 22*  CREATININE 0.87 2.29* 3.10* 3.53* 4.29* 3.70*  CALCIUM 8.2*  --  8.4* 7.8* 8.5* 8.5*   GFR: Estimated Creatinine Clearance: 20.1 mL/min (A) (by C-G formula based on SCr of 3.7 mg/dL (H)). Liver Function Tests:  Recent Labs Lab 02/02/17 0615 02/04/17 0429 02/05/17 0330  AST 40 42* 37  ALT 10* 13* 14  ALKPHOS 63 101 132*  BILITOT 0.3 0.7 0.8  PROT 6.1* 5.2* 5.7*  ALBUMIN 2.6* 2.0* 2.0*   No results for input(s): LIPASE, AMYLASE in the last 168 hours. No results for input(s): AMMONIA in the last 168 hours. Coagulation Profile: No results for input(s): INR, PROTIME in the last 168 hours. Cardiac Enzymes:  Recent Labs Lab 02/04/17 0429  CKTOTAL <5*   BNP (last 3 results) No results for input(s): PROBNP in the last 8760 hours. HbA1C: No results for input(s): HGBA1C in the last 72 hours. CBG:  Recent Labs Lab 02/05/17 0752 02/05/17 1137 02/05/17 1631 02/05/17 2140  02/06/17 0629  GLUCAP 76 131* 135* 191* 244*   Lipid Profile: No results for input(s): CHOL, HDL, LDLCALC, TRIG, CHOLHDL, LDLDIRECT in the last 72 hours. Thyroid Function Tests: No results for input(s): TSH, T4TOTAL, FREET4, T3FREE, THYROIDAB in the last 72 hours. Anemia Panel: No results for input(s): VITAMINB12, FOLATE, FERRITIN, TIBC, IRON, RETICCTPCT in the last 72 hours. Sepsis Labs:  Recent Labs Lab 02/02/17 1404 02/02/17 1552  LATICACIDVEN 1.5 1.2    Recent Results (from the past 240 hour(s))  MRSA PCR Screening     Status: None   Collection Time: 02/03/17 12:25 AM  Result Value Ref Range Status   MRSA by PCR NEGATIVE NEGATIVE Final    Comment:        The GeneXpert MRSA Assay (FDA approved for NASAL specimens only), is one component of a comprehensive MRSA colonization surveillance  program. It is not intended to diagnose MRSA infection nor to guide or monitor treatment for MRSA infections.          Radiology Studies: Ct Soft Tissue Neck Wo Contrast  Result Date: 02/05/2017 CLINICAL DATA:  Pain and swelling of the right side of the neck/face for 1 month. EXAM: CT NECK WITHOUT CONTRAST TECHNIQUE: Multidetector CT imaging of the neck was performed following the standard protocol without intravenous contrast. COMPARISON:  None. FINDINGS: Pharynx and larynx: No mucosal or submucosal lesion. Salivary glands: Parotid and submandibular glands are normal. Thyroid: Normal Lymph nodes: No enlarged or low-density nodes on either side of the neck. Slightly prominent right submandibular nodes. See below. Vascular: Normal without contrast Limited intracranial: Normal Visualized orbits: Normal Mastoids and visualized paranasal sinuses: Clear Skeleton: Dental decay and periodontal disease extensively. Upper chest: Emphysema without dominant bullous disease. Other: In the region of concern, just inferior to the right body of the mandible, there is an indistinct region of increased  density within the subcutaneous fat measuring about 1.7 cm in diameter. This also involves the platysma muscle and shows some inflammation extending just deep to the platysma muscle within the submandibular space. This is presumed represent infectious inflammation. No sign of radiopaque foreign object. Adjacent submandibular nodes are slightly prominent. IMPRESSION: 1.7 cm in diameter region of indistinct increased density in the right submandibular region with the epicenter in the subcutaneous fat but with involvement of the platysma muscle and mild extension into the submandibular space. This is most consistent with nonspecific infectious inflammation. Mild reactive enlargement of the adjacent submandibular lymph nodes. No involvement of the submandibular gland itself. No evidence of radiopaque foreign object. Soft tissue neoplasm is not excluded, but seems much less likely than inflammatory disease. Dental decay and periodontal disease. Electronically Signed   By: Nelson Chimes M.D.   On: 02/05/2017 13:11   US Renal  Result Date: 02/04/2017 CLINICAL DATA:  Acute kidney injury EXAM: RENAL / URINARY TRACT ULTRASOUND COMPLETE COMPARISON:  CT 05/23/2016 FINDINGS: Right Kidney: Length: 11.4 cm. Slight increased cortical echogenicity. Mild right hydronephrosis. No mass. Left Kidney: Length: 12.4 cm. Slight increased cortical echogenicity. No hydronephrosis or focal abnormality. Bladder: Foley catheter present in the bladder. IMPRESSION: 1. Mild increased cortical echogenicity of both kidneys, there is mild right hydronephrosis. 2. Foley catheter present in the empty urinary bladder. Electronically Signed   By: Donavan Foil M.D.   On: 02/04/2017 17:52        Scheduled Meds: . aspirin EC  81 mg Oral Daily  . calcium carbonate  1 tablet Oral TID  . clopidogrel  75 mg Oral Q breakfast  . feeding supplement (ENSURE ENLIVE)  237 mL Oral BID BM  . fentaNYL   Intravenous Q4H  . gabapentin  400 mg Oral TID  .  heparin  5,000 Units Subcutaneous Q8H  . insulin aspart  0-9 Units Subcutaneous TID WC  . insulin glargine  45 Units Subcutaneous BID  . living well with diabetes book   Does not apply Once  . lurasidone  120 mg Oral QHS  . metoprolol tartrate  12.5 mg Oral BID  . mometasone-formoterol  2 puff Inhalation BID  . multivitamin with minerals  1 tablet Oral Daily  . nicotine  21 mg Transdermal Daily  . OXcarbazepine  600 mg Oral BID  . protein supplement shake  11 oz Oral BID BM  . simvastatin  40 mg Oral Daily   Continuous Infusions: . methocarbamol (ROBAXIN)  IV  500 mg (02/04/17 1441)  . piperacillin-tazobactam (ZOSYN)  IV 2.25 g (02/06/17 0850)     LOS: 4 days      Reyne Dumas, MD [facility] Pager [phone]  If 7PM-7AM, please contact night-coverage [email or web address] Password [email or web address] 02/06/2017, 9:07 AM

## 2017-02-06 NOTE — Progress Notes (Signed)
   VASCULAR SURGERY ASSESSMENT & PLAN:   3 Days Post-Op s/p: Atherectomy of left popliteal artery, drug coated balloon angioplasty of left popliteal artery, and balloon angioplasty of left anterior tibial artery. The patient has a palpable dorsalis pedis pulse.   Dr. Donzetta Matters has arranged a f/u visit in 2-3 weeks.   On a morphine PCA for pain control.  Continue intravenous antibiotics (Zosyn and Vanco).  AKI: crt today is 3.7 which is down from 4.3 yesterday. Dr. Marval Regal is following.   SUBJECTIVE:    Pain controlled now.   PHYSICAL EXAM:   Vitals:   02/05/17 2138 02/06/17 0051 02/06/17 0500 02/06/17 0533  BP: 140/90  109/70   Pulse: 96  80   Resp:  10 12 11   Temp:      TempSrc:   Oral   SpO2:  100% 100% 100%  Weight:      Height:       Palpable left DP pulse.   LABS:   Lab Results  Component Value Date   WBC 13.4 (H) 02/05/2017   HGB 10.6 (L) 02/05/2017   HCT 31.5 (L) 02/05/2017   MCV 89.0 02/05/2017   PLT 198 02/05/2017   Lab Results  Component Value Date   CREATININE 3.70 (H) 02/06/2017   Lab Results  Component Value Date   INR 0.96 02/03/2010   CBG (last 3)   Recent Labs  02/05/17 1631 02/05/17 2140 02/06/17 0629  GLUCAP 135* 191* 244*    PROBLEM LIST:    Principal Problem:   Cellulitis of left foot Active Problems:   Uncontrolled type 2 diabetes mellitus with hyperglycemia (HCC)   HLD (hyperlipidemia)   Tobacco abuse   Essential hypertension   Malnutrition of moderate degree   AKI (acute kidney injury) ([place])   CURRENT MEDS:   . aspirin EC  81 mg Oral Daily  . calcium carbonate  1 tablet Oral TID  . clopidogrel  75 mg Oral Q breakfast  . feeding supplement (ENSURE ENLIVE)  237 mL Oral BID BM  . fentaNYL   Intravenous Q4H  . gabapentin  400 mg Oral TID  . heparin  5,000 Units Subcutaneous Q8H  . insulin aspart  0-9 Units Subcutaneous TID WC  . insulin glargine  55 Units Subcutaneous BID  . living well with diabetes book   Does  not apply Once  . lurasidone  120 mg Oral QHS  . metoprolol tartrate  12.5 mg Oral BID  . mometasone-formoterol  2 puff Inhalation BID  . multivitamin with minerals  1 tablet Oral Daily  . nicotine  21 mg Transdermal Daily  . OXcarbazepine  600 mg Oral BID  . protein supplement shake  11 oz Oral BID BM  . simvastatin  40 mg Oral Daily    Gae Gallop Beeper: [phone] Office: [phone] 02/06/2017

## 2017-02-07 ENCOUNTER — Telehealth: Payer: Self-pay | Admitting: Vascular Surgery

## 2017-02-07 DIAGNOSIS — L03032 Cellulitis of left toe: Secondary | ICD-10-CM

## 2017-02-07 DIAGNOSIS — L0390 Cellulitis, unspecified: Secondary | ICD-10-CM

## 2017-02-07 LAB — RENAL FUNCTION PANEL
Albumin: 2.2 g/dL — ABNORMAL LOW (ref 3.5–5.0)
Anion gap: 8 (ref 5–15)
BUN: 19 mg/dL (ref 6–20)
CHLORIDE: 102 mmol/L (ref 101–111)
CO2: 28 mmol/L (ref 22–32)
Calcium: 9.3 mg/dL (ref 8.9–10.3)
Creatinine, Ser: 3.49 mg/dL — ABNORMAL HIGH (ref 0.44–1.00)
GFR calc Af Amer: 18 mL/min — ABNORMAL LOW (ref >60)
GFR, EST NON AFRICAN AMERICAN: 16 mL/min — AB (ref >60)
Glucose, Bld: 170 mg/dL — ABNORMAL HIGH (ref 65–99)
POTASSIUM: 4.8 mmol/L (ref 3.5–5.1)
Phosphorus: 5.6 mg/dL — ABNORMAL HIGH (ref 2.5–4.6)
Sodium: 138 mmol/L (ref 135–145)

## 2017-02-07 LAB — CBC
HEMATOCRIT: 30.9 % — AB (ref 36.0–46.0)
Hemoglobin: 10.2 g/dL — ABNORMAL LOW (ref 12.0–15.0)
MCH: 29.9 pg (ref 26.0–34.0)
MCHC: 33 g/dL (ref 30.0–36.0)
MCV: 90.6 fL (ref 78.0–100.0)
Platelets: 227 10*3/uL (ref 150–400)
RBC: 3.41 MIL/uL — ABNORMAL LOW (ref 3.87–5.11)
RDW: 12.7 % (ref 11.5–15.5)
WBC: 10 10*3/uL (ref 4.0–10.5)

## 2017-02-07 LAB — GLUCOSE, CAPILLARY
GLUCOSE-CAPILLARY: 264 mg/dL — AB (ref 65–99)
GLUCOSE-CAPILLARY: 198 mg/dL — AB (ref 65–99)
Glucose-Capillary: 111 mg/dL — ABNORMAL HIGH (ref 65–99)
Glucose-Capillary: 90 mg/dL (ref 65–99)

## 2017-02-07 MED ORDER — OXYCODONE HCL ER 10 MG PO T12A
10.0000 mg | EXTENDED_RELEASE_TABLET | Freq: Two times a day (BID) | ORAL | Status: DC
  Administered 2017-02-07 – 2017-02-11 (×9): 10 mg via ORAL
  Filled 2017-02-07 (×10): qty 1

## 2017-02-07 MED ORDER — INSULIN GLARGINE 100 UNIT/ML ~~LOC~~ SOLN
35.0000 [IU] | Freq: Two times a day (BID) | SUBCUTANEOUS | Status: DC
  Administered 2017-02-07: 35 [IU] via SUBCUTANEOUS
  Filled 2017-02-07 (×3): qty 0.35

## 2017-02-07 MED ORDER — HYDROMORPHONE HCL 1 MG/ML IJ SOLN: 2.0000 mg | INTRAMUSCULAR | Status: DC | PRN

## 2017-02-07 MED ORDER — FENTANYL CITRATE (PF) 100 MCG/2ML IJ SOLN
25.0000 ug | INTRAMUSCULAR | Status: DC | PRN
  Administered 2017-02-07 – 2017-02-10 (×4): 25 ug via INTRAVENOUS
  Filled 2017-02-07 (×4): qty 2

## 2017-02-07 NOTE — Progress Notes (Signed)
PROGRESS NOTE    Kathy Phelps  YSA:[id number] DOB: 1981/08/19 DOA: 02/01/2017 PCP: Rubbie Battiest, NP     Brief Narrative:  36 yo female who presented to the hospital with the chief complain of left foot pain. Worsening symptoms for the last 4 weeks, refractive to outpatient oral antibiotic therapy. Initial evaluation patient found tachycardic. Left foot with significant erythema. Admitted for cellulitis and critical left lower extremity ischemia   Assessment & Plan:   Principal Problem:   Cellulitis of left foot Active Problems:   Uncontrolled type 2 diabetes mellitus with hyperglycemia (HCC)   HLD (hyperlipidemia)   Tobacco abuse   Essential hypertension   Malnutrition of moderate degree   AKI (acute kidney injury) ([place])   Cellulitis    Acute kidney injury, creatinine went from 0.87>3.53>4.29>3.7>3.49-now improving Urine output  2350  cc last 24 hrs  multi-factorial with ischemic ATN, contrast nephropathy and possible papillary necrosis from NSAIDs.Urinary retention due to neurogenic bladder is likely contributing, Renal US with mildly increased cortical echogenicity of both kidneys and mild right hydronephrosis Blood pressure soft post procedure 4/26 , now stable  Continue IV fluids Appreciate nephrology input,    Left foot cellulitis with possible gangrene.   Holding vancomycin given acute kidney injury and continuing zosyn IV,     Left ABI indicated moderate peripheral arterial disease, status post aortogram/balloon angioplasty by vascular 4/26. .  Buddy Duty on fentanyl PCA by Dr. Scot Dock for uncontrolled pain. Fentanyl PCA discontinued/29 due to somnolent and respiratory depression, requiring Narcan Patient has now been transitioned to low dose OxyContin, oxycodone, when necessary iv Dilaudid. Minimize IV narcotics Dr. Donzetta Matters will make a decision about the timing of amputation   T2DM. Uncontrolled-noted to be hypoglycemic in the setting of recent change in  renal function, resumed Lantus at a lower dose  hemoglobin A1c  11.8. Now requiring periodic adjustments in insulin dosing Dose of gabapentin reduced secondary to acute kidney injury Decrease dose of Lantus to 35 units twice a day and monitor  HTN. Continue blood pressure control with metoprolol with hold parameters.   Tobacco abuse,on  nicotine patch  Dyslipidemia. Continue statin therapy with simvastatin.   Bipolar disorder/depression/anxiety/schizophrenia. Lot of psychosocial stressors,  Continue trileptal. latuda and alprazolam. Adjust dose for renal function   Right submandibular mass.   soft tissue ultrasound showed ill-defined hypoechoic mass in the submandibular region measuring 2.5 x 1.3 x 2.0 cm. CT head and neck -1.7 cm right submandibular mass, possibly infectious, neoplasm cannot be ruled out, will need outpatient oral surgery evaluation  Urinary retention 2/2 to neurogenic bladder with uncontrolled diabetes. Renal US with mild right hydronephrosis. Patient states that previously Flomax has not worked, continue indwelling Foley. Will need urology follow-up         DVT prophylaxis: scd  Code Status: full  Family Communication: discussed with daughter and husband in the room Disposition Plan:  As per vascular surgery  Consultants:   Vascular  Nephrology  Procedures:   Aortogram 4/26   Antimicrobials:   Vancomycin  Zosyn     Subjective:  Extremely agitated, tangential speech, cannot focus on one topic  Objective: Vitals:   02/06/17 2136 02/06/17 2259 02/07/17 0341 02/07/17 0857  BP:  (!) 146/99 (!) 157/92   Pulse:  95 (!) 102   Resp:   12   Temp:   98.8 F (37.1 C)   TempSrc:   Oral   SpO2: 98%  98% 98%  Weight:  Height:        Intake/Output Summary (Last 24 hours) at 02/07/17 0906 Last data filed at 02/07/17 0700  Gross per 24 hour  Intake          1573.33 ml  Output             2525 ml  Net          -951.67 ml   Filed Weights    02/02/17 0242  Weight: 61.2 kg (135 lb)    Examination:  General exam: deconditioned and ill looking appearing E ENT: mild pallor, no icterus.  Respiratory system: Clear to auscultation. Respiratory effort normal. Cardiovascular system: S1 & S2 heard, RRR. No JVD, murmurs, rubs, gallops or clicks. No pedal edema. Gastrointestinal system: Abdomen is nondistended, soft and nontender. No organomegaly or masses felt. Normal bowel sounds heard. Central nervous system: Alert and oriented. No focal neurological deficits. Extremities: Symmetric 5 x 5 power. Skin: Left foot with 3th and 4th toe discoloration, cyanotic with decrease local temperature, pulses are palpable. Noted for foot erythema, expanding to the leg, with ill defined margins.  .     Data Reviewed: I have personally reviewed following labs and imaging studies  CBC:  Recent Labs Lab 02/01/17 2343 02/02/17 0615 02/03/17 1423 02/04/17 0429 02/05/17 0330 02/07/17 0204  WBC 10.7* 9.5 12.7* 14.0* 13.4* 10.0  NEUTROABS 5.7 4.5  --   --   --   --   HGB 12.5 12.0 12.4 10.7* 10.6* 10.2*  HCT 35.9* 34.7* 36.3 31.8* 31.5* 30.9*  MCV 85.9 86.1 88.1 88.3 89.0 90.6  PLT 236 209 174 186 198 893   Basic Metabolic Panel:  Recent Labs Lab 02/03/17 2116 02/04/17 0429 02/05/17 0330 02/06/17 0324 02/07/17 0204  NA 133* 132* 137 134* 138  K 4.1 3.6 4.1 3.9 4.8  CL 99* 102 102 101 102  CO2 25 22 25 25 28   GLUCOSE 370* 158* 72 218* 170*  BUN 19 22* 24* 22* 19  CREATININE 3.10* 3.53* 4.29* 3.70* 3.49*  CALCIUM 8.4* 7.8* 8.5* 8.5* 9.3  PHOS  --   --   --   --  5.6*   GFR: Estimated Creatinine Clearance: 21.3 mL/min (A) (by C-G formula based on SCr of 3.49 mg/dL (H)). Liver Function Tests:  Recent Labs Lab 02/02/17 0615 02/04/17 0429 02/05/17 0330 02/07/17 0204  AST 40 42* 37  --   ALT 10* 13* 14  --   ALKPHOS 63 101 132*  --   BILITOT 0.3 0.7 0.8  --   PROT 6.1* 5.2* 5.7*  --   ALBUMIN 2.6* 2.0* 2.0* 2.2*   No  results for input(s): LIPASE, AMYLASE in the last 168 hours. No results for input(s): AMMONIA in the last 168 hours. Coagulation Profile: No results for input(s): INR, PROTIME in the last 168 hours. Cardiac Enzymes:  Recent Labs Lab 02/04/17 0429  CKTOTAL <5*   BNP (last 3 results) No results for input(s): PROBNP in the last 8760 hours. HbA1C: No results for input(s): HGBA1C in the last 72 hours. CBG:  Recent Labs Lab 02/06/17 1111 02/06/17 1645 02/06/17 2115 02/06/17 2316 02/07/17 0628  GLUCAP 162* 108* 176* 176* 111*   Lipid Profile: No results for input(s): CHOL, HDL, LDLCALC, TRIG, CHOLHDL, LDLDIRECT in the last 72 hours. Thyroid Function Tests: No results for input(s): TSH, T4TOTAL, FREET4, T3FREE, THYROIDAB in the last 72 hours. Anemia Panel: No results for input(s): VITAMINB12, FOLATE, FERRITIN, TIBC, IRON, RETICCTPCT in the last 72  hours. Sepsis Labs:  Recent Labs Lab 02/02/17 1404 02/02/17 1552  LATICACIDVEN 1.5 1.2    Recent Results (from the past 240 hour(s))  MRSA PCR Screening     Status: None   Collection Time: 02/03/17 12:25 AM  Result Value Ref Range Status   MRSA by PCR NEGATIVE NEGATIVE Final    Comment:        The GeneXpert MRSA Assay (FDA approved for NASAL specimens only), is one component of a comprehensive MRSA colonization surveillance program. It is not intended to diagnose MRSA infection nor to guide or monitor treatment for MRSA infections.          Radiology Studies: Ct Soft Tissue Neck Wo Contrast  Result Date: 02/05/2017 CLINICAL DATA:  Pain and swelling of the right side of the neck/face for 1 month. EXAM: CT NECK WITHOUT CONTRAST TECHNIQUE: Multidetector CT imaging of the neck was performed following the standard protocol without intravenous contrast. COMPARISON:  None. FINDINGS: Pharynx and larynx: No mucosal or submucosal lesion. Salivary glands: Parotid and submandibular glands are normal. Thyroid: Normal Lymph  nodes: No enlarged or low-density nodes on either side of the neck. Slightly prominent right submandibular nodes. See below. Vascular: Normal without contrast Limited intracranial: Normal Visualized orbits: Normal Mastoids and visualized paranasal sinuses: Clear Skeleton: Dental decay and periodontal disease extensively. Upper chest: Emphysema without dominant bullous disease. Other: In the region of concern, just inferior to the right body of the mandible, there is an indistinct region of increased density within the subcutaneous fat measuring about 1.7 cm in diameter. This also involves the platysma muscle and shows some inflammation extending just deep to the platysma muscle within the submandibular space. This is presumed represent infectious inflammation. No sign of radiopaque foreign object. Adjacent submandibular nodes are slightly prominent. IMPRESSION: 1.7 cm in diameter region of indistinct increased density in the right submandibular region with the epicenter in the subcutaneous fat but with involvement of the platysma muscle and mild extension into the submandibular space. This is most consistent with nonspecific infectious inflammation. Mild reactive enlargement of the adjacent submandibular lymph nodes. No involvement of the submandibular gland itself. No evidence of radiopaque foreign object. Soft tissue neoplasm is not excluded, but seems much less likely than inflammatory disease. Dental decay and periodontal disease. Electronically Signed   By: Nelson Chimes M.D.   On: 02/05/2017 13:11        Scheduled Meds: . aspirin EC  81 mg Oral Daily  . calcium carbonate  1 tablet Oral TID  . clopidogrel  75 mg Oral Q breakfast  . feeding supplement (ENSURE ENLIVE)  237 mL Oral BID BM  . gabapentin  400 mg Oral TID  . heparin  5,000 Units Subcutaneous Q8H  . insulin aspart  0-9 Units Subcutaneous TID WC  . insulin glargine  35 Units Subcutaneous BID  . living well with diabetes book   Does not  apply Once  . lurasidone  120 mg Oral QHS  . metoprolol tartrate  12.5 mg Oral BID  . mometasone-formoterol  2 puff Inhalation BID  . multivitamin with minerals  1 tablet Oral Daily  . nicotine  21 mg Transdermal Daily  . OXcarbazepine  600 mg Oral BID  . oxyCODONE  10 mg Oral Q12H  . pantoprazole  40 mg Oral Daily  . protein supplement shake  11 oz Oral BID BM  . simvastatin  40 mg Oral Daily  . sucralfate  1 g Oral TID WC & HS  Continuous Infusions: . sodium chloride 100 mL/hr at 02/06/17 1535  . methocarbamol (ROBAXIN)  IV 500 mg (02/04/17 1441)  . piperacillin-tazobactam (ZOSYN)  IV Stopped (02/07/17 0251)     LOS: 5 days      Reyne Dumas, MD [facility] Pager [phone]  If 7PM-7AM, please contact night-coverage [email or web address] Password [email or web address] 02/07/2017, 9:06 AM

## 2017-02-07 NOTE — Progress Notes (Signed)
[facility] Progress Note    Assessment/ Plan:   Non-Oliguric ARF: Likely 2/2 to receiving Toradol and IV contrast in the setting of hypotension.  Urinary retention due to neurogenic bladder is likely contributing. Renal US with mildly increased cortical echogenicity of both kidneys and mild right hydronephrosis. FENa 2.8%, consistent with intrinsic cause of AKI. CK < 5. UOP -3.2 L. Net -1.6/24 hrs. Serum creatinine trended down to 3.5.   Toradol and Vanc have been discontinued  Continue MIVFs at 123ml/hr. No signs of fluid overload.   Daily renal function  Avoid nephrotoxic drugs  Urinary retention 2/2 to neurogenic bladder with uncontrolled diabetes. Renal US with mild right hydronephrosis.   Continue foley for now  Closely monitor urine output  Suggest avoiding anticholinergic drugs such as Robaxin and diphenhydramine  Hyponatremia: Resolved. Due to ARF.  Continue IV normal saline  Left foot cellulitis/gangrene/PAD: s/p atherectomy of L popliteal artery, drug coated balloon angioplasty of the L popliteal artery, and balloon angioplasty of the L anterior tibial artery on 4/26.  Per vascular surgery and primary  Suggest cutting down on anticholinergic and sedating meds.   Poorly controlled DM-2/diabetic neuropathy. CBG 111 this morning  Per primary  Gabapentin renally dosed  Hypertension: Normotensive this morning.  Per primary  Subjective:    Continues to complain about left leg pain. She is more awake today.     Objective:   BP (!) 157/92 (BP Location: Right Arm)   Pulse (!) 102   Temp 98.8 F (37.1 C) (Oral)   Resp 12   Ht 5\' 9"  (1.753 m)   Wt 135 lb (61.2 kg)   LMP 01/06/2017 (Approximate)   SpO2 98%   BMI 19.94 kg/m   Intake/Output Summary (Last 24 hours) at 02/07/17 1124 Last data filed at 02/07/17 1032  Gross per 24 hour  Intake          1573.33 ml  Output             3025 ml  Net         -1451.67 ml   Weight change:    Physical Exam: GEN: More awake today, no apparent distress CVS: RRR, no murmurs, 2+ DP pulses bilaterally RESP: CTAB, normal work of breathing GI: +BS, soft, non-tender, non-distended MSK: Dark gangrenous 3rd and 4th toes in the left foot,  erythematous skin in her left foot  Imaging: Ct Soft Tissue Neck Wo Contrast  Result Date: 02/05/2017 CLINICAL DATA:  Pain and swelling of the right side of the neck/face for 1 month. EXAM: CT NECK WITHOUT CONTRAST TECHNIQUE: Multidetector CT imaging of the neck was performed following the standard protocol without intravenous contrast. COMPARISON:  None. FINDINGS: Pharynx and larynx: No mucosal or submucosal lesion. Salivary glands: Parotid and submandibular glands are normal. Thyroid: Normal Lymph nodes: No enlarged or low-density nodes on either side of the neck. Slightly prominent right submandibular nodes. See below. Vascular: Normal without contrast Limited intracranial: Normal Visualized orbits: Normal Mastoids and visualized paranasal sinuses: Clear Skeleton: Dental decay and periodontal disease extensively. Upper chest: Emphysema without dominant bullous disease. Other: In the region of concern, just inferior to the right body of the mandible, there is an indistinct region of increased density within the subcutaneous fat measuring about 1.7 cm in diameter. This also involves the platysma muscle and shows some inflammation extending just deep to the platysma muscle within the submandibular space. This is presumed represent infectious inflammation. No sign of radiopaque foreign object. Adjacent submandibular nodes  are slightly prominent. IMPRESSION: 1.7 cm in diameter region of indistinct increased density in the right submandibular region with the epicenter in the subcutaneous fat but with involvement of the platysma muscle and mild extension into the submandibular space. This is most consistent with nonspecific infectious inflammation. Mild reactive  enlargement of the adjacent submandibular lymph nodes. No involvement of the submandibular gland itself. No evidence of radiopaque foreign object. Soft tissue neoplasm is not excluded, but seems much less likely than inflammatory disease. Dental decay and periodontal disease. Electronically Signed   By: Nelson Chimes M.D.   On: 02/05/2017 13:11    Labs: BMET  Recent Labs Lab 02/01/17 2343 02/02/17 0615 02/03/17 1423 02/03/17 2116 02/04/17 0429 02/05/17 0330 02/06/17 0324 02/07/17 0204  NA 134* 135  --  133* 132* 137 134* 138  K 3.5 3.6  --  4.1 3.6 4.1 3.9 4.8  CL 100* 99*  --  99* 102 102 101 102  CO2 28 28  --  25 22 25 25 28   GLUCOSE 319* 302*  --  370* 158* 72 218* 170*  BUN 10 11  --  19 22* 24* 22* 19  CREATININE 0.92 0.87 2.29* 3.10* 3.53* 4.29* 3.70* 3.49*  CALCIUM 8.4* 8.2*  --  8.4* 7.8* 8.5* 8.5* 9.3  PHOS  --   --   --   --   --   --   --  5.6*   CBC  Recent Labs Lab 02/01/17 2343 02/02/17 0615 02/03/17 1423 02/04/17 0429 02/05/17 0330 02/07/17 0204  WBC 10.7* 9.5 12.7* 14.0* 13.4* 10.0  NEUTROABS 5.7 4.5  --   --   --   --   HGB 12.5 12.0 12.4 10.7* 10.6* 10.2*  HCT 35.9* 34.7* 36.3 31.8* 31.5* 30.9*  MCV 85.9 86.1 88.1 88.3 89.0 90.6  PLT 236 209 174 186 198 227    Medications:    . aspirin EC  81 mg Oral Daily  . calcium carbonate  1 tablet Oral TID  . clopidogrel  75 mg Oral Q breakfast  . feeding supplement (ENSURE ENLIVE)  237 mL Oral BID BM  . gabapentin  400 mg Oral TID  . heparin  5,000 Units Subcutaneous Q8H  . insulin aspart  0-9 Units Subcutaneous TID WC  . insulin glargine  35 Units Subcutaneous BID  . living well with diabetes book   Does not apply Once  . lurasidone  120 mg Oral QHS  . metoprolol tartrate  12.5 mg Oral BID  . mometasone-formoterol  2 puff Inhalation BID  . multivitamin with minerals  1 tablet Oral Daily  . nicotine  21 mg Transdermal Daily  . OXcarbazepine  600 mg Oral BID  . oxyCODONE  10 mg Oral Q12H  .  pantoprazole  40 mg Oral Daily  . protein supplement shake  11 oz Oral BID BM  . simvastatin  40 mg Oral Daily  . sucralfate  1 g Oral TID WC & HS    Wendee Beavers, MD.  PGY-2 Pager [phone] 02/07/17  11:24 AM

## 2017-02-07 NOTE — Progress Notes (Signed)
Physical Therapy Treatment Patient Details Name: Kathy Phelps MRN: [id number] DOB: 11-04-1980 Today's Date: 02/07/2017    History of Present Illness 36 y.o. 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 PMH consists of uncontrolled DM, +smoker, and moderate malnutrition. Pt underwent LLE angioplasty 02-03-17.     PT Comments    Pt continues to progress towards goals, however, continues to be limited by pain in L foot/hip. Pt ambulating with min to mod A this session with RW. Pt reporting dizziness with head movement upon entry. Performed [employer] with pt reporting symptoms on the R. Performed epley maneuver, and pt reporting resolution of symptoms. Current plan remains appropriate. Will continue to follow to maximize functional mobility independence.   Follow Up Recommendations  SNF;Supervision/Assistance - 24 hour     Equipment Recommendations  Rolling walker with 5" wheels;Wheelchair cushion (measurements PT);Wheelchair (measurements PT)    Recommendations for Other Services       Precautions / Restrictions Precautions Precautions: Fall Precaution Comments: Pt reporting multiple falls including one which occured in the hospital. When asked about circumstances, pt reports she got up by herself. Educated about importance of calling for staff before getting up.  Restrictions Weight Bearing Restrictions: No    Mobility  Bed Mobility Overal bed mobility: Needs Assistance Bed Mobility: Sit to Supine     Supine to sit: Min assist     General bed mobility comments: Assist for trunk elevation during transfer.   Transfers Overall transfer level: Needs assistance Equipment used: Rolling walker (2 wheeled) Transfers: Sit to/from Stand Sit to Stand: Min assist         General transfer comment: Pt pulling up from walker; educated about importance of pressing up from surface to ensure safety with transfer. Educated about bearing weight through heel  to decrease pain.   Ambulation/Gait Ambulation/Gait assistance: Min assist;Mod assist Ambulation Distance (Feet): 150 Feet Assistive device: Rolling walker (2 wheeled) Gait Pattern/deviations: Step-to pattern;Decreased stance time - left;Decreased weight shift to left;Antalgic;Wide base of support;Trunk flexed Gait velocity: Decreased Gait velocity interpretation: Below normal speed for age/gender General Gait Details: Pt with flexed posture and decreased proximity to defice. Educated to stand tall and look up throughout gait and to maintain appropriate proximity to device. Pt reporting she gets dizzy when looking up; educated to focus on stationary object. Pt with increased shakiness towards end of gait when walking upright. Required forced standing rest break, and allowed pt to walk back in slouched posture; decreased shakiness with slouched posture. Verbal cues throughout to walk on heel.    Stairs            Wheelchair Mobility    Modified Rankin (Stroke Patients Only)       Balance Overall balance assessment: Needs assistance Sitting-balance support: Feet supported Sitting balance-Leahy Scale: Fair     Standing balance support: Bilateral upper extremity supported Standing balance-Leahy Scale: Poor Standing balance comment: Reliant on RW                             Cognition Arousal/Alertness: Awake/alert Behavior During Therapy: Restless;Impulsive Overall Cognitive Status: No family/caregiver present to determine baseline cognitive functioning Area of Impairment: Orientation;Memory;Safety/judgement;Problem solving                 Orientation Level: Disoriented to;Time   Memory: Decreased short-term memory;Decreased recall of precautions   Safety/Judgement: Decreased awareness of safety;Decreased awareness of deficits   Problem Solving:  Slow processing;Difficulty sequencing;Requires verbal cues General Comments: No family to determine baseline  functioning. Pt required extended time to remember birthdate, but was able to recite correctly.       Exercises      General Comments General comments (skin integrity, edema, etc.): Pt requiring continuous safety education to call for staff when getting up to prevent falls. Will need follow up safety education.       Pertinent Vitals/Pain Pain Assessment: 0-10 Pain Score: 10-Worst pain ever Pain Location: L foot, L hip  Pain Descriptors / Indicators: Aching;Burning Pain Intervention(s): Limited activity within patient's tolerance;Monitored during session;Repositioned    Home Living                      Prior Function            PT Goals (current goals can now be found in the care plan section) Acute Rehab PT Goals Patient Stated Goal: decrease pain PT Goal Formulation: With patient Time For Goal Achievement: 02/18/17 Potential to Achieve Goals: Fair Progress towards PT goals: Progressing toward goals    Frequency    Min 3X/week      PT Plan Current plan remains appropriate    Co-evaluation              AM-PAC PT "6 Clicks" Daily Activity  Outcome Measure  Difficulty turning over in bed (including adjusting bedclothes, sheets and blankets)?: A Little Difficulty moving from lying on back to sitting on the side of the bed? : Total Difficulty sitting down on and standing up from a chair with arms (e.g., wheelchair, bedside commode, etc,.)?: Total Help needed moving to and from a bed to chair (including a wheelchair)?: A Little Help needed walking in hospital room?: A Lot Help needed climbing 3-5 steps with a railing? : A Lot 6 Click Score: 12    End of Session Equipment Utilized During Treatment: Gait belt Activity Tolerance: Patient limited by pain Patient left: in chair;with call bell/phone within reach;with chair alarm set Nurse Communication: Mobility status PT Visit Diagnosis: Unsteadiness on feet (R26.81);Muscle weakness (generalized)  (M62.81);Pain Pain - Right/Left: Left Pain - part of body: Ankle and joints of foot     Time: 1040-1107 PT Time Calculation (min) (ACUTE ONLY): 27 min  Charges:  $Gait Training: 23-37 mins                    G Codes:       Nicky Pugh, PT, DPT  Acute Rehabilitation Services  Pager: [phone]    Army Melia 02/07/2017, 12:00 PM

## 2017-02-07 NOTE — Telephone Encounter (Signed)
Sched ABI 02/21/17 at 3:30, LE Art L 02/22/17 at 9:00, and MD 02/25/17 at 10:45.  No vm on pt's#, lm on emergency contact's #.

## 2017-02-07 NOTE — Telephone Encounter (Signed)
-----   Message from Mena Goes, RN sent at 02/04/2017  9:10 AM EDT ----- Regarding: 2-3 weeks w/ labs   ----- Message ----- From: Waynetta Sandy, MD Sent: 02/04/2017   8:34 AM To: [facility] Charge Pool  f/u in 2-3 weeks with abi and left leg duplex

## 2017-02-07 NOTE — Progress Notes (Addendum)
  Progress Note    02/07/2017 7:17 AM 4 Days Post-Op  Subjective:  Pt very angry upon walking in.  Says she wants theres toes off and wants them off now.  She says "I'm not leaving this hospital until they are off and if these other two toes turn black, I'm gonna sue y'all".   Afebrile HR  90's-100's 800'L-491'P systolic  91% 5AV6PV  Vitals:   02/06/17 2259 02/07/17 0341  BP: (!) 146/99 (!) 157/92  Pulse: 95 (!) 102  Resp:  12  Temp:  98.8 F (37.1 C)    Physical Exam: Cardiac:  regular Lungs:  Non laboared Extremities:  Left foot has palpable DP pulse; left foot is warm.  Gangrene of 3rd, 4th, and 5th toes   CBC    Component Value Date/Time   WBC 10.0 02/07/2017 0204   RBC 3.41 (L) 02/07/2017 0204   HGB 10.2 (L) 02/07/2017 0204   HCT 30.9 (L) 02/07/2017 0204   PLT 227 02/07/2017 0204   MCV 90.6 02/07/2017 0204   MCH 29.9 02/07/2017 0204   MCHC 33.0 02/07/2017 0204   RDW 12.7 02/07/2017 0204   LYMPHSABS 4.0 02/02/2017 0615   MONOABS 0.6 02/02/2017 0615   EOSABS 0.3 02/02/2017 0615   BASOSABS 0.0 02/02/2017 0615    BMET    Component Value Date/Time   NA 138 02/07/2017 0204   K 4.8 02/07/2017 0204   CL 102 02/07/2017 0204   CO2 28 02/07/2017 0204   GLUCOSE 170 (H) 02/07/2017 0204   BUN 19 02/07/2017 0204   CREATININE 3.49 (H) 02/07/2017 0204   CALCIUM 9.3 02/07/2017 0204   GFRNONAA 16 (L) 02/07/2017 0204   GFRAA 18 (L) 02/07/2017 0204    INR    Component Value Date/Time   INR 0.96 02/03/2010 0240     Intake/Output Summary (Last 24 hours) at 02/07/17 0717 Last data filed at 02/06/17 1730  Gross per 24 hour  Intake           733.33 ml  Output             1700 ml  Net          -966.67 ml     Assessment:  36 y.o. female is s/p:  Atherectomy of left popliteal artery, drug coated balloon angioplasty of left popliteal artery, and balloon angioplasty of left anterior tibial artery. The patient has a palpable dorsalis pedis pulse.   4 Days  Post-Op  Plan: -pt wants amputation of her toes.  Her creatinine is still elevated but down this morning.  Her K+ is 4.8. -DVT prophylaxis:  SQ heparin -Dr. Donzetta Matters to see this am.   Leontine Locket, PA-C [facility] [phone] 02/07/2017 7:17 AM  I have independently interviewed patient and agree with PA assessment and plan above. Her Cr continues to improve and she has palpable dp and pt on the left. I have asked her to take only po pain meds and we can discuss amputation tomorrow but it would possibly require 3 toes vs tma and with waiting could possibly just perform amputation of last 2 toes. She demonstrates good understanding.   Ofelia Podolski C. Donzetta Matters, MD [facility] [place] Office: [phone] Pager: [phone]

## 2017-02-08 LAB — CBC
HCT: 28.4 % — ABNORMAL LOW (ref 36.0–46.0)
HEMOGLOBIN: 9.3 g/dL — AB (ref 12.0–15.0)
MCH: 29.5 pg (ref 26.0–34.0)
MCHC: 32.7 g/dL (ref 30.0–36.0)
MCV: 90.2 fL (ref 78.0–100.0)
PLATELETS: 221 10*3/uL (ref 150–400)
RBC: 3.15 MIL/uL — AB (ref 3.87–5.11)
RDW: 12.6 % (ref 11.5–15.5)
WBC: 8.9 10*3/uL (ref 4.0–10.5)

## 2017-02-08 LAB — COMPREHENSIVE METABOLIC PANEL
ALT: 10 U/L — AB (ref 14–54)
ANION GAP: 7 (ref 5–15)
AST: 16 U/L (ref 15–41)
Albumin: 2 g/dL — ABNORMAL LOW (ref 3.5–5.0)
Alkaline Phosphatase: 120 U/L (ref 38–126)
BUN: 16 mg/dL (ref 6–20)
CALCIUM: 8.7 mg/dL — AB (ref 8.9–10.3)
CHLORIDE: 105 mmol/L (ref 101–111)
CO2: 24 mmol/L (ref 22–32)
CREATININE: 3.03 mg/dL — AB (ref 0.44–1.00)
GFR calc Af Amer: 22 mL/min — ABNORMAL LOW (ref >60)
GFR, EST NON AFRICAN AMERICAN: 19 mL/min — AB (ref >60)
Glucose, Bld: 79 mg/dL (ref 65–99)
Potassium: 4.1 mmol/L (ref 3.5–5.1)
SODIUM: 136 mmol/L (ref 135–145)
Total Bilirubin: 0.5 mg/dL (ref 0.3–1.2)
Total Protein: 6 g/dL — ABNORMAL LOW (ref 6.5–8.1)

## 2017-02-08 LAB — GLUCOSE, CAPILLARY
GLUCOSE-CAPILLARY: 62 mg/dL — AB (ref 65–99)
GLUCOSE-CAPILLARY: 162 mg/dL — AB (ref 65–99)
GLUCOSE-CAPILLARY: 109 mg/dL — AB (ref 65–99)
GLUCOSE-CAPILLARY: 68 mg/dL (ref 65–99)
Glucose-Capillary: 58 mg/dL — ABNORMAL LOW (ref 65–99)
Glucose-Capillary: 140 mg/dL — ABNORMAL HIGH (ref 65–99)
Glucose-Capillary: 76 mg/dL (ref 65–99)
Glucose-Capillary: 70 mg/dL (ref 65–99)

## 2017-02-08 MED ORDER — INSULIN GLARGINE 100 UNIT/ML ~~LOC~~ SOLN
28.0000 [IU] | Freq: Two times a day (BID) | SUBCUTANEOUS | Status: DC
  Filled 2017-02-08: qty 0.28

## 2017-02-08 MED ORDER — DEXTROSE 50 % IV SOLN
INTRAVENOUS | Status: AC
  Administered 2017-02-08: 25 mL
  Filled 2017-02-08: qty 50

## 2017-02-08 MED ORDER — PIPERACILLIN-TAZOBACTAM 3.375 G IVPB
3.3750 g | Freq: Three times a day (TID) | INTRAVENOUS | Status: DC
  Administered 2017-02-08 – 2017-02-11 (×10): 3.375 g via INTRAVENOUS
  Filled 2017-02-08 (×12): qty 50

## 2017-02-08 MED ORDER — INSULIN GLARGINE 100 UNIT/ML ~~LOC~~ SOLN
25.0000 [IU] | Freq: Two times a day (BID) | SUBCUTANEOUS | Status: DC
  Filled 2017-02-08 (×2): qty 0.25

## 2017-02-08 MED ORDER — INSULIN GLARGINE 100 UNIT/ML ~~LOC~~ SOLN: 30.0000 [IU] | Freq: Two times a day (BID) | SUBCUTANEOUS | Status: DC

## 2017-02-08 MED ORDER — POLYETHYLENE GLYCOL 3350 17 G PO PACK
17.0000 g | PACK | Freq: Two times a day (BID) | ORAL | Status: DC
  Administered 2017-02-09 – 2017-02-11 (×2): 17 g via ORAL
  Filled 2017-02-08 (×4): qty 1

## 2017-02-08 MED ORDER — GLUCERNA SHAKE PO LIQD: 237.0000 mL | Freq: Three times a day (TID) | ORAL | Status: DC

## 2017-02-08 NOTE — Progress Notes (Addendum)
Nutrition Follow Up  DOCUMENTATION CODES:   Non-severe (moderate) malnutrition in context of chronic illness  INTERVENTION:    Glucerna Shake po TID, each supplement provides 220 kcal and 10 grams of protein   Continue MVI with minerals daily  NUTRITION DIAGNOSIS:   Malnutrition (moderate) related to chronic illness, uncontrolled diabetes as evidenced by moderate depletions of muscle mass, moderate depletion of body fat, ongoing  GOAL:   Patient will meet greater than or equal to 90% of their needs, progressing  MONITOR:   PO intake, Supplement acceptance, Labs, Weight trends  ASSESSMENT:    36 y.o. female with history of diabetes mellitus presents to the ER with complaints of increasing pain and discoloration of left foot lateral 3 toes. Left foot cellulitis with possible osteomyelitis and peripheral vascular disease   Pt s/p several vascular procedures 4/26 for critical L lower extremity ischemia. Vascular Surgery note reviewed 5/1.  Likely for necrotic toe amputations Thurs, 5/3. Appetite fair.  PO intake variable at 15-75% per flowsheet records.  Will change nutrition supplement to Glucerna Shake to help minimize blood glucose response.  Labs and medications reviewed. CBG's [id number].  Diet Order:  Diet Heart Room service appropriate? Yes; Fluid consistency: Thin  Skin:  Wound (see comment) (diabetic foot ulcers)  Last BM:  4/27  Height:   Ht Readings from Last 1 Encounters:  02/01/17 5\' 9"  (1.753 m)   Weight:   Wt Readings from Last 1 Encounters:  02/02/17 135 lb (61.2 kg)   Ideal Body Weight:  65.9 kg  BMI:  Body mass index is 19.94 kg/m.  Estimated Nutritional Needs:   Kcal:  1800-2100kcal/day   Protein:  80-92g/day   Fluid:  >1.8L/day   EDUCATION NEEDS:   No education needs identified at this time  Arthur Holms, RD, LDN Pager #: [phone] After-Hours Pager #: [phone]

## 2017-02-08 NOTE — Progress Notes (Signed)
Hypoglycemic Event  CBG: 62  Treatment: 15 GM carbohydrate snack  Symptoms: None  Follow-up CBG: Time:0625 CBG Result:58  Possible Reasons for Event: Unknown  CBG: 58   Treatment: D50 IV 25 mL  Symptoms: None  Follow-up CBG: HLKT:6256 CBG Result:140  Possible Reasons for Event: Unknown  Comments: Pt refused to take any more snacks PO    [name]   Remember to initiate Hypoglycemia Order Set & complete

## 2017-02-08 NOTE — Progress Notes (Signed)
Inpatient Diabetes Program Recommendations  AACE/ADA: New Consensus Statement on Inpatient Glycemic Control (2015)  Target Ranges:  Prepandial:   less than 140 mg/dL      Peak postprandial:   less than 180 mg/dL (1-2 hours)      Critically ill patients:  140 - 180 mg/dL  Results for Kathy Phelps, Kathy Phelps (MRN [id number]) as of 02/08/2017 07:56  Ref. Range 02/07/2017 06:28 02/07/2017 11:28 02/07/2017 17:21 02/07/2017 20:40 02/08/2017 05:59 02/08/2017 06:25 02/08/2017 06:45  Glucose-Capillary Latest Ref Range: 65 - 99 mg/dL 111 (H) 90 264 (H) 198 (H) 62 (L) 58 (L) 140 (H)    Review of Glycemic Control  Diabetes history: DM Outpatient Diabetes medications: Lantus 60 units BID, Metformin 1500 mg BID, Novloog 10-30 units per correction scale when needed Current orders for Inpatient glycemic control: Lantus 35 units BID, Novolog 0-9 units TID with meals  Inpatient Diabetes Program Recommendations:  Insulin - Basal: In reviewing chart, noted Lantus 35 units at bedtime on 02/07/17 (NO morning dose given on 4/30) and fasting glucose 62 mg/dl this morning. Please consider decreasing Lantus to 30 units QHS. HgbA1C: A1C 11.8% on 02/02/17 indicating an average glucose of 292 mg/dl over the past 2-3 months.  Thanks, Barnie Alderman, RN, MSN, CDE Diabetes Coordinator Inpatient Diabetes Program [phone] (Team Pager from 8am to 5pm)

## 2017-02-08 NOTE — Progress Notes (Signed)
Admit: 02/01/2017 LOS: 6  50F AKI (nl Baseline SCr) with diabetic foot infection req amputations and neurogenic bladder  Subjective:  Likely for further amputation 5/3 SCr improving, electrolytes stable, good UOP    04/30 0701 - 05/01 0700 In: 240 [P.O.:240] Out: 2700 [Urine:2700]  Filed Weights   02/02/17 0242  Weight: 61.2 kg (135 lb)    Scheduled Meds: . calcium carbonate  1 tablet Oral TID  . clopidogrel  75 mg Oral Q breakfast  . feeding supplement (ENSURE ENLIVE)  237 mL Oral BID BM  . gabapentin  400 mg Oral TID  . heparin  5,000 Units Subcutaneous Q8H  . insulin aspart  0-9 Units Subcutaneous TID WC  . insulin glargine  25 Units Subcutaneous BID  . living well with diabetes book   Does not apply Once  . lurasidone  120 mg Oral QHS  . metoprolol tartrate  12.5 mg Oral BID  . mometasone-formoterol  2 puff Inhalation BID  . multivitamin with minerals  1 tablet Oral Daily  . nicotine  21 mg Transdermal Daily  . OXcarbazepine  600 mg Oral BID  . oxyCODONE  10 mg Oral Q12H  . pantoprazole  40 mg Oral Daily  . [START ON 02/09/2017] polyethylene glycol  17 g Oral BID  . protein supplement shake  11 oz Oral BID BM  . simvastatin  40 mg Oral Daily  . sucralfate  1 g Oral TID WC & HS   Continuous Infusions: . sodium chloride 100 mL/hr at 02/08/17 0150  . methocarbamol (ROBAXIN)  IV 500 mg (02/04/17 1441)  . piperacillin-tazobactam (ZOSYN)  IV     PRN Meds:.albuterol, ALPRAZolam, alum & mag hydroxide-simeth, diphenhydrAMINE **OR** diphenhydrAMINE, fentaNYL (SUBLIMAZE) injection, hydrALAZINE, LORazepam, methocarbamol (ROBAXIN)  IV, naloxone **AND** sodium chloride flush, ondansetron **OR** ondansetron (ZOFRAN) IV, oxyCODONE  Current Labs: reviewed    Physical Exam:  Blood pressure (!) 141/92, pulse 85, temperature 97.9 F (36.6 C), temperature source Oral, resp. rate 16, height 5\' 9"  (1.753 m), weight 61.2 kg (135 lb), last menstrual period 01/06/2017, SpO2 98  %. NAD RRR No LEE Dusky cyanotic L foot s/nt/nd  A 1. AKI, recovering, adequat UOP; likely 2/2 infection + NSAID + IV contrast + urinary retention 2. PAD with gangrenous toes / foot cellulitis, req further amputation 3. Urinary retention with indwelling foley 4. DM2 5. HTN  P 1. Hold IVFs at this point 2. Should cont to recover GFR.  Will sign off for now. No outpt nephrology f/u necessary if contineus to improve.     Pearson Grippe MD 02/08/2017, 10:49 AM   Recent Labs Lab 02/06/17 0324 02/07/17 0204 02/08/17 0211  NA 134* 138 136  K 3.9 4.8 4.1  CL 101 102 105  CO2 25 28 24   GLUCOSE 218* 170* 79  BUN 22* 19 16  CREATININE 3.70* 3.49* 3.03*  CALCIUM 8.5* 9.3 8.7*  PHOS  --  5.6*  --     Recent Labs Lab 02/01/17 2343 02/02/17 0615  02/05/17 0330 02/07/17 0204 02/08/17 0211  WBC 10.7* 9.5  < > 13.4* 10.0 8.9  NEUTROABS 5.7 4.5  --   --   --   --   HGB 12.5 12.0  < > 10.6* 10.2* 9.3*  HCT 35.9* 34.7*  < > 31.5* 30.9* 28.4*  MCV 85.9 86.1  < > 89.0 90.6 90.2  PLT 236 209  < > 198 227 221  < > = values in this interval not displayed.

## 2017-02-08 NOTE — Progress Notes (Signed)
Pharmacy Antibiotic Note  Kathy Phelps is a 36 y.o. female on day #7 abx for cellulitis L foot w/possible gangrene. MRI L foot neg for osteo. S/p aortogram by [facility] on 4/26 - got 150 ml contrast, also got Toradol x 3 doses. AKI - while initially on Q 12 hr dosing. Pending renal function recovery for amputation. No wound cx, MRSA PCR neg, Discussed with Dr. Allyson Sabal - d/c vanc [employer], WBC 8.9, scr trending down, 3.03 today, est. crcl ~ 25 ml/min, UOP good 2.4 ml/kg/hr  Plan: D/C vancomycin Change zosyn to 3.375g IV Q 8 hrs (4 hr infusion)  Height: 5\' 9"  (175.3 cm) Weight: 135 lb (61.2 kg) IBW/kg (Calculated) : 66.2  Temp (24hrs), Avg:98.4 F (36.9 C), Min:97.9 F (36.6 C), Max:98.9 F (37.2 C)   Recent Labs Lab 02/02/17 1404 02/02/17 1552 02/03/17 1423  02/04/17 0429 02/05/17 0330 02/06/17 0324 02/07/17 0204 02/08/17 0211  WBC  --   --  12.7*  --  14.0* 13.4*  --  10.0 8.9  CREATININE  --   --  2.29*  < > 3.53* 4.29* 3.70* 3.49* 3.03*  LATICACIDVEN 1.5 1.2  --   --   --   --   --   --   --   VANCORANDOM  --   --   --   < >  --  16 11  --   --   < > = values in this interval not displayed.  Estimated Creatinine Clearance: 24.6 mL/min (A) (by C-G formula based on SCr of 3.03 mg/dL (H)).    Allergies  Allergen Reactions  . Hydrocodone-Acetaminophen Hives  . Tylenol [Acetaminophen] Itching and Swelling    Antimicrobials this admission:  Vanc 4/25 >> 5/1  Zosyn 4/24 at 11:47pm >>  Dose adjustments this admission: 4/26 Vanc level = 26 mcg/ml 24hrs after prior dose, but was on Vanc 1gm IV q12h, hold dose 4/28 Vanc level = 16 mcg/ml, continue to hold 4/29 Vanc level = 11 mcg/ml, give 500mg  IV x 1   Microbiology results:  4/26 MRSA PCR negative  Thank you for allowing pharmacy to be a part of this patient's care.  Manley Mason, RPh, BCPS 02/08/2017 10:06 AM

## 2017-02-08 NOTE — Progress Notes (Addendum)
Occupational Therapy Treatment Patient Details Name: Kathy Phelps MRN: [id number] DOB: 10-Mar-1981 Today's Date: 02/08/2017    History of present illness 36 y.o. 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 PMH consists of uncontrolled DM, +smoker, and moderate malnutrition, Bipolar, depression, renal disorder, schizophrenia  Pt underwent LLE angioplasty 02-03-17.    OT comments  Attempted to see this am, but pt declined. Pt agreed to ambulate to bathroom with min A @ RW level and mod A for clothing//hygiene management. Pt declined to ambulate further or to sit up in recliner. Will continue to follow acutely and recommend SNF for DC.  Encourage nsg to ambulate pt to bathroom with min A @ RW level   Follow Up Recommendations  SNF;Supervision/Assistance - 24 hour    Equipment Recommendations  Other (comment) (TBA)    Recommendations for Other Services      Precautions / Restrictions Precautions Precautions: Fall Precaution Comments: Pt reporting multiple falls including one which occured in the hospital. When asked about circumstances, pt reports she got up by herself. Educated about importance of calling for staff before getting up.  Restrictions Weight Bearing Restrictions: No       Mobility Bed Mobility Overal bed mobility: Modified Independent                Transfers Overall transfer level: Needs assistance Equipment used: Rolling walker (2 wheeled) Transfers: Sit to/from Stand Sit to Stand: Min assist         General transfer comment: Easily loses balance.     Balance Overall balance assessment: Needs assistance   Sitting balance-Leahy Scale: Fair       Standing balance-Leahy Scale: Poor                             ADL either performed or assessed with clinical judgement   ADL Overall ADL's : Needs assistance/impaired Eating/Feeding: Independent   Grooming: Set up;Sitting   Upper Body Bathing: Set  up;Supervision/ safety;Sitting   Lower Body Bathing: Moderate assistance;Sit to/from stand       Lower Body Dressing: Moderate assistance;Sit to/from stand Lower Body Dressing Details (indicate cue type and reason): Able to donn underwear over feet adn pull up with min A for balance Toilet Transfer: Minimal assistance;RW;BSC;Ambulation (BSC over toilet)   Toileting- Clothing Manipulation and Hygiene: Minimal assistance;Sit to/from stand       Functional mobility during ADLs: Minimal assistance;Rolling walker;Cueing for safety       Vision   Additional Comments: poor vision form "diabetes". "can't see the TV"   Perception     Praxis      Cognition Arousal/Alertness: Awake/alert Behavior During Therapy: Flat affect;Impulsive;Restless Overall Cognitive Status: Within Functional Limits for tasks assessed Area of Impairment: Orientation;Memory;Safety/judgement;Problem solving                 Orientation Level: Disoriented to;Time   Memory: Decreased recall of precautions;Decreased short-term memory   Safety/Judgement: Decreased awareness of safety;Decreased awareness of deficits   Problem Solving: Slow processing General Comments: most likely baseline; hx of schitzophrenia        Exercises     Shoulder Instructions       General Comments      Pertinent Vitals/ Pain       Pain Assessment: Faces Faces Pain Scale: Hurts even more Pain Location: L foot, L hip  Pain Descriptors / Indicators: Aching;Burning Pain Intervention(s): Limited activity within patient's tolerance  Home Living                                          Prior Functioning/Environment              Frequency  Min 2X/week        Progress Toward Goals  OT Goals(current goals can now be found in the care plan section)  Progress towards OT goals: Progressing toward goals  Acute Rehab OT Goals Patient Stated Goal: decrease pain OT Goal Formulation: With  patient Time For Goal Achievement: 02/18/17 Potential to Achieve Goals: Good ADL Goals Pt Will Perform Grooming: with set-up;with supervision;sitting Pt Will Perform Upper Body Bathing: with set-up;with supervision;sitting Pt Will Perform Lower Body Bathing: with min guard assist;sit to/from stand Pt Will Transfer to Toilet: with min guard assist;ambulating;bedside commode Pt Will Perform Toileting - Clothing Manipulation and hygiene: with min guard assist;sit to/from stand  Plan Discharge plan remains appropriate    Co-evaluation                 AM-PAC PT "6 Clicks" Daily Activity     Outcome Measure   Help from another person eating meals?: None Help from another person taking care of personal grooming?: A Little Help from another person toileting, which includes using toliet, bedpan, or urinal?: A Lot Help from another person bathing (including washing, rinsing, drying)?: A Lot Help from another person to put on and taking off regular upper body clothing?: A Little Help from another person to put on and taking off regular lower body clothing?: A Little 6 Click Score: 17    End of Session Equipment Utilized During Treatment: Gait belt;Rolling walker  OT Visit Diagnosis: Unsteadiness on feet (R26.81);Other abnormalities of gait and mobility (R26.89);Repeated falls (R29.6);Muscle weakness (generalized) (M62.81)   Activity Tolerance Patient tolerated treatment well   Patient Left in bed;with call bell/phone within reach;with bed alarm set   Nurse Communication Mobility status        Time: 1420-1447 OT Time Calculation (min): 27 min  Charges: OT General Charges $OT Visit: 1 Procedure OT Treatments $Self Care/Home Management : 23-37 mins  [facility], OT/L  [phone] 02/08/2017   Jaquilla Woodroof,HILLARY 02/08/2017, 3:30 PM

## 2017-02-08 NOTE — Progress Notes (Signed)
  Progress Note    02/08/2017 10:13 AM 5 Days Post-Op  Subjective:  Having left foot pain  Vitals:   02/08/17 0829 02/08/17 0917  BP:  (!) 141/92  Pulse:  85  Resp: 16   Temp:      Physical Exam: Palpable left dp/pt Erythema on to dorsum of foot with necrosis of left 3rd,4th,5th toes  CBC    Component Value Date/Time   WBC 8.9 02/08/2017 0211   RBC 3.15 (L) 02/08/2017 0211   HGB 9.3 (L) 02/08/2017 0211   HCT 28.4 (L) 02/08/2017 0211   PLT 221 02/08/2017 0211   MCV 90.2 02/08/2017 0211   MCH 29.5 02/08/2017 0211   MCHC 32.7 02/08/2017 0211   RDW 12.6 02/08/2017 0211   LYMPHSABS 4.0 02/02/2017 0615   MONOABS 0.6 02/02/2017 0615   EOSABS 0.3 02/02/2017 0615   BASOSABS 0.0 02/02/2017 0615    BMET    Component Value Date/Time   NA 136 02/08/2017 0211   K 4.1 02/08/2017 0211   CL 105 02/08/2017 0211   CO2 24 02/08/2017 0211   GLUCOSE 79 02/08/2017 0211   BUN 16 02/08/2017 0211   CREATININE 3.03 (H) 02/08/2017 0211   CALCIUM 8.7 (L) 02/08/2017 0211   GFRNONAA 19 (L) 02/08/2017 0211   GFRAA 22 (L) 02/08/2017 0211    INR    Component Value Date/Time   INR 0.96 02/03/2010 0240     Intake/Output Summary (Last 24 hours) at 02/08/17 1013 Last data filed at 02/08/17 0920  Gross per 24 hour  Intake              240 ml  Output             3550 ml  Net            -3310 ml     Assessment:  36 y.o. female is here with gangrene of left toes with associated erythema and swelling  Plan: Discussed with patient likely need for amputation for pain control and will need to be open amputation of toes with possible tma. Will plan for this Thursday. Continue plavix and antibiotics.    Ladonne Sharples C. Donzetta Matters, MD [facility] [place] Office: [phone] Pager: [phone]  02/08/2017 10:13 AM

## 2017-02-08 NOTE — Progress Notes (Signed)
PROGRESS NOTE    Kathy Phelps  DTO:[id number] DOB: Aug 07, 1981 DOA: 02/01/2017 PCP: Rubbie Battiest, NP     Brief Narrative:  36 yo female who presented to the hospital with the chief complain of left foot pain. Worsening symptoms for the last 4 weeks, refractive to outpatient oral antibiotic therapy. Initial evaluation patient found tachycardic. Left foot with significant erythema. Admitted for cellulitis and critical left lower extremity ischemia   Assessment & Plan:   Principal Problem:   Cellulitis of left foot Active Problems:   Uncontrolled type 2 diabetes mellitus with hyperglycemia (HCC)   HLD (hyperlipidemia)   Tobacco abuse   Essential hypertension   Malnutrition of moderate degree   AKI (acute kidney injury) ([place])   Cellulitis    Acute kidney injury, creatinine went from 0.87>3.53>4.29>3.7>3.49>3.03-now improving Urine output  3525  cc last 24 hrs  multi-factorial with ischemic ATN, contrast nephropathy and possible papillary necrosis from NSAIDs.Urinary retention due to neurogenic bladder is likely contributing, Renal US with mildly increased cortical echogenicity of both kidneys and mild right hydronephrosis Blood pressure soft post procedure 4/26 , now stable  Continue IV fluids, very likely in the recovery phase of ATN with a significant diuresis.     Left foot cellulitis with possible gangrene.   Holding vancomycin given acute kidney injury and continuing zosyn IV,     Left ABI indicated moderate peripheral arterial disease, status post aortogram/balloon angioplasty by vascular 4/26. .  Buddy Duty on fentanyl PCA by Dr. Scot Dock for uncontrolled pain. Fentanyl PCA discontinued/29 due to somnolent and respiratory depression, requiring Narcan Patient has now been transitioned to low dose OxyContin, oxycodone, when necessary iv Dilaudid. Minimize IV narcotics Dr. Donzetta Matters will make a decision about the timing of amputation   T2DM. Uncontrolled-noted to be  hypoglycemic in the setting of recent change in renal function, resumed Lantus at a lower dose  hemoglobin A1c  11.8. Now requiring periodic adjustments in insulin dosing Dose of gabapentin reduced secondary to acute kidney injury Decrease dose of Lantus to 25 units twice a day and monitor  HTN. Continue blood pressure control with metoprolol with hold parameters.   Tobacco abuse,on  nicotine patch  Dyslipidemia. Continue statin therapy with simvastatin.   Bipolar disorder/depression/anxiety/schizophrenia. Lot of psychosocial stressors,  Continue trileptal. latuda and alprazolam. Adjust dose for renal function   Right submandibular mass.   soft tissue ultrasound showed ill-defined hypoechoic mass in the submandibular region measuring 2.5 x 1.3 x 2.0 cm. CT head and neck -1.7 cm right submandibular mass, possibly infectious, neoplasm cannot be ruled out, will need outpatient oral surgery evaluation  Urinary retention 2/2 to neurogenic bladder with uncontrolled diabetes. Renal US with mild right hydronephrosis. Patient states that previously Flomax has not worked, continue indwelling Foley. Will need urology follow-up         DVT prophylaxis: scd  Code Status: full  Family Communication: discussed with daughter and husband in the room Disposition Plan:  As per vascular surgery,surgery on Thursday this week   Consultants:   Vascular  Nephrology  Procedures:   Aortogram 4/26   Antimicrobials:   Vancomycin  Zosyn     Subjective: Refuses meds for treating constipation  Objective: Vitals:   02/07/17 2049 02/08/17 0404 02/08/17 0829 02/08/17 0917  BP:  (!) 145/82  (!) 141/92  Pulse:  98  85  Resp:  18 16   Temp:  97.9 F (36.6 C)    TempSrc:  Oral    SpO2: 97%  98%    Weight:      Height:        Intake/Output Summary (Last 24 hours) at 02/08/17 0931 Last data filed at 02/08/17 0920  Gross per 24 hour  Intake              240 ml  Output             3550 ml  Net             -3310 ml   Filed Weights   02/02/17 0242  Weight: 61.2 kg (135 lb)    Examination:  General exam: deconditioned and ill looking appearing E ENT: mild pallor, no icterus.  Respiratory system: Clear to auscultation. Respiratory effort normal. Cardiovascular system: S1 & S2 heard, RRR. No JVD, murmurs, rubs, gallops or clicks. No pedal edema. Gastrointestinal system: Abdomen is nondistended, soft and nontender. No organomegaly or masses felt. Normal bowel sounds heard. Central nervous system: Alert and oriented. No focal neurological deficits. Extremities: Symmetric 5 x 5 power. Skin: Left foot with 3th and 4th toe discoloration, cyanotic with decrease local temperature, pulses are palpable. Noted for foot erythema, expanding to the leg, with ill defined margins.  .     Data Reviewed: I have personally reviewed following labs and imaging studies  CBC:  Recent Labs Lab 02/01/17 2343 02/02/17 0615 02/03/17 1423 02/04/17 0429 02/05/17 0330 02/07/17 0204 02/08/17 0211  WBC 10.7* 9.5 12.7* 14.0* 13.4* 10.0 8.9  NEUTROABS 5.7 4.5  --   --   --   --   --   HGB 12.5 12.0 12.4 10.7* 10.6* 10.2* 9.3*  HCT 35.9* 34.7* 36.3 31.8* 31.5* 30.9* 28.4*  MCV 85.9 86.1 88.1 88.3 89.0 90.6 90.2  PLT 236 209 174 186 198 227 751   Basic Metabolic Panel:  Recent Labs Lab 02/04/17 0429 02/05/17 0330 02/06/17 0324 02/07/17 0204 02/08/17 0211  NA 132* 137 134* 138 136  K 3.6 4.1 3.9 4.8 4.1  CL 102 102 101 102 105  CO2 22 25 25 28 24   GLUCOSE 158* 72 218* 170* 79  BUN 22* 24* 22* 19 16  CREATININE 3.53* 4.29* 3.70* 3.49* 3.03*  CALCIUM 7.8* 8.5* 8.5* 9.3 8.7*  PHOS  --   --   --  5.6*  --    GFR: Estimated Creatinine Clearance: 24.6 mL/min (A) (by C-G formula based on SCr of 3.03 mg/dL (H)). Liver Function Tests:  Recent Labs Lab 02/02/17 0615 02/04/17 0429 02/05/17 0330 02/07/17 0204 02/08/17 0211  AST 40 42* 37  --  16  ALT 10* 13* 14  --  10*  ALKPHOS 63 101  132*  --  120  BILITOT 0.3 0.7 0.8  --  0.5  PROT 6.1* 5.2* 5.7*  --  6.0*  ALBUMIN 2.6* 2.0* 2.0* 2.2* 2.0*   No results for input(s): LIPASE, AMYLASE in the last 168 hours. No results for input(s): AMMONIA in the last 168 hours. Coagulation Profile: No results for input(s): INR, PROTIME in the last 168 hours. Cardiac Enzymes:  Recent Labs Lab 02/04/17 0429  CKTOTAL <5*   BNP (last 3 results) No results for input(s): PROBNP in the last 8760 hours. HbA1C: No results for input(s): HGBA1C in the last 72 hours. CBG:  Recent Labs Lab 02/07/17 1721 02/07/17 2040 02/08/17 0559 02/08/17 0625 02/08/17 0645  GLUCAP 264* 198* 62* 58* 140*   Lipid Profile: No results for input(s): CHOL, HDL, LDLCALC, TRIG, CHOLHDL, LDLDIRECT in the last 72  hours. Thyroid Function Tests: No results for input(s): TSH, T4TOTAL, FREET4, T3FREE, THYROIDAB in the last 72 hours. Anemia Panel: No results for input(s): VITAMINB12, FOLATE, FERRITIN, TIBC, IRON, RETICCTPCT in the last 72 hours. Sepsis Labs:  Recent Labs Lab 02/02/17 1404 02/02/17 1552  LATICACIDVEN 1.5 1.2    Recent Results (from the past 240 hour(s))  MRSA PCR Screening     Status: None   Collection Time: 02/03/17 12:25 AM  Result Value Ref Range Status   MRSA by PCR NEGATIVE NEGATIVE Final    Comment:        The GeneXpert MRSA Assay (FDA approved for NASAL specimens only), is one component of a comprehensive MRSA colonization surveillance program. It is not intended to diagnose MRSA infection nor to guide or monitor treatment for MRSA infections.          Radiology Studies: No results found.      Scheduled Meds: . calcium carbonate  1 tablet Oral TID  . clopidogrel  75 mg Oral Q breakfast  . feeding supplement (ENSURE ENLIVE)  237 mL Oral BID BM  . gabapentin  400 mg Oral TID  . heparin  5,000 Units Subcutaneous Q8H  . insulin aspart  0-9 Units Subcutaneous TID WC  . insulin glargine  28 Units  Subcutaneous BID  . living well with diabetes book   Does not apply Once  . lurasidone  120 mg Oral QHS  . metoprolol tartrate  12.5 mg Oral BID  . mometasone-formoterol  2 puff Inhalation BID  . multivitamin with minerals  1 tablet Oral Daily  . nicotine  21 mg Transdermal Daily  . OXcarbazepine  600 mg Oral BID  . oxyCODONE  10 mg Oral Q12H  . pantoprazole  40 mg Oral Daily  . protein supplement shake  11 oz Oral BID BM  . simvastatin  40 mg Oral Daily  . sucralfate  1 g Oral TID WC & HS   Continuous Infusions: . sodium chloride 100 mL/hr at 02/08/17 0150  . methocarbamol (ROBAXIN)  IV 500 mg (02/04/17 1441)  . piperacillin-tazobactam (ZOSYN)  IV Stopped (02/08/17 0801)     LOS: 6 days      Reyne Dumas, MD [facility] Pager [phone]  If 7PM-7AM, please contact night-coverage [email or web address] Password [email or web address] 02/08/2017, 9:31 AM

## 2017-02-09 LAB — GLUCOSE, CAPILLARY
GLUCOSE-CAPILLARY: 70 mg/dL (ref 65–99)
GLUCOSE-CAPILLARY: 131 mg/dL — AB (ref 65–99)
GLUCOSE-CAPILLARY: 168 mg/dL — AB (ref 65–99)
Glucose-Capillary: 121 mg/dL — ABNORMAL HIGH (ref 65–99)
Glucose-Capillary: 179 mg/dL — ABNORMAL HIGH (ref 65–99)

## 2017-02-09 LAB — BASIC METABOLIC PANEL
Anion gap: 10 (ref 5–15)
BUN: 17 mg/dL (ref 6–20)
CO2: 23 mmol/L (ref 22–32)
CREATININE: 2.83 mg/dL — AB (ref 0.44–1.00)
Calcium: 8.8 mg/dL — ABNORMAL LOW (ref 8.9–10.3)
Chloride: 104 mmol/L (ref 101–111)
GFR calc Af Amer: 23 mL/min — ABNORMAL LOW (ref >60)
GFR, EST NON AFRICAN AMERICAN: 20 mL/min — AB (ref >60)
Glucose, Bld: 74 mg/dL (ref 65–99)
Potassium: 4.8 mmol/L (ref 3.5–5.1)
SODIUM: 137 mmol/L (ref 135–145)

## 2017-02-09 LAB — CBC
HCT: 30 % — ABNORMAL LOW (ref 36.0–46.0)
Hemoglobin: 10.1 g/dL — ABNORMAL LOW (ref 12.0–15.0)
MCH: 30.2 pg (ref 26.0–34.0)
MCHC: 33.7 g/dL (ref 30.0–36.0)
MCV: 89.8 fL (ref 78.0–100.0)
PLATELETS: 237 10*3/uL (ref 150–400)
RBC: 3.34 MIL/uL — ABNORMAL LOW (ref 3.87–5.11)
RDW: 12.8 % (ref 11.5–15.5)
WBC: 8.9 10*3/uL (ref 4.0–10.5)

## 2017-02-09 MED ORDER — INSULIN GLARGINE 100 UNIT/ML ~~LOC~~ SOLN: 15.0000 [IU] | Freq: Two times a day (BID) | SUBCUTANEOUS | Status: DC

## 2017-02-09 MED ORDER — INSULIN GLARGINE 100 UNIT/ML ~~LOC~~ SOLN
10.0000 [IU] | Freq: Two times a day (BID) | SUBCUTANEOUS | Status: DC
  Administered 2017-02-10 – 2017-02-11 (×2): 10 [IU] via SUBCUTANEOUS
  Filled 2017-02-09 (×6): qty 0.1

## 2017-02-09 NOTE — Progress Notes (Signed)
  Progress Note    02/09/2017 9:43 AM 6 Days Post-Op  Subjective:  No acute issues  Vitals:   02/09/17 0416 02/09/17 0930  BP: 134/82 (!) 143/126  Pulse: 81 90  Resp: 16   Temp: 97.9 F (36.6 C)     Physical Exam: aaox3 Non labored respirations Stable left foot dry gangrene of toes 3-5 Erythema and edema improving left foot Palpable left dp  CBC    Component Value Date/Time   WBC 8.9 02/09/2017 0417   RBC 3.34 (L) 02/09/2017 0417   HGB 10.1 (L) 02/09/2017 0417   HCT 30.0 (L) 02/09/2017 0417   PLT 237 02/09/2017 0417   MCV 89.8 02/09/2017 0417   MCH 30.2 02/09/2017 0417   MCHC 33.7 02/09/2017 0417   RDW 12.8 02/09/2017 0417   LYMPHSABS 4.0 02/02/2017 0615   MONOABS 0.6 02/02/2017 0615   EOSABS 0.3 02/02/2017 0615   BASOSABS 0.0 02/02/2017 0615    BMET    Component Value Date/Time   NA 137 02/09/2017 0417   K 4.8 02/09/2017 0417   CL 104 02/09/2017 0417   CO2 23 02/09/2017 0417   GLUCOSE 74 02/09/2017 0417   BUN 17 02/09/2017 0417   CREATININE 2.83 (H) 02/09/2017 0417   CALCIUM 8.8 (L) 02/09/2017 0417   GFRNONAA 20 (L) 02/09/2017 0417   GFRAA 23 (L) 02/09/2017 0417    INR    Component Value Date/Time   INR 0.96 02/03/2010 0240     Intake/Output Summary (Last 24 hours) at 02/09/17 0943 Last data filed at 02/09/17 0708  Gross per 24 hour  Intake               50 ml  Output             2350 ml  Net            -2300 ml     Assessment:  36 y.o. female is here with gangrene of left toes with improving erythema and swelling  Plan: Continue abx Plan for amputation of left toes 3-5 tomorrow Npo past mn Amputation possibly will be open, will close if edema allows.     Pape Parson C. Donzetta Matters, MD [facility] Office: [phone] Pager: [phone]  02/09/2017 9:43 AM

## 2017-02-09 NOTE — Care Management Note (Signed)
Case Management Note [name] RN, BSN Unit 2W-Case Manager [phone]  Patient Details  Name: Kathy Phelps MRN: [id number] Date of Birth: 01-04-1981  Subjective/Objective:    Pt admitted with gangrene and cellulitis of left foot/toes                Action/Plan: PTA pt lived at home with spouse and children- per PT eval recommendation for SNF- however per CSW pt wants to return home with spouse- per Medicaid guidelines pt would not qualify for HHPT/OT- if needed HHRN could be arranged. Pt would like RW for home-order has been placed- will have [facility] deliver proir to discharge. Pt is for OR on 5/3 for toe amputations - CM will continue to follow for d/c needs  Expected Discharge Date:                  Expected Discharge Plan:  Home/Self Care  In-House Referral:  Clinical Social Work  Discharge planning Services  CM Consult  Post Acute Care Choice:  Durable Medical Equipment Choice offered to:  Patient  DME Arranged:  [place] [facility] DME Agency:  [place]:    [place] Agency:     Status of Service:  In process, will continue to follow  If discussed at Long Length of Stay Meetings, dates discussed:  5/1  Discharge Disposition:   Additional Comments:  Dawayne Patricia, RN 02/09/2017, 11:30 AM

## 2017-02-09 NOTE — Progress Notes (Signed)
CSW consulted pt on SNF placement. Pt not agreeable to SNF at this time. CSW alerted RNCM that pt requested home health services/equipment.  CSW signing off.  Laveda Abbe LCSW [phone]

## 2017-02-09 NOTE — Anesthesia Preprocedure Evaluation (Addendum)
Anesthesia Evaluation  Patient identified by MRN, date of birth, ID band Patient awake    Reviewed: Allergy & Precautions, NPO status , Patient's Chart, lab work & pertinent test results, reviewed documented beta blocker date and time   History of Anesthesia Complications Negative for: history of anesthetic complications  Airway Mallampati: II  TM Distance: >3 FB Neck ROM: Full    Dental  (+) Loose, Poor Dentition, Dental Advisory Given   Pulmonary Current Smoker,    Pulmonary exam normal breath sounds clear to auscultation       Cardiovascular hypertension, Pt. on medications and Pt. on home beta blockers + DVT  Normal cardiovascular exam Rhythm:Regular Rate:Normal     Neuro/Psych PSYCHIATRIC DISORDERS Depression Bipolar Disorder Schizophrenia negative neurological ROS     GI/Hepatic negative GI ROS, Neg liver ROS,   Endo/Other  negative endocrine ROSdiabetes, Type 2  Renal/GU Renal InsufficiencyRenal disease     Musculoskeletal negative musculoskeletal ROS (+)   Abdominal   Peds  Hematology negative hematology ROS (+)   Anesthesia Other Findings Day of surgery medications reviewed with the patient.  Reproductive/Obstetrics                           Anesthesia Physical Anesthesia Plan  ASA: III  Anesthesia Plan: General   Post-op Pain Management: GA combined w/ Regional for post-op pain   Induction:   Airway Management Planned: LMA  Additional Equipment:   Intra-op Plan:   Post-operative Plan: Extubation in OR  Informed Consent: I have reviewed the patients History and Physical, chart, labs and discussed the procedure including the risks, benefits and alternatives for the proposed anesthesia with the patient or authorized representative who has indicated his/her understanding and acceptance.   Dental advisory given  Plan Discussed with: Anesthesiologist, CRNA and  Surgeon  Anesthesia Plan Comments:        Anesthesia Quick Evaluation

## 2017-02-09 NOTE — Progress Notes (Signed)
OT Cancellation Note  Patient Details Name: Kathy Phelps MRN: [id number] DOB: September 28, 1981   Cancelled Treatment:    Reason Eval/Treat Not Completed: Pain limiting ability to participate. Upon entry to room; pt agitated and requesting pain medication. Pt declining to work with therapy at this time even if we pre-medicate. Will follow up as time allows.  Binnie Kand M.S., OTR/L Pager: [phone]  02/09/2017, 9:20 AM

## 2017-02-09 NOTE — Progress Notes (Signed)
PT Cancellation Note  Patient Details Name: CLAUDETT BAYLY MRN: [id number] DOB: [birth year]   Cancelled Treatment:    Reason Eval/Treat Not Completed: Patient declined, no reason specified. Pt adamantly refusing therapy this AM states she is in too much pain and "don't even think about it."  Will attempt to f/u per POC.      Aubrianna Orchard E Penven-Crew 02/09/2017, 10:29 AM

## 2017-02-09 NOTE — Clinical Social Work Note (Signed)
Clinical Social Work Assessment  Patient Details  Name: Kathy Phelps MRN: [id number] Date of Birth: [birth year]  Date of referral:  02/09/17               Reason for consult:  Discharge Planning                Permission sought to share information with:  Case Manager Permission granted to share information::  Yes, Verbal Permission Granted  Name::        Agency::  NCM  Relationship::     Contact Information:     Housing/Transportation Living arrangements for the past 2 months:  Single Family Home Source of Information:  Patient Patient Interpreter Needed:  None Criminal Activity/Legal Involvement Pertinent to Current Situation/Hospitalization:  No - Comment as needed Significant Relationships:  Significant Other Lives with:  Self, Minor Children, Significant Other Do you feel safe going back to the place where you live?  Yes Need for family participation in patient care:  No (Coment)  Care giving concerns:  Pt currently resides at home with her three children and boyfriend. Pt will have increased mobility concerns and decreased ability to care for herself after discharge due to amputation surgery. Pt will need increased support to meet daily care needs, as well as need therapy to fully recover from amputation surgery. Pt's boyfriend is able to help on evenings and weekends, but he works during the day, so pt will be alone with no support during the day.   Social Worker assessment / plan:  CSW explained SNF recommendation to pt. CSW educated pt on how SNF would assist with her care post-surgery. Pt explained how she has three children at home who miss her and want her home. Pt asked if her boyfriend could come stay with her at SNF, and expressed frustration about how her kids couldn't stay with her in the hospital or visit for very long during her stay. Pt indicated she was not interested in going to SNF if her boyfriend couldn't stay with her. Pt also expressed concern about how her  kids would get cared for if she were to go to SNF.   Employment status:  Other (Comment) (Unknown) Insurance information:  Medicaid In [place] PT Recommendations:  [place] / Referral to community resources:     Patient/Family's Response to care:  Pt refused recommendation to SNF because she wants to go home to be with her kids. Pt requested referral for home health and a possible wheelchair to help her be mobile in her home.  Patient/Family's Understanding of and Emotional Response to Diagnosis, Current Treatment, and Prognosis:  Pt was guarded during conversation, and not overly open to engaging in lengthy conversation. Pt seemed to understand that she would require some increased care after amputation surgery, but believed she could handle it on her own or with her boyfriend's help. Pt seemed overwhelmed in thinking about how she would handle caring for her children if she were to be in a SNF placement for some time post-DC.   Emotional Assessment Appearance:  Appears older than stated age Attitude/Demeanor/Rapport:  Guarded Affect (typically observed):  Guarded Orientation:  Oriented to Self, Oriented to Place, Oriented to  Time, Oriented to Situation Alcohol / Substance use:  Tobacco Use Psych involvement (Current and /or in the community):  No (Comment)  Discharge Needs  Concerns to be addressed:  Discharge Planning Concerns Readmission within the last 30 days:  No Current discharge risk:  Physical Impairment  Barriers to Discharge:  Continued Medical Work up   [employer], [place] 02/09/2017, 10:06 AM

## 2017-02-09 NOTE — Progress Notes (Signed)
PROGRESS NOTE    BEAUTIFUL PENSYL  BVQ:[id number] DOB: 1980/11/16 DOA: 02/01/2017 PCP: Rubbie Battiest, NP     Brief Narrative:  36 yo female who presented to the hospital with the chief complain of left foot pain. Worsening symptoms for the last 4 weeks, refractive to outpatient oral antibiotic therapy. Initial evaluation patient found tachycardic. Left foot with significant erythema. Admitted for cellulitis and critical left lower extremity ischemia   Assessment & Plan:   Principal Problem:   Cellulitis of left foot Active Problems:   Uncontrolled type 2 diabetes mellitus with hyperglycemia (HCC)   HLD (hyperlipidemia)   Tobacco abuse   Essential hypertension   Malnutrition of moderate degree   AKI (acute kidney injury) ([place])   Cellulitis    Acute kidney injury, creatinine went from 0.87>3.53>4.29>3.7>3.49>3.03>2.83 -now improving Urine output  3525>2850   cc last 24 hrs  multi-factorial with ischemic ATN, contrast nephropathy and possible papillary necrosis from NSAIDs.Urinary retention due to neurogenic bladder is likely contributing, Renal US with mildly increased cortical echogenicity of both kidneys and mild right hydronephrosis Blood pressure soft post procedure 4/26 , now stable  Patient likely in the recovery phase of ATN with a significant diuresis. Nephrology has signed off Continue to follow renal function closely     Left foot cellulitis with possible gangrene.   Holding vancomycin given acute kidney injury and continuing zosyn IV,  Left ABI indicated moderate peripheral arterial disease, status post aortogram/balloon angioplasty by vascular 4/26. .  Buddy Duty on fentanyl PCA by Dr. Scot Dock for uncontrolled pain. Fentanyl PCA discontinued 4/29 due to somnolence  and respiratory depression, requiring Narcan. Patient has now been transitioned to low dose OxyContin, oxycodone, when necessary iv Dilaudid. Minimize IV narcotics Dr. Donzetta Matters will make a decision  about the timing of amputation   T2DM. Uncontrolled-noted to be hypoglycemic in the setting of recent change in renal function, resumed Lantus at a lower dose , hemoglobin A1c  11.8. Now requiring periodic adjustments in Lantus dosing gabapentin reduced secondary to acute kidney injury Decrease dose of Lantus to 10 units twice a day and monitor  HTN. Continue blood pressure control with metoprolol with hold parameters.   Tobacco abuse,on  nicotine patch  Dyslipidemia. Continue statin therapy with simvastatin.   Bipolar disorder/depression/anxiety/schizophrenia. Lot of psychosocial stressors,  Continue trileptal. latuda and alprazolam. Adjust dose for renal function   Right submandibular mass.   soft tissue ultrasound showed ill-defined hypoechoic mass in the submandibular region measuring 2.5 x 1.3 x 2.0 cm. CT head and neck -1.7 cm right submandibular mass, possibly infectious, neoplasm cannot be ruled out, will need outpatient oral surgery evaluation  Urinary retention 2/2 to neurogenic bladder with uncontrolled diabetes. Renal US with mild right hydronephrosis. Patient states that previously Flomax has not worked, continue indwelling Foley. Will need urology follow-up         DVT prophylaxis: scd  Code Status: full  Family Communication: discussed with daughter and husband in the room Disposition Plan:  As per vascular surgery,surgery on Thursday this week   Consultants:   Vascular  Nephrology  Procedures:   Aortogram 4/26   Antimicrobials:   Vancomycin  Zosyn     Subjective: Comfortable, would like to go out and smoke  Objective: Vitals:   02/08/17 2100 02/08/17 2240 02/08/17 2242 02/09/17 0416  BP: (!) 169/99 (!) 169/98 (!) 164/86 134/82  Pulse: 98   81  Resp: 18   16  Temp: 98.3 F (36.8 C)  97.9 F (36.6 C)  TempSrc: Oral   Oral  SpO2: 100%   99%  Weight:      Height:        Intake/Output Summary (Last 24 hours) at 02/09/17 0903 Last data  filed at 02/09/17 0708  Gross per 24 hour  Intake               50 ml  Output             3200 ml  Net            -3150 ml   Filed Weights   02/02/17 0242  Weight: 61.2 kg (135 lb)    Examination:  General exam: deconditioned and ill looking appearing E ENT: mild pallor, no icterus.  Respiratory system: Clear to auscultation. Respiratory effort normal. Cardiovascular system: S1 & S2 heard, RRR. No JVD, murmurs, rubs, gallops or clicks. No pedal edema. Gastrointestinal system: Abdomen is nondistended, soft and nontender. No organomegaly or masses felt. Normal bowel sounds heard. Central nervous system: Alert and oriented. No focal neurological deficits. Extremities: Symmetric 5 x 5 power. Skin: Left foot with 3th and 4th toe discoloration, cyanotic with decrease local temperature, pulses are palpable. Noted for foot erythema, expanding to the leg, with ill defined margins.  .     Data Reviewed: I have personally reviewed following labs and imaging studies  CBC:  Recent Labs Lab 02/04/17 0429 02/05/17 0330 02/07/17 0204 02/08/17 0211 02/09/17 0417  WBC 14.0* 13.4* 10.0 8.9 8.9  HGB 10.7* 10.6* 10.2* 9.3* 10.1*  HCT 31.8* 31.5* 30.9* 28.4* 30.0*  MCV 88.3 89.0 90.6 90.2 89.8  PLT 186 198 227 221 914   Basic Metabolic Panel:  Recent Labs Lab 02/05/17 0330 02/06/17 0324 02/07/17 0204 02/08/17 0211 02/09/17 0417  NA 137 134* 138 136 137  K 4.1 3.9 4.8 4.1 4.8  CL 102 101 102 105 104  CO2 25 25 28 24 23   GLUCOSE 72 218* 170* 79 74  BUN 24* 22* 19 16 17   CREATININE 4.29* 3.70* 3.49* 3.03* 2.83*  CALCIUM 8.5* 8.5* 9.3 8.7* 8.8*  PHOS  --   --  5.6*  --   --    GFR: Estimated Creatinine Clearance: 26.3 mL/min (A) (by C-G formula based on SCr of 2.83 mg/dL (H)). Liver Function Tests:  Recent Labs Lab 02/04/17 0429 02/05/17 0330 02/07/17 0204 02/08/17 0211  AST 42* 37  --  16  ALT 13* 14  --  10*  ALKPHOS 101 132*  --  120  BILITOT 0.7 0.8  --  0.5  PROT  5.2* 5.7*  --  6.0*  ALBUMIN 2.0* 2.0* 2.2* 2.0*   No results for input(s): LIPASE, AMYLASE in the last 168 hours. No results for input(s): AMMONIA in the last 168 hours. Coagulation Profile: No results for input(s): INR, PROTIME in the last 168 hours. Cardiac Enzymes:  Recent Labs Lab 02/04/17 0429  CKTOTAL <5*   BNP (last 3 results) No results for input(s): PROBNP in the last 8760 hours. HbA1C: No results for input(s): HGBA1C in the last 72 hours. CBG:  Recent Labs Lab 02/08/17 1627 02/08/17 2106 02/08/17 2230 02/08/17 2313 02/09/17 0511  GLUCAP 76 70 68 109* 70   Lipid Profile: No results for input(s): CHOL, HDL, LDLCALC, TRIG, CHOLHDL, LDLDIRECT in the last 72 hours. Thyroid Function Tests: No results for input(s): TSH, T4TOTAL, FREET4, T3FREE, THYROIDAB in the last 72 hours. Anemia Panel: No results for input(s): VITAMINB12, FOLATE, FERRITIN,  TIBC, IRON, RETICCTPCT in the last 72 hours. Sepsis Labs:  Recent Labs Lab 02/02/17 1404 02/02/17 1552  LATICACIDVEN 1.5 1.2    Recent Results (from the past 240 hour(s))  MRSA PCR Screening     Status: None   Collection Time: 02/03/17 12:25 AM  Result Value Ref Range Status   MRSA by PCR NEGATIVE NEGATIVE Final    Comment:        The GeneXpert MRSA Assay (FDA approved for NASAL specimens only), is one component of a comprehensive MRSA colonization surveillance program. It is not intended to diagnose MRSA infection nor to guide or monitor treatment for MRSA infections.          Radiology Studies: No results found.      Scheduled Meds: . calcium carbonate  1 tablet Oral TID  . clopidogrel  75 mg Oral Q breakfast  . feeding supplement (GLUCERNA SHAKE)  237 mL Oral TID BM  . gabapentin  400 mg Oral TID  . heparin  5,000 Units Subcutaneous Q8H  . insulin aspart  0-9 Units Subcutaneous TID WC  . insulin glargine  10 Units Subcutaneous BID  . living well with diabetes book   Does not apply Once  .  lurasidone  120 mg Oral QHS  . metoprolol tartrate  12.5 mg Oral BID  . mometasone-formoterol  2 puff Inhalation BID  . multivitamin with minerals  1 tablet Oral Daily  . nicotine  21 mg Transdermal Daily  . OXcarbazepine  600 mg Oral BID  . oxyCODONE  10 mg Oral Q12H  . pantoprazole  40 mg Oral Daily  . polyethylene glycol  17 g Oral BID  . simvastatin  40 mg Oral Daily  . sucralfate  1 g Oral TID WC & HS   Continuous Infusions: . methocarbamol (ROBAXIN)  IV 500 mg (02/04/17 1441)  . piperacillin-tazobactam (ZOSYN)  IV 3.375 g (02/09/17 0517)     LOS: 7 days      Reyne Dumas, MD [facility] Pager [phone]  If 7PM-7AM, please contact night-coverage [email or web address] Password [email or web address] 02/09/2017, 9:03 AM

## 2017-02-10 ENCOUNTER — Encounter (HOSPITAL_COMMUNITY)
Admission: EM | Disposition: A | Discharge status: Home Health | Payer: Self-pay | Source: Home / Self Care | Attending: Internal Medicine

## 2017-02-10 ENCOUNTER — Encounter (HOSPITAL_COMMUNITY): Payer: Self-pay | Admitting: Anesthesiology

## 2017-02-10 ENCOUNTER — Inpatient Hospital Stay (HOSPITAL_COMMUNITY): Payer: Medicaid Other | Admitting: Anesthesiology

## 2017-02-10 DIAGNOSIS — R601 Generalized edema: Secondary | ICD-10-CM

## 2017-02-10 DIAGNOSIS — R609 Edema, unspecified: Secondary | ICD-10-CM

## 2017-02-10 HISTORY — PX: AMPUTATION: SHX166

## 2017-02-10 LAB — BASIC METABOLIC PANEL
Anion gap: 9 (ref 5–15)
BUN: 17 mg/dL (ref 6–20)
CALCIUM: 9.1 mg/dL (ref 8.9–10.3)
CHLORIDE: 97 mmol/L — AB (ref 101–111)
CO2: 32 mmol/L (ref 22–32)
Creatinine, Ser: 2.92 mg/dL — ABNORMAL HIGH (ref 0.44–1.00)
GFR calc Af Amer: 23 mL/min — ABNORMAL LOW (ref >60)
GFR calc non Af Amer: 19 mL/min — ABNORMAL LOW (ref >60)
Glucose, Bld: 115 mg/dL — ABNORMAL HIGH (ref 65–99)
POTASSIUM: 4.6 mmol/L (ref 3.5–5.1)
Sodium: 138 mmol/L (ref 135–145)

## 2017-02-10 LAB — GLUCOSE, CAPILLARY
GLUCOSE-CAPILLARY: 101 mg/dL — AB (ref 65–99)
GLUCOSE-CAPILLARY: 108 mg/dL — AB (ref 65–99)
GLUCOSE-CAPILLARY: 150 mg/dL — AB (ref 65–99)
Glucose-Capillary: 111 mg/dL — ABNORMAL HIGH (ref 65–99)
Glucose-Capillary: 246 mg/dL — ABNORMAL HIGH (ref 65–99)
Glucose-Capillary: 207 mg/dL — ABNORMAL HIGH (ref 65–99)

## 2017-02-10 LAB — CBC
HEMATOCRIT: 30.3 % — AB (ref 36.0–46.0)
Hemoglobin: 9.7 g/dL — ABNORMAL LOW (ref 12.0–15.0)
MCH: 29 pg (ref 26.0–34.0)
MCHC: 32 g/dL (ref 30.0–36.0)
MCV: 90.4 fL (ref 78.0–100.0)
Platelets: 284 10*3/uL (ref 150–400)
RBC: 3.35 MIL/uL — ABNORMAL LOW (ref 3.87–5.11)
RDW: 12.7 % (ref 11.5–15.5)
WBC: 9.8 10*3/uL (ref 4.0–10.5)

## 2017-02-10 LAB — PROTIME-INR
INR: 1.09
Prothrombin Time: 14.1 seconds (ref 11.4–15.2)

## 2017-02-10 SURGERY — AMPUTATION DIGIT
Anesthesia: General | Site: Foot | Laterality: Left

## 2017-02-10 MED ORDER — 0.9 % SODIUM CHLORIDE (POUR BTL) OPTIME
TOPICAL | Status: DC | PRN
  Administered 2017-02-10: 1000 mL

## 2017-02-10 MED ORDER — HYDROMORPHONE HCL 1 MG/ML IJ SOLN: 0.2500 mg | INTRAMUSCULAR | Status: DC | PRN

## 2017-02-10 MED ORDER — ARTIFICIAL TEARS OPHTHALMIC OINT
TOPICAL_OINTMENT | OPHTHALMIC | Status: AC
  Filled 2017-02-10: qty 3.5

## 2017-02-10 MED ORDER — HYDROMORPHONE HCL 1 MG/ML IJ SOLN
1.0000 mg | INTRAMUSCULAR | Status: DC | PRN
  Administered 2017-02-10 – 2017-02-11 (×6): 1 mg via INTRAVENOUS
  Filled 2017-02-10 (×6): qty 1

## 2017-02-10 MED ORDER — CEFAZOLIN SODIUM-DEXTROSE 2-3 GM-% IV SOLR
2.0000 g | Freq: Once | INTRAVENOUS | Status: AC
  Administered 2017-02-10: 2 g via INTRAVENOUS
  Filled 2017-02-10: qty 50

## 2017-02-10 MED ORDER — LIDOCAINE 2% (20 MG/ML) 5 ML SYRINGE
INTRAMUSCULAR | Status: AC
  Filled 2017-02-10: qty 5

## 2017-02-10 MED ORDER — BUPIVACAINE-EPINEPHRINE (PF) 0.5% -1:200000 IJ SOLN
INTRAMUSCULAR | Status: DC | PRN
  Administered 2017-02-10: 30 mL via PERINEURAL

## 2017-02-10 MED ORDER — PROMETHAZINE HCL 25 MG/ML IJ SOLN: 6.2500 mg | INTRAMUSCULAR | Status: DC | PRN

## 2017-02-10 MED ORDER — MIDAZOLAM HCL 2 MG/2ML IJ SOLN
INTRAMUSCULAR | Status: AC
  Filled 2017-02-10: qty 2

## 2017-02-10 MED ORDER — SODIUM CHLORIDE 0.9 % IV SOLN
INTRAVENOUS | Status: DC
  Administered 2017-02-11: 08:00:00 via INTRAVENOUS

## 2017-02-10 MED ORDER — LACTATED RINGERS IV SOLN
INTRAVENOUS | Status: DC | PRN
  Administered 2017-02-10: 07:00:00 via INTRAVENOUS

## 2017-02-10 MED ORDER — PROPOFOL 10 MG/ML IV BOLUS
INTRAVENOUS | Status: AC
  Filled 2017-02-10: qty 20

## 2017-02-10 MED ORDER — LIDOCAINE HCL (PF) 1 % IJ SOLN
INTRAMUSCULAR | Status: DC | PRN
  Administered 2017-02-10: 15 mL

## 2017-02-10 MED ORDER — ONDANSETRON HCL 4 MG/2ML IJ SOLN
INTRAMUSCULAR | Status: DC | PRN
  Administered 2017-02-10: 4 mg via INTRAVENOUS

## 2017-02-10 MED ORDER — FENTANYL CITRATE (PF) 100 MCG/2ML IJ SOLN
INTRAMUSCULAR | Status: DC | PRN
  Administered 2017-02-10: 100 ug via INTRAVENOUS

## 2017-02-10 MED ORDER — PROPOFOL 10 MG/ML IV BOLUS
INTRAVENOUS | Status: DC | PRN
  Administered 2017-02-10: 200 mg via INTRAVENOUS

## 2017-02-10 MED ORDER — PHENYLEPHRINE HCL 10 MG/ML IJ SOLN
INTRAMUSCULAR | Status: DC | PRN
  Administered 2017-02-10 (×2): 40 ug via INTRAVENOUS

## 2017-02-10 MED ORDER — FENTANYL CITRATE (PF) 250 MCG/5ML IJ SOLN
INTRAMUSCULAR | Status: AC
  Filled 2017-02-10: qty 5

## 2017-02-10 MED ORDER — PHENYLEPHRINE 40 MCG/ML (10ML) SYRINGE FOR IV PUSH (FOR BLOOD PRESSURE SUPPORT)
PREFILLED_SYRINGE | INTRAVENOUS | Status: AC
  Filled 2017-02-10: qty 10

## 2017-02-10 MED ORDER — CEFAZOLIN SODIUM 1 G IJ SOLR
INTRAMUSCULAR | Status: AC
  Filled 2017-02-10: qty 20

## 2017-02-10 MED ORDER — ONDANSETRON HCL 4 MG/2ML IJ SOLN
INTRAMUSCULAR | Status: AC
  Filled 2017-02-10: qty 2

## 2017-02-10 MED ORDER — MIDAZOLAM HCL 5 MG/5ML IJ SOLN
INTRAMUSCULAR | Status: DC | PRN
  Administered 2017-02-10 (×2): 1 mg via INTRAVENOUS

## 2017-02-10 MED ORDER — LIDOCAINE HCL 1 % IJ SOLN
INTRAMUSCULAR | Status: AC
  Filled 2017-02-10: qty 20

## 2017-02-10 SURGICAL SUPPLY — 44 items
BANDAGE ACE 4X5 VEL STRL LF (GAUZE/BANDAGES/DRESSINGS) ×3 IMPLANT
BLADE AVERAGE 25MMX9MM (BLADE) ×1
BLADE AVERAGE 25X9 (BLADE) ×2 IMPLANT
BLADE SAW SGTL 81X20 HD (BLADE) IMPLANT
BNDG CONFORM 3 STRL LF (GAUZE/BANDAGES/DRESSINGS) IMPLANT
BNDG GAUZE ELAST 4 BULKY (GAUZE/BANDAGES/DRESSINGS) ×3 IMPLANT
CANISTER SUCT 3000ML PPV (MISCELLANEOUS) ×3 IMPLANT
COVER SURGICAL LIGHT HANDLE (MISCELLANEOUS) ×3 IMPLANT
DRAPE EXTREMITY T 121X128X90 (DRAPE) ×3 IMPLANT
DRAPE HALF SHEET 40X57 (DRAPES) ×3 IMPLANT
DRSG VAC ATS SM SENSATRAC (GAUZE/BANDAGES/DRESSINGS) ×3 IMPLANT
ELECT REM PT RETURN 9FT ADLT (ELECTROSURGICAL) ×3
ELECTRODE REM PT RTRN 9FT ADLT (ELECTROSURGICAL) ×1 IMPLANT
GAUZE SPONGE 4X4 12PLY STRL (GAUZE/BANDAGES/DRESSINGS) ×3 IMPLANT
GLOVE BIO SURGEON STRL SZ7.5 (GLOVE) ×6 IMPLANT
GLOVE BIO SURGEON STRL SZ8 (GLOVE) ×3 IMPLANT
GLOVE BIOGEL PI IND STRL 6.5 (GLOVE) ×1 IMPLANT
GLOVE BIOGEL PI IND STRL 8 (GLOVE) ×1 IMPLANT
GLOVE BIOGEL PI INDICATOR 6.5 (GLOVE) ×2
GLOVE BIOGEL PI INDICATOR 8 (GLOVE) ×2
GLOVE ECLIPSE 7.5 STRL STRAW (GLOVE) ×3 IMPLANT
GLOVE SURG SS PI 8.0 STRL IVOR (GLOVE) ×3 IMPLANT
GOWN STRL REUS W/ TWL LRG LVL3 (GOWN DISPOSABLE) ×2 IMPLANT
GOWN STRL REUS W/ TWL XL LVL3 (GOWN DISPOSABLE) ×2 IMPLANT
GOWN STRL REUS W/TWL 2XL LVL3 (GOWN DISPOSABLE) ×3 IMPLANT
GOWN STRL REUS W/TWL LRG LVL3 (GOWN DISPOSABLE) ×6
GOWN STRL REUS W/TWL XL LVL3 (GOWN DISPOSABLE) ×6
KIT BASIN OR (CUSTOM PROCEDURE TRAY) ×3 IMPLANT
KIT ROOM TURNOVER OR (KITS) ×3 IMPLANT
NEEDLE HYPO 25GX1X1/2 BEV (NEEDLE) ×3 IMPLANT
NS IRRIG 1000ML POUR BTL (IV SOLUTION) ×3 IMPLANT
PACK GENERAL/GYN (CUSTOM PROCEDURE TRAY) ×3 IMPLANT
PAD ARMBOARD 7.5X6 YLW CONV (MISCELLANEOUS) ×3 IMPLANT
SPECIMEN JAR SMALL (MISCELLANEOUS) ×3 IMPLANT
SUT ETHILON 3 0 PS 1 (SUTURE) ×3 IMPLANT
SUT VIC AB 2-0 CT1 27 (SUTURE) ×3
SUT VIC AB 2-0 CT1 TAPERPNT 27 (SUTURE) ×1 IMPLANT
SWAB CULTURE ESWAB REG 1ML (MISCELLANEOUS) IMPLANT
SYR CONTROL 10ML LL (SYRINGE) ×3 IMPLANT
TOWEL OR 17X24 6PK STRL BLUE (TOWEL DISPOSABLE) ×3 IMPLANT
TOWEL OR 17X26 10 PK STRL BLUE (TOWEL DISPOSABLE) ×3 IMPLANT
UNDERPAD 30X30 (UNDERPADS AND DIAPERS) ×3 IMPLANT
WATER STERILE IRR 1000ML POUR (IV SOLUTION) ×3 IMPLANT
WND VAC CANISTER 500ML (MISCELLANEOUS) ×3 IMPLANT

## 2017-02-10 NOTE — Progress Notes (Signed)
Dr. Oneida Alar stated it is ok to give patient's morning Plavix and Heparin SQ prior to today's surgery.

## 2017-02-10 NOTE — Anesthesia Procedure Notes (Signed)
Procedure Name: LMA Insertion Date/Time: 02/10/2017 7:37 AM Performed by: Luciana Axe K Pre-anesthesia Checklist: Patient identified, Emergency Drugs available, Suction available and Patient being monitored Patient Re-evaluated:Patient Re-evaluated prior to inductionOxygen Delivery Method: Circle System Utilized Preoxygenation: Pre-oxygenation with 100% oxygen Intubation Type: IV induction Ventilation: Mask ventilation without difficulty LMA: LMA inserted LMA Size: 4.0 Number of attempts: 1 Airway Equipment and Method: Bite block Placement Confirmation: positive ETCO2 and breath sounds checked- equal and bilateral Tube secured with: Tape Dental Injury: Teeth and Oropharynx as per pre-operative assessment

## 2017-02-10 NOTE — Anesthesia Procedure Notes (Signed)
Anesthesia Regional Block: Popliteal block   Pre-Anesthetic Checklist: ,, timeout performed, Correct Patient, Correct Site, Correct Laterality, Correct Procedure, Correct Position, site marked, Risks and benefits discussed,  Surgical consent,  Pre-op evaluation,  At surgeon's request and post-op pain management  Laterality: Left  Prep: chloraprep       Needles:  Injection technique: Single-shot  Needle Type: Echogenic Stimulator Needle          Additional Needles:   Procedures: ultrasound guided,,,,,,,,  Narrative:  Start time: 02/10/2017 7:02 AM End time: 02/10/2017 7:12 AM Injection made incrementally with aspirations every 5 mL.  Performed by: Personally  Anesthesiologist: Duane Boston  Additional Notes: A functioning IV was confirmed and monitors were applied.  Sterile prep and drape, hand hygiene and sterile gloves were used.  Negative aspiration and test dose prior to incremental administration of local anesthetic. The patient tolerated the procedure well.Ultrasound  guidance: relevant anatomy identified, needle position confirmed, local anesthetic spread visualized around nerve(s), vascular puncture avoided.  Image printed for medical record.

## 2017-02-10 NOTE — Progress Notes (Signed)
Orthopedic Tech Progress Note Patient Details:  Kathy Phelps Dec 23, 1980 [id number]  Ortho Devices Type of Ortho Device: Postop shoe/boot Ortho Device/Splint Location: Provided and fitted post op shoe for pt left foot.  At this time pt currently has wound vac attached to left foot therefore post op shoe was placed at bedside.   (Left Foot) Ortho Device/Splint Interventions: Application, Adjustment   Kristopher Oppenheim 02/10/2017, 12:40 PM

## 2017-02-10 NOTE — Anesthesia Postprocedure Evaluation (Addendum)
Anesthesia Post Note  Patient: Kathy Phelps  Procedure(s) Performed: Procedure(s) (LRB): AMPUTATION TOES 3, 4 AND 5  LEFT FOOT (Left)  Patient location during evaluation: PACU Anesthesia Type: General Level of consciousness: sedated Pain management: pain level controlled Vital Signs Assessment: post-procedure vital signs reviewed and stable Respiratory status: spontaneous breathing and respiratory function stable Cardiovascular status: stable Anesthetic complications: no       Last Vitals:  Vitals:   02/10/17 0900 02/10/17 0922  BP: 127/80 139/74  Pulse: 77 79  Resp: 11 12  Temp: 36.5 C 36.7 C                  Verdell [name]

## 2017-02-10 NOTE — Transfer of Care (Signed)
Immediate Anesthesia Transfer of Care Note  Patient: Kathy Phelps  Procedure(s) Performed: Procedure(s): AMPUTATION TOES 3, 4 AND 5  LEFT FOOT (Left)  Patient Location: PACU  Anesthesia Type:General  Level of Consciousness: sedated and patient cooperative  Airway & Oxygen Therapy: Patient Spontanous Breathing and Patient connected to face mask oxygen  Post-op Assessment: Report given to RN and Post -op Vital signs reviewed and stable  Post vital signs: Reviewed  Last Vitals:  Vitals:   02/10/17 0827 02/10/17 0830  BP:  119/76  Pulse: 79 78  Resp: 10 10  Temp: 36.5 C     Last Pain:  Vitals:   02/10/17 0830  TempSrc:   PainSc: 0-No pain      Patients Stated Pain Goal: 0 (37/90/24 0973)  Complications: No apparent anesthesia complications

## 2017-02-10 NOTE — Progress Notes (Signed)
  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 3-5 Stable edema left foot Palpable left dp  CBC    Component Value Date/Time   WBC 9.8 02/10/2017 0154   RBC 3.35 (L) 02/10/2017 0154   HGB 9.7 (L) 02/10/2017 0154   HCT 30.3 (L) 02/10/2017 0154   PLT 284 02/10/2017 0154   MCV 90.4 02/10/2017 0154   MCH 29.0 02/10/2017 0154   MCHC 32.0 02/10/2017 0154   RDW 12.7 02/10/2017 0154   LYMPHSABS 4.0 02/02/2017 0615   MONOABS 0.6 02/02/2017 0615   EOSABS 0.3 02/02/2017 0615   BASOSABS 0.0 02/02/2017 0615    BMET    Component Value Date/Time   NA 138 02/10/2017 0154   K 4.6 02/10/2017 0154   CL 97 (L) 02/10/2017 0154   CO2 32 02/10/2017 0154   GLUCOSE 115 (H) 02/10/2017 0154   BUN 17 02/10/2017 0154   CREATININE 2.92 (H) 02/10/2017 0154   CALCIUM 9.1 02/10/2017 0154   GFRNONAA 19 (L) 02/10/2017 0154   GFRAA 23 (L) 02/10/2017 0154    INR    Component Value Date/Time   INR 1.09 02/10/2017 0154     Intake/Output Summary (Last 24 hours) at 02/10/17 0743 Last data filed at 02/10/17 0526  Gross per 24 hour  Intake              250 ml  Output             3200 ml  Net            -2950 ml     Assessment:  36 y.o. female is s/p revascularization of lle with dry gangrene of toes  Plan: OR today for amputation of left 3-5 toes  Discussed risks and benefits again and she agrees to proceed   Dahlgren C. Donzetta Matters, MD [facility] Office: [phone] Pager: [phone]  02/10/2017 7:43 AM

## 2017-02-10 NOTE — Progress Notes (Signed)
PT Cancellation Note  Patient Details Name: Kathy Phelps MRN: [id number] DOB: 09-22-1981   Cancelled Treatment:    Reason Eval/Treat Not Completed: Medical issues which prohibited therapy. Pt with left toe amputations this AM. Will follow up at later date.   [place] 02/10/2017, 11:12 AM [place]

## 2017-02-10 NOTE — Progress Notes (Signed)
PROGRESS NOTE    Kathy Phelps  LZJ:[id number] DOB: [birth year] DOA: 02/01/2017 PCP: Rubbie Battiest, NP     Brief Narrative:  36 yo female who presented to the hospital with the chief complain of left foot pain. Worsening symptoms for the last 4 weeks, refractive to outpatient oral antibiotic therapy. Initial evaluation patient found tachycardic. Left foot with significant erythema. Admitted for cellulitis and critical left lower extremity ischemia. S/p Aortogram with bilateral lower extremity runoff, 4/26. To OR today 5/3  for amputation of left 3-5 toes by Dr Donzetta Matters.   Assessment & Plan:   Principal Problem:   Cellulitis of left foot Active Problems:   Uncontrolled type 2 diabetes mellitus with hyperglycemia (HCC)   HLD (hyperlipidemia)   Tobacco abuse   Essential hypertension   Malnutrition of moderate degree   AKI (acute kidney injury) ([place])   Cellulitis   Edema    Acute kidney injury, creatinine went from 0.87>3.53>4.29>3.7>3.49>3.03>2.83>2.92 -now improving Urine output  3525>2850 >3550  cc last 24 hrs  multi-factorial with ischemic ATN, contrast nephropathy and possible papillary necrosis from NSAIDs.Urinary retention due to neurogenic bladder is likely contributing, Renal US with mildly increased cortical echogenicity of both kidneys and mild right hydronephrosis Blood pressure soft post procedure 4/26 , now stable  Patient likely in the recovery phase of ATN with a significant diuresis. Nephrology has signed off Continue to follow renal function closely     Left foot cellulitis with possible gangrene.  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 .  Buddy Duty on fentanyl PCA by Dr. Scot Dock for uncontrolled pain. Fentanyl PCA discontinued 4/29 due to somnolence  and respiratory  depression, requiring Narcan. Patient has now been transitioned to low dose OxyContin, oxycodone, when necessary iv Dilaudid. Minimize IV narcotics Dr. Donzetta Matters will make recommendations about wound care .Follow culture . Vancomycin held 4/29 due to AKI. 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 Uncontrolled-noted to be hypoglycemic in the setting of recent change in renal function, resumed Lantus at a lower dose , hemoglobin A1c  11.8. Now requiring periodic adjustments in Lantus dosing gabapentin reduced secondary to acute kidney injury Decrease dose of Lantus to 10 units twice a day and monitor  HTN. Continue blood pressure control with metoprolol with hold parameters.   Tobacco abuse,on  nicotine patch  Dyslipidemia. Continue statin therapy with simvastatin.   Bipolar disorder/depression/anxiety/schizophrenia. Lot of psychosocial stressors,  Continue trileptal. latuda and alprazolam. Adjust dose for renal function   Right submandibular mass.   soft tissue ultrasound showed ill-defined hypoechoic mass in the submandibular region measuring 2.5 x 1.3 x 2.0 cm. CT head and neck -1.7 cm right submandibular mass, possibly infectious, neoplasm cannot be ruled out, will need outpatient oral surgery evaluation  Urinary retention 2/2 to neurogenic bladder with uncontrolled diabetes. Renal US with mild right hydronephrosis. Patient states that previously Flomax has not worked, continue indwelling Foley. Will need urology follow-up         DVT prophylaxis: scd  Code Status: full  Family Communication: discussed with daughter and husband in the room Disposition Plan:  Refusing SNF, would like to go home with home health   Consultants:   Vascular  Nephrology  Procedures:   Aortogram 4/26   Antimicrobials:  Anti-infectives  Start     Dose/Rate Route Frequency Ordered Stop   02/10/17 0800  ceFAZolin (ANCEF) IVPB  2 g/50 mL premix     2 g 100 mL/hr over 30 Minutes Intravenous  Once 02/10/17 0759 02/10/17 0740   02/08/17 1400  [MAR Hold]  piperacillin-tazobactam (ZOSYN) IVPB 3.375 g     (MAR Hold since 02/10/17 0638)   3.375 g 12.5 mL/hr over 240 Minutes Intravenous Every 8 hours 02/08/17 1005     02/06/17 1100  vancomycin (VANCOCIN) 500 mg in sodium chloride 0.9 % 100 mL IVPB     500 mg 100 mL/hr over 60 Minutes Intravenous  Once 02/06/17 1017 02/06/17 1237   02/05/17 1400  piperacillin-tazobactam (ZOSYN) IVPB 2.25 g  Status:  Discontinued     2.25 g 100 mL/hr over 30 Minutes Intravenous Every 6 hours 02/05/17 1321 02/08/17 1005   02/02/17 1000  vancomycin (VANCOCIN) IVPB 1000 mg/200 mL premix  Status:  Discontinued     1,000 mg 200 mL/hr over 60 Minutes Intravenous Every 12 hours 02/02/17 0529 02/03/17 2225   02/02/17 0600  piperacillin-tazobactam (ZOSYN) IVPB 3.375 g  Status:  Discontinued     3.375 g 12.5 mL/hr over 240 Minutes Intravenous Every 8 hours 02/02/17 0529 02/05/17 1321   02/01/17 2345  vancomycin (VANCOCIN) IVPB 1000 mg/200 mL premix     1,000 mg 200 mL/hr over 60 Minutes Intravenous  Once 02/01/17 2333 02/02/17 0130   02/01/17 2345  piperacillin-tazobactam (ZOSYN) IVPB 3.375 g     3.375 g 100 mL/hr over 30 Minutes Intravenous  Once 02/01/17 2333 02/02/17 0018          Subjective: Frustrated that patient's wound VAC has not been applied properly  Objective: Vitals:   02/09/17 1308 02/09/17 2009 02/10/17 0100 02/10/17 0542  BP: (!) 153/84 (!) 171/101 139/84 (!) 170/94  Pulse: 83     Resp: 18 18    Temp: 97.7 F (36.5 C) 98.6 F (37 C)  97.7 F (36.5 C)  TempSrc: Oral Oral  Oral  SpO2: 100% 100% 100% 100%  Weight:      Height:        Intake/Output Summary (Last 24 hours) at 02/10/17 0800 Last data filed at 02/10/17 0526  Gross per 24 hour  Intake              250 ml  Output             3200 ml  Net            -2950 ml   Filed Weights   02/02/17 0242    Weight: 61.2 kg (135 lb)    Examination:  General exam: deconditioned and ill looking appearing E ENT: mild pallor, no icterus.  Respiratory system: Clear to auscultation. Respiratory effort normal. Cardiovascular system: S1 & S2 heard, RRR. No JVD, murmurs, rubs, gallops or clicks. No pedal edema. Gastrointestinal system: Abdomen is nondistended, soft and nontender. No organomegaly or masses felt. Normal bowel sounds heard. Central nervous system: Alert and oriented. No focal neurological deficits. Extremities: Symmetric 5 x 5 power. Skin: Left foot with 3th and 4th toe discoloration, cyanotic with decrease local temperature, pulses are palpable. Noted for foot erythema, expanding to the leg, with ill defined margins.  .     Data Reviewed: I have personally reviewed following labs and imaging studies  CBC:  Recent Labs Lab 02/05/17 0330 02/07/17 0204 02/08/17 0211 02/09/17 0417 02/10/17 0154  WBC 13.4* 10.0 8.9 8.9 9.8  HGB 10.6* 10.2* 9.3* 10.1* 9.7*  HCT 31.5* 30.9* 28.4* 30.0* 30.3*  MCV 89.0 90.6 90.2 89.8 90.4  PLT 198 227 221 237 426   Basic Metabolic Panel:  Recent Labs Lab 02/06/17 0324 02/07/17 0204 02/08/17 0211 02/09/17 0417 02/10/17 0154  NA 134* 138 136 137 138  K 3.9 4.8 4.1 4.8 4.6  CL 101 102 105 104 97*  CO2 25 28 24 23  32  GLUCOSE 218* 170* 79 74 115*  BUN 22* 19 16 17 17   CREATININE 3.70* 3.49* 3.03* 2.83* 2.92*  CALCIUM 8.5* 9.3 8.7* 8.8* 9.1  PHOS  --  5.6*  --   --   --    GFR: Estimated Creatinine Clearance: 25.5 mL/min (A) (by C-G formula based on SCr of 2.92 mg/dL (H)). Liver Function Tests:  Recent Labs Lab 02/04/17 0429 02/05/17 0330 02/07/17 0204 02/08/17 0211  AST 42* 37  --  16  ALT 13* 14  --  10*  ALKPHOS 101 132*  --  120  BILITOT 0.7 0.8  --  0.5  PROT 5.2* 5.7*  --  6.0*  ALBUMIN 2.0* 2.0* 2.2* 2.0*   No results for input(s): LIPASE, AMYLASE in the last 168 hours. No results for input(s): AMMONIA in the last  168 hours. Coagulation Profile:  Recent Labs Lab 02/10/17 0154  INR 1.09   Cardiac Enzymes:  Recent Labs Lab 02/04/17 0429  CKTOTAL <5*   BNP (last 3 results) No results for input(s): PROBNP in the last 8760 hours. HbA1C: No results for input(s): HGBA1C in the last 72 hours. CBG:  Recent Labs Lab 02/09/17 0957 02/09/17 1100 02/09/17 1654 02/09/17 2047 02/10/17 0629  GLUCAP 131* 121* 179* 168* 101*   Lipid Profile: No results for input(s): CHOL, HDL, LDLCALC, TRIG, CHOLHDL, LDLDIRECT in the last 72 hours. Thyroid Function Tests: No results for input(s): TSH, T4TOTAL, FREET4, T3FREE, THYROIDAB in the last 72 hours. Anemia Panel: No results for input(s): VITAMINB12, FOLATE, FERRITIN, TIBC, IRON, RETICCTPCT in the last 72 hours. Sepsis Labs: No results for input(s): PROCALCITON, LATICACIDVEN in the last 168 hours.  Recent Results (from the past 240 hour(s))  MRSA PCR Screening     Status: None   Collection Time: 02/03/17 12:25 AM  Result Value Ref Range Status   MRSA by PCR NEGATIVE NEGATIVE Final    Comment:        The GeneXpert MRSA Assay (FDA approved for NASAL specimens only), is one component of a comprehensive MRSA colonization surveillance program. It is not intended to diagnose MRSA infection nor to guide or monitor treatment for MRSA infections.          Radiology Studies: No results found.      Scheduled Meds: . [MAR Hold] calcium carbonate  1 tablet Oral TID  . [MAR Hold] clopidogrel  75 mg Oral Q breakfast  . [MAR Hold] feeding supplement (GLUCERNA SHAKE)  237 mL Oral TID BM  . [MAR Hold] gabapentin  400 mg Oral TID  . [MAR Hold] heparin  5,000 Units Subcutaneous Q8H  . [MAR Hold] insulin aspart  0-9 Units Subcutaneous TID WC  . [MAR Hold] insulin glargine  10 Units Subcutaneous BID  . [MAR Hold] living well with diabetes book   Does not apply Once  . [MAR Hold] lurasidone  120 mg Oral QHS  . [MAR Hold] metoprolol tartrate  12.5 mg  Oral BID  . [MAR Hold] mometasone-formoterol  2 puff Inhalation BID  . [MAR Hold] multivitamin with minerals  1 tablet Oral Daily  . [MAR Hold] nicotine  21 mg Transdermal Daily  . [MAR Hold] OXcarbazepine  600 mg Oral BID  . [MAR Hold] oxyCODONE  10 mg Oral Q12H  . [MAR Hold] pantoprazole  40 mg Oral Daily  . [MAR Hold] polyethylene glycol  17 g Oral BID  . [MAR Hold] simvastatin  40 mg Oral Daily  . [MAR Hold] sucralfate  1 g Oral TID WC & HS   Continuous Infusions: . [MAR Hold] methocarbamol (ROBAXIN)  IV 500 mg (02/04/17 1441)  . [MAR Hold] piperacillin-tazobactam (ZOSYN)  IV 3.375 g (02/10/17 0526)     LOS: 8 days      Reyne Dumas, MD [facility] Pager [phone]  If 7PM-7AM, please contact night-coverage [email or web address] Password [email or web address] 02/10/2017, 8:00 AM

## 2017-02-10 NOTE — Op Note (Signed)
    Patient name: Kathy Phelps MRN: [id number] DOB: [birth year] Sex: female  02/10/2017 Pre-operative Diagnosis: gangrene of left toes 3-5 Post-operative diagnosis:  Same Surgeon:  Eda Paschal. Donzetta Matters, MD Assistant:  OR nurse Procedure Performed:  Amputation of left 3-5 toes with placement of negative pressure dressing  Indications:  36yo  female with recent history of gangrene of her left 3 through 5 toes. She is status post revascularization and is now indicated for the above operation.  Findings: Necrosis was limited to toes 3 through 5. At completion of amputation wound was too large for primary closure and negative pressure dressing was applied.    Procedure:  The patient was identified in the holding area and taken to the operating room where she was placed supine on the operating table and general endotracheal anesthesia was induced. She was sterilely prepped and draped in the left foot in the usual fashion and timeout called. We began with incision around the necrotic tissue of toes 3, 4 and 5. Dissected down to healthy tissue and transected the toes with bone cutter. We then obtained hemostasis with electrocautery and then injected a total of 15 mL of 1% lidocaine with epinephrine. Wound was irrigated. I then removed the metatarsal heads with rongeur and then smoothed them with rasp. There was adequate bleeding in the remaining tissue. I closed the tissue over the metatarsal heads with 2-0 Vicryl suture that was interrupted. Then placed a wound VAC to -125 suction. Patient was allowed awaken from anesthesia having tolerated the procedure well WERE correct at completion.    Sha Amer C. Donzetta Matters, MD [facility] [place] Office: [phone] Pager: [phone]

## 2017-02-11 ENCOUNTER — Inpatient Hospital Stay (HOSPITAL_COMMUNITY): Payer: Medicaid Other

## 2017-02-11 ENCOUNTER — Encounter (HOSPITAL_COMMUNITY): Payer: Self-pay | Admitting: Vascular Surgery

## 2017-02-11 LAB — GLUCOSE, CAPILLARY
GLUCOSE-CAPILLARY: 103 mg/dL — AB (ref 65–99)
Glucose-Capillary: 158 mg/dL — ABNORMAL HIGH (ref 65–99)
Glucose-Capillary: 87 mg/dL (ref 65–99)

## 2017-02-11 LAB — BASIC METABOLIC PANEL
Anion gap: 10 (ref 5–15)
BUN: 18 mg/dL (ref 6–20)
CALCIUM: 8.9 mg/dL (ref 8.9–10.3)
CO2: 30 mmol/L (ref 22–32)
CREATININE: 2.94 mg/dL — AB (ref 0.44–1.00)
Chloride: 96 mmol/L — ABNORMAL LOW (ref 101–111)
GFR calc Af Amer: 22 mL/min — ABNORMAL LOW (ref >60)
GFR, EST NON AFRICAN AMERICAN: 19 mL/min — AB (ref >60)
GLUCOSE: 185 mg/dL — AB (ref 65–99)
Potassium: 4.9 mmol/L (ref 3.5–5.1)
Sodium: 136 mmol/L (ref 135–145)

## 2017-02-11 MED ORDER — GLUCERNA SHAKE PO LIQD: 237.0000 mL | Freq: Three times a day (TID) | ORAL | 0 refills | Status: DC

## 2017-02-11 MED ORDER — PANTOPRAZOLE SODIUM 40 MG PO TBEC: 40.0000 mg | DELAYED_RELEASE_TABLET | Freq: Every day | ORAL | 0 refills | Status: DC

## 2017-02-11 MED ORDER — NICOTINE 21 MG/24HR TD PT24: 21.0000 mg | MEDICATED_PATCH | Freq: Every day | TRANSDERMAL | 0 refills | Status: DC

## 2017-02-11 MED ORDER — GABAPENTIN 800 MG PO TABS: 800.0000 mg | ORAL_TABLET | Freq: Two times a day (BID) | ORAL | Status: DC

## 2017-02-11 MED ORDER — GABAPENTIN 800 MG PO TABS: 800.0000 mg | ORAL_TABLET | Freq: Two times a day (BID) | ORAL | 0 refills | Status: AC

## 2017-02-11 MED ORDER — METOPROLOL TARTRATE 25 MG PO TABS: 12.5000 mg | ORAL_TABLET | Freq: Two times a day (BID) | ORAL | 0 refills | Status: DC

## 2017-02-11 MED ORDER — INSULIN GLARGINE 100 UNIT/ML ~~LOC~~ SOLN: 10.0000 [IU] | Freq: Two times a day (BID) | SUBCUTANEOUS | 11 refills | Status: DC

## 2017-02-11 MED ORDER — CLINDAMYCIN HCL 300 MG PO CAPS: 300.0000 mg | ORAL_CAPSULE | Freq: Three times a day (TID) | ORAL | 0 refills | Status: DC

## 2017-02-11 MED ORDER — CLOPIDOGREL BISULFATE 75 MG PO TABS: 75.0000 mg | ORAL_TABLET | Freq: Every day | ORAL | 0 refills | Status: DC

## 2017-02-11 MED ORDER — OXYCODONE HCL 5 MG PO TABS: 5.0000 mg | ORAL_TABLET | Freq: Three times a day (TID) | ORAL | 0 refills | Status: DC | PRN

## 2017-02-11 NOTE — Progress Notes (Addendum)
RN called to report an unwitnessed fall. RN states NT was in the room taking VS and just when NT stepped out, NT heard the pt say "ouch". NT went back into the room and found the pt sitting on her bottom near the bed.  Pt endorses left arm pain (left forearm). Per RN, pt can move left upper extremity without limitations, including her shoulder, elbow, wrist, hand, and fingers. There are no visible injuries, deformities, lacerations, bruising or contusions to LUE. RN states there are none of the above mentioned injuries to any other part of pt's body. RN states pt will not answer any further questions about the fall because "she wants to go home and doesn't want the doctor to know". Pt denied hitting her head. RN states pt gets up in spite of being advised not to get up alone and refuses to have her bed alarm activated. Per RN, this is her 2nd fall this hospitalization. Will check xray of left forearm and wrist.  KJKG, NP [facility] Immediately after above charting, RN called back and stated pt now says she hit her head when she fell. NP to bedside. S: Pt states, she got up to bedside commode and while pulling down her underwear, she fell forward and put her left upper extremity on the floor to catch her fall. Then, she went down to the floor, hit her head on the left, and ended up under the chair on her back. She endorses pain to the left middle finger to touch and movement. Also, discomfort to left wrist, forearm, and shoulder with movement. States the female in the room helped her up. He only speaks spanish and just endorses what she says about him assisting her to a sitting position. Pt speaks to him in [place]. During NP history taking, pt's story changes. In the beginning, she told NP that she got herself up to a sitting position. Later stated the female in the room got her up (literally had to "pull her out from under the chair" and assist her to a sitting position).  O: Well appearing WF in NAD. VSS. Ext: Left  3rd metacarpal is tender on palpation. Able to move fingers. Able to move wrist but endorses tenderness with palpation and discomfort with internal and external rotation of wrist, as well as flexion and extension. (tender to lateral wrist at old IV site). Able to move her left shoulder but when asked to reach overhead, she uses her right UE to aid her movement overhead. No tenderness to left shoulder on palpation. Noted pinkish discoloration to left 3rd metacarpal, but no redness, breaks in skin, bruising, swelling, or deformities on exam. No redness, swelling, lacerations to face.   Head: no noted tenderness on palpation of scalp. No lacerations, bleeding, swelling, hematoma. Neuro: Alert and oriented. Speech fluent and clear. Sensation intact throughout. No focal neuro deficit noted.  A/P: 1. Unwitnessed fall with incongruent story. Will xray left UE including hand, wrist, forearm and shoulder. Ice to left hand and wrist. Elevate extremity. Further treatment depends on Xray report. Will hold off now on CT head as no noted injury and neuro is intact. Order frequent neuro changes. Report to oncoming attending at [facility] told to NOT get OOB without assistance of staff and she agrees. KJKG, NP [facility]

## 2017-02-11 NOTE — Progress Notes (Signed)
Nurse tech called out for help to come into pt room after pt had fallen. States she was trying to get to [facility] independently and fell. Pt states she doesn't need help getting up and did not want the MD to be made aware. Upon arrival pt on floor sitting. Pt did not call to ask to be assisted to [facility] verbalized understanding of needing to do this from here on. Allowed me to now place her bed alarm. Pt states she on her left arm and complains of pain. "I think my arm is broken" stated the patient.  Baltazar Najjar on call paged. No telemetry changes. VSS. Baltazar Najjar put in STAT xray orders after coming to assess the patient. Pt resting in bed. Bed alarm on. Will continue to monitor

## 2017-02-11 NOTE — Care Management Note (Addendum)
Case Management Note  Patient Details  Name: Kathy Phelps MRN: [id number] Date of Birth: June 16, 1981  Subjective/Objective:  Pt presented for gangrene and cellulitis of left foot/ toes. Post amputation 02-10-17. Wound VAC applied. Pt previously from home with spouse and children. Per pt church members are watching her children at this time. Pt will not qualify for Home PT/OT due to [place] for Insurance and not having a qualifying diagnosis- Pt still states she wants to return home- refuses SNF.                 Action/Plan: CM did offer choice for [facility] Services. Agency List provided and pt chose [facility]- Referral provided to [facility]. SOC to begin within 24-48 hours post d/c. DME WC, BSC and RW to be delivered to room prior to d/c via [facility]. Wound VAC Application to be sent to [facility]. CM will continue to monitor for Wound VAC Approval/ Delivery.  CM will continue to monitor. Pt is aware that we have to wait for insurance approval before d/c.   Expected Discharge Date:                  Expected Discharge Plan:  [place] (Refusing SNF wants to be at home with children.)  In-House Referral:  Clinical Social Work  Discharge planning Services  CM Consult  Post Acute Care Choice:  Durable Medical Equipment, Home Health Choice offered to:  Patient  DME Arranged:  Walker rolling, 3-N-1, Programmer, multimedia DME Agency:  [place]:  RN [place] Agency:  [place]  Status of Service:  Completed, signed off  If discussed at [employer] of Stay Meetings, dates discussed:    Additional Comments: 1521 02-11-17 Jacqlyn Krauss, RN,BSN [phone] [facility] has VAC Settings and days for change. No further needs from this CM.    1500 02-11-17 Jacqlyn Krauss, [place] [phone] Staff RN to contact surgeon in regards to [facility] order for [facility] Change out orders and settings.    1458 02-11-17 Jacqlyn Krauss, RN,BSN [phone] Received call from [facility] and  pt is approved for Wound VAC. VAC to be delivered to hospital. Staff RN aware.    1334 02-11-17 Jacqlyn Krauss, RN,BSN [phone] CM did work with [employer] rep- Information to be faxed to [facility] for approval. Will await to see if pt is approved before d/c. [facility] wound VAC will need to be applied before d/c home. Possibly delivery by 7:00 pm. Staff RN Lauren aware.  Bethena Roys, RN 02/11/2017, 11:25 AM

## 2017-02-11 NOTE — Progress Notes (Signed)
Pt has been discharged home with family. IV and telemetry box removed. Pt discharged with foley, wound vac, 3 in 1, and wheelchair. Pt received discharge instructions and all questions were answered. Pt left with all of her belongings. Pt left the unit via wheelchair and was accompanied by pt's family.   Grant Fontana BSN, RN

## 2017-02-11 NOTE — Progress Notes (Addendum)
[facility] [place]  Subjective  - wants to go home as soon as possible   Objective (!) 127/95 96 98.1 F (36.7 C) (Oral) 18 98%  Intake/Output Summary (Last 24 hours) at 02/11/17 0805 Last data filed at 02/11/17 2423  Gross per 24 hour  Intake             1570 ml  Output             1905 ml  Net             -335 ml   Left forefoot wound vac in place suction working well this am Palpable left DP Lungs non labored breathing     Assessment/Planning: 02/03/2017 Pre-operative Diagnosis: critical left lower extremity ischemia Post-operative diagnosis:  Same Surgeon:  Erlene Quan C. Donzetta Matters, MD Procedure Performed: 1.  US guided cannulation of right common femoral artery 2.  Aortogram with bilateral lower extremity runoff 3.  Jetstream atherectomy of left popliteal artery 4.  Drug coated balloon angioplasty of left popliteal artery 5.  Plan balloon angioplasty of left anterior tibial artery  02/10/2017 Pre-operative Diagnosis: gangrene of left toes 3-5 Post-operative diagnosis:  Same Surgeon:  Erlene Quan C. Donzetta Matters, MD Assistant:  OR nurse Procedure Performed:  Amputation of left 3-5 toes with placement of negative pressure dressing  Plavix 75 mg  Daily Stable disposition from a vascular point of view F/U with Dr. Donzetta Matters in 2 weeks ABI's and wound check    Theda Sers [place], [place] 02/11/2017 8:05 AM --  Laboratory Lab Results:  Recent Labs  02/09/17 0417 02/10/17 0154  WBC 8.9 9.8  HGB 10.1* 9.7*  HCT 30.0* 30.3*  PLT 237 284   BMET  Recent Labs  02/10/17 0154 02/11/17 0203  NA 138 136  K 4.6 4.9  CL 97* 96*  CO2 32 30  GLUCOSE 115* 185*  BUN 17 18  CREATININE 2.92* 2.94*  CALCIUM 9.1 8.9    COAG Lab Results  Component Value Date   INR 1.09 02/10/2017   INR 0.96 02/03/2010   INR 1.0 04/25/2007   No results found for: PTT  I have interviewed patient with PA and agree with assessment and plan above. Will need hhc for wound vac  when discharged. Ambulated with post op shoe. f/u in a couple weeks with abi's for wound check. Should be ok to discontinue iv abx +/- po.   Muhammadali Ries C. Donzetta Matters, MD [facility] [place] Office: [phone] Pager: [phone]

## 2017-02-11 NOTE — Progress Notes (Addendum)
Physical Therapy Treatment Patient Details Name: Kathy Phelps MRN: [id number] DOB: 1981/03/05 Today's Date: 02/11/2017    History of Present Illness 36 y.o. 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 PMH consists of uncontrolled DM, +smoker, and moderate malnutrition, Bipolar, depression, renal disorder, schizophrenia      PT Comments    Pt reports fall again this am with painful left hand and xrays obtained. Educated pt for lack of weight bearing with left hand at this time and did not attempt RW use. Pt attempted standing with post op shoe but immediately rolled her ankle in standing with this shoe and demanded its removal. Attempted standing again with only sock on and pt able to tolerate standing and take small steps however unable to maintain weight on heel and returned to sitting. Pt educated for discharge recommendation but continues to state very adamantly that she plans to return home so her kids can sleep in the bed with her. Pt educated for high fall risk and need for assist with all mobility and reports she does not have 24 hr assist at home. Pt limited by cognition, balance and pain. Will continue to follow to progress function.    Follow Up Recommendations  SNF;Supervision/Assistance - 24 hour     Equipment Recommendations  Rolling walker with 5" wheels;Wheelchair cushion (measurements PT);Wheelchair (measurements PT);3in1 (PT)    Recommendations for Other Services       Precautions / Restrictions Precautions Precautions: Fall Precaution Comments: 2 falls acutely and >10 at home in the last year, VAC Restrictions LLE Weight Bearing: Partial weight bearing LLE Partial Weight Bearing Percentage or Pounds: on heel Other Position/Activity Restrictions: post op shoe    Mobility  Bed Mobility Overal bed mobility: Modified Independent                Transfers Overall  transfer level: Needs assistance   Transfers: Sit to/from Stand Sit to Stand: Min assist         General transfer comment: pt with anterior and posterior LOB in standing with RUE support. Pt with painful left hand and possible metacarpal fx so did not attempt RW use or weight bearing of left hand.   Ambulation/Gait Ambulation/Gait assistance: Mod assist Ambulation Distance (Feet): 1 Feet Assistive device: 2 person hand held assist Gait Pattern/deviations: Step-to pattern;Decreased stance time - left;Leaning posteriorly;Trunk flexed     General Gait Details: pt able to take 2 small steps forward and back with assist for balance and stability, unable to maintain weight on heel and unable to progress further with return to sitting   Stairs            Wheelchair Mobility    Modified Rankin (Stroke Patients Only)       Balance Overall balance assessment: Needs assistance   Sitting balance-Leahy Scale: Fair       Standing balance-Leahy Scale: Poor                              Cognition Arousal/Alertness: Awake/alert Behavior During Therapy: WFL for tasks assessed/performed Overall Cognitive Status: Impaired/Different from baseline Area of Impairment: Memory;Safety/judgement;Problem solving                 Orientation Level: Disoriented to;Time   Memory: Decreased short-term memory;Decreased recall of precautions   Safety/Judgement: Decreased awareness of safety;Decreased awareness of deficits   Problem Solving: Difficulty  sequencing General Comments: pt aware of need for assist but states she forgot and fell again this morning, hx of schizophrenia      Exercises General Exercises - Lower Extremity Long Arc Quad: AROM;15 reps;Both;Seated Hip Flexion/Marching: AROM;15 reps;Both;Seated    General Comments        Pertinent Vitals/Pain Pain Score: 5  Pain Location: left foot Pain Descriptors / Indicators: Aching Pain Intervention(s):  Limited activity within patient's tolerance;Repositioned    Home Living                      Prior Function            PT Goals (current goals can now be found in the care plan section) Progress towards PT goals: Progressing toward goals    Frequency    Min 3X/week      PT Plan Current plan remains appropriate    Co-evaluation              AM-PAC PT "6 Clicks" Daily Activity  Outcome Measure  Difficulty turning over in bed (including adjusting bedclothes, sheets and blankets)?: A Little Difficulty moving from lying on back to sitting on the side of the bed? : A Little Difficulty sitting down on and standing up from a chair with arms (e.g., wheelchair, bedside commode, etc,.)?: A Lot Help needed moving to and from a bed to chair (including a wheelchair)?: A Lot Help needed walking in hospital room?: A Lot Help needed climbing 3-5 steps with a railing? : A Lot 6 Click Score: 14    End of Session Equipment Utilized During Treatment: Gait belt Activity Tolerance: Patient limited by pain Patient left: in bed;with bed alarm set;with call bell/phone within reach Nurse Communication: Mobility status;Precautions;Weight bearing status PT Visit Diagnosis: Unsteadiness on feet (R26.81);Muscle weakness (generalized) (M62.81);Pain Pain - Right/Left: Left Pain - part of body: Ankle and joints of foot     Time: 8159-4707 PT Time Calculation (min) (ACUTE ONLY): 31 min  Charges:  $Therapeutic Exercise: 8-22 mins $Therapeutic Activity: 8-22 mins                    G Codes:       Elwyn Reach, PT [phone]   [place] 02/11/2017, 10:38 AM

## 2017-02-11 NOTE — Progress Notes (Signed)
   02/11/17 0528  What Happened  Was fall witnessed? No  Was patient injured? Unsure  Patient found on floor  Found by Staff-comment (nannette, NT)  Stated prior activity bathroom-unassisted  Follow Up  MD notified Baltazar Najjar (on call)  Time MD notified [place] notified Yes-comment (boyfriend present in room)  Simple treatment Ice  Adult Fall Risk Assessment  Risk Factor Category (scoring not indicated) Fall has occurred during this admission (document High fall risk)  Patient's Fall Risk High Fall Risk (>13 points)  Adult Fall Risk Interventions  Required Bundle Interventions *See Row Information* High fall risk - low, moderate, and high requirements implemented  Additional Interventions Family Supervision;Use of appropriate toileting equipment (bedpan, BSC, etc.)  Screening for Fall Injury Risk  Risk For Fall Injury- See Row Information  None identified  Screening for Fall Injury Risk Interventions  Additional Interventions (pt refuses asstance and alarm)  Vitals  Temp 98.1 F (36.7 C)  Temp Source Oral  BP (!) 127/95  BP Location Right Arm  BP Method Automatic  Patient Position (if appropriate) Sitting  Pulse Rate 96  Pulse Rate Source Dinamap  Resp 18  Oxygen Therapy  SpO2 100 %  O2 Device Room Air  Pain Assessment  Pain Assessment 0-10  Pain Score 10  Pain Type Acute pain  Pain Location Arm  Pain Orientation Left  Pain Descriptors / Indicators Aching  Pain Intervention(s) Medication (See eMAR)  PCA/Epidural/Spinal Assessment  Respiratory Pattern Regular;Unlabored  Neurological  Neuro (WDL) WDL  Level of Consciousness Alert  Orientation Level Oriented X4  Cognition Appropriate at baseline  Speech Clear  Pupil Assessment  No  LLE Motor Response Purposeful movement;No movement to painful stimulus  LLE Sensation Pain  Neuro Symptoms None  Neuro Additional Assessments No  Musculoskeletal  Musculoskeletal (WDL) X  Assistive Device BSC  Generalized  Weakness Yes  Weight Bearing Restrictions Yes  LLE Weight Bearing NWB  Integumentary  Integumentary (WDL) X  RN Assisting with Skin Assessment on Admission LB  Skin Color Appropriate for ethnicity;Pink  Skin Condition Dry  Skin Integrity Surgical Incision (see LDA)  Skin Turgor Non-tenting  Pain Assessment  Date Pain First Started 02/11/17 (left arm)  Result of Injury Yes  Pain Assessment  Work-Related Injury No

## 2017-02-11 NOTE — Progress Notes (Signed)
Occupational Therapy Treatment Patient Details Name: Kathy Phelps MRN: [id number] DOB: 1980-11-09 Today's Date: 02/11/2017    History of present illness 36 y.o. 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 PMH consists of uncontrolled DM, +smoker, and moderate malnutrition, Bipolar, depression, renal disorder, schizophrenia     OT comments  SPT to 3:1 commode. Pt is not good with PWB on heel. Continued to reinforce importance of this  Follow Up Recommendations  Supervision/Assistance - 24 hour (refusing snf)    Equipment Recommendations  3 in 1 bedside commode    Recommendations for Other Services      Precautions / Restrictions Precautions Precautions: Fall Restrictions LLE Weight Bearing: Partial weight bearing LLE Partial Weight Bearing Percentage or Pounds: on heel Other Position/Activity Restrictions: refusing post op shoe.  Also pt is not able to tolerate weight through LUE       Mobility Bed Mobility Overal bed mobility: Modified Independent (supervision)             General bed mobility comments: managed lines  Transfers       Sit to Stand: Min assist         General transfer comment: +2 for safety for SPT. Assist to rise and steady    Balance     Sitting balance-Leahy Scale: Fair       Standing balance-Leahy Scale: Poor                             ADL either performed or assessed with clinical judgement   ADL                           Toilet Transfer: Minimal assistance;+2 for safety/equipment;BSC;RW;Stand-pivot   Toileting- Clothing Manipulation and Hygiene: Set up;Sitting/lateral lean         General ADL Comments: pt refusing post op shoe; noted she twisted ankle earlier with PT.  She is unable to only weight bear on heel, and she cannot laterally transfer over as she is not placing weight on L hand.  Pt plans d/c home  tonight. She has 3 daughters, 74, 8, and 15 y.o.  Educated her only perform SPT to w/c and BSC and to try to weight bear only through L heel as this is her precaution for healing     Vision       Perception     Praxis      Cognition Arousal/Alertness: Awake/alert Behavior During Therapy: WFL for tasks assessed/performed Overall Cognitive Status: Impaired/Different from baseline                       Memory: Decreased short-term memory;Decreased recall of precautions   Safety/Judgement: Decreased awareness of safety;Decreased awareness of deficits   Problem Solving: Difficulty sequencing          Exercises     Shoulder Instructions       General Comments      Pertinent Vitals/ Pain       Pain Score: 5  Pain Location: left foot Pain Descriptors / Indicators: Aching Pain Intervention(s): Limited activity within patient's tolerance;Monitored during session;Repositioned  Home Living  Prior Functioning/Environment              Frequency  Min 2X/week        Progress Toward Goals  OT Goals(current goals can now be found in the care plan section)  Progress towards OT goals: Progressing toward goals     Plan  (pt refusing SNF; does not qualify for [facility] therapies)    Co-evaluation                 AM-PAC PT "6 Clicks" Daily Activity     Outcome Measure   Help from another person eating meals?: None Help from another person taking care of personal grooming?: A Little Help from another person toileting, which includes using toliet, bedpan, or urinal?: A Little Help from another person bathing (including washing, rinsing, drying)?: A Lot Help from another person to put on and taking off regular upper body clothing?: A Little Help from another person to put on and taking off regular lower body clothing?: A Little 6 Click Score: 18    End of Session    OT Visit Diagnosis:  Unsteadiness on feet (R26.81);Other abnormalities of gait and mobility (R26.89);Repeated falls (R29.6);Muscle weakness (generalized) (M62.81)   Activity Tolerance Patient tolerated treatment well   Patient Left in bed;with call bell/phone within reach;with bed alarm set   Nurse Communication  (RN stood by)        Time: 8366-2947 OT Time Calculation (min): 13 min  Charges: OT General Charges $OT Visit: 1 Procedure OT Treatments $Self Care/Home Management : 8-22 mins  Kathy Phelps, OTR/L [phone] 02/11/2017   Kathy Phelps 02/11/2017, 4:26 PM

## 2017-02-13 NOTE — Discharge Summary (Signed)
Physician Discharge Summary  Kathy Phelps ZYS:[id number] DOB: 05-24-1981 DOA: 02/01/2017  PCP: Rubbie Battiest, NP  Admit date: 02/01/2017 Discharge date: 02/11/2017  Admitted From: Home Disposition:  Home.  Recommendations for Outpatient Follow-up:  1. Follow up with PCP in 1-2 weeks 2. Please obtain BMP/CBC in one week 3. Please follow up with Dr Donzetta Matters as recommended.  4. Please follow up with PCP if pain reoccurs in the left wrist.  5. Please follow up with ENT as outpatient in one week.  6. Please follow up with [facility] for voiding trial in 1 week.    Home Health:yes   Discharge Condition:Guarded.  CODE STATUS: full code.  Diet recommendation: Heart Healthy  Brief/Interim Summary: 36 yo female who presented to the hospital with the chief complain of left foot pain. Worsening symptoms for the last 4 weeks, refractive to outpatient oral antibiotic therapy. Initial evaluation patient found tachycardic. Left foot with significant erythema. Admitted for cellulitis and critical left lower extremity ischemia. S/p Aortogram with bilateral lower extremity runoff, 4/26. ON 5/3  for amputation of left 3-5 toes by Dr Donzetta Matters.  Discharge Diagnoses:  Principal Problem:   Cellulitis of left foot Active Problems:   Uncontrolled type 2 diabetes mellitus with hyperglycemia (HCC)   HLD (hyperlipidemia)   Tobacco abuse   Essential hypertension   Malnutrition of moderate degree   AKI (acute kidney injury) ([place])   Cellulitis   Edema  Acute kidney injury, creatinine went from 0.87>3.53>4.29>3.7>3.49>3.03>2.83>2.92> 2.94 -now improving Urine output  3525>2850 >3550  cc last 24 hrs  multi-factorial with ischemic ATN, contrast nephropathy and possible papillary necrosis from NSAIDs.Urinary retention due to neurogenic bladder is likely contributing, Renal US with mildly increased cortical echogenicity of both kidneys and mild right hydronephrosis Blood pressure soft post procedure 4/26  , now stable  Patient likely in the recovery phase of ATN with a significant diuresis. Nephrology has signed off Continue to follow renal function closely as outpatient with PCP .      Left foot cellulitis with possible gangrene.  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 Holding vancomycin given acute kidney injury.  Left ABI indicated moderate peripheral arterial disease, status post aortogram/balloon angioplasty by vascular 4/26. .  Buddy Duty on fentanyl PCA by Dr. Scot Dock for uncontrolled pain. Fentanyl PCA discontinued 4/29 due to somnolence  and respiratory depression, requiring Narcan. Patient has now been transitioned to low dose OxyContin, oxycodone, .  Dr. Donzetta Matters will make recommendations about wound care .Follow culture . Vancomycin held 4/29 due to AKI.  Discharged on oral antibiotics to complete the course.   T2DM. Uncontrolled-noted to be hypoglycemic in the setting of recent change in renal function, resumed Lantus at a lower dose , hemoglobin A1c  11.8. Now requiring periodic adjustments in Lantus dosing gabapentin reduced secondary to acute kidney injury Decrease dose of Lantus to 10 units twice a day and monitor.   HTN. Continue blood pressure control with metoprolol   Tobacco abuse,on  nicotine patch  Dyslipidemia. Continue statin therapy with simvastatin.   Bipolar disorder/depression/anxiety/schizophrenia. Lot of psychosocial stressors,  Continue trileptal. latuda and alprazolam.    Right submandibular mass.   soft tissue ultrasound showed ill-defined hypoechoic mass in the submandibular region measuring 2.5 x 1.3 x 2.0 cm. CT head and neck -1.7 cm right submandibular mass, possibly infectious, neoplasm cannot be ruled out, will need outpatient oral surgery evaluation. Recommended outpatient ENT evaluation.   Urinary retention 2/2 to neurogenic bladder with uncontrolled  diabetes. Renal US with mild right hydronephrosis. Patient states that  previously Flomax has not worked, continue indwelling Foley. Will need urology follow-up   Fall on 5/3; Patient reportedly fell on the left side, she denies hitting her head. She denies any pain in the left wrist. She says she is able to use the left arm and forearm without any issues.  X rays do not show definite fracture. She was recommended to see PCP or come to ED if she has persistent pain .          Discharge Instructions  Discharge Instructions    Diet - low sodium heart healthy    Complete by:  As directed    Discharge instructions    Complete by:  As directed    Please follow up with PCP in one week, please follow up with vascular, Dr Donzetta Matters as recommended.   Increase activity slowly    Complete by:  As directed      Allergies as of 02/11/2017      Reactions   Hydrocodone-acetaminophen Hives   Tylenol [acetaminophen] Itching, Swelling   SWELLING REACTION UNSPECIFIED       Medication List    STOP taking these medications   ASPIRIN LOW DOSE 81 MG EC tablet Generic drug:  aspirin   naproxen 500 MG tablet Commonly known as:  NAPROSYN     TAKE these medications   ADVAIR DISKUS 100-50 MCG/DOSE Aepb Generic drug:  Fluticasone-Salmeterol Inhale 1 puff into the lungs daily.   alprazolam 2 MG tablet Commonly known as:  XANAX Take 2 mg by mouth 2 (two) times daily.   clindamycin 300 MG capsule Commonly known as:  CLEOCIN Take 1 capsule (300 mg total) by mouth 3 (three) times daily.   clopidogrel 75 MG tablet Commonly known as:  PLAVIX Take 1 tablet (75 mg total) by mouth daily with breakfast.   feeding supplement (GLUCERNA SHAKE) Liqd Take 237 mLs by mouth 3 (three) times daily between meals.   gabapentin 800 MG tablet Commonly known as:  NEURONTIN Take 1 tablet (800 mg total) by mouth 2 (two) times daily. What changed:  when to take this   insulin glargine 100 UNIT/ML injection Commonly known as:  LANTUS Inject 0.1 mLs (10 Units total) into the skin 2  (two) times daily. What changed:  how much to take   LATUDA 120 MG Tabs Generic drug:  Lurasidone HCl Take 120 mg by mouth at bedtime.   metFORMIN 1000 MG tablet Commonly known as:  GLUCOPHAGE Take 1 tablet (1,000 mg total) by mouth 2 (two) times daily. What changed:  how much to take   metoprolol tartrate 25 MG tablet Commonly known as:  LOPRESSOR Take 0.5 tablets (12.5 mg total) by mouth 2 (two) times daily. What changed:  how much to take   nicotine 21 mg/24hr patch Commonly known as:  NICODERM CQ - dosed in mg/24 hours Place 1 patch (21 mg total) onto the skin daily.   NOVOLOG 100 UNIT/ML injection Generic drug:  insulin aspart Inject 0-30 Units into the skin 3 (three) times daily with meals. Sliding Scale:  >300 15 units < 300 do not use   oxcarbazepine 600 MG tablet Commonly known as:  TRILEPTAL Take 600 mg by mouth 2 (two) times daily.   oxyCODONE 5 MG immediate release tablet Commonly known as:  ROXICODONE Take 1 tablet (5 mg total) by mouth every 8 (eight) hours as needed for severe pain. What changed:  when to take  this   pantoprazole 40 MG tablet Commonly known as:  PROTONIX Take 1 tablet (40 mg total) by mouth daily.   PROAIR HFA 108 (90 Base) MCG/ACT inhaler Generic drug:  albuterol Inhale 2 puffs into the lungs every 6 (six) hours as needed for shortness of breath.   simvastatin 40 MG tablet Commonly known as:  ZOCOR Take 40 mg by mouth daily.   tamsulosin 0.4 MG Caps capsule Commonly known as:  FLOMAX Take 0.4 mg by mouth daily.   zolpidem 10 MG tablet Commonly known as:  AMBIEN Take 10 mg by mouth at bedtime as needed for sleep.      Follow-up Information    Rubbie Battiest, NP. Schedule an appointment as soon as possible for a visit in 1 week(s).   Specialty:  Nurse Practitioner Contact information: [place] [address] [phone]        Melida Quitter, MD. Schedule an appointment as soon as possible  for a visit in 1 week(s).   Specialty:  Otolaryngology Contact information: [address] [place] [address] [phone]        [facility], [facility]. Schedule an appointment as soon as possible for a visit in 1 week(s).   Contact information: [place] [address] [phone]        Waynetta Sandy, MD. Schedule an appointment as soon as possible for a visit in 2 week(s).   Specialties:  Vascular Surgery, Cardiology Why:  FOLLOW UP TO CHECK ON WOUND AND ABI'S Contact information: [place] [address] [facility] Follow up.   Why:  Registered Nurse Contact information: [address] [place] [address] [phone]        [name] Follow up.   Why:  Bedside Commode, Wheel chair with cushion, Rolling Walker.  Contact information: [place] [address] [phone]          Allergies  Allergen Reactions  . Hydrocodone-Acetaminophen Hives  . Tylenol [Acetaminophen] Itching and Swelling    SWELLING REACTION UNSPECIFIED     Consultations:  Vascular consult.    Procedures/Studies: Dg Forearm Left  Result Date: 02/11/2017 CLINICAL DATA:  Initial evaluation for acute pain status post recent fall. EXAM: LEFT FOREARM - 2 VIEW COMPARISON:  None. FINDINGS: There is no evidence of fracture or other focal bone lesions. Soft tissues are unremarkable. IMPRESSION: No acute osseous abnormality about the left forearm. Electronically Signed   By: Jeannine Boga M.D.   On: 02/11/2017 07:03   Dg Wrist 2 Views Left  Result Date: 02/11/2017 CLINICAL DATA:  Initial evaluation for acute pain status post fall. EXAM: LEFT WRIST - 2 VIEW COMPARISON:  None.  Prior radiograph from 02/11/2012. FINDINGS: There is a possible linear lucency at the left a third metacarpal head, seen only on AP projection of this  exam. While this finding may be projectional, a possible subtle nondisplaced fracture is not entirely excluded. No other acute fracture or dislocation about the wrist. Normal radiocarpal and distal radioulnar articulations intact. No appreciable soft tissue swelling. IMPRESSION: 1. Apparent linear lucency extending through the left third metacarpal head, incompletely evaluated on this exam. While this finding may be projectional, a possible subtle nondisplaced fracture is not entirely excluded. Correlation with physical exam for tenderness at this location recommended. Additionally, further evaluation with dedicated radiograph of  the left hand would likely be helpful as well. 2. No other acute osseous abnormality about the left wrist. Electronically Signed   By: Jeannine Boga M.D.   On: 02/11/2017 07:12   Ct Soft Tissue Neck Wo Contrast  Result Date: 02/05/2017 CLINICAL DATA:  Pain and swelling of the right side of the neck/face for 1 month. EXAM: CT NECK WITHOUT CONTRAST TECHNIQUE: Multidetector CT imaging of the neck was performed following the standard protocol without intravenous contrast. COMPARISON:  None. FINDINGS: Pharynx and larynx: No mucosal or submucosal lesion. Salivary glands: Parotid and submandibular glands are normal. Thyroid: Normal Lymph nodes: No enlarged or low-density nodes on either side of the neck. Slightly prominent right submandibular nodes. See below. Vascular: Normal without contrast Limited intracranial: Normal Visualized orbits: Normal Mastoids and visualized paranasal sinuses: Clear Skeleton: Dental decay and periodontal disease extensively. Upper chest: Emphysema without dominant bullous disease. Other: In the region of concern, just inferior to the right body of the mandible, there is an indistinct region of increased density within the subcutaneous fat measuring about 1.7 cm in diameter. This also involves the platysma muscle and shows some inflammation extending just  deep to the platysma muscle within the submandibular space. This is presumed represent infectious inflammation. No sign of radiopaque foreign object. Adjacent submandibular nodes are slightly prominent. IMPRESSION: 1.7 cm in diameter region of indistinct increased density in the right submandibular region with the epicenter in the subcutaneous fat but with involvement of the platysma muscle and mild extension into the submandibular space. This is most consistent with nonspecific infectious inflammation. Mild reactive enlargement of the adjacent submandibular lymph nodes. No involvement of the submandibular gland itself. No evidence of radiopaque foreign object. Soft tissue neoplasm is not excluded, but seems much less likely than inflammatory disease. Dental decay and periodontal disease. Electronically Signed   By: Nelson Chimes M.D.   On: 02/05/2017 13:11   US Renal  Result Date: 02/04/2017 CLINICAL DATA:  Acute kidney injury EXAM: RENAL / URINARY TRACT ULTRASOUND COMPLETE COMPARISON:  CT 05/23/2016 FINDINGS: Right Kidney: Length: 11.4 cm. Slight increased cortical echogenicity. Mild right hydronephrosis. No mass. Left Kidney: Length: 12.4 cm. Slight increased cortical echogenicity. No hydronephrosis or focal abnormality. Bladder: Foley catheter present in the bladder. IMPRESSION: 1. Mild increased cortical echogenicity of both kidneys, there is mild right hydronephrosis. 2. Foley catheter present in the empty urinary bladder. Electronically Signed   By: Donavan Foil M.D.   On: 02/04/2017 17:52   Mr Foot Left Wo Contrast  Result Date: 02/02/2017 CLINICAL DATA:  Increasing pain and discoloration of left foot lateral 3 toes, gangrenous for 2 days. Patient states the symptoms started with lateral most toe a month ago. EXAM: MRI OF THE LEFT FOOT WITHOUT CONTRAST TECHNIQUE: Multiplanar, multisequence MR imaging of the left forefoot was performed. No intravenous contrast was administered. COMPARISON:  None.  FINDINGS: Bones/Joint/Cartilage No fracture or dislocation. Normal alignment. No joint effusion. Mild osteoarthritis of the first MTP joint. Ligaments Ligaments are suboptimally evaluated by CT. Lisfranc ligament intact. Muscles and Tendons Visualized flexor and extensor compartment tendons are intact. Edema in the plantar musculature which may be neurogenic given the patient's history of diabetes versus less likely myositis. Soft tissue No fluid collection or hematoma. No soft tissue mass. Mild soft tissue edema along the dorsal aspect of the forefoot. IMPRESSION: 1. No evidence of osteomyelitis of the left foot. 2. Mild soft tissue edema along the dorsal aspect of the forefoot as can be seen with mild  cellulitis. Electronically Signed   By: Kathreen Devoid   On: 02/02/2017 08:52   Dg Shoulder Left  Result Date: 02/11/2017 CLINICAL DATA:  Initial evaluation for acute pain status post fall. EXAM: LEFT SHOULDER - 2+ VIEW COMPARISON:  None. FINDINGS: No acute fracture dislocation. Humeral head in normal line with the glenoid. AC joint approximated. Clavicle intact. No periarticular calcification. Visualized left hemithorax is clear. IMPRESSION: No acute osseous abnormality about the left shoulder. Electronically Signed   By: Jeannine Boga M.D.   On: 02/11/2017 07:07   Dg Foot Complete Left  Result Date: 02/01/2017 CLINICAL DATA:  LEFT fifth toe pain for 3 weeks, gangrenous for 2 days. EXAM: LEFT FOOT - COMPLETE 3+ VIEW COMPARISON:  LEFT foot radiographs January 10, 2017 FINDINGS: No acute fracture deformity or dislocation. No destructive bony lesions. Soft tissue planes are not suspicious. IMPRESSION: Negative. Electronically Signed   By: Elon Alas M.D.   On: 02/01/2017 22:55   US Soft Tissue Neck  Result Date: 02/02/2017 CLINICAL DATA:  Right submandibular mass EXAM: ULTRASOUND OF HEAD/NECK SOFT TISSUES TECHNIQUE: Ultrasound examination of the head and neck soft tissues was performed in the area  of clinical concern. COMPARISON:  None. FINDINGS: Imaging in the region of the palpable abnormality corresponds to an ill-defined hypoechoic mass in the submandibular region measuring 2.5 x 1.3 x 2.0 cm. Color Doppler imaging demonstrates moderate internal vascularity. IMPRESSION: The palpable abnormality corresponds to an ill-defined hypoechoic mass with vascularity. Malignancy is not excluded. CT neck with contrast is recommended. Electronically Signed   By: Marybelle Killings M.D.   On: 02/02/2017 15:28    Amputation of the left toes by Dr Donzetta Matters on 5 /3   Subjective: Wants to go home, denies any pain , chest pain or sob or headache.   Discharge Exam: Vitals:   02/11/17 0528 02/11/17 1303  BP: (!) 127/95 (!) 151/83  Pulse: 96 91  Resp: 18 18  Temp: 98.1 F (36.7 C) 98.2 F (36.8 C)   Vitals:   02/11/17 0520 02/11/17 0528 02/11/17 0752 02/11/17 1303  BP: 122/85 (!) 127/95  (!) 151/83  Pulse: 89 96  91  Resp: 18 18  18   Temp: 98.1 F (36.7 C) 98.1 F (36.7 C)  98.2 F (36.8 C)  TempSrc: Oral Oral  Oral  SpO2: 93% 100% 98% 99%  Weight:      Height:        General: Pt is alert, awake, not in acute distress Cardiovascular: RRR, S1/S2 +, no rubs, no gallops Respiratory: CTA bilaterally, no wheezing, no rhonchi Abdominal: Soft, NT, ND, bowel sounds + Extremities: no edema, no cyanosis    The results of significant diagnostics from this hospitalization (including imaging, microbiology, ancillary and laboratory) are listed below for reference.     Microbiology: No results found for this or any previous visit (from the past 240 hour(s)).   Labs: BNP (last 3 results) No results for input(s): BNP in the last 8760 hours. Basic Metabolic Panel:  Recent Labs Lab 02/07/17 0204 02/08/17 0211 02/09/17 0417 02/10/17 0154 02/11/17 0203  NA 138 136 137 138 136  K 4.8 4.1 4.8 4.6 4.9  CL 102 105 104 97* 96*  CO2 28 24 23  32 30  GLUCOSE 170* 79 74 115* 185*  BUN 19 16 17 17 18    CREATININE 3.49* 3.03* 2.83* 2.92* 2.94*  CALCIUM 9.3 8.7* 8.8* 9.1 8.9  PHOS 5.6*  --   --   --   --  Liver Function Tests:  Recent Labs Lab 02/07/17 0204 02/08/17 0211  AST  --  16  ALT  --  10*  ALKPHOS  --  120  BILITOT  --  0.5  PROT  --  6.0*  ALBUMIN 2.2* 2.0*   No results for input(s): LIPASE, AMYLASE in the last 168 hours. No results for input(s): AMMONIA in the last 168 hours. CBC:  Recent Labs Lab 02/07/17 0204 02/08/17 0211 02/09/17 0417 02/10/17 0154  WBC 10.0 8.9 8.9 9.8  HGB 10.2* 9.3* 10.1* 9.7*  HCT 30.9* 28.4* 30.0* 30.3*  MCV 90.6 90.2 89.8 90.4  PLT 227 221 237 284   Cardiac Enzymes: No results for input(s): CKTOTAL, CKMB, CKMBINDEX, TROPONINI in the last 168 hours. BNP: Invalid input(s): POCBNP CBG:  Recent Labs Lab 02/10/17 1640 02/10/17 2127 02/11/17 0655 02/11/17 1119 02/11/17 1616  GLUCAP 246* 207* 158* 87 103*   D-Dimer No results for input(s): DDIMER in the last 72 hours. Hgb A1c No results for input(s): HGBA1C in the last 72 hours. Lipid Profile No results for input(s): CHOL, HDL, LDLCALC, TRIG, CHOLHDL, LDLDIRECT in the last 72 hours. Thyroid function studies No results for input(s): TSH, T4TOTAL, T3FREE, THYROIDAB in the last 72 hours.  Invalid input(s): FREET3 Anemia work up No results for input(s): VITAMINB12, FOLATE, FERRITIN, TIBC, IRON, RETICCTPCT in the last 72 hours. Urinalysis    Component Value Date/Time   COLORURINE YELLOW 02/04/2017 1655   APPEARANCEUR HAZY (A) 02/04/2017 1655   LABSPEC 1.019 02/04/2017 1655   PHURINE 5.0 02/04/2017 1655   GLUCOSEU NEGATIVE 02/04/2017 1655   HGBUR MODERATE (A) 02/04/2017 1655   BILIRUBINUR NEGATIVE 02/04/2017 1655   KETONESUR NEGATIVE 02/04/2017 1655   PROTEINUR NEGATIVE 02/04/2017 1655   UROBILINOGEN 1.0 02/26/2015 1152   NITRITE NEGATIVE 02/04/2017 1655   LEUKOCYTESUR NEGATIVE 02/04/2017 1655   Sepsis Labs Invalid input(s): PROCALCITONIN,  WBC,   LACTICIDVEN Microbiology No results found for this or any previous visit (from the past 240 hour(s)).   Time coordinating discharge: Over 30 minutes  SIGNED:   Hosie Poisson, MD  [facility] 02/13/2017, 2:51 PM Pager   If 7PM-7AM, please contact night-coverage [email or web address] Password [email or web address]

## 2017-02-15 LAB — ECHOCARDIOGRAM COMPLETE
Height: 69 in
Weight: 2160 oz

## 2017-02-21 ENCOUNTER — Emergency Department (HOSPITAL_COMMUNITY): Payer: Medicaid Other

## 2017-02-21 ENCOUNTER — Other Ambulatory Visit: Payer: Self-pay | Admitting: *Deleted

## 2017-02-21 ENCOUNTER — Encounter (HOSPITAL_COMMUNITY): Payer: Medicaid Other

## 2017-02-21 ENCOUNTER — Encounter (HOSPITAL_COMMUNITY): Payer: Self-pay | Admitting: *Deleted

## 2017-02-21 ENCOUNTER — Emergency Department (HOSPITAL_COMMUNITY)
Admission: EM | Admit: 2017-02-21 | Discharge: 2017-02-21 | Discharge status: Against Medical Advice | Payer: Medicaid Other | Attending: Emergency Medicine | Admitting: Emergency Medicine

## 2017-02-21 DIAGNOSIS — Z794 Long term (current) use of insulin: Secondary | ICD-10-CM | POA: Insufficient documentation

## 2017-02-21 DIAGNOSIS — E119 Type 2 diabetes mellitus without complications: Secondary | ICD-10-CM | POA: Insufficient documentation

## 2017-02-21 DIAGNOSIS — I739 Peripheral vascular disease, unspecified: Secondary | ICD-10-CM

## 2017-02-21 DIAGNOSIS — Z79899 Other long term (current) drug therapy: Secondary | ICD-10-CM | POA: Diagnosis not present

## 2017-02-21 DIAGNOSIS — F17210 Nicotine dependence, cigarettes, uncomplicated: Secondary | ICD-10-CM | POA: Insufficient documentation

## 2017-02-21 DIAGNOSIS — R079 Chest pain, unspecified: Secondary | ICD-10-CM | POA: Diagnosis present

## 2017-02-21 DIAGNOSIS — I10 Essential (primary) hypertension: Secondary | ICD-10-CM | POA: Insufficient documentation

## 2017-02-21 LAB — CBC WITH DIFFERENTIAL/PLATELET
BASOS ABS: 0.1 10*3/uL (ref 0.0–0.1)
Basophils Relative: 0 %
EOS ABS: 0.5 10*3/uL (ref 0.0–0.7)
Eosinophils Relative: 4 %
HCT: 29.6 % — ABNORMAL LOW (ref 36.0–46.0)
HEMOGLOBIN: 10 g/dL — AB (ref 12.0–15.0)
LYMPHS ABS: 3.4 10*3/uL (ref 0.7–4.0)
LYMPHS PCT: 29 %
MCH: 30.6 pg (ref 26.0–34.0)
MCHC: 33.8 g/dL (ref 30.0–36.0)
MCV: 90.5 fL (ref 78.0–100.0)
Monocytes Absolute: 0.6 10*3/uL (ref 0.1–1.0)
Monocytes Relative: 5 %
NEUTROS PCT: 62 %
Neutro Abs: 7.4 10*3/uL (ref 1.7–7.7)
Platelets: 321 10*3/uL (ref 150–400)
RBC: 3.27 MIL/uL — AB (ref 3.87–5.11)
RDW: 13.5 % (ref 11.5–15.5)
WBC: 11.9 10*3/uL — AB (ref 4.0–10.5)

## 2017-02-21 LAB — COMPREHENSIVE METABOLIC PANEL
ALT: 6 U/L — ABNORMAL LOW (ref 14–54)
AST: 12 U/L — AB (ref 15–41)
Albumin: 2.7 g/dL — ABNORMAL LOW (ref 3.5–5.0)
Alkaline Phosphatase: 71 U/L (ref 38–126)
Anion gap: 9 (ref 5–15)
BILIRUBIN TOTAL: 0.5 mg/dL (ref 0.3–1.2)
BUN: 19 mg/dL (ref 6–20)
CO2: 21 mmol/L — ABNORMAL LOW (ref 22–32)
Calcium: 8.7 mg/dL — ABNORMAL LOW (ref 8.9–10.3)
Chloride: 106 mmol/L (ref 101–111)
Creatinine, Ser: 2.07 mg/dL — ABNORMAL HIGH (ref 0.44–1.00)
GFR, EST AFRICAN AMERICAN: 34 mL/min — AB (ref >60)
GFR, EST NON AFRICAN AMERICAN: 29 mL/min — AB (ref >60)
Glucose, Bld: 306 mg/dL — ABNORMAL HIGH (ref 65–99)
POTASSIUM: 4.1 mmol/L (ref 3.5–5.1)
Sodium: 136 mmol/L (ref 135–145)
Total Protein: 6.6 g/dL (ref 6.5–8.1)

## 2017-02-21 LAB — CBG MONITORING, ED: Glucose-Capillary: 233 mg/dL — ABNORMAL HIGH (ref 65–99)

## 2017-02-21 LAB — I-STAT BETA HCG BLOOD, ED (MC, WL, AP ONLY): I-stat hCG, quantitative: <5 m[IU]/mL (ref <5)

## 2017-02-21 LAB — TROPONIN I: Troponin I: <0.03 ng/mL (ref <0.03)

## 2017-02-21 LAB — D-DIMER, QUANTITATIVE (NOT AT ARMC): D DIMER QUANT: 0.94 ug{FEU}/mL — AB (ref 0.00–0.50)

## 2017-02-21 MED ORDER — ASPIRIN 81 MG PO CHEW
324.0000 mg | CHEWABLE_TABLET | Freq: Once | ORAL | Status: DC
  Filled 2017-02-21: qty 4

## 2017-02-21 MED ORDER — NITROGLYCERIN 0.4 MG SL SUBL: 0.4000 mg | SUBLINGUAL_TABLET | SUBLINGUAL | Status: DC | PRN

## 2017-02-21 MED ORDER — FENTANYL CITRATE (PF) 100 MCG/2ML IJ SOLN
50.0000 ug | Freq: Once | INTRAMUSCULAR | Status: AC
  Administered 2017-02-21: 50 ug via INTRAVENOUS
  Filled 2017-02-21: qty 2

## 2017-02-21 NOTE — ED Notes (Signed)
Got patient on the monitor undress into a gown patient is resting with family at bedside

## 2017-02-21 NOTE — ED Provider Notes (Signed)
[facility] DEPT Provider Note   CSN: [id number] Arrival date & time: 02/21/17  1107     History   Chief Complaint Chief Complaint  Patient presents with  . Chest Pain    HPI Kathy Phelps is a 36 y.o. female.  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 Patient states that this morning she was doing her normal thing and she started having a sharp left-sided chest pain that radiates to her back. No associated symptoms except for some intermittent dizziness. Not associated exertion. EMS brought her in and states that she did not have any ST changes did have some relief with nitroglycerin but the pain seems to be returning. Patient with no other complaints at this time. No other associated modifying factors. No history of the same. Does smoke, have HTN, HLD, DM and schizophrenia.   Past Medical History:  Diagnosis Date  . Bipolar affective ([place])   . Complete miscarriage   . Depression   . Diabetes mellitus   . Hyperlipidemia   . Hypertension   . Pregnancy complication    HELP Syndrome  . Renal disorder   . Schizophrenia [facility])     Patient Active Problem List   Diagnosis Date Noted  . Edema   . Cellulitis   . AKI (acute kidney injury) ([place]) 02/04/2017  . Cellulitis of left foot 02/02/2017  . Uncontrolled type 2 diabetes mellitus with hyperglycemia ([place]) 02/02/2017  . HLD (hyperlipidemia) 02/02/2017  . Tobacco abuse 02/02/2017  . Essential hypertension 02/02/2017  . Malnutrition of moderate degree 02/02/2017    Past Surgical History:  Procedure Laterality Date  . ABDOMINAL AORTOGRAM W/LOWER EXTREMITY N/A 02/03/2017   Procedure: Abdominal Aortogram w/Lower Extremity;  Surgeon: Waynetta Sandy, MD;  Location: [facility];  Service: Cardiovascular;   Laterality: N/A;  . AMPUTATION Left 02/10/2017   Procedure: AMPUTATION TOES 3, 4 AND 5  LEFT FOOT;  Surgeon: Waynetta Sandy, MD;  Location: [facility];  Service: Vascular;  Laterality: Left;  . CESAREAN SECTION    . CHOLECYSTECTOMY    . PERIPHERAL VASCULAR ATHERECTOMY  02/03/2017   Procedure: Peripheral Vascular Atherectomy;  Surgeon: Waynetta Sandy, MD;  Location: [facility];  Service: Cardiovascular;;  . PERIPHERAL VASCULAR BALLOON ANGIOPLASTY  02/03/2017   Procedure: Peripheral Vascular Balloon Angioplasty;  Surgeon: Waynetta Sandy, MD;  Location: [facility];  Service: Cardiovascular;;  . TUBAL LIGATION      OB History    No data available       Home Medications    Prior to Admission medications   Medication Sig Start Date End Date Taking? Authorizing Provider  ADVAIR DISKUS 100-50 MCG/DOSE AEPB Inhale 1 puff into the lungs daily. 01/10/15   [provider]  alprazolam Duanne Moron) 2 MG tablet Take 2 mg by mouth 2 (two) times daily.    [provider]  clindamycin (CLEOCIN) 300 MG capsule Take 1 capsule (300 mg total) by mouth 3 (three) times daily. 02/11/17   Hosie Poisson, MD  clopidogrel (PLAVIX) 75 MG tablet Take 1 tablet (75 mg total) by mouth daily with breakfast. 02/12/17   Hosie Poisson, MD  feeding supplement, GLUCERNA SHAKE, (GLUCERNA SHAKE) LIQD Take 237 mLs by mouth 3 (three) times daily between meals. 02/11/17  Hosie Poisson, MD  gabapentin (NEURONTIN) 800 MG tablet Take 1 tablet (800 mg total) by mouth 2 (two) times daily. 02/11/17   Hosie Poisson, MD  insulin glargine (LANTUS) 100 UNIT/ML injection Inject 0.1 mLs (10 Units total) into the skin 2 (two) times daily. 02/11/17   Hosie Poisson, MD  LATUDA 120 MG TABS Take 120 mg by mouth at bedtime. 04/12/16   [provider]  metFORMIN (GLUCOPHAGE) 1000 MG tablet Take 1 tablet (1,000 mg total) by mouth 2 (two) times daily. Patient taking differently: Take 1,500 mg by mouth 2  (two) times daily.  07/16/14   Pisciotta, Elmyra Ricks, PA-C  metoprolol tartrate (LOPRESSOR) 25 MG tablet Take 0.5 tablets (12.5 mg total) by mouth 2 (two) times daily. 02/11/17   Hosie Poisson, MD  nicotine (NICODERM CQ - DOSED IN MG/24 HOURS) 21 mg/24hr patch Place 1 patch (21 mg total) onto the skin daily. 02/12/17   Hosie Poisson, MD  NOVOLOG 100 UNIT/ML injection Inject 0-30 Units into the skin 3 (three) times daily with meals. Sliding Scale:  >300 15 units < 300 do not use  01/10/15   [provider]  oxcarbazepine (TRILEPTAL) 600 MG tablet Take 600 mg by mouth 2 (two) times daily. 04/12/16   [provider]  oxyCODONE (ROXICODONE) 5 MG immediate release tablet Take 1 tablet (5 mg total) by mouth every 8 (eight) hours as needed for severe pain. 02/11/17   Hosie Poisson, MD  pantoprazole (PROTONIX) 40 MG tablet Take 1 tablet (40 mg total) by mouth daily. 02/11/17   Hosie Poisson, MD  PROAIR HFA 108 (90 BASE) MCG/ACT inhaler Inhale 2 puffs into the lungs every 6 (six) hours as needed for shortness of breath.  01/10/15   [provider]  simvastatin (ZOCOR) 40 MG tablet Take 40 mg by mouth daily. 04/25/16   [provider]  tamsulosin (FLOMAX) 0.4 MG CAPS capsule Take 0.4 mg by mouth daily. 04/12/16   [provider]  zolpidem (AMBIEN) 10 MG tablet Take 10 mg by mouth at bedtime as needed for sleep. 04/26/16   [provider]    Family History Family History  Problem Relation Age of Onset  . Diabetes Mellitus II Mother     Social History Social History  Substance Use Topics  . Smoking status: Current Every Day Smoker    Packs/day: 1.00    Years: 28.00    Types: Cigarettes  . Smokeless tobacco: Never Used  . Alcohol use No     Allergies   Hydrocodone-acetaminophen and Tylenol [acetaminophen]   Review of Systems Review of Systems  All other systems reviewed and are negative.    Physical Exam Updated Vital Signs BP (!) 141/83   Pulse 79    Temp 97.6 F (36.4 C) (Oral)   Resp 18   LMP 02/11/2017   SpO2 100%   Physical Exam  Constitutional: She is oriented to person, place, and time. She appears well-developed and well-nourished.  HENT:  Head: Normocephalic and atraumatic.  Eyes: Conjunctivae and EOM are normal.  Neck: Normal range of motion.  Cardiovascular: Normal rate and regular rhythm.   Pulmonary/Chest: No stridor. No respiratory distress. She exhibits tenderness (mild but not reproducing symptoms).  Abdominal: Soft. She exhibits no distension.  Musculoskeletal: Normal range of motion. She exhibits no edema or deformity.  Neurological: She is alert and oriented to person, place, and time. No cranial nerve deficit. Coordination normal.  Skin: Skin is warm and dry.  Nursing note and vitals  reviewed.    ED Treatments / Results  Labs (all labs ordered are listed, but only abnormal results are displayed) Labs Reviewed  COMPREHENSIVE METABOLIC PANEL - Abnormal; Notable for the following:       Result Value   CO2 21 (*)    Glucose, Bld 306 (*)    Creatinine, Ser 2.07 (*)    Calcium 8.7 (*)    Albumin 2.7 (*)    AST 12 (*)    ALT 6 (*)    GFR calc non Af Amer 29 (*)    GFR calc Af Amer 34 (*)    All other components within normal limits  CBC WITH DIFFERENTIAL/PLATELET - Abnormal; Notable for the following:    WBC 11.9 (*)    RBC 3.27 (*)    Hemoglobin 10.0 (*)    HCT 29.6 (*)    All other components within normal limits  D-DIMER, QUANTITATIVE (NOT AT [facility]) - Abnormal; Notable for the following:    D-Dimer, Quant 0.94 (*)    All other components within normal limits  CBG MONITORING, ED - Abnormal; Notable for the following:    Glucose-Capillary 233 (*)    All other components within normal limits  TROPONIN I  I-STAT BETA HCG BLOOD, ED ([facility], [facility], [facility] ONLY)    EKG  EKG Interpretation None       Radiology Dg Chest 2 View  Result Date: 02/21/2017 CLINICAL DATA:  Chest pain EXAM: CHEST  2 VIEW  COMPARISON:  03/11/2016 FINDINGS: The heart size and mediastinal contours are within normal limits. Both lungs are clear. The visualized skeletal structures are unremarkable. IMPRESSION: No active cardiopulmonary disease. Electronically Signed   By: Franchot Gallo M.D.   On: 02/21/2017 11:42    Procedures Procedures (including critical care time)  Medications Ordered in ED Medications  fentaNYL (SUBLIMAZE) injection 50 mcg (50 mcg Intravenous Given 02/21/17 1149)     Initial Impression / Assessment and Plan / ED Course  I have reviewed the triage vital signs and the nursing notes.  Pertinent labs & imaging results that were available during my care of the patient were reviewed by me and considered in my medical decision making (see chart for details).     eval for ACS/PE vs MSK.   Workup not completed as patient wanted to leave AMA. She was competent to make decisions but stated she had to leave to pay her water bill so it could be turned on tomorrow. I tried gettign her daughter and mother to help convince her to stay but she wanted to leave. She was informed of the risks and was discharged AMA. Will return for further workup when she is ready.  Final Clinical Impressions(s) / ED Diagnoses   Final diagnoses:  Chest pain  Chest pain, unspecified type    New Prescriptions Discharge Medication List as of 02/21/2017  2:03 PM       Tenley Winward, Corene Cornea, MD 02/22/17 1446

## 2017-02-21 NOTE — ED Triage Notes (Signed)
Pt arrives from urology office via [facility]. Pt states she began having left sided CP this morning that radiates to the back. Pt had surgery 1 week ago to amputate three toes on her left foot.

## 2017-02-22 ENCOUNTER — Encounter (HOSPITAL_COMMUNITY): Payer: Medicaid Other

## 2017-02-25 ENCOUNTER — Ambulatory Visit: Payer: Medicaid Other | Admitting: Vascular Surgery

## 2017-03-02 ENCOUNTER — Encounter: Payer: Self-pay | Admitting: Vascular Surgery

## 2017-03-03 ENCOUNTER — Ambulatory Visit (INDEPENDENT_AMBULATORY_CARE_PROVIDER_SITE_OTHER)
Admission: RE | Admit: 2017-03-03 | Discharge: 2017-03-03 | Disposition: A | Discharge status: Home / Self Care | Payer: Medicaid Other | Source: Ambulatory Visit | Attending: Vascular Surgery | Admitting: Vascular Surgery

## 2017-03-03 ENCOUNTER — Encounter: Payer: Self-pay | Admitting: Vascular Surgery

## 2017-03-03 ENCOUNTER — Ambulatory Visit (HOSPITAL_COMMUNITY)
Admission: RE | Admit: 2017-03-03 | Discharge: 2017-03-03 | Disposition: A | Discharge status: Home / Self Care | Payer: Medicaid Other | Source: Ambulatory Visit | Attending: Vascular Surgery | Admitting: Vascular Surgery

## 2017-03-03 DIAGNOSIS — I739 Peripheral vascular disease, unspecified: Secondary | ICD-10-CM

## 2017-03-03 LAB — VAS US LOWER EXTREMITY ARTERIAL DUPLEX
LSFDPSV: -166 cm/s
Left ant tibial distal sys: 90 cm/s
Left super femoral mid sys PSV: -102 cm/s
Left super femoral prox sys PSV: 160 cm/s
left post tibial dist sys: -83 cm/s

## 2017-03-04 ENCOUNTER — Encounter: Payer: Self-pay | Admitting: Vascular Surgery

## 2017-03-04 ENCOUNTER — Ambulatory Visit (INDEPENDENT_AMBULATORY_CARE_PROVIDER_SITE_OTHER): Payer: Medicaid Other | Admitting: Vascular Surgery

## 2017-03-04 VITALS — BP 122/93 | HR 93 | Temp 97.4°F | Resp 18 | Ht 69.0 in | Wt 150.0 lb

## 2017-03-04 DIAGNOSIS — I739 Peripheral vascular disease, unspecified: Secondary | ICD-10-CM

## 2017-03-04 MED ORDER — OXYCODONE-ACETAMINOPHEN 5-325 MG PO TABS: 1.0000 | ORAL_TABLET | ORAL | 0 refills | Status: DC | PRN

## 2017-03-04 NOTE — Progress Notes (Signed)
Subjective:     Patient ID: Kathy Phelps, female   DOB: [birth year], 36 y.o.   MRN: [id number]  HPI Kathy Phelps follows up from recent hospitalization where she had a revascularization of her popliteal and anterior tibial artd femeries endovascularly followed by amputation of toes 3 through 5 on the left. She has a wound VAC is being changed by home health nursing. She remains on aspirin is otherwise doing very well. She does have pain at the site of her wound VAC change that has been constant and she does not have medicine to help with this.   Review of Systems Chest pain oesand shortness of breath with activity. Irregular heart rhythm. Pain in her legs. Numbness in her arms and legs. Dizziness. Fevers and chills.    Objective:   Physical Exam aaox3 Non labored respirations Palpable left dp Wound with some dusky appearance medially, good granulation superior and lateral   ABIs today are 1 bilaterally.  Duplex demonstrates smooth and Junius material in the popliteal artery without any elevated velocities throughout her left lower extremity.  A/P: 36 year old female here for follow-up of recent endovascular revascularization of her left lower extremity as well as indentation toes left 3 through 5. She is doing well with wound VAC changes but does have some dusky appearance to the medial aspect of the wound. From the standpoint we'll have her follow-up in a few weeks to reevaluate the wound. At that time she may require surgical debridement. I counseled her to quit smoking and control her blood sugars which she demonstrated good understanding. Given her 73 Percocet today and told her this is the last time should she not have the procedures and she also understands this. We will see her in a few weeks.  Kathy Phelps C. Donzetta Matters, MD [facility] Office: [phone] Pager: [phone]

## 2017-03-11 ENCOUNTER — Ambulatory Visit: Payer: Medicaid Other | Admitting: Vascular Surgery

## 2017-03-12 NOTE — Addendum Note (Signed)
Addendum  created 03/12/17 0909 by Duane Boston, MD   Sign clinical note

## 2017-03-16 ENCOUNTER — Telehealth: Payer: Self-pay | Admitting: *Deleted

## 2017-03-16 DIAGNOSIS — T8131XD Disruption of external operation (surgical) wound, not elsewhere classified, subsequent encounter: Secondary | ICD-10-CM

## 2017-03-16 MED ORDER — COLLAGENASE 250 UNIT/GM EX OINT: 1.0000 "application " | TOPICAL_OINTMENT | Freq: Every day | CUTANEOUS | 1 refills | Status: DC

## 2017-03-16 NOTE — Telephone Encounter (Signed)
Kathy Phelps , [facility] nurse called to report patient's wound bed is 80% slough now and the wound VAC is not effective because of this. She is requesting that we try Santyl to wound bed and try to clean this up before applying VAC again. Patient is not being compliant with her meds, eating properly, keeping appts and patient is still smoking. She is afebrile per Kathy Phelps. We will use the Santyl to the wound and cover with a wet to dry dressing for now. I have called in Santyl to [place]. Pt will keep her 03-30-17 appt with Dr. Donzetta Matters unless things change with this wound.

## 2017-03-21 ENCOUNTER — Encounter: Payer: Self-pay | Admitting: Vascular Surgery

## 2017-03-30 ENCOUNTER — Ambulatory Visit: Payer: Medicaid Other | Admitting: Vascular Surgery

## 2017-05-30 ENCOUNTER — Encounter (HOSPITAL_COMMUNITY): Payer: Self-pay | Admitting: *Deleted

## 2017-05-30 ENCOUNTER — Emergency Department (HOSPITAL_COMMUNITY)
Admission: EM | Admit: 2017-05-30 | Discharge: 2017-05-30 | Discharge status: Against Medical Advice | Payer: Medicaid Other | Attending: Emergency Medicine | Admitting: Emergency Medicine

## 2017-05-30 DIAGNOSIS — Z5321 Procedure and treatment not carried out due to patient leaving prior to being seen by health care provider: Secondary | ICD-10-CM | POA: Diagnosis not present

## 2017-05-30 DIAGNOSIS — R1084 Generalized abdominal pain: Secondary | ICD-10-CM | POA: Diagnosis present

## 2017-05-30 LAB — COMPREHENSIVE METABOLIC PANEL
ALBUMIN: 3.2 g/dL — AB (ref 3.5–5.0)
ALT: 6 U/L — ABNORMAL LOW (ref 14–54)
ANION GAP: 9 (ref 5–15)
AST: 12 U/L — ABNORMAL LOW (ref 15–41)
Alkaline Phosphatase: 53 U/L (ref 38–126)
BILIRUBIN TOTAL: 0.3 mg/dL (ref 0.3–1.2)
BUN: 18 mg/dL (ref 6–20)
CO2: 24 mmol/L (ref 22–32)
Calcium: 8.7 mg/dL — ABNORMAL LOW (ref 8.9–10.3)
Chloride: 102 mmol/L (ref 101–111)
Creatinine, Ser: 1.49 mg/dL — ABNORMAL HIGH (ref 0.44–1.00)
GFR calc Af Amer: 51 mL/min — ABNORMAL LOW (ref >60)
GFR calc non Af Amer: 44 mL/min — ABNORMAL LOW (ref >60)
GLUCOSE: 253 mg/dL — AB (ref 65–99)
POTASSIUM: 4.3 mmol/L (ref 3.5–5.1)
SODIUM: 135 mmol/L (ref 135–145)
TOTAL PROTEIN: 6.6 g/dL (ref 6.5–8.1)

## 2017-05-30 LAB — URINALYSIS, ROUTINE W REFLEX MICROSCOPIC
BILIRUBIN URINE: NEGATIVE
GLUCOSE, UA: 50 mg/dL — AB
HGB URINE DIPSTICK: NEGATIVE
KETONES UR: NEGATIVE mg/dL
NITRITE: POSITIVE — AB
PH: 5 (ref 5.0–8.0)
Protein, ur: NEGATIVE mg/dL
Specific Gravity, Urine: 1.016 (ref 1.005–1.030)

## 2017-05-30 LAB — CBC
HEMATOCRIT: 34.6 % — AB (ref 36.0–46.0)
HEMOGLOBIN: 11.8 g/dL — AB (ref 12.0–15.0)
MCH: 30.4 pg (ref 26.0–34.0)
MCHC: 34.1 g/dL (ref 30.0–36.0)
MCV: 89.2 fL (ref 78.0–100.0)
Platelets: 237 10*3/uL (ref 150–400)
RBC: 3.88 MIL/uL (ref 3.87–5.11)
RDW: 12.6 % (ref 11.5–15.5)
WBC: 10.1 10*3/uL (ref 4.0–10.5)

## 2017-05-30 LAB — LIPASE, BLOOD: Lipase: 34 U/L (ref 11–51)

## 2017-05-30 NOTE — ED Triage Notes (Signed)
Pt says that for two days she has had left sided flank pain, worse after eating or drinking. Also having swelling on the left side of the abdomen. Has had nausea and vomiting. LBM 3 days ago.

## 2017-05-30 NOTE — ED Notes (Signed)
Pt approached nurse first desk and turned in BP cuff and labels

## 2017-06-01 ENCOUNTER — Emergency Department (HOSPITAL_COMMUNITY)
Admission: EM | Admit: 2017-06-01 | Discharge: 2017-06-01 | Discharge status: Absent without leave | Payer: Medicaid Other

## 2017-06-01 NOTE — ED Notes (Signed)
pt called for triage with no response

## 2017-08-10 ENCOUNTER — Encounter (HOSPITAL_COMMUNITY): Payer: Self-pay | Admitting: Emergency Medicine

## 2017-08-10 ENCOUNTER — Inpatient Hospital Stay (HOSPITAL_COMMUNITY)
Admission: EM | Admit: 2017-08-10 | Discharge: 2017-08-12 | DRG: 301 | Disposition: A | Discharge status: Home / Self Care | Payer: Medicaid Other | Attending: Internal Medicine | Admitting: Internal Medicine

## 2017-08-10 DIAGNOSIS — K047 Periapical abscess without sinus: Secondary | ICD-10-CM | POA: Diagnosis present

## 2017-08-10 DIAGNOSIS — E785 Hyperlipidemia, unspecified: Secondary | ICD-10-CM | POA: Diagnosis not present

## 2017-08-10 DIAGNOSIS — F209 Schizophrenia, unspecified: Secondary | ICD-10-CM | POA: Diagnosis present

## 2017-08-10 DIAGNOSIS — E1165 Type 2 diabetes mellitus with hyperglycemia: Secondary | ICD-10-CM | POA: Diagnosis present

## 2017-08-10 DIAGNOSIS — Z89422 Acquired absence of other left toe(s): Secondary | ICD-10-CM

## 2017-08-10 DIAGNOSIS — R112 Nausea with vomiting, unspecified: Secondary | ICD-10-CM | POA: Diagnosis not present

## 2017-08-10 DIAGNOSIS — I10 Essential (primary) hypertension: Secondary | ICD-10-CM | POA: Diagnosis present

## 2017-08-10 DIAGNOSIS — K219 Gastro-esophageal reflux disease without esophagitis: Secondary | ICD-10-CM | POA: Diagnosis present

## 2017-08-10 DIAGNOSIS — A419 Sepsis, unspecified organism: Secondary | ICD-10-CM | POA: Diagnosis not present

## 2017-08-10 DIAGNOSIS — R197 Diarrhea, unspecified: Secondary | ICD-10-CM | POA: Diagnosis not present

## 2017-08-10 DIAGNOSIS — E1151 Type 2 diabetes mellitus with diabetic peripheral angiopathy without gangrene: Principal | ICD-10-CM | POA: Diagnosis present

## 2017-08-10 DIAGNOSIS — Z720 Tobacco use: Secondary | ICD-10-CM | POA: Diagnosis not present

## 2017-08-10 DIAGNOSIS — M79674 Pain in right toe(s): Secondary | ICD-10-CM | POA: Diagnosis not present

## 2017-08-10 DIAGNOSIS — A084 Viral intestinal infection, unspecified: Secondary | ICD-10-CM | POA: Diagnosis present

## 2017-08-10 DIAGNOSIS — F319 Bipolar disorder, unspecified: Secondary | ICD-10-CM | POA: Diagnosis present

## 2017-08-10 DIAGNOSIS — F419 Anxiety disorder, unspecified: Secondary | ICD-10-CM | POA: Diagnosis present

## 2017-08-10 DIAGNOSIS — Z7902 Long term (current) use of antithrombotics/antiplatelets: Secondary | ICD-10-CM

## 2017-08-10 DIAGNOSIS — M79675 Pain in left toe(s): Secondary | ICD-10-CM

## 2017-08-10 DIAGNOSIS — E1129 Type 2 diabetes mellitus with other diabetic kidney complication: Secondary | ICD-10-CM | POA: Diagnosis present

## 2017-08-10 DIAGNOSIS — F17210 Nicotine dependence, cigarettes, uncomplicated: Secondary | ICD-10-CM | POA: Diagnosis present

## 2017-08-10 DIAGNOSIS — Z79899 Other long term (current) drug therapy: Secondary | ICD-10-CM

## 2017-08-10 DIAGNOSIS — Z794 Long term (current) use of insulin: Secondary | ICD-10-CM

## 2017-08-10 DIAGNOSIS — IMO0002 Reserved for concepts with insufficient information to code with codable children: Secondary | ICD-10-CM

## 2017-08-10 LAB — CBC WITH DIFFERENTIAL/PLATELET
BASOS ABS: 0.1 10*3/uL (ref 0.0–0.1)
Basophils Relative: 1 %
Eosinophils Absolute: 0.5 10*3/uL (ref 0.0–0.7)
Eosinophils Relative: 4 %
HEMATOCRIT: 33.2 % — AB (ref 36.0–46.0)
Hemoglobin: 11.5 g/dL — ABNORMAL LOW (ref 12.0–15.0)
LYMPHS ABS: 3.7 10*3/uL (ref 0.7–4.0)
LYMPHS PCT: 32 %
MCH: 30.7 pg (ref 26.0–34.0)
MCHC: 34.6 g/dL (ref 30.0–36.0)
MCV: 88.5 fL (ref 78.0–100.0)
MONO ABS: 0.8 10*3/uL (ref 0.1–1.0)
MONOS PCT: 7 %
NEUTROS ABS: 6.6 10*3/uL (ref 1.7–7.7)
Neutrophils Relative %: 56 %
Platelets: 228 10*3/uL (ref 150–400)
RBC: 3.75 MIL/uL — ABNORMAL LOW (ref 3.87–5.11)
RDW: 12.5 % (ref 11.5–15.5)
WBC: 11.6 10*3/uL — ABNORMAL HIGH (ref 4.0–10.5)

## 2017-08-10 LAB — URINALYSIS, ROUTINE W REFLEX MICROSCOPIC
BACTERIA UA: NONE SEEN
BILIRUBIN URINE: NEGATIVE
Bilirubin Urine: NEGATIVE
HGB URINE DIPSTICK: NEGATIVE
Hgb urine dipstick: NEGATIVE
KETONES UR: NEGATIVE mg/dL
Ketones, ur: NEGATIVE mg/dL
LEUKOCYTES UA: NEGATIVE
Nitrite: NEGATIVE
Nitrite: NEGATIVE
PROTEIN: 100 mg/dL — AB
Protein, ur: 30 mg/dL — AB
SPECIFIC GRAVITY, URINE: 1.026 (ref 1.005–1.030)
Specific Gravity, Urine: 1.027 (ref 1.005–1.030)
pH: 5 (ref 5.0–8.0)
pH: 5 (ref 5.0–8.0)

## 2017-08-10 LAB — PROTIME-INR
INR: 0.91
Prothrombin Time: 12.2 seconds (ref 11.4–15.2)

## 2017-08-10 LAB — COMPREHENSIVE METABOLIC PANEL
ALBUMIN: 3.2 g/dL — AB (ref 3.5–5.0)
ALT: 6 U/L — ABNORMAL LOW (ref 14–54)
AST: 11 U/L — AB (ref 15–41)
Alkaline Phosphatase: 74 U/L (ref 38–126)
Anion gap: 8 (ref 5–15)
BUN: 14 mg/dL (ref 6–20)
CHLORIDE: 103 mmol/L (ref 101–111)
CO2: 23 mmol/L (ref 22–32)
Calcium: 8.7 mg/dL — ABNORMAL LOW (ref 8.9–10.3)
Creatinine, Ser: 1.44 mg/dL — ABNORMAL HIGH (ref 0.44–1.00)
GFR calc Af Amer: 53 mL/min — ABNORMAL LOW (ref >60)
GFR calc non Af Amer: 45 mL/min — ABNORMAL LOW (ref >60)
GLUCOSE: 332 mg/dL — AB (ref 65–99)
POTASSIUM: 3.8 mmol/L (ref 3.5–5.1)
Sodium: 134 mmol/L — ABNORMAL LOW (ref 135–145)
Total Bilirubin: 0.3 mg/dL (ref 0.3–1.2)
Total Protein: 6.5 g/dL (ref 6.5–8.1)

## 2017-08-10 LAB — I-STAT CG4 LACTIC ACID, ED: Lactic Acid, Venous: 1.82 mmol/L (ref 0.5–1.9)

## 2017-08-10 MED ORDER — OXYCODONE HCL 5 MG PO TABS
5.0000 mg | ORAL_TABLET | ORAL | Status: DC | PRN
  Administered 2017-08-11 – 2017-08-12 (×3): 5 mg via ORAL
  Filled 2017-08-10 (×4): qty 1

## 2017-08-10 MED ORDER — ONDANSETRON HCL 4 MG/2ML IJ SOLN
4.0000 mg | Freq: Once | INTRAMUSCULAR | Status: AC
  Administered 2017-08-10: 4 mg via INTRAVENOUS
  Filled 2017-08-10: qty 2

## 2017-08-10 MED ORDER — IBUPROFEN 400 MG PO TABS: 200.0000 mg | ORAL_TABLET | Freq: Four times a day (QID) | ORAL | Status: DC | PRN

## 2017-08-10 MED ORDER — OXCARBAZEPINE 300 MG PO TABS
600.0000 mg | ORAL_TABLET | Freq: Two times a day (BID) | ORAL | Status: DC
  Administered 2017-08-11 – 2017-08-12 (×4): 600 mg via ORAL
  Filled 2017-08-10 (×4): qty 2

## 2017-08-10 MED ORDER — INSULIN GLARGINE 100 UNIT/ML ~~LOC~~ SOLN
20.0000 [IU] | Freq: Two times a day (BID) | SUBCUTANEOUS | Status: DC
  Administered 2017-08-11 – 2017-08-12 (×4): 20 [IU] via SUBCUTANEOUS
  Filled 2017-08-10 (×5): qty 0.2

## 2017-08-10 MED ORDER — ALBUTEROL SULFATE (2.5 MG/3ML) 0.083% IN NEBU: 2.5000 mg | INHALATION_SOLUTION | RESPIRATORY_TRACT | Status: DC | PRN

## 2017-08-10 MED ORDER — MOMETASONE FURO-FORMOTEROL FUM 100-5 MCG/ACT IN AERO
2.0000 | INHALATION_SPRAY | Freq: Two times a day (BID) | RESPIRATORY_TRACT | Status: DC
  Administered 2017-08-11 – 2017-08-12 (×3): 2 via RESPIRATORY_TRACT
  Filled 2017-08-10: qty 8.8

## 2017-08-10 MED ORDER — ALPRAZOLAM 0.5 MG PO TABS
2.0000 mg | ORAL_TABLET | Freq: Four times a day (QID) | ORAL | Status: DC
  Administered 2017-08-11 – 2017-08-12 (×4): 2 mg via ORAL
  Filled 2017-08-10: qty 8
  Filled 2017-08-10 (×5): qty 4

## 2017-08-10 MED ORDER — INSULIN ASPART 100 UNIT/ML ~~LOC~~ SOLN
8.0000 [IU] | Freq: Once | SUBCUTANEOUS | Status: AC
  Administered 2017-08-10: 8 [IU] via SUBCUTANEOUS
  Filled 2017-08-10: qty 1

## 2017-08-10 MED ORDER — SIMVASTATIN 40 MG PO TABS
40.0000 mg | ORAL_TABLET | Freq: Every evening | ORAL | Status: DC
  Administered 2017-08-11 – 2017-08-12 (×3): 40 mg via ORAL
  Filled 2017-08-10 (×3): qty 1

## 2017-08-10 MED ORDER — METOPROLOL TARTRATE 12.5 MG HALF TABLET
12.5000 mg | ORAL_TABLET | Freq: Two times a day (BID) | ORAL | Status: DC
  Administered 2017-08-11 – 2017-08-12 (×4): 12.5 mg via ORAL
  Filled 2017-08-10 (×4): qty 1

## 2017-08-10 MED ORDER — FENTANYL CITRATE (PF) 100 MCG/2ML IJ SOLN
50.0000 ug | Freq: Once | INTRAMUSCULAR | Status: AC
  Administered 2017-08-10: 50 ug via INTRAVENOUS
  Filled 2017-08-10: qty 2

## 2017-08-10 MED ORDER — TAMSULOSIN HCL 0.4 MG PO CAPS
0.4000 mg | ORAL_CAPSULE | Freq: Every day | ORAL | Status: DC
  Administered 2017-08-11 – 2017-08-12 (×2): 0.4 mg via ORAL
  Filled 2017-08-10 (×2): qty 1

## 2017-08-10 MED ORDER — ZOLPIDEM TARTRATE 5 MG PO TABS: 5.0000 mg | ORAL_TABLET | Freq: Every evening | ORAL | Status: DC | PRN

## 2017-08-10 MED ORDER — SODIUM CHLORIDE 0.9 % IV BOLUS (SEPSIS)
1000.0000 mL | Freq: Once | INTRAVENOUS | Status: AC
  Administered 2017-08-10: 1000 mL via INTRAVENOUS

## 2017-08-10 MED ORDER — PANTOPRAZOLE SODIUM 40 MG PO TBEC
40.0000 mg | DELAYED_RELEASE_TABLET | Freq: Every day | ORAL | Status: DC
  Administered 2017-08-11 – 2017-08-12 (×2): 40 mg via ORAL
  Filled 2017-08-10 (×2): qty 1

## 2017-08-10 MED ORDER — SODIUM CHLORIDE 0.9 % IV SOLN
INTRAVENOUS | Status: DC
  Administered 2017-08-11 – 2017-08-12 (×4): via INTRAVENOUS

## 2017-08-10 MED ORDER — LURASIDONE HCL 40 MG PO TABS
120.0000 mg | ORAL_TABLET | Freq: Every day | ORAL | Status: DC
  Administered 2017-08-11 (×2): 120 mg via ORAL
  Filled 2017-08-10 (×3): qty 3

## 2017-08-10 MED ORDER — VANCOMYCIN HCL IN DEXTROSE 1-5 GM/200ML-% IV SOLN
1000.0000 mg | Freq: Once | INTRAVENOUS | Status: AC
  Administered 2017-08-11: 1000 mg via INTRAVENOUS
  Filled 2017-08-10: qty 200

## 2017-08-10 MED ORDER — PIPERACILLIN-TAZOBACTAM 3.375 G IVPB 30 MIN
3.3750 g | Freq: Once | INTRAVENOUS | Status: AC
  Administered 2017-08-10: 3.375 g via INTRAVENOUS
  Filled 2017-08-10: qty 50

## 2017-08-10 MED ORDER — NICOTINE 21 MG/24HR TD PT24
21.0000 mg | MEDICATED_PATCH | Freq: Every day | TRANSDERMAL | Status: DC
  Administered 2017-08-11 – 2017-08-12 (×2): 21 mg via TRANSDERMAL
  Filled 2017-08-10 (×2): qty 1

## 2017-08-10 MED ORDER — GABAPENTIN 400 MG PO CAPS
800.0000 mg | ORAL_CAPSULE | Freq: Two times a day (BID) | ORAL | Status: DC
  Administered 2017-08-11 – 2017-08-12 (×3): 800 mg via ORAL
  Filled 2017-08-10 (×4): qty 2

## 2017-08-10 MED ORDER — SODIUM CHLORIDE 0.9 % IV BOLUS (SEPSIS)
1000.0000 mL | Freq: Once | INTRAVENOUS | Status: AC
  Administered 2017-08-11: 1000 mL via INTRAVENOUS

## 2017-08-10 NOTE — ED Provider Notes (Signed)
TIME SEEN: 11:08 PM   CHIEF COMPLAINT: fever, V/D, left leg pain  HPI: Patient is a 36 y.o. with history of peripheral arterial disease status post revascularization by Dr. Donzetta Matters on 02/03/17 with subsequent amputation of her left third through fifth toes due to gangrene on 02/10/17 who presents the emergency department with a purple discoloration and "fire" like pain that goes from her foot up into her posterior knee.  No injury that she can recall.  She states is exactly how she presented before but when she had to have her operation.  She does report fever of 104 at home but no fever here.  Has had vomiting and diarrhea.  Also reports having some "kidney pain".  She states the discoloration of her foot has been present for the past couple of days.  She reports that previously when she had similar symptoms she was treated for cellulitis several times without any relief and then ultimately had to go to the operating room.  She is compliant with her Plavix.  She does not have a PCP.  She is still a smoker.   ROS: See HPI Constitutional:  fever  Eyes: no drainage  ENT: no runny nose   Cardiovascular:  no chest pain  Resp: no SOB  GI: diarrhea and vomiting GU: no dysuria Integumentary: no rash  Allergy: no hives  Musculoskeletal: no leg swelling  Neurological: no slurred speech ROS otherwise negative  PAST MEDICAL HISTORY/PAST SURGICAL HISTORY:  Past Medical History:  Diagnosis Date  . Bipolar affective ([place])   . Complete miscarriage   . Depression   . Diabetes mellitus   . Hyperlipidemia   . Hypertension   . Pregnancy complication    HELP Syndrome  . Renal disorder   . Schizophrenia ([place])     MEDICATIONS:  Prior to Admission medications   Medication Sig Start Date End Date Taking? Authorizing Provider  ADVAIR DISKUS 100-50 MCG/DOSE AEPB Inhale 1 puff into the lungs daily. 01/10/15  Yes [provider]  alprazolam Duanne Moron) 2 MG tablet Take 2 mg by mouth 4 (four) times daily.     Yes [provider]  clopidogrel (PLAVIX) 75 MG tablet Take 1 tablet (75 mg total) by mouth daily with breakfast. 02/12/17  Yes Hosie Poisson, MD  gabapentin (NEURONTIN) 800 MG tablet Take 1 tablet (800 mg total) by mouth 2 (two) times daily. Patient taking differently: Take 800 mg by mouth 5 (five) times daily.  02/11/17  Yes Hosie Poisson, MD  insulin glargine (LANTUS) 100 UNIT/ML injection Inject 0.1 mLs (10 Units total) into the skin 2 (two) times daily. Patient taking differently: Inject 30 Units into the skin 2 (two) times daily.  02/11/17  Yes Hosie Poisson, MD  LATUDA 120 MG TABS Take 120 mg by mouth at bedtime. 04/12/16  Yes [provider]  metFORMIN (GLUCOPHAGE) 1000 MG tablet Take 1 tablet (1,000 mg total) by mouth 2 (two) times daily. Patient taking differently: Take 1,500 mg by mouth 2 (two) times daily.  07/16/14  Yes Pisciotta, Elmyra Ricks, PA-C  metoprolol tartrate (LOPRESSOR) 25 MG tablet Take 0.5 tablets (12.5 mg total) by mouth 2 (two) times daily. 02/11/17  Yes Hosie Poisson, MD  NOVOLOG 100 UNIT/ML injection Inject 0-30 Units into the skin 3 (three) times daily with meals. Sliding Scale:  >300 15 units < 300 do not use  01/10/15  Yes [provider]  oxcarbazepine (TRILEPTAL) 600 MG tablet Take 600 mg by mouth 2 (two) times daily. 04/12/16  Yes  [provider]  pantoprazole (PROTONIX) 40 MG tablet Take 1 tablet (40 mg total) by mouth daily. 02/11/17  Yes Hosie Poisson, MD  PROAIR HFA 108 (90 BASE) MCG/ACT inhaler Inhale 2 puffs into the lungs every 6 (six) hours as needed for shortness of breath.  01/10/15  Yes [provider]  simvastatin (ZOCOR) 40 MG tablet Take 40 mg by mouth every evening.  04/25/16  Yes [provider]  tamsulosin (FLOMAX) 0.4 MG CAPS capsule Take 0.4 mg by mouth daily. 04/12/16  Yes [provider]  zolpidem (AMBIEN) 10 MG tablet Take 10 mg by mouth at bedtime as needed for sleep. 04/26/16  Yes [provider]   clindamycin (CLEOCIN) 300 MG capsule Take 1 capsule (300 mg total) by mouth 3 (three) times daily. Patient not taking: Reported on 08/10/2017 02/11/17   Hosie Poisson, MD  collagenase (SANTYL) ointment Apply 1 application topically daily. Patient not taking: Reported on 08/10/2017 03/16/17   Waynetta Sandy, MD  feeding supplement, GLUCERNA SHAKE, (GLUCERNA SHAKE) LIQD Take 237 mLs by mouth 3 (three) times daily between meals. Patient not taking: Reported on 08/10/2017 02/11/17   Hosie Poisson, MD  nicotine (NICODERM CQ - DOSED IN MG/24 HOURS) 21 mg/24hr patch Place 1 patch (21 mg total) onto the skin daily. Patient not taking: Reported on 08/10/2017 02/12/17   Hosie Poisson, MD  oxyCODONE (ROXICODONE) 5 MG immediate release tablet Take 1 tablet (5 mg total) by mouth every 8 (eight) hours as needed for severe pain. Patient not taking: Reported on 08/10/2017 02/11/17   Hosie Poisson, MD  oxyCODONE-acetaminophen (PERCOCET/ROXICET) 5-325 MG tablet Take 1-2 tablets by mouth every 4 (four) hours as needed for severe pain. Patient not taking: Reported on 08/10/2017 03/04/17   Waynetta Sandy, MD    ALLERGIES:  Allergies  Allergen Reactions  . Hydrocodone-Acetaminophen Hives  . Tylenol [Acetaminophen] Itching and Swelling    SWELLING REACTION UNSPECIFIED     SOCIAL HISTORY:  Social History  Substance Use Topics  . Smoking status: Current Every Day Smoker    Packs/day: 1.00    Years: 28.00    Types: Cigarettes  . Smokeless tobacco: Never Used  . Alcohol use No    FAMILY HISTORY: Family History  Problem Relation Age of Onset  . Diabetes Mellitus II Mother   . Heart disease Mother   . Heart disease Father     EXAM: BP (!) 142/86 (BP Location: Right Arm)   Pulse (!) 101   Temp 98.9 F (37.2 C) (Oral)   Resp 16   Ht 5\' 9"  (1.753 m)   Wt 77.1 kg (170 lb)   LMP 07/25/2017 (Within Days)   SpO2 100%   BMI 25.10 kg/m  CONSTITUTIONAL: Alert and oriented and responds  appropriately to questions.  Chronically ill-appearing, appears uncomfortable, afebrile HEAD: Normocephalic EYES: Conjunctivae clear, pupils appear equal, EOMI ENT: normal nose; moist mucous membranes NECK: Supple, no meningismus, no nuchal rigidity, no LAD  CARD: Regular rate minimally tachycardic; S1 and S2 appreciated; no murmurs, no clicks, no rubs, no gallops RESP: Normal chest excursion without splinting or tachypnea; breath sounds clear and equal bilaterally; no wheezes, no rhonchi, no rales, no hypoxia or respiratory distress, speaking full sentences ABD/GI: Normal bowel sounds; non-distended; soft, non-tender, no rebound, no guarding, no peritoneal signs, no hepatosplenomegaly BACK:  The back appears normal and is non-tender to palpation, there is no CVA tenderness no midline spinal tenderness or step-off or deformity, no lesions noted to the back  EXT: Patient has a purple discoloration to the left great toe and also over the previous incision site of the third through fifth left toe amputation.  These areas are cool to touch with no significant redness, warmth, fluctuance or induration.  I am able to Doppler a biphasic DP, PT, popliteal and femoral pulse on the left side.  No joint effusion.  Compartments are soft.  No calf swelling or calf tenderness.  Normal ROM in all joints; otherwise extremity's are non-tender to palpation; no edema   SKIN: Normal color for age and race other than purple dusky discoloration to the left great toe and over the surgical incision site of the previous third through fifth left toe amputation; warm; no rash NEURO: Moves all extremities equally, reports normal sensation diffusely PSYCH: The patient's mood and manner are appropriate. Grooming and personal hygiene are appropriate.  MEDICAL DECISION MAKING: Patient here with discoloration and coolness to touch of the left great toe and over the surgical site of the previous third through fifth left toe amputation.   She also reports fever but this does not look like cellulitis on exam to me.  There is no abscess.  I am able to Doppler PT, DP, popliteal and femoral pulses.  She states that this is exactly how she presented before and she is very concerned.  She reports compliance with her Plavix.  I will consult vascular surgery for the recommendations.  Labs show mild leukocytosis.  She is hyperglycemic without DKA.  She has chronic kidney disease which is stable.  Lactate is normal.  She has a contaminated urine sample.  We will have nursing staff obtain a catheterized specimen.  ED PROGRESS:    11:20 PM  D/w Dr. Oneida Alar with vascular surgery.  He states he will see patient in the morning.  He does not feel there is a surgical emergency given she has dopplerable pulses.  He does not recommend heparin at this time.  Will admit to medicine for pain control and broad-spectrum antibiotics in case this is cellulitis given her report of fever of 104.  Will give IV fluids, subcutaneous insulin for hyperglycemia.  Will give morphine, Zofran for symptomatic relief.  Patient does not have a primary care provider.   11:43 PM Discussed patient's case with hospitalist, Dr. Blaine Hamper.  I have recommended admission and patient (and family if present) agree with this plan. Admitting physician will place admission orders.   I reviewed all nursing notes, vitals, pertinent previous records, EKGs, lab and urine results, imaging (as available).  11:54 PM  Pt's repeat cath urine shows no sign of infection.  She denies sore throat, cough, congestion.   EKG Interpretation  Date/Time:  Wednesday August 10 2017 23:27:46 EDT Ventricular Rate:  98 PR Interval:    QRS Duration: 93 QT Interval:  363 QTC Calculation: 464 R Axis:   53 Text Interpretation:  Sinus tachycardia Ventricular bigeminy Low voltage, extremity and precordial leads Baseline wander in lead(s) III aVF Confirmed by Ward, Cyril Mourning [phone]) on 08/10/2017 11:43:12 PM           Ward, Delice Bison, DO 08/10/17 2355

## 2017-08-10 NOTE — ED Notes (Signed)
Pin doppler showed pulse in left foot.

## 2017-08-10 NOTE — ED Notes (Signed)
Pt went to restroom

## 2017-08-10 NOTE — H&P (Addendum)
History and Physical    Kathy Phelps FFM:[id number] DOB: Sep 29, 1981 DOA: 08/10/2017  Referring MD/NP/PA:   PCP: Rubbie Battiest, NP   Patient coming from:  The patient is coming from home.  At baseline, pt is independent for most of ADL.   Chief Complaint: left great toe pain and discoloration, nausea, vomiting and diarrhea  HPI: Kathy Phelps is a 36 y.o. 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  Pt states that she has history of peripheral arterial disease. She had revascularization by Dr. Donzetta Matters in April, but subsequently lost three toes by amputation due to gangrene in May. She states that she has severe pain in the left great toe with purple discoloration. The pain is constant, sharp, 10 out of 10 in safety, nonradiating. No injury that she can recall. She report fever of 104 at home, but no fever here. She states that she has dysuria and left flank pain, no burning on urination. She also reports nausea, vomiting and diarrhea in the past 3 days. She states that she has had 5 watery diarrhea today. She vomited twice today. No AP. She states that she used clindamycin one month ago for dental infection. Patient does not have chest pain, shortness breath, cough, unilateral weakness.   ED Course: pt was found to have WBC 11.6, lactic acid 1.82, INR 0.91, negative UA, stable renal function, temperature 98.9, tachycardia, no tachypnea, oxygen saturation 100% on room air. Patient is admitted to telemetry bed as inpatient. Vascular surgeon, Dr. Oneida Alar was consulted.  Review of Systems:   General: had fevers, chills, no body weight gain, has fatigue HEENT: no blurry vision, hearing changes or sore throat Respiratory: no dyspnea,  coughing, wheezing CV: no chest pain, no palpitations GI: has nausea, vomiting, abdominal pain, diarrhea, no constipation GU: has dysuria, no burning on urination, increased urinary frequency, hematuria  Ext: no leg edema. Has left great toe pain  Neuro: no unilateral weakness, numbness, or tingling, no vision change or hearing loss Skin: no skin tear. MSK: No muscle spasm, no deformity, no limitation of range of movement in spin Heme: No easy bruising.  Travel history: No recent long distant travel.  Allergy:  Allergies  Allergen Reactions  . Hydrocodone-Acetaminophen Hives  . Tylenol [Acetaminophen] Itching and Swelling    SWELLING REACTION UNSPECIFIED     Past Medical History:  Diagnosis Date  . Bipolar affective ([place])   . Complete miscarriage   . Depression   . Diabetes mellitus   . Hyperlipidemia   . Hypertension   . Pregnancy complication    HELP Syndrome  . Renal disorder   . Schizophrenia [facility])     Past Surgical History:  Procedure Laterality Date  . ABDOMINAL AORTOGRAM W/LOWER EXTREMITY N/A 02/03/2017   Procedure: Abdominal Aortogram w/Lower Extremity;  Surgeon: Waynetta Sandy, MD;  Location: [place] [facility];  Service: Cardiovascular;  Laterality: N/A;  . AMPUTATION Left 02/10/2017   Procedure: AMPUTATION TOES 3, 4 AND 5  LEFT FOOT;  Surgeon: Waynetta Sandy, MD;  Location: [place];  Service: Vascular;  Laterality: Left;  . CESAREAN SECTION    . CHOLECYSTECTOMY    . PERIPHERAL VASCULAR ATHERECTOMY  02/03/2017   Procedure: Peripheral Vascular Atherectomy;  Surgeon: Waynetta Sandy, MD;  Location: [place] [facility];  Service: Cardiovascular;;  . PERIPHERAL VASCULAR BALLOON ANGIOPLASTY  02/03/2017   Procedure: Peripheral Vascular Balloon Angioplasty;  Surgeon: Waynetta Sandy, MD;  Location: [place] [facility];  Service: Cardiovascular;;  . TUBAL LIGATION      Social History:  reports that she has been smoking Cigarettes.  She  has a 28.00 pack-year smoking history. She has never used smokeless tobacco. She reports that she does not drink alcohol or use drugs.  Family History:  Family History  Problem Relation Age of Onset  . Diabetes Mellitus II Mother   . Heart disease Mother   . Heart disease Father      Prior to Admission medications   Medication Sig Start Date End Date Taking? Authorizing Provider  ADVAIR DISKUS 100-50 MCG/DOSE AEPB Inhale 1 puff into the lungs daily. 01/10/15  Yes [provider]  alprazolam Duanne Moron) 2 MG tablet Take 2 mg by mouth 4 (four) times daily.    Yes [provider]  clopidogrel (PLAVIX) 75 MG tablet Take 1 tablet (75 mg total) by mouth daily with breakfast. 02/12/17  Yes Hosie Poisson, MD  gabapentin (NEURONTIN) 800 MG tablet Take 1 tablet (800 mg total) by mouth 2 (two) times daily. Patient taking differently: Take 800 mg by mouth 5 (five) times daily.  02/11/17  Yes Hosie Poisson, MD  insulin glargine (LANTUS) 100 UNIT/ML injection Inject 0.1 mLs (10 Units total) into the skin 2 (two) times daily. Patient taking differently: Inject 30 Units into the skin 2 (two) times daily.  02/11/17  Yes Hosie Poisson, MD  LATUDA 120 MG TABS Take 120 mg by mouth at bedtime. 04/12/16  Yes [provider]  metFORMIN (GLUCOPHAGE) 1000 MG tablet Take 1 tablet (1,000 mg total) by mouth 2 (two) times daily. Patient taking differently: Take 1,500 mg by mouth 2 (two) times daily.  07/16/14  Yes Pisciotta, Elmyra Ricks, PA-C  metoprolol tartrate (LOPRESSOR) 25 MG tablet Take 0.5 tablets (12.5 mg total) by mouth 2 (two) times daily. 02/11/17  Yes Hosie Poisson, MD  NOVOLOG 100 UNIT/ML injection Inject 0-30 Units into the skin 3 (three) times daily with meals. Sliding Scale:  >300 15 units < 300 do not use  01/10/15  Yes [provider]  oxcarbazepine (TRILEPTAL) 600 MG tablet Take 600 mg by mouth 2 (two) times daily. 04/12/16  Yes [provider]  pantoprazole (PROTONIX) 40 MG tablet  Take 1 tablet (40 mg total) by mouth daily. 02/11/17  Yes Hosie Poisson, MD  PROAIR HFA 108 (90 BASE) MCG/ACT inhaler Inhale 2 puffs into the lungs every 6 (six) hours as needed for shortness of breath.  01/10/15  Yes [provider]  simvastatin (ZOCOR) 40 MG tablet Take 40 mg by mouth every evening.  04/25/16  Yes [provider]  tamsulosin (FLOMAX) 0.4 MG CAPS capsule Take 0.4 mg by mouth daily. 04/12/16  Yes [provider]  zolpidem (AMBIEN) 10 MG tablet Take 10 mg by mouth at bedtime as needed for sleep. 04/26/16  Yes [provider]  clindamycin (CLEOCIN) 300 MG capsule Take 1 capsule (300 mg total) by mouth 3 (three) times daily. Patient not taking: Reported on 08/10/2017 02/11/17   Hosie Poisson, MD  collagenase (SANTYL) ointment Apply 1 application topically daily. Patient not taking: Reported on 08/10/2017 03/16/17   Waynetta Sandy, MD  feeding supplement, GLUCERNA SHAKE, (GLUCERNA SHAKE) LIQD Take 237 mLs by mouth 3 (three) times daily between meals. Patient not taking: Reported on 08/10/2017 02/11/17  Hosie Poisson, MD  nicotine (NICODERM CQ - DOSED IN MG/24 HOURS) 21 mg/24hr patch Place 1 patch (21 mg total) onto the skin daily. Patient not taking: Reported on 08/10/2017 02/12/17   Hosie Poisson, MD  oxyCODONE (ROXICODONE) 5 MG immediate release tablet Take 1 tablet (5 mg total) by mouth every 8 (eight) hours as needed for severe pain. Patient not taking: Reported on 08/10/2017 02/11/17   Hosie Poisson, MD  oxyCODONE-acetaminophen (PERCOCET/ROXICET) 5-325 MG tablet Take 1-2 tablets by mouth every 4 (four) hours as needed for severe pain. Patient not taking: Reported on 08/10/2017 03/04/17   Waynetta Sandy, MD    Physical Exam: Vitals:   08/11/17 0215 08/11/17 0230 08/11/17 0300 08/11/17 0400  BP: 130/82 (!) 148/92 (!) 143/97 (!) 134/96  Pulse: 99 (!) 102 (!) 101 97  Resp: 15 17 20 18   Temp:    98 F (36.7 C)  TempSrc:    Oral  SpO2:  100% 100% 100% 99%  Weight:      Height:       General: Not in acute distress HEENT:       Eyes: PERRL, EOMI, no scleral icterus.       ENT: No discharge from the ears and nose, no pharynx injection, no tonsillar enlargement.        Neck: No JVD, no bruit, no mass felt. Heme: No neck lymph node enlargement. Cardiac: S1/S2, RRR, No murmurs, No gallops or rubs. Respiratory:  No rales, wheezing, rhonchi or rubs. GI: Soft, nondistended, nontender, no rebound pain, no organomegaly, BS present. GU: No hematuria Ext: No pitting leg edema bilaterally. 1+DP/PT pulse bilaterally. Three toes missing in left foot (3rd, 4th and 5th), left great toe is erythematous, tender, purple discolored. Musculoskeletal: No joint deformities, No joint redness or warmth, no limitation of ROM in spin. Skin: No rashes.  Neuro: Alert, oriented X3, cranial nerves II-XII grossly intact, moves all extremities normally.  Marland Kitchen Psych: Patient is not psychotic, no suicidal or hemocidal ideation.  Labs on Admission: I have personally reviewed following labs and imaging studies  CBC:  Recent Labs Lab 08/10/17 2212  WBC 11.6*  NEUTROABS 6.6  HGB 11.5*  HCT 33.2*  MCV 88.5  PLT 970   Basic Metabolic Panel:  Recent Labs Lab 08/10/17 2212  NA 134*  K 3.8  CL 103  CO2 23  GLUCOSE 332*  BUN 14  CREATININE 1.44*  CALCIUM 8.7*  MG 1.8   GFR: Estimated Creatinine Clearance: 55.4 mL/min (A) (by C-G formula based on SCr of 1.44 mg/dL (H)). Liver Function Tests:  Recent Labs Lab 08/10/17 2212  AST 11*  ALT 6*  ALKPHOS 74  BILITOT 0.3  PROT 6.5  ALBUMIN 3.2*   No results for input(s): LIPASE, AMYLASE in the last 168 hours. No results for input(s): AMMONIA in the last 168 hours. Coagulation Profile:  Recent Labs Lab 08/10/17 2212  INR 0.91   Cardiac Enzymes: No results for input(s): CKTOTAL, CKMB, CKMBINDEX, TROPONINI in the last 168 hours. BNP (last 3 results) No results for input(s): PROBNP in  the last 8760 hours. HbA1C: No results for input(s): HGBA1C in the last 72 hours. CBG: No results for input(s): GLUCAP in the last 168 hours. Lipid Profile: No results for input(s): CHOL, HDL, LDLCALC, TRIG, CHOLHDL, LDLDIRECT in the last 72 hours. Thyroid Function Tests: No results for input(s): TSH, T4TOTAL, FREET4, T3FREE, THYROIDAB in the last 72 hours. Anemia Panel: No results for input(s): VITAMINB12, FOLATE, FERRITIN, TIBC, IRON, RETICCTPCT  in the last 72 hours. Urine analysis:    Component Value Date/Time   COLORURINE YELLOW 08/10/2017 2318   APPEARANCEUR CLEAR 08/10/2017 2318   LABSPEC 1.026 08/10/2017 2318   PHURINE 5.0 08/10/2017 2318   GLUCOSEU >=500 (A) 08/10/2017 2318   HGBUR NEGATIVE 08/10/2017 2318   [place] Number Two 08/10/2017 2318   [place] 08/10/2017 2318   PROTEINUR 30 (A) 08/10/2017 2318   UROBILINOGEN 1.0 02/26/2015 1152   NITRITE NEGATIVE 08/10/2017 2318   LEUKOCYTESUR NEGATIVE 08/10/2017 2318   Sepsis Labs: @LABRCNTIP (procalcitonin:4,lacticidven:4) )No results found for this or any previous visit (from the past 240 hour(s)).   Radiological Exams on Admission: No results found.   EKG: Independently reviewed.  Sinus syndrome, frequent PVC, low voltage.  Assessment/Plan Principal Problem:   Great toe pain, left Active Problems:   Type II diabetes mellitus with renal manifestations, uncontrolled (HCC)   HLD (hyperlipidemia)   Tobacco abuse   Essential hypertension   GERD (gastroesophageal reflux disease)   Anxiety   Sepsis (HCC)   Nausea vomiting and diarrhea   Left great toe discoloration and pain and PVD: likely due to PVD and ischemia. Per EDP, pt's DP/PT pulse could be detected by doppler. Pt has leukocytosis, tachycardia. She reported she had fever of 104 at home, taking together patient meets criteria for sepsis, indicating possible infection. Lactic acid is normal. Hemodynamically stable. [facility], Dr. Eden Lathe was consulted.  -  will admit to tele bed as inpt - IV vanco and zosyn were started in ED, will continue - PRN Zofran for nausea, dilarudid and Percocet for pain - Blood cultures x 2  - will get Procalcitonin and trend lactic acid levels per sepsis protocol. - IVF: 2.0 L of NS bolus in ED, followed by 100 cc/h - will get ABI -hold plavix in case pt needs procedure  Nausea vomiting and diarrhea: Etiology is not clear. Likely due to viral gastroenteritis, but the patient used clindamycin a month ago, will need to rule out C. difficile colitis. -IV fluid as above -Zofran for nause -Follow-up C. difficile PCR and GI pathogen panel  Type II diabetes mellitus with renal manifestations, uncontrolled ([place]): Last A1c 11.8, poorly controled. Patient is taking NovoLog, metformin and Lantus at home -will decrease Lantus dose from 30 units 3 times a day to 25 units twice a day   -SSI  HLD; -zocor  HTN: -continue metoprolol -IV hydralazine when necessary  GERD: -Protonix  Tobacco abuse: -Did counseling about importance of quitting smoking -Nicotine patch  Anxiety:  -prn Xanas  DVT ppx: SCD Code Status: Full code Family Communication:   Yes, patient's daughter at bed side Disposition Plan:  Anticipate discharge back to previous home environment Consults called:  Dr. Eden Lathe of [facility] Admission status: Inpatient/tele     Date of Service 08/11/2017    Ivor Costa [facility] Pager [phone]  If 7PM-7AM, please contact night-coverage [email or web address] Password [email or web address] 08/11/2017, 4:45 AM

## 2017-08-10 NOTE — ED Triage Notes (Signed)
Pt reports dusky/purple great toe since yesterday. States hx of same resulting in amputation. States emesis, diarrhea and weakness over the last fever days. States burning sensation to L leg from knee down.

## 2017-08-11 ENCOUNTER — Encounter (HOSPITAL_COMMUNITY): Payer: Self-pay | Admitting: General Practice

## 2017-08-11 ENCOUNTER — Inpatient Hospital Stay (HOSPITAL_COMMUNITY): Payer: Medicaid Other

## 2017-08-11 ENCOUNTER — Inpatient Hospital Stay (HOSPITAL_COMMUNITY)
Admit: 2017-08-11 | Discharge: 2017-08-11 | Disposition: A | Discharge status: Home / Self Care | Payer: Medicaid Other | Attending: Internal Medicine | Admitting: Internal Medicine

## 2017-08-11 DIAGNOSIS — I10 Essential (primary) hypertension: Secondary | ICD-10-CM

## 2017-08-11 DIAGNOSIS — F419 Anxiety disorder, unspecified: Secondary | ICD-10-CM | POA: Diagnosis present

## 2017-08-11 DIAGNOSIS — K219 Gastro-esophageal reflux disease without esophagitis: Secondary | ICD-10-CM

## 2017-08-11 DIAGNOSIS — E119 Type 2 diabetes mellitus without complications: Secondary | ICD-10-CM

## 2017-08-11 DIAGNOSIS — R197 Diarrhea, unspecified: Secondary | ICD-10-CM | POA: Diagnosis not present

## 2017-08-11 DIAGNOSIS — E1129 Type 2 diabetes mellitus with other diabetic kidney complication: Secondary | ICD-10-CM | POA: Diagnosis present

## 2017-08-11 DIAGNOSIS — M79675 Pain in left toe(s): Secondary | ICD-10-CM | POA: Diagnosis present

## 2017-08-11 DIAGNOSIS — A084 Viral intestinal infection, unspecified: Secondary | ICD-10-CM | POA: Diagnosis present

## 2017-08-11 DIAGNOSIS — Z720 Tobacco use: Secondary | ICD-10-CM

## 2017-08-11 DIAGNOSIS — R112 Nausea with vomiting, unspecified: Secondary | ICD-10-CM | POA: Diagnosis not present

## 2017-08-11 DIAGNOSIS — K047 Periapical abscess without sinus: Secondary | ICD-10-CM | POA: Diagnosis present

## 2017-08-11 DIAGNOSIS — A419 Sepsis, unspecified organism: Secondary | ICD-10-CM

## 2017-08-11 DIAGNOSIS — E785 Hyperlipidemia, unspecified: Secondary | ICD-10-CM | POA: Diagnosis present

## 2017-08-11 DIAGNOSIS — Z79899 Other long term (current) drug therapy: Secondary | ICD-10-CM | POA: Diagnosis not present

## 2017-08-11 DIAGNOSIS — F209 Schizophrenia, unspecified: Secondary | ICD-10-CM | POA: Diagnosis present

## 2017-08-11 DIAGNOSIS — Z89422 Acquired absence of other left toe(s): Secondary | ICD-10-CM | POA: Diagnosis not present

## 2017-08-11 DIAGNOSIS — E1165 Type 2 diabetes mellitus with hyperglycemia: Secondary | ICD-10-CM

## 2017-08-11 DIAGNOSIS — E1151 Type 2 diabetes mellitus with diabetic peripheral angiopathy without gangrene: Secondary | ICD-10-CM | POA: Diagnosis present

## 2017-08-11 DIAGNOSIS — Z794 Long term (current) use of insulin: Secondary | ICD-10-CM | POA: Diagnosis not present

## 2017-08-11 DIAGNOSIS — I998 Other disorder of circulatory system: Secondary | ICD-10-CM

## 2017-08-11 DIAGNOSIS — F319 Bipolar disorder, unspecified: Secondary | ICD-10-CM | POA: Diagnosis present

## 2017-08-11 DIAGNOSIS — Z7902 Long term (current) use of antithrombotics/antiplatelets: Secondary | ICD-10-CM | POA: Diagnosis not present

## 2017-08-11 DIAGNOSIS — F17210 Nicotine dependence, cigarettes, uncomplicated: Secondary | ICD-10-CM | POA: Diagnosis present

## 2017-08-11 DIAGNOSIS — M79674 Pain in right toe(s): Secondary | ICD-10-CM

## 2017-08-11 LAB — MRSA PCR SCREENING: MRSA by PCR: NEGATIVE

## 2017-08-11 LAB — GLUCOSE, CAPILLARY
GLUCOSE-CAPILLARY: 237 mg/dL — AB (ref 65–99)
GLUCOSE-CAPILLARY: 145 mg/dL — AB (ref 65–99)
GLUCOSE-CAPILLARY: 182 mg/dL — AB (ref 65–99)
Glucose-Capillary: 221 mg/dL — ABNORMAL HIGH (ref 65–99)

## 2017-08-11 LAB — HIV ANTIBODY (ROUTINE TESTING W REFLEX): HIV SCREEN 4TH GENERATION: NONREACTIVE

## 2017-08-11 LAB — HCG, QUANTITATIVE, PREGNANCY

## 2017-08-11 LAB — CBC
HEMATOCRIT: 29 % — AB (ref 36.0–46.0)
Hemoglobin: 10 g/dL — ABNORMAL LOW (ref 12.0–15.0)
MCH: 30.4 pg (ref 26.0–34.0)
MCHC: 34.5 g/dL (ref 30.0–36.0)
MCV: 88.1 fL (ref 78.0–100.0)
Platelets: 194 10*3/uL (ref 150–400)
RBC: 3.29 MIL/uL — ABNORMAL LOW (ref 3.87–5.11)
RDW: 12.4 % (ref 11.5–15.5)
WBC: 11.4 10*3/uL — ABNORMAL HIGH (ref 4.0–10.5)

## 2017-08-11 LAB — BASIC METABOLIC PANEL
Anion gap: 8 (ref 5–15)
BUN: 14 mg/dL (ref 6–20)
CALCIUM: 7.7 mg/dL — AB (ref 8.9–10.3)
CO2: 23 mmol/L (ref 22–32)
Chloride: 107 mmol/L (ref 101–111)
Creatinine, Ser: 1.39 mg/dL — ABNORMAL HIGH (ref 0.44–1.00)
GFR calc Af Amer: 55 mL/min — ABNORMAL LOW (ref >60)
GFR, EST NON AFRICAN AMERICAN: 47 mL/min — AB (ref >60)
GLUCOSE: 189 mg/dL — AB (ref 65–99)
Potassium: 3.6 mmol/L (ref 3.5–5.1)
Sodium: 138 mmol/L (ref 135–145)

## 2017-08-11 LAB — APTT: aPTT: 29 seconds (ref 24–36)

## 2017-08-11 LAB — MAGNESIUM: Magnesium: 1.8 mg/dL (ref 1.7–2.4)

## 2017-08-11 LAB — PROCALCITONIN: Procalcitonin: <0.1 ng/mL

## 2017-08-11 LAB — LACTIC ACID, PLASMA: LACTIC ACID, VENOUS: 1.2 mmol/L (ref 0.5–1.9)

## 2017-08-11 MED ORDER — INSULIN ASPART 100 UNIT/ML ~~LOC~~ SOLN
0.0000 [IU] | Freq: Three times a day (TID) | SUBCUTANEOUS | Status: DC
  Administered 2017-08-11: 1 [IU] via SUBCUTANEOUS
  Administered 2017-08-11: 3 [IU] via SUBCUTANEOUS
  Administered 2017-08-12: 2 [IU] via SUBCUTANEOUS
  Administered 2017-08-12 (×2): 5 [IU] via SUBCUTANEOUS

## 2017-08-11 MED ORDER — ONDANSETRON HCL 4 MG/2ML IJ SOLN: 4.0000 mg | Freq: Three times a day (TID) | INTRAMUSCULAR | Status: DC | PRN

## 2017-08-11 MED ORDER — HYDROMORPHONE HCL 1 MG/ML IJ SOLN
1.0000 mg | INTRAMUSCULAR | Status: DC | PRN
  Administered 2017-08-11 – 2017-08-12 (×4): 1 mg via INTRAVENOUS
  Filled 2017-08-11 (×5): qty 1

## 2017-08-11 MED ORDER — PIPERACILLIN-TAZOBACTAM 3.375 G IVPB
3.3750 g | Freq: Three times a day (TID) | INTRAVENOUS | Status: DC
  Administered 2017-08-11 – 2017-08-12 (×4): 3.375 g via INTRAVENOUS
  Filled 2017-08-11 (×6): qty 50

## 2017-08-11 MED ORDER — MORPHINE SULFATE (PF) 4 MG/ML IV SOLN
2.0000 mg | INTRAVENOUS | Status: DC | PRN
  Administered 2017-08-11: 2 mg via INTRAVENOUS
  Filled 2017-08-11: qty 1

## 2017-08-11 MED ORDER — HYDRALAZINE HCL 20 MG/ML IJ SOLN: 5.0000 mg | INTRAMUSCULAR | Status: DC | PRN

## 2017-08-11 MED ORDER — VANCOMYCIN HCL IN DEXTROSE 750-5 MG/150ML-% IV SOLN
750.0000 mg | Freq: Two times a day (BID) | INTRAVENOUS | Status: DC
  Administered 2017-08-11 – 2017-08-12 (×3): 750 mg via INTRAVENOUS
  Filled 2017-08-11 (×4): qty 150

## 2017-08-11 MED ORDER — ASPIRIN EC 81 MG PO TBEC
81.0000 mg | DELAYED_RELEASE_TABLET | Freq: Every day | ORAL | Status: DC
  Administered 2017-08-11 – 2017-08-12 (×2): 81 mg via ORAL
  Filled 2017-08-11 (×2): qty 1

## 2017-08-11 NOTE — Progress Notes (Signed)
   abi reviewed with triphasic waveforms and mild reduction. She has a palpable dp on the left and has healed her wound and she has no new evidence of ischemia. I am unsure of the cause of her pain but it does not seem vascular in nature and given the above findings no intervention planned. Needs aspirin and statin.   Tyrees Chopin C. Donzetta Matters, MD [facility] Office: [phone] Pager: [phone]

## 2017-08-11 NOTE — ED Notes (Signed)
Pt requesting to speak to MD regarding her results. States that she and the vascular surgeon "got into it, so I didn't get a lot of information from him." pt Notified that Blaine Hamper MD is aware of her request, and will try to come and see her.

## 2017-08-11 NOTE — Progress Notes (Signed)
From [facility] MD aspect no vascular surgery. Med doctor paged for diet order and update

## 2017-08-11 NOTE — ED Notes (Signed)
Attempted to call report

## 2017-08-11 NOTE — Consult Note (Signed)
Referring Physician: [place]  Patient name: Kathy Phelps MRN: [id number] DOB: July 07, 1981 Sex: female  REASON FOR CONSULT: left leg pain and toe pain  HPI: Kathy Phelps is a 36 y.o. female s/p left popliteal and tibial intervention by Dr Donzetta Matters 5/18.  Post revascularization she was able to have amputation of 3 toes which eventually healed.  Pt has had several days of worsening left calf pain to the point she is having trouble walking and states she cannot even bend the knee without pain.  She has a history of poorly controlled diabetes.  According to her 500 is a good day but she routinely has glucose as high as 800.  She is a PPD smoker and has tried to quit in the past but says she "she needs something more than patches".  She has a history of medical non compliance in the past.  She was last seen at our office in May and has never returned for follow up.  She states she has had fever up to 104 at home but has been afebrile today in the ER.  She also complains of some left flank pain.  Past Medical History:  Diagnosis Date  . Bipolar affective ([place])   . Complete miscarriage   . Depression   . Diabetes mellitus   . Hyperlipidemia   . Hypertension   . Pregnancy complication    HELP Syndrome  . Renal disorder   . Schizophrenia [facility])    Past Surgical History:  Procedure Laterality Date  . ABDOMINAL AORTOGRAM W/LOWER EXTREMITY N/A 02/03/2017   Procedure: Abdominal Aortogram w/Lower Extremity;  Surgeon: Waynetta Sandy, MD;  Location: [place] [facility];  Service: Cardiovascular;  Laterality: N/A;  . AMPUTATION Left 02/10/2017   Procedure: AMPUTATION TOES 3, 4 AND 5  LEFT FOOT;  Surgeon: Waynetta Sandy, MD;  Location: [place];  Service: Vascular;  Laterality: Left;  . CESAREAN SECTION    . CHOLECYSTECTOMY    . PERIPHERAL VASCULAR ATHERECTOMY  02/03/2017   Procedure: Peripheral Vascular Atherectomy;  Surgeon: Waynetta Sandy, MD;  Location: [place] [facility];   Service: Cardiovascular;;  . PERIPHERAL VASCULAR BALLOON ANGIOPLASTY  02/03/2017   Procedure: Peripheral Vascular Balloon Angioplasty;  Surgeon: Waynetta Sandy, MD;  Location: [place] [facility];  Service: Cardiovascular;;  . TUBAL LIGATION      Family History  Problem Relation Age of Onset  . Diabetes Mellitus II Mother   . Heart disease Mother   . Heart disease Father     SOCIAL HISTORY: Social History   Social History  . Marital status: Single    Spouse name: N/A  . Number of children: N/A  . Years of education: N/A   Occupational History  . Not on file.   Social History Main Topics  . Smoking status: Current Every Day Smoker    Packs/day: 1.00    Years: 28.00    Types: Cigarettes  . Smokeless tobacco: Never Used  . Alcohol use No  . Drug use: No  . Sexual activity: Yes    Birth control/ protection: None   Other Topics Concern  . Not on file   Social History Narrative  . No narrative on file    Allergies  Allergen Reactions  . Hydrocodone-Acetaminophen Hives  . Tylenol [Acetaminophen] Itching and Swelling    SWELLING REACTION UNSPECIFIED     Current Facility-Administered Medications  Medication Dose Route Frequency Provider Last Rate Last Dose  . 0.9 %  sodium chloride infusion   Intravenous Continuous Ivor Costa, MD 100 mL/hr at 08/11/17 0158    . albuterol (PROVENTIL) (2.5 MG/3ML) 0.083% nebulizer solution 2.5 mg  2.5 mg Nebulization Q4H PRN Ivor Costa, MD      . ALPRAZolam Duanne Moron) tablet 2 mg  2 mg Oral QID Ivor Costa, MD      . gabapentin (NEURONTIN) capsule 800 mg  800 mg Oral BID Ivor Costa, MD      . hydrALAZINE (APRESOLINE) injection 5 mg  5 mg Intravenous Q2H PRN Ivor Costa, MD      . ibuprofen (ADVIL,MOTRIN) tablet 200 mg  200 mg Oral Q6H PRN Ivor Costa, MD      . insulin aspart (novoLOG) injection 0-9 Units  0-9 Units Subcutaneous TID WC Ivor Costa, MD      . insulin glargine (LANTUS) injection 20 Units  20 Units Subcutaneous BID Ivor Costa, MD      . lurasidone (LATUDA) tablet 120 mg  120 mg Oral QHS Ivor Costa, MD      . metoprolol tartrate (LOPRESSOR) tablet 12.5 mg  12.5 mg Oral BID Ivor Costa, MD      . mometasone-formoterol [facility]) 100-5 MCG/ACT inhaler 2 puff  2 puff Inhalation BID Ivor Costa, MD      . morphine 4 MG/ML injection 2 mg  2 mg Intravenous Q4H PRN Ivor Costa, MD   2 mg at 08/11/17 0152  . nicotine (NICODERM CQ - dosed in mg/24 hours) patch 21 mg  21 mg Transdermal Daily Ivor Costa, MD      . ondansetron [facility]) injection 4 mg  4 mg Intravenous Q8H PRN Ivor Costa, MD      . Oxcarbazepine (TRILEPTAL) tablet 600 mg  600 mg Oral BID Ivor Costa, MD      . oxyCODONE (Oxy IR/ROXICODONE) immediate release tablet 5 mg  5 mg Oral Q4H PRN Ivor Costa, MD   5 mg at 08/11/17 0033  . pantoprazole (PROTONIX) EC tablet 40 mg  40 mg Oral Daily Ivor Costa, MD      . piperacillin-tazobactam (ZOSYN) IVPB 3.375 g  3.375 g Intravenous Q8H Janett Billow, RPH      . simvastatin (ZOCOR) tablet 40 mg  40 mg Oral QPM Ivor Costa, MD      . tamsulosin (FLOMAX) capsule 0.4 mg  0.4 mg Oral Daily Ivor Costa, MD      . vancomycin (VANCOCIN) IVPB 750 mg/150 ml premix  750 mg Intravenous Q12H Janett Billow, RPH      . zolpidem (AMBIEN) tablet 5 mg  5 mg Oral QHS PRN Ivor Costa, MD       Current Outpatient Prescriptions  Medication Sig Dispense Refill  . ADVAIR DISKUS 100-50 MCG/DOSE AEPB Inhale 1 puff into the lungs daily.  5  . alprazolam (XANAX) 2 MG tablet Take 2 mg by mouth 4 (four) times daily.     . clopidogrel (PLAVIX) 75 MG tablet Take 1 tablet (75 mg total) by mouth daily with breakfast. 30 tablet 0  . gabapentin (NEURONTIN) 800 MG tablet Take 1 tablet (800 mg total) by mouth 2 (two) times daily. (Patient taking differently: Take 800 mg by mouth 5 (five) times daily. ) 60 tablet 0  . insulin glargine (LANTUS) 100 UNIT/ML injection Inject 0.1 mLs (10 Units total) into the skin 2 (two) times daily. (Patient taking  differently: Inject 30 Units into the skin 2 (two) times daily. ) 10 mL 11  . LATUDA 120 MG  TABS Take 120 mg by mouth at bedtime.  1  . metFORMIN (GLUCOPHAGE) 1000 MG tablet Take 1 tablet (1,000 mg total) by mouth 2 (two) times daily. (Patient taking differently: Take 1,500 mg by mouth 2 (two) times daily. ) 30 tablet 0  . metoprolol tartrate (LOPRESSOR) 25 MG tablet Take 0.5 tablets (12.5 mg total) by mouth 2 (two) times daily. 60 tablet 0  . NOVOLOG 100 UNIT/ML injection Inject 0-30 Units into the skin 3 (three) times daily with meals. Sliding Scale:  >300 15 units < 300 do not use   3  . oxcarbazepine (TRILEPTAL) 600 MG tablet Take 600 mg by mouth 2 (two) times daily.  1  . pantoprazole (PROTONIX) 40 MG tablet Take 1 tablet (40 mg total) by mouth daily. 30 tablet 0  . PROAIR HFA 108 (90 BASE) MCG/ACT inhaler Inhale 2 puffs into the lungs every 6 (six) hours as needed for shortness of breath.   0  . simvastatin (ZOCOR) 40 MG tablet Take 40 mg by mouth every evening.   2  . tamsulosin (FLOMAX) 0.4 MG CAPS capsule Take 0.4 mg by mouth daily.  3  . zolpidem (AMBIEN) 10 MG tablet Take 10 mg by mouth at bedtime as needed for sleep.  2  . clindamycin (CLEOCIN) 300 MG capsule Take 1 capsule (300 mg total) by mouth 3 (three) times daily. (Patient not taking: Reported on 08/10/2017) 21 capsule 0  . collagenase (SANTYL) ointment Apply 1 application topically daily. (Patient not taking: Reported on 08/10/2017) 30 g 1  . feeding supplement, GLUCERNA SHAKE, (GLUCERNA SHAKE) LIQD Take 237 mLs by mouth 3 (three) times daily between meals. (Patient not taking: Reported on 08/10/2017)  0  . nicotine (NICODERM CQ - DOSED IN MG/24 HOURS) 21 mg/24hr patch Place 1 patch (21 mg total) onto the skin daily. (Patient not taking: Reported on 08/10/2017) 28 patch 0  . oxyCODONE (ROXICODONE) 5 MG immediate release tablet Take 1 tablet (5 mg total) by mouth every 8 (eight) hours as needed for severe pain. (Patient not taking:  Reported on 08/10/2017) 8 tablet 0  . oxyCODONE-acetaminophen (PERCOCET/ROXICET) 5-325 MG tablet Take 1-2 tablets by mouth every 4 (four) hours as needed for severe pain. (Patient not taking: Reported on 08/10/2017) 20 tablet 0    ROS:   General:  No weight loss, + Fever, chills  Vascular: No history of rest pain in feet.  + history of claudication.  + history of non-healing ulcer, No history of DVT   Skin: No rashes  Psychological: bipolar and schizophrenia history   Physical Examination  Vitals:   08/11/17 0000 08/11/17 0030 08/11/17 0045 08/11/17 0100  BP: (!) 144/98 (!) 142/91 126/83 125/72  Pulse: 99 (!) 101 (!) 101 (!) 104  Resp: 15 14 19  (!) 22  Temp:      TempSrc:      SpO2: 100% 100% 100% 100%  Weight:      Height:        Body mass index is 25.1 kg/m.  General:  Alert and oriented, no acute distress HEENT: Normal Neck: No JVD Pulmonary: Clear to auscultation bilaterally Cardiac: Regular Rate and Rhythm  Abdomen: Soft, non-tender Skin: No rash, tip of left first toe dusky Extremity Pulses:  2+ radial, brachial, femoral, 1+ left dorsalis pedis, 2+ right dorsalis pedis pulses  Musculoskeletal: No deformity or edema, pain on palpation left posterior calf and popliteal fossa  Neurologic: Upper and lower extremity motor 5/5 and symmetric  DATA:  CBC    Component Value Date/Time   WBC 11.6 (H) 08/10/2017 2212   RBC 3.75 (L) 08/10/2017 2212   HGB 11.5 (L) 08/10/2017 2212   HCT 33.2 (L) 08/10/2017 2212   PLT 228 08/10/2017 2212   MCV 88.5 08/10/2017 2212   MCH 30.7 08/10/2017 2212   MCHC 34.6 08/10/2017 2212   RDW 12.5 08/10/2017 2212   LYMPHSABS 3.7 08/10/2017 2212   MONOABS 0.8 08/10/2017 2212   EOSABS 0.5 08/10/2017 2212   BASOSABS 0.1 08/10/2017 2212    BMET    Component Value Date/Time   NA 134 (L) 08/10/2017 2212   K 3.8 08/10/2017 2212   CL 103 08/10/2017 2212   CO2 23 08/10/2017 2212   GLUCOSE 332 (H) 08/10/2017 2212   BUN 14 08/10/2017  2212   CREATININE 1.44 (H) 08/10/2017 2212   CALCIUM 8.7 (L) 08/10/2017 2212   GFRNONAA 45 (L) 08/10/2017 2212   GFRAA 53 (L) 08/10/2017 2212     ASSESSMENT:  Left first toe ischemia most likely secondary to small vessel disease as she does have a weakly palpable DP pulse and brisk doppler flow.  Not sure what the cause of her calf pain and poor function of her left leg is related too but this is not typical of PAD.  Difficult patient management due to poor insight into her overall condition with continued smoking and poorly controlled diabetes.  This is not acute ischemia.   PLAN:  Will make Dr Donzetta Matters aware of pt admission Would obtain arterial duplex and ABIs May need MRI of left calf to further evaluate pain as I doubt this is related to her PAD May benefit from Endocrinology consult although she says she saw one 2 years ago and it didn't help her diabetes.  Pt made aware of extremely high risk of limb loss if she continues to smoke and have poorly controlled diabetes.   Ruta Hinds, MD [facility] [place] Office: [phone] Pager: [phone]

## 2017-08-11 NOTE — ED Notes (Signed)
Rounded to administer pain meds as patient asked but pt is sleeping. Did not disturb at this time.

## 2017-08-11 NOTE — ED Notes (Signed)
This RN used doppler to find dorsal pulse on left foot. Left great toe has delayed cap refill compared to right great toe. Left great toe also has a small maroon colored spot on it.  This RN was able to palpate the dorsal pulse on the right foot.

## 2017-08-11 NOTE — Progress Notes (Signed)
Pharmacy Antibiotic Note  Kathy Phelps is a 36 y.o. female admitted on 08/10/2017 with sepsis, possibly related to left great toe infection.  Pharmacy has been consulted for vancomycin and zosyn dosing.  WBC elevated at 11.6, afebrile, and LA 1.82. SCr 1.44 for estimated CrCl ~ 55-60 mL/min.   Vancomycin 1g IV x1 and Zosyn 3.375g IV x1 given in ED.   Plan: Vancomycin 750 mg IV q12hr Zosyn 3.375g IV q8hr Vancomycin trough at steady state and PRN (goal 15-20 mcg/mL) Monitor renal function, clinical picture, and culture data F/u length of therapy   Height: 5\' 9"  (175.3 cm) Weight: 170 lb (77.1 kg) IBW/kg (Calculated) : 66.2  Temp (24hrs), Avg:98.9 F (37.2 C), Min:98.9 F (37.2 C), Max:98.9 F (37.2 C)   Recent Labs Lab 08/10/17 2212 08/10/17 2231  WBC 11.6*  --   CREATININE 1.44*  --   LATICACIDVEN  --  1.82    Estimated Creatinine Clearance: 55.4 mL/min (A) (by C-G formula based on SCr of 1.44 mg/dL (H)).    Allergies  Allergen Reactions  . Hydrocodone-Acetaminophen Hives  . Tylenol [Acetaminophen] Itching and Swelling    SWELLING REACTION UNSPECIFIED     Antimicrobials this admission: 11/1 Vanc >>  11/1 Zosyn >>   Microbiology results: pending   Lavonda Jumbo, PharmD Clinical Pharmacist 08/11/17 12:45 AM

## 2017-08-11 NOTE — Progress Notes (Signed)
PROGRESS NOTE    Kathy Phelps  RWE:[id number] DOB: 11/07/1980 DOA: 08/10/2017 PCP: Rubbie Battiest, NP  Outpatient Specialists:     Brief Narrative:  Patient is a 36 y.o. 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 Patient was admitted with left great toe pain and discoloration. Nausea, Vomiting, diarrhea; and fever of 104 with associated dysuria and left flank pain were also reported. Recent use of Clindamycin antibiotics for tooth infection.   Assessment & Plan:   Principal Problem:   Great toe pain, left Active Problems:   Type II diabetes mellitus with renal manifestations, uncontrolled (HCC)   HLD (hyperlipidemia)   Tobacco abuse   Essential hypertension   GERD (gastroesophageal reflux disease)   Anxiety   Sepsis (HCC)   Nausea vomiting and diarrhea   Left great toe discoloration and pain and PVD:  - Worrisome for PVD - Vascular consulted - Arterial Doppler noted (normal on right and mildly abnormal on the left) - will admit to tele bed as inpt -  Further work up as per Vascular team.  Nausea vomiting and diarrhea:  - Etiology is not clear.  - Follow Stool C diff - Manage supportively.  Type II diabetes mellitus with renal manifestations, uncontrolled ([place]): Last A1c 11.8, poorly controled. Patient is taking NovoLog, metformin and Lantus at home - Optimize -SSI  HLD; -zocor  HTN: -continue metoprolol -IV hydralazine when necessary  GERD: -Protonix  Tobacco abuse: -Did counseling about importance of quitting smoking -Nicotine patch  Anxiety:  -Minimize mind altering medications - Patient is sleepy.  DVT ppx: SCD Code Status: Full code Family Communication:   Yes, patient's daughter at bed side Disposition Plan:  Anticipate discharge back to  previous home environment Consults called:  Dr. Eden Lathe of [facility]    Subjective: - Sleepy - No new complaints  Objective: Vitals:   08/11/17 0600 08/11/17 0700 08/11/17 0852 08/11/17 1601  BP: 101/66 100/68 95/72 109/63  Pulse: 89 84 80 87  Resp: 13 13 14 18   Temp:   98.4 F (36.9 C) 98.7 F (37.1 C)  TempSrc:   Oral Oral  SpO2: 91% 93% 97% 100%  Weight:      Height:        Intake/Output Summary (Last 24 hours) at 08/11/17 1743 Last data filed at 08/11/17 1623  Gross per 24 hour  Intake                0 ml  Output               50 ml  Net              -50 ml   Filed Weights   08/10/17 2205  Weight: 77.1 kg (170 lb)    Examination:  General exam: Sleeping but easily arousable Respiratory system: Clear to auscultation. Respiratory effort normal. Cardiovascular system: S1 & S2. No pedal edema. Gastrointestinal system: Abdomen is nondistended, soft and nontender. No organomegaly or masses felt. Normal bowel sounds heard. Central nervous system: Alert and oriented. No focal neurological deficits. Extremities: Amputation of left 3rd, 4th and 5th toes.  Data Reviewed: I have personally reviewed following labs and imaging studies  CBC:  Recent Labs Lab 08/10/17 2212 08/11/17 0401  WBC 11.6* 11.4*  NEUTROABS 6.6  --   HGB 11.5* 10.0*  HCT 33.2* 29.0*  MCV 88.5 88.1  PLT 228 102   Basic Metabolic Panel:  Recent Labs Lab 08/10/17 2212 08/11/17 0401  NA 134* 138  K 3.8 3.6  CL 103 107  CO2 23 23  GLUCOSE 332* 189*  BUN 14 14  CREATININE 1.44* 1.39*  CALCIUM 8.7* 7.7*  MG 1.8  --    GFR: Estimated Creatinine Clearance: 57.3 mL/min (A) (by C-G formula based on SCr of 1.39 mg/dL (H)). Liver Function Tests:  Recent Labs Lab 08/10/17 2212  AST 11*  ALT 6*  ALKPHOS 74  BILITOT 0.3  PROT 6.5  ALBUMIN 3.2*   No results for input(s): LIPASE, AMYLASE in the last 168 hours. No results for input(s): AMMONIA in the last 168 hours. Coagulation  Profile:  Recent Labs Lab 08/10/17 2212  INR 0.91   Cardiac Enzymes: No results for input(s): CKTOTAL, CKMB, CKMBINDEX, TROPONINI in the last 168 hours. BNP (last 3 results) No results for input(s): PROBNP in the last 8760 hours. HbA1C: No results for input(s): HGBA1C in the last 72 hours. CBG:  Recent Labs Lab 08/11/17 0944 08/11/17 1200 08/11/17 1700  GLUCAP 237* 221* 145*   Lipid Profile: No results for input(s): CHOL, HDL, LDLCALC, TRIG, CHOLHDL, LDLDIRECT in the last 72 hours. Thyroid Function Tests: No results for input(s): TSH, T4TOTAL, FREET4, T3FREE, THYROIDAB in the last 72 hours. Anemia Panel: No results for input(s): VITAMINB12, FOLATE, FERRITIN, TIBC, IRON, RETICCTPCT in the last 72 hours. Urine analysis:    Component Value Date/Time   COLORURINE YELLOW 08/10/2017 2318   APPEARANCEUR CLEAR 08/10/2017 2318   LABSPEC 1.026 08/10/2017 2318   PHURINE 5.0 08/10/2017 2318   GLUCOSEU >=500 (A) 08/10/2017 2318   HGBUR NEGATIVE 08/10/2017 2318   [place] 08/10/2017 2318   [place] 08/10/2017 2318   PROTEINUR 30 (A) 08/10/2017 2318   UROBILINOGEN 1.0 02/26/2015 1152   NITRITE NEGATIVE 08/10/2017 2318   LEUKOCYTESUR NEGATIVE 08/10/2017 2318   Sepsis Labs: @LABRCNTIP (procalcitonin:4,lacticidven:4)  ) Recent Results (from the past 240 hour(s))  MRSA PCR Screening     Status: None   Collection Time: 08/11/17  9:01 AM  Result Value Ref Range Status   MRSA by PCR NEGATIVE NEGATIVE Final    Comment:        The GeneXpert MRSA Assay (FDA approved for NASAL specimens only), is one component of a comprehensive MRSA colonization surveillance program. It is not intended to diagnose MRSA infection nor to guide or monitor treatment for MRSA infections.          Radiology Studies: No results found.      Scheduled Meds: . alprazolam  2 mg Oral QID  . gabapentin  800 mg Oral BID  . insulin aspart  0-9 Units Subcutaneous TID WC  .  insulin glargine  20 Units Subcutaneous BID  . lurasidone  120 mg Oral QHS  . metoprolol tartrate  12.5 mg Oral BID  . mometasone-formoterol  2 puff Inhalation BID  . nicotine  21 mg Transdermal Daily  . oxcarbazepine  600 mg Oral BID  . pantoprazole  40 mg Oral Daily  . simvastatin  40 mg Oral QPM  . tamsulosin  0.4 mg Oral Daily   Continuous Infusions: . sodium chloride 100 mL/hr at 08/11/17 0700  . piperacillin-tazobactam (ZOSYN)  IV Stopped (08/11/17 1643)  . vancomycin Stopped (08/11/17 1541)     LOS: 0 days    Time spent: 20 Minutes.    Dana Allan, MD  [facility] Pager #: [phone] 7PM-7AM contact night  coverage as above

## 2017-08-12 ENCOUNTER — Inpatient Hospital Stay (HOSPITAL_COMMUNITY): Payer: Medicaid Other

## 2017-08-12 DIAGNOSIS — M79675 Pain in left toe(s): Secondary | ICD-10-CM

## 2017-08-12 LAB — GLUCOSE, CAPILLARY
GLUCOSE-CAPILLARY: 180 mg/dL — AB (ref 65–99)
GLUCOSE-CAPILLARY: 293 mg/dL — AB (ref 65–99)
GLUCOSE-CAPILLARY: 294 mg/dL — AB (ref 65–99)

## 2017-08-12 MED ORDER — NOVOLOG 100 UNIT/ML ~~LOC~~ SOLN: 0.0000 [IU] | Freq: Three times a day (TID) | SUBCUTANEOUS | 3 refills | Status: DC

## 2017-08-12 MED ORDER — FAMOTIDINE 20 MG PO TABS: 20.0000 mg | ORAL_TABLET | Freq: Two times a day (BID) | ORAL | 0 refills | Status: DC

## 2017-08-12 MED ORDER — ALPRAZOLAM 2 MG PO TABS: 2.0000 mg | ORAL_TABLET | Freq: Four times a day (QID) | ORAL | 0 refills | Status: DC | PRN

## 2017-08-12 MED ORDER — INSULIN GLARGINE 100 UNIT/ML ~~LOC~~ SOLN: 30.0000 [IU] | Freq: Two times a day (BID) | SUBCUTANEOUS | 0 refills | Status: DC

## 2017-08-12 MED ORDER — INSULIN GLARGINE 100 UNIT/ML ~~LOC~~ SOLN: 20.0000 [IU] | Freq: Two times a day (BID) | SUBCUTANEOUS | 0 refills | Status: DC

## 2017-08-12 MED ORDER — "INSULIN SYRINGE 31G X 5/16"" 0.5 ML MISC": 1.0000 | Freq: Four times a day (QID) | 1 refills | Status: DC

## 2017-08-12 MED ORDER — FAMOTIDINE 20 MG PO TABS
20.0000 mg | ORAL_TABLET | Freq: Two times a day (BID) | ORAL | Status: DC
  Administered 2017-08-12: 20 mg via ORAL
  Filled 2017-08-12: qty 1

## 2017-08-12 NOTE — Progress Notes (Signed)
LLE arterial duplex completed.

## 2017-08-12 NOTE — Discharge Instructions (Signed)

## 2017-08-12 NOTE — Progress Notes (Signed)
  Progress Note    08/12/2017 6:17 PM * No surgery found *  Subjective:  Still having left leg pain  Vitals:   08/12/17 0906 08/12/17 1400  BP:  128/88  Pulse:  94  Resp:  18  Temp:  98.2 F (36.8 C)  SpO2: 97% 98%    Physical Exam: aaox3 Palpable left dp Wound has healed Tip of left great toe is dusky but blanches  CBC    Component Value Date/Time   WBC 11.4 (H) 08/11/2017 0401   RBC 3.29 (L) 08/11/2017 0401   HGB 10.0 (L) 08/11/2017 0401   HCT 29.0 (L) 08/11/2017 0401   PLT 194 08/11/2017 0401   MCV 88.1 08/11/2017 0401   MCH 30.4 08/11/2017 0401   MCHC 34.5 08/11/2017 0401   RDW 12.4 08/11/2017 0401   LYMPHSABS 3.7 08/10/2017 2212   MONOABS 0.8 08/10/2017 2212   EOSABS 0.5 08/10/2017 2212   BASOSABS 0.1 08/10/2017 2212    BMET    Component Value Date/Time   NA 138 08/11/2017 0401   K 3.6 08/11/2017 0401   CL 107 08/11/2017 0401   CO2 23 08/11/2017 0401   GLUCOSE 189 (H) 08/11/2017 0401   BUN 14 08/11/2017 0401   CREATININE 1.39 (H) 08/11/2017 0401   CALCIUM 7.7 (L) 08/11/2017 0401   GFRNONAA 47 (L) 08/11/2017 0401   GFRAA 55 (L) 08/11/2017 0401    INR    Component Value Date/Time   INR 0.91 08/10/2017 2212     Intake/Output Summary (Last 24 hours) at 08/12/17 1817 Last data filed at 08/12/17 1651  Gross per 24 hour  Intake             2973 ml  Output                0 ml  Net             2973 ml     Assessment:  36 y.o. female is s/p previous left lower extremity intervention now with left leg pain that does not appear to be ischemic in nature.   Plan: Duplex reviewed with recurrent stenosis but with triphasic flow distally and a palpable dp would not pursue intervention at this time. She has agreed to follow up in 2-3 months with repeat duplex and abi for evaluation as outpatient. She needs to have aspirin and statin at home. Her working mobile number is [place] [facility] [place] Donzetta Matters, MD [facility]  [place] Office: [phone] Pager: [phone]  08/12/2017 6:17 PM

## 2017-08-12 NOTE — Discharge Summary (Signed)
Pt d/c home via private vehicle. Pt request walker prior to leaving. CN called case manager and said to print out order and give to pt and can pick up at [facility]. Pt at last minute as discharging them requested pains meds for home. MD called and told patient would have to pick up tomorrow from from desk around 0900. Pt verbalizes understanding of d/c instructions and ABS discussed, per MD ok  to give dose of oxycodone prior to leaving, pt not driving. Transported by family

## 2017-08-12 NOTE — Progress Notes (Signed)
Per MD did not mean to do script for xanax. Shredded and put in patient privacy bin. Discharged ordered placed awaiting scripts and since patient needs coverage for dinner will discharge patient after dinner and this is when ride is available

## 2017-08-12 NOTE — Discharge Summary (Signed)
Physician Discharge Summary  Kathy Phelps YJE:[id number] DOB: Oct 01, 1981 DOA: 08/10/2017  PCP: Rubbie Battiest, NP  Admit date: 08/10/2017 Discharge date: 08/12/2017  Admitted From: home  Disposition:  home   Recommendations for Outpatient Follow-up:  1. F/u on glucose control Discharge Condition:  stable   CODE STATUS:  Full code   Consultations:  Vascular surgery-     Discharge Diagnoses:  Principal Problem:   Great toe pain, left Active Problems:   Nausea vomiting and diarrhea   Type II diabetes mellitus with renal manifestations, uncontrolled (HCC)   HLD (hyperlipidemia)   Tobacco abuse   Essential hypertension   GERD (gastroesophageal reflux disease)   Anxiety    Subjective: Admits to heart burn even when she is taking daily Protonix. No vomiting or nausea since being in the hospital. She had some diarrhea when she was in the ER but none since.  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 Patient was admitted with left great toe pain and discoloration,Nausea, Vomiting, diarrhea and subjective fever of 104 with associated dysuria and left flank pain. Recent use of Clindamycin antibiotics for tooth infection.   Hospital Course:  Left great toe discoloration and pain and PVD: - Concern for PVD on admission but pain noted to be mostly in calf and not consistent with PVD - Vascular consulted and evaluated by Dr Donnetta Hutching and Dr Donzetta Matters - Arterial Doppler noted (normal on right and mildly abnormal on the left) - Dr Donzetta Matters is recommending to send home without antibiotics. He will f/u with her in 3 months.  Nausea vomiting and diarrhea: - Etiology is not clear but on exam today, she is noted to have epigastric tenderness-she states she is having severe  heartburn despite taking Protonix-  will add Pepcid to Protonix - she has had no further episodes of diarrhea- she is tolerating solid food well  Fever? Mild leukocytosis (11.6) - blood cultures negative, UA negative, no respiratory illness - no further vomiting or stools in the hospital  Type II diabetes mellitus with renal manifestations, uncontrolled (HCC):Last A1c 11.8, poorly controled. Patient is taking NovoLog, metformin and Lantusat home - need f/u with PCP for optimization  HLD; -zocor  HTN: -continue metoprolol    GERD: -Protonix  Tobacco abuse: -Did counseling about importance of quitting smoking -Nicotine patch  Anxiety: -Minimize mind altering medications - Patient is sleepy.  Discharge Instructions  Discharge Instructions    Diet - low sodium heart healthy    Complete by:  As directed    Diet Carb Modified    Complete by:  As directed    Increase activity slowly    Complete by:  As directed      Allergies as of 08/12/2017      Reactions   Hydrocodone-acetaminophen Hives   Tylenol [acetaminophen] Itching, Swelling   SWELLING REACTION UNSPECIFIED       Medication List    STOP taking these medications   clindamycin 300 MG capsule Commonly known as:  CLEOCIN   collagenase ointment Commonly known as:  SANTYL   feeding supplement (GLUCERNA SHAKE) Liqd   oxyCODONE 5 MG immediate release tablet Commonly known as:  ROXICODONE   oxyCODONE-acetaminophen 5-325 MG tablet Commonly known as:  PERCOCET/ROXICET     TAKE these medications   ADVAIR DISKUS 100-50 MCG/DOSE Aepb Generic drug:  Fluticasone-Salmeterol Inhale 1  puff into the lungs daily.   alprazolam 2 MG tablet Commonly known as:  XANAX Take 1 tablet (2 mg total) by mouth 4 (four) times daily as needed for sleep. What changed:  when to take this  reasons to take this   clopidogrel 75 MG tablet Commonly known as:  PLAVIX Take 1 tablet (75 mg total) by mouth daily with  breakfast.   famotidine 20 MG tablet Commonly known as:  PEPCID Take 1 tablet (20 mg total) by mouth 2 (two) times daily.   gabapentin 800 MG tablet Commonly known as:  NEURONTIN Take 1 tablet (800 mg total) by mouth 2 (two) times daily. What changed:  when to take this   insulin glargine 100 UNIT/ML injection Commonly known as:  LANTUS Inject 0.1 mLs (10 Units total) into the skin 2 (two) times daily. What changed:  how much to take   LATUDA 120 MG Tabs Generic drug:  Lurasidone HCl Take 120 mg by mouth at bedtime.   metFORMIN 1000 MG tablet Commonly known as:  GLUCOPHAGE Take 1 tablet (1,000 mg total) by mouth 2 (two) times daily. What changed:  how much to take   metoprolol tartrate 25 MG tablet Commonly known as:  LOPRESSOR Take 0.5 tablets (12.5 mg total) by mouth 2 (two) times daily.   nicotine 21 mg/24hr patch Commonly known as:  NICODERM CQ - dosed in mg/24 hours Place 1 patch (21 mg total) onto the skin daily.   NOVOLOG 100 UNIT/ML injection Generic drug:  insulin aspart Inject 0-30 Units into the skin 3 (three) times daily with meals. Sliding Scale:  >300 15 units < 300 do not use   oxcarbazepine 600 MG tablet Commonly known as:  TRILEPTAL Take 600 mg by mouth 2 (two) times daily.   pantoprazole 40 MG tablet Commonly known as:  PROTONIX Take 1 tablet (40 mg total) by mouth daily.   PROAIR HFA 108 (90 Base) MCG/ACT inhaler Generic drug:  albuterol Inhale 2 puffs into the lungs every 6 (six) hours as needed for shortness of breath.   simvastatin 40 MG tablet Commonly known as:  ZOCOR Take 40 mg by mouth every evening.   tamsulosin 0.4 MG Caps capsule Commonly known as:  FLOMAX Take 0.4 mg by mouth daily.   zolpidem 10 MG tablet Commonly known as:  AMBIEN Take 10 mg by mouth at bedtime as needed for sleep.      Follow-up Information    Rubbie Battiest, NP Follow up in 2 week(s).   Specialty:  Nurse Practitioner Contact  information: [place] [address] [phone]        Waynetta Sandy, MD Follow up in 3 month(s).   Specialties:  Vascular Surgery, Cardiology Why:  he would like to see you in 3 months Contact information: [address] [place] [address] [phone]          Allergies  Allergen Reactions  . Hydrocodone-Acetaminophen Hives  . Tylenol [Acetaminophen] Itching and Swelling    SWELLING REACTION UNSPECIFIED      Procedures/Studies:    No results found.     Discharge Exam: Vitals:   08/12/17 0906 08/12/17 1400  BP:  128/88  Pulse:  94  Resp:  18  Temp:  98.2 F (36.8 C)  SpO2: 97% 98%   Vitals:   08/11/17 2146 08/12/17 0523 08/12/17 0906 08/12/17 1400  BP: 102/63 130/86  128/88  Pulse: 91 96  94  Resp: 18 18  18  Temp: 98.2 F (36.8 C) 99.5 F (37.5 C)  98.2 F (36.8 C)  TempSrc: Oral Oral  Oral  SpO2: 95% 97% 97% 98%  Weight:      Height:        General: Pt is alert, awake, not in acute distress Cardiovascular: RRR, S1/S2 +, no rubs, no gallops Respiratory: CTA bilaterally, no wheezing, no rhonchi Abdominal: Soft, NT, ND, bowel sounds + Extremities: no edema, no cyanosis    The results of significant diagnostics from this hospitalization (including imaging, microbiology, ancillary and laboratory) are listed below for reference.     Microbiology: Recent Results (from the past 240 hour(s))  Culture, blood (Routine x 2)     Status: None (Preliminary result)   Collection Time: 08/10/17 10:00 PM  Result Value Ref Range Status   Specimen Description BLOOD LEFT ARM  Final   Special Requests   Final    BOTTLES DRAWN AEROBIC AND ANAEROBIC Blood Culture results may not be optimal due to an excessive volume of blood received in culture bottles   Culture NO GROWTH 1 DAY  Final   Report Status PENDING  Incomplete  Culture, blood (Routine x 2)     Status: None (Preliminary result)   Collection Time: 08/10/17 10:30 PM   Result Value Ref Range Status   Specimen Description BLOOD RIGHT ARM  Final   Special Requests IN PEDIATRIC BOTTLE Blood Culture adequate volume  Final   Culture NO GROWTH 1 DAY  Final   Report Status PENDING  Incomplete  MRSA PCR Screening     Status: None   Collection Time: 08/11/17  9:01 AM  Result Value Ref Range Status   MRSA by PCR NEGATIVE NEGATIVE Final    Comment:        The GeneXpert MRSA Assay (FDA approved for NASAL specimens only), is one component of a comprehensive MRSA colonization surveillance program. It is not intended to diagnose MRSA infection nor to guide or monitor treatment for MRSA infections.      Labs: BNP (last 3 results) No results for input(s): BNP in the last 8760 hours. Basic Metabolic Panel:  Recent Labs Lab 08/10/17 2212 08/11/17 0401  NA 134* 138  K 3.8 3.6  CL 103 107  CO2 23 23  GLUCOSE 332* 189*  BUN 14 14  CREATININE 1.44* 1.39*  CALCIUM 8.7* 7.7*  MG 1.8  --    Liver Function Tests:  Recent Labs Lab 08/10/17 2212  AST 11*  ALT 6*  ALKPHOS 74  BILITOT 0.3  PROT 6.5  ALBUMIN 3.2*   No results for input(s): LIPASE, AMYLASE in the last 168 hours. No results for input(s): AMMONIA in the last 168 hours. CBC:  Recent Labs Lab 08/10/17 2212 08/11/17 0401  WBC 11.6* 11.4*  NEUTROABS 6.6  --   HGB 11.5* 10.0*  HCT 33.2* 29.0*  MCV 88.5 88.1  PLT 228 194   Cardiac Enzymes: No results for input(s): CKTOTAL, CKMB, CKMBINDEX, TROPONINI in the last 168 hours. BNP: Invalid input(s): POCBNP CBG:  Recent Labs Lab 08/11/17 1200 08/11/17 1700 08/11/17 2107 08/12/17 0740 08/12/17 1220  GLUCAP 221* 145* 182* 180* 293*   D-Dimer No results for input(s): DDIMER in the last 72 hours. Hgb A1c No results for input(s): HGBA1C in the last 72 hours. Lipid Profile No results for input(s): CHOL, HDL, LDLCALC, TRIG, CHOLHDL, LDLDIRECT in the last 72 hours. Thyroid function studies No results for input(s): TSH, T4TOTAL,  T3FREE, THYROIDAB in the last 72 hours.  Invalid input(s): FREET3 Anemia work up No results for input(s): VITAMINB12, FOLATE, FERRITIN, TIBC, IRON, RETICCTPCT in the last 72 hours. Urinalysis    Component Value Date/Time   COLORURINE YELLOW 08/10/2017 2318   APPEARANCEUR CLEAR 08/10/2017 2318   LABSPEC 1.026 08/10/2017 2318   PHURINE 5.0 08/10/2017 2318   GLUCOSEU >=500 (A) 08/10/2017 2318   HGBUR NEGATIVE 08/10/2017 2318   [place] 08/10/2017 2318   [place] 08/10/2017 2318   PROTEINUR 30 (A) 08/10/2017 2318   UROBILINOGEN 1.0 02/26/2015 1152   NITRITE NEGATIVE 08/10/2017 2318   LEUKOCYTESUR NEGATIVE 08/10/2017 2318   Sepsis Labs Invalid input(s): PROCALCITONIN,  WBC,  LACTICIDVEN Microbiology Recent Results (from the past 240 hour(s))  Culture, blood (Routine x 2)     Status: None (Preliminary result)   Collection Time: 08/10/17 10:00 PM  Result Value Ref Range Status   Specimen Description BLOOD LEFT ARM  Final   Special Requests   Final    BOTTLES DRAWN AEROBIC AND ANAEROBIC Blood Culture results may not be optimal due to an excessive volume of blood received in culture bottles   Culture NO GROWTH 1 DAY  Final   Report Status PENDING  Incomplete  Culture, blood (Routine x 2)     Status: None (Preliminary result)   Collection Time: 08/10/17 10:30 PM  Result Value Ref Range Status   Specimen Description BLOOD RIGHT ARM  Final   Special Requests IN PEDIATRIC BOTTLE Blood Culture adequate volume  Final   Culture NO GROWTH 1 DAY  Final   Report Status PENDING  Incomplete  MRSA PCR Screening     Status: None   Collection Time: 08/11/17  9:01 AM  Result Value Ref Range Status   MRSA by PCR NEGATIVE NEGATIVE Final    Comment:        The GeneXpert MRSA Assay (FDA approved for NASAL specimens only), is one component of a comprehensive MRSA colonization surveillance program. It is not intended to diagnose MRSA infection nor to guide or monitor  treatment for MRSA infections.      Time coordinating discharge: Over 30 minutes  SIGNED:   Debbe Odea, MD  [facility] 08/12/2017, 3:29 PM Pager   If 7PM-7AM, please contact night-coverage [email or web address] Password [email or web address]

## 2017-08-15 ENCOUNTER — Other Ambulatory Visit: Payer: Self-pay

## 2017-08-15 ENCOUNTER — Telehealth: Payer: Self-pay | Admitting: Vascular Surgery

## 2017-08-15 DIAGNOSIS — M79605 Pain in left leg: Secondary | ICD-10-CM

## 2017-08-15 DIAGNOSIS — I739 Peripheral vascular disease, unspecified: Secondary | ICD-10-CM

## 2017-08-15 NOTE — Telephone Encounter (Signed)
Sched appt 10/21/17; lab at 3:00 and MD at 4:00. Mailed appt letter.

## 2017-08-15 NOTE — Telephone Encounter (Signed)
-----   Message from Mena Goes, RN sent at 08/12/2017  7:18 PM EDT ----- Regarding: 2-3 months   ----- Message ----- From: Waynetta Sandy, MD Sent: 08/12/2017   6:21 PM To: [address]  Kathy Phelps [id number] [birth year]  Inpatient level 1 Dx: pad  F/u in 2-3 months with left leg duplex and abi's  Working number is [phone]

## 2017-08-16 LAB — CULTURE, BLOOD (ROUTINE X 2)
CULTURE: NO GROWTH
Culture: NO GROWTH
Special Requests: ADEQUATE

## 2017-08-26 ENCOUNTER — Other Ambulatory Visit: Payer: Self-pay

## 2017-08-26 ENCOUNTER — Encounter (HOSPITAL_COMMUNITY): Payer: Self-pay | Admitting: *Deleted

## 2017-08-26 ENCOUNTER — Emergency Department (HOSPITAL_COMMUNITY)
Admission: EM | Admit: 2017-08-26 | Discharge: 2017-08-27 | Disposition: A | Discharge status: Home / Self Care | Payer: Medicaid Other | Attending: Emergency Medicine | Admitting: Emergency Medicine

## 2017-08-26 DIAGNOSIS — Z5321 Procedure and treatment not carried out due to patient leaving prior to being seen by health care provider: Secondary | ICD-10-CM | POA: Diagnosis not present

## 2017-08-26 DIAGNOSIS — M79672 Pain in left foot: Secondary | ICD-10-CM | POA: Diagnosis present

## 2017-08-26 LAB — BASIC METABOLIC PANEL
Anion gap: 8 (ref 5–15)
BUN: 15 mg/dL (ref 6–20)
CHLORIDE: 101 mmol/L (ref 101–111)
CO2: 26 mmol/L (ref 22–32)
CREATININE: 1.55 mg/dL — AB (ref 0.44–1.00)
Calcium: 8.5 mg/dL — ABNORMAL LOW (ref 8.9–10.3)
GFR, EST AFRICAN AMERICAN: 48 mL/min — AB (ref >60)
GFR, EST NON AFRICAN AMERICAN: 42 mL/min — AB (ref >60)
Glucose, Bld: 271 mg/dL — ABNORMAL HIGH (ref 65–99)
Potassium: 3.7 mmol/L (ref 3.5–5.1)
SODIUM: 135 mmol/L (ref 135–145)

## 2017-08-26 LAB — URINALYSIS, ROUTINE W REFLEX MICROSCOPIC
Ketones, ur: NEGATIVE mg/dL
Nitrite: NEGATIVE
PROTEIN: 100 mg/dL — AB
SPECIFIC GRAVITY, URINE: 1.025 (ref 1.005–1.030)
pH: 5 (ref 5.0–8.0)

## 2017-08-26 LAB — CBC
HCT: 31.8 % — ABNORMAL LOW (ref 36.0–46.0)
Hemoglobin: 10.8 g/dL — ABNORMAL LOW (ref 12.0–15.0)
MCH: 30.9 pg (ref 26.0–34.0)
MCHC: 34 g/dL (ref 30.0–36.0)
MCV: 90.9 fL (ref 78.0–100.0)
Platelets: 262 10*3/uL (ref 150–400)
RBC: 3.5 MIL/uL — ABNORMAL LOW (ref 3.87–5.11)
RDW: 12.8 % (ref 11.5–15.5)
WBC: 12.8 10*3/uL — AB (ref 4.0–10.5)

## 2017-08-26 LAB — CBG MONITORING, ED: GLUCOSE-CAPILLARY: 255 mg/dL — AB (ref 65–99)

## 2017-08-26 NOTE — ED Notes (Signed)
No answer x3 for room 

## 2017-08-26 NOTE — ED Notes (Signed)
Unable to locate patient x 1 in lobby

## 2017-08-26 NOTE — ED Notes (Signed)
No answer x2 for a room 

## 2017-08-26 NOTE — ED Triage Notes (Signed)
Pt reports x 3 wounds to the L foot where her 3rd, 4th, & 5th toes were amputated 7 mths ago, pt reports wounds opened yesterday, bleeding controlled at this time, pt has L big toe discoloration with purple skin, pt A&O x4

## 2017-08-29 ENCOUNTER — Encounter (HOSPITAL_COMMUNITY): Payer: Self-pay | Admitting: Emergency Medicine

## 2017-08-29 DIAGNOSIS — Z5321 Procedure and treatment not carried out due to patient leaving prior to being seen by health care provider: Secondary | ICD-10-CM | POA: Diagnosis not present

## 2017-08-29 DIAGNOSIS — M79672 Pain in left foot: Secondary | ICD-10-CM | POA: Insufficient documentation

## 2017-08-29 LAB — CBC WITH DIFFERENTIAL/PLATELET
Basophils Absolute: 0.1 10*3/uL (ref 0.0–0.1)
Basophils Relative: 1 %
EOS ABS: 0.6 10*3/uL (ref 0.0–0.7)
EOS PCT: 5 %
HCT: 31.6 % — ABNORMAL LOW (ref 36.0–46.0)
HEMOGLOBIN: 10.6 g/dL — AB (ref 12.0–15.0)
LYMPHS ABS: 4 10*3/uL (ref 0.7–4.0)
LYMPHS PCT: 33 %
MCH: 29.9 pg (ref 26.0–34.0)
MCHC: 33.5 g/dL (ref 30.0–36.0)
MCV: 89.3 fL (ref 78.0–100.0)
MONOS PCT: 8 %
Monocytes Absolute: 1 10*3/uL (ref 0.1–1.0)
NEUTROS PCT: 53 %
Neutro Abs: 6.5 10*3/uL (ref 1.7–7.7)
Platelets: 275 10*3/uL (ref 150–400)
RBC: 3.54 MIL/uL — AB (ref 3.87–5.11)
RDW: 12.2 % (ref 11.5–15.5)
WBC: 12.1 10*3/uL — AB (ref 4.0–10.5)

## 2017-08-29 LAB — I-STAT CG4 LACTIC ACID, ED: Lactic Acid, Venous: 1.7 mmol/L (ref 0.5–1.9)

## 2017-08-29 NOTE — ED Triage Notes (Signed)
Pt reports L foot pain X1 week, had amputation 7 months ago to L foot and states that the wound reopened 1 week ago. Pt came last week but didn't stay d/t wait

## 2017-08-30 ENCOUNTER — Emergency Department (HOSPITAL_COMMUNITY)
Admission: EM | Admit: 2017-08-30 | Discharge: 2017-08-30 | Disposition: A | Discharge status: Home / Self Care | Payer: Medicaid Other | Attending: Emergency Medicine | Admitting: Emergency Medicine

## 2017-08-30 LAB — COMPREHENSIVE METABOLIC PANEL
ALK PHOS: 63 U/L (ref 38–126)
ALT: 5 U/L — AB (ref 14–54)
ANION GAP: 7 (ref 5–15)
AST: 14 U/L — ABNORMAL LOW (ref 15–41)
Albumin: 2.7 g/dL — ABNORMAL LOW (ref 3.5–5.0)
BILIRUBIN TOTAL: 0.2 mg/dL — AB (ref 0.3–1.2)
BUN: 15 mg/dL (ref 6–20)
CALCIUM: 8.7 mg/dL — AB (ref 8.9–10.3)
CO2: 25 mmol/L (ref 22–32)
CREATININE: 1.47 mg/dL — AB (ref 0.44–1.00)
Chloride: 103 mmol/L (ref 101–111)
GFR, EST AFRICAN AMERICAN: 51 mL/min — AB (ref >60)
GFR, EST NON AFRICAN AMERICAN: 44 mL/min — AB (ref >60)
Glucose, Bld: 194 mg/dL — ABNORMAL HIGH (ref 65–99)
Potassium: 4.2 mmol/L (ref 3.5–5.1)
Sodium: 135 mmol/L (ref 135–145)
TOTAL PROTEIN: 6.5 g/dL (ref 6.5–8.1)

## 2017-08-30 NOTE — ED Notes (Signed)
No response in lobby for vitals

## 2017-08-30 NOTE — ED Notes (Signed)
No response from pt in the lobby

## 2017-09-02 ENCOUNTER — Emergency Department (HOSPITAL_COMMUNITY): Payer: Medicaid Other

## 2017-09-02 ENCOUNTER — Inpatient Hospital Stay (HOSPITAL_COMMUNITY)
Admission: EM | Admit: 2017-09-02 | Discharge: 2017-09-08 | DRG: 254 | Disposition: A | Discharge status: Home / Self Care | Payer: Medicaid Other | Attending: Internal Medicine | Admitting: Internal Medicine

## 2017-09-02 ENCOUNTER — Other Ambulatory Visit: Payer: Self-pay

## 2017-09-02 ENCOUNTER — Encounter (HOSPITAL_COMMUNITY): Payer: Self-pay

## 2017-09-02 DIAGNOSIS — R1310 Dysphagia, unspecified: Secondary | ICD-10-CM | POA: Diagnosis present

## 2017-09-02 DIAGNOSIS — I739 Peripheral vascular disease, unspecified: Secondary | ICD-10-CM | POA: Diagnosis not present

## 2017-09-02 DIAGNOSIS — M79675 Pain in left toe(s): Secondary | ICD-10-CM | POA: Diagnosis present

## 2017-09-02 DIAGNOSIS — Z79899 Other long term (current) drug therapy: Secondary | ICD-10-CM

## 2017-09-02 DIAGNOSIS — E1121 Type 2 diabetes mellitus with diabetic nephropathy: Secondary | ICD-10-CM | POA: Diagnosis present

## 2017-09-02 DIAGNOSIS — Z9119 Patient's noncompliance with other medical treatment and regimen: Secondary | ICD-10-CM | POA: Diagnosis not present

## 2017-09-02 DIAGNOSIS — E785 Hyperlipidemia, unspecified: Secondary | ICD-10-CM | POA: Diagnosis present

## 2017-09-02 DIAGNOSIS — E1165 Type 2 diabetes mellitus with hyperglycemia: Secondary | ICD-10-CM | POA: Diagnosis present

## 2017-09-02 DIAGNOSIS — E1129 Type 2 diabetes mellitus with other diabetic kidney complication: Secondary | ICD-10-CM | POA: Diagnosis not present

## 2017-09-02 DIAGNOSIS — E1140 Type 2 diabetes mellitus with diabetic neuropathy, unspecified: Secondary | ICD-10-CM | POA: Diagnosis present

## 2017-09-02 DIAGNOSIS — Z833 Family history of diabetes mellitus: Secondary | ICD-10-CM | POA: Diagnosis not present

## 2017-09-02 DIAGNOSIS — I129 Hypertensive chronic kidney disease with stage 1 through stage 4 chronic kidney disease, or unspecified chronic kidney disease: Secondary | ICD-10-CM | POA: Diagnosis present

## 2017-09-02 DIAGNOSIS — R112 Nausea with vomiting, unspecified: Secondary | ICD-10-CM | POA: Diagnosis not present

## 2017-09-02 DIAGNOSIS — E1143 Type 2 diabetes mellitus with diabetic autonomic (poly)neuropathy: Secondary | ICD-10-CM | POA: Diagnosis present

## 2017-09-02 DIAGNOSIS — Z7902 Long term (current) use of antithrombotics/antiplatelets: Secondary | ICD-10-CM | POA: Diagnosis not present

## 2017-09-02 DIAGNOSIS — IMO0002 Reserved for concepts with insufficient information to code with codable children: Secondary | ICD-10-CM | POA: Diagnosis present

## 2017-09-02 DIAGNOSIS — K219 Gastro-esophageal reflux disease without esophagitis: Secondary | ICD-10-CM | POA: Diagnosis present

## 2017-09-02 DIAGNOSIS — I998 Other disorder of circulatory system: Secondary | ICD-10-CM | POA: Diagnosis present

## 2017-09-02 DIAGNOSIS — K56609 Unspecified intestinal obstruction, unspecified as to partial versus complete obstruction: Secondary | ICD-10-CM

## 2017-09-02 DIAGNOSIS — F319 Bipolar disorder, unspecified: Secondary | ICD-10-CM | POA: Diagnosis present

## 2017-09-02 DIAGNOSIS — E1151 Type 2 diabetes mellitus with diabetic peripheral angiopathy without gangrene: Principal | ICD-10-CM | POA: Diagnosis present

## 2017-09-02 DIAGNOSIS — Z89422 Acquired absence of other left toe(s): Secondary | ICD-10-CM

## 2017-09-02 DIAGNOSIS — D649 Anemia, unspecified: Secondary | ICD-10-CM | POA: Diagnosis present

## 2017-09-02 DIAGNOSIS — K3184 Gastroparesis: Secondary | ICD-10-CM | POA: Diagnosis present

## 2017-09-02 DIAGNOSIS — T8189XS Other complications of procedures, not elsewhere classified, sequela: Secondary | ICD-10-CM | POA: Diagnosis not present

## 2017-09-02 DIAGNOSIS — Z794 Long term (current) use of insulin: Secondary | ICD-10-CM

## 2017-09-02 DIAGNOSIS — L97529 Non-pressure chronic ulcer of other part of left foot with unspecified severity: Secondary | ICD-10-CM | POA: Diagnosis present

## 2017-09-02 DIAGNOSIS — Z8249 Family history of ischemic heart disease and other diseases of the circulatory system: Secondary | ICD-10-CM | POA: Diagnosis not present

## 2017-09-02 DIAGNOSIS — F17210 Nicotine dependence, cigarettes, uncomplicated: Secondary | ICD-10-CM | POA: Diagnosis present

## 2017-09-02 DIAGNOSIS — E1122 Type 2 diabetes mellitus with diabetic chronic kidney disease: Secondary | ICD-10-CM | POA: Diagnosis present

## 2017-09-02 DIAGNOSIS — N182 Chronic kidney disease, stage 2 (mild): Secondary | ICD-10-CM | POA: Diagnosis present

## 2017-09-02 DIAGNOSIS — E11621 Type 2 diabetes mellitus with foot ulcer: Secondary | ICD-10-CM | POA: Diagnosis present

## 2017-09-02 DIAGNOSIS — J449 Chronic obstructive pulmonary disease, unspecified: Secondary | ICD-10-CM | POA: Diagnosis present

## 2017-09-02 DIAGNOSIS — T8189XA Other complications of procedures, not elsewhere classified, initial encounter: Secondary | ICD-10-CM | POA: Diagnosis present

## 2017-09-02 DIAGNOSIS — I10 Essential (primary) hypertension: Secondary | ICD-10-CM | POA: Diagnosis present

## 2017-09-02 LAB — BASIC METABOLIC PANEL
Anion gap: 7 (ref 5–15)
BUN: 20 mg/dL (ref 6–20)
CHLORIDE: 105 mmol/L (ref 101–111)
CO2: 23 mmol/L (ref 22–32)
Calcium: 8.8 mg/dL — ABNORMAL LOW (ref 8.9–10.3)
Creatinine, Ser: 1.37 mg/dL — ABNORMAL HIGH (ref 0.44–1.00)
GFR calc Af Amer: 56 mL/min — ABNORMAL LOW (ref >60)
GFR calc non Af Amer: 48 mL/min — ABNORMAL LOW (ref >60)
GLUCOSE: 293 mg/dL — AB (ref 65–99)
POTASSIUM: 4.4 mmol/L (ref 3.5–5.1)
SODIUM: 135 mmol/L (ref 135–145)

## 2017-09-02 LAB — CBC WITH DIFFERENTIAL/PLATELET
Basophils Absolute: 0 10*3/uL (ref 0.0–0.1)
Basophils Relative: 0 %
Eosinophils Absolute: 0.6 10*3/uL (ref 0.0–0.7)
Eosinophils Relative: 5 %
HEMATOCRIT: 33.1 % — AB (ref 36.0–46.0)
HEMOGLOBIN: 11.5 g/dL — AB (ref 12.0–15.0)
LYMPHS ABS: 2.6 10*3/uL (ref 0.7–4.0)
LYMPHS PCT: 20 %
MCH: 30.7 pg (ref 26.0–34.0)
MCHC: 34.7 g/dL (ref 30.0–36.0)
MCV: 88.3 fL (ref 78.0–100.0)
Monocytes Absolute: 0.9 10*3/uL (ref 0.1–1.0)
Monocytes Relative: 7 %
NEUTROS PCT: 68 %
Neutro Abs: 8.8 10*3/uL — ABNORMAL HIGH (ref 1.7–7.7)
Platelets: 263 10*3/uL (ref 150–400)
RBC: 3.75 MIL/uL — AB (ref 3.87–5.11)
RDW: 12.3 % (ref 11.5–15.5)
WBC: 12.9 10*3/uL — AB (ref 4.0–10.5)

## 2017-09-02 LAB — C-REACTIVE PROTEIN: CRP: 3.5 mg/dL — ABNORMAL HIGH (ref <1.0)

## 2017-09-02 LAB — SEDIMENTATION RATE: Sed Rate: 72 mm/hr — ABNORMAL HIGH (ref 0–22)

## 2017-09-02 LAB — GLUCOSE, CAPILLARY
GLUCOSE-CAPILLARY: 217 mg/dL — AB (ref 65–99)
Glucose-Capillary: 241 mg/dL — ABNORMAL HIGH (ref 65–99)

## 2017-09-02 LAB — I-STAT CG4 LACTIC ACID, ED: LACTIC ACID, VENOUS: 0.8 mmol/L (ref 0.5–1.9)

## 2017-09-02 LAB — PREALBUMIN: PREALBUMIN: 15.3 mg/dL — AB (ref 18–38)

## 2017-09-02 MED ORDER — DOCUSATE SODIUM 100 MG PO CAPS
100.0000 mg | ORAL_CAPSULE | Freq: Two times a day (BID) | ORAL | Status: DC
  Administered 2017-09-02 – 2017-09-06 (×8): 100 mg via ORAL
  Filled 2017-09-02 (×8): qty 1

## 2017-09-02 MED ORDER — PANTOPRAZOLE SODIUM 40 MG PO TBEC
40.0000 mg | DELAYED_RELEASE_TABLET | Freq: Every day | ORAL | Status: DC
  Administered 2017-09-02 – 2017-09-06 (×5): 40 mg via ORAL
  Filled 2017-09-02 (×5): qty 1

## 2017-09-02 MED ORDER — FAMOTIDINE 20 MG PO TABS
20.0000 mg | ORAL_TABLET | Freq: Two times a day (BID) | ORAL | Status: DC
  Administered 2017-09-02 – 2017-09-08 (×12): 20 mg via ORAL
  Filled 2017-09-02 (×12): qty 1

## 2017-09-02 MED ORDER — VANCOMYCIN HCL IN DEXTROSE 1-5 GM/200ML-% IV SOLN
1000.0000 mg | Freq: Once | INTRAVENOUS | Status: AC
  Administered 2017-09-02: 1000 mg via INTRAVENOUS
  Filled 2017-09-02: qty 200

## 2017-09-02 MED ORDER — HYDROMORPHONE HCL 1 MG/ML IJ SOLN
0.5000 mg | Freq: Once | INTRAMUSCULAR | Status: AC
  Administered 2017-09-02: 0.5 mg via INTRAVENOUS
  Filled 2017-09-02: qty 1

## 2017-09-02 MED ORDER — ALBUTEROL SULFATE (2.5 MG/3ML) 0.083% IN NEBU: 3.0000 mL | INHALATION_SOLUTION | Freq: Four times a day (QID) | RESPIRATORY_TRACT | Status: DC | PRN

## 2017-09-02 MED ORDER — LURASIDONE HCL 40 MG PO TABS
120.0000 mg | ORAL_TABLET | Freq: Every day | ORAL | Status: DC
  Administered 2017-09-03 – 2017-09-07 (×5): 120 mg via ORAL
  Filled 2017-09-02 (×6): qty 3

## 2017-09-02 MED ORDER — ENOXAPARIN SODIUM 40 MG/0.4ML ~~LOC~~ SOLN
40.0000 mg | SUBCUTANEOUS | Status: DC
  Administered 2017-09-07: 23:00:00 40 mg via SUBCUTANEOUS
  Filled 2017-09-02 (×5): qty 0.4

## 2017-09-02 MED ORDER — MOMETASONE FURO-FORMOTEROL FUM 100-5 MCG/ACT IN AERO
2.0000 | INHALATION_SPRAY | Freq: Two times a day (BID) | RESPIRATORY_TRACT | Status: DC
  Administered 2017-09-03 – 2017-09-08 (×5): 2 via RESPIRATORY_TRACT
  Filled 2017-09-02 (×2): qty 8.8

## 2017-09-02 MED ORDER — ALPRAZOLAM 0.5 MG PO TABS
2.0000 mg | ORAL_TABLET | Freq: Four times a day (QID) | ORAL | Status: DC | PRN
  Administered 2017-09-03: 2 mg via ORAL
  Filled 2017-09-02 (×2): qty 4

## 2017-09-02 MED ORDER — ONDANSETRON HCL 4 MG/2ML IJ SOLN
4.0000 mg | Freq: Four times a day (QID) | INTRAMUSCULAR | Status: DC | PRN
  Administered 2017-09-03 – 2017-09-05 (×4): 4 mg via INTRAVENOUS
  Filled 2017-09-02 (×4): qty 2

## 2017-09-02 MED ORDER — INSULIN GLARGINE 100 UNIT/ML ~~LOC~~ SOLN: 20.0000 [IU] | Freq: Two times a day (BID) | SUBCUTANEOUS | Status: DC

## 2017-09-02 MED ORDER — METFORMIN HCL 500 MG PO TABS
1500.0000 mg | ORAL_TABLET | Freq: Two times a day (BID) | ORAL | Status: DC
  Administered 2017-09-03 – 2017-09-04 (×3): 1500 mg via ORAL
  Filled 2017-09-02 (×3): qty 3

## 2017-09-02 MED ORDER — METOPROLOL TARTRATE 12.5 MG HALF TABLET
12.5000 mg | ORAL_TABLET | Freq: Two times a day (BID) | ORAL | Status: DC
  Administered 2017-09-02 – 2017-09-08 (×12): 12.5 mg via ORAL
  Filled 2017-09-02 (×12): qty 1

## 2017-09-02 MED ORDER — GABAPENTIN 400 MG PO CAPS
800.0000 mg | ORAL_CAPSULE | Freq: Two times a day (BID) | ORAL | Status: DC
  Administered 2017-09-02 – 2017-09-08 (×13): 800 mg via ORAL
  Filled 2017-09-02 (×13): qty 2

## 2017-09-02 MED ORDER — DEXTROSE 5 % IV SOLN
2.0000 g | INTRAVENOUS | Status: DC
  Administered 2017-09-02: 2 g via INTRAVENOUS
  Filled 2017-09-02: qty 2

## 2017-09-02 MED ORDER — INSULIN ASPART 100 UNIT/ML ~~LOC~~ SOLN
0.0000 [IU] | Freq: Three times a day (TID) | SUBCUTANEOUS | Status: DC
  Administered 2017-09-04: 1 [IU] via SUBCUTANEOUS
  Administered 2017-09-07 – 2017-09-08 (×2): 3 [IU] via SUBCUTANEOUS

## 2017-09-02 MED ORDER — INSULIN GLARGINE 100 UNIT/ML ~~LOC~~ SOLN
20.0000 [IU] | Freq: Every day | SUBCUTANEOUS | Status: DC
  Administered 2017-09-03 – 2017-09-06 (×3): 20 [IU] via SUBCUTANEOUS
  Filled 2017-09-02 (×4): qty 0.2

## 2017-09-02 MED ORDER — MUPIROCIN 2 % EX OINT
TOPICAL_OINTMENT | Freq: Every day | CUTANEOUS | Status: DC
  Administered 2017-09-02: 1 via NASAL
  Administered 2017-09-03 – 2017-09-08 (×6): via NASAL
  Filled 2017-09-02 (×4): qty 22

## 2017-09-02 MED ORDER — INSULIN ASPART 100 UNIT/ML ~~LOC~~ SOLN
0.0000 [IU] | Freq: Every day | SUBCUTANEOUS | Status: DC
  Administered 2017-09-02 – 2017-09-07 (×3): 2 [IU] via SUBCUTANEOUS

## 2017-09-02 MED ORDER — INSULIN GLARGINE 100 UNIT/ML ~~LOC~~ SOLN
30.0000 [IU] | Freq: Every day | SUBCUTANEOUS | Status: DC
  Administered 2017-09-02 – 2017-09-03 (×2): 30 [IU] via SUBCUTANEOUS
  Filled 2017-09-02 (×4): qty 0.3

## 2017-09-02 MED ORDER — ONDANSETRON HCL 4 MG PO TABS: 4.0000 mg | ORAL_TABLET | Freq: Four times a day (QID) | ORAL | Status: DC | PRN

## 2017-09-02 MED ORDER — SIMVASTATIN 40 MG PO TABS
40.0000 mg | ORAL_TABLET | Freq: Every evening | ORAL | Status: DC
  Administered 2017-09-02 – 2017-09-04 (×3): 40 mg via ORAL
  Filled 2017-09-02 (×3): qty 1

## 2017-09-02 MED ORDER — CLOPIDOGREL BISULFATE 75 MG PO TABS
75.0000 mg | ORAL_TABLET | Freq: Every day | ORAL | Status: DC
  Administered 2017-09-03 – 2017-09-06 (×4): 75 mg via ORAL
  Filled 2017-09-02 (×4): qty 1

## 2017-09-02 MED ORDER — ZOLPIDEM TARTRATE 5 MG PO TABS
5.0000 mg | ORAL_TABLET | Freq: Every evening | ORAL | Status: DC | PRN
Start: 2017-09-02 — End: 2017-09-08

## 2017-09-02 MED ORDER — METRONIDAZOLE 500 MG PO TABS
500.0000 mg | ORAL_TABLET | Freq: Three times a day (TID) | ORAL | Status: DC
  Administered 2017-09-02 – 2017-09-04 (×8): 500 mg via ORAL
  Filled 2017-09-02 (×9): qty 1

## 2017-09-02 MED ORDER — NICOTINE 14 MG/24HR TD PT24
14.0000 mg | MEDICATED_PATCH | Freq: Every day | TRANSDERMAL | Status: DC
  Administered 2017-09-02 – 2017-09-04 (×3): 14 mg via TRANSDERMAL
  Filled 2017-09-02 (×6): qty 1

## 2017-09-02 MED ORDER — OXCARBAZEPINE 300 MG PO TABS: 600.0000 mg | ORAL_TABLET | Freq: Two times a day (BID) | ORAL | Status: DC

## 2017-09-02 MED ORDER — DEXTROSE 5 % IV SOLN
2.0000 g | Freq: Three times a day (TID) | INTRAVENOUS | Status: DC
  Administered 2017-09-02 – 2017-09-04 (×6): 2 g via INTRAVENOUS
  Filled 2017-09-02 (×7): qty 2

## 2017-09-02 MED ORDER — VANCOMYCIN HCL IN DEXTROSE 750-5 MG/150ML-% IV SOLN
750.0000 mg | INTRAVENOUS | Status: DC
  Filled 2017-09-02: qty 150

## 2017-09-02 MED ORDER — LURASIDONE HCL 120 MG PO TABS: 120.0000 mg | ORAL_TABLET | Freq: Every day | ORAL | Status: DC

## 2017-09-02 MED ORDER — OXCARBAZEPINE 300 MG PO TABS
600.0000 mg | ORAL_TABLET | Freq: Two times a day (BID) | ORAL | Status: DC
  Administered 2017-09-03 – 2017-09-08 (×11): 600 mg via ORAL
  Filled 2017-09-02 (×13): qty 2

## 2017-09-02 MED ORDER — HYDROMORPHONE HCL 1 MG/ML IJ SOLN
1.0000 mg | INTRAMUSCULAR | Status: DC | PRN
  Administered 2017-09-02 – 2017-09-08 (×21): 1 mg via INTRAVENOUS
  Filled 2017-09-02 (×22): qty 1

## 2017-09-02 NOTE — Consult Note (Signed)
Patient name: Kathy Phelps MRN: [id number] DOB: Oct 17, 1980 Sex: female   REASON FOR CONSULT:    Peripheral vascular disease with left foot wound.  Consult is requested by [facility]  HPI:   Kathy Phelps is a pleasant 36 y.o. female, who is known to our service.  On 02/03/2017 the patient had an angiogram with atherectomy of the left popliteal artery, drug-coated balloon angioplasty of the left popliteal artery and angioplasty of the left anterior tibial artery by Dr. Donzetta Matters.  This was done because she had gangrene on the left third fourth and fifth toes.  On 02/10/2017, the patient had amputation of the left third fourth and fifth toes and placement of a VAC, also by Dr. Donzetta Matters.  These toe amputation sites ultimately healed.  The patient was seen in consultation by Dr. Ruta Hinds on 08/11/2017 with left leg pain and toe pain.  Patient had Doppler studies on 08/11/2017 which showed triphasic waveforms.  At that time she had a palpable dorsalis pedis pulse on the left. The wounds on the foot had healed with no evidence of ischemia.  It was not felt that her pain was secondary to ischemia.  She does have a history of medical noncompliance and does continue to smoke.  She tells me that several days ago the wound on her left foot "split open."  She does not remember any specific trauma to the foot.  Of note, she does continue to smoke.  Her diabetes has been poorly controlled.  Reportedly, she was seen in the emergency department on 11/16 and 11/20 complaining that the wound on her foot it opened and was bleeding.  However she signed out AMA for these 2 visits reportedly.  She returned to the emergency room today with foul-smelling drainage from the open wound and burning pain in her foot.  She reported a fever to 100.7 yesterday.  Past Medical History:  Diagnosis Date  . Bipolar affective ([place])   . Complete miscarriage   . Depression   . Diabetes mellitus   . Hyperlipidemia   .  Hypertension   . Pregnancy complication    HELP Syndrome  . Renal disorder   . Schizophrenia ([place])     Family History  Problem Relation Age of Onset  . Diabetes Mellitus II Mother   . Heart disease Mother   . Heart disease Father     SOCIAL HISTORY: Social History   Socioeconomic History  . Marital status: Single    Spouse name: Not on file  . Number of children: Not on file  . Years of education: Not on file  . Highest education level: Not on file  Social Needs  . Financial resource strain: Not on file  . Food insecurity - worry: Not on file  . Food insecurity - inability: Not on file  . Transportation needs - medical: Not on file  . Transportation needs - non-medical: Not on file  Occupational History  . Not on file  Tobacco Use  . Smoking status: Current Every Day Smoker    Packs/day: 1.00    Years: 28.00    Pack years: 28.00    Types: Cigarettes  . Smokeless tobacco: Never Used  Substance and Sexual Activity  . Alcohol use: No  . Drug use: No  . Sexual activity: Yes    Birth control/protection: None  Other Topics Concern  . Not on file  Social History Narrative  . Not on file    Allergies  Allergen Reactions  . Hydrocodone-Acetaminophen Hives  . Tylenol [Acetaminophen] Itching and Swelling    SWELLING REACTION UNSPECIFIED     Current Facility-Administered Medications  Medication Dose Route Frequency Provider Last Rate Last Dose  . albuterol (PROVENTIL) (2.5 MG/3ML) 0.083% nebulizer solution 3 mL  3 mL Inhalation Q6H PRN Geradine Girt, DO      . ALPRAZolam Duanne Moron) tablet 2 mg  2 mg Oral QID PRN Eulogio Bear U, DO   2 mg at 09/03/17 0026  . ceFEPIme (MAXIPIME) 2 g in dextrose 5 % 50 mL IVPB  2 g Intravenous Q8H Emiliano Dyer, RPH   Stopped at 09/03/17 0230  . clopidogrel (PLAVIX) tablet 75 mg  75 mg Oral Q breakfast Vann, Jessica U, DO      . docusate sodium (COLACE) capsule 100 mg  100 mg Oral BID Vann, Jessica U, DO   100 mg at 09/02/17 2014    . enoxaparin (LOVENOX) injection 40 mg  40 mg Subcutaneous Q24H Vann, Jessica U, DO      . famotidine (PEPCID) tablet 20 mg  20 mg Oral BID Eulogio Bear U, DO   20 mg at 09/02/17 2014  . gabapentin (NEURONTIN) capsule 800 mg  800 mg Oral BID Eulogio Bear U, DO   800 mg at 09/02/17 2014  . HYDROmorphone (DILAUDID) injection 1 mg  1 mg Intravenous Q4H PRN Eulogio Bear U, DO   1 mg at 09/03/17 0537  . insulin aspart (novoLOG) injection 0-5 Units  0-5 Units Subcutaneous QHS Geradine Girt, DO   2 Units at 09/02/17 2104  . insulin aspart (novoLOG) injection 0-9 Units  0-9 Units Subcutaneous TID WC Vann, Jessica U, DO      . insulin glargine (LANTUS) injection 20 Units  20 Units Subcutaneous Daily Vann, Jessica U, DO       And  . insulin glargine (LANTUS) injection 30 Units  30 Units Subcutaneous QHS Eulogio Bear U, DO   30 Units at 09/02/17 2104  . lurasidone (LATUDA) tablet 120 mg  120 mg Oral QHS Vann, Jessica U, DO      . metFORMIN (GLUCOPHAGE) tablet 1,500 mg  1,500 mg Oral BID WC Vann, Jessica U, DO      . metoprolol tartrate (LOPRESSOR) tablet 12.5 mg  12.5 mg Oral BID Vann, Jessica U, DO   12.5 mg at 09/02/17 2015  . metroNIDAZOLE (FLAGYL) tablet 500 mg  500 mg Oral Q8H Pisciotta, Nicole, PA-C   500 mg at 09/03/17 0537  . mometasone-formoterol (DULERA) 100-5 MCG/ACT inhaler 2 puff  2 puff Inhalation BID Vann, Jessica U, DO      . mupirocin ointment (BACTROBAN) 2 %   Nasal Daily Angelia Mould, MD   1 application at 87/56/43 1903  . nicotine (NICODERM CQ - dosed in mg/24 hours) patch 14 mg  14 mg Transdermal Daily Eulogio Bear U, DO   14 mg at 09/02/17 2015  . ondansetron (ZOFRAN) tablet 4 mg  4 mg Oral Q6H PRN Eulogio Bear U, DO       Or  . ondansetron (ZOFRAN) injection 4 mg  4 mg Intravenous Q6H PRN Eulogio Bear U, DO      . Oxcarbazepine (TRILEPTAL) tablet 600 mg  600 mg Oral BID Vann, Jessica U, DO      . pantoprazole (PROTONIX) EC tablet 40 mg  40 mg Oral Daily Eulogio Bear U, DO   40 mg at 09/02/17 2014  . simvastatin (ZOCOR) tablet 40  mg  40 mg Oral QPM Vann, Jessica U, DO   40 mg at 09/02/17 2014  . vancomycin (VANCOCIN) IVPB 750 mg/150 ml premix  750 mg Intravenous Q24H Maricela Bo, Erin R, RPH      . zolpidem (AMBIEN) tablet 5 mg  5 mg Oral QHS PRN Eulogio Bear U, DO        REVIEW OF SYSTEMS:  [X]  denotes positive finding, [ ]  denotes negative finding Cardiac  Comments:  Chest pain or chest pressure:    Shortness of breath upon exertion:    Short of breath when lying flat:    Irregular heart rhythm:        Vascular    Pain in calf, thigh, or hip brought on by ambulation:    Pain in feet at night that wakes you up from your sleep:  X   Blood clot in your veins:    Leg swelling:         Pulmonary    Oxygen at home:    Productive cough:     Wheezing:         Neurologic    Sudden weakness in arms or legs:     Sudden numbness in arms or legs:     Sudden onset of difficulty speaking or slurred speech:    Temporary loss of vision in one eye:     Problems with dizziness:         Gastrointestinal    Blood in stool:     Vomited blood:         Genitourinary    Burning when urinating:     Blood in urine:        Psychiatric X  bipolar disorder  Major depression:         Hematologic    Bleeding problems:    Problems with blood clotting too easily:        Skin    Rashes or ulcers: X Left foot      Constitutional    Fever or chills: X    PHYSICAL EXAM:   Vitals:   09/02/17 1700 09/02/17 1748 09/02/17 1800 09/02/17 1952  BP: 120/75 132/86 128/80 (!) 156/90  Pulse: 87 88 89 91  Resp:  14  18  Temp:  98.2 F (36.8 C)  98.5 F (36.9 C)  TempSrc:  Oral  Oral  SpO2:  100%  100%  Weight:    140 lb (63.5 kg)  Height:    5\' 9"  (1.753 m)    GENERAL: The patient is a well-nourished female, in no acute distress. The vital signs are documented above. CARDIAC: There is a regular rate and rhythm.  VASCULAR: There are no carotid  bruits. On the left side, which is the symptomatic side, she has a palpable femoral pulse.  I cannot palpate a popliteal or pedal pulses.  She has barely biphasic Doppler signals in the dorsalis pedis and posterior tibial positions on the left. On the right side she has a palpable femoral, popliteal, dorsalis pedis, and posterior tibial pulse. She has no significant lower extremity swelling. PULMONARY: There is good air exchange bilaterally without wheezing or rales. PSYCHIATRIC: The patient has a normal affect. SKIN: The patient has a wound over the lateral aspect of her foot where the amputation site was.  There is an odor.  There is mild cellulitis. EXTREMITY: The left great toe has some ischemic changes.  DATA:    ARTERIOGRAM: I reviewed her arteriogram that was done on  02/03/2017.  On the left side she had no inflow disease.  There was disease of the popliteal artery that was successfully addressed with atherectomy and drug-coated balloon angioplasty.  In addition she had angioplasty of the left anterior tibial artery.  She had three-vessel runoff to the ankle on the left.  On the right side there was a high takeoff of the posterior tibial artery with a 70% stenosis at the ostium.  There was some non-flow-limiting stenoses in the superficial femoral artery and popliteal artery.  ARTERIAL DOPPLER STUDY: I reviewed her arterial Doppler study that was done on 08/11/2017.  This showed a triphasic anterior tibial signal on the left and a triphasic posterior tibial signal.  ABI was 94%.  There were also triphasic signals in the right foot and the anterior tibial artery and posterior tibial artery positions with an ABI of 100%.  ARTERIAL DUPLEX: Arterial duplex on 08/11/2017 showed some stenosis in the proximal popliteal artery in the range of 50-74%.  However there were normal Doppler signals below this.  LABS: GFR is greater than 60.  Creatinine is 1.26.   X-RAY LEFT FOOT: X-ray of the left foot on  09/02/2017 showed no evidence of osteomyelitis.  MEDICAL ISSUES:   NONHEALING WOUND LEFT FOOT WITH HISTORY OF PERIPHERAL VASCULAR DISEASE: This patient has developed a new wound on the lateral aspect of her left foot where she had the amputation of her toes.  She also has some ischemic changes to her left great toe.  I suspect that she will need a transmetatarsal amputation once the infection has cleared. The patient is currently on Maxipime, Flagyl, and vancomycin.   Given the ischemic changes to the great toe and the new wound on the foot, I think that she will require arteriography which I have scheduled for Monday.  She could have potentially had embolic disease to her foot.  I have discussed the indications for the procedure and the potential complications and she is agreeable to proceed.  I have written to soak the foot daily and soap soaks and continue dry dressing changes.  Deitra Mayo [facility] [phone]

## 2017-09-02 NOTE — H&P (Signed)
History and Physical    MACKLYN GLANDON LOV:[id number] DOB: [birth year] DOA: 09/02/2017  Referring MD/NP/PA:  PCP: Rubbie Battiest, NP Outpatient Specialists:  Patient coming from: home  Chief Complaint: pain in foot  HPI: Kathy Phelps is a 36 y.o. female with medical history significant of poorly controlled DM, HTN, HLD, bipolar, medical non-compliance.  She in April had a left popliteal and tibial intervention by Dr. Donzetta Matters.  After re-vascularization, she had 3 toes amputated which eventually healed.  In early November, she was hospitalized with left leg pain and toe pain.  During that admission, she had ABIs and duplexes.  ABI shows normal right and mildly abnormal left leg.  She continues to smoke and her DM continues to be uncontrolled.  She came and left AMA from the ER before she was seen on 11/16 and 11/20-- her complaints for those visits were her wound opened and was bleeding and she developed purple discoloration on her left big toe.  Today, she returned to the ER for foul smelling drainage from the open wound and 10/10 burning pain in her foot.  She denies trauma and had been putting "an ointment" on the wound at home She reports a subjective fever of 100.7 yesterday.  No nausea and no vomiting.  She is unable to get pain medications as her PCP fired her-- last opioid was at the most recent discharge for 5 days.    In the ER, x ray did not show osteomyelitis.  Her sed rate was elevated and mild leukocytosis.  She was given vanc/rocephin/flagyl in the ER.  Er PA spoke with Dr. Scot Dock who recommends transfer to [facility] and most likely will need angiogram.     Review of Systems: all systems reviewed, negative unless stated above in HPI   Past Medical History:  Diagnosis Date  . Bipolar affective ([place])   . Complete miscarriage   . Depression   . Diabetes mellitus   . Hyperlipidemia   . Hypertension   . Pregnancy complication    HELP Syndrome  . Renal disorder   .  Schizophrenia [facility])     Past Surgical History:  Procedure Laterality Date  . ABDOMINAL AORTOGRAM W/LOWER EXTREMITY N/A 02/03/2017   Procedure: Abdominal Aortogram w/Lower Extremity;  Surgeon: Waynetta Sandy, MD;  Location: [place] [facility];  Service: Cardiovascular;  Laterality: N/A;  . AMPUTATION Left 02/10/2017   Procedure: AMPUTATION TOES 3, 4 AND 5  LEFT FOOT;  Surgeon: Waynetta Sandy, MD;  Location: [place];  Service: Vascular;  Laterality: Left;  . CESAREAN SECTION    . CHOLECYSTECTOMY    . PERIPHERAL VASCULAR ATHERECTOMY  02/03/2017   Procedure: Peripheral Vascular Atherectomy;  Surgeon: Waynetta Sandy, MD;  Location: [place] [facility];  Service: Cardiovascular;;  . PERIPHERAL VASCULAR BALLOON ANGIOPLASTY  02/03/2017   Procedure: Peripheral Vascular Balloon Angioplasty;  Surgeon: Waynetta Sandy, MD;  Location: [place] [facility];  Service: Cardiovascular;;  . TUBAL LIGATION       reports that she has been smoking cigarettes.  She has a 28.00 pack-year smoking history. she has never used smokeless tobacco. She reports that she does not drink alcohol or use drugs.  Allergies  Allergen Reactions  . Hydrocodone-Acetaminophen Hives  . Tylenol [Acetaminophen] Itching and Swelling    SWELLING REACTION UNSPECIFIED     Family History  Problem Relation Age of Onset  . Diabetes Mellitus II Mother   . Heart disease Mother   . Heart disease  Father      Prior to Admission medications   Medication Sig Start Date End Date Taking? Authorizing Provider  ADVAIR DISKUS 100-50 MCG/DOSE AEPB Inhale 1 puff into the lungs daily. 01/10/15   [provider]  alprazolam Duanne Moron) 2 MG tablet Take 1 tablet (2 mg total) by mouth 4 (four) times daily as needed for sleep. 08/12/17   Debbe Odea, MD  clopidogrel (PLAVIX) 75 MG tablet Take 1 tablet (75 mg total) by mouth daily with breakfast. 02/12/17   Hosie Poisson, MD  famotidine (PEPCID) 20 MG tablet Take 1  tablet (20 mg total) by mouth 2 (two) times daily. 08/12/17   Debbe Odea, MD  gabapentin (NEURONTIN) 800 MG tablet Take 1 tablet (800 mg total) by mouth 2 (two) times daily. Patient taking differently: Take 800 mg by mouth 5 (five) times daily.  02/11/17   Hosie Poisson, MD  insulin glargine (LANTUS) 100 UNIT/ML injection Inject 0.2-0.3 mLs (20-30 Units total) into the skin 2 (two) times daily. 20 U in the morning and 30 U in the evening 08/12/17   Caren Griffins, MD  Insulin Syringe-Needle U-100 (INSULIN SYRINGE .5CC/31GX5/16") 31G X 5/16" 0.5 ML MISC 1 each by Does not apply route QID. 08/12/17   Gherghe, Costin M, MD  LATUDA 120 MG TABS Take 120 mg by mouth at bedtime. 04/12/16   [provider]  metFORMIN (GLUCOPHAGE) 1000 MG tablet Take 1 tablet (1,000 mg total) by mouth 2 (two) times daily. Patient taking differently: Take 1,500 mg by mouth 2 (two) times daily.  07/16/14   Pisciotta, Elmyra Ricks, PA-C  metoprolol tartrate (LOPRESSOR) 25 MG tablet Take 0.5 tablets (12.5 mg total) by mouth 2 (two) times daily. 02/11/17   Hosie Poisson, MD  nicotine (NICODERM CQ - DOSED IN MG/24 HOURS) 21 mg/24hr patch Place 1 patch (21 mg total) onto the skin daily. Patient not taking: Reported on 08/10/2017 02/12/17   Hosie Poisson, MD  NOVOLOG 100 UNIT/ML injection Inject 0-30 Units into the skin 3 (three) times daily with meals. Sliding Scale:  >300 15 units < 300 do not use 08/12/17   Gherghe, Vella Redhead, MD  oxcarbazepine (TRILEPTAL) 600 MG tablet Take 600 mg by mouth 2 (two) times daily. 04/12/16   [provider]  pantoprazole (PROTONIX) 40 MG tablet Take 1 tablet (40 mg total) by mouth daily. 02/11/17   Hosie Poisson, MD  PROAIR HFA 108 (90 BASE) MCG/ACT inhaler Inhale 2 puffs into the lungs every 6 (six) hours as needed for shortness of breath.  01/10/15   [provider]  simvastatin (ZOCOR) 40 MG tablet Take 40 mg by mouth every evening.  04/25/16   [provider]  tamsulosin (FLOMAX)  0.4 MG CAPS capsule Take 0.4 mg by mouth daily. 04/12/16   [provider]  zolpidem (AMBIEN) 10 MG tablet Take 10 mg by mouth at bedtime as needed for sleep. 04/26/16   [provider]    Physical Exam: Vitals:   09/02/17 1120 09/02/17 1340 09/02/17 1549  BP: 140/90 (!) 138/101 (!) 163/119  Pulse: 82 92 88  Resp: 18 14 14   Temp: 98.2 F (36.8 C) 98.3 F (36.8 C)   TempSrc: Oral Oral   SpO2: 100% 100% 100%  Weight:  63.5 kg (140 lb)   Height:  5\' 9"  (1.753 m)       Constitutional: older than stated age, asking about pain medications Vitals:   09/02/17 1120 09/02/17 1340 09/02/17 1549  BP: 140/90 (!) 138/101 Marland Kitchen)  163/119  Pulse: 82 92 88  Resp: 18 14 14   Temp: 98.2 F (36.8 C) 98.3 F (36.8 C)   TempSrc: Oral Oral   SpO2: 100% 100% 100%  Weight:  63.5 kg (140 lb)   Height:  5\' 9"  (1.753 m)    Eyes: PERRL, lids and conjunctivae normal ENMT: Mucous membranes are moist. Posterior pharynx clear of any exudate or lesions. Poor dentation Neck: normal, supple, no masses, no thyromegaly Respiratory: clear to auscultation bilaterally, no wheezing, no crackles. Normal respiratory effort. No accessory muscle use.  Cardiovascular: Regular rate and rhythm, no murmurs / rubs / gallops. No extremity edema. 2+ pedal pulses. No carotid bruits.  Abdomen: no tenderness, no masses palpated. No hepatosplenomegaly. Bowel sounds positive.  Musculoskeletal: no clubbing / cyanosis. No joint deformity upper and lower extremities. Good ROM, no contractures. Normal muscle tone.  Ext: dusky appearance at tip of left great toe, foul smelling purulent discharge from 3-5 metatarsals with black eschar , pulse found with doppler Psychiatric: Normal judgment and insight. Alert and oriented x 3. Normal mood.     Labs on Admission: I have personally reviewed following labs and imaging studies  CBC: Recent Labs  Lab 08/26/17 1857 08/29/17 2327 09/02/17 1212  WBC 12.8* 12.1* 12.9*    NEUTROABS  --  6.5 8.8*  HGB 10.8* 10.6* 11.5*  HCT 31.8* 31.6* 33.1*  MCV 90.9 89.3 88.3  PLT 262 275 098   Basic Metabolic Panel: Recent Labs  Lab 08/26/17 1857 08/29/17 2327 09/02/17 1212  NA 135 135 135  K 3.7 4.2 4.4  CL 101 103 105  CO2 26 25 23   GLUCOSE 271* 194* 293*  BUN 15 15 20   CREATININE 1.55* 1.47* 1.37*  CALCIUM 8.5* 8.7* 8.8*   GFR: Estimated Creatinine Clearance: 55.8 mL/min (A) (by C-G formula based on SCr of 1.37 mg/dL (H)). Liver Function Tests: Recent Labs  Lab 08/29/17 2327  AST 14*  ALT 5*  ALKPHOS 63  BILITOT 0.2*  PROT 6.5  ALBUMIN 2.7*   No results for input(s): LIPASE, AMYLASE in the last 168 hours. No results for input(s): AMMONIA in the last 168 hours. Coagulation Profile: No results for input(s): INR, PROTIME in the last 168 hours. Cardiac Enzymes: No results for input(s): CKTOTAL, CKMB, CKMBINDEX, TROPONINI in the last 168 hours. BNP (last 3 results) No results for input(s): PROBNP in the last 8760 hours. HbA1C: No results for input(s): HGBA1C in the last 72 hours. CBG: Recent Labs  Lab 08/26/17 1906  GLUCAP 255*   Lipid Profile: No results for input(s): CHOL, HDL, LDLCALC, TRIG, CHOLHDL, LDLDIRECT in the last 72 hours. Thyroid Function Tests: No results for input(s): TSH, T4TOTAL, FREET4, T3FREE, THYROIDAB in the last 72 hours. Anemia Panel: No results for input(s): VITAMINB12, FOLATE, FERRITIN, TIBC, IRON, RETICCTPCT in the last 72 hours. Urine analysis:    Component Value Date/Time   COLORURINE AMBER (A) 08/26/2017 1948   APPEARANCEUR CLOUDY (A) 08/26/2017 1948   LABSPEC 1.025 08/26/2017 1948   PHURINE 5.0 08/26/2017 1948   GLUCOSEU >=500 (A) 08/26/2017 1948   HGBUR SMALL (A) 08/26/2017 1948   BILIRUBINUR SMALL (A) 08/26/2017 1948   KETONESUR NEGATIVE 08/26/2017 1948   PROTEINUR 100 (A) 08/26/2017 1948   UROBILINOGEN 1.0 02/26/2015 1152   NITRITE NEGATIVE 08/26/2017 1948   LEUKOCYTESUR TRACE (A) 08/26/2017 1948    Sepsis Labs: Invalid input(s): PROCALCITONIN, LACTICIDVEN No results found for this or any previous visit (from the past 240 hour(s)).   Radiological Exams  on Admission: Dg Foot Complete Left  Result Date: 09/02/2017 CLINICAL DATA:  Left foot pain after amputation. EXAM: LEFT FOOT - COMPLETE 3+ VIEW COMPARISON:  Radiographs of February 01, 2017. 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 No acute fracture or dislocation is noted. No lytic destruction is seen to suggest osteomyelitis. No soft tissue abnormality is noted. IMPRESSION: Status post surgical amputation as described above. No lytic destruction is seen to suggest osteomyelitis. Electronically Signed   By: Marijo Conception, M.D.   On: 09/02/2017 13:15      Assessment/Plan Active Problems:   Type II diabetes mellitus with renal manifestations, uncontrolled (HCC)   Essential hypertension   Great toe pain, left   Surgical wound, non healing   Bipolar 1 disorder ([place])   Non-healing surgical site infection -IV abx per pharmacy -poorly controlled DM and tobacco abuse contributing -vascular to see at [facility]-- may need angiogram -pain control -seen by [facility] nurse: Apply mupirocin ointment.  This will soften devitalized tissue.  Has been using Santyl at home with no improvement.  Cover with dry dressing and tape.  Change daily.  CKD Estimated Creatinine Clearance: 55.8 mL/min (A) (by C-G formula based on SCr of 1.37 mg/dL (H)).  -monitor with daily labs due to IV abx and possible angiogram (will need to hold metformin)  Uncontrolled DM -continue home meds (including metformin for now) -SSI -adjust for optimal control  Neuropathy -Neurontin listed as patient taking 800 mg 5x/day -await pharmacy verification     DVT prophylaxis: lovenox Code Status: *full Family Communication: at bedside Disposition Plan: tx to [place] called: Dr.  Scot Dock-- please notify him when patient arrives Admission status: med surge inpt   Geradine Girt DO [facility] Pager [phone]  If 7PM-7AM, please contact night-coverage [email or web address] Password [email or web address]  09/02/2017, 4:07 PM

## 2017-09-02 NOTE — Consult Note (Signed)
[facility] Nurse wound consult note Reason for Consult:Nonhealing surgical site to left foot.  Third fourth and fifth metatarsal amputation.  Scabbed, draining, erythema and induration noted to suture line.  Awaiting vascular consult Left great toe is cool and discolored at this time as well.  Wound type:Nonhealing surgical site with infection Pressure Injury POA: NA Measurement: 0.4 cm x 3 cm suture line with scabbing, drainage and purulence noted Wound bed: scabbing, purulence Drainage (amount, consistency, odor) green purulence  Tender to touch Periwound: erythema and pain  induration Dressing procedure/placement/frequency: Cleanse left foot surgical site with NS.  Apply mupirocin ointment.  This will soften devitalized tissue.  Has been using Santyl at home with no improvement.  Cover with dry dressing and tape.  Change daily. Will not follow at this time.  Please re-consult if needed.  Domenic Moras RN BSN [place] Pager [phone]

## 2017-09-02 NOTE — Progress Notes (Signed)
Pharmacy Antibiotic Note  Kathy Phelps is a 36 y.o. female admitted on 09/02/2017 with a non-healing surgical site infection.  Pharmacy has been consulted for cefepime and vancomycin dosing, also receiving flagyl.  Plan:  Flagyl 500mg  IV q8h per MD  Cefepime 2g IV q8h   Vancomycin 1g IV given a 13:45 in ED, continue with 750mg  q24h (CrCl 38 ml/min/1.67m2)  Check vancomycin trough at steady state (goal 10-15 mcg/ml)  Follow up renal function & cultures  Height: 5\' 9"  (175.3 cm) Weight: 140 lb (63.5 kg) IBW/kg (Calculated) : 66.2  Temp (24hrs), Avg:98.3 F (36.8 C), Min:98.2 F (36.8 C), Max:98.3 F (36.8 C)  Recent Labs  Lab 08/26/17 1857 08/29/17 2327 08/29/17 2344 09/02/17 1212 09/02/17 1221  WBC 12.8* 12.1*  --  12.9*  --   CREATININE 1.55* 1.47*  --  1.37*  --   LATICACIDVEN  --   --  1.70  --  0.80    Estimated Creatinine Clearance: 55.8 mL/min (A) (by C-G formula based on SCr of 1.37 mg/dL (H)).    Allergies  Allergen Reactions  . Hydrocodone-Acetaminophen Hives  . Tylenol [Acetaminophen] Itching and Swelling    SWELLING REACTION UNSPECIFIED     Antimicrobials this admission: 11/23 Rocephin x 1 11/23 Cefepime >> 11/23 Flagyl >> 11/23 Vancomycin >>  Dose adjustments this admission:   Microbiology results: 11/23 BCx:   Thank you for allowing pharmacy to be a part of this patient's care.  Peggyann Juba, PharmD, BCPS Pager: [phone] 09/02/2017 4:18 PM

## 2017-09-02 NOTE — ED Triage Notes (Signed)
Pt states that she had an amputation of 3 toes (pinky, ring, middle) 8 months ago on the left foot  and now presents with brown, black  Drainage the is foul smelling and painful  10/10 burning pain and feels like it has radiated to the bone. Pt also states that left big toe has become bluish/black in color. Pt reports she did not take any medication for pain today. Pt is a diabetic. Pt is in a W/C and accompanied with dtr

## 2017-09-02 NOTE — ED Notes (Signed)
ED TO INPATIENT HANDOFF REPORT  Name/Age/Gender Kathy Phelps 36 y.o. female  Code Status Code Status History    Date Active Date Inactive Code Status Order ID Comments User Context   08/11/2017 00:31 08/12/2017 22:23 Full Code [id number]  Kathy Costa, MD ED   02/02/2017 04:47 02/11/2017 22:02 Full Code [id number]  Kathy Patience, MD Inpatient      Home/SNF/Other Home  Chief Complaint Left Foot Infection  Level of Care/Admitting Diagnosis ED Disposition    ED Disposition Condition [name] Area: [place] [100100]  Level of Care: Med-Surg [16]  Diagnosis: Surgical wound, non healing [841324]  Admitting Physician: Kathy Phelps  Attending Physician: Kathy Phelps [[id number]]  Estimated length of stay: past midnight tomorrow  Certification:: I certify this patient will need inpatient services for at least 2 midnights  PT Class (Do Not Modify): Inpatient [101]  PT Acc Code (Do Not Modify): Private [1]       Medical History Past Medical History:  Diagnosis Date  . Bipolar affective ([place])   . Complete miscarriage   . Depression   . Diabetes mellitus   . Hyperlipidemia   . Hypertension   . Pregnancy complication    HELP Syndrome  . Renal disorder   . Schizophrenia ([place])     Allergies Allergies  Allergen Reactions  . Hydrocodone-Acetaminophen Hives  . Tylenol [Acetaminophen] Itching and Swelling    SWELLING REACTION UNSPECIFIED     IV Location/Drains/Wounds Patient Lines/Drains/Airways Status   Active Line/Drains/Airways    Name:   Placement date:   Placement time:   Site:   Days:   Peripheral IV 09/02/17 Left Antecubital   09/02/17    1219    Antecubital   less than 1   Sheath 02/03/17 Right Arterial;Femoral   02/03/17    0857    Arterial;Femoral   211   Negative Pressure Wound Therapy Foot Left   02/10/17    0811    -   204   Incision (Closed) 02/10/17 Foot Left   02/10/17    0801     204   Wound / Incision (Open  or Dehisced) 02/02/17 Diabetic ulcer Toe (Comment  which one) Left necrotic   02/02/17    0215    Toe (Comment  which one)   212          Labs/Imaging Results for orders placed or performed during the hospital encounter of 09/02/17 (from the past 48 hour(s))  CBC with Differential     Status: Abnormal   Collection Time: 09/02/17 12:12 PM  Result Value Ref Range   WBC 12.9 (H) 4.0 - 10.5 K/uL   RBC 3.75 (L) 3.87 - 5.11 MIL/uL   Hemoglobin 11.5 (L) 12.0 - 15.0 g/dL   HCT 33.1 (L) 36.0 - 46.0 %   MCV 88.3 78.0 - 100.0 fL   MCH 30.7 26.0 - 34.0 pg   MCHC 34.7 30.0 - 36.0 g/dL   RDW 12.3 11.5 - 15.5 %   Platelets 263 150 - 400 K/uL   Neutrophils Relative % 68 %   Neutro Abs 8.8 (H) 1.7 - 7.7 K/uL   Lymphocytes Relative 20 %   Lymphs Abs 2.6 0.7 - 4.0 K/uL   Monocytes Relative 7 %   Monocytes Absolute 0.9 0.1 - 1.0 K/uL   Eosinophils Relative 5 %   Eosinophils Absolute 0.6 0.0 - 0.7 K/uL   Basophils Relative 0 %  Basophils Absolute 0.0 0.0 - 0.1 K/uL  Basic metabolic panel     Status: Abnormal   Collection Time: 09/02/17 12:12 PM  Result Value Ref Range   Sodium 135 135 - 145 mmol/L   Potassium 4.4 3.5 - 5.1 mmol/L   Chloride 105 101 - 111 mmol/L   CO2 23 22 - 32 mmol/L   Glucose, Bld 293 (H) 65 - 99 mg/dL   BUN 20 6 - 20 mg/dL   Creatinine, Ser 1.37 (H) 0.44 - 1.00 mg/dL   Calcium 8.8 (L) 8.9 - 10.3 mg/dL   GFR calc non Af Amer 48 (L) >60 mL/min   GFR calc Af Amer 56 (L) >60 mL/min    Comment: (NOTE) The eGFR has been calculated using the CKD EPI equation. This calculation has not been validated in all clinical situations. eGFR's persistently <60 mL/min signify possible Chronic Kidney Disease.    Anion gap 7 5 - 15  Sedimentation rate     Status: Abnormal   Collection Time: 09/02/17 12:12 PM  Result Value Ref Range   Sed Rate 72 (H) 0 - 22 mm/hr  I-Stat CG4 Lactic Acid, ED     Status: None   Collection Time: 09/02/17 12:21 PM  Result Value Ref Range   Lactic Acid,  Venous 0.80 0.5 - 1.9 mmol/L   Dg Foot Complete Left  Result Date: 09/02/2017 CLINICAL DATA:  Left foot pain after amputation. EXAM: LEFT FOOT - COMPLETE 3+ VIEW COMPARISON:  Radiographs of February 01, 2017. 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 No acute fracture or dislocation is noted. No lytic destruction is seen to suggest osteomyelitis. No soft tissue abnormality is noted. IMPRESSION: Status post surgical amputation as described above. No lytic destruction is seen to suggest osteomyelitis. Electronically Signed   By: Marijo Conception, M.D.   On: 09/02/2017 13:15    Pending Labs Unresulted Labs (From admission, onward)   Start     Ordered   09/02/17 1221  Blood culture (routine x 2)  BLOOD CULTURE X 2,   STAT     09/02/17 1221   09/02/17 1157  C-reactive protein  Once,   R     09/02/17 1156   Signed and Held  Prealbumin  Once,   R     Signed and Held   Signed and Held  Creatinine, serum  (enoxaparin (LOVENOX)    CrCl >/= 30 ml/min)  Weekly,   R    Comments:  while on enoxaparin therapy    Signed and Held   Signed and Held  Basic metabolic panel  Tomorrow morning,   R     Signed and Held   Signed and Held  CBC  Tomorrow morning,   R     Signed and Held      Vitals/Pain Today's Vitals   09/02/17 1341 09/02/17 1350 09/02/17 1456 09/02/17 1549  BP:    (!) 163/119  Pulse:    88  Resp:    14  Temp:      TempSrc:      SpO2:    100%  Weight:      Height:      PainSc: '9  9  9  9     ' Isolation Precautions No active isolations  Medications Medications  cefTRIAXone (ROCEPHIN) 2 g in dextrose 5 % 50 mL IVPB (0 g Intravenous Stopped 09/02/17 1339)  metroNIDAZOLE (FLAGYL)  tablet 500 mg (500 mg Oral Given 09/02/17 1348)  mupirocin ointment (BACTROBAN) 2 % (not administered)  HYDROmorphone (DILAUDID) injection 1 mg (1 mg Intravenous Given 09/02/17 1553)  vancomycin (VANCOCIN) IVPB 1000  mg/200 mL premix (0 mg Intravenous Stopped 09/02/17 1455)  HYDROmorphone (DILAUDID) injection 0.5 mg (0.5 mg Intravenous Given 09/02/17 1247)  HYDROmorphone (DILAUDID) injection 0.5 mg (0.5 mg Intravenous Given 09/02/17 1343)    Mobility walks with device-wheelchair.

## 2017-09-02 NOTE — ED Notes (Signed)
Signature pad not working, pt has signed consent to be transported to [facility]

## 2017-09-02 NOTE — ED Notes (Signed)
[facility] called for transport. 

## 2017-09-02 NOTE — ED Notes (Addendum)
Attempted to give report, RN ambulating patient, gave call back #.
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
## 2017-09-02 NOTE — ED Notes (Signed)
Bed: WTR6 Expected date:  Expected time:  Means of arrival:  Comments: 

## 2017-09-02 NOTE — ED Notes (Signed)
Assigned 15:48 rm [id number], approved [id number], 15:53

## 2017-09-02 NOTE — ED Notes (Signed)
Delay in transport to [place], pt's daughter is 60 and there is no one else to transport daughter.

## 2017-09-02 NOTE — Progress Notes (Signed)
A consult was received from an ED physician for vancomycin per pharmacy dosing.  The patient's profile has been reviewed for ht/wt/allergies/indication/available labs.   A one time order has been placed for vancomycin 1g.  Further antibiotics/pharmacy consults should be ordered by admitting physician if indicated.                       Thank you, Kara Mead 09/02/2017  12:28 PM

## 2017-09-03 LAB — CBC
HCT: 29.4 % — ABNORMAL LOW (ref 36.0–46.0)
Hemoglobin: 10.1 g/dL — ABNORMAL LOW (ref 12.0–15.0)
MCH: 30.2 pg (ref 26.0–34.0)
MCHC: 34.4 g/dL (ref 30.0–36.0)
MCV: 88 fL (ref 78.0–100.0)
PLATELETS: 227 10*3/uL (ref 150–400)
RBC: 3.34 MIL/uL — AB (ref 3.87–5.11)
RDW: 12.2 % (ref 11.5–15.5)
WBC: 11 10*3/uL — AB (ref 4.0–10.5)

## 2017-09-03 LAB — BASIC METABOLIC PANEL
Anion gap: 5 (ref 5–15)
BUN: 17 mg/dL (ref 6–20)
CALCIUM: 8.3 mg/dL — AB (ref 8.9–10.3)
CO2: 25 mmol/L (ref 22–32)
CREATININE: 1.26 mg/dL — AB (ref 0.44–1.00)
Chloride: 105 mmol/L (ref 101–111)
GFR, EST NON AFRICAN AMERICAN: 53 mL/min — AB (ref >60)
Glucose, Bld: 126 mg/dL — ABNORMAL HIGH (ref 65–99)
Potassium: 4 mmol/L (ref 3.5–5.1)
SODIUM: 135 mmol/L (ref 135–145)

## 2017-09-03 LAB — GLUCOSE, CAPILLARY
Glucose-Capillary: 104 mg/dL — ABNORMAL HIGH (ref 65–99)
Glucose-Capillary: 114 mg/dL — ABNORMAL HIGH (ref 65–99)
Glucose-Capillary: 218 mg/dL — ABNORMAL HIGH (ref 65–99)

## 2017-09-03 MED ORDER — OXYCODONE-ACETAMINOPHEN 5-325 MG PO TABS
1.0000 | ORAL_TABLET | ORAL | Status: DC | PRN
  Administered 2017-09-03 – 2017-09-08 (×14): 1 via ORAL
  Filled 2017-09-03 (×14): qty 1

## 2017-09-03 MED ORDER — PROMETHAZINE HCL 25 MG/ML IJ SOLN
12.5000 mg | Freq: Four times a day (QID) | INTRAMUSCULAR | Status: DC | PRN
  Administered 2017-09-03 – 2017-09-06 (×5): 12.5 mg via INTRAVENOUS
  Filled 2017-09-03 (×5): qty 1

## 2017-09-03 MED ORDER — VANCOMYCIN HCL 10 G IV SOLR
1250.0000 mg | INTRAVENOUS | Status: DC
  Administered 2017-09-03 – 2017-09-07 (×5): 1250 mg via INTRAVENOUS
  Filled 2017-09-03 (×8): qty 1250

## 2017-09-03 MED ORDER — DEXTROSE-NACL 5-0.9 % IV SOLN
INTRAVENOUS | Status: DC
  Administered 2017-09-03 – 2017-09-04 (×4): via INTRAVENOUS

## 2017-09-03 MED ORDER — CALCIUM GLUCONATE 10 % IV SOLN
2.0000 g | Freq: Once | INTRAVENOUS | Status: AC
  Administered 2017-09-03: 2 g via INTRAVENOUS
  Filled 2017-09-03: qty 20

## 2017-09-03 NOTE — Progress Notes (Signed)
Pharmacy Antibiotic Note  Kathy Phelps is a 36 y.o. female admitted on 09/02/2017 with a non-healing surgical site infection.  Pharmacy has been consulted for cefepime and vancomycin dosing, also receiving flagyl.  WBC down at 11. SCr is improving at 1.26 today.   Plan:  Flagyl 500mg  IV q8h per MD  Cefepime 2g IV q8h   Increase vancomycin to 1250mg  IV every 24 hours for improving renal function  Check vancomycin trough at steady state (goal 10-15 mcg/ml)  Follow up renal function & cultures  Height: 5\' 9"  (175.3 cm) Weight: 140 lb (63.5 kg) IBW/kg (Calculated) : 66.2  Temp (24hrs), Avg:98.3 F (36.8 C), Min:98.2 F (36.8 C), Max:98.5 F (36.9 C)  Recent Labs  Lab 08/29/17 2327 08/29/17 2344 09/02/17 1212 09/02/17 1221 09/03/17 0523  WBC 12.1*  --  12.9*  --  11.0*  CREATININE 1.47*  --  1.37*  --  1.26*  LATICACIDVEN  --  1.70  --  0.80  --     Estimated Creatinine Clearance: 60.7 mL/min (A) (by C-G formula based on SCr of 1.26 mg/dL (H)).    Allergies  Allergen Reactions  . Hydrocodone-Acetaminophen Hives  . Tylenol [Acetaminophen] Itching and Swelling    SWELLING REACTION UNSPECIFIED     Antimicrobials this admission: 11/23 Rocephin x 1 11/23 Cefepime >> 11/23 Flagyl >> 11/23 Vancomycin >>  Dose adjustments this admission:   Microbiology results: 11/23 BCx:   Thank you for allowing pharmacy to be a part of this patient's care.  Sloan Leiter, PharmD, BCPS, BCCCP Clinical Pharmacist Clinical phone 09/03/2017 until 3:[phone] After hours, please call #[phone] 09/03/2017 10:53 AM

## 2017-09-03 NOTE — Progress Notes (Addendum)
Initial Nutrition Assessment  INTERVENTION:   Magic cup BID betwee meals, each supplement provides 290 kcal and 9 grams of protein Mighty Shake II (chocolate) TID at HS snack, each supplement provides 480-500 kcals and 20-23 grams of protein Encouraged protein at each meal, reviewed protein sources with pt.  Discussed importance of blood sugar control with pt.  NUTRITION DIAGNOSIS:   Increased nutrient needs related to wound healing as evidenced by estimated needs.  GOAL:   Patient will meet greater than or equal to 90% of their needs  MONITOR:   PO intake, Supplement acceptance, Skin  REASON FOR ASSESSMENT:   Consult Wound healing  ASSESSMENT:   Pt with PMH of bipolar affective d/o, depression, poorly controlled DM, HTN, hyperlipidemia, schizophrenia, PVD s/p toe amputations, and continues to smoke admitted with nonhealing left foot wound. No osteomyelitis on xray 11/23 but is being treated for infection and may need transmetatarsal amputation.     Meal Completion: 100% per chart documentation Per pt she is only eating fruit. 24 hr recall: Breakfast: fruit, Lunch: does not eat, Dinner: fruit Family in room, concern if family eating meal tray.  She complains that kitchen is not sending her fruit.  Does not like Glucerna or ensure but is willing to try magic cup and Hormel shake.  Pt with prior hx of weight loss and meeting criteria for moderate malnutrition back in 02/2017. Weight is now up and stable. Her wound had healed after that last surgery. Per pt wound has been reopened for 5 days and is having a lot of pain. Noted muscle depletion on BLE, per pt she is less active as she is afraid it will get worse.  Spoke with pt's RN about nutrition plan.   Medications reviewed and include: colace, novolog, lantus, glucophage, flagyl Labs reviewed: prealbumin 15.3 (L) CBG's: [phone] Lab Results  Component Value Date   HGBA1C 11.8 (H) 02/02/2017      NUTRITION - FOCUSED  PHYSICAL EXAM:    Most Recent Value  Orbital Region  No depletion  Upper Arm Region  No depletion  Thoracic and Lumbar Region  No depletion  Buccal Region  No depletion  Temple Region  No depletion  Clavicle Bone Region  No depletion  Clavicle and Acromion Bone Region  No depletion  Scapular Bone Region  No depletion  Dorsal Hand  Mild depletion  Patellar Region  Mild depletion  Anterior Thigh Region  Moderate depletion  Posterior Calf Region  Moderate depletion  Edema (RD Assessment)  None  Hair  Reviewed  Eyes  Reviewed  Mouth  Reviewed [poor dentition]  Skin  Reviewed  Nails  Reviewed       Diet Order:  Diet Carb Modified Fluid consistency: Thin; Room service appropriate? Yes  EDUCATION NEEDS:   Education needs have been addressed  Skin:  Skin Assessment: Skin Integrity Issues: Skin Integrity Issues:: Diabetic Ulcer Diabetic Ulcer: L foot  Last BM:  unknown  Height:   Ht Readings from Last 1 Encounters:  09/02/17 5\' 9"  (1.753 m)    Weight:   Wt Readings from Last 1 Encounters:  09/02/17 140 lb (63.5 kg)    Ideal Body Weight:  65.9 kg  BMI:  Body mass index is 20.67 kg/m.  Estimated Nutritional Needs:   Kcal:  1800-2000  Protein:  90-105 grams  Fluid:  2 L/day  Maylon Peppers RD, LDN, CNSC [phone] Pager [phone] After Hours Pager

## 2017-09-03 NOTE — Progress Notes (Signed)
PROGRESS NOTE    Kathy Phelps  BSW:[id number] DOB: 1981-01-10 DOA: 09/02/2017 PCP: Rubbie Battiest, NP   Specialists:     Brief Narrative:  36 year old female Known history of hypertension hyperlipidemia reflux depression continue tobacco abuse peripheral vascular disease Atherectomy left popliteal artery drug-coated angios left popliteal plus angioplasty left anterior tibial artery 02/03/2017 secondary to gangrene left third, fourth, fifth toes 02/10/17 amputation placement of VAC of the same toes 08/11/17 left leg pain and toe pain Dopplers = good waveform-palpable dorsalis pedis on the left with ischemia--was sent home not needing antibiotics and told to follow-up Came back to the emergency room 11/16 and 11/20 with foot opening up bleeding and returned ultimately 09/02/2017 with foul-smelling drainage from open wound and burning in the foot with T-max 100.7  BUN/creatinine on admission 20/1.3 lactic acid 0.8 WBC 12.9 Foot x-ray showed no lytic lesions suggestive of osteomyelitis Vascular surgeon consulted as below  Assessment & Plan:   Active Problems:   Type II diabetes mellitus with renal manifestations, uncontrolled (HCC)   Essential hypertension   Great toe pain, left   Surgical wound, non healing   Bipolar 1 disorder (HCC)   PVD, Ulcer and non-healing wound with ischemia Appreciate Vascular input Continue Maxipime, Flagyl and Vanc Daily soaks in soap and dry dressing changes as per vascular surgery Areteriography as per Dr. Scot Dock 11/26--? Trans-met amputation as well? 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 Last a1c 11.8 on 02/02/17 Continue metformin 100 bid--would hold prior to angiography Cont lantus 20am , 30 pm  CKD ii Creat down from 1.47-->1.2 [baselein between 1.4 + 1.5] Hold IV saline for  now  Bipolar Cont Xanax 2 qid, Latuda 120 hs, ambien 5 daily--held trileptal 600 bid on admit  Gerd cont pepcid 20 bid and protonix 40 qd    Consultants:   vadcular  Procedures:    none  Antimicrobials:   Vanc, flagyl, caefepime    Subjective: Main focus for patient is pain control-states dilaudid is wearing off too quikcly She is ok taking percocet No sob no cp Doesn't like the food, want to eat fruit   Objective: Vitals:   09/02/17 1700 09/02/17 1748 09/02/17 1800 09/02/17 1952  BP: 120/75 132/86 128/80 (!) 156/90  Pulse: 87 88 89 91  Resp:  14  18  Temp:  98.2 F (36.8 C)  98.5 F (36.9 C)  TempSrc:  Oral  Oral  SpO2:  100%  100%  Weight:    63.5 kg (140 lb)  Height:    '5\' 9"'  (1.753 m)    Intake/Output Summary (Last 24 hours) at 09/03/2017 0755 Last data filed at 09/03/2017 0300 Gross per 24 hour  Intake 300 ml  Output -  Net 300 ml   Filed Weights   09/02/17 1340 09/02/17 1952  Weight: 63.5 kg (140 lb) 63.5 kg (140 lb)    Examination:  dishevelled in nad eomi ncat s1 s2 no m/r/g/ abd soft nt nd  wound not examined today Neuro intact   Data Reviewed: I have personally reviewed following labs and imaging studies  CBC: Recent Labs  Lab 08/29/17 2327 09/02/17 1212 09/03/17 0523  WBC 12.1* 12.9* 11.0*  NEUTROABS 6.5 8.8*  --   HGB 10.6* 11.5* 10.1*  HCT 31.6* 33.1* 29.4*  MCV 89.3 88.3 88.0  PLT 275 263 227  Basic Metabolic Panel: Recent Labs  Lab 08/29/17 2327 09/02/17 1212 09/03/17 0523  NA 135 135 135  K 4.2 4.4 4.0  CL 103 105 105  CO2 '25 23 25  ' GLUCOSE 194* 293* 126*  BUN '15 20 17  ' CREATININE 1.47* 1.37* 1.26*  CALCIUM 8.7* 8.8* 8.3*   GFR: Estimated Creatinine Clearance: 60.7 mL/min (A) (by C-G formula based on SCr of 1.26 mg/dL (H)). Liver Function Tests: Recent Labs  Lab 08/29/17 2327  AST 14*  ALT 5*  ALKPHOS 63  BILITOT 0.2*  PROT 6.5  ALBUMIN 2.7*   No results for input(s): LIPASE, AMYLASE in the  last 168 hours. No results for input(s): AMMONIA in the last 168 hours. Coagulation Profile: No results for input(s): INR, PROTIME in the last 168 hours. Cardiac Enzymes: No results for input(s): CKTOTAL, CKMB, CKMBINDEX, TROPONINI in the last 168 hours. BNP (last 3 results) No results for input(s): PROBNP in the last 8760 hours. HbA1C: No results for input(s): HGBA1C in the last 72 hours. CBG: Recent Labs  Lab 09/02/17 1949 09/02/17 2103 09/03/17 0747  GLUCAP 241* 217* 104*   Lipid Profile: No results for input(s): CHOL, HDL, LDLCALC, TRIG, CHOLHDL, LDLDIRECT in the last 72 hours. Thyroid Function Tests: No results for input(s): TSH, T4TOTAL, FREET4, T3FREE, THYROIDAB in the last 72 hours. Anemia Panel: No results for input(s): VITAMINB12, FOLATE, FERRITIN, TIBC, IRON, RETICCTPCT in the last 72 hours. Urine analysis:    Component Value Date/Time   COLORURINE AMBER (A) 08/26/2017 1948   APPEARANCEUR CLOUDY (A) 08/26/2017 1948   LABSPEC 1.025 08/26/2017 1948   PHURINE 5.0 08/26/2017 1948   GLUCOSEU >=500 (A) 08/26/2017 1948   HGBUR SMALL (A) 08/26/2017 1948   BILIRUBINUR SMALL (A) 08/26/2017 1948   KETONESUR NEGATIVE 08/26/2017 1948   PROTEINUR 100 (A) 08/26/2017 1948   UROBILINOGEN 1.0 02/26/2015 1152   NITRITE NEGATIVE 08/26/2017 1948   LEUKOCYTESUR TRACE (A) 08/26/2017 1948     Radiology Studies: Reviewed images personally in health database    Scheduled Meds: . clopidogrel  75 mg Oral Q breakfast  . docusate sodium  100 mg Oral BID  . enoxaparin (LOVENOX) injection  40 mg Subcutaneous Q24H  . famotidine  20 mg Oral BID  . gabapentin  800 mg Oral BID  . insulin aspart  0-5 Units Subcutaneous QHS  . insulin aspart  0-9 Units Subcutaneous TID WC  . insulin glargine  20 Units Subcutaneous Daily   And  . insulin glargine  30 Units Subcutaneous QHS  . lurasidone  120 mg Oral QHS  . metFORMIN  1,500 mg Oral BID WC  . metoprolol tartrate  12.5 mg Oral BID  .  metroNIDAZOLE  500 mg Oral Q8H  . mometasone-formoterol  2 puff Inhalation BID  . mupirocin ointment   Nasal Daily  . nicotine  14 mg Transdermal Daily  . OXcarbazepine  600 mg Oral BID  . pantoprazole  40 mg Oral Daily  . simvastatin  40 mg Oral QPM   Continuous Infusions: . ceFEPime (MAXIPIME) IV Stopped (09/03/17 0230)  . vancomycin       LOS: 1 day    Time spent: [place], MD [facility]   If 7PM-7AM, please contact night-coverage [email or web address] Password [facility] 09/03/2017, 7:55 AM

## 2017-09-04 LAB — RENAL FUNCTION PANEL
ALBUMIN: 2.2 g/dL — AB (ref 3.5–5.0)
ANION GAP: 6 (ref 5–15)
BUN: 15 mg/dL (ref 6–20)
CHLORIDE: 108 mmol/L (ref 101–111)
CO2: 21 mmol/L — ABNORMAL LOW (ref 22–32)
Calcium: 8.3 mg/dL — ABNORMAL LOW (ref 8.9–10.3)
Creatinine, Ser: 1.35 mg/dL — ABNORMAL HIGH (ref 0.44–1.00)
GFR, EST AFRICAN AMERICAN: 57 mL/min — AB (ref >60)
GFR, EST NON AFRICAN AMERICAN: 49 mL/min — AB (ref >60)
Glucose, Bld: 156 mg/dL — ABNORMAL HIGH (ref 65–99)
PHOSPHORUS: 3.7 mg/dL (ref 2.5–4.6)
POTASSIUM: 4.4 mmol/L (ref 3.5–5.1)
Sodium: 135 mmol/L (ref 135–145)

## 2017-09-04 LAB — GLUCOSE, CAPILLARY
GLUCOSE-CAPILLARY: 150 mg/dL — AB (ref 65–99)
GLUCOSE-CAPILLARY: 80 mg/dL (ref 65–99)
Glucose-Capillary: 82 mg/dL (ref 65–99)
Glucose-Capillary: 80 mg/dL (ref 65–99)

## 2017-09-04 MED ORDER — DEXTROSE 5 % IV SOLN
2.0000 g | Freq: Three times a day (TID) | INTRAVENOUS | Status: DC
  Filled 2017-09-04 (×3): qty 2

## 2017-09-04 MED ORDER — ALPRAZOLAM 0.5 MG PO TABS
2.0000 mg | ORAL_TABLET | Freq: Four times a day (QID) | ORAL | Status: DC | PRN
  Administered 2017-09-07: 15:00:00 2 mg via ORAL
  Filled 2017-09-04 (×2): qty 4

## 2017-09-04 NOTE — Progress Notes (Signed)
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 placement of VAC of the same toes 08/11/17 left leg pain and toe pain Dopplers = good waveform-palpable dorsalis pedis on the left with ischemia--was sent home not needing antibiotics and told to follow-up Came back to the emergency room 11/16 and 11/20 with foot opening up bleeding and returned ultimately 09/02/2017 with foul-smelling drainage from open wound and burning in the foot with T-max 100.7  BUN/creatinine on admission 20/1.3 lactic acid 0.8 WBC 12.9 Foot x-ray showed no lytic lesions suggestive of osteomyelitis Vascular surgeon consulted as below  Assessment & Plan:   Active Problems:   Type II diabetes mellitus with renal manifestations, uncontrolled (HCC)   Essential hypertension   Great toe pain, left   Surgical wound, non healing   Bipolar 1 disorder (HCC)   PVD, Ulcer and non-healing wound with ischemia Appreciate Vascular input Continue Maxipime, Flagyl and Vanc started on 11/23 Daily soaks in soap and dry dressing changes as per vascular surgery Areteriography as per Dr. Scot Dock 11/26--? Trans-met amputation as well? 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 Last a1c  11.8 on 02/02/17 Metformin held on 11/25 given impending angiography Cont lantus 20am , 30 pm-sugars ranging between 150 and 218 Continue gabapentin 800 twice daily  CKD ii Creat down from 1.47-->1.2 [baselein between 1.4 + 1.5] -Currently creatinine 1.3  Saline started 11/24 PM  Bipolar Cont Xanax 2 qid, Latuda 120 hs, ambien 5 daily--held trileptal 600 bid on admit but can resume 11/25  Gerd cont pepcid 20 bid and protonix 40 qd    Consultants:   vadcular  Procedures:    none  Antimicrobials:   Vanc, flagyl, caefepime    Subjective:  Doing fair No issue Tolerating diet no further nausea No chest pain  Objective: Vitals:   09/03/17 1341 09/03/17 1940 09/03/17 2056 09/04/17 0504  BP: (!) 129/92  138/89 138/86  Pulse: 86  80 93  Resp:   16 16  Temp: 97.9 F (36.6 C)  99.1 F (37.3 C) 99.5 F (37.5 C)  TempSrc: Oral  Oral Oral  SpO2: 100% 100% 100% 100%  Weight:      Height:        Intake/Output Summary (Last 24 hours) at 09/04/2017 1016 Last data filed at 09/04/2017 07681Gross per 24 hour  Intake 2091.66 ml  Output -  Net 2091.66 ml   Filed Weights   09/02/17 1340 09/02/17 1952  Weight: 63.5 kg (140 lb) 63.5 kg (140 lb)    Examination:  dishevelled in nad S1-S2 no murmur slightly tachycardic wound exam as below  EOMI NCAT was sleeping on coming into the room but then tells me she has pain Chest is clear no added sound abdomen soft       Data Reviewed: I have personally reviewed following  labs and imaging studies  CBC: Recent Labs  Lab 08/29/17 2327 09/02/17 1212 09/03/17 0523  WBC 12.1* 12.9* 11.0*  NEUTROABS 6.5 8.8*  --   HGB 10.6* 11.5* 10.1*  HCT 31.6* 33.1* 29.4*  MCV 89.3 88.3 88.0  PLT 275 263 665   Basic Metabolic Panel: Recent Labs  Lab 08/29/17 2327 09/02/17 1212 09/03/17 0523 09/04/17 0716  NA 135 135 135 135  K 4.2 4.4 4.0 4.4  CL 103 105 105 108  CO2 '25 23 25 ' 21*  GLUCOSE 194* 293* 126* 156*  BUN '15 20  17 15  ' CREATININE 1.47* 1.37* 1.26* 1.35*  CALCIUM 8.7* 8.8* 8.3* 8.3*  PHOS  --   --   --  3.7   GFR: Estimated Creatinine Clearance: 56.6 mL/min (A) (by C-G formula based on SCr of 1.35 mg/dL (H)). Liver Function Tests: Recent Labs  Lab 08/29/17 2327 09/04/17 0716  AST 14*  --   ALT 5*  --   ALKPHOS 63  --   BILITOT 0.2*  --   PROT 6.5  --   ALBUMIN 2.7* 2.2*   No results for input(s): LIPASE, AMYLASE in the last 168 hours. No results for input(s): AMMONIA in the last 168 hours. Coagulation Profile: No results for input(s): INR, PROTIME in the last 168 hours. Cardiac Enzymes: No results for input(s): CKTOTAL, CKMB, CKMBINDEX, TROPONINI in the last 168 hours. BNP (last 3 results) No results for input(s): PROBNP in the last 8760 hours. HbA1C: No results for input(s): HGBA1C in the last 72 hours. CBG: Recent Labs  Lab 09/02/17 2103 09/03/17 0747 09/03/17 1247 09/03/17 2057 09/04/17 0800  GLUCAP 217* 104* 114* 218* 150*   Lipid Profile: No results for input(s): CHOL, HDL, LDLCALC, TRIG, CHOLHDL, LDLDIRECT in the last 72 hours. Thyroid Function Tests: No results for input(s): TSH, T4TOTAL, FREET4, T3FREE, THYROIDAB in the last 72 hours. Anemia Panel: No results for input(s): VITAMINB12, FOLATE, FERRITIN, TIBC, IRON, RETICCTPCT in the last 72 hours. Urine analysis:    Component Value Date/Time   COLORURINE AMBER (A) 08/26/2017 1948   APPEARANCEUR CLOUDY (A) 08/26/2017 1948   LABSPEC 1.025 08/26/2017 1948   PHURINE 5.0 08/26/2017 1948   GLUCOSEU >=500 (A) 08/26/2017 1948   HGBUR SMALL (A) 08/26/2017 1948   BILIRUBINUR SMALL (A) 08/26/2017 1948   KETONESUR NEGATIVE 08/26/2017 1948   PROTEINUR 100 (A) 08/26/2017 1948   UROBILINOGEN 1.0 02/26/2015 1152   NITRITE NEGATIVE 08/26/2017 1948   LEUKOCYTESUR TRACE (A) 08/26/2017 1948     Radiology Studies: Reviewed images personally in health database    Scheduled Meds: . clopidogrel  75 mg Oral Q breakfast  .  docusate sodium  100 mg Oral BID  . enoxaparin (LOVENOX) injection  40 mg Subcutaneous Q24H  . famotidine  20 mg Oral BID  . gabapentin  800 mg Oral BID  . insulin aspart  0-5 Units Subcutaneous QHS  . insulin aspart  0-9 Units Subcutaneous TID WC  . insulin glargine  20 Units Subcutaneous Daily   And  . insulin glargine  30 Units Subcutaneous QHS  . lurasidone  120 mg Oral QHS  . metFORMIN  1,500 mg Oral BID WC  . metoprolol tartrate  12.5 mg Oral BID  . metroNIDAZOLE  500 mg Oral Q8H  . mometasone-formoterol  2 puff Inhalation BID  . mupirocin ointment   Nasal Daily  . nicotine  14 mg Transdermal Daily  . OXcarbazepine  600 mg Oral BID  . pantoprazole  40  mg Oral Daily  . simvastatin  40 mg Oral QPM   Continuous Infusions: . ceFEPime (MAXIPIME) IV Stopped (09/04/17 0322)  . dextrose 5 % and 0.9% NaCl 100 mL/hr at 09/04/17 0504  . vancomycin Stopped (09/03/17 1719)     LOS: 2 days    Time spent: [place], MD [facility]   If 7PM-7AM, please contact night-coverage [email or web address] Password [email or web address] 09/04/2017, 10:16 AM

## 2017-09-04 NOTE — Progress Notes (Signed)
   VASCULAR SURGERY ASSESSMENT & PLAN:   No change in left foot.  She continues intravenous antibiotics (cefepime, Flagyl, and vancomycin).  I have scheduled her for an arteriogram tomorrow and possible intervention on the left leg.  Procedure and risks have been discussed with the patient.  Continue gentle hydration in anticipation of angiogram tomorrow.  SUBJECTIVE:   No complaints.  PHYSICAL EXAM:   Vitals:   09/03/17 1341 09/03/17 1940 09/03/17 2056 09/04/17 0504  BP: (!) 129/92  138/89 138/86  Pulse: 86  80 93  Resp:   16 16  Temp: 97.9 F (36.6 C)  99.1 F (37.3 C) 99.5 F (37.5 C)  TempSrc: Oral  Oral Oral  SpO2: 100% 100% 100% 100%  Weight:      Height:       Still with some erythema lateral aspect of the left foot.  LABS:   Lab Results  Component Value Date   WBC 11.0 (H) 09/03/2017   HGB 10.1 (L) 09/03/2017   HCT 29.4 (L) 09/03/2017   MCV 88.0 09/03/2017   PLT 227 09/03/2017   Lab Results  Component Value Date   CREATININE 1.26 (H) 09/03/2017   Lab Results  Component Value Date   INR 0.91 08/10/2017   CBG (last 3)  Recent Labs    09/03/17 0747 09/03/17 1247 09/03/17 2057  GLUCAP 104* 114* 218*    PROBLEM LIST:    Active Problems:   Type II diabetes mellitus with renal manifestations, uncontrolled (HCC)   Essential hypertension   Great toe pain, left   Surgical wound, non healing   Bipolar 1 disorder (HCC)   CURRENT MEDS:   . clopidogrel  75 mg Oral Q breakfast  . docusate sodium  100 mg Oral BID  . enoxaparin (LOVENOX) injection  40 mg Subcutaneous Q24H  . famotidine  20 mg Oral BID  . gabapentin  800 mg Oral BID  . insulin aspart  0-5 Units Subcutaneous QHS  . insulin aspart  0-9 Units Subcutaneous TID WC  . insulin glargine  20 Units Subcutaneous Daily   And  . insulin glargine  30 Units Subcutaneous QHS  . lurasidone  120 mg Oral QHS  . metFORMIN  1,500 mg Oral BID WC  . metoprolol tartrate  12.5 mg Oral BID  .  metroNIDAZOLE  500 mg Oral Q8H  . mometasone-formoterol  2 puff Inhalation BID  . mupirocin ointment   Nasal Daily  . nicotine  14 mg Transdermal Daily  . OXcarbazepine  600 mg Oral BID  . pantoprazole  40 mg Oral Daily  . simvastatin  40 mg Oral QPM    Deitra Mayo Beeper: [phone] Office: [phone] 09/04/2017

## 2017-09-05 ENCOUNTER — Encounter (HOSPITAL_COMMUNITY)
Admission: EM | Disposition: A | Discharge status: Home / Self Care | Payer: Self-pay | Source: Home / Self Care | Attending: Family Medicine

## 2017-09-05 LAB — CBC
HCT: 28 % — ABNORMAL LOW (ref 36.0–46.0)
HEMOGLOBIN: 9.6 g/dL — AB (ref 12.0–15.0)
MCH: 30.2 pg (ref 26.0–34.0)
MCHC: 34.3 g/dL (ref 30.0–36.0)
MCV: 88.1 fL (ref 78.0–100.0)
PLATELETS: 240 10*3/uL (ref 150–400)
RBC: 3.18 MIL/uL — AB (ref 3.87–5.11)
RDW: 12.2 % (ref 11.5–15.5)
WBC: 15.6 10*3/uL — AB (ref 4.0–10.5)

## 2017-09-05 LAB — PROTIME-INR
INR: 1.32
PROTHROMBIN TIME: 16.3 s — AB (ref 11.4–15.2)

## 2017-09-05 LAB — RENAL FUNCTION PANEL
ANION GAP: 5 (ref 5–15)
Albumin: 2.1 g/dL — ABNORMAL LOW (ref 3.5–5.0)
BUN: 12 mg/dL (ref 6–20)
CALCIUM: 8.1 mg/dL — AB (ref 8.9–10.3)
CHLORIDE: 110 mmol/L (ref 101–111)
CO2: 21 mmol/L — AB (ref 22–32)
Creatinine, Ser: 1.29 mg/dL — ABNORMAL HIGH (ref 0.44–1.00)
GFR calc Af Amer: >60 mL/min (ref >60)
GFR calc non Af Amer: 52 mL/min — ABNORMAL LOW (ref >60)
GLUCOSE: 98 mg/dL (ref 65–99)
POTASSIUM: 4 mmol/L (ref 3.5–5.1)
Phosphorus: 2.2 mg/dL — ABNORMAL LOW (ref 2.5–4.6)
Sodium: 136 mmol/L (ref 135–145)

## 2017-09-05 LAB — GLUCOSE, CAPILLARY
GLUCOSE-CAPILLARY: 82 mg/dL (ref 65–99)
Glucose-Capillary: 117 mg/dL — ABNORMAL HIGH (ref 65–99)
Glucose-Capillary: 84 mg/dL (ref 65–99)
Glucose-Capillary: 77 mg/dL (ref 65–99)
Glucose-Capillary: 149 mg/dL — ABNORMAL HIGH (ref 65–99)

## 2017-09-05 SURGERY — ABDOMINAL AORTOGRAM W/LOWER EXTREMITY
Anesthesia: LOCAL

## 2017-09-05 MED ORDER — METOCLOPRAMIDE HCL 5 MG/ML IJ SOLN
10.0000 mg | Freq: Three times a day (TID) | INTRAMUSCULAR | Status: DC
  Administered 2017-09-05 – 2017-09-06 (×3): 10 mg via INTRAVENOUS
  Filled 2017-09-05 (×3): qty 2

## 2017-09-05 MED ORDER — SODIUM CHLORIDE 0.9 % IV SOLN
INTRAVENOUS | Status: DC
  Administered 2017-09-05 (×2): via INTRAVENOUS
  Administered 2017-09-06: 100 mL/h via INTRAVENOUS

## 2017-09-05 MED ORDER — PIPERACILLIN-TAZOBACTAM 3.375 G IVPB
3.3750 g | Freq: Three times a day (TID) | INTRAVENOUS | Status: DC
  Administered 2017-09-05 – 2017-09-08 (×9): 3.375 g via INTRAVENOUS
  Filled 2017-09-05 (×11): qty 50

## 2017-09-05 NOTE — Progress Notes (Signed)
Inpatient Diabetes Program Recommendations  AACE/ADA: New Consensus Statement on Inpatient Glycemic Control (2015)  Target Ranges:  Prepandial:   less than 140 mg/dL      Peak postprandial:   less than 180 mg/dL (1-2 hours)      Critically ill patients:  140 - 180 mg/dL   Lab Results  Component Value Date   GLUCAP 82 09/05/2017   HGBA1C 11.8 (H) 02/02/2017    Review of Glycemic ControlResults for Kathy Phelps, Kathy Phelps (MRN [id number]) as of 09/05/2017 09:28  Ref. Range 09/04/2017 08:00 09/04/2017 13:02 09/04/2017 17:46 09/04/2017 21:20 09/05/2017 08:14  Glucose-Capillary Latest Ref Range: 65 - 99 mg/dL 150 (H) 80 82 80 82   Diabetes history: Type 2 DM Outpatient Diabetes medications: Lantus 20 units q AM and 30 units q PM, Novolog >300 mg/dL-15 units, <300 do not use, Metformin 1500 bid Current orders for Inpatient glycemic control: Novolog sensitive tid with meals and HS, Lantus 20 units daily/ Lantus 30 units q HS  Inpatient Diabetes Program Recommendations:   Blood sugars less than 100 mg/dL. Please consider reducing Lantus to 20 units daily.    Thanks,  Adah Perl, RN, BC-ADM Inpatient Diabetes Coordinator Pager [phone] (8a-5p)

## 2017-09-05 NOTE — Progress Notes (Signed)
  Progress Note    09/05/2017 5:45 PM Day of Surgery  Subjective:  Refusing intervention in pv lab today  Vitals:   09/05/17 0538 09/05/17 1300  BP: 133/90 (!) 137/92  Pulse: 94 94  Resp: 16 18  Temp: 99.3 F (37.4 C) 99.1 F (37.3 C)  SpO2: 96% 100%    Physical Exam: aaox3 Palpable left popliteal pulse Stable left lateral foot wound  CBC    Component Value Date/Time   WBC 15.6 (H) 09/05/2017 0620   RBC 3.18 (L) 09/05/2017 0620   HGB 9.6 (L) 09/05/2017 0620   HCT 28.0 (L) 09/05/2017 0620   PLT 240 09/05/2017 0620   MCV 88.1 09/05/2017 0620   MCH 30.2 09/05/2017 0620   MCHC 34.3 09/05/2017 0620   RDW 12.2 09/05/2017 0620   LYMPHSABS 2.6 09/02/2017 1212   MONOABS 0.9 09/02/2017 1212   EOSABS 0.6 09/02/2017 1212   BASOSABS 0.0 09/02/2017 1212    BMET    Component Value Date/Time   NA 136 09/05/2017 0620   K 4.0 09/05/2017 0620   CL 110 09/05/2017 0620   CO2 21 (L) 09/05/2017 0620   GLUCOSE 98 09/05/2017 0620   BUN 12 09/05/2017 0620   CREATININE 1.29 (H) 09/05/2017 0620   CALCIUM 8.1 (L) 09/05/2017 0620   GFRNONAA 52 (L) 09/05/2017 0620   GFRAA >60 09/05/2017 0620    INR    Component Value Date/Time   INR 1.32 09/05/2017 0620     Intake/Output Summary (Last 24 hours) at 09/05/2017 1745 Last data filed at 09/05/2017 0600 Gross per 24 hour  Intake 1420 ml  Output -  Net 1420 ml     Assessment/plan:  36 y.o. female is s/p revascularization of left lower extremity and healed left toes 3-5 amputation. Now has wound from minimal trauma. Will plan for aortogram and left lower intervention on Wednesday which she has agreed to.      Castulo Scarpelli C. Donzetta Matters, MD [facility] Office: [phone] Pager: [phone]  09/05/2017 5:45 PM

## 2017-09-05 NOTE — Progress Notes (Addendum)
Pharmacy Antibiotic Note  Kathy Phelps is a 36 y.o. female admitted on 09/02/2017 with a non-healing surgical site infection.  Pharmacy has been consulted to transition from cefepime to Zosyn + vancomycin dosing, also receiving flagyl per MD. Noted, cefepime has not been given since 1330 on 11/25 due "patient/family refused" per [facility]. Vancomycin dose increased for improving renal function 11/25.  Afebrile, WBC up 15.6. SCr stable 1.29. Angiogram by Vascular scheduled for today postponed.  Plan: D/c Cefepime Zosyn 3.375g IV q8h (4h inf) Flagyl 500mg  IV q8h per MD Continue vancomycin to 1250mg  IV every 24 hrs Check vancomycin trough at steady state Follow up renal function & cultures F/u Vascular plans  Height: 5\' 9"  (175.3 cm) Weight: 140 lb (63.5 kg) IBW/kg (Calculated) : 66.2  Temp (24hrs), Avg:99.1 F (37.3 C), Min:99 F (37.2 C), Max:99.3 F (37.4 C)  Recent Labs  Lab 08/29/17 2327 08/29/17 2344 09/02/17 1212 09/02/17 1221 09/03/17 0523 09/04/17 0716 09/05/17 0620  WBC 12.1*  --  12.9*  --  11.0*  --  15.6*  CREATININE 1.47*  --  1.37*  --  1.26* 1.35* 1.29*  LATICACIDVEN  --  1.70  --  0.80  --   --   --     Estimated Creatinine Clearance: 59.3 mL/min (A) (by C-G formula based on SCr of 1.29 mg/dL (H)).    Allergies  Allergen Reactions  . Hydrocodone-Acetaminophen Hives  . Tylenol [Acetaminophen] Itching and Swelling    SWELLING REACTION UNSPECIFIED     Antimicrobials this admission: 11/23 Rocephin x 1 11/23 Cefepime >>11/26 11/23 Flagyl >> 11/23 Vancomycin >> 11/26 zosyn >>  Dose adjustments this admission:   Microbiology results: 11/23 BCx: ngtd  Elicia Lamp, PharmD, BCPS Clinical Pharmacist Rx Phone # for today: [phone] After 3:30PM, please call Main Rx: [phone] 09/05/2017 2:16 PM

## 2017-09-05 NOTE — Progress Notes (Signed)
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 placement of VAC of the same toes 08/11/17 left leg pain and toe pain Dopplers = good waveform-palpable dorsalis pedis on the left with ischemia--was sent home not needing antibiotics and told to follow-up Came back to the emergency room 11/16 and 11/20 with foot opening up bleeding and returned ultimately 09/02/2017 with foul-smelling drainage from open wound and burning in the foot with T-max 100.7  BUN/creatinine on admission 20/1.3 lactic acid 0.8 WBC 12.9 Foot x-ray showed no lytic lesions suggestive of osteomyelitis Vascular surgeon consulted as below  Assessment & Plan:   Active Problems:   Type II diabetes mellitus with renal manifestations, uncontrolled (HCC)   Essential hypertension   Great toe pain, left   Surgical wound, non healing   Bipolar 1 disorder (HCC)   PVD, Ulcer and non-healing wound with ischemia Appreciate Vascular input Change abx to zosyn and vanc given n and intolerance 11/26 Daily soaks in soap and dry dressing changes  Arteriography as per Dr. 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 Last a1c  11.8 on 02/02/17 Metformin held on 11/25 given impending angiography Cont lantus 20am , 30 pm-sugars slight lows noted--monitor trends Continue gabapentin 800 twice daily  CKD ii Creat down from 1.47-->1.2 [baselein between 1.4 + 1.5] -Currently creatinine 1.3  Saline started 11/24 PM  Bipolar Cont Xanax 2 qid, Latuda 120 hs, ambien 5 daily--held trileptal 600 bid on admit but can resume 11/25  Gerd cont pepcid 20 bid and protonix 40 qd    Consultants:   vadcular  Procedures:    none  Antimicrobials:   Vanc, flagyl, caefepime    Subjective:  Refusing meds Refusing food nauseous  Objective: Vitals:   09/04/17 1245 09/04/17 2041 09/04/17 2126 09/05/17 0538  BP: 136/83  (!) 143/89 133/90  Pulse: 95  95 94  Resp: 13  16 16   Temp: 99.9 F (37.7 C)  99 F (37.2 C) 99.3 F (37.4 C)  TempSrc: Oral  Oral Oral  SpO2: 100% 99% 100% 96%  Weight:      Height:        Intake/Output Summary (Last 24 hours) at 09/05/2017 1350 Last data filed at 09/05/2017 0600 Gross per 24 hour  Intake 2473.33 ml  Output -  Net 2473.33 ml   Filed Weights   09/02/17 1340 09/02/17 1952  Weight: 63.5 kg (140 lb) 63.5 kg (140 lb)    Examination:  Awake alert Chest clear no added sound S1-S2 no murmur slightly tachycardic  EOMI NCAT Neuro intact Wound not seen today   Data Reviewed: I have personally reviewed following labs and imaging studies  CBC: Recent Labs  Lab 08/29/17 2327 09/02/17 1212 09/03/17 0523 09/05/17 4081  WBC 12.1* 12.9* 11.0* 15.6*  NEUTROABS 6.5 8.8*  --   --   HGB 10.6* 11.5* 10.1* 9.6*  HCT 31.6* 33.1* 29.4* 28.0*  MCV 89.3 88.3 88.0 88.1  PLT 275 263 227 315   Basic Metabolic Panel: Recent Labs  Lab 08/29/17 2327 09/02/17 1212 09/03/17 0523 09/04/17 0716 09/05/17 0620  NA 135 135 135 135 136  K 4.2 4.4 4.0 4.4 4.0  CL 103 105 105 108 110  CO2 25 23 25  21* 21*  GLUCOSE 194* 293* 126* 156* 98  BUN 15 20 17 15 12   CREATININE 1.47*  1.37* 1.26* 1.35* 1.29*  CALCIUM 8.7* 8.8* 8.3* 8.3* 8.1*  PHOS  --   --   --  3.7 2.2*   GFR: Estimated Creatinine Clearance: 59.3 mL/min (A) (by C-G formula based on SCr of 1.29 mg/dL (H)). Liver Function Tests: Recent Labs  Lab 08/29/17 2327 09/04/17 0716 09/05/17 0620  AST 14*  --   --   ALT 5*  --   --   ALKPHOS 63  --   --   BILITOT 0.2*  --   --   PROT 6.5  --   --   ALBUMIN 2.7* 2.2* 2.1*   No results for input(s): LIPASE, AMYLASE in the last 168 hours. No results for input(s): AMMONIA in the last 168 hours. Coagulation Profile: Recent Labs  Lab 09/05/17 0620  INR 1.32   Cardiac Enzymes: No results for input(s): CKTOTAL, CKMB, CKMBINDEX, TROPONINI in the last 168 hours. BNP (last 3 results) No results for input(s): PROBNP in the last 8760 hours. HbA1C: No results for input(s): HGBA1C in the last 72 hours. CBG: Recent Labs  Lab 09/04/17 1302 09/04/17 1746 09/04/17 2120 09/05/17 0814 09/05/17 1158  GLUCAP 80 82 80 82 84   Lipid Profile: No results for input(s): CHOL, HDL, LDLCALC, TRIG, CHOLHDL, LDLDIRECT in the last 72 hours. Thyroid Function Tests: No results for input(s): TSH, T4TOTAL, FREET4, T3FREE, THYROIDAB in the last 72 hours. Anemia Panel: No results for input(s): VITAMINB12, FOLATE, FERRITIN, TIBC, IRON, RETICCTPCT in the last 72 hours. Urine analysis:    Component Value Date/Time   COLORURINE AMBER (A) 08/26/2017 1948   APPEARANCEUR CLOUDY (A) 08/26/2017 1948   LABSPEC 1.025 08/26/2017 1948   PHURINE 5.0 08/26/2017 1948   GLUCOSEU >=500 (A) 08/26/2017 1948   HGBUR SMALL (A) 08/26/2017 1948   BILIRUBINUR SMALL (A) 08/26/2017 1948   KETONESUR NEGATIVE 08/26/2017 1948   PROTEINUR 100 (A) 08/26/2017 1948   UROBILINOGEN 1.0 02/26/2015 1152   NITRITE NEGATIVE 08/26/2017 1948   LEUKOCYTESUR TRACE (A) 08/26/2017 1948     Radiology Studies: Reviewed images personally in health database    Scheduled Meds: . clopidogrel  75 mg Oral Q  breakfast  . docusate sodium  100 mg Oral BID  . enoxaparin (LOVENOX) injection  40 mg Subcutaneous Q24H  . famotidine  20 mg Oral BID  . gabapentin  800 mg Oral BID  . insulin aspart  0-5 Units Subcutaneous QHS  . insulin aspart  0-9 Units Subcutaneous TID WC  . insulin glargine  20 Units Subcutaneous Daily   And  . insulin glargine  30 Units Subcutaneous QHS  . lurasidone  120 mg Oral QHS  . metoprolol tartrate  12.5 mg Oral BID  . metroNIDAZOLE  500 mg Oral Q8H  . mometasone-formoterol  2 puff Inhalation BID  . mupirocin ointment   Nasal Daily  . nicotine  14 mg Transdermal Daily  .  OXcarbazepine  600 mg Oral BID  . pantoprazole  40 mg Oral Daily  . simvastatin  40 mg Oral QPM   Continuous Infusions: . sodium chloride 100 mL/hr at 09/05/17 0540  . ceFEPime (MAXIPIME) IV    . dextrose 5 % and 0.9% NaCl Stopped (09/05/17 0540)  . vancomycin Stopped (09/04/17 1559)     LOS: 3 days    Time spent: [place], MD [facility]   If 7PM-7AM, please contact night-coverage [email or web address] Password [email or web address] 09/05/2017, 1:50 PM

## 2017-09-05 NOTE — Progress Notes (Signed)
Patient had complaints of nausea and vomiting this morning. Available prn antiemetics were given. Later notified the MD, as the patient was refusing all medications, oral and intravenous. After discussion with MD and antibiotic changes patient agreed to take all medications for the day, even oral. Will continue to monitor.

## 2017-09-06 ENCOUNTER — Inpatient Hospital Stay (HOSPITAL_COMMUNITY): Payer: Medicaid Other

## 2017-09-06 LAB — HEPATIC FUNCTION PANEL
ALBUMIN: 2.2 g/dL — AB (ref 3.5–5.0)
ALK PHOS: 77 U/L (ref 38–126)
ALT: 7 U/L — AB (ref 14–54)
AST: 12 U/L — AB (ref 15–41)
Bilirubin, Direct: <0.1 mg/dL — ABNORMAL LOW (ref 0.1–0.5)
TOTAL PROTEIN: 6.3 g/dL — AB (ref 6.5–8.1)
Total Bilirubin: 0.4 mg/dL (ref 0.3–1.2)

## 2017-09-06 LAB — BASIC METABOLIC PANEL
ANION GAP: 6 (ref 5–15)
BUN: 9 mg/dL (ref 6–20)
CALCIUM: 8.1 mg/dL — AB (ref 8.9–10.3)
CHLORIDE: 107 mmol/L (ref 101–111)
CO2: 23 mmol/L (ref 22–32)
CREATININE: 1.2 mg/dL — AB (ref 0.44–1.00)
GFR calc non Af Amer: 57 mL/min — ABNORMAL LOW (ref >60)
Glucose, Bld: 107 mg/dL — ABNORMAL HIGH (ref 65–99)
Potassium: 3.9 mmol/L (ref 3.5–5.1)
SODIUM: 136 mmol/L (ref 135–145)

## 2017-09-06 LAB — CBC WITH DIFFERENTIAL/PLATELET
BASOS PCT: 0 %
Basophils Absolute: 0 10*3/uL (ref 0.0–0.1)
EOS ABS: 0.5 10*3/uL (ref 0.0–0.7)
Eosinophils Relative: 4 %
HEMATOCRIT: 27.9 % — AB (ref 36.0–46.0)
HEMOGLOBIN: 9.4 g/dL — AB (ref 12.0–15.0)
Lymphocytes Relative: 16 %
Lymphs Abs: 1.8 10*3/uL (ref 0.7–4.0)
MCH: 29.9 pg (ref 26.0–34.0)
MCHC: 33.7 g/dL (ref 30.0–36.0)
MCV: 88.9 fL (ref 78.0–100.0)
Monocytes Absolute: 0.8 10*3/uL (ref 0.1–1.0)
Monocytes Relative: 7 %
NEUTROS ABS: 8.6 10*3/uL — AB (ref 1.7–7.7)
NEUTROS PCT: 73 %
Platelets: 255 10*3/uL (ref 150–400)
RBC: 3.14 MIL/uL — AB (ref 3.87–5.11)
RDW: 12.2 % (ref 11.5–15.5)
WBC: 11.7 10*3/uL — AB (ref 4.0–10.5)

## 2017-09-06 LAB — GLUCOSE, CAPILLARY
GLUCOSE-CAPILLARY: 139 mg/dL — AB (ref 65–99)
GLUCOSE-CAPILLARY: 90 mg/dL (ref 65–99)
Glucose-Capillary: 105 mg/dL — ABNORMAL HIGH (ref 65–99)
Glucose-Capillary: 161 mg/dL — ABNORMAL HIGH (ref 65–99)

## 2017-09-06 LAB — LIPASE, BLOOD: Lipase: 18 U/L (ref 11–51)

## 2017-09-06 MED ORDER — PANTOPRAZOLE SODIUM 40 MG PO TBEC
40.0000 mg | DELAYED_RELEASE_TABLET | Freq: Every day | ORAL | Status: DC
  Administered 2017-09-07 – 2017-09-08 (×2): 40 mg via ORAL
  Filled 2017-09-06 (×2): qty 1

## 2017-09-06 MED ORDER — METOCLOPRAMIDE HCL 5 MG/ML IJ SOLN
20.0000 mg | Freq: Three times a day (TID) | INTRAVENOUS | Status: DC
  Administered 2017-09-06 – 2017-09-08 (×6): 20 mg via INTRAVENOUS
  Filled 2017-09-06 (×8): qty 4

## 2017-09-06 MED ORDER — PRO-STAT SUGAR FREE PO LIQD
30.0000 mL | Freq: Two times a day (BID) | ORAL | Status: DC
  Administered 2017-09-06: 30 mL via ORAL
  Filled 2017-09-06 (×2): qty 30

## 2017-09-06 MED ORDER — ADULT MULTIVITAMIN W/MINERALS CH
1.0000 | ORAL_TABLET | Freq: Every day | ORAL | Status: DC
  Administered 2017-09-06 – 2017-09-08 (×3): 1 via ORAL
  Filled 2017-09-06 (×3): qty 1

## 2017-09-06 MED ORDER — SORBITOL 70 % SOLN: 20.0000 mL | Freq: Every day | Status: DC | PRN

## 2017-09-06 NOTE — Progress Notes (Signed)
Inpatient Diabetes Program Recommendations  AACE/ADA: New Consensus Statement on Inpatient Glycemic Control (2015)  Target Ranges:  Prepandial:   less than 140 mg/dL      Peak postprandial:   less than 180 mg/dL (1-2 hours)      Critically ill patients:  140 - 180 mg/dL   Lab Results  Component Value Date   GLUCAP 139 (H) 09/06/2017   HGBA1C 11.8 (H) 02/02/2017    Review of Glycemic ControlResults for Kathy Phelps, Kathy Phelps (MRN [id number]) as of 09/06/2017 11:38  Ref. Range 09/05/2017 08:14 09/05/2017 11:58 09/05/2017 16:41 09/05/2017 21:33 09/06/2017 07:41  Glucose-Capillary Latest Ref Range: 65 - 99 mg/dL 82 84 77 149 (H) 139 (H)   Diabetes history: Type 2 DM Outpatient Diabetes medications: Lantus 20 units q AM and 30 units q PM, Novolog >300 mg/dL-15 units, <300 do not use, Metformin 1500 bid Current orders for Inpatient glycemic control: Novolog sensitive tid with meals and HS, Lantus 20 units daily/ Lantus 30 units q HS Inpatient Diabetes Program Recommendations:    Please reduce Lantus to 20 units daily.  Text paged MD.    Thanks, Adah Perl, RN, BC-ADM Inpatient Diabetes Coordinator Pager [phone] (8a-5p)

## 2017-09-06 NOTE — Consult Note (Signed)
[facility] Consult  Referring Provider: Nita Sells, MD Primary Care Physician:  Rubbie Battiest, NP Primary Gastroenterologist: Althia Forts  Reason for Consultation:  Dysphagia  HPI: Kathy Phelps is a 36 y.o. female is admitted on 09/02/17 for a nonhealing surgical site infection.  GI was consulted for difficulty swallowing. Patient states that since Thursday of last week she is not able to tolerate solids, liquids or even pills. She states that they feel stuck at the back of her throat and mid chest. Within a minute of taking any form of food, she has nausea and vomits it out. This is not associated with pain on swallowing, denies abdominal pain. She has regular bowel movements, denies blood in stool or black stools. No prior endoscopy. Denies unintentional weight loss or loss of appetite. She has had acid reflux for many years and takes Pepcid 20 mg twice a day with fair control of her symptoms.    Past Medical History:  Diagnosis Date  . Bipolar affective ([place])   . Complete miscarriage   . Depression   . Diabetes mellitus   . Hyperlipidemia   . Hypertension   . Pregnancy complication    HELP Syndrome  . Renal disorder   . Schizophrenia [facility])     Past Surgical History:  Procedure Laterality Date  . ABDOMINAL AORTOGRAM W/LOWER EXTREMITY N/A 02/03/2017   Procedure: Abdominal Aortogram w/Lower Extremity;  Surgeon: Waynetta Sandy, MD;  Location: [place] [facility];  Service: Cardiovascular;  Laterality: N/A;  . AMPUTATION Left 02/10/2017   Procedure: AMPUTATION TOES 3, 4 AND 5  LEFT FOOT;  Surgeon: Waynetta Sandy, MD;  Location: [place];  Service: Vascular;  Laterality: Left;  . CESAREAN SECTION    . CHOLECYSTECTOMY    . PERIPHERAL VASCULAR ATHERECTOMY  02/03/2017   Procedure: Peripheral Vascular Atherectomy;  Surgeon: Waynetta Sandy, MD;  Location: [place] [facility];  Service: Cardiovascular;;  . PERIPHERAL VASCULAR  BALLOON ANGIOPLASTY  02/03/2017   Procedure: Peripheral Vascular Balloon Angioplasty;  Surgeon: Waynetta Sandy, MD;  Location: [place] [facility];  Service: Cardiovascular;;  . TUBAL LIGATION      Prior to Admission medications   Medication Sig Start Date End Date Taking? Authorizing Provider  ADVAIR DISKUS 100-50 MCG/DOSE AEPB Inhale 1 puff into the lungs daily. 01/10/15  Yes [provider]  alprazolam Duanne Moron) 2 MG tablet Take 1 tablet (2 mg total) by mouth 4 (four) times daily as needed for sleep. 08/12/17  Yes Debbe Odea, MD  clopidogrel (PLAVIX) 75 MG tablet Take 1 tablet (75 mg total) by mouth daily with breakfast. 02/12/17  Yes Hosie Poisson, MD  famotidine (PEPCID) 20 MG tablet Take 1 tablet (20 mg total) by mouth 2 (two) times daily. 08/12/17  Yes Debbe Odea, MD  gabapentin (NEURONTIN) 800 MG tablet Take 1 tablet (800 mg total) by mouth 2 (two) times daily. Patient taking differently: Take 800 mg by mouth 5 (five) times daily.  02/11/17  Yes Hosie Poisson, MD  insulin glargine (LANTUS) 100 UNIT/ML injection Inject 0.2-0.3 mLs (20-30 Units total) into the skin 2 (two) times daily. 20 U in the morning and 30 U in the evening 08/12/17  Yes Gherghe, Costin M, MD  LATUDA 120 MG TABS Take 120 mg by mouth at bedtime. 04/12/16  Yes [provider]  metFORMIN (GLUCOPHAGE) 1000 MG tablet Take 1 tablet (1,000 mg total) by mouth 2 (two) times daily. Patient taking differently: Take 1,500 mg by mouth 2 (two)  times daily.  07/16/14  Yes Pisciotta, Elmyra Ricks, PA-C  metoprolol tartrate (LOPRESSOR) 25 MG tablet Take 0.5 tablets (12.5 mg total) by mouth 2 (two) times daily. 02/11/17  Yes Hosie Poisson, MD  nicotine (NICODERM CQ - DOSED IN MG/24 HOURS) 21 mg/24hr patch Place 1 patch (21 mg total) onto the skin daily. 02/12/17  Yes Hosie Poisson, MD  NOVOLOG 100 UNIT/ML injection Inject 0-30 Units into the skin 3 (three) times daily with meals. Sliding Scale:  >300 15 units < 300 do not use  08/12/17  Yes Gherghe, Vella Redhead, MD  oxcarbazepine (TRILEPTAL) 600 MG tablet Take 600 mg by mouth 2 (two) times daily. 04/12/16  Yes [provider]  pantoprazole (PROTONIX) 40 MG tablet Take 1 tablet (40 mg total) by mouth daily. 02/11/17  Yes Hosie Poisson, MD  simvastatin (ZOCOR) 40 MG tablet Take 40 mg by mouth every evening.  04/25/16  Yes [provider]  tamsulosin (FLOMAX) 0.4 MG CAPS capsule Take 0.4 mg by mouth daily. 04/12/16  Yes [provider]  zolpidem (AMBIEN) 10 MG tablet Take 10 mg by mouth at bedtime as needed for sleep. 04/26/16  Yes [provider]  Insulin Syringe-Needle U-100 (INSULIN SYRINGE .5CC/31GX5/16") 31G X 5/16" 0.5 ML MISC 1 each by Does not apply route QID. 08/12/17   Gherghe, Vella Redhead, MD  oxyCODONE-acetaminophen (PERCOCET/ROXICET) 5-325 MG tablet TK 1 T PO Q 6 H PRN FOR SEVERE PAIN 08/13/17   [provider]  PROAIR HFA 108 (90 BASE) MCG/ACT inhaler Inhale 2 puffs into the lungs every 6 (six) hours as needed for shortness of breath.  01/10/15   [provider]    Current Facility-Administered Medications  Medication Dose Route Frequency Provider Last Rate Last Dose  . albuterol (PROVENTIL) (2.5 MG/3ML) 0.083% nebulizer solution 3 mL  3 mL Inhalation Q6H PRN Geradine Girt, DO      . ALPRAZolam Duanne Moron) tablet 2 mg  2 mg Oral QID PRN Baggett, Brooke A, RPH      . clopidogrel (PLAVIX) tablet 75 mg  75 mg Oral Q breakfast Eulogio Bear U, DO   75 mg at 09/06/17 0754  . dextrose 5 %-0.9 % sodium chloride infusion   Intravenous Continuous Nita Sells, MD   Stopped at 09/05/17 0540  . enoxaparin (LOVENOX) injection 40 mg  40 mg Subcutaneous Q24H Vann, Jessica U, DO      . famotidine (PEPCID) tablet 20 mg  20 mg Oral BID Vann, Jessica U, DO   20 mg at 09/06/17 1042  . gabapentin (NEURONTIN) capsule 800 mg  800 mg Oral BID Eulogio Bear U, DO   800 mg at 09/06/17 1042  . HYDROmorphone (DILAUDID) injection 1 mg  1 mg  Intravenous Q4H PRN Eulogio Bear U, DO   1 mg at 09/06/17 1519  . insulin aspart (novoLOG) injection 0-5 Units  0-5 Units Subcutaneous QHS Geradine Girt, DO   2 Units at 09/03/17 2111  . insulin aspart (novoLOG) injection 0-9 Units  0-9 Units Subcutaneous TID WC Eulogio Bear U, DO   1 Units at 09/04/17 [id number]  . lurasidone (LATUDA) tablet 120 mg  120 mg Oral QHS Eulogio Bear U, DO   120 mg at 09/05/17 2232  . metoCLOPramide (REGLAN) 20 mg in dextrose 5 % 50 mL IVPB  20 mg Intravenous Q8H Nita Sells, MD 216 mL/hr at 09/06/17 1523 20 mg at 09/06/17 1523  . metoprolol tartrate (LOPRESSOR) tablet 12.5 mg  12.5 mg Oral BID Eliseo Squires,  Jessica U, DO   12.5 mg at 09/06/17 1043  . mometasone-formoterol (DULERA) 100-5 MCG/ACT inhaler 2 puff  2 puff Inhalation BID Eulogio Bear U, DO   2 puff at 09/04/17 2041  . mupirocin ointment (BACTROBAN) 2 %   Nasal Daily Angelia Mould, MD      . nicotine (NICODERM CQ - dosed in mg/24 hours) patch 14 mg  14 mg Transdermal Daily Eulogio Bear U, DO   14 mg at 09/04/17 1132  . ondansetron (ZOFRAN) tablet 4 mg  4 mg Oral Q6H PRN Eulogio Bear U, DO       Or  . ondansetron (ZOFRAN) injection 4 mg  4 mg Intravenous Q6H PRN Eulogio Bear U, DO   4 mg at 09/05/17 0840  . Oxcarbazepine (TRILEPTAL) tablet 600 mg  600 mg Oral BID Eulogio Bear U, DO   600 mg at 09/06/17 1042  . oxyCODONE-acetaminophen (PERCOCET/ROXICET) 5-325 MG per tablet 1 tablet  1 tablet Oral Q4H PRN Nita Sells, MD   1 tablet at 09/06/17 1348  . [START ON 09/07/2017] pantoprazole (PROTONIX) EC tablet 40 mg  40 mg Oral Daily Samtani, Jai-Gurmukh, MD      . piperacillin-tazobactam (ZOSYN) IVPB 3.375 g  3.375 g Intravenous Q8H Romona Curls, RPH 12.5 mL/hr at 09/06/17 1348 3.375 g at 09/06/17 1348  . promethazine (PHENERGAN) injection 12.5 mg  12.5 mg Intravenous Q6H PRN Nita Sells, MD   12.5 mg at 09/06/17 1038  . sorbitol 70 % solution 20 mL  20 mL Oral Daily PRN Nita Sells, MD      . vancomycin (VANCOCIN) 1,250 mg in sodium chloride 0.9 % 250 mL IVPB  1,250 mg Intravenous Q24H Priscella Mann, RPH   Stopped at 09/06/17 1518  . zolpidem (AMBIEN) tablet 5 mg  5 mg Oral QHS PRN Eulogio Bear U, DO        Allergies as of 09/02/2017 - Review Complete 09/02/2017  Allergen Reaction Noted  . Hydrocodone-acetaminophen Hives 07/16/2014  . Tylenol [acetaminophen] Itching and Swelling 02/27/2015    Family History  Problem Relation Age of Onset  . Diabetes Mellitus II Mother   . Heart disease Mother   . Heart disease Father     Social History   Socioeconomic History  . Marital status: Single    Spouse name: Not on file  . Number of children: Not on file  . Years of education: Not on file  . Highest education level: Not on file  Social Needs  . Financial resource strain: Not on file  . Food insecurity - worry: Not on file  . Food insecurity - inability: Not on file  . Transportation needs - medical: Not on file  . Transportation needs - non-medical: Not on file  Occupational History  . Not on file  Tobacco Use  . Smoking status: Current Every Day Smoker    Packs/day: 1.00    Years: 28.00    Pack years: 28.00    Types: Cigarettes  . Smokeless tobacco: Never Used  Substance and Sexual Activity  . Alcohol use: No  . Drug use: No  . Sexual activity: Yes    Birth control/protection: None  Other Topics Concern  . Not on file  Social History Narrative  . Not on file    Review of Systems: Positive for: GI: Described in detail in HPI.    Gen:  fatigue, weakness, malaise,Denies any fever, chills, rigors, night sweats, anorexia,involuntary weight loss, and sleep disorder CV:  Denies chest pain, angina, palpitations, syncope, orthopnea, PND, peripheral edema, and claudication. Resp: Denies dyspnea, cough, sputum, wheezing, coughing up blood. GU : Denies urinary burning, blood in urine, urinary frequency, urinary hesitancy, nocturnal  urination, and urinary incontinence. MS: s/p 3 toes amputation , ulceration at prior amputation site, Denies muscle weakness, cramps, atrophy.  Derm: Denies rash, itching, oral ulcerations, hives, unhealing ulcers.  Psych: Denies depression, anxiety, memory loss, suicidal ideation, hallucinations,  and confusion. Heme: Denies bruising, bleeding, and enlarged lymph nodes. Neuro:  Denies any headaches, dizziness, paresthesias. Endo:  Has problems with DM, Denies any thyroid, adrenal function.  Physical Exam: Vital signs in last 24 hours: Temp:  [98 F (36.7 C)-98.8 F (37.1 C)] 98.4 F (36.9 C) (11/27 1346) Pulse Rate:  [86-87] 86 (11/27 1346) Resp:  [18] 18 (11/27 1346) BP: (148-171)/(86-101) 171/86 (11/27 1346) SpO2:  [100 %] 100 % (11/27 1346) Last BM Date: 09/05/17  General:   Alert,  Well-developed, well-nourished, pleasant and cooperative in NAD Head:  Normocephalic and atraumatic. Eyes:  Sclera clear, no icterus.  Mild pallor. Ears:  Normal auditory acuity. Nose:  No deformity, discharge,  or lesions. Mouth:  No deformity or lesions.  Oropharynx pink & moist. Neck:  Supple; no masses or thyromegaly. Lungs:  Clear throughout to auscultation.   No wheezes, crackles, or rhonchi. No acute distress. Heart:  Regular rate and rhythm; no murmurs, clicks, rubs,  or gallops. Extremities:  Left foot dressing Neurologic:  Alert and  oriented x4;  grossly normal neurologically. Skin:  Intact without significant lesions or rashes. Psych:  Alert and cooperative. Normal mood and affect. Abdomen:  Soft, nontender and nondistended. No masses, hepatosplenomegaly or hernias noted. Normal bowel sounds, without guarding, and without rebound.         Lab Results: Recent Labs    09/05/17 0620 09/06/17 0548  WBC 15.6* 11.7*  HGB 9.6* 9.4*  HCT 28.0* 27.9*  PLT 240 255   BMET Recent Labs    09/04/17 0716 09/05/17 0620 09/06/17 0548  NA 135 136 136  K 4.4 4.0 3.9  CL 108 110 107   CO2 21* 21* 23  GLUCOSE 156* 98 107*  BUN 15 12 9   CREATININE 1.35* 1.29* 1.20*  CALCIUM 8.3* 8.1* 8.1*   LFT Recent Labs    09/06/17 1128  PROT 6.3*  ALBUMIN 2.2*  AST 12*  ALT 7*  ALKPHOS 77  BILITOT 0.4  BILIDIR <0.1*  IBILI NOT CALCULATED   PT/INR Recent Labs    09/05/17 0620  LABPROT 16.3*  INR 1.32    Studies/Results: Dg Abd Acute W/chest  Result Date: 09/06/2017 CLINICAL DATA:  36 year old female with 4 days of mid abdominal pain, nausea vomiting. EXAM: DG ABDOMEN ACUTE W/ 1V CHEST COMPARISON:  Chest radiographs 02/21/2017. CT Abdomen and Pelvis 12/01/2012. FINDINGS: Seated AP view of the chest. The lungs are clear. No pneumothorax or pleural effusion. Normal cardiac size and mediastinal contours. Visualized tracheal air column is within normal limits. Supine and left-side-down lateral decubitus views of the abdomen. No pneumoperitoneum. Stable cholecystectomy clips. Non obstructed bowel gas pattern. Prominent gas in the redundant sigmoid colon, but no dilated upstream large or small bowel loops. Abdominal and pelvic visceral contours are within normal limits. No acute osseous abnormality identified. IMPRESSION: 1. No evidence of bowel obstruction.  No free air. 2. Negative chest. Electronically Signed   By: Genevie Ann M.D.   On: 09/06/2017 10:33    Impression: 1. Dysphagia 2. Normocytic anemia 3. Peripheral  vascular disease, stent in the left popliteal artery, on Plavix 4. Diabetes 5. History of bipolar disorder  Plan: I have reviewed patient's barium swallow. No swallowing dysfunction in the high cervical esophagus, small hiatal hernia noted. No obvious stricture, masses, ulceration noted.  Patient scheduled for aortogram and possible revascularization on 09/07/17.  Recommend advance diet as tolerated.  Differential diagnosis includes: Eosinophilic esophagitis vs malingering. Patient will need biopsies to be taken for which Plavix needs to be on hold for at  least for 5 days. It will not be possible inpatient, as patient is scheduled for revascularization tomorrow, and has a stent placed a few months ago.  Plan for an endoscopy as an outpatient.    LOS: 4 days   Ronnette Juniper, M.D.   09/06/2017, 3:31 PM  Pager [phone] If no answer or after 5 PM call [phone]

## 2017-09-06 NOTE — Progress Notes (Signed)
Patient is having nausea and vomiting not tolorate any food.Esmeralda Malay LPN

## 2017-09-06 NOTE — Progress Notes (Signed)
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 placement of VAC of the same toes 08/11/17 left leg pain and toe pain Dopplers = good waveform-palpable dorsalis pedis on the left with ischemia--was sent home not needing antibiotics and told to follow-up Came back to the emergency room 11/16 and 11/20 with foot opening up bleeding and returned ultimately 09/02/2017 with foul-smelling drainage from open wound and burning in the foot with T-max 100.7  BUN/creatinine on admission 20/1.3 lactic acid 0.8 WBC 12.9 Foot x-ray showed no lytic lesions suggestive of osteomyelitis Vascular surgeon consulted as below  Assessment & Plan:   Active Problems:   Type II diabetes mellitus with renal manifestations, uncontrolled (HCC)   Essential hypertension   Great toe pain, left   Surgical wound, non healing   Bipolar 1 disorder (HCC)   PVD, Ulcer and non-healing wound with ischemia Appreciate Vascular input Change abx to zosyn and vanc given n and intolerance 11/26 Daily soaks in soap and dry dressing changes  Arteriography as per Dr. Scot Dock post-poned for now--await resolution of swallowing issues prior to surgery Cont PLavix 75 daily and defer whether to hold to vascular surgeon Pain is mod controlled--cont IV meds  Nausea and vomit No resolution Increase reglan to 20 q8 and monitor Would order Esophagram but cannot keep down PO Asking GI to see in consult--might need endo  Smoker COPD Nicotine patch ordered Cont DUlera 2 puff bid, albuterol 19m q6 prn  DM  ty ii + neuropathy and nephropathy Last a1c 11.8 on 02/02/17 Metformin held on 11/25 given impending angiography Holding am lantsu 20, pm 30U and only use SSI Continue gabapentin 800 twice daily when able to take po  CKD ii Creat down from 1.47-->1.2 [baseline between 1.4 + 1.5] -Currently creatinine 1.2 Saline started 11/24 PM  Bipolar If can tolerate Cont Xanax 2 qid, Latuda 120 hs, ambien 5 daily--held trileptal 600 bid on admit but can resume 11/25  Gerd cont pepcid 20 bid and protonix 40 qd    Consultants:   vadcular  Procedures:    none  Antimicrobials:   Vanc, flagyl, caefepime    Subjective:  Nauseous still no vomit Some discomfort in chest--not been keeping down food still despite Reglan and other measures states vomits right after eating Has known h/o reflux No diarr No dark vomit nor stool  Objective: Vitals:   09/05/17 0538 09/05/17 1300 09/05/17 2134 09/06/17 0442  BP: 133/90 (!) 137/92 (!) 148/101 (!) 167/97  Pulse: 94 94 87 87  Resp: 16 18 18 18   Temp: 99.3 F (37.4 C) 99.1 F (37.3 C) 98.8 F (37.1 C) 98.2 F (36.8 C)  TempSrc: Oral Oral Oral Oral  SpO2: 96% 100% 100% 100%  Weight:      Height:        Intake/Output Summary (Last 24 hours) at 09/06/2017 1157 Last data filed at 09/05/2017 1900 Gross per 24 hour  Intake 1300 ml  Output -  Net 1300 ml   Filed Weights   09/02/17 1340 09/02/17 1952  Weight: 63.5 kg (140 lb) 63.5 kg (140 lb)    Examination:  Awake alert--not in any seeming  distress Chest clear no added sound abd soft slight tender epigastrium, not distended currently S1-S2 no murmur slightly tachycardic  EOMI NCAT Neuro intact Wound not seen today   Data Reviewed: I have personally reviewed following labs and imaging studies  CBC: Recent Labs  Lab 09/02/17 1212 09/03/17 0523 09/05/17 0620 09/06/17 0548  WBC 12.9* 11.0* 15.6* 11.7*  NEUTROABS 8.8*  --   --  8.6*  HGB 11.5* 10.1* 9.6* 9.4*  HCT 33.1*  29.4* 28.0* 27.9*  MCV 88.3 88.0 88.1 88.9  PLT 263 227 240 235   Basic Metabolic Panel: Recent Labs  Lab 09/02/17 1212 09/03/17 0523 09/04/17 0716 09/05/17 0620 09/06/17 0548  NA 135 135 135 136 136  K 4.4 4.0 4.4 4.0 3.9  CL 105 105 108 110 107  CO2 23 25 21* 21* 23  GLUCOSE 293* 126* 156* 98 107*  BUN 20 17 15 12 9   CREATININE 1.37* 1.26* 1.35* 1.29* 1.20*  CALCIUM 8.8* 8.3* 8.3* 8.1* 8.1*  PHOS  --   --  3.7 2.2*  --    GFR: Estimated Creatinine Clearance: 63.7 mL/min (A) (by C-G formula based on SCr of 1.2 mg/dL (H)). Liver Function Tests: Recent Labs  Lab 09/04/17 0716 09/05/17 0620  ALBUMIN 2.2* 2.1*   No results for input(s): LIPASE, AMYLASE in the last 168 hours. No results for input(s): AMMONIA in the last 168 hours. Coagulation Profile: Recent Labs  Lab 09/05/17 0620  INR 1.32   Cardiac Enzymes: No results for input(s): CKTOTAL, CKMB, CKMBINDEX, TROPONINI in the last 168 hours. BNP (last 3 results) No results for input(s): PROBNP in the last 8760 hours. HbA1C: No results for input(s): HGBA1C in the last 72 hours. CBG: Recent Labs  Lab 09/05/17 0814 09/05/17 1158 09/05/17 1641 09/05/17 2133 09/06/17 0741  GLUCAP 82 84 77 149* 139*   Lipid Profile: No results for input(s): CHOL, HDL, LDLCALC, TRIG, CHOLHDL, LDLDIRECT in the last 72 hours. Thyroid Function Tests: No results for input(s): TSH, T4TOTAL, FREET4, T3FREE, THYROIDAB in the last 72 hours. Anemia Panel: No results for input(s): VITAMINB12, FOLATE, FERRITIN, TIBC, IRON, RETICCTPCT in the last 72 hours. Urine analysis:    Component Value Date/Time   COLORURINE AMBER (A) 08/26/2017 1948   APPEARANCEUR CLOUDY (A) 08/26/2017 1948   LABSPEC 1.025 08/26/2017 1948   PHURINE 5.0 08/26/2017 1948   GLUCOSEU >=500 (A) 08/26/2017 1948   HGBUR SMALL (A) 08/26/2017 1948   BILIRUBINUR SMALL (A) 08/26/2017 1948   KETONESUR NEGATIVE 08/26/2017 1948   PROTEINUR 100 (A) 08/26/2017 1948    UROBILINOGEN 1.0 02/26/2015 1152   NITRITE NEGATIVE 08/26/2017 1948   LEUKOCYTESUR TRACE (A) 08/26/2017 1948     Radiology Studies: Reviewed images personally in health database    Scheduled Meds: . clopidogrel  75 mg Oral Q breakfast  . docusate sodium  100 mg Oral BID  . enoxaparin (LOVENOX) injection  40 mg Subcutaneous Q24H  . famotidine  20 mg Oral BID  . gabapentin  800 mg Oral BID  . insulin aspart  0-5 Units Subcutaneous QHS  . insulin aspart  0-9 Units Subcutaneous TID WC  . lurasidone  120 mg Oral QHS  . metoprolol tartrate  12.5 mg Oral BID  . mometasone-formoterol  2 puff Inhalation BID  . mupirocin ointment   Nasal Daily  . nicotine  14 mg Transdermal Daily  . OXcarbazepine  600 mg Oral BID  . [START ON 09/07/2017] pantoprazole  40 mg Oral Daily   Continuous Infusions: .  sodium chloride 100 mL/hr (09/06/17 0602)  . dextrose 5 % and 0.9% NaCl Stopped (09/05/17 0540)  . metoCLOPramide (REGLAN) injection    . piperacillin-tazobactam (ZOSYN)  IV Stopped (09/06/17 1019)  . vancomycin Stopped (09/05/17 1559)     LOS: 4 days    Time spent: [place], MD [facility]   If 7PM-7AM, please contact night-coverage [email or web address] Password [facility] 09/06/2017, 11:57 AM

## 2017-09-06 NOTE — Progress Notes (Addendum)
  Progress Note    09/06/2017 8:52 AM 1 Day Post-Op  Subjective:  Mild nausea this morning.  Burning pain in L GT.   Vitals:   09/05/17 2134 09/06/17 0442  BP: (!) 148/101 (!) 167/97  Pulse: 87 87  Resp: 18 18  Temp: 98.8 F (37.1 C) 98.2 F (36.8 C)  SpO2: 100% 100%   Physical Exam: Cardiac:  RRR Lungs:  Clear B lung fields Extremities:  No palpable L pedal pulses; palpable femoral pulses R>L; L lateral foot wound stable; L GT dusky and painful to touch Abdomen:  Soft Neurologic: A&O  CBC    Component Value Date/Time   WBC 11.7 (H) 09/06/2017 0548   RBC 3.14 (L) 09/06/2017 0548   HGB 9.4 (L) 09/06/2017 0548   HCT 27.9 (L) 09/06/2017 0548   PLT 255 09/06/2017 0548   MCV 88.9 09/06/2017 0548   MCH 29.9 09/06/2017 0548   MCHC 33.7 09/06/2017 0548   RDW 12.2 09/06/2017 0548   LYMPHSABS 1.8 09/06/2017 0548   MONOABS 0.8 09/06/2017 0548   EOSABS 0.5 09/06/2017 0548   BASOSABS 0.0 09/06/2017 0548    BMET    Component Value Date/Time   NA 136 09/06/2017 0548   K 3.9 09/06/2017 0548   CL 107 09/06/2017 0548   CO2 23 09/06/2017 0548   GLUCOSE 107 (H) 09/06/2017 0548   BUN 9 09/06/2017 0548   CREATININE 1.20 (H) 09/06/2017 0548   CALCIUM 8.1 (L) 09/06/2017 0548   GFRNONAA 57 (L) 09/06/2017 0548   GFRAA >60 09/06/2017 0548    INR    Component Value Date/Time   INR 1.32 09/05/2017 0620     Intake/Output Summary (Last 24 hours) at 09/06/2017 0852 Last data filed at 09/05/2017 1900 Gross per 24 hour  Intake 1300 ml  Output -  Net 1300 ml     Assessment/Plan:  36 y.o. female with L lateral foot wound with PAD  Continue plavix Plan is for aortogram with BLE runoff and possible revascularization by Dr. Donzetta Matters tomorrow 11/28 Patient is agreeable to this plan NPO midnight Encouraged smoking cessation Continue current wound care  DVT prophylaxis:  lovenox   Dagoberto Ligas, PA-C [facility] [phone] 09/06/2017 8:52 AM   I  have independently interviewed patient and agree with PA assessment and plan above.   Koltin Wehmeyer C. Donzetta Matters, MD [facility] [place] Office: [phone] Pager: [phone]

## 2017-09-06 NOTE — Progress Notes (Signed)
Nutrition Follow-up  DOCUMENTATION CODES:   Not applicable  INTERVENTION:   -30 ml Prostat BID, each supplement provides 100 kcals and 15 grams protein -MVI daily -Continue Magic Cup BID -Continue Hormel Shake BID  NUTRITION DIAGNOSIS:   Increased nutrient needs related to wound healing as evidenced by estimated needs.  Ongoing  GOAL:   Patient will meet greater than or equal to 90% of their needs  Progressing  MONITOR:   PO intake, Supplement acceptance, Skin  REASON FOR ASSESSMENT:   Consult Wound healing  ASSESSMENT:   Pt with PMH of bipolar affective d/o, depression, poorly controlled DM, HTN, hyperlipidemia, schizophrenia, PVD s/p toe amputations, and continues to smoke admitted with nonhealing left foot wound. No osteomyelitis on xray 11/23 but is being treated for infection and may need transmetatarsal amputation.   Per vascular surgery note, plan for aortogram with BLE run-off and possible revascularization on 11/28.   Pt sleeping soundly at time of visit. RD unable to arouse. Pt with poor oral intake due to nausea and difficulty swallowing. Per GI note, pt with dysphagia with solids, liquids, and pills. Barium swallow reveals no swallow dysfunction, but differential diagnosis include eosinophilic esophagitis (recommending biopsies).   Last Hgb A1c: 11.8 (02/02/17). PTA DM medications are 1500 mg Metformin BID, Lantus 20 units q AM and 30 units q PM, Novolog >300 mg/dL-15 units (<300 do not use).  Labs reviewed: CBGS: 77-149 (inpatient orders for glycemic control are 0-5 units insulin aspart q HS, 0-9 units insulin aspart TID with meals, 20 units insulin glargine daily, and insulin glargine 30 units q HS).   Diet Order:  Diet Carb Modified Fluid consistency: Thin; Room service appropriate? Yes Diet NPO time specified  EDUCATION NEEDS:   Education needs have been addressed  Skin:  Skin Assessment: Skin Integrity Issues: Skin Integrity Issues:: Diabetic  Ulcer Diabetic Ulcer: L foot  Last BM:  09/05/17  Height:   Ht Readings from Last 1 Encounters:  09/02/17 5\' 9"  (1.753 m)    Weight:   Wt Readings from Last 1 Encounters:  09/02/17 140 lb (63.5 kg)    Ideal Body Weight:  65.9 kg  BMI:  Body mass index is 20.67 kg/m.  Estimated Nutritional Needs:   Kcal:  1800-2000  Protein:  90-105 grams  Fluid:  2 L/day    Tareek Sabo A. Jimmye Norman, RD, LDN, CDE Pager: [phone] After hours Pager: [phone]

## 2017-09-07 ENCOUNTER — Encounter (HOSPITAL_COMMUNITY): Payer: Self-pay | Admitting: Vascular Surgery

## 2017-09-07 ENCOUNTER — Encounter (HOSPITAL_COMMUNITY)
Admission: EM | Disposition: A | Discharge status: Home / Self Care | Payer: Self-pay | Source: Home / Self Care | Attending: Family Medicine

## 2017-09-07 DIAGNOSIS — I739 Peripheral vascular disease, unspecified: Secondary | ICD-10-CM

## 2017-09-07 DIAGNOSIS — R112 Nausea with vomiting, unspecified: Secondary | ICD-10-CM

## 2017-09-07 DIAGNOSIS — E1129 Type 2 diabetes mellitus with other diabetic kidney complication: Secondary | ICD-10-CM

## 2017-09-07 DIAGNOSIS — E1165 Type 2 diabetes mellitus with hyperglycemia: Secondary | ICD-10-CM

## 2017-09-07 DIAGNOSIS — F319 Bipolar disorder, unspecified: Secondary | ICD-10-CM

## 2017-09-07 HISTORY — PX: PERIPHERAL VASCULAR INTERVENTION: CATH118257

## 2017-09-07 HISTORY — PX: ABDOMINAL AORTOGRAM: CATH118222

## 2017-09-07 HISTORY — PX: LOWER EXTREMITY ANGIOGRAPHY: CATH118251

## 2017-09-07 LAB — RENAL FUNCTION PANEL
ALBUMIN: 2.1 g/dL — AB (ref 3.5–5.0)
ANION GAP: 3 — AB (ref 5–15)
BUN: 6 mg/dL (ref 6–20)
CHLORIDE: 107 mmol/L (ref 101–111)
CO2: 25 mmol/L (ref 22–32)
Calcium: 7.9 mg/dL — ABNORMAL LOW (ref 8.9–10.3)
Creatinine, Ser: 1.13 mg/dL — ABNORMAL HIGH (ref 0.44–1.00)
Glucose, Bld: 151 mg/dL — ABNORMAL HIGH (ref 65–99)
PHOSPHORUS: 2.8 mg/dL (ref 2.5–4.6)
POTASSIUM: 3.9 mmol/L (ref 3.5–5.1)
Sodium: 135 mmol/L (ref 135–145)

## 2017-09-07 LAB — CBC
HEMATOCRIT: 25.5 % — AB (ref 36.0–46.0)
Hemoglobin: 8.7 g/dL — ABNORMAL LOW (ref 12.0–15.0)
MCH: 29.9 pg (ref 26.0–34.0)
MCHC: 34.1 g/dL (ref 30.0–36.0)
MCV: 87.6 fL (ref 78.0–100.0)
Platelets: 234 10*3/uL (ref 150–400)
RBC: 2.91 MIL/uL — AB (ref 3.87–5.11)
RDW: 12.1 % (ref 11.5–15.5)
WBC: 9 10*3/uL (ref 4.0–10.5)

## 2017-09-07 LAB — GLUCOSE, CAPILLARY
GLUCOSE-CAPILLARY: 223 mg/dL — AB (ref 65–99)
Glucose-Capillary: 120 mg/dL — ABNORMAL HIGH (ref 65–99)
Glucose-Capillary: 240 mg/dL — ABNORMAL HIGH (ref 65–99)

## 2017-09-07 LAB — CULTURE, BLOOD (ROUTINE X 2)
CULTURE: NO GROWTH
Culture: NO GROWTH
SPECIAL REQUESTS: ADEQUATE
Special Requests: ADEQUATE

## 2017-09-07 SURGERY — ABDOMINAL AORTOGRAM
Anesthesia: LOCAL

## 2017-09-07 MED ORDER — HEPARIN (PORCINE) IN NACL 2-0.9 UNIT/ML-% IJ SOLN
INTRAMUSCULAR | Status: AC
  Filled 2017-09-07: qty 1000

## 2017-09-07 MED ORDER — FENTANYL CITRATE (PF) 100 MCG/2ML IJ SOLN
INTRAMUSCULAR | Status: AC
  Filled 2017-09-07: qty 2

## 2017-09-07 MED ORDER — MIDAZOLAM HCL 2 MG/2ML IJ SOLN
INTRAMUSCULAR | Status: DC | PRN
  Administered 2017-09-07: 2 mg via INTRAVENOUS
  Administered 2017-09-07: 0.5 mg via INTRAVENOUS
  Administered 2017-09-07: 2 mg via INTRAVENOUS

## 2017-09-07 MED ORDER — SODIUM CHLORIDE 0.9% FLUSH
3.0000 mL | Freq: Two times a day (BID) | INTRAVENOUS | Status: DC
  Administered 2017-09-07 (×2): 3 mL via INTRAVENOUS

## 2017-09-07 MED ORDER — MIDAZOLAM HCL 2 MG/2ML IJ SOLN
INTRAMUSCULAR | Status: AC
  Filled 2017-09-07: qty 2

## 2017-09-07 MED ORDER — SODIUM CHLORIDE 0.9 % WEIGHT BASED INFUSION
1.0000 mL/kg/h | INTRAVENOUS | Status: AC
  Administered 2017-09-07: 1 mL/kg/h via INTRAVENOUS

## 2017-09-07 MED ORDER — LIDOCAINE HCL (PF) 1 % IJ SOLN
INTRAMUSCULAR | Status: AC
  Filled 2017-09-07: qty 30

## 2017-09-07 MED ORDER — ANGIOPLASTY BOOK
Freq: Once | Status: AC
  Administered 2017-09-07: 23:00:00 1
  Filled 2017-09-07: qty 1

## 2017-09-07 MED ORDER — HEPARIN (PORCINE) IN NACL 2-0.9 UNIT/ML-% IJ SOLN
INTRAMUSCULAR | Status: AC | PRN
  Administered 2017-09-07: 1000 mL

## 2017-09-07 MED ORDER — LIDOCAINE HCL (PF) 1 % IJ SOLN
INTRAMUSCULAR | Status: DC | PRN
  Administered 2017-09-07: 20 mL

## 2017-09-07 MED ORDER — HEPARIN SODIUM (PORCINE) 1000 UNIT/ML IJ SOLN
INTRAMUSCULAR | Status: DC | PRN
  Administered 2017-09-07: 7000 [IU] via INTRAVENOUS

## 2017-09-07 MED ORDER — HEPARIN SODIUM (PORCINE) 1000 UNIT/ML IJ SOLN
INTRAMUSCULAR | Status: AC
  Filled 2017-09-07: qty 1

## 2017-09-07 MED ORDER — IODIXANOL 320 MG/ML IV SOLN
INTRAVENOUS | Status: DC | PRN
Start: 2017-09-07 — End: 2017-09-07
  Administered 2017-09-07: 125 mL via INTRAVENOUS

## 2017-09-07 MED ORDER — SODIUM CHLORIDE 0.9% FLUSH: 3.0000 mL | INTRAVENOUS | Status: DC | PRN

## 2017-09-07 MED ORDER — LABETALOL HCL 5 MG/ML IV SOLN: 10.0000 mg | INTRAVENOUS | Status: DC | PRN

## 2017-09-07 MED ORDER — SODIUM CHLORIDE 0.9 % IV SOLN
250.0000 mL | INTRAVENOUS | Status: DC | PRN
Start: 2017-09-07 — End: 2017-09-08

## 2017-09-07 MED ORDER — ONDANSETRON HCL 4 MG/2ML IJ SOLN: 4.0000 mg | Freq: Four times a day (QID) | INTRAMUSCULAR | Status: DC | PRN

## 2017-09-07 MED ORDER — HYDRALAZINE HCL 20 MG/ML IJ SOLN: 5.0000 mg | INTRAMUSCULAR | Status: DC | PRN

## 2017-09-07 MED ORDER — CLOPIDOGREL BISULFATE 75 MG PO TABS
75.0000 mg | ORAL_TABLET | Freq: Every day | ORAL | Status: DC
  Administered 2017-09-08: 08:00:00 75 mg via ORAL
  Filled 2017-09-07 (×2): qty 1

## 2017-09-07 MED ORDER — FENTANYL CITRATE (PF) 100 MCG/2ML IJ SOLN
INTRAMUSCULAR | Status: DC | PRN
  Administered 2017-09-07 (×2): 100 ug via INTRAVENOUS

## 2017-09-07 MED ORDER — CLOPIDOGREL BISULFATE 75 MG PO TABS
300.0000 mg | ORAL_TABLET | Freq: Once | ORAL | Status: AC
  Administered 2017-09-07: 15:00:00 300 mg via ORAL
  Filled 2017-09-07: qty 4

## 2017-09-07 SURGICAL SUPPLY — 19 items
BALLN STERLING OTW 5X100X135 (BALLOONS) ×4
BALLOON STERLING OTW 5X100X135 (BALLOONS) ×3 IMPLANT
CATH OMNI FLUSH 5F 65CM (CATHETERS) ×4 IMPLANT
CATH QUICKCROSS .018X135CM (MICROCATHETER) ×4 IMPLANT
COVER PRB 48X5XTLSCP FOLD TPE (BAG) ×3 IMPLANT
COVER PROBE 5X48 (BAG) ×4
DEVICE CLOSURE MYNXGRIP 6/7F (Vascular Products) ×4 IMPLANT
KIT ENCORE 26 ADVANTAGE (KITS) ×4 IMPLANT
KIT MICROINTRODUCER STIFF 5F (SHEATH) ×4 IMPLANT
KIT PV (KITS) ×4 IMPLANT
SHEATH HIGHFLEX ANSEL 6FRX55 (SHEATH) ×4 IMPLANT
SHEATH PINNACLE 5F 10CM (SHEATH) ×4 IMPLANT
SHEATH PINNACLE 6F 10CM (SHEATH) ×8 IMPLANT
STENT SUPERA 5.0X100X120 (Permanent Stent) ×4 IMPLANT
SYR MEDRAD MARK V 150ML (SYRINGE) ×4 IMPLANT
TRANSDUCER W/STOPCOCK (MISCELLANEOUS) ×4 IMPLANT
TRAY PV CATH (CUSTOM PROCEDURE TRAY) ×4 IMPLANT
WIRE BENTSON .035X145CM (WIRE) ×4 IMPLANT
WIRE G V18X300CM (WIRE) ×4 IMPLANT

## 2017-09-07 NOTE — Progress Notes (Signed)
  Progress Note    09/07/2017 10:17 AM 2 Days Post-Op  Subjective:  Continues to be nauseated  Vitals:   09/07/17 0419 09/07/17 0901  BP: 124/82 (!) 144/87  Pulse: 79 82  Resp: 18   Temp: 98.2 F (36.8 C) 98.4 F (36.9 C)  SpO2: 100%     Physical Exam: Cardiac:  RRR Lungs:  Clear B lung fields Extremities:  No palpable L pedal pulses; palpable femoral pulses R>L; L lateral foot wound stable; L GT dusky and painful to touch Abdomen:  Soft Neurologic: A&O    CBC    Component Value Date/Time   WBC 9.0 09/07/2017 0516   RBC 2.91 (L) 09/07/2017 0516   HGB 8.7 (L) 09/07/2017 0516   HCT 25.5 (L) 09/07/2017 0516   PLT 234 09/07/2017 0516   MCV 87.6 09/07/2017 0516   MCH 29.9 09/07/2017 0516   MCHC 34.1 09/07/2017 0516   RDW 12.1 09/07/2017 0516   LYMPHSABS 1.8 09/06/2017 0548   MONOABS 0.8 09/06/2017 0548   EOSABS 0.5 09/06/2017 0548   BASOSABS 0.0 09/06/2017 0548    BMET    Component Value Date/Time   NA 135 09/07/2017 0516   K 3.9 09/07/2017 0516   CL 107 09/07/2017 0516   CO2 25 09/07/2017 0516   GLUCOSE 151 (H) 09/07/2017 0516   BUN 6 09/07/2017 0516   CREATININE 1.13 (H) 09/07/2017 0516   CALCIUM 7.9 (L) 09/07/2017 0516   GFRNONAA >60 09/07/2017 0516   GFRAA >60 09/07/2017 0516    INR    Component Value Date/Time   INR 1.32 09/05/2017 0620     Intake/Output Summary (Last 24 hours) at 09/07/2017 1017 Last data filed at 09/07/2017 0900 Gross per 24 hour  Intake 852 ml  Output 0 ml  Net 852 ml     Assessment/Plan:  36 y.o. female with L lateral foot wound with PAD  Continue plavix Aortogram with left runoff and possible intervention today.    Brandon C. Donzetta Matters, MD [facility] Office: [phone] Pager: [phone]  09/07/2017 10:17 AM

## 2017-09-07 NOTE — Progress Notes (Signed)
[facility] PROGRESS NOTE  Kathy Phelps ZWC:[id number] DOB: [birth year] DOA: 09/02/2017  PCP: Rubbie Battiest, NP  Brief History/Interval Summary: 36 year old female with a past medical history of hypertension, hyperlipidemia, acid reflux, peripheral artery disease, tobacco abuse presented with complaints of foul-smelling drainage from her open wound.  Patient was hospitalized for further management.  Reason for Visit: Peripheral vascular disease  Consultants: Vascular surgery.  Gastroenterology  Procedures: Arteriogram plan for today  Antibiotics: Vancomycin and Zosyn  Subjective/Interval History: Patient is waiting on her procedure.  Complains of some pain in her foot.  Also anxious to go home.  ROS: Denies any chest pain shortness of breath.  Continues to have some nausea.  Objective:  Vital Signs  Vitals:   09/07/17 1215 09/07/17 1215 09/07/17 1220 09/07/17 1224  BP:  (!) 156/112 (!) 173/112 (!) 165/103  Pulse: 84 77 88 83  Resp: (!) 7 (!) 136 (!) 62 11  Temp:      TempSrc:      SpO2: 100% 100% 100% 100%  Weight:      Height:        Intake/Output Summary (Last 24 hours) at 09/07/2017 1310 Last data filed at 09/07/2017 0900 Gross per 24 hour  Intake 852 ml  Output 0 ml  Net 852 ml   Filed Weights   09/02/17 1340 09/02/17 1952  Weight: 63.5 kg (140 lb) 63.5 kg (140 lb)    General appearance: alert, cooperative, appears stated age and no distress Head: Normocephalic, without obvious abnormality, atraumatic Resp: clear to auscultation bilaterally Cardio: regular rate and rhythm, S1, S2 normal, no murmur, click, rub or gallop GI: soft, non-tender; bowel sounds normal; no masses,  no organomegaly Extremities: Left foot covered in a dressing. Neurologic: No focal deficits  Lab Results:  Data Reviewed: I have personally reviewed following labs and imaging studies  CBC: Recent Labs  Lab 09/02/17 1212 09/03/17 0523 09/05/17 0620  09/06/17 0548 09/07/17 0516  WBC 12.9* 11.0* 15.6* 11.7* 9.0  NEUTROABS 8.8*  --   --  8.6*  --   HGB 11.5* 10.1* 9.6* 9.4* 8.7*  HCT 33.1* 29.4* 28.0* 27.9* 25.5*  MCV 88.3 88.0 88.1 88.9 87.6  PLT 263 227 240 255 235    Basic Metabolic Panel: Recent Labs  Lab 09/03/17 0523 09/04/17 0716 09/05/17 0620 09/06/17 0548 09/07/17 0516  NA 135 135 136 136 135  K 4.0 4.4 4.0 3.9 3.9  CL 105 108 110 107 107  CO2 25 21* 21* 23 25  GLUCOSE 126* 156* 98 107* 151*  BUN 17 15 12 9 6   CREATININE 1.26* 1.35* 1.29* 1.20* 1.13*  CALCIUM 8.3* 8.3* 8.1* 8.1* 7.9*  PHOS  --  3.7 2.2*  --  2.8    GFR: Estimated Creatinine Clearance: 67.7 mL/min (A) (by C-G formula based on SCr of 1.13 mg/dL (H)).  Liver Function Tests: Recent Labs  Lab 09/04/17 0716 09/05/17 0620 09/06/17 1128 09/07/17 0516  AST  --   --  12*  --   ALT  --   --  7*  --   ALKPHOS  --   --  77  --   BILITOT  --   --  0.4  --   PROT  --   --  6.3*  --   ALBUMIN 2.2* 2.1* 2.2* 2.1*    Recent Labs  Lab 09/06/17 1128  LIPASE 18    Coagulation Profile: Recent Labs  Lab 09/05/17 0620  INR 1.32  CBG: Recent Labs  Lab 09/06/17 0741 09/06/17 1247 09/06/17 1705 09/06/17 2020 09/07/17 0755  GLUCAP 139* 90 105* 161* 120*     Recent Results (from the past 240 hour(s))  Blood culture (routine x 2)     Status: None (Preliminary result)   Collection Time: 09/02/17 12:29 PM  Result Value Ref Range Status   Specimen Description BLOOD LEFT ANTECUBITAL  Final   Special Requests   Final    BOTTLES DRAWN AEROBIC AND ANAEROBIC Blood Culture adequate volume   Culture   Final    NO GROWTH 4 DAYS Performed at [facility], [place] [address] [place], [place] [address]    Report Status PENDING  Incomplete  Blood culture (routine x 2)     Status: None (Preliminary result)   Collection Time: 09/02/17 12:50 PM  Result Value Ref Range Status   Specimen Description BLOOD RIGHT ANTECUBITAL  Final   Special  Requests   Final    BOTTLES DRAWN AEROBIC AND ANAEROBIC Blood Culture adequate volume   Culture   Final    NO GROWTH 4 DAYS Performed at [facility], [place] [address] [place], [place] [address]    Report Status PENDING  Incomplete      Radiology Studies: Dg Esophagus  Result Date: 09/06/2017 CLINICAL DATA:  Persistent regurgitation. Gastroesophageal reflux disease. EXAM: ESOPHOGRAM/BARIUM SWALLOW TECHNIQUE: Single contrast examination was performed using  thin barium. FLUOROSCOPY TIME:  Fluoroscopy Time:  1 minutes Radiation Exposure Index (if provided by the fluoroscopic device): 8.3 mGy Number of Acquired Spot Images: 6 COMPARISON:  Chest radiograph 09/06/2017 FINDINGS: Exam is suboptimal in the patient could only drink small quantity of the thin barium for fear of regurgitation. No the swallowing dysfunction in the high cervical esophagus. No stricture or mass evident within the esophagus. Small hiatal hernia is noted. No esophageal mucosal abnormality identified. IMPRESSION: 1. No gross abnormality in esophagus on limited study. No stricture or mass. 2. Small hiatal hernia. Electronically Signed   By: Suzy Bouchard M.D.   On: 09/06/2017 15:31   Dg Abd Acute W/chest  Result Date: 09/06/2017 CLINICAL DATA:  36 year old female with 4 days of mid abdominal pain, nausea vomiting. EXAM: DG ABDOMEN ACUTE W/ 1V CHEST COMPARISON:  Chest radiographs 02/21/2017. CT Abdomen and Pelvis 12/01/2012. FINDINGS: Seated AP view of the chest. The lungs are clear. No pneumothorax or pleural effusion. Normal cardiac size and mediastinal contours. Visualized tracheal air column is within normal limits. Supine and left-side-down lateral decubitus views of the abdomen. No pneumoperitoneum. Stable cholecystectomy clips. Non obstructed bowel gas pattern. Prominent gas in the redundant sigmoid colon, but no dilated upstream large or small bowel loops. Abdominal and pelvic visceral contours are within normal  limits. No acute osseous abnormality identified. IMPRESSION: 1. No evidence of bowel obstruction.  No free air. 2. Negative chest. Electronically Signed   By: Genevie Ann M.D.   On: 09/06/2017 10:33     Medications:  Scheduled: . [MAR Hold] clopidogrel  75 mg Oral Q breakfast  . [MAR Hold] enoxaparin (LOVENOX) injection  40 mg Subcutaneous Q24H  . [MAR Hold] famotidine  20 mg Oral BID  . [MAR Hold] feeding supplement (PRO-STAT SUGAR FREE 64)  30 mL Oral BID  . [MAR Hold] gabapentin  800 mg Oral BID  . [MAR Hold] insulin aspart  0-5 Units Subcutaneous QHS  . [MAR Hold] insulin aspart  0-9 Units Subcutaneous TID WC  . [MAR Hold] lurasidone  120 mg Oral  QHS  . [MAR Hold] metoprolol tartrate  12.5 mg Oral BID  . [MAR Hold] mometasone-formoterol  2 puff Inhalation BID  . [MAR Hold] multivitamin with minerals  1 tablet Oral Daily  . [MAR Hold] mupirocin ointment   Nasal Daily  . [MAR Hold] nicotine  14 mg Transdermal Daily  . [MAR Hold] OXcarbazepine  600 mg Oral BID  . [MAR Hold] pantoprazole  40 mg Oral Daily   Continuous: . dextrose 5 % and 0.9% NaCl Stopped (09/05/17 0540)  . [MAR Hold] metoCLOPramide (REGLAN) injection Stopped (09/07/17 0554)  . [MAR Hold] piperacillin-tazobactam (ZOSYN)  IV 3.375 g (09/07/17 0527)  . [MAR Hold] vancomycin Stopped (09/06/17 1518)   PRN:[MAR Hold] albuterol, [MAR Hold] alprazolam, [MAR Hold]  HYDROmorphone (DILAUDID) injection, [MAR Hold] ondansetron **OR** [MAR Hold] ondansetron (ZOFRAN) IV, [MAR Hold] oxyCODONE-acetaminophen, [MAR Hold] promethazine, [MAR Hold] sorbitol, [MAR Hold] zolpidem  Assessment/Plan:  Active Problems:   Type II diabetes mellitus with renal manifestations, uncontrolled (HCC)   Essential hypertension   Great toe pain, left   Surgical wound, non healing   Bipolar 1 disorder ([place])    Peripheral vascular disease with nonhealing wound in the left foot Vascular surgery is following.  Plan is for arteriogram today.  Continue  Plavix.  Continue vancomycin and Zosyn.  Cultures are all negative so far.  Will consider de-escalating antibiotics tomorrow.  Nausea and vomiting Patient seen by gastroenterology.  Esophagogram was done which did not show any strictures.  Since the patient has to be on Plavix, gastroenterology did not want to do any endoscopy due to the need to do a biopsy.  Could she have diabetic gastroparesis?  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  Metformin is on hold.  She is on Lantus at home.  Currently is on hold.  Continue SSI.  Monitor CBGs.  Normocytic anemia No evidence for overt bleeding.  Continue to monitor hemoglobin.  Chronic kidney disease stage II Creatinine appears to be at baseline.  Monitor urine output.  Tobacco abuse Nicotine patch.  Patient counseled.  History of COPD Stable continue home medications.  History of bipolar disorder Continue home medications.  History of GERD Continue PPI.   DVT Prophylaxis: Lovenox    Code Status: Full code Family Communication: Discussed with the patient Disposition Plan: Management as outlined above.    LOS: 5 days   [facility] Pager [phone] 09/07/2017, 1:10 PM  If 7PM-7AM, please contact night-coverage at [email or web address], password [facility]

## 2017-09-07 NOTE — Op Note (Signed)
    Patient name: Kathy Phelps MRN: [id number] DOB: [birth year] Sex: female  09/07/2017 Pre-operative Diagnosis: critical left lower extremity ischemia with wound Post-operative diagnosis:  Same Surgeon:  Erlene Quan C. Donzetta Matters, MD Procedure Performed: 1.  US guided cannulation of right common femoral artery 2.  Aortogram with bilateral lower extremity runoff 3.  Stent of left popliteal with 43m supera 4.  mynx closure of right common femoral artery 5.  Moderate sedation with fentanyl and versed for 73 minutes  Indications: 36year old female with history of left foot wound that previously healed and has now resurfaced after having 3 toes amputated.  She has an ABI that is near normal but she also has noncompressible diabetic vessels.  She is indicated for aortogram possible intervention on the left.  Findings: Aorta and iliac segments were narrowed but were free of disease.  The right side lower extremity was incompletely visualized but appeared to have normal runoff to the tibials at least.  On the left there was recurrent disease at the area of the distal SFA proximal popliteal artery.  This was approximately 70% stenosed and was flow-limiting.  Following stenting there was 0% residual stenosis there was a proximal dissection which sealed with balloon angioplasty proximally.   Procedure:  The patient was identified in the holding area and taken to room 8.  The patient was then placed supine on the table and prepped and draped in the usual sterile fashion.  A time out was called.  Ultrasound was used to evaluate the right common femoral artery.  It was patent .  A digital ultrasound image was acquired.  A micropuncture needle was used to access the right common femoral artery under ultrasound guidance.  An 018 wire was advanced without resistance and a micropuncture sheath was placed.  The 018 wire was removed and a benson wire was placed.  The micropuncture sheath was exchanged for a 5 french sheath.   An omniflush catheter was advanced over the wire to the level of L-1.  An abdominal angiogram was obtained followed by bilateral lower extremity runoff.  With the findings above we elected to cross the bifurcation with Bentson wire and Omni catheter and exchanged for a long 6 French sheath and the patient was heparinized.  Following this we used V 18 wire quick cross catheter across the stenosed areas of the SFA and popliteal arteries and confirmed intraluminal access distally.  We then predilated the entire area with a 5 mm balloon inflated to 12 atm putting at about 5.3 mm.  Following this we deployed a superior and 5 mm x 100 mm stent deployed to a total of 89 mm length.  This was post dilated and completion angiogram did demonstrate slight proximal dissection which was then dilated with a 5 mm balloon to 4 atm for a total of 3 minutes.  Completion angiogram demonstrated sealing of the dissection.  Completion angiogram demonstrated runoff dominant via the anterior tibial artery filling the foot.  Satisfied with this we then exchanged over a Bentson wire for a short 6 French sheath and the Bentson was removed.  We then brought a minx to the field and deployed this pressure was held and the groin was hemostatic.   patient tolerated procedure well without immediate complication.   Contrast: 125cc  Malli Falotico C. CDonzetta Matters MD [facility] [place]: [phone]: [phone]

## 2017-09-07 NOTE — Progress Notes (Signed)
Inpatient Diabetes Program Recommendations  AACE/ADA: New Consensus Statement on Inpatient Glycemic Control (2015)  Target Ranges:  Prepandial:   less than 140 mg/dL      Peak postprandial:   less than 180 mg/dL (1-2 hours)      Critically ill patients:  140 - 180 mg/dL   Lab Results  Component Value Date   GLUCAP 120 (H) 09/07/2017   HGBA1C 11.8 (H) 02/02/2017    Review of Glycemic ControlResults for Kathy Phelps, Kathy Phelps (MRN [id number]) as of 09/07/2017 13:09  Ref. Range 09/06/2017 07:41 09/06/2017 12:47 09/06/2017 17:05 09/06/2017 20:20 09/07/2017 07:55  Glucose-Capillary Latest Ref Range: 65 - 99 mg/dL 139 (H) 90 105 (H) 161 (H) 120 (H)   Diabetes history: Type 2 DM Outpatient Diabetes medications:  Lantus 20 units in the AM and 30 units q PM, Metformin 1000 mg bid, Novolog 0-30 units tid with meals Current orders for Inpatient glycemic control:  Novolog sensitive tid with meals and HS  Inpatient Diabetes Program Recommendations:   Consider restarting Lantus 20 units q HS after procedure.   Thanks Adah Perl, RN, BC-ADM Inpatient Diabetes Coordinator Pager [phone] (8a-5p)

## 2017-09-08 LAB — CBC
HEMATOCRIT: 24.8 % — AB (ref 36.0–46.0)
Hemoglobin: 8.5 g/dL — ABNORMAL LOW (ref 12.0–15.0)
MCH: 29.9 pg (ref 26.0–34.0)
MCHC: 34.3 g/dL (ref 30.0–36.0)
MCV: 87.3 fL (ref 78.0–100.0)
PLATELETS: 237 10*3/uL (ref 150–400)
RBC: 2.84 MIL/uL — AB (ref 3.87–5.11)
RDW: 12.2 % (ref 11.5–15.5)
WBC: 8.6 10*3/uL (ref 4.0–10.5)

## 2017-09-08 LAB — GLUCOSE, CAPILLARY
GLUCOSE-CAPILLARY: 107 mg/dL — AB (ref 65–99)
Glucose-Capillary: 236 mg/dL — ABNORMAL HIGH (ref 65–99)

## 2017-09-08 LAB — BASIC METABOLIC PANEL
ANION GAP: 6 (ref 5–15)
BUN: 5 mg/dL — AB (ref 6–20)
CO2: 22 mmol/L (ref 22–32)
Calcium: 7.5 mg/dL — ABNORMAL LOW (ref 8.9–10.3)
Chloride: 107 mmol/L (ref 101–111)
Creatinine, Ser: 1.15 mg/dL — ABNORMAL HIGH (ref 0.44–1.00)
GFR calc Af Amer: >60 mL/min (ref >60)
GFR calc non Af Amer: 60 mL/min — ABNORMAL LOW (ref >60)
GLUCOSE: 270 mg/dL — AB (ref 65–99)
POTASSIUM: 3.4 mmol/L — AB (ref 3.5–5.1)
Sodium: 135 mmol/L (ref 135–145)

## 2017-09-08 MED ORDER — OXYCODONE-ACETAMINOPHEN 5-325 MG PO TABS: 1.0000 | ORAL_TABLET | Freq: Three times a day (TID) | ORAL | 0 refills | Status: DC | PRN

## 2017-09-08 MED ORDER — METOCLOPRAMIDE HCL 5 MG PO TABS: 5.0000 mg | ORAL_TABLET | Freq: Three times a day (TID) | ORAL | 0 refills | Status: DC

## 2017-09-08 MED ORDER — CEPHALEXIN 500 MG PO CAPS: 500.0000 mg | ORAL_CAPSULE | Freq: Two times a day (BID) | ORAL | 0 refills | Status: DC

## 2017-09-08 MED ORDER — COLLAGENASE 250 UNIT/GM EX OINT: 1.0000 "application " | TOPICAL_OINTMENT | Freq: Every day | CUTANEOUS | 0 refills | Status: DC

## 2017-09-08 MED ORDER — CLOPIDOGREL BISULFATE 75 MG PO TABS: 75.0000 mg | ORAL_TABLET | Freq: Every day | ORAL | 3 refills | Status: DC

## 2017-09-08 MED ORDER — POTASSIUM CHLORIDE CRYS ER 20 MEQ PO TBCR
20.0000 meq | EXTENDED_RELEASE_TABLET | Freq: Once | ORAL | Status: AC
  Administered 2017-09-08: 08:00:00 20 meq via ORAL
  Filled 2017-09-08: qty 1

## 2017-09-08 MED ORDER — INSULIN GLARGINE 100 UNIT/ML ~~LOC~~ SOLN
20.0000 [IU] | Freq: Every day | SUBCUTANEOUS | Status: DC
  Administered 2017-09-08: 10:00:00 20 [IU] via SUBCUTANEOUS
  Filled 2017-09-08: qty 0.2

## 2017-09-08 NOTE — Discharge Instructions (Signed)
° °  [facility]  Discharge Instructions  Lower Extremity Angiogram; Angioplasty/Stenting  Please refer to the following instructions for your post-procedure care. Your surgeon or physician assistant will discuss any changes with you.  Activity  Avoid lifting more than 8 pounds (1 gallons of milk) for 72 hours (3 days) after your procedure. You may walk as much as you can tolerate. It's OK to drive after 72 hours.  Bathing/Showering  You may shower the day after your procedure. If you have a bandage, you may remove it at 24- 48 hours. Clean your incision site with mild soap and water. Pat the area dry with a clean towel.  Continue dressing changes as you were doing prior to hospitalization.  Diet  Resume your pre-procedure diet. There are no special food restrictions following this procedure. All patients with peripheral vascular disease should follow a low fat/low cholesterol diet. In order to heal from your surgery, it is CRITICAL to get adequate nutrition. Your body requires vitamins, minerals, and protein. Vegetables are the best source of vitamins and minerals. Vegetables also provide the perfect balance of protein. Processed food has little nutritional value, so try to avoid this.  Medications  Resume taking all of your medications unless your doctor tells you not to. If your incision is causing pain, you may take over-the-counter pain relievers such as acetaminophen (Tylenol)  Follow Up  Follow up will be arranged at the time of your procedure. You may have an office visit scheduled or may be scheduled for surgery. Ask your surgeon if you have any questions.  Please call us immediately for any of the following conditions: Severe or worsening pain your legs or feet at rest or with walking. Increased pain, redness, drainage at your groin puncture site. Fever of 101 degrees or higher. If you have any mild or slow bleeding from your puncture  site: lie down, apply firm constant pressure over the area with a piece of gauze or a clean wash cloth for 30 minutes- no peeking!, call 911 right away if you are still bleeding after 30 minutes, or if the bleeding is heavy and unmanageable.  Reduce your risk factors of vascular disease:  Stop smoking. If you would like help call [facility] at 1-800-QUIT-NOW 726-577-7788) or [place] at ([phone]. Manage your cholesterol Maintain a desired weight Control your diabetes Keep your blood pressure down  If you have any questions, please call the office at [phone]

## 2017-09-08 NOTE — Discharge Summary (Signed)
[facility]  Physician Discharge Summary   Patient ID: Kathy Phelps MRN: [id number] DOB/AGE: September 08, 1981 36 y.o.  Admit date: 09/02/2017 Discharge date: 09/08/2017  PCP: Rubbie Battiest, NP  DISCHARGE DIAGNOSES:  Active Problems:   Type II diabetes mellitus with renal manifestations, uncontrolled ([place])   Essential hypertension   Great toe pain, left   Surgical wound, non healing   Bipolar 1 disorder (HCC)   RECOMMENDATIONS FOR OUTPATIENT FOLLOW UP: 1. Outpatient follow-up with vascular surgery   DISCHARGE CONDITION: fair  Diet recommendation: Modified carbohydrate  Filed Weights   09/02/17 1340 09/02/17 1952 09/08/17 0505  Weight: 63.5 kg (140 lb) 63.5 kg (140 lb) 63 kg (138 lb 14.2 oz)    INITIAL HISTORY: 36 year old female with a past medical history of hypertension, hyperlipidemia, acid reflux, peripheral artery disease, tobacco abuse presented with complaints of foul-smelling drainage from her open wound.  Patient was hospitalized for further management.  Consultants: Vascular surgery.  Gastroenterology  Procedures:  LE Arteriogram 1.  US guided cannulation of right common femoral artery 2.  Aortogram with bilateral lower extremity runoff 3.  Stent of left popliteal with 29mm supera 4.  mynx closure of right common femoral artery 5.  Moderate sedation with fentanyl and versed for 73 minutes   HOSPITAL COURSE:   Peripheral vascular disease with nonhealing wound in the left foot Patient was seen by vascular surgery.  She underwent arteriogram which showed occlusion of the left popliteal artery.  A stent was placed.  Patient to continue Plavix.  Patient was also started on vancomycin and Zosyn.  Cultures were negative.  Vascular surgery to discharge her on oral antibiotic.  They will arrange outpatient follow-up.  Nausea and vomiting Patient seen by gastroenterology.  Esophagogram was done which did not show any strictures.  Since the patient  has to be on Plavix, gastroenterology did not want to do any endoscopy due to the need to do a biopsy.    Diabetes gastroparesis is another possibility.  She was initially placed on intravenous Reglan.  We will give her a prescription for oral Reglan.  Symptoms appear to have improved.    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    She may continue home medication regimen.  Follow-up with outpatient providers.  Normocytic anemia No evidence for overt bleeding.  Hemoglobin remains stable.  Chronic kidney disease stage II Creatinine appears to be at baseline.    She was given a dose of potassium prior to discharge.  Tobacco abuse Nicotine patch.  Patient counseled.  History of COPD Stable. Continue home medications.  History of bipolar disorder Continue home medications.  History of GERD Continue PPI.  Overall stable.  Patient wants to go home.  Discussed with vascular surgery.  Cleared by them for discharge.    PERTINENT LABS:  The results of significant diagnostics from this hospitalization (including imaging, microbiology, ancillary and laboratory) are listed below for reference.    Microbiology: Recent Results (from the past 240 hour(s))  Blood culture (routine x 2)     Status: None   Collection Time: 09/02/17 12:29 PM  Result Value Ref Range Status   Specimen Description BLOOD LEFT ANTECUBITAL  Final   Special Requests   Final    BOTTLES DRAWN AEROBIC AND ANAEROBIC Blood Culture adequate volume   Culture   Final    NO GROWTH 5 DAYS Performed at [facility], [address], [place], [place] [address]    Report Status  09/07/2017 FINAL  Final  Blood culture (routine x 2)     Status: None   Collection Time: 09/02/17 12:50 PM  Result Value Ref Range Status   Specimen Description BLOOD RIGHT ANTECUBITAL  Final   Special Requests   Final    BOTTLES DRAWN AEROBIC AND ANAEROBIC Blood Culture adequate volume   Culture   Final    NO  GROWTH 5 DAYS Performed at [facility], [address], [place], [place] [address]    Report Status 09/07/2017 FINAL  Final     Labs: Basic Metabolic Panel: Recent Labs  Lab 09/04/17 0716 09/05/17 0620 09/06/17 0548 09/07/17 0516 09/08/17 0208  NA 135 136 136 135 135  K 4.4 4.0 3.9 3.9 3.4*  CL 108 110 107 107 107  CO2 21* 21* 23 25 22   GLUCOSE 156* 98 107* 151* 270*  BUN 15 12 9 6  5*  CREATININE 1.35* 1.29* 1.20* 1.13* 1.15*  CALCIUM 8.3* 8.1* 8.1* 7.9* 7.5*  PHOS 3.7 2.2*  --  2.8  --    Liver Function Tests: Recent Labs  Lab 09/04/17 0716 09/05/17 0620 09/06/17 1128 09/07/17 0516  AST  --   --  12*  --   ALT  --   --  7*  --   ALKPHOS  --   --  77  --   BILITOT  --   --  0.4  --   PROT  --   --  6.3*  --   ALBUMIN 2.2* 2.1* 2.2* 2.1*   Recent Labs  Lab 09/06/17 1128  LIPASE 18   CBC: Recent Labs  Lab 09/02/17 1212 09/03/17 0523 09/05/17 0620 09/06/17 0548 09/07/17 0516 09/08/17 0208  WBC 12.9* 11.0* 15.6* 11.7* 9.0 8.6  NEUTROABS 8.8*  --   --  8.6*  --   --   HGB 11.5* 10.1* 9.6* 9.4* 8.7* 8.5*  HCT 33.1* 29.4* 28.0* 27.9* 25.5* 24.8*  MCV 88.3 88.0 88.1 88.9 87.6 87.3  PLT 263 227 240 255 234 237   CBG: Recent Labs  Lab 09/07/17 0755 09/07/17 1306 09/07/17 1650 09/07/17 2105 09/08/17 0651  GLUCAP 120* 107* 240* 223* 236*     IMAGING STUDIES Dg Esophagus  Result Date: 09/06/2017 CLINICAL DATA:  Persistent regurgitation. Gastroesophageal reflux disease. EXAM: ESOPHOGRAM/BARIUM SWALLOW TECHNIQUE: Single contrast examination was performed using  thin barium. FLUOROSCOPY TIME:  Fluoroscopy Time:  1 minutes Radiation Exposure Index (if provided by the fluoroscopic device): 8.3 mGy Number of Acquired Spot Images: 6 COMPARISON:  Chest radiograph 09/06/2017 FINDINGS: Exam is suboptimal in the patient could only drink small quantity of the thin barium for fear of regurgitation. No the swallowing dysfunction in the high cervical esophagus.  No stricture or mass evident within the esophagus. Small hiatal hernia is noted. No esophageal mucosal abnormality identified. IMPRESSION: 1. No gross abnormality in esophagus on limited study. No stricture or mass. 2. Small hiatal hernia. Electronically Signed   By: Suzy Bouchard M.D.   On: 09/06/2017 15:31   Dg Abd Acute W/chest  Result Date: 09/06/2017 CLINICAL DATA:  36 year old female with 4 days of mid abdominal pain, nausea vomiting. EXAM: DG ABDOMEN ACUTE W/ 1V CHEST COMPARISON:  Chest radiographs 02/21/2017. CT Abdomen and Pelvis 12/01/2012. FINDINGS: Seated AP view of the chest. The lungs are clear. No pneumothorax or pleural effusion. Normal cardiac size and mediastinal contours. Visualized tracheal air column is within normal limits. Supine and left-side-down lateral decubitus views of the abdomen. No pneumoperitoneum.  Stable cholecystectomy clips. Non obstructed bowel gas pattern. Prominent gas in the redundant sigmoid colon, but no dilated upstream large or small bowel loops. Abdominal and pelvic visceral contours are within normal limits. No acute osseous abnormality identified. IMPRESSION: 1. No evidence of bowel obstruction.  No free air. 2. Negative chest. Electronically Signed   By: Genevie Ann M.D.   On: 09/06/2017 10:33   Dg Foot Complete Left  Result Date: 09/02/2017 CLINICAL DATA:  Left foot pain after amputation. EXAM: LEFT FOOT - COMPLETE 3+ VIEW COMPARISON:  Radiographs of February 01, 2017. 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 No acute fracture or dislocation is noted. No lytic destruction is seen to suggest osteomyelitis. No soft tissue abnormality is noted. IMPRESSION: Status post surgical amputation as described above. No lytic destruction is seen to suggest osteomyelitis. Electronically Signed   By: Marijo Conception, M.D.   On: 09/02/2017 13:15    DISCHARGE EXAMINATION: Vitals:    09/07/17 2246 09/08/17 0200 09/08/17 0505 09/08/17 0815  BP:  114/76 118/77 125/62  Pulse: 89 90 90 90  Resp: 18 12 13 17   Temp:   97.8 F (36.6 C) 98.3 F (36.8 C)  TempSrc:   Oral Oral  SpO2:  96% 98% 94%  Weight:   63 kg (138 lb 14.2 oz)   Height:       General appearance: alert, cooperative, appears stated age and no distress Resp: clear to auscultation bilaterally Cardio: regular rate and rhythm, S1, S2 normal, no murmur, click, rub or gallop GI: soft, non-tender; bowel sounds normal; no masses,  no organomegaly Extremities: Left foot covered in a dressing.  DISPOSITION: Home  Discharge Instructions    Call MD for:  extreme fatigue   Complete by:  As directed    Call MD for:  persistant dizziness or light-headedness   Complete by:  As directed    Call MD for:  persistant nausea and vomiting   Complete by:  As directed    Call MD for:  severe uncontrolled pain   Complete by:  As directed    Call MD for:  temperature >100.4   Complete by:  As directed    Diet Carb Modified   Complete by:  As directed    Discharge instructions   Complete by:  As directed    Please follow up with Dr. Donzetta Matters as instructed. Follow up with your PCP in 1 week.  You were cared for by a hospitalist during your hospital stay. If you have any questions about your discharge medications or the care you received while you were in the hospital after you are discharged, you can call the unit and asked to speak with the hospitalist on call if the hospitalist that took care of you is not available. Once you are discharged, your primary care physician will handle any further medical issues. Please note that NO REFILLS for any discharge medications will be authorized once you are discharged, as it is imperative that you return to your primary care physician (or establish a relationship with a primary care physician if you do not have one) for your aftercare needs so that they can reassess your need for medications  and monitor your lab values. If you do not have a primary care physician, you can call [phone] for a physician referral.   Increase activity slowly   Complete by:  As directed  ALLERGIES:  Allergies  Allergen Reactions  . Hydrocodone-Acetaminophen Hives  . Tylenol [Acetaminophen] Itching and Swelling    SWELLING REACTION UNSPECIFIED      Allergies as of 09/08/2017      Reactions   Hydrocodone-acetaminophen Hives   Tylenol [acetaminophen] Itching, Swelling   SWELLING REACTION UNSPECIFIED       Medication List    TAKE these medications   ADVAIR DISKUS 100-50 MCG/DOSE Aepb Generic drug:  Fluticasone-Salmeterol Inhale 1 puff into the lungs daily.   alprazolam 2 MG tablet Commonly known as:  XANAX Take 1 tablet (2 mg total) by mouth 4 (four) times daily as needed for sleep.   cephALEXin 500 MG capsule Commonly known as:  KEFLEX Take 1 capsule (500 mg total) by mouth 2 (two) times daily.   clopidogrel 75 MG tablet Commonly known as:  PLAVIX Take 1 tablet (75 mg total) by mouth daily with breakfast.   collagenase ointment Commonly known as:  SANTYL Apply 1 application topically daily.   famotidine 20 MG tablet Commonly known as:  PEPCID Take 1 tablet (20 mg total) by mouth 2 (two) times daily.   gabapentin 800 MG tablet Commonly known as:  NEURONTIN Take 1 tablet (800 mg total) by mouth 2 (two) times daily. What changed:  when to take this   insulin glargine 100 UNIT/ML injection Commonly known as:  LANTUS Inject 0.2-0.3 mLs (20-30 Units total) into the skin 2 (two) times daily. 20 U in the morning and 30 U in the evening   INSULIN SYRINGE .5CC/31GX5/16" 31G X 5/16" 0.5 ML Misc 1 each by Does not apply route QID.   LATUDA 120 MG Tabs Generic drug:  Lurasidone HCl Take 120 mg by mouth at bedtime.   metFORMIN 1000 MG tablet Commonly known as:  GLUCOPHAGE Take 1 tablet (1,000 mg total) by mouth 2 (two) times daily. What changed:  how much to take Notes  to patient:  HOLD FOR 48 HRS POST PROCEDURE   metoCLOPramide 5 MG tablet Commonly known as:  REGLAN Take 1 tablet (5 mg total) by mouth 3 (three) times daily before meals.   metoprolol tartrate 25 MG tablet Commonly known as:  LOPRESSOR Take 0.5 tablets (12.5 mg total) by mouth 2 (two) times daily.   nicotine 21 mg/24hr patch Commonly known as:  NICODERM CQ - dosed in mg/24 hours Place 1 patch (21 mg total) onto the skin daily.   NOVOLOG 100 UNIT/ML injection Generic drug:  insulin aspart Inject 0-30 Units into the skin 3 (three) times daily with meals. Sliding Scale:  >300 15 units < 300 do not use   oxcarbazepine 600 MG tablet Commonly known as:  TRILEPTAL Take 600 mg by mouth 2 (two) times daily.   oxyCODONE-acetaminophen 5-325 MG tablet Commonly known as:  PERCOCET/ROXICET Take 1 tablet by mouth every 8 (eight) hours as needed for severe pain. What changed:  See the new instructions.   pantoprazole 40 MG tablet Commonly known as:  PROTONIX Take 1 tablet (40 mg total) by mouth daily.   PROAIR HFA 108 (90 Base) MCG/ACT inhaler Generic drug:  albuterol Inhale 2 puffs into the lungs every 6 (six) hours as needed for shortness of breath.   simvastatin 40 MG tablet Commonly known as:  ZOCOR Take 40 mg by mouth every evening.   tamsulosin 0.4 MG Caps capsule Commonly known as:  FLOMAX Take 0.4 mg by mouth daily.   zolpidem 10 MG tablet Commonly known as:  AMBIEN Take 10 mg by  mouth at bedtime as needed for sleep.        Follow-up Information    Waynetta Sandy, MD Follow up in 3 week(s).   Specialties:  Vascular Surgery, Cardiology Why:  Office will call you to arrange your appt (sent) Contact information: [place] [address] [place], [place], NP. Schedule an appointment as soon as possible for a visit in 1 week(s).   Specialty:  Nurse Practitioner Contact information: [facility] [place]  [place] [address] [phone]           TOTAL DISCHARGE TIME: 35 minutes  Bonnielee Haff  [facility] Pager [phone]  09/08/2017, 3:42 PM

## 2017-09-08 NOTE — Care Management Note (Signed)
Case Management Note  Patient Details  Name: Kathy Phelps MRN: [id number] Date of Birth: [birth year]  Subjective/Objective:   From home, pta indep, s/p pv intervention, will be on plavix.                  Action/Plan: DC home no needs.  Expected Discharge Date:  09/08/17               Expected Discharge Plan:  Home/Self Care  In-House Referral:     Discharge planning Services  CM Consult  Post Acute Care Choice:    Choice offered to:     DME Arranged:    DME Agency:     HH Arranged:    HH Agency:     Status of Service:     If discussed at [employer], dates discussed:    Additional Comments:  Zenon Mayo, RN 09/08/2017, 10:46 AM

## 2017-09-08 NOTE — Progress Notes (Signed)
  Progress Note    09/08/2017 9:19 AM 1 Day Post-Op  Subjective:  "can I go home now".  Says her foot is feeling better  Afebrile VSS 94% RA  Vitals:   09/08/17 0505 09/08/17 0815  BP: 118/77 125/62  Pulse: 90 90  Resp: 13 17  Temp: 97.8 F (36.6 C) 98.3 F (36.8 C)  SpO2: 98% 94%    Physical Exam: Cardiac:  regular Lungs:  Non labored Incisions:  Right groin is soft without hematoma Extremities:  Brisk doppler signals in the left AT/peroneal/PT     CBC    Component Value Date/Time   WBC 8.6 09/08/2017 0208   RBC 2.84 (L) 09/08/2017 0208   HGB 8.5 (L) 09/08/2017 0208   HCT 24.8 (L) 09/08/2017 0208   PLT 237 09/08/2017 0208   MCV 87.3 09/08/2017 0208   MCH 29.9 09/08/2017 0208   MCHC 34.3 09/08/2017 0208   RDW 12.2 09/08/2017 0208   LYMPHSABS 1.8 09/06/2017 0548   MONOABS 0.8 09/06/2017 0548   EOSABS 0.5 09/06/2017 0548   BASOSABS 0.0 09/06/2017 0548    BMET    Component Value Date/Time   NA 135 09/08/2017 0208   K 3.4 (L) 09/08/2017 0208   CL 107 09/08/2017 0208   CO2 22 09/08/2017 0208   GLUCOSE 270 (H) 09/08/2017 0208   BUN 5 (L) 09/08/2017 0208   CREATININE 1.15 (H) 09/08/2017 0208   CALCIUM 7.5 (L) 09/08/2017 0208   GFRNONAA 60 (L) 09/08/2017 0208   GFRAA >60 09/08/2017 0208    INR    Component Value Date/Time   INR 1.32 09/05/2017 0620     Intake/Output Summary (Last 24 hours) at 09/08/2017 0919 Last data filed at 09/08/2017 0700 Gross per 24 hour  Intake 1340 ml  Output 1200 ml  Net 140 ml     Assessment:  36 y.o. female is s/p:  Procedure Performed: 1.  US guided cannulation of right common femoral artery 2.  Aortogram with bilateral lower extremity runoff 3.  Stent of left popliteal with 73mm supera 4.  mynx closure of right common femoral artery 5.  Moderate sedation with fentanyl and versed for 73 minutes  1 Day Post-Op  Plan: -pt with brisk doppler signals left AT/peroneal/PT  -dressing removed (wound as above  pic) -continue daily dressing changes with Santyl -Rx given for Keflex 500mg  bid #20 NR, Plavix 75mg  daily #30 3RF and Santyl from Vascular standpoint.  Rx for percocet per [facility] -f/u with Dr. Donzetta Matters in 3-4 weeks with ABI's and LLE arterial duplex.   Leontine Locket, PA-C [facility] [phone] 09/08/2017 9:19 AM

## 2017-10-21 ENCOUNTER — Ambulatory Visit (INDEPENDENT_AMBULATORY_CARE_PROVIDER_SITE_OTHER)
Admission: RE | Admit: 2017-10-21 | Discharge: 2017-10-21 | Disposition: A | Discharge status: Home / Self Care | Payer: Medicaid Other | Source: Ambulatory Visit | Attending: Vascular Surgery | Admitting: Vascular Surgery

## 2017-10-21 ENCOUNTER — Ambulatory Visit: Payer: Medicaid Other | Admitting: Vascular Surgery

## 2017-10-21 ENCOUNTER — Ambulatory Visit (HOSPITAL_COMMUNITY)
Admission: RE | Admit: 2017-10-21 | Discharge: 2017-10-21 | Disposition: A | Discharge status: Home / Self Care | Payer: Medicaid Other | Source: Ambulatory Visit | Attending: Vascular Surgery | Admitting: Vascular Surgery

## 2017-10-21 DIAGNOSIS — M79605 Pain in left leg: Secondary | ICD-10-CM | POA: Diagnosis not present

## 2017-10-21 DIAGNOSIS — S91102A Unspecified open wound of left great toe without damage to nail, initial encounter: Secondary | ICD-10-CM | POA: Diagnosis not present

## 2017-10-21 DIAGNOSIS — I739 Peripheral vascular disease, unspecified: Secondary | ICD-10-CM | POA: Insufficient documentation

## 2017-10-21 DIAGNOSIS — X58XXXA Exposure to other specified factors, initial encounter: Secondary | ICD-10-CM | POA: Insufficient documentation

## 2017-10-21 LAB — VAS US LOWER EXTREMITY ARTERIAL DUPLEX
LATIBDISTSYS: 85 cm/s
LPERODISTSYS: 36 cm/s
LPTIBDISTSYS: 70 cm/s
LSFDPSV: -101 cm/s
Left super femoral mid sys PSV: -145 cm/s
Left super femoral prox sys PSV: 181 cm/s

## 2017-10-24 ENCOUNTER — Encounter: Payer: Self-pay | Admitting: Family

## 2017-10-24 ENCOUNTER — Ambulatory Visit (INDEPENDENT_AMBULATORY_CARE_PROVIDER_SITE_OTHER): Payer: Medicaid Other | Admitting: Family

## 2017-10-24 VITALS — BP 116/101 | HR 87 | Temp 97.1°F | Resp 16 | Ht 69.0 in | Wt 162.5 lb

## 2017-10-24 DIAGNOSIS — E1165 Type 2 diabetes mellitus with hyperglycemia: Secondary | ICD-10-CM

## 2017-10-24 DIAGNOSIS — IMO0002 Reserved for concepts with insufficient information to code with codable children: Secondary | ICD-10-CM

## 2017-10-24 DIAGNOSIS — I779 Disorder of arteries and arterioles, unspecified: Secondary | ICD-10-CM

## 2017-10-24 DIAGNOSIS — F17200 Nicotine dependence, unspecified, uncomplicated: Secondary | ICD-10-CM

## 2017-10-24 DIAGNOSIS — E1151 Type 2 diabetes mellitus with diabetic peripheral angiopathy without gangrene: Secondary | ICD-10-CM

## 2017-10-24 NOTE — Patient Instructions (Signed)
Steps to Quit Smoking Smoking tobacco can be bad for your health. It can also affect almost every organ in your body. Smoking puts you and people around you at risk for many serious long-lasting (chronic) diseases. Quitting smoking is hard, but it is one of the best things that you can do for your health. It is never too late to quit. What are the benefits of quitting smoking? When you quit smoking, you lower your risk for getting serious diseases and conditions. They can include:  Lung cancer or lung disease.  Heart disease.  Stroke.  Heart attack.  Not being able to have children (infertility).  Weak bones (osteoporosis) and broken bones (fractures).  If you have coughing, wheezing, and shortness of breath, those symptoms may get better when you quit. You may also get sick less often. If you are pregnant, quitting smoking can help to lower your chances of having a baby of low birth weight. What can I do to help me quit smoking? Talk with your doctor about what can help you quit smoking. Some things you can do (strategies) include:  Quitting smoking totally, instead of slowly cutting back how much you smoke over a period of time.  Going to in-person counseling. You are more likely to quit if you go to many counseling sessions.  Using resources and support systems, such as: ? Online chats with a counselor. ? Phone quitlines. ? Printed self-help materials. ? Support groups or group counseling. ? Text messaging programs. ? Mobile phone apps or applications.  Taking medicines. Some of these medicines may have nicotine in them. If you are pregnant or breastfeeding, do not take any medicines to quit smoking unless your doctor says it is okay. Talk with your doctor about counseling or other things that can help you.  Talk with your doctor about using more than one strategy at the same time, such as taking medicines while you are also going to in-person counseling. This can help make  quitting easier. What things can I do to make it easier to quit? Quitting smoking might feel very hard at first, but there is a lot that you can do to make it easier. Take these steps:  Talk to your family and friends. Ask them to support and encourage you.  Call phone quitlines, reach out to support groups, or work with a counselor.  Ask people who smoke to not smoke around you.  Avoid places that make you want (trigger) to smoke, such as: ? Bars. ? Parties. ? Smoke-break areas at work.  Spend time with people who do not smoke.  Lower the stress in your life. Stress can make you want to smoke. Try these things to help your stress: ? Getting regular exercise. ? Deep-breathing exercises. ? Yoga. ? Meditating. ? Doing a body scan. To do this, close your eyes, focus on one area of your body at a time from head to toe, and notice which parts of your body are tense. Try to relax the muscles in those areas.  Download or buy apps on your mobile phone or tablet that can help you stick to your quit plan. There are many free apps, such as QuitGuide from the CDC (Centers for Disease Control and Prevention). You can find more support from smokefree.gov and other websites.  This information is not intended to replace advice given to you by your health care provider. Make sure you discuss any questions you have with your health care provider. Document Released: 07/24/2009 Document   Revised: 05/25/2016 Document Reviewed: 02/11/2015 Elsevier Interactive Patient Education  2018 Elsevier Inc.     Peripheral Vascular Disease Peripheral vascular disease (PVD) is a disease of the blood vessels that are not part of your heart and brain. A simple term for PVD is poor circulation. In most cases, PVD narrows the blood vessels that carry blood from your heart to the rest of your body. This can result in a decreased supply of blood to your arms, legs, and internal organs, like your stomach or kidneys.  However, it most often affects a person's lower legs and feet. There are two types of PVD.  Organic PVD. This is the more common type. It is caused by damage to the structure of blood vessels.  Functional PVD. This is caused by conditions that make blood vessels contract and tighten (spasm).  Without treatment, PVD tends to get worse over time. PVD can also lead to acute ischemic limb. This is when an arm or limb suddenly has trouble getting enough blood. This is a medical emergency. Follow these instructions at home:  Take medicines only as told by your doctor.  Do not use any tobacco products, including cigarettes, chewing tobacco, or electronic cigarettes. If you need help quitting, ask your doctor.  Lose weight if you are overweight, and maintain a healthy weight as told by your doctor.  Eat a diet that is low in fat and cholesterol. If you need help, ask your doctor.  Exercise regularly. Ask your doctor for some good activities for you.  Take good care of your feet. ? Wear comfortable shoes that fit well. ? Check your feet often for any cuts or sores. Contact a doctor if:  You have cramps in your legs while walking.  You have leg pain when you are at rest.  You have coldness in a leg or foot.  Your skin changes.  You are unable to get or have an erection (erectile dysfunction).  You have cuts or sores on your feet that are not healing. Get help right away if:  Your arm or leg turns cold and blue.  Your arms or legs become red, warm, swollen, painful, or numb.  You have chest pain or trouble breathing.  You suddenly have weakness in your face, arm, or leg.  You become very confused or you cannot speak.  You suddenly have a very bad headache.  You suddenly cannot see. This information is not intended to replace advice given to you by your health care provider. Make sure you discuss any questions you have with your health care provider. Document Released:  12/22/2009 Document Revised: 03/04/2016 Document Reviewed: 03/07/2014 Elsevier Interactive Patient Education  2017 Elsevier Inc.  

## 2017-10-24 NOTE — Progress Notes (Signed)
Postoperative Visit   History of Present Illness  Kathy Phelps is a 37 y.o. female who is s/p stent placement of left popliteal with 34mm supera and mynx closure of right common femoral artery on 09-07-17 by Dr. Donzetta Matters for critical left lower extremity. Pt has a history of left foot wound that previously healed and then resurfaced after having toes 3,4, and 5 amputated on 02-10-17.  She had an ABI that was near normal but she also has noncompressible diabetic vessels.  Findings: Aorta and iliac segments were narrowed but were free of disease.  The right side lower extremity was incompletely visualized but appeared to have normal runoff to the tibials at least.  On the left there was recurrent disease at the area of the distal SFA proximal popliteal artery.  This was approximately 70% stenosed and was flow-limiting.  Following stenting there was 0% residual stenosis there was a proximal dissection which sealed with balloon angioplasty proximally.  She is scheduled to follow up with Dr. Donzetta Matters on 10-30-17;  ABI's and L LE arterial duplex were performed on 10-21-17.   She returns today with c/o severe pain in her left foot when she walks, states she was not provided with a post op off loading shoe.  She also c/o lesser pain in her left foot at rest. She has been taking ibuprofen for pain, states she had a hives reaction to Tylenol.  She denies any problems with her right lower extremity. She denies fever or chills. She states the topical prescribed by Dr. Donzetta Matters for her left foot wound was not helping to heal the wound, but the wet to dry NS dressings she has been doing have significantly helped heal her left lateral foot wound. She also describes that she has been debriding sloughing tissue from the wound. She states her left great toe looks better since the 09-07-17 procedure.   She states she was discharged from her previous PCP and is waiting for her case manager to assign her another PCP.   The  patient is able to complete their activities of daily living.   Last A1C result on file was 11.8 on 02-02-17 Tobacco Use: smokes 1/2 ppd x 28 years, states she was smoking 2 ppd, is trying to quit  For VQI Use Only  PRE-ADM LIVING: Home  AMB STATUS: Ambulatory   Past Medical History:  Diagnosis Date  . Bipolar affective ([place])   . Complete miscarriage   . Depression   . Diabetes mellitus   . Hyperlipidemia   . Hypertension   . Pregnancy complication    HELP Syndrome  . Renal disorder   . Schizophrenia [facility])     Past Surgical History:  Procedure Laterality Date  . ABDOMINAL AORTOGRAM N/A 09/07/2017   Procedure: ABDOMINAL AORTOGRAM;  Surgeon: Waynetta Sandy, MD;  Location: [place] [facility];  Service: Cardiovascular;  Laterality: N/A;  . ABDOMINAL AORTOGRAM W/LOWER EXTREMITY N/A 02/03/2017   Procedure: Abdominal Aortogram w/Lower Extremity;  Surgeon: Waynetta Sandy, MD;  Location: [place] [facility];  Service: Cardiovascular;  Laterality: N/A;  . AMPUTATION Left 02/10/2017   Procedure: AMPUTATION TOES 3, 4 AND 5  LEFT FOOT;  Surgeon: Waynetta Sandy, MD;  Location: [place];  Service: Vascular;  Laterality: Left;  . CESAREAN SECTION    . CHOLECYSTECTOMY    . LOWER EXTREMITY ANGIOGRAPHY Bilateral 09/07/2017   Procedure: Lower Extremity Angiography;  Surgeon: Waynetta Sandy, MD;  Location: [place] [facility];  Service: Cardiovascular;  Laterality: Bilateral;  . PERIPHERAL VASCULAR ATHERECTOMY  02/03/2017   Procedure: Peripheral Vascular Atherectomy;  Surgeon: Waynetta Sandy, MD;  Location: [place] [facility];  Service: Cardiovascular;;  . PERIPHERAL VASCULAR BALLOON ANGIOPLASTY  02/03/2017   Procedure: Peripheral Vascular Balloon Angioplasty;  Surgeon: Waynetta Sandy, MD;  Location: [place] [facility];  Service: Cardiovascular;;  . PERIPHERAL VASCULAR INTERVENTION Left 09/07/2017   Procedure: PERIPHERAL VASCULAR INTERVENTION;   Surgeon: Waynetta Sandy, MD;  Location: [place] [facility];  Service: Cardiovascular;  Laterality: Left;  SFA/POPLITEAL  . TUBAL LIGATION      Social History   Socioeconomic History  . Marital status: Single    Spouse name: Not on file  . Number of children: Not on file  . Years of education: Not on file  . Highest education level: Not on file  Social Needs  . Financial resource strain: Not on file  . Food insecurity - worry: Not on file  . Food insecurity - inability: Not on file  . Transportation needs - medical: Not on file  . Transportation needs - non-medical: Not on file  Occupational History  . Not on file  Tobacco Use  . Smoking status: Current Every Day Smoker    Packs/day: 0.50    Years: 28.00    Pack years: 14.00    Types: Cigarettes  . Smokeless tobacco: Never Used  Substance and Sexual Activity  . Alcohol use: No  . Drug use: No  . Sexual activity: Yes    Birth control/protection: None  Other Topics Concern  . Not on file  Social History Narrative  . Not on file    Allergies  Allergen Reactions  . Hydrocodone-Acetaminophen Hives  . Tylenol [Acetaminophen] Itching and Swelling    SWELLING REACTION UNSPECIFIED     Current Outpatient Medications on File Prior to Visit  Medication Sig Dispense Refill  . ADVAIR DISKUS 100-50 MCG/DOSE AEPB Inhale 1 puff into the lungs daily.  5  . alprazolam (XANAX) 2 MG tablet Take 1 tablet (2 mg total) by mouth 4 (four) times daily as needed for sleep. 30 tablet 0  . clopidogrel (PLAVIX) 75 MG tablet Take 1 tablet (75 mg total) by mouth daily with breakfast. 30 tablet 3  . collagenase (SANTYL) ointment Apply 1 application topically daily. 15 g 0  . famotidine (PEPCID) 20 MG tablet Take 1 tablet (20 mg total) by mouth 2 (two) times daily. 60 tablet 0  . gabapentin (NEURONTIN) 800 MG tablet Take 1 tablet (800 mg total) by mouth 2 (two) times daily. (Patient taking differently: Take 800 mg by mouth 5 (five) times  daily. ) 60 tablet 0  . insulin glargine (LANTUS) 100 UNIT/ML injection Inject 0.2-0.3 mLs (20-30 Units total) into the skin 2 (two) times daily. 20 U in the morning and 30 U in the evening 2 vial 0  . Insulin Syringe-Needle U-100 (INSULIN SYRINGE .5CC/31GX5/16") 31G X 5/16" 0.5 ML MISC 1 each by Does not apply route QID. 100 each 1  . LATUDA 120 MG TABS Take 120 mg by mouth at bedtime.  1  . metFORMIN (GLUCOPHAGE) 1000 MG tablet Take 1 tablet (1,000 mg total) by mouth 2 (two) times daily. (Patient taking differently: Take 1,500 mg by mouth 2 (two) times daily. ) 30 tablet 0  . metoCLOPramide (REGLAN) 5 MG tablet Take 1 tablet (5 mg total) by mouth 3 (three) times daily before meals. 90 tablet 0  . nicotine (NICODERM CQ -  DOSED IN MG/24 HOURS) 21 mg/24hr patch Place 1 patch (21 mg total) onto the skin daily. 28 patch 0  . NOVOLOG 100 UNIT/ML injection Inject 0-30 Units into the skin 3 (three) times daily with meals. Sliding Scale:  >300 15 units < 300 do not use 10 mL 3  . oxcarbazepine (TRILEPTAL) 600 MG tablet Take 600 mg by mouth 2 (two) times daily.  1  . pantoprazole (PROTONIX) 40 MG tablet Take 1 tablet (40 mg total) by mouth daily. 30 tablet 0  . PROAIR HFA 108 (90 BASE) MCG/ACT inhaler Inhale 2 puffs into the lungs every 6 (six) hours as needed for shortness of breath.   0  . simvastatin (ZOCOR) 40 MG tablet Take 40 mg by mouth every evening.   2  . tamsulosin (FLOMAX) 0.4 MG CAPS capsule Take 0.4 mg by mouth daily.  3  . zolpidem (AMBIEN) 10 MG tablet Take 10 mg by mouth at bedtime as needed for sleep.  2  . metoprolol tartrate (LOPRESSOR) 25 MG tablet Take 0.5 tablets (12.5 mg total) by mouth 2 (two) times daily. 60 tablet 0   No current facility-administered medications on file prior to visit.       Physical Examination  Vitals:   10/24/17 1001  BP: (!) 116/101  Pulse: 87  Resp: 16  Temp: (!) 97.1 F (36.2 C)  TempSrc: Oral  SpO2: 100%  Weight: 162 lb 8 oz (73.7 kg)    Height: 5\' 9"  (1.753 m)   Body mass index is 24 kg/m.  PHYSICAL EXAMINATION: General: The patient appears their stated age.   HEENT:  Teeth are darkly discolored, otherwise no gross abnormalities Pulmonary: Respirations are non-labored, CTAB, good air movement in all fields.  Abdomen: Soft and non-tender. Musculoskeletal: There are no major deformities. Surgically absent left 3rd, 4th, and 5th toes.   Neurologic: No focal weakness or paresthesias are detected, Skin: There are no rashes noted. Granulating and contracting shallow ulcer at lateral aspect left foot (see photo). Dry tip of left great toe with dry scaling and mottled dry skin at proximal aspect of toe (see photo). Psychiatric: The patient has normal affect. Cardiovascular: There is a regular rate and rhythm without significant murmur appreciated.    Vascular: Vessel Right Left  Radial Palpable Palpable  Ulnar Palpable Palpable  Carotid Palpable, without bruit Palpable, without bruit  Aorta Not palpable N/A  Femoral Palpable Palpable  Popliteal Not palpable Not palpable  PT  Palpable  Palpable  DP  Palpable  Palpable    Left foot, lateral aspect, healing ulcer   Left foot    DATA  Left LE Arterial Duplex (10-21-17): Velocities in the left CFA at 50-74% (219 cm/s).  Left popliteal artery stent appears patent with no restenosis.  Mostly biphasic waveforms, some triphasic.  This is the first postoperative exam.   ABI (Date: 10-21-17):  R:   ABI: 1.04 (was 1.17 on 08-11-17 at [facility]),   PT: tri  DP: tri  TBI:  1.03  L:   ABI: 1.09 (was 0.94),   PT: tri  DP: bi  TBI: unable to obtain Stable and normal right ABI with triphasic waveforms, improved ABI on the left since popliteal stent placed on 09-07-17, with tri and biphasic waveforms.   Medical Decision Making  Ninfa USERY is a 37 y.o. female who presents s/p stent placement of left popliteal with 11mm supera and mynx closure of right common  femoral artery on 09-07-17 by Dr. Donzetta Matters for  critical left lower extremity. Pt has a history of left foot wound that previously healed and then resurfaced after having toes 3,4, and 5 amputated on 02-10-17.  There are now palpable left pedal pulses; left lateral foot ulcer is healing with pt dressing changes. Continue W-D NS dressing changes daily and cleansing left foot daily with soap and warm water, pat dry with clean cloth.   She needs a post op shoe to help her walk with less pain in the left foot; prescription given for this.   Post op off loading shoe prescribed; pt given prescription to give to her case worker or take to her pharmacy.  Continue damp to dry NS dressing changes after cleansing left foot wound daily with antibacterial soap and warm water.   Follow up as scheduled with Dr. Donzetta Matters on 11-09-17; ABI's and left LE arterial duplex done on 10-21-17; results above.    I discussed in depth with the patient the nature of atherosclerosis, and emphasized the importance of maximal medical management including strict control of blood pressure, blood glucose, and lipid levels, obtaining regular exercise, and cessation of smoking.  The patient is aware that without maximal medical management the underlying atherosclerotic disease process will progress, limiting the benefit of any interventions.  Thank you for allowing [place] to participate in this patient's care.  Clemon Chambers, RN, MSN, FNP-C [facility] [place] Office: [phone]  10/24/2017, 10:21 AM  Clinic MD: Trula Slade

## 2017-11-09 ENCOUNTER — Ambulatory Visit (INDEPENDENT_AMBULATORY_CARE_PROVIDER_SITE_OTHER): Payer: Medicaid Other | Admitting: Vascular Surgery

## 2017-11-09 ENCOUNTER — Encounter: Payer: Self-pay | Admitting: Vascular Surgery

## 2017-11-09 VITALS — BP 160/103 | HR 100 | Temp 97.5°F | Resp 18 | Ht 69.0 in | Wt 162.5 lb

## 2017-11-09 DIAGNOSIS — E1165 Type 2 diabetes mellitus with hyperglycemia: Secondary | ICD-10-CM | POA: Diagnosis not present

## 2017-11-09 DIAGNOSIS — E1151 Type 2 diabetes mellitus with diabetic peripheral angiopathy without gangrene: Secondary | ICD-10-CM

## 2017-11-09 DIAGNOSIS — I779 Disorder of arteries and arterioles, unspecified: Secondary | ICD-10-CM | POA: Diagnosis not present

## 2017-11-09 DIAGNOSIS — F17200 Nicotine dependence, unspecified, uncomplicated: Secondary | ICD-10-CM | POA: Diagnosis not present

## 2017-11-09 DIAGNOSIS — T8131XD Disruption of external operation (surgical) wound, not elsewhere classified, subsequent encounter: Secondary | ICD-10-CM | POA: Diagnosis not present

## 2017-11-09 DIAGNOSIS — IMO0002 Reserved for concepts with insufficient information to code with codable children: Secondary | ICD-10-CM

## 2017-11-09 NOTE — Progress Notes (Signed)
Patient ID: Kathy Phelps, female   DOB: 1981-09-02, 37 y.o.   MRN: [id number]  Reason for Consult: Follow-up (2-3 month f/u )   Referred by Rubbie Battiest*  Subjective:     HPI:  Kathy Phelps is a 37 y.o. female status post stenting of her left popliteal artery for a left foot wound that had previously healed is at the site of her toe amputations.  The wound is now healing much better.  She says her foot is warm she is able to walk.  She does continue to smoke her blood pressure and blood sugars are poorly controlled.  She has not had any new wounds does not have fevers chills or constitutional symptoms.9  Past Medical History:  Diagnosis Date  . Bipolar affective ([place])   . Complete miscarriage   . Depression   . Diabetes mellitus   . Hyperlipidemia   . Hypertension   . Pregnancy complication    HELP Syndrome  . Renal disorder   . Schizophrenia ([place])    Family History  Problem Relation Age of Onset  . Diabetes Mellitus II Mother   . Heart disease Mother   . Heart disease Father    Past Surgical History:  Procedure Laterality Date  . ABDOMINAL AORTOGRAM N/A 09/07/2017   Procedure: ABDOMINAL AORTOGRAM;  Surgeon: Waynetta Sandy, MD;  Location: [place] [facility];  Service: Cardiovascular;  Laterality: N/A;  . ABDOMINAL AORTOGRAM W/LOWER EXTREMITY N/A 02/03/2017   Procedure: Abdominal Aortogram w/Lower Extremity;  Surgeon: Waynetta Sandy, MD;  Location: [place] [facility];  Service: Cardiovascular;  Laterality: N/A;  . AMPUTATION Left 02/10/2017   Procedure: AMPUTATION TOES 3, 4 AND 5  LEFT FOOT;  Surgeon: Waynetta Sandy, MD;  Location: [place];  Service: Vascular;  Laterality: Left;  . CESAREAN SECTION    . CHOLECYSTECTOMY    . LOWER EXTREMITY ANGIOGRAPHY Bilateral 09/07/2017   Procedure: Lower Extremity Angiography;  Surgeon: Waynetta Sandy, MD;  Location: [place] [facility];  Service: Cardiovascular;  Laterality: Bilateral;   . PERIPHERAL VASCULAR ATHERECTOMY  02/03/2017   Procedure: Peripheral Vascular Atherectomy;  Surgeon: Waynetta Sandy, MD;  Location: [place] [facility];  Service: Cardiovascular;;  . PERIPHERAL VASCULAR BALLOON ANGIOPLASTY  02/03/2017   Procedure: Peripheral Vascular Balloon Angioplasty;  Surgeon: Waynetta Sandy, MD;  Location: [place] [facility];  Service: Cardiovascular;;  . PERIPHERAL VASCULAR INTERVENTION Left 09/07/2017   Procedure: PERIPHERAL VASCULAR INTERVENTION;  Surgeon: Waynetta Sandy, MD;  Location: [place] [facility];  Service: Cardiovascular;  Laterality: Left;  SFA/POPLITEAL  . TUBAL LIGATION      Short Social History:  Social History   Tobacco Use  . Smoking status: Current Every Day Smoker    Packs/day: 0.50    Years: 28.00    Pack years: 14.00    Types: Cigarettes  . Smokeless tobacco: Never Used  Substance Use Topics  . Alcohol use: No    Allergies  Allergen Reactions  . Hydrocodone-Acetaminophen Hives  . Tylenol [Acetaminophen] Itching and Swelling    SWELLING REACTION UNSPECIFIED     Current Outpatient Medications  Medication Sig Dispense Refill  . ADVAIR DISKUS 100-50 MCG/DOSE AEPB Inhale 1 puff into the lungs daily.  5  . alprazolam (XANAX) 2 MG tablet Take 1 tablet (2 mg total) by mouth 4 (four) times daily as needed for sleep. 30 tablet 0  . clopidogrel (PLAVIX) 75 MG tablet Take 1 tablet (75 mg total)  by mouth daily with breakfast. 30 tablet 3  . collagenase (SANTYL) ointment Apply 1 application topically daily. 15 g 0  . famotidine (PEPCID) 20 MG tablet Take 1 tablet (20 mg total) by mouth 2 (two) times daily. 60 tablet 0  . gabapentin (NEURONTIN) 800 MG tablet Take 1 tablet (800 mg total) by mouth 2 (two) times daily. (Patient taking differently: Take 800 mg by mouth 5 (five) times daily. ) 60 tablet 0  . insulin glargine (LANTUS) 100 UNIT/ML injection Inject 0.2-0.3 mLs (20-30 Units total) into the skin 2 (two) times  daily. 20 U in the morning and 30 U in the evening 2 vial 0  . Insulin Syringe-Needle U-100 (INSULIN SYRINGE .5CC/31GX5/16") 31G X 5/16" 0.5 ML MISC 1 each by Does not apply route QID. 100 each 1  . LATUDA 120 MG TABS Take 120 mg by mouth at bedtime.  1  . metFORMIN (GLUCOPHAGE) 1000 MG tablet Take 1 tablet (1,000 mg total) by mouth 2 (two) times daily. (Patient taking differently: Take 1,500 mg by mouth 2 (two) times daily. ) 30 tablet 0  . metoCLOPramide (REGLAN) 5 MG tablet Take 1 tablet (5 mg total) by mouth 3 (three) times daily before meals. 90 tablet 0  . metoprolol tartrate (LOPRESSOR) 25 MG tablet Take 0.5 tablets (12.5 mg total) by mouth 2 (two) times daily. 60 tablet 0  . nicotine (NICODERM CQ - DOSED IN MG/24 HOURS) 21 mg/24hr patch Place 1 patch (21 mg total) onto the skin daily. 28 patch 0  . NOVOLOG 100 UNIT/ML injection Inject 0-30 Units into the skin 3 (three) times daily with meals. Sliding Scale:  >300 15 units < 300 do not use 10 mL 3  . oxcarbazepine (TRILEPTAL) 600 MG tablet Take 600 mg by mouth 2 (two) times daily.  1  . pantoprazole (PROTONIX) 40 MG tablet Take 1 tablet (40 mg total) by mouth daily. 30 tablet 0  . PROAIR HFA 108 (90 BASE) MCG/ACT inhaler Inhale 2 puffs into the lungs every 6 (six) hours as needed for shortness of breath.   0  . simvastatin (ZOCOR) 40 MG tablet Take 40 mg by mouth every evening.   2  . tamsulosin (FLOMAX) 0.4 MG CAPS capsule Take 0.4 mg by mouth daily.  3  . zolpidem (AMBIEN) 10 MG tablet Take 10 mg by mouth at bedtime as needed for sleep.  2   No current facility-administered medications for this visit.     Review of Systems  Constitutional:  Constitutional negative. HENT: HENT negative.  Eyes: Eyes negative.  Cardiovascular: Cardiovascular negative.  GI: Gastrointestinal negative.  Musculoskeletal: Musculoskeletal negative.  Skin: Positive for wound.  Neurological: Neurological negative. Hematologic: Hematologic/lymphatic negative.   Psychiatric: Psychiatric negative.        Objective:  Objective   Vitals:   11/09/17 1542 11/09/17 1543  BP: (!) 150/103 (!) 160/103  Pulse: 100   Resp: 18   Temp: (!) 97.5 F (36.4 C)   TempSrc: Oral   SpO2: 99%   Weight: 162 lb 8 oz (73.7 kg)   Height: 5\' 9"  (1.753 m)    Body mass index is 24 kg/m.  Physical Exam  Constitutional: She is oriented to person, place, and time. She appears well-developed.  HENT:  Head: Normocephalic.  Eyes: Pupils are equal, round, and reactive to light.  Cardiovascular:  Pulses:      Femoral pulses are 2+ on the right side, and 2+ on the left side.  Popliteal pulses are 2+ on the left side.       Dorsalis pedis pulses are 2+ on the left side.  Abdominal: Soft.  Musculoskeletal: Normal range of motion. She exhibits no edema.  Neurological: She is alert and oriented to person, place, and time. She has normal reflexes.  Skin: Skin is warm and dry.  1cm lateral foot ulceration with granulation  Psychiatric: She has a normal mood and affect. Her behavior is normal. Thought content normal.    Data:      Assessment/Plan:    37 year old female status post left popliteal artery stenting.  She is healing her wound now has palpable pulse in the foot.  I discussed with her the need for smoking cessation is been very difficult to get her blood pressure and blood sugar under control.  She was not given any pain medicine today.  She will follow-up in 3 months with repeat ABIs.  Should the wound have any further issues I would like to see her on an ASAP basis.  She demonstrates good understanding.     Waynetta Sandy MD [facility]

## 2017-11-10 ENCOUNTER — Other Ambulatory Visit: Payer: Self-pay

## 2017-11-10 DIAGNOSIS — M79675 Pain in left toe(s): Secondary | ICD-10-CM

## 2017-11-29 ENCOUNTER — Emergency Department (HOSPITAL_COMMUNITY)
Admission: EM | Admit: 2017-11-29 | Discharge: 2017-11-29 | Discharge status: Against Medical Advice | Payer: Medicaid Other | Attending: Emergency Medicine | Admitting: Emergency Medicine

## 2017-11-29 DIAGNOSIS — Z5321 Procedure and treatment not carried out due to patient leaving prior to being seen by health care provider: Secondary | ICD-10-CM | POA: Insufficient documentation

## 2017-12-13 ENCOUNTER — Other Ambulatory Visit: Payer: Self-pay

## 2017-12-13 ENCOUNTER — Emergency Department (HOSPITAL_COMMUNITY)
Admission: EM | Admit: 2017-12-13 | Discharge: 2017-12-13 | Disposition: A | Discharge status: Home / Self Care | Payer: Medicaid Other | Attending: Physician Assistant | Admitting: Physician Assistant

## 2017-12-13 ENCOUNTER — Encounter (HOSPITAL_COMMUNITY): Payer: Self-pay | Admitting: Emergency Medicine

## 2017-12-13 ENCOUNTER — Emergency Department (HOSPITAL_COMMUNITY): Payer: Medicaid Other

## 2017-12-13 DIAGNOSIS — Z79899 Other long term (current) drug therapy: Secondary | ICD-10-CM | POA: Insufficient documentation

## 2017-12-13 DIAGNOSIS — Z794 Long term (current) use of insulin: Secondary | ICD-10-CM | POA: Diagnosis not present

## 2017-12-13 DIAGNOSIS — L97529 Non-pressure chronic ulcer of other part of left foot with unspecified severity: Secondary | ICD-10-CM | POA: Diagnosis not present

## 2017-12-13 DIAGNOSIS — F17210 Nicotine dependence, cigarettes, uncomplicated: Secondary | ICD-10-CM | POA: Insufficient documentation

## 2017-12-13 DIAGNOSIS — M79672 Pain in left foot: Secondary | ICD-10-CM | POA: Diagnosis present

## 2017-12-13 DIAGNOSIS — Z7902 Long term (current) use of antithrombotics/antiplatelets: Secondary | ICD-10-CM | POA: Diagnosis not present

## 2017-12-13 DIAGNOSIS — I10 Essential (primary) hypertension: Secondary | ICD-10-CM | POA: Diagnosis not present

## 2017-12-13 DIAGNOSIS — E119 Type 2 diabetes mellitus without complications: Secondary | ICD-10-CM | POA: Diagnosis not present

## 2017-12-13 LAB — CBC WITH DIFFERENTIAL/PLATELET
BASOS PCT: 0 %
Basophils Absolute: 0 10*3/uL (ref 0.0–0.1)
EOS ABS: 0.3 10*3/uL (ref 0.0–0.7)
EOS PCT: 3 %
HCT: 30.3 % — ABNORMAL LOW (ref 36.0–46.0)
Hemoglobin: 10.2 g/dL — ABNORMAL LOW (ref 12.0–15.0)
Lymphocytes Relative: 25 %
Lymphs Abs: 2.5 10*3/uL (ref 0.7–4.0)
MCH: 29.4 pg (ref 26.0–34.0)
MCHC: 33.7 g/dL (ref 30.0–36.0)
MCV: 87.3 fL (ref 78.0–100.0)
MONO ABS: 0.4 10*3/uL (ref 0.1–1.0)
MONOS PCT: 4 %
Neutro Abs: 6.9 10*3/uL (ref 1.7–7.7)
Neutrophils Relative %: 68 %
Platelets: 240 10*3/uL (ref 150–400)
RBC: 3.47 MIL/uL — ABNORMAL LOW (ref 3.87–5.11)
RDW: 12.7 % (ref 11.5–15.5)
WBC: 10.2 10*3/uL (ref 4.0–10.5)

## 2017-12-13 LAB — BASIC METABOLIC PANEL
Anion gap: 9 (ref 5–15)
BUN: 10 mg/dL (ref 6–20)
CALCIUM: 8.6 mg/dL — AB (ref 8.9–10.3)
CO2: 25 mmol/L (ref 22–32)
CREATININE: 1.48 mg/dL — AB (ref 0.44–1.00)
Chloride: 99 mmol/L — ABNORMAL LOW (ref 101–111)
GFR calc non Af Amer: 44 mL/min — ABNORMAL LOW (ref >60)
GFR, EST AFRICAN AMERICAN: 51 mL/min — AB (ref >60)
Glucose, Bld: 436 mg/dL — ABNORMAL HIGH (ref 65–99)
Potassium: 4.1 mmol/L (ref 3.5–5.1)
SODIUM: 133 mmol/L — AB (ref 135–145)

## 2017-12-13 LAB — CBG MONITORING, ED: Glucose-Capillary: 384 mg/dL — ABNORMAL HIGH (ref 65–99)

## 2017-12-13 LAB — SEDIMENTATION RATE: Sed Rate: 63 mm/hr — ABNORMAL HIGH (ref 0–22)

## 2017-12-13 LAB — I-STAT CG4 LACTIC ACID, ED: LACTIC ACID, VENOUS: 1.56 mmol/L (ref 0.5–1.9)

## 2017-12-13 MED ORDER — OXYCODONE-ACETAMINOPHEN 5-325 MG PO TABS
1.0000 | ORAL_TABLET | Freq: Once | ORAL | Status: AC
  Administered 2017-12-13: 1 via ORAL
  Filled 2017-12-13: qty 1

## 2017-12-13 MED ORDER — HYDROMORPHONE HCL 1 MG/ML IJ SOLN
0.5000 mg | Freq: Once | INTRAMUSCULAR | Status: AC
  Administered 2017-12-13: 0.5 mg via INTRAVENOUS
  Filled 2017-12-13: qty 1

## 2017-12-13 MED ORDER — OXYCODONE-ACETAMINOPHEN 5-325 MG PO TABS: 2.0000 | ORAL_TABLET | ORAL | 0 refills | Status: DC | PRN

## 2017-12-13 MED ORDER — SODIUM CHLORIDE 0.9 % IV BOLUS (SEPSIS)
1000.0000 mL | Freq: Once | INTRAVENOUS | Status: AC
  Administered 2017-12-13: 1000 mL via INTRAVENOUS

## 2017-12-13 MED ORDER — CEPHALEXIN 500 MG PO CAPS: 500.0000 mg | ORAL_CAPSULE | Freq: Four times a day (QID) | ORAL | 0 refills | Status: AC

## 2017-12-13 MED ORDER — SODIUM CHLORIDE 0.9 % IV SOLN
1.0000 g | Freq: Once | INTRAVENOUS | Status: AC
  Administered 2017-12-13: 1 g via INTRAVENOUS
  Filled 2017-12-13: qty 10

## 2017-12-13 NOTE — ED Provider Notes (Signed)
Patient placed in Quick Look pathway, seen and evaluated   Chief Complaint: left foot pain  HPI:   Pt presents with left foot pain, swelling, redness, draining ulcer.  Patient is a poorly controlled diabetic.  History of prior foot ulcers.  Patient has history of prior toe amputation on the same foot.  States noticed symptoms 2 days ago.  Denies any fever or chills.  Reports recent abscess to the right axilla which is improving, states spontaneously drained.  History of prior hospitalization for the same.  States CBG this morning was over 600.  She took her insulin and medications prior to coming in.  ROS: Positive for foot pain, redness, swelling, drainage, ulcer.  Negative for fever, chills, generalized malaise.  Physical Exam:   Gen: No distress  Neuro: Awake and Alert  Skin: Warm    Focused Exam: Healing abscess to the right axilla, open, no active drainage, no surrounding cellulitis. There is a 2x2cm ulcer to the left lateral foot, draining, malodorous, with surrounding cellulitic changes. Amputation to toes 3-5.    Labs, xray foot ordered.  Vital signs are normal at this time other than bradycardia, no concern for sepsis.  Percocet ordered for pain.   Initiation of care has begun. The patient has been counseled on the process, plan, and necessity for staying for the completion/evaluation, and the remainder of the medical screening examination    Jeannett Senior, PA-C 12/13/17 [place], Gwenyth Allegra, MD 12/13/17 [phone]

## 2017-12-13 NOTE — ED Notes (Signed)
ED Provider at bedside. 

## 2017-12-13 NOTE — ED Notes (Signed)
Patient verbalizes understanding of discharge instructions. Opportunity for questioning and answers were provided. Armband removed by staff, pt discharged from ED ambulatory with family.  

## 2017-12-13 NOTE — ED Provider Notes (Signed)
[facility] EMERGENCY DEPARTMENT Provider Note   CSN: [id number] Arrival date & time: 12/13/17  1449     History   Chief Complaint Chief Complaint  Patient presents with  . Foot Pain    HPI Kathy Phelps is a 37 y.o. female with past medical history of poorly controlled diabetes, hypertension, hyperlipidemia, who presents to ED for evaluation of ongoing left foot pain, swelling and draining ulcer.  States the symptoms have been going on for the past week.  She states that there was a cyst which spontaneously drained 2 days ago.  He was hospitalized for similar symptoms in November 2018 and had 3 of her toes on this foot amputated as a result of it.  She does report compliance with her home diabetes medications.  Denies any injuries, numbness in foot.  HPI  Past Medical History:  Diagnosis Date  . Bipolar affective ([place])   . Complete miscarriage   . Depression   . Diabetes mellitus   . Hyperlipidemia   . Hypertension   . Pregnancy complication    HELP Syndrome  . Renal disorder   . Schizophrenia [facility])     Patient Active Problem List   Diagnosis Date Noted  . Surgical wound, non healing 09/02/2017  . Bipolar 1 disorder ([place]) 09/02/2017  . Nausea vomiting and diarrhea 08/11/2017  . Great toe pain, left 08/11/2017  . GERD (gastroesophageal reflux disease) 08/10/2017  . Anxiety 08/10/2017  . Edema   . Cellulitis   . AKI (acute kidney injury) ([place]) 02/04/2017  . Cellulitis of left foot 02/02/2017  . Type II diabetes mellitus with renal manifestations, uncontrolled ([place]) 02/02/2017  . HLD (hyperlipidemia) 02/02/2017  . Tobacco abuse 02/02/2017  . Essential hypertension 02/02/2017  . Malnutrition of moderate degree 02/02/2017    Past Surgical History:  Procedure Laterality Date  . ABDOMINAL AORTOGRAM N/A 09/07/2017   Procedure: ABDOMINAL AORTOGRAM;  Surgeon: Waynetta Sandy, MD;  Location: [place] [facility];  Service: Cardiovascular;   Laterality: N/A;  . ABDOMINAL AORTOGRAM W/LOWER EXTREMITY N/A 02/03/2017   Procedure: Abdominal Aortogram w/Lower Extremity;  Surgeon: Waynetta Sandy, MD;  Location: [place] [facility];  Service: Cardiovascular;  Laterality: N/A;  . AMPUTATION Left 02/10/2017   Procedure: AMPUTATION TOES 3, 4 AND 5  LEFT FOOT;  Surgeon: Waynetta Sandy, MD;  Location: [place];  Service: Vascular;  Laterality: Left;  . CESAREAN SECTION    . CHOLECYSTECTOMY    . LOWER EXTREMITY ANGIOGRAPHY Bilateral 09/07/2017   Procedure: Lower Extremity Angiography;  Surgeon: Waynetta Sandy, MD;  Location: [place] [facility];  Service: Cardiovascular;  Laterality: Bilateral;  . PERIPHERAL VASCULAR ATHERECTOMY  02/03/2017   Procedure: Peripheral Vascular Atherectomy;  Surgeon: Waynetta Sandy, MD;  Location: [place] [facility] LAB;  Service: Cardiovascular;;  . PERIPHERAL VASCULAR BALLOON ANGIOPLASTY  02/03/2017   Procedure: Peripheral Vascular Balloon Angioplasty;  Surgeon: Waynetta Sandy, MD;  Location: [place] [facility];  Service: Cardiovascular;;  . PERIPHERAL VASCULAR INTERVENTION Left 09/07/2017   Procedure: PERIPHERAL VASCULAR INTERVENTION;  Surgeon: Waynetta Sandy, MD;  Location: [place] [facility];  Service: Cardiovascular;  Laterality: Left;  SFA/POPLITEAL  . TUBAL LIGATION      OB History    No data available       Home Medications    Prior to Admission medications   Medication Sig Start Date End Date Taking? Authorizing Provider  ADVAIR DISKUS 100-50 MCG/DOSE AEPB Inhale 1 puff into the  lungs daily. 01/10/15  Yes [provider]  alprazolam Duanne Moron) 2 MG tablet Take 1 tablet (2 mg total) by mouth 4 (four) times daily as needed for sleep. Patient taking differently: Take 2 mg by mouth 4 (four) times daily.  08/12/17  Yes Debbe Odea, MD  clopidogrel (PLAVIX) 75 MG tablet Take 1 tablet (75 mg total) by mouth daily with breakfast. 09/08/17  Yes Rhyne,  Samantha J, PA-C  gabapentin (NEURONTIN) 800 MG tablet Take 1 tablet (800 mg total) by mouth 2 (two) times daily. Patient taking differently: Take 800 mg by mouth 5 (five) times daily.  02/11/17  Yes Hosie Poisson, MD  insulin glargine (LANTUS) 100 UNIT/ML injection Inject 0.2-0.3 mLs (20-30 Units total) into the skin 2 (two) times daily. 20 U in the morning and 30 U in the evening Patient taking differently: Inject 20-30 Units into the skin See admin instructions. 20 U in the morning and 30 U in the evening 08/12/17  Yes Gherghe, Costin M, MD  LATUDA 120 MG TABS Take 120 mg by mouth at bedtime. 04/12/16  Yes [provider]  metFORMIN (GLUCOPHAGE) 1000 MG tablet Take 1 tablet (1,000 mg total) by mouth 2 (two) times daily. Patient taking differently: Take 1,500 mg by mouth 2 (two) times daily.  07/16/14  Yes Pisciotta, Elmyra Ricks, PA-C  metoCLOPramide (REGLAN) 5 MG tablet Take 1 tablet (5 mg total) by mouth 3 (three) times daily before meals. 09/08/17  Yes Bonnielee Haff, MD  metoprolol tartrate (LOPRESSOR) 25 MG tablet Take 0.5 tablets (12.5 mg total) by mouth 2 (two) times daily. 02/11/17  Yes Hosie Poisson, MD  NOVOLOG 100 UNIT/ML injection Inject 0-30 Units into the skin 3 (three) times daily with meals. Sliding Scale:  >300 15 units < 300 do not use 08/12/17  Yes Gherghe, Vella Redhead, MD  oxcarbazepine (TRILEPTAL) 600 MG tablet Take 600 mg by mouth 2 (two) times daily. 04/12/16  Yes [provider]  PROAIR HFA 108 (90 BASE) MCG/ACT inhaler Inhale 2 puffs into the lungs every 6 (six) hours as needed for shortness of breath.  01/10/15  Yes [provider]  simvastatin (ZOCOR) 40 MG tablet Take 40 mg by mouth every evening.  04/25/16  Yes [provider]  tamsulosin (FLOMAX) 0.4 MG CAPS capsule Take 0.4 mg by mouth daily. 04/12/16  Yes [provider]  zolpidem (AMBIEN) 10 MG tablet Take 10 mg by mouth at bedtime as needed for sleep. 04/26/16  Yes [provider]    cephALEXin (KEFLEX) 500 MG capsule Take 1 capsule (500 mg total) by mouth 4 (four) times daily for 14 days. 12/13/17 12/27/17  Marilu Rylander, PA-C  collagenase (SANTYL) ointment Apply 1 application topically daily. Patient not taking: Reported on 12/13/2017 09/08/17   Gabriel Earing, PA-C  famotidine (PEPCID) 20 MG tablet Take 1 tablet (20 mg total) by mouth 2 (two) times daily. Patient not taking: Reported on 12/13/2017 08/12/17   Debbe Odea, MD  Insulin Syringe-Needle U-100 (INSULIN SYRINGE .5CC/31GX5/16") 31G X 5/16" 0.5 ML MISC 1 each by Does not apply route QID. 08/12/17   Gherghe, Vella Redhead, MD  nicotine (NICODERM CQ - DOSED IN MG/24 HOURS) 21 mg/24hr patch Place 1 patch (21 mg total) onto the skin daily. Patient not taking: Reported on 12/13/2017 02/12/17   Hosie Poisson, MD  oxyCODONE-acetaminophen (PERCOCET/ROXICET) 5-325 MG tablet Take 2 tablets by mouth every 4 (four) hours as needed for severe pain. 12/13/17   Mir Fullilove, PA-C  pantoprazole (PROTONIX) 40  MG tablet Take 1 tablet (40 mg total) by mouth daily. Patient not taking: Reported on 12/13/2017 02/11/17   Hosie Poisson, MD    Family History Family History  Problem Relation Age of Onset  . Diabetes Mellitus II Mother   . Heart disease Mother   . Heart disease Father     Social History Social History   Tobacco Use  . Smoking status: Current Every Day Smoker    Packs/day: 0.50    Years: 28.00    Pack years: 14.00    Types: Cigarettes  . Smokeless tobacco: Never Used  Substance Use Topics  . Alcohol use: No  . Drug use: No     Allergies   Hydrocodone-acetaminophen and Tylenol [acetaminophen]   Review of Systems Review of Systems  Constitutional: Negative for appetite change, chills and fever.  HENT: Negative for ear pain, rhinorrhea, sneezing and sore throat.   Eyes: Negative for photophobia and visual disturbance.  Respiratory: Negative for cough, chest tightness, shortness of breath and wheezing.   Cardiovascular:  Negative for chest pain and palpitations.  Gastrointestinal: Negative for abdominal pain, blood in stool, constipation, diarrhea, nausea and vomiting.  Genitourinary: Negative for dysuria, hematuria and urgency.  Musculoskeletal: Negative for myalgias.  Skin: Positive for color change and wound. Negative for rash.  Neurological: Negative for dizziness, weakness and light-headedness.     Physical Exam Updated Vital Signs BP (!) 148/85   Pulse 92   Temp 98.8 F (37.1 C) (Oral)   Resp 17   Ht 5\' 9"  (1.753 m)   Wt 68 kg (150 lb)   SpO2 100%   BMI 22.15 kg/m   Physical Exam  Constitutional: She appears well-developed and well-nourished. No distress.  HENT:  Head: Normocephalic and atraumatic.  Nose: Nose normal.  Eyes: Conjunctivae and EOM are normal. Left eye exhibits no discharge. No scleral icterus.  Neck: Normal range of motion. Neck supple.  Cardiovascular: Normal rate, regular rhythm, normal heart sounds and intact distal pulses. Exam reveals no gallop and no friction rub.  No murmur heard. Pulmonary/Chest: Effort normal and breath sounds normal. No respiratory distress.  Abdominal: Soft. Bowel sounds are normal. She exhibits no distension. There is no tenderness. There is no guarding.  Musculoskeletal: Normal range of motion. She exhibits tenderness. She exhibits no edema.  Tenderness to palpation of the wound as indicated in the image.  2+ DP pulse noted.  Able wiggle remainder of toes without difficulty.  Neurological: She is alert. She exhibits normal muscle tone. Coordination normal.  Skin: Skin is warm and dry. No rash noted.  Psychiatric: She has a normal mood and affect.  Nursing note and vitals reviewed.      ED Treatments / Results  Labs (all labs ordered are listed, but only abnormal results are displayed) Labs Reviewed  CBC WITH DIFFERENTIAL/PLATELET - Abnormal; Notable for the following components:      Result Value   RBC 3.47 (*)    Hemoglobin 10.2  (*)    HCT 30.3 (*)    All other components within normal limits  BASIC METABOLIC PANEL - Abnormal; Notable for the following components:   Sodium 133 (*)    Chloride 99 (*)    Glucose, Bld 436 (*)    Creatinine, Ser 1.48 (*)    Calcium 8.6 (*)    GFR calc non Af Amer 44 (*)    GFR calc Af Amer 51 (*)    All other components within normal limits  SEDIMENTATION RATE -  Abnormal; Notable for the following components:   Sed Rate 63 (*)    All other components within normal limits  CBG MONITORING, ED - Abnormal; Notable for the following components:   Glucose-Capillary 384 (*)    All other components within normal limits  I-STAT CG4 LACTIC ACID, ED    EKG  EKG Interpretation None       Radiology Dg Foot Complete Left  Result Date: 12/13/2017 CLINICAL DATA:  Recurrent ulcer and possible infection. History of amputation and diabetes. EXAM: LEFT FOOT - COMPLETE 3+ VIEW COMPARISON:  LEFT foot radiograph September 02, 2017 FINDINGS: Status post transmetatarsal amputation third through fifth rays. Focal reabsorption of distal fifth metatarsus from prior examination with new periosteal reaction. Overlying soft tissue irregularity and ulcer with bandage. Stable appearance of third and fourth metatarsal. Flexed second toe compatible with hammertoe deformity. Faint plantar calcifications associated with fasciitis. IMPRESSION: Status post third through fifth transmetatarsal amputation. Periosteal reaction and lucency distal fifth metatarsus concerning for osteomyelitis associated with soft tissue swelling and ulcer. Electronically Signed   By: Elon Alas M.D.   On: 12/13/2017 16:06    Procedures Procedures (including critical care time)  Medications Ordered in ED Medications  oxyCODONE-acetaminophen (PERCOCET/ROXICET) 5-325 MG per tablet 1 tablet (1 tablet Oral Given 12/13/17 1546)  sodium chloride 0.9 % bolus 1,000 mL (0 mLs Intravenous Stopped 12/13/17 1944)  cefTRIAXone (ROCEPHIN) 1 g in  sodium chloride 0.9 % 100 mL IVPB (0 g Intravenous Stopped 12/13/17 1945)  HYDROmorphone (DILAUDID) injection 0.5 mg (0.5 mg Intravenous Given 12/13/17 1923)     Initial Impression / Assessment and Plan / ED Course  I have reviewed the triage vital signs and the nursing notes.  Pertinent labs & imaging results that were available during my care of the patient were reviewed by me and considered in my medical decision making (see chart for details).     Patient presents to ED for evaluation of ongoing pain at the left foot ulcer.  States that there is an abscess on the foot that popped on its own 2 days ago.  She was seen and evaluated for similar complaints approximately 3 months ago and was hospitalized as a result of it.  On physical exam her wound now appears chronic in nature.  It does not appear to be draining or have erythema surrounding.  She has 2+ DP pulse noted and able to wiggle the remainder of the digits on the foot.  She is afebrile with no history of fever.  Initial lactate was negative.  CBC unremarkable with normal white count.  X-ray did show possible findings of osteomyelitis.  I talked to the orthopedist on-call who reviewed her images stated that her wounds appear chronic in nature.  I also spoke to vascular surgery on-call who had a similar impression.  Patient given IV antibiotics here and will discharge home with a course of Keflex.  We will also give pain medication to be taken as needed.  Of note, patient does have acetaminophen listed as her allergies.  With further chart review it appears that she has taken Percocet previously without issues.  [place] narcotic database reviewed without discrepancies and did show Percocet prescriptions given several times as well.  Patient denies any symptoms with Percocet.  Patient appears stable for discharge at this time.  Strict return precautions given.  Portions of this note were generated with [employer]. Dictation errors may  occur despite best attempts at proofreading.   Final Clinical Impressions(s) /  ED Diagnoses   Final diagnoses:  Ulcer of foot, chronic, left, with unspecified severity [facility])    ED Discharge Orders        Ordered    cephALEXin (KEFLEX) 500 MG capsule  4 times daily     12/13/17 2013    oxyCODONE-acetaminophen (PERCOCET/ROXICET) 5-325 MG tablet  Every 4 hours PRN     12/13/17 2027       Delia Heady, PA-C 12/13/17 2228    Macarthur Critchley, MD 12/14/17 1845

## 2017-12-13 NOTE — ED Triage Notes (Addendum)
In April of 2018 had last 3 toes of left foot amputated has had a few issues since ,  2 days ago startdd to have more pain and drainage at site of amputation and foot has a red spot, pt is diabtetic,pt states she had a knot under her rt arm and it burst 3 days ago

## 2017-12-13 NOTE — Discharge Instructions (Addendum)
Patient attached information regarding her condition. Take antibiotics as directed. Take pain medication as needed for pain. I have contacted Dr. Claretha Cooper office who will schedule you for an appointment for a follow-up.

## 2017-12-14 ENCOUNTER — Telehealth (INDEPENDENT_AMBULATORY_CARE_PROVIDER_SITE_OTHER): Payer: Self-pay | Admitting: Orthopedic Surgery

## 2017-12-14 ENCOUNTER — Telehealth: Payer: Self-pay | Admitting: Vascular Surgery

## 2017-12-14 NOTE — Telephone Encounter (Signed)
-----   Message from Mena Goes, RN sent at 12/14/2017 10:44 AM EST ----- Regarding: Move up her May Appts w/ [facility] and [facility]    ----- Message ----- From: Angelia Mould, MD Sent: 12/14/2017   5:13 AM To: Vvs Charge Pool Subject: office visit                                   This patient was in the emergency department last night with a chronic wound on the left foot.  I was asked to simply set up a follow-up visit as an outpatient with Dr. Servando Snare. Thank you This could be in the next 2 weeks.

## 2017-12-14 NOTE — Telephone Encounter (Signed)
Resched lab to 12/22/17 at 2:00 and MD to 12/30/17 at 8:30. Lm on hm# to inform pt of new appts.

## 2017-12-14 NOTE — Telephone Encounter (Signed)
Patient called advised she just got out of the hospital yesterday and need to schedule an appointment as soon as possible with Dr. Sharol Given. Patient advised she has an infection in her bone. The  Number to contact patient is [phone]

## 2017-12-14 NOTE — Telephone Encounter (Signed)
Can we open a spot for this pt for tomorrow afternoon or Friday?

## 2017-12-15 ENCOUNTER — Ambulatory Visit (INDEPENDENT_AMBULATORY_CARE_PROVIDER_SITE_OTHER): Payer: Medicaid Other | Admitting: Orthopedic Surgery

## 2017-12-15 ENCOUNTER — Encounter (INDEPENDENT_AMBULATORY_CARE_PROVIDER_SITE_OTHER): Payer: Self-pay | Admitting: Orthopedic Surgery

## 2017-12-15 VITALS — Ht 69.0 in | Wt 150.0 lb

## 2017-12-15 DIAGNOSIS — M86272 Subacute osteomyelitis, left ankle and foot: Secondary | ICD-10-CM | POA: Diagnosis not present

## 2017-12-15 NOTE — Progress Notes (Signed)
Office Visit Note   Patient: Kathy Phelps           Date of Birth: Aug 10, 1981           MRN: [id number] Visit Date: 12/15/2017              Requested by: No referring provider defined for this encounter. PCP: Patient, No Pcp Per  Chief Complaint  Patient presents with  . Left Foot - Pain    Follow up ER 12/13/17      HPI: Patient is a 37 year old woman with type 2 diabetes who is status post stent placement for the left lower extremity with Dr. Gwenlyn Saran.  Patient recently is going to the emergency room for a chronic draining ulcer over the fifth metatarsal.  Radiographs were positive for osteomyelitis she is currently on Keflex 500 mg 4 times a day.  Assessment & Plan: Visit Diagnoses:  1. Subacute osteomyelitis, left ankle and foot (HCC)     Plan: Due to the destructive bony changes and good dorsalis pedis pulse we will plan for revision of the fifth ray amputation.  Plan for surgery next week as an outpatient.  Risks and benefits were discussed including risk of recurrent infection.  Patient states she understands wished to proceed.  Follow-Up Instructions: Return in about 2 weeks (around 12/29/2017).   Ortho Exam  Patient is alert, oriented, no adenopathy, well-dressed, normal affect, normal respiratory effort. Examination patient does not have a palpable dorsalis pedis or posterior tibial pulse.  A Doppler was used and patient has a strong biphasic dorsalis pedis and posterior tibial pulse with good macro circulation.  She has an ulcer over the fifth metatarsal with drainage the ulcer is 10 mm in diameter and 3 mm deep.  Radiographs are reviewed which shows destructive bony changes of the fifth metatarsal status post third fourth and fifth ray amputations with periosteal elevation consistent with osteomyelitis.  Imaging: No results found. No images are attached to the encounter.  Labs: Lab Results  Component Value Date   HGBA1C 11.8 (H) 02/02/2017   ESRSEDRATE 63 (H)  12/13/2017   ESRSEDRATE 72 (H) 09/02/2017   ESRSEDRATE 34 (H) 02/02/2017   CRP 3.5 (H) 09/02/2017   REPTSTATUS 09/07/2017 FINAL 09/02/2017   GRAMSTAIN  06/21/2013    FEW WBC PRESENT, PREDOMINANTLY PMN RARE SQUAMOUS EPITHELIAL CELLS PRESENT FEW GRAM POSITIVE COCCI IN PAIRS IN CLUSTERS Performed at [facility]  09/02/2017    NO GROWTH 5 DAYS Performed at [facility], [place] [address], [place], [place] [address]    LABORGA ESCHERICHIA COLI (A) 05/23/2016    @LABSALLVALUES (HGBA1)@  Body mass index is 22.15 kg/m.  Orders:  No orders of the defined types were placed in this encounter.  No orders of the defined types were placed in this encounter.    Procedures: No procedures performed  Clinical Data: No additional findings.  ROS:  All other systems negative, except as noted in the HPI. Review of Systems  Objective: Vital Signs: Ht 5\' 9"  (1.753 m)   Wt 150 lb (68 kg)   BMI 22.15 kg/m   Specialty Comments:  No specialty comments available.  PMFS History: Patient Active Problem List   Diagnosis Date Noted  . Surgical wound, non healing 09/02/2017  . Bipolar 1 disorder ([place]) 09/02/2017  . Nausea vomiting and diarrhea 08/11/2017  . Great toe pain, left 08/11/2017  . GERD (gastroesophageal reflux disease) 08/10/2017  . Anxiety 08/10/2017  . Edema   .  Cellulitis   . AKI (acute kidney injury) ([place]) 02/04/2017  . Cellulitis of left foot 02/02/2017  . Type II diabetes mellitus with renal manifestations, uncontrolled ([place]) 02/02/2017  . HLD (hyperlipidemia) 02/02/2017  . Tobacco abuse 02/02/2017  . Essential hypertension 02/02/2017  . Malnutrition of moderate degree 02/02/2017   Past Medical History:  Diagnosis Date  . Bipolar affective ([place])   . Complete miscarriage   . Depression   . Diabetes mellitus   . Hyperlipidemia   . Hypertension   . Pregnancy complication    HELP Syndrome  . Renal disorder   . Schizophrenia ([place])       Family History  Problem Relation Age of Onset  . Diabetes Mellitus II Mother   . Heart disease Mother   . Heart disease Father     Past Surgical History:  Procedure Laterality Date  . ABDOMINAL AORTOGRAM N/A 09/07/2017   Procedure: ABDOMINAL AORTOGRAM;  Surgeon: Waynetta Sandy, MD;  Location: [place] [facility];  Service: Cardiovascular;  Laterality: N/A;  . ABDOMINAL AORTOGRAM W/LOWER EXTREMITY N/A 02/03/2017   Procedure: Abdominal Aortogram w/Lower Extremity;  Surgeon: Waynetta Sandy, MD;  Location: [place] [facility];  Service: Cardiovascular;  Laterality: N/A;  . AMPUTATION Left 02/10/2017   Procedure: AMPUTATION TOES 3, 4 AND 5  LEFT FOOT;  Surgeon: Waynetta Sandy, MD;  Location: [place];  Service: Vascular;  Laterality: Left;  . CESAREAN SECTION    . CHOLECYSTECTOMY    . LOWER EXTREMITY ANGIOGRAPHY Bilateral 09/07/2017   Procedure: Lower Extremity Angiography;  Surgeon: Waynetta Sandy, MD;  Location: [place] [facility];  Service: Cardiovascular;  Laterality: Bilateral;  . PERIPHERAL VASCULAR ATHERECTOMY  02/03/2017   Procedure: Peripheral Vascular Atherectomy;  Surgeon: Waynetta Sandy, MD;  Location: [place] [facility];  Service: Cardiovascular;;  . PERIPHERAL VASCULAR BALLOON ANGIOPLASTY  02/03/2017   Procedure: Peripheral Vascular Balloon Angioplasty;  Surgeon: Waynetta Sandy, MD;  Location: [place] [facility];  Service: Cardiovascular;;  . PERIPHERAL VASCULAR INTERVENTION Left 09/07/2017   Procedure: PERIPHERAL VASCULAR INTERVENTION;  Surgeon: Waynetta Sandy, MD;  Location: [place] [facility];  Service: Cardiovascular;  Laterality: Left;  SFA/POPLITEAL  . TUBAL LIGATION     Social History   Occupational History  . Not on file  Tobacco Use  . Smoking status: Current Every Day Smoker    Packs/day: 0.50    Years: 28.00    Pack years: 14.00    Types: Cigarettes  . Smokeless tobacco: Never Used  Substance and  Sexual Activity  . Alcohol use: No  . Drug use: No  . Sexual activity: Yes    Birth control/protection: None

## 2017-12-20 ENCOUNTER — Encounter (HOSPITAL_COMMUNITY): Payer: Self-pay | Admitting: *Deleted

## 2017-12-20 ENCOUNTER — Other Ambulatory Visit (INDEPENDENT_AMBULATORY_CARE_PROVIDER_SITE_OTHER): Payer: Self-pay | Admitting: Orthopedic Surgery

## 2017-12-20 ENCOUNTER — Other Ambulatory Visit: Payer: Self-pay

## 2017-12-20 DIAGNOSIS — M86272 Subacute osteomyelitis, left ankle and foot: Secondary | ICD-10-CM

## 2017-12-20 NOTE — Progress Notes (Signed)
Pt denies SOB, chest pain, and being under the care of a cardiologist. Pt denies having a stress test and cardiac cath. Pt made aware to not take Metformin DOS, take 15 units of Lantus insulin at HS and 10 units DOS if blood glucose is > 70. If BG > 220 take half of correction dose (NOvolog SS). Pt made aware to check BG every 2 hours prior to arrival to hospital on DOS. Pt made aware to treat a BG < 70 with 4 glucose tabs or glucose gel or 4 ounces of apple or cranberry juice, wait 15 minutes after intervention to recheck BG, if BG remains < 70, call Short Stay unit to speak with a nurse. Pt verbalized understanding of all pre-op instructions. Anesthesia asked to review EKG.

## 2017-12-21 ENCOUNTER — Encounter (HOSPITAL_COMMUNITY)
Admission: RE | Disposition: A | Discharge status: Home / Self Care | Payer: Self-pay | Source: Ambulatory Visit | Attending: Orthopedic Surgery

## 2017-12-21 ENCOUNTER — Ambulatory Visit (HOSPITAL_COMMUNITY): Payer: Medicaid Other | Admitting: Emergency Medicine

## 2017-12-21 ENCOUNTER — Encounter (HOSPITAL_COMMUNITY): Payer: Self-pay | Admitting: Urology

## 2017-12-21 ENCOUNTER — Ambulatory Visit (HOSPITAL_COMMUNITY)
Admission: RE | Admit: 2017-12-21 | Discharge: 2017-12-21 | Disposition: A | Discharge status: Home / Self Care | Payer: Medicaid Other | Source: Ambulatory Visit | Attending: Orthopedic Surgery | Admitting: Orthopedic Surgery

## 2017-12-21 DIAGNOSIS — F209 Schizophrenia, unspecified: Secondary | ICD-10-CM | POA: Diagnosis not present

## 2017-12-21 DIAGNOSIS — F17210 Nicotine dependence, cigarettes, uncomplicated: Secondary | ICD-10-CM | POA: Diagnosis not present

## 2017-12-21 DIAGNOSIS — M868X7 Other osteomyelitis, ankle and foot: Secondary | ICD-10-CM | POA: Insufficient documentation

## 2017-12-21 DIAGNOSIS — E785 Hyperlipidemia, unspecified: Secondary | ICD-10-CM | POA: Insufficient documentation

## 2017-12-21 DIAGNOSIS — Z794 Long term (current) use of insulin: Secondary | ICD-10-CM | POA: Insufficient documentation

## 2017-12-21 DIAGNOSIS — Z8673 Personal history of transient ischemic attack (TIA), and cerebral infarction without residual deficits: Secondary | ICD-10-CM | POA: Insufficient documentation

## 2017-12-21 DIAGNOSIS — I10 Essential (primary) hypertension: Secondary | ICD-10-CM | POA: Insufficient documentation

## 2017-12-21 DIAGNOSIS — Z886 Allergy status to analgesic agent status: Secondary | ICD-10-CM | POA: Diagnosis not present

## 2017-12-21 DIAGNOSIS — F319 Bipolar disorder, unspecified: Secondary | ICD-10-CM | POA: Diagnosis not present

## 2017-12-21 DIAGNOSIS — Z885 Allergy status to narcotic agent status: Secondary | ICD-10-CM | POA: Insufficient documentation

## 2017-12-21 DIAGNOSIS — M86272 Subacute osteomyelitis, left ankle and foot: Secondary | ICD-10-CM

## 2017-12-21 DIAGNOSIS — Z79899 Other long term (current) drug therapy: Secondary | ICD-10-CM | POA: Insufficient documentation

## 2017-12-21 DIAGNOSIS — E11621 Type 2 diabetes mellitus with foot ulcer: Secondary | ICD-10-CM | POA: Insufficient documentation

## 2017-12-21 DIAGNOSIS — L97529 Non-pressure chronic ulcer of other part of left foot with unspecified severity: Secondary | ICD-10-CM | POA: Diagnosis not present

## 2017-12-21 DIAGNOSIS — K219 Gastro-esophageal reflux disease without esophagitis: Secondary | ICD-10-CM | POA: Insufficient documentation

## 2017-12-21 DIAGNOSIS — L03116 Cellulitis of left lower limb: Secondary | ICD-10-CM | POA: Diagnosis not present

## 2017-12-21 HISTORY — DX: Headache: R51

## 2017-12-21 HISTORY — DX: Osteomyelitis, unspecified: M86.9

## 2017-12-21 HISTORY — DX: Headache, unspecified: R51.9

## 2017-12-21 HISTORY — DX: Cerebral infarction, unspecified: I63.9

## 2017-12-21 HISTORY — DX: Gastro-esophageal reflux disease without esophagitis: K21.9

## 2017-12-21 HISTORY — PX: AMPUTATION: SHX166

## 2017-12-21 LAB — GLUCOSE, CAPILLARY
GLUCOSE-CAPILLARY: 219 mg/dL — AB (ref 65–99)
Glucose-Capillary: 262 mg/dL — ABNORMAL HIGH (ref 65–99)
Glucose-Capillary: 253 mg/dL — ABNORMAL HIGH (ref 65–99)

## 2017-12-21 LAB — HCG, SERUM, QUALITATIVE: PREG SERUM: NEGATIVE

## 2017-12-21 SURGERY — AMPUTATION, FOOT, RAY
Anesthesia: Monitor Anesthesia Care | Site: Foot | Laterality: Left

## 2017-12-21 MED ORDER — MIDAZOLAM HCL 2 MG/2ML IJ SOLN
INTRAMUSCULAR | Status: AC
  Filled 2017-12-21: qty 2

## 2017-12-21 MED ORDER — OXYCODONE-ACETAMINOPHEN 5-325 MG PO TABS
1.0000 | ORAL_TABLET | Freq: Once | ORAL | Status: AC
  Administered 2017-12-21: 1 via ORAL

## 2017-12-21 MED ORDER — OXYCODONE HCL 5 MG PO TABS
ORAL_TABLET | ORAL | Status: AC
  Administered 2017-12-21: 5 mg via ORAL
  Filled 2017-12-21: qty 1

## 2017-12-21 MED ORDER — PROPOFOL 10 MG/ML IV BOLUS
INTRAVENOUS | Status: DC | PRN
  Administered 2017-12-21: 40 mg via INTRAVENOUS

## 2017-12-21 MED ORDER — HYDROMORPHONE HCL 1 MG/ML IJ SOLN
INTRAMUSCULAR | Status: AC
  Administered 2017-12-21: 0.5 mg via INTRAVENOUS
  Filled 2017-12-21: qty 1

## 2017-12-21 MED ORDER — LIDOCAINE HCL (PF) 1 % IJ SOLN
INTRAMUSCULAR | Status: DC | PRN
  Administered 2017-12-21: 10 mL

## 2017-12-21 MED ORDER — FENTANYL CITRATE (PF) 100 MCG/2ML IJ SOLN
INTRAMUSCULAR | Status: AC
  Filled 2017-12-21: qty 2

## 2017-12-21 MED ORDER — PROMETHAZINE HCL 25 MG/ML IJ SOLN: 6.2500 mg | INTRAMUSCULAR | Status: DC | PRN

## 2017-12-21 MED ORDER — 0.9 % SODIUM CHLORIDE (POUR BTL) OPTIME
TOPICAL | Status: DC | PRN
  Administered 2017-12-21: 1000 mL

## 2017-12-21 MED ORDER — FENTANYL CITRATE (PF) 250 MCG/5ML IJ SOLN
INTRAMUSCULAR | Status: AC
  Filled 2017-12-21: qty 5

## 2017-12-21 MED ORDER — OXYCODONE-ACETAMINOPHEN 10-325 MG PO TABS: 1.0000 | ORAL_TABLET | ORAL | 0 refills | Status: DC | PRN

## 2017-12-21 MED ORDER — MIDAZOLAM HCL 5 MG/5ML IJ SOLN
INTRAMUSCULAR | Status: DC | PRN
  Administered 2017-12-21 (×2): 2 mg via INTRAVENOUS

## 2017-12-21 MED ORDER — CHLORHEXIDINE GLUCONATE 4 % EX LIQD: 60.0000 mL | Freq: Once | CUTANEOUS | Status: DC

## 2017-12-21 MED ORDER — HYDROMORPHONE HCL 1 MG/ML IJ SOLN
0.2500 mg | INTRAMUSCULAR | Status: DC | PRN
  Administered 2017-12-21 (×2): 0.5 mg via INTRAVENOUS

## 2017-12-21 MED ORDER — PROPOFOL 500 MG/50ML IV EMUL
INTRAVENOUS | Status: DC | PRN
  Administered 2017-12-21: 125 ug/kg/min via INTRAVENOUS

## 2017-12-21 MED ORDER — OXYCODONE HCL 5 MG PO TABS
5.0000 mg | ORAL_TABLET | Freq: Once | ORAL | Status: AC
  Administered 2017-12-21: 5 mg via ORAL

## 2017-12-21 MED ORDER — LIDOCAINE HCL (CARDIAC) 20 MG/ML IV SOLN
INTRAVENOUS | Status: DC | PRN
  Administered 2017-12-21: 50 mg via INTRAVENOUS

## 2017-12-21 MED ORDER — MIDAZOLAM HCL 2 MG/2ML IJ SOLN: 0.5000 mg | Freq: Once | INTRAMUSCULAR | Status: DC | PRN

## 2017-12-21 MED ORDER — CEFAZOLIN SODIUM-DEXTROSE 2-4 GM/100ML-% IV SOLN
INTRAVENOUS | Status: AC
  Filled 2017-12-21: qty 100

## 2017-12-21 MED ORDER — LIDOCAINE HCL 1 % IJ SOLN
INTRAMUSCULAR | Status: AC
  Filled 2017-12-21: qty 20

## 2017-12-21 MED ORDER — MEPERIDINE HCL 50 MG/ML IJ SOLN: 6.2500 mg | INTRAMUSCULAR | Status: DC | PRN

## 2017-12-21 MED ORDER — INSULIN ASPART 100 UNIT/ML ~~LOC~~ SOLN
5.0000 [IU] | Freq: Once | SUBCUTANEOUS | Status: AC
  Administered 2017-12-21: 5 [IU] via SUBCUTANEOUS

## 2017-12-21 MED ORDER — CEFAZOLIN SODIUM-DEXTROSE 2-4 GM/100ML-% IV SOLN: 2.0000 g | INTRAVENOUS | Status: DC

## 2017-12-21 MED ORDER — INSULIN ASPART 100 UNIT/ML ~~LOC~~ SOLN
SUBCUTANEOUS | Status: AC
  Filled 2017-12-21: qty 1

## 2017-12-21 MED ORDER — OXYCODONE-ACETAMINOPHEN 5-325 MG PO TABS
ORAL_TABLET | ORAL | Status: AC
  Administered 2017-12-21: 1 via ORAL
  Filled 2017-12-21: qty 1

## 2017-12-21 MED ORDER — PROPOFOL 10 MG/ML IV BOLUS
INTRAVENOUS | Status: AC
  Filled 2017-12-21: qty 20

## 2017-12-21 MED ORDER — LACTATED RINGERS IV SOLN
INTRAVENOUS | Status: DC
  Administered 2017-12-21 (×2): via INTRAVENOUS

## 2017-12-21 MED ORDER — FENTANYL CITRATE (PF) 100 MCG/2ML IJ SOLN
INTRAMUSCULAR | Status: DC | PRN
  Administered 2017-12-21: 100 ug via INTRAVENOUS

## 2017-12-21 SURGICAL SUPPLY — 31 items
BLADE SAW SGTL MED 73X18.5 STR (BLADE) IMPLANT
BLADE SURG 21 STRL SS (BLADE) ×3 IMPLANT
BNDG COHESIVE 4X5 TAN STRL (GAUZE/BANDAGES/DRESSINGS) ×3 IMPLANT
BNDG GAUZE ELAST 4 BULKY (GAUZE/BANDAGES/DRESSINGS) ×3 IMPLANT
COVER SURGICAL LIGHT HANDLE (MISCELLANEOUS) ×6 IMPLANT
DRAPE U-SHAPE 47X51 STRL (DRAPES) ×6 IMPLANT
DRSG ADAPTIC 3X8 NADH LF (GAUZE/BANDAGES/DRESSINGS) ×3 IMPLANT
DRSG PAD ABDOMINAL 8X10 ST (GAUZE/BANDAGES/DRESSINGS) ×6 IMPLANT
DURAPREP 26ML APPLICATOR (WOUND CARE) ×3 IMPLANT
ELECT REM PT RETURN 9FT ADLT (ELECTROSURGICAL) ×3
ELECTRODE REM PT RTRN 9FT ADLT (ELECTROSURGICAL) ×1 IMPLANT
GAUZE SPONGE 4X4 12PLY STRL (GAUZE/BANDAGES/DRESSINGS) ×3 IMPLANT
GLOVE BIOGEL PI IND STRL 9 (GLOVE) ×1 IMPLANT
GLOVE BIOGEL PI INDICATOR 9 (GLOVE) ×2
GLOVE SURG ORTHO 9.0 STRL STRW (GLOVE) ×3 IMPLANT
GOWN STRL REUS W/ TWL XL LVL3 (GOWN DISPOSABLE) ×2 IMPLANT
GOWN STRL REUS W/TWL XL LVL3 (GOWN DISPOSABLE) ×6
KIT BASIN OR (CUSTOM PROCEDURE TRAY) ×3 IMPLANT
KIT DRSG PREVENA PLUS 7DAY 125 (MISCELLANEOUS) ×3 IMPLANT
KIT ROOM TURNOVER OR (KITS) ×3 IMPLANT
NEEDLE HYPO 25X1 1.5 SAFETY (NEEDLE) ×3 IMPLANT
NS IRRIG 1000ML POUR BTL (IV SOLUTION) ×3 IMPLANT
PACK ORTHO EXTREMITY (CUSTOM PROCEDURE TRAY) ×3 IMPLANT
PAD ARMBOARD 7.5X6 YLW CONV (MISCELLANEOUS) ×6 IMPLANT
STOCKINETTE IMPERVIOUS LG (DRAPES) IMPLANT
SUT ETHILON 2 0 PSLX (SUTURE) ×3 IMPLANT
SYR CONTROL 10ML LL (SYRINGE) ×3 IMPLANT
TOWEL OR 17X26 10 PK STRL BLUE (TOWEL DISPOSABLE) ×3 IMPLANT
TUBE CONNECTING 12'X1/4 (SUCTIONS) ×1
TUBE CONNECTING 12X1/4 (SUCTIONS) ×2 IMPLANT
YANKAUER SUCT BULB TIP NO VENT (SUCTIONS) ×3 IMPLANT

## 2017-12-21 NOTE — H&P (Signed)
Kathy Phelps is an 37 y.o. female.   Chief Complaint: Ulceration osteomyelitis left foot  fifth metatarsal HPI: Patient is a 37 year old woman with type 2 diabetes who is status post stent placement for the left lower extremity with Dr. Donzetta Matters.  Patient recently is going to the emergency room for a chronic draining ulcer over the fifth metatarsal.  Radiographs were positive for osteomyelitis she is currently on Keflex 500 mg 4 times a day.    Past Medical History:  Diagnosis Date  . Bipolar affective ([place])   . Complete miscarriage   . Depression   . Diabetes mellitus   . GERD (gastroesophageal reflux disease)   . Headache   . Hyperlipidemia   . Hypertension   . Osteomyelitis ([place])    left foot  . Pregnancy complication    HELP Syndrome  . Renal disorder   . Schizophrenia ([place])   . Stroke [facility])    " mild stroke"    Past Surgical History:  Procedure Laterality Date  . ABDOMINAL AORTOGRAM N/A 09/07/2017   Procedure: ABDOMINAL AORTOGRAM;  Surgeon: Waynetta Sandy, MD;  Location: [place] [facility];  Service: Cardiovascular;  Laterality: N/A;  . ABDOMINAL AORTOGRAM W/LOWER EXTREMITY N/A 02/03/2017   Procedure: Abdominal Aortogram w/Lower Extremity;  Surgeon: Waynetta Sandy, MD;  Location: [place] [facility];  Service: Cardiovascular;  Laterality: N/A;  . AMPUTATION Left 02/10/2017   Procedure: AMPUTATION TOES 3, 4 AND 5  LEFT FOOT;  Surgeon: Waynetta Sandy, MD;  Location: [place];  Service: Vascular;  Laterality: Left;  . CESAREAN SECTION    . CHOLECYSTECTOMY    . LOWER EXTREMITY ANGIOGRAPHY Bilateral 09/07/2017   Procedure: Lower Extremity Angiography;  Surgeon: Waynetta Sandy, MD;  Location: [place] [facility];  Service: Cardiovascular;  Laterality: Bilateral;  . PERIPHERAL VASCULAR ATHERECTOMY  02/03/2017   Procedure: Peripheral Vascular Atherectomy;  Surgeon: Waynetta Sandy, MD;  Location: [place] [facility];  Service:  Cardiovascular;;  . PERIPHERAL VASCULAR BALLOON ANGIOPLASTY  02/03/2017   Procedure: Peripheral Vascular Balloon Angioplasty;  Surgeon: Waynetta Sandy, MD;  Location: [place] [facility];  Service: Cardiovascular;;  . PERIPHERAL VASCULAR INTERVENTION Left 09/07/2017   Procedure: PERIPHERAL VASCULAR INTERVENTION;  Surgeon: Waynetta Sandy, MD;  Location: [place] [facility];  Service: Cardiovascular;  Laterality: Left;  SFA/POPLITEAL  . TUBAL LIGATION      Family History  Problem Relation Age of Onset  . Diabetes Mellitus II Mother   . Heart disease Mother   . Heart disease Father    Social History:  reports that she has been smoking cigarettes.  She has a 7.00 pack-year smoking history. she has never used smokeless tobacco. She reports that she does not drink alcohol or use drugs.  Allergies:  Allergies  Allergen Reactions  . Hydrocodone-Acetaminophen Hives  . Tylenol [Acetaminophen] Itching and Swelling    SWELLING REACTION UNSPECIFIED     No medications prior to admission.    No results found for this or any previous visit (from the past 48 hour(s)). No results found.  Review of Systems  All other systems reviewed and are negative.   Last menstrual period 11/25/2017. Physical Exam  Patient is alert, oriented, no adenopathy, well-dressed, normal affect, normal respiratory effort. Examination patient does not have a palpable dorsalis pedis or posterior tibial pulse.  A Doppler was used and patient has a strong biphasic dorsalis pedis and posterior tibial pulse with good macro circulation.  She has an  ulcer over the fifth metatarsal with drainage the ulcer is 10 mm in diameter and 3 mm deep.  Radiographs are reviewed which shows destructive bony changes of the fifth metatarsal status post third fourth and fifth ray amputations with periosteal elevation consistent with osteomyelitis.   Assessment/Plan 1. Subacute osteomyelitis, left ankle and foot (HCC)      Plan: Due to the destructive bony changes and good dorsalis pedis pulse we will plan for revision of the fifth ray amputation.  Plan for surgery next week as an outpatient.  Risks and benefits were discussed including risk of recurrent infection.  Patient states she understands wished to proceed.     Kathy Minion, MD 12/21/2017, 6:33 AM

## 2017-12-21 NOTE — Anesthesia Postprocedure Evaluation (Signed)
Anesthesia Post Note  Patient: Kathy Phelps  Procedure(s) Performed: LEFT FOOT 5TH RAY AMPUTATION (Left Foot)     Patient location during evaluation: PACU Anesthesia Type: MAC Level of consciousness: awake and alert, patient cooperative and oriented Pain management: pain level controlled Vital Signs Assessment: post-procedure vital signs reviewed and stable Respiratory status: spontaneous breathing, nonlabored ventilation and respiratory function stable Cardiovascular status: blood pressure returned to baseline and stable Postop Assessment: no apparent nausea or vomiting and adequate PO intake Anesthetic complications: no    Last Vitals:  Vitals:   12/21/17 1145 12/21/17 1200  BP: (!) 163/97 (!) 149/91  Pulse: 88 89  Resp:  18  Temp:  (!) 36.1 C  SpO2: 100% 100%    Last Pain:  Vitals:   12/21/17 1045  TempSrc:   PainSc: Asleep                 Livian Vanderbeck,E. Carsin Randazzo

## 2017-12-21 NOTE — Progress Notes (Signed)
Orthopedic Tech Progress Note Patient Details:  Kathy Phelps [birth year] [id number]  Ortho Devices Type of Ortho Device: Crutches, Postop shoe/boot Ortho Device/Splint Location: lle Ortho Device/Splint Interventions: Application   Post Interventions Patient Tolerated: Well Instructions Provided: Care of device   Hildred Priest 12/21/2017, 12:33 PM Viewed order from doctor's order list

## 2017-12-21 NOTE — Progress Notes (Signed)
Orthopedic Tech Progress Note Patient Details:  Kathy Phelps 11/12/1980 [id number]  Patient ID: Kathy Phelps, female   DOB: 1980-11-18, 37 y.o.   MRN: [id number]   Kathy Phelps 12/21/2017, 12:34 PM Crutches; post \\op  shoe

## 2017-12-21 NOTE — Progress Notes (Signed)
Orthopedic Tech Progress Note Patient Details:  Kathy Phelps Jun 17, 1981 [id number]  Ortho Devices Type of Ortho Device: Crutches, Postop shoe/boot Ortho Device/Splint Location: lle Ortho Device/Splint Interventions: Application   Post Interventions Patient Tolerated: Well Instructions Provided: Care of device   Hildred Priest 12/21/2017, 12:33 PM Viewed order from doctor's order list

## 2017-12-21 NOTE — Op Note (Signed)
12/21/2017  10:37 AM  PATIENT:  [name]    PRE-OPERATIVE DIAGNOSIS:  Osteomyelitis Left 5th Metatarsal  POST-OPERATIVE DIAGNOSIS:  Same  PROCEDURE:  LEFT FOOT 5TH RAY AMPUTATION Local tissue rearrangement for wound closure 7 x 3 cm. Application of Praveena wound VAC.  SURGEON:  Newt Minion, MD  PHYSICIAN ASSISTANT:None ANESTHESIA:   General  PREOPERATIVE INDICATIONS:  Kathy Phelps is a  38 y.o. female with a diagnosis of Osteomyelitis Left 5th Metatarsal who failed conservative measures and elected for surgical management.    The risks benefits and alternatives were discussed with the patient preoperatively including but not limited to the risks of infection, bleeding, nerve injury, cardiopulmonary complications, the need for revision surgery, among others, and the patient was willing to proceed.  OPERATIVE IMPLANTS: Praveena wound VAC  _0 @  OPERATIVE FINDINGS: Minimal petechial bleeding no deep abscess destructive bony changes consistent with osteomyelitis of the fifth metatarsal.  OPERATIVE PROCEDURE: Patient was brought the operating room and underwent MAC anesthetic.  After adequate levels of anesthesia were obtained patient's left lower extremity was prepped using DuraPrep draped into a sterile field a timeout was called.  A elliptical incision was made around the ulcer to leave a wound that was 7 x 3 cm.  The fifth metatarsal was resected a very small portion of the metatarsal was left in place to allow for attachment of the peroneus brevis.  There is no visible signs of infection and the remaining bone.  The destructive bone was excised with the ulcerative tissue there is no deep abscess no necrotic tissue.  The wound was irrigated with normal saline.  Local tissue rearrangement was used to close the wound 3 x 7 cm.  A Praveena wound VAC was applied patient was taken the PACU in stable condition.  Patient had the surgical incision locally reinforced with 10 cc  of 1% lidocaine plain.   DISCHARGE PLANNING:  Antibiotic duration: Perioperative Kefzol  Weightbearing: Touchdown weightbearing on the left  Pain medication: Percocet 10 mg tablets  Dressing care/ Wound VAC: Wound VAC to remain in place for 1 week  Ambulatory devices: Walker or crutches  Discharge to: Home, family states they can care for her at home.  Follow-up: In the office 1 week post operative.

## 2017-12-21 NOTE — Progress Notes (Signed)
CBG 262, pt reports this is "good" for her, and her Blood sugar is normally in the 600s. BP 157/103 pt took her lopressor this AM. Dr. Glennon Mac notified.

## 2017-12-21 NOTE — Transfer of Care (Signed)
Immediate Anesthesia Transfer of Care Note  Patient: Kathy Phelps  Procedure(s) Performed: LEFT FOOT 5TH RAY AMPUTATION (Left Foot)  Patient Location: PACU  Anesthesia Type:MAC  Level of Consciousness: drowsy, patient cooperative and responds to stimulation  Airway & Oxygen Therapy: Patient Spontanous Breathing and Patient connected to face mask oxygen  Post-op Assessment: Report given to RN and Post -op Vital signs reviewed and stable  Post vital signs: Reviewed and stable  Last Vitals:  Vitals:   12/21/17 0914 12/21/17 0917  BP: (!) 161/104 (!) 157/103  Pulse: 93   Resp: 18   Temp: 36.8 C   SpO2: 100%     Last Pain:  Vitals:   12/21/17 0914  TempSrc: Oral  PainSc:       Patients Stated Pain Goal: 5 (76/54/65 0354)  Complications: No apparent anesthesia complications

## 2017-12-21 NOTE — Anesthesia Preprocedure Evaluation (Addendum)
Anesthesia Evaluation  Patient identified by MRN, date of birth, ID band Patient awake    Reviewed: Allergy & Precautions, NPO status , Patient's Chart, lab work & pertinent test results, reviewed documented beta blocker date and time   History of Anesthesia Complications Negative for: history of anesthetic complications  Airway Mallampati: I  TM Distance: >3 FB Neck ROM: Full    Dental  (+) Poor Dentition, Loose, Missing, Chipped, Dental Advisory Given   Pulmonary COPD,  COPD inhaler, Current Smoker,    breath sounds clear to auscultation       Cardiovascular hypertension, Pt. on home beta blockers and Pt. on medications (-) angina Rhythm:Regular Rate:Normal  '18 ECHO: EF 50-55%, valves OK   Neuro/Psych  Headaches, Anxiety Depression Bipolar Disorder Schizophrenia CVA, No Residual Symptoms    GI/Hepatic Neg liver ROS, GERD  Medicated and Poorly Controlled,  Endo/Other  diabetes (glu 262), Insulin Dependent, Oral Hypoglycemic Agents  Renal/GU Renal InsufficiencyRenal disease (creat 1.48)     Musculoskeletal   Abdominal   Peds  Hematology  (+) Blood dyscrasia (Hb 10.2), anemia ,   Anesthesia Other Findings   Reproductive/Obstetrics                            Anesthesia Physical Anesthesia Plan  ASA: III  Anesthesia Plan: MAC   Post-op Pain Management:    Induction:   PONV Risk Score and Plan: 1 and Ondansetron  Airway Management Planned: Natural Airway and Simple Face Mask  Additional Equipment:   Intra-op Plan:   Post-operative Plan:   Informed Consent: I have reviewed the patients History and Physical, chart, labs and discussed the procedure including the risks, benefits and alternatives for the proposed anesthesia with the patient or authorized representative who has indicated his/her understanding and acceptance.   Dental advisory given  Plan Discussed with: CRNA and  Surgeon  Anesthesia Plan Comments: (Plan routine monitors, MAC)        Anesthesia Quick Evaluation

## 2017-12-22 ENCOUNTER — Encounter (HOSPITAL_COMMUNITY): Payer: Medicaid Other

## 2017-12-22 ENCOUNTER — Encounter (HOSPITAL_COMMUNITY): Payer: Self-pay | Admitting: Orthopedic Surgery

## 2017-12-22 ENCOUNTER — Ambulatory Visit (INDEPENDENT_AMBULATORY_CARE_PROVIDER_SITE_OTHER): Payer: Medicaid Other | Admitting: Orthopedic Surgery

## 2017-12-22 ENCOUNTER — Telehealth (INDEPENDENT_AMBULATORY_CARE_PROVIDER_SITE_OTHER): Payer: Self-pay | Admitting: *Deleted

## 2017-12-22 VITALS — Ht 69.0 in | Wt 150.0 lb

## 2017-12-22 DIAGNOSIS — M86272 Subacute osteomyelitis, left ankle and foot: Secondary | ICD-10-CM

## 2017-12-22 MED ORDER — OXYCODONE-ACETAMINOPHEN 10-325 MG PO TABS: 1.0000 | ORAL_TABLET | ORAL | 0 refills | Status: DC | PRN

## 2017-12-22 NOTE — Progress Notes (Signed)
Office Visit Note   Patient: Kathy Phelps           Date of Birth: [birth year]           MRN: [id number] Visit Date: 12/22/2017              Requested by: No referring provider defined for this encounter. PCP: Patient, No Pcp Per  Chief Complaint  Patient presents with  . Left Foot - Routine Post Op    12/21/17 Left foot 5th ray amputation.      HPI: Patient is a 37 year old woman is today status post fifth ray amputation.  Patient states that the wound VAC alarms.  Assessment & Plan: Visit Diagnoses:  1. Subacute osteomyelitis, left ankle and foot ([place])     Plan: The wound VAC was removed there is maceration the wound edges are well approximated.  Discussed the importance of nonweightbearing and elevation to decrease swelling.  Patient was given a new prescription for Percocet No. 20 tablets.  Follow-Up Instructions: Return in about 1 week (around 12/29/2017).   Ortho Exam  Patient is alert, oriented, no adenopathy, well-dressed, normal affect, normal respiratory effort. Examination there is a mild maceration of the wound edges are well approximated there is a small amount of serosanguineous drainage.  Patient does have swelling in the importance of elevation and nonweightbearing was discussed.  Discussed that if she develops too much swelling or put weight on her foot the wound could dehisce and we would need to proceed with additional surgery.  Imaging: No results found. No images are attached to the encounter.  Labs: Lab Results  Component Value Date   HGBA1C 11.8 (H) 02/02/2017   ESRSEDRATE 63 (H) 12/13/2017   ESRSEDRATE 72 (H) 09/02/2017   ESRSEDRATE 34 (H) 02/02/2017   CRP 3.5 (H) 09/02/2017   REPTSTATUS 09/07/2017 FINAL 09/02/2017   GRAMSTAIN  06/21/2013    FEW WBC PRESENT, PREDOMINANTLY PMN RARE SQUAMOUS EPITHELIAL CELLS PRESENT FEW GRAM POSITIVE COCCI IN PAIRS IN CLUSTERS Performed at [facility]  09/02/2017    NO GROWTH 5  DAYS Performed at [facility], [place] [address] [place], [place] [address]    LABORGA ESCHERICHIA COLI (A) 05/23/2016    @LABSALLVALUES (HGBA1)@  Body mass index is 22.15 kg/m.  Orders:  No orders of the defined types were placed in this encounter.  Meds ordered this encounter  Medications  . oxyCODONE-acetaminophen (PERCOCET) 10-325 MG tablet    Sig: Take 1 tablet by mouth every 4 (four) hours as needed for pain.    Dispense:  20 tablet    Refill:  0     Procedures: No procedures performed  Clinical Data: No additional findings.  ROS:  All other systems negative, except as noted in the HPI. Review of Systems  Objective: Vital Signs: Ht 5\' 9"  (1.753 m)   Wt 150 lb (68 kg)   LMP 11/25/2017 (Exact Date)   BMI 22.15 kg/m   Specialty Comments:  No specialty comments available.  PMFS History: Patient Active Problem List   Diagnosis Date Noted  . Subacute osteomyelitis, left ankle and foot ([place])   . Surgical wound, non healing 09/02/2017  . Bipolar 1 disorder ([place]) 09/02/2017  . Nausea vomiting and diarrhea 08/11/2017  . Great toe pain, left 08/11/2017  . GERD (gastroesophageal reflux disease) 08/10/2017  . Anxiety 08/10/2017  . Edema   . Cellulitis   . AKI (acute kidney injury) ([place]) 02/04/2017  . Cellulitis of  left foot 02/02/2017  . Type II diabetes mellitus with renal manifestations, uncontrolled ([place]) 02/02/2017  . HLD (hyperlipidemia) 02/02/2017  . Tobacco abuse 02/02/2017  . Essential hypertension 02/02/2017  . Malnutrition of moderate degree 02/02/2017   Past Medical History:  Diagnosis Date  . Bipolar affective ([place])   . Complete miscarriage   . Depression   . Diabetes mellitus   . GERD (gastroesophageal reflux disease)   . Headache   . Hyperlipidemia   . Hypertension   . Osteomyelitis ([place])    left foot  . Pregnancy complication    HELP Syndrome  . Renal disorder   . Schizophrenia ([place])   . Stroke [facility])    " mild stroke"     Family History  Problem Relation Age of Onset  . Diabetes Mellitus II Mother   . Heart disease Mother   . Heart disease Father     Past Surgical History:  Procedure Laterality Date  . ABDOMINAL AORTOGRAM N/A 09/07/2017   Procedure: ABDOMINAL AORTOGRAM;  Surgeon: Waynetta Sandy, MD;  Location: [place] [facility];  Service: Cardiovascular;  Laterality: N/A;  . ABDOMINAL AORTOGRAM W/LOWER EXTREMITY N/A 02/03/2017   Procedure: Abdominal Aortogram w/Lower Extremity;  Surgeon: Waynetta Sandy, MD;  Location: [place] [facility];  Service: Cardiovascular;  Laterality: N/A;  . AMPUTATION Left 02/10/2017   Procedure: AMPUTATION TOES 3, 4 AND 5  LEFT FOOT;  Surgeon: Waynetta Sandy, MD;  Location: [place];  Service: Vascular;  Laterality: Left;  . AMPUTATION Left 12/21/2017   Procedure: LEFT FOOT 5TH RAY AMPUTATION;  Surgeon: Newt Minion, MD;  Location: [place];  Service: Orthopedics;  Laterality: Left;  . CESAREAN SECTION    . CHOLECYSTECTOMY    . LOWER EXTREMITY ANGIOGRAPHY Bilateral 09/07/2017   Procedure: Lower Extremity Angiography;  Surgeon: Waynetta Sandy, MD;  Location: [place] [facility];  Service: Cardiovascular;  Laterality: Bilateral;  . PERIPHERAL VASCULAR ATHERECTOMY  02/03/2017   Procedure: Peripheral Vascular Atherectomy;  Surgeon: Waynetta Sandy, MD;  Location: [place] [facility];  Service: Cardiovascular;;  . PERIPHERAL VASCULAR BALLOON ANGIOPLASTY  02/03/2017   Procedure: Peripheral Vascular Balloon Angioplasty;  Surgeon: Waynetta Sandy, MD;  Location: [place] [facility];  Service: Cardiovascular;;  . PERIPHERAL VASCULAR INTERVENTION Left 09/07/2017   Procedure: PERIPHERAL VASCULAR INTERVENTION;  Surgeon: Waynetta Sandy, MD;  Location: [place] [facility];  Service: Cardiovascular;  Laterality: Left;  SFA/POPLITEAL  . TUBAL LIGATION     Social History   Occupational History  . Not on file  Tobacco Use  . Smoking  status: Current Every Day Smoker    Packs/day: 0.25    Years: 28.00    Pack years: 7.00    Types: Cigarettes  . Smokeless tobacco: Never Used  Substance and Sexual Activity  . Alcohol use: No  . Drug use: No  . Sexual activity: Yes    Birth control/protection: None

## 2017-12-22 NOTE — Telephone Encounter (Signed)
Pt called on Triage phone stating she had amputation of left 5th toe and wants to know how she suppose to change the sponge dressing when none were given to her or was even taught on what to do. Also, states her machine is beeping as if it is leaking and doesn't see where there is any leakage.   Pt would like a call back ASAP. CB [phone]

## 2017-12-22 NOTE — Telephone Encounter (Signed)
Called pt. She will come in today at 2:00 and we will remove the wound vac.

## 2017-12-26 ENCOUNTER — Telehealth (INDEPENDENT_AMBULATORY_CARE_PROVIDER_SITE_OTHER): Payer: Self-pay

## 2017-12-26 NOTE — Telephone Encounter (Signed)
-----   Message from Pamella Pert, [place] sent at 12/22/2017  2:10 PM EDT ----- Regarding: [place] tracks Need to follow up [place] tracks prior auth for [employer]

## 2017-12-26 NOTE — Telephone Encounter (Signed)
Called [facility] [place] location and advised of approval and called pt lm on vm to advise. To call with any questions.

## 2017-12-26 NOTE — Telephone Encounter (Signed)
[place] tracks provided approval for her Percocet. Need to call pharm to advise and follow up with pt

## 2017-12-29 ENCOUNTER — Ambulatory Visit (INDEPENDENT_AMBULATORY_CARE_PROVIDER_SITE_OTHER): Payer: Medicaid Other | Admitting: Orthopedic Surgery

## 2017-12-30 ENCOUNTER — Ambulatory Visit: Payer: Medicaid Other | Admitting: Vascular Surgery

## 2018-01-02 ENCOUNTER — Ambulatory Visit (INDEPENDENT_AMBULATORY_CARE_PROVIDER_SITE_OTHER): Payer: Medicaid Other | Admitting: Orthopedic Surgery

## 2018-01-02 ENCOUNTER — Encounter (INDEPENDENT_AMBULATORY_CARE_PROVIDER_SITE_OTHER): Payer: Self-pay | Admitting: Orthopedic Surgery

## 2018-01-02 VITALS — Ht 69.0 in | Wt 150.0 lb

## 2018-01-02 DIAGNOSIS — S98132A Complete traumatic amputation of one left lesser toe, initial encounter: Secondary | ICD-10-CM

## 2018-01-02 DIAGNOSIS — M6702 Short Achilles tendon (acquired), left ankle: Secondary | ICD-10-CM

## 2018-01-02 DIAGNOSIS — Z89422 Acquired absence of other left toe(s): Secondary | ICD-10-CM

## 2018-01-02 NOTE — Progress Notes (Signed)
Office Visit Note   Patient: Kathy Phelps           Date of Birth: 1981-07-29           MRN: [id number] Visit Date: 01/02/2018              Requested by: No referring provider defined for this encounter. PCP: Patient, No Pcp Per  Chief Complaint  Patient presents with  . Left Foot - Routine Post Op    12/21/17 left foot 5th ray amputation       HPI: Patient presents in follow-up status post lateral column amputation left foot.  She is developing progressive contracture of the Achilles.  Patient states that Medicaid will not pay for her pain medication.  Assessment & Plan: Visit Diagnoses:  1. Amputated toe of left foot ([place])   2. Achilles tendon contracture, left     Plan: Recommended heel cord stretching the importance of heel cord stretching was discussed.  Discussed that without heel cord stretching she will be unable to ambulate on this foot.  Continue with Dial soap cleansing daily dry dressing change daily and will follow-up in 1 week to harvest the sutures.  Follow-Up Instructions: Return in about 1 week (around 01/09/2018).   Ortho Exam  Patient is alert, oriented, no adenopathy, well-dressed, normal affect, normal respiratory effort. Examination incision is well approximated there is no redness no cellulitis no wound dehiscence no signs of infection there is no drainage.  Patient is developing a progressive equinus contracture with dorsiflexion 30 degrees short of neutral.  Patient complains of pain radiating from her foot up to her thigh.  Imaging: No results found. No images are attached to the encounter.  Labs: Lab Results  Component Value Date   HGBA1C 11.8 (H) 02/02/2017   ESRSEDRATE 63 (H) 12/13/2017   ESRSEDRATE 72 (H) 09/02/2017   ESRSEDRATE 34 (H) 02/02/2017   CRP 3.5 (H) 09/02/2017   REPTSTATUS 09/07/2017 FINAL 09/02/2017   GRAMSTAIN  06/21/2013    FEW WBC PRESENT, PREDOMINANTLY PMN RARE SQUAMOUS EPITHELIAL CELLS PRESENT FEW GRAM POSITIVE COCCI  IN PAIRS IN CLUSTERS Performed at [facility]  09/02/2017    NO GROWTH 5 DAYS Performed at [facility], [place] [address] [place], [place] [address]    LABORGA ESCHERICHIA COLI (A) 05/23/2016    @LABSALLVALUES (HGBA1)@  Body mass index is 22.15 kg/m.  Orders:  No orders of the defined types were placed in this encounter.  No orders of the defined types were placed in this encounter.    Procedures: No procedures performed  Clinical Data: No additional findings.  ROS:  All other systems negative, except as noted in the HPI. Review of Systems  Objective: Vital Signs: Ht 5\' 9"  (1.753 m)   Wt 150 lb (68 kg)   BMI 22.15 kg/m   Specialty Comments:  No specialty comments available.  PMFS History: Patient Active Problem List   Diagnosis Date Noted  . Subacute osteomyelitis, left ankle and foot ([place])   . Surgical wound, non healing 09/02/2017  . Bipolar 1 disorder ([place]) 09/02/2017  . Nausea vomiting and diarrhea 08/11/2017  . Great toe pain, left 08/11/2017  . GERD (gastroesophageal reflux disease) 08/10/2017  . Anxiety 08/10/2017  . Edema   . Cellulitis   . AKI (acute kidney injury) ([place]) 02/04/2017  . Cellulitis of left foot 02/02/2017  . Type II diabetes mellitus with renal manifestations, uncontrolled ([place]) 02/02/2017  . HLD (hyperlipidemia) 02/02/2017  .  Tobacco abuse 02/02/2017  . Essential hypertension 02/02/2017  . Malnutrition of moderate degree 02/02/2017   Past Medical History:  Diagnosis Date  . Bipolar affective ([place])   . Complete miscarriage   . Depression   . Diabetes mellitus   . GERD (gastroesophageal reflux disease)   . Headache   . Hyperlipidemia   . Hypertension   . Osteomyelitis ([place])    left foot  . Pregnancy complication    HELP Syndrome  . Renal disorder   . Schizophrenia ([place])   . Stroke [facility])    " mild stroke"    Family History  Problem Relation Age of Onset  . Diabetes Mellitus II Mother   . Heart  disease Mother   . Heart disease Father     Past Surgical History:  Procedure Laterality Date  . ABDOMINAL AORTOGRAM N/A 09/07/2017   Procedure: ABDOMINAL AORTOGRAM;  Surgeon: Waynetta Sandy, MD;  Location: [facility];  Service: Cardiovascular;  Laterality: N/A;  . ABDOMINAL AORTOGRAM W/LOWER EXTREMITY N/A 02/03/2017   Procedure: Abdominal Aortogram w/Lower Extremity;  Surgeon: Waynetta Sandy, MD;  Location: [place] [facility];  Service: Cardiovascular;  Laterality: N/A;  . AMPUTATION Left 02/10/2017   Procedure: AMPUTATION TOES 3, 4 AND 5  LEFT FOOT;  Surgeon: Waynetta Sandy, MD;  Location: [place];  Service: Vascular;  Laterality: Left;  . AMPUTATION Left 12/21/2017   Procedure: LEFT FOOT 5TH RAY AMPUTATION;  Surgeon: Newt Minion, MD;  Location: [facility];  Service: Orthopedics;  Laterality: Left;  . CESAREAN SECTION    . CHOLECYSTECTOMY    . LOWER EXTREMITY ANGIOGRAPHY Bilateral 09/07/2017   Procedure: Lower Extremity Angiography;  Surgeon: Waynetta Sandy, MD;  Location: [facility];  Service: Cardiovascular;  Laterality: Bilateral;  . PERIPHERAL VASCULAR ATHERECTOMY  02/03/2017   Procedure: Peripheral Vascular Atherectomy;  Surgeon: Waynetta Sandy, MD;  Location: [facility] [facility];  Service: Cardiovascular;;  . PERIPHERAL VASCULAR BALLOON ANGIOPLASTY  02/03/2017   Procedure: Peripheral Vascular Balloon Angioplasty;  Surgeon: Waynetta Sandy, MD;  Location: [facility];  Service: Cardiovascular;;  . PERIPHERAL VASCULAR INTERVENTION Left 09/07/2017   Procedure: PERIPHERAL VASCULAR INTERVENTION;  Surgeon: Waynetta Sandy, MD;  Location: [place];  Service: Cardiovascular;  Laterality: Left;  SFA/POPLITEAL  . TUBAL LIGATION     Social History   Occupational History  . Not on file  Tobacco Use  . Smoking status: Current Every Day Smoker    Packs/day: 0.25    Years: 28.00    Pack years: 7.00      Types: Cigarettes  . Smokeless tobacco: Never Used  Substance and Sexual Activity  . Alcohol use: No  . Drug use: No  . Sexual activity: Yes    Birth control/protection: None

## 2018-01-09 ENCOUNTER — Encounter (INDEPENDENT_AMBULATORY_CARE_PROVIDER_SITE_OTHER): Payer: Self-pay

## 2018-01-09 ENCOUNTER — Ambulatory Visit (INDEPENDENT_AMBULATORY_CARE_PROVIDER_SITE_OTHER): Payer: Medicaid Other | Admitting: Orthopedic Surgery

## 2018-01-10 ENCOUNTER — Encounter (INDEPENDENT_AMBULATORY_CARE_PROVIDER_SITE_OTHER): Payer: Self-pay

## 2018-01-10 ENCOUNTER — Ambulatory Visit (INDEPENDENT_AMBULATORY_CARE_PROVIDER_SITE_OTHER): Payer: Medicaid Other | Admitting: Orthopedic Surgery

## 2018-01-16 ENCOUNTER — Encounter (INDEPENDENT_AMBULATORY_CARE_PROVIDER_SITE_OTHER): Payer: Self-pay | Admitting: Orthopedic Surgery

## 2018-01-16 ENCOUNTER — Ambulatory Visit (INDEPENDENT_AMBULATORY_CARE_PROVIDER_SITE_OTHER): Payer: Medicaid Other | Admitting: Orthopedic Surgery

## 2018-01-16 ENCOUNTER — Ambulatory Visit (INDEPENDENT_AMBULATORY_CARE_PROVIDER_SITE_OTHER): Payer: Medicaid Other

## 2018-01-16 VITALS — Ht 69.0 in | Wt 150.0 lb

## 2018-01-16 DIAGNOSIS — L03116 Cellulitis of left lower limb: Secondary | ICD-10-CM

## 2018-01-16 DIAGNOSIS — M86272 Subacute osteomyelitis, left ankle and foot: Secondary | ICD-10-CM

## 2018-01-16 DIAGNOSIS — S98132A Complete traumatic amputation of one left lesser toe, initial encounter: Secondary | ICD-10-CM

## 2018-01-16 MED ORDER — OXYCODONE-ACETAMINOPHEN 10-325 MG PO TABS: 1.0000 | ORAL_TABLET | ORAL | 0 refills | Status: DC | PRN

## 2018-01-16 MED ORDER — NITROGLYCERIN 0.2 MG/HR TD PT24: 0.2000 mg | MEDICATED_PATCH | Freq: Every day | TRANSDERMAL | 12 refills | Status: DC

## 2018-01-16 MED ORDER — DOXYCYCLINE HYCLATE 100 MG PO CAPS: 100.0000 mg | ORAL_CAPSULE | Freq: Two times a day (BID) | ORAL | 0 refills | Status: DC

## 2018-01-16 MED ORDER — PENTOXIFYLLINE ER 400 MG PO TBCR: 400.0000 mg | EXTENDED_RELEASE_TABLET | Freq: Three times a day (TID) | ORAL | 1 refills | Status: DC

## 2018-01-16 NOTE — Progress Notes (Signed)
Office Visit Note   Patient: Kathy Phelps           Date of Birth: Jan 06, 1981           MRN: [id number] Visit Date: 01/16/2018              Requested by: No referring provider defined for this encounter. PCP: Patient, No Pcp Per  Chief Complaint  Patient presents with  . Left Foot - Routine Post Op    Left 5th ray amputation 12/21/17      HPI: Patient is a 37 year old woman who was seen in follow-up status post third fourth and fifth ray amputations of her left foot.  Patient is using Percocet for pain she states she feels like she has infection that 6 going up her leg.  Assessment & Plan: Visit Diagnoses:  1. Amputated toe of left foot ([place])     Plan: Patient was called in a new prescription for the doxycycline.  We will start her on Trental  and nitroglycerin to try to improve her microscopic circulation.  Continue with Dial soap cleansing continue nonweightbearing continue using the wheelchair.  Follow-Up Instructions: Return in about 1 week (around 01/23/2018).   Ortho Exam  Patient is alert, oriented, no adenopathy, well-dressed, normal affect, normal respiratory effort. Examination patient has a strong biphasic dorsalis pedis and posterior tibial pulse on the left.  She has excellent circulation down to the ankle and has good hair growth down to the ankle.  Her foot is warm she does have some ischemic black eschar over the lateral incision there is no a sending cellulitis there is no streaking.  There is no purulent drainage she has a small amount of clear serosanguineous drainage.  Imaging: No results found. No images are attached to the encounter.  Labs: Lab Results  Component Value Date   HGBA1C 11.8 (H) 02/02/2017   ESRSEDRATE 63 (H) 12/13/2017   ESRSEDRATE 72 (H) 09/02/2017   ESRSEDRATE 34 (H) 02/02/2017   CRP 3.5 (H) 09/02/2017   REPTSTATUS 09/07/2017 FINAL 09/02/2017   GRAMSTAIN  06/21/2013    FEW WBC PRESENT, PREDOMINANTLY PMN RARE SQUAMOUS EPITHELIAL  CELLS PRESENT FEW GRAM POSITIVE COCCI IN PAIRS IN CLUSTERS Performed at [facility]  09/02/2017    NO GROWTH 5 DAYS Performed at [facility], [place] [address], [place],  [address]    LABORGA ESCHERICHIA COLI (A) 05/23/2016    @LABSALLVALUES (HGBA1)@  Body mass index is 22.15 kg/m.  Orders:  Orders Placed This Encounter  Procedures  . XR Foot 2 Views Left   Meds ordered this encounter  Medications  . oxyCODONE-acetaminophen (PERCOCET) 10-325 MG tablet    Sig: Take 1 tablet by mouth every 4 (four) hours as needed for pain.    Dispense:  20 tablet    Refill:  0  . nitroGLYCERIN (NITRO-DUR) 0.2 mg/hr patch    Sig: Place 1 patch (0.2 mg total) onto the skin daily.    Dispense:  30 patch    Refill:  12  . pentoxifylline (TRENTAL) 400 MG CR tablet    Sig: Take 1 tablet (400 mg total) by mouth 3 (three) times daily with meals.    Dispense:  90 tablet    Refill:  1  . doxycycline (VIBRAMYCIN) 100 MG capsule    Sig: Take 1 capsule (100 mg total) by mouth 2 (two) times daily.    Dispense:  60 capsule    Refill:  0  Procedures: No procedures performed  Clinical Data: No additional findings.  ROS:  All other systems negative, except as noted in the HPI. Review of Systems  Objective: Vital Signs: Ht 5\' 9"  (1.753 m)   Wt 150 lb (68 kg)   BMI 22.15 kg/m   Specialty Comments:  No specialty comments available.  PMFS History: Patient Active Problem List   Diagnosis Date Noted  . Subacute osteomyelitis, left ankle and foot ([place])   . Surgical wound, non healing 09/02/2017  . Bipolar 1 disorder ([place]) 09/02/2017  . Nausea vomiting and diarrhea 08/11/2017  . Great toe pain, left 08/11/2017  . GERD (gastroesophageal reflux disease) 08/10/2017  . Anxiety 08/10/2017  . Edema   . Cellulitis   . AKI (acute kidney injury) ([place]) 02/04/2017  . Cellulitis of left foot 02/02/2017  . Type II diabetes mellitus with renal manifestations,  uncontrolled ([place]) 02/02/2017  . HLD (hyperlipidemia) 02/02/2017  . Tobacco abuse 02/02/2017  . Essential hypertension 02/02/2017  . Malnutrition of moderate degree 02/02/2017   Past Medical History:  Diagnosis Date  . Bipolar affective ([place])   . Complete miscarriage   . Depression   . Diabetes mellitus   . GERD (gastroesophageal reflux disease)   . Headache   . Hyperlipidemia   . Hypertension   . Osteomyelitis ([place])    left foot  . Pregnancy complication    HELP Syndrome  . Renal disorder   . Schizophrenia ([place])   . Stroke [facility])    " mild stroke"    Family History  Problem Relation Age of Onset  . Diabetes Mellitus II Mother   . Heart disease Mother   . Heart disease Father     Past Surgical History:  Procedure Laterality Date  . ABDOMINAL AORTOGRAM N/A 09/07/2017   Procedure: ABDOMINAL AORTOGRAM;  Surgeon: Waynetta Sandy, MD;  Location: [facility];  Service: Cardiovascular;  Laterality: N/A;  . ABDOMINAL AORTOGRAM W/LOWER EXTREMITY N/A 02/03/2017   Procedure: Abdominal Aortogram w/Lower Extremity;  Surgeon: Waynetta Sandy, MD;  Location: [facility];  Service: Cardiovascular;  Laterality: N/A;  . AMPUTATION Left 02/10/2017   Procedure: AMPUTATION TOES 3, 4 AND 5  LEFT FOOT;  Surgeon: Waynetta Sandy, MD;  Location: [facility];  Service: Vascular;  Laterality: Left;  . AMPUTATION Left 12/21/2017   Procedure: LEFT FOOT 5TH RAY AMPUTATION;  Surgeon: Newt Minion, MD;  Location: [facility];  Service: Orthopedics;  Laterality: Left;  . CESAREAN SECTION    . CHOLECYSTECTOMY    . LOWER EXTREMITY ANGIOGRAPHY Bilateral 09/07/2017   Procedure: Lower Extremity Angiography;  Surgeon: Waynetta Sandy, MD;  Location: [facility];  Service: Cardiovascular;  Laterality: Bilateral;  . PERIPHERAL VASCULAR ATHERECTOMY  02/03/2017   Procedure: Peripheral Vascular Atherectomy;  Surgeon: Waynetta Sandy, MD;  Location: [facility]  [facility];  Service: Cardiovascular;;  . PERIPHERAL VASCULAR BALLOON ANGIOPLASTY  02/03/2017   Procedure: Peripheral Vascular Balloon Angioplasty;  Surgeon: Waynetta Sandy, MD;  Location: [place] [facility];  Service: Cardiovascular;;  . PERIPHERAL VASCULAR INTERVENTION Left 09/07/2017   Procedure: PERIPHERAL VASCULAR INTERVENTION;  Surgeon: Waynetta Sandy, MD;  Location: [place] [facility];  Service: Cardiovascular;  Laterality: Left;  SFA/POPLITEAL  . TUBAL LIGATION     Social History   Occupational History  . Not on file  Tobacco Use  . Smoking status: Current Every Day Smoker    Packs/day: 0.25    Years: 28.00  Pack years: 7.00    Types: Cigarettes  . Smokeless tobacco: Never Used  Substance and Sexual Activity  . Alcohol use: No  . Drug use: No  . Sexual activity: Yes    Birth control/protection: None

## 2018-01-26 ENCOUNTER — Telehealth (INDEPENDENT_AMBULATORY_CARE_PROVIDER_SITE_OTHER): Payer: Self-pay | Admitting: *Deleted

## 2018-01-26 ENCOUNTER — Ambulatory Visit (INDEPENDENT_AMBULATORY_CARE_PROVIDER_SITE_OTHER): Payer: Medicaid Other | Admitting: Orthopedic Surgery

## 2018-01-26 ENCOUNTER — Encounter (INDEPENDENT_AMBULATORY_CARE_PROVIDER_SITE_OTHER): Payer: Self-pay | Admitting: Orthopedic Surgery

## 2018-01-26 VITALS — Ht 69.0 in | Wt 150.0 lb

## 2018-01-26 DIAGNOSIS — S98132A Complete traumatic amputation of one left lesser toe, initial encounter: Secondary | ICD-10-CM

## 2018-01-26 DIAGNOSIS — T8781 Dehiscence of amputation stump: Secondary | ICD-10-CM

## 2018-01-26 DIAGNOSIS — M86272 Subacute osteomyelitis, left ankle and foot: Secondary | ICD-10-CM

## 2018-01-26 MED ORDER — OXYCODONE-ACETAMINOPHEN 10-325 MG PO TABS: 1.0000 | ORAL_TABLET | ORAL | 0 refills | Status: DC | PRN

## 2018-01-26 NOTE — Telephone Encounter (Signed)
Called and lm on vm for pharm to advise ok to fill if the rx she is post surgical and if she wants to pay for the rx out of pocket then that it is ok to fill.

## 2018-01-26 NOTE — Progress Notes (Signed)
Office Visit Note   Patient: Kathy Phelps           Date of Birth: 25-Jul-1981           MRN: [id number] Visit Date: 01/26/2018              Requested by: No referring provider defined for this encounter. PCP: Patient, No Pcp Per  Chief Complaint  Patient presents with  . Right Foot - Routine Post Op    Right foot 5th ray amp 12/21/17      HPI: Patient is a 37 year old woman with peripheral vascular disease still smoking has been using a nitroglycerin patch as well as Trental and doxycycline.  Patient states she has an odor she is still smoking she is down to half a pack a day.  Patient complains of pain that extends up to her ankle.  Assessment & Plan: Visit Diagnoses:  1. Amputated toe of left foot ([place])   2. Subacute osteomyelitis, left ankle and foot ([place])   3. Dehiscence of amputation stump ([place])     Plan: Discussed with patient options to continue wound care continue foot salvage intervention.  Patient states that she is having too much pain to consider foot salvage intervention.  A refill prescription for Percocet was written we will plan for transtibial amputation next week.  Follow-Up Instructions: Return in about 2 weeks (around 02/09/2018).   Ortho Exam  Patient is alert, oriented, no adenopathy, well-dressed, normal affect, normal respiratory effort. Examination patient has gangrenous changes to the surgical incision for fifth ray amputation left foot.  There is no ascending cellulitis there is a small amount of drainage.  Patient has good hair growth down to her mid tibia.  No signs of infection the calf the calf is soft nontender to palpation.  Any palpation around the foot is painful.  Imaging: No results found. No images are attached to the encounter.  Labs: Lab Results  Component Value Date   HGBA1C 11.8 (H) 02/02/2017   ESRSEDRATE 63 (H) 12/13/2017   ESRSEDRATE 72 (H) 09/02/2017   ESRSEDRATE 34 (H) 02/02/2017   CRP 3.5 (H) 09/02/2017   REPTSTATUS  09/07/2017 FINAL 09/02/2017   GRAMSTAIN  06/21/2013    FEW WBC PRESENT, PREDOMINANTLY PMN RARE SQUAMOUS EPITHELIAL CELLS PRESENT FEW GRAM POSITIVE COCCI IN PAIRS IN CLUSTERS Performed at [facility]  09/02/2017    NO GROWTH 5 DAYS Performed at [facility], [place] [address], [place], [place] [address]    LABORGA ESCHERICHIA COLI (A) 05/23/2016    @LABSALLVALUES (HGBA1)@  Body mass index is 22.15 kg/m.  Orders:  No orders of the defined types were placed in this encounter.  No orders of the defined types were placed in this encounter.    Procedures: No procedures performed  Clinical Data: No additional findings.  ROS:  All other systems negative, except as noted in the HPI. Review of Systems  Objective: Vital Signs: Ht 5\' 9"  (1.753 m)   Wt 150 lb (68 kg)   BMI 22.15 kg/m   Specialty Comments:  No specialty comments available.  PMFS History: Patient Active Problem List   Diagnosis Date Noted  . Dehiscence of amputation stump ([place]) 01/26/2018  . Amputated toe of left foot ([place]) 01/26/2018  . Subacute osteomyelitis, left ankle and foot ([place])   . Surgical wound, non healing 09/02/2017  . Bipolar 1 disorder ([place]) 09/02/2017  . Nausea vomiting and diarrhea 08/11/2017  . Great toe pain,  left 08/11/2017  . GERD (gastroesophageal reflux disease) 08/10/2017  . Anxiety 08/10/2017  . Edema   . Cellulitis   . AKI (acute kidney injury) ([place]) 02/04/2017  . Cellulitis of left foot 02/02/2017  . Type II diabetes mellitus with renal manifestations, uncontrolled ([place]) 02/02/2017  . HLD (hyperlipidemia) 02/02/2017  . Tobacco abuse 02/02/2017  . Essential hypertension 02/02/2017  . Malnutrition of moderate degree 02/02/2017   Past Medical History:  Diagnosis Date  . Bipolar affective ([place])   . Complete miscarriage   . Depression   . Diabetes mellitus   . GERD (gastroesophageal reflux disease)   . Headache   . Hyperlipidemia   . Hypertension     . Osteomyelitis ([place])    left foot  . Pregnancy complication    HELP Syndrome  . Renal disorder   . Schizophrenia ([place])   . Stroke [facility])    " mild stroke"    Family History  Problem Relation Age of Onset  . Diabetes Mellitus II Mother   . Heart disease Mother   . Heart disease Father     Past Surgical History:  Procedure Laterality Date  . ABDOMINAL AORTOGRAM N/A 09/07/2017   Procedure: ABDOMINAL AORTOGRAM;  Surgeon: Waynetta Sandy, MD;  Location: [place] [facility];  Service: Cardiovascular;  Laterality: N/A;  . ABDOMINAL AORTOGRAM W/LOWER EXTREMITY N/A 02/03/2017   Procedure: Abdominal Aortogram w/Lower Extremity;  Surgeon: Waynetta Sandy, MD;  Location: [place] [facility];  Service: Cardiovascular;  Laterality: N/A;  . AMPUTATION Left 02/10/2017   Procedure: AMPUTATION TOES 3, 4 AND 5  LEFT FOOT;  Surgeon: Waynetta Sandy, MD;  Location: [facility];  Service: Vascular;  Laterality: Left;  . AMPUTATION Left 12/21/2017   Procedure: LEFT FOOT 5TH RAY AMPUTATION;  Surgeon: Newt Minion, MD;  Location: [facility];  Service: Orthopedics;  Laterality: Left;  . CESAREAN SECTION    . CHOLECYSTECTOMY    . LOWER EXTREMITY ANGIOGRAPHY Bilateral 09/07/2017   Procedure: Lower Extremity Angiography;  Surgeon: Waynetta Sandy, MD;  Location: [place] [facility];  Service: Cardiovascular;  Laterality: Bilateral;  . PERIPHERAL VASCULAR ATHERECTOMY  02/03/2017   Procedure: Peripheral Vascular Atherectomy;  Surgeon: Waynetta Sandy, MD;  Location: [place] [facility];  Service: Cardiovascular;;  . PERIPHERAL VASCULAR BALLOON ANGIOPLASTY  02/03/2017   Procedure: Peripheral Vascular Balloon Angioplasty;  Surgeon: Waynetta Sandy, MD;  Location: [place] [facility];  Service: Cardiovascular;;  . PERIPHERAL VASCULAR INTERVENTION Left 09/07/2017   Procedure: PERIPHERAL VASCULAR INTERVENTION;  Surgeon: Waynetta Sandy, MD;  Location: [place] [facility];   Service: Cardiovascular;  Laterality: Left;  SFA/POPLITEAL  . TUBAL LIGATION     Social History   Occupational History  . Not on file  Tobacco Use  . Smoking status: Current Every Day Smoker    Packs/day: 0.25    Years: 28.00    Pack years: 7.00    Types: Cigarettes  . Smokeless tobacco: Never Used  Substance and Sexual Activity  . Alcohol use: No  . Drug use: No  . Sexual activity: Yes    Birth control/protection: None

## 2018-01-26 NOTE — Telephone Encounter (Signed)
Kathy Phelps from [facility] called stating that Sharol Given wrote a prescription for pain medication and wanted to let him know that Kathy Phelps is locked into another provider for pain meds and wants to make sure he is aware. Kathy Phelps has Medicaid and if Dr. Sharol Given is ok with filling the prescription then Kathy Phelps would have to pay out of pocket because Medicaid will not cover it. Please advise.   CB# [phone]

## 2018-01-30 ENCOUNTER — Other Ambulatory Visit: Payer: Self-pay

## 2018-01-30 ENCOUNTER — Other Ambulatory Visit (INDEPENDENT_AMBULATORY_CARE_PROVIDER_SITE_OTHER): Payer: Self-pay | Admitting: Orthopedic Surgery

## 2018-01-30 ENCOUNTER — Encounter (HOSPITAL_COMMUNITY): Payer: Self-pay | Admitting: *Deleted

## 2018-01-30 DIAGNOSIS — I96 Gangrene, not elsewhere classified: Secondary | ICD-10-CM

## 2018-01-30 NOTE — Progress Notes (Addendum)
Ms Kathy Phelps denies chest pain or shortness of breath at rest.  Ms Kathy Phelps has type II diabetes.  Patient reports that she does not have a PCP , she states she was seen by A. Carter at [facility] 3 months ago and given enough medication for a year. Patient reports that CBG is never under 300 and that she checks it 3 times a day.  I instructed patient to take 1/2 dose of Lantus, 15 units  Tuesday at bedtime and 10 units of Lantus  Wednesday, morning.  I instructed patient that if CBG greater than 220 to take  1/2  Dose sliding scale insulin and  Call pre- op desk and ask for further instructions.  I attempted to give patient instructions of what to do if CBG < 70, she said " it never is and would not let me continue."  I requested records from [facility] care and ask anesthesia PA/NP-C to review chart.  Patient takes Pepcid, Reglan and Pantoprazole, she states that she was seeing a GI specialist, and "they wanted to do a light test, but I had to have my leg surgery first."

## 2018-01-30 NOTE — Progress Notes (Signed)
Anesthesia Chart Review:  Pt is a same day work up.    Case:  [id number] Date/Time:  02/01/18 1201   Procedure:  LEFT BELOW KNEE AMPUTATION (Left )   Anesthesia type:  General   Pre-op diagnosis:  Gangrene Left Foot   Location:  [facility] OR ROOM 06 / [place] OR   Surgeon:  Newt Minion, MD      DISCUSSION:  Pt with uncontrolled DM; per pt report, blood glucose is never below 300. Current smoker.    PROVIDERS: Patient, No Pcp Per. Pt reports she received a year's supply of diabetes meds from her mental health provider 3 months ago.    LABS: Labs will be obtained day of surgery.   (all labs ordered are listed, but only abnormal results are displayed)    IMAGES:  CXR 02/21/17: No active cardiopulmonary disease.   EKG 08/10/17: Sinus tachycardia (98 bpm). Ventricular bigeminy. Low voltage, extremity and precordial leads. Baseline wander in lead(s) III aVF   CV:  Echo 02/04/17:  - Left ventricle: The cavity size was normal. Wall thickness was normal. Systolic function was normal. The estimated ejection fraction was in the range of 50% to 55%. Wall motion was normal; there were no regional wall motion abnormalities. Features are consistent with a pseudonormal left ventricular filling pattern, with concomitant abnormal relaxation and increased filling pressure (grade 2 diastolic dysfunction).   Past Medical History:  Diagnosis Date  . Anxiety   . Asthma   . Bipolar affective ([place])   . Complete miscarriage   . Constipation   . Depression   . Diabetes mellitus    Type II  . GERD (gastroesophageal reflux disease)   . Headache    "migraraines"  . Hyperlipidemia   . Hypertension   . Osteomyelitis ([place])    left foot  . Pregnancy complication    HELP Syndrome  . Renal disorder   . Schizophrenia ([place])   . Stroke [facility])    " mild stroke", memory loss- approx 2017    Past Surgical History:  Procedure Laterality Date  . ABDOMINAL AORTOGRAM N/A 09/07/2017   Procedure: ABDOMINAL  AORTOGRAM;  Surgeon: Waynetta Sandy, MD;  Location: [place] CV LAB;  Service: Cardiovascular;  Laterality: N/A;  . ABDOMINAL AORTOGRAM W/LOWER EXTREMITY N/A 02/03/2017   Procedure: Abdominal Aortogram w/Lower Extremity;  Surgeon: Waynetta Sandy, MD;  Location: [place] CV LAB;  Service: Cardiovascular;  Laterality: N/A;  . AMPUTATION Left 02/10/2017   Procedure: AMPUTATION TOES 3, 4 AND 5  LEFT FOOT;  Surgeon: Waynetta Sandy, MD;  Location: [place];  Service: Vascular;  Laterality: Left;  . AMPUTATION Left 12/21/2017   Procedure: LEFT FOOT 5TH RAY AMPUTATION;  Surgeon: Newt Minion, MD;  Location: [place];  Service: Orthopedics;  Laterality: Left;  . CESAREAN SECTION    . CHOLECYSTECTOMY    . LOWER EXTREMITY ANGIOGRAPHY Bilateral 09/07/2017   Procedure: Lower Extremity Angiography;  Surgeon: Waynetta Sandy, MD;  Location: [place] CV LAB;  Service: Cardiovascular;  Laterality: Bilateral;  . PERIPHERAL VASCULAR ATHERECTOMY  02/03/2017   Procedure: Peripheral Vascular Atherectomy;  Surgeon: Waynetta Sandy, MD;  Location: [place] CV LAB;  Service: Cardiovascular;;  . PERIPHERAL VASCULAR BALLOON ANGIOPLASTY  02/03/2017   Procedure: Peripheral Vascular Balloon Angioplasty;  Surgeon: Waynetta Sandy, MD;  Location: [place] CV LAB;  Service: Cardiovascular;;  . PERIPHERAL VASCULAR INTERVENTION Left 09/07/2017   Procedure: PERIPHERAL VASCULAR INTERVENTION;  Surgeon: Waynetta Sandy, MD;  Location: [place] CV LAB;  Service: Cardiovascular;  Laterality: Left;  SFA/POPLITEAL  . TUBAL LIGATION      MEDICATIONS: No current facility-administered medications for this encounter.    Marland Kitchen ADVAIR DISKUS 100-50 MCG/DOSE AEPB  . alprazolam (XANAX) 2 MG tablet  . clopidogrel (PLAVIX) 75 MG tablet  . doxycycline (VIBRAMYCIN) 100 MG capsule  . famotidine (PEPCID) 20 MG tablet  . gabapentin (NEURONTIN) 800 MG tablet  . insulin  glargine (LANTUS) 100 UNIT/ML injection  . LATUDA 120 MG TABS  . metFORMIN (GLUCOPHAGE) 1000 MG tablet  . metoCLOPramide (REGLAN) 5 MG tablet  . metoprolol tartrate (LOPRESSOR) 25 MG tablet  . mirtazapine (REMERON) 15 MG tablet  . nitroGLYCERIN (NITRO-DUR) 0.2 mg/hr patch  . NOVOLOG 100 UNIT/ML injection  . oxcarbazepine (TRILEPTAL) 600 MG tablet  . oxyCODONE-acetaminophen (PERCOCET) 10-325 MG tablet  . pantoprazole (PROTONIX) 40 MG tablet  . pentoxifylline (TRENTAL) 400 MG CR tablet  . PROAIR HFA 108 (90 BASE) MCG/ACT inhaler  . simvastatin (ZOCOR) 40 MG tablet  . tamsulosin (FLOMAX) 0.4 MG CAPS capsule  . zolpidem (AMBIEN) 10 MG tablet  . Insulin Syringe-Needle U-100 (INSULIN SYRINGE .5CC/31GX5/16") 31G X 5/16" 0.5 ML MISC  . nicotine (NICODERM CQ - DOSED IN MG/24 HOURS) 21 mg/24hr patch    If labs acceptable day of surgery, I anticipate pt can proceed with surgery as scheduled.  Willeen Cass, FNP-BC [facility] Short Stay Surgical Center/Anesthesiology Phone: [phone] 01/30/2018 3:51 PM

## 2018-02-01 ENCOUNTER — Encounter (HOSPITAL_COMMUNITY)
Admission: RE | Disposition: A | Discharge status: Home Health | Payer: Self-pay | Source: Ambulatory Visit | Attending: Orthopedic Surgery

## 2018-02-01 ENCOUNTER — Other Ambulatory Visit: Payer: Self-pay

## 2018-02-01 ENCOUNTER — Encounter (HOSPITAL_COMMUNITY): Payer: Self-pay | Admitting: *Deleted

## 2018-02-01 ENCOUNTER — Inpatient Hospital Stay (HOSPITAL_COMMUNITY)
Admission: RE | Admit: 2018-02-01 | Discharge: 2018-02-06 | DRG: 475 | Disposition: A | Discharge status: Home Health | Payer: Medicaid Other | Source: Ambulatory Visit | Attending: Orthopedic Surgery | Admitting: Orthopedic Surgery

## 2018-02-01 ENCOUNTER — Inpatient Hospital Stay (HOSPITAL_COMMUNITY): Payer: Medicaid Other | Admitting: Emergency Medicine

## 2018-02-01 DIAGNOSIS — K219 Gastro-esophageal reflux disease without esophagitis: Secondary | ICD-10-CM | POA: Diagnosis present

## 2018-02-01 DIAGNOSIS — T8781 Dehiscence of amputation stump: Principal | ICD-10-CM | POA: Diagnosis present

## 2018-02-01 DIAGNOSIS — Z89512 Acquired absence of left leg below knee: Secondary | ICD-10-CM

## 2018-02-01 DIAGNOSIS — Z79899 Other long term (current) drug therapy: Secondary | ICD-10-CM | POA: Diagnosis not present

## 2018-02-01 DIAGNOSIS — E785 Hyperlipidemia, unspecified: Secondary | ICD-10-CM | POA: Diagnosis present

## 2018-02-01 DIAGNOSIS — E1152 Type 2 diabetes mellitus with diabetic peripheral angiopathy with gangrene: Secondary | ICD-10-CM | POA: Diagnosis present

## 2018-02-01 DIAGNOSIS — Z888 Allergy status to other drugs, medicaments and biological substances status: Secondary | ICD-10-CM | POA: Diagnosis not present

## 2018-02-01 DIAGNOSIS — Z885 Allergy status to narcotic agent status: Secondary | ICD-10-CM

## 2018-02-01 DIAGNOSIS — Z89422 Acquired absence of other left toe(s): Secondary | ICD-10-CM

## 2018-02-01 DIAGNOSIS — Y835 Amputation of limb(s) as the cause of abnormal reaction of the patient, or of later complication, without mention of misadventure at the time of the procedure: Secondary | ICD-10-CM | POA: Diagnosis present

## 2018-02-01 DIAGNOSIS — I10 Essential (primary) hypertension: Secondary | ICD-10-CM | POA: Diagnosis present

## 2018-02-01 DIAGNOSIS — I96 Gangrene, not elsewhere classified: Secondary | ICD-10-CM

## 2018-02-01 DIAGNOSIS — M86272 Subacute osteomyelitis, left ankle and foot: Secondary | ICD-10-CM | POA: Diagnosis present

## 2018-02-01 DIAGNOSIS — Z833 Family history of diabetes mellitus: Secondary | ICD-10-CM

## 2018-02-01 DIAGNOSIS — F17210 Nicotine dependence, cigarettes, uncomplicated: Secondary | ICD-10-CM | POA: Diagnosis present

## 2018-02-01 DIAGNOSIS — F419 Anxiety disorder, unspecified: Secondary | ICD-10-CM | POA: Diagnosis present

## 2018-02-01 DIAGNOSIS — J45909 Unspecified asthma, uncomplicated: Secondary | ICD-10-CM | POA: Diagnosis present

## 2018-02-01 DIAGNOSIS — Z7902 Long term (current) use of antithrombotics/antiplatelets: Secondary | ICD-10-CM | POA: Diagnosis not present

## 2018-02-01 DIAGNOSIS — R4781 Slurred speech: Secondary | ICD-10-CM | POA: Diagnosis not present

## 2018-02-01 DIAGNOSIS — R51 Headache: Secondary | ICD-10-CM | POA: Diagnosis present

## 2018-02-01 DIAGNOSIS — F319 Bipolar disorder, unspecified: Secondary | ICD-10-CM | POA: Diagnosis present

## 2018-02-01 DIAGNOSIS — E1165 Type 2 diabetes mellitus with hyperglycemia: Secondary | ICD-10-CM | POA: Diagnosis present

## 2018-02-01 DIAGNOSIS — F209 Schizophrenia, unspecified: Secondary | ICD-10-CM | POA: Diagnosis present

## 2018-02-01 DIAGNOSIS — Z8673 Personal history of transient ischemic attack (TIA), and cerebral infarction without residual deficits: Secondary | ICD-10-CM

## 2018-02-01 DIAGNOSIS — Z794 Long term (current) use of insulin: Secondary | ICD-10-CM | POA: Diagnosis not present

## 2018-02-01 HISTORY — DX: Anxiety disorder, unspecified: F41.9

## 2018-02-01 HISTORY — PX: AMPUTATION: SHX166

## 2018-02-01 HISTORY — DX: Constipation, unspecified: K59.00

## 2018-02-01 HISTORY — DX: Unspecified asthma, uncomplicated: J45.909

## 2018-02-01 LAB — BASIC METABOLIC PANEL
ANION GAP: 8 (ref 5–15)
BUN: 16 mg/dL (ref 6–20)
CALCIUM: 8.5 mg/dL — AB (ref 8.9–10.3)
CO2: 22 mmol/L (ref 22–32)
Chloride: 106 mmol/L (ref 101–111)
Creatinine, Ser: 1.53 mg/dL — ABNORMAL HIGH (ref 0.44–1.00)
GFR, EST AFRICAN AMERICAN: 49 mL/min — AB (ref >60)
GFR, EST NON AFRICAN AMERICAN: 42 mL/min — AB (ref >60)
Glucose, Bld: 191 mg/dL — ABNORMAL HIGH (ref 65–99)
POTASSIUM: 4.3 mmol/L (ref 3.5–5.1)
SODIUM: 136 mmol/L (ref 135–145)

## 2018-02-01 LAB — GLUCOSE, CAPILLARY
GLUCOSE-CAPILLARY: 153 mg/dL — AB (ref 65–99)
GLUCOSE-CAPILLARY: 133 mg/dL — AB (ref 65–99)
Glucose-Capillary: 169 mg/dL — ABNORMAL HIGH (ref 65–99)

## 2018-02-01 LAB — CBC
HCT: 32.9 % — ABNORMAL LOW (ref 36.0–46.0)
HEMOGLOBIN: 11 g/dL — AB (ref 12.0–15.0)
MCH: 29.4 pg (ref 26.0–34.0)
MCHC: 33.4 g/dL (ref 30.0–36.0)
MCV: 88 fL (ref 78.0–100.0)
Platelets: 240 10*3/uL (ref 150–400)
RBC: 3.74 MIL/uL — AB (ref 3.87–5.11)
RDW: 13 % (ref 11.5–15.5)
WBC: 10.9 10*3/uL — AB (ref 4.0–10.5)

## 2018-02-01 LAB — POCT PREGNANCY, URINE: PREG TEST UR: NEGATIVE

## 2018-02-01 LAB — HEMOGLOBIN A1C
HEMOGLOBIN A1C: 9.1 % — AB (ref 4.8–5.6)
MEAN PLASMA GLUCOSE: 214.47 mg/dL

## 2018-02-01 SURGERY — AMPUTATION BELOW KNEE
Anesthesia: General | Laterality: Left

## 2018-02-01 MED ORDER — OXYCODONE HCL 5 MG/5ML PO SOLN: 5.0000 mg | Freq: Once | ORAL | Status: AC | PRN

## 2018-02-01 MED ORDER — GABAPENTIN 400 MG PO CAPS
800.0000 mg | ORAL_CAPSULE | Freq: Two times a day (BID) | ORAL | Status: DC
  Administered 2018-02-01 – 2018-02-05 (×8): 800 mg via ORAL
  Filled 2018-02-01 (×8): qty 2

## 2018-02-01 MED ORDER — ONDANSETRON HCL 4 MG/2ML IJ SOLN
INTRAMUSCULAR | Status: DC | PRN
  Administered 2018-02-01: 4 mg via INTRAVENOUS

## 2018-02-01 MED ORDER — SUGAMMADEX SODIUM 500 MG/5ML IV SOLN
INTRAVENOUS | Status: AC
  Filled 2018-02-01: qty 5

## 2018-02-01 MED ORDER — MAGNESIUM CITRATE PO SOLN
1.0000 | Freq: Once | ORAL | Status: DC | PRN
  Filled 2018-02-01: qty 296

## 2018-02-01 MED ORDER — DEXMEDETOMIDINE HCL 200 MCG/2ML IV SOLN
INTRAVENOUS | Status: DC | PRN
  Administered 2018-02-01: 20 ug via INTRAVENOUS

## 2018-02-01 MED ORDER — METHOCARBAMOL 500 MG PO TABS
500.0000 mg | ORAL_TABLET | Freq: Four times a day (QID) | ORAL | Status: DC | PRN
  Administered 2018-02-01 – 2018-02-05 (×6): 500 mg via ORAL
  Filled 2018-02-01 (×7): qty 1

## 2018-02-01 MED ORDER — SIMVASTATIN 40 MG PO TABS
40.0000 mg | ORAL_TABLET | Freq: Every evening | ORAL | Status: DC
  Administered 2018-02-01 – 2018-02-05 (×5): 40 mg via ORAL
  Filled 2018-02-01 (×5): qty 1

## 2018-02-01 MED ORDER — OXYCODONE HCL 5 MG PO TABS
10.0000 mg | ORAL_TABLET | ORAL | Status: DC | PRN
  Administered 2018-02-01 – 2018-02-05 (×10): 15 mg via ORAL
  Filled 2018-02-01 (×10): qty 3

## 2018-02-01 MED ORDER — LIDOCAINE HCL (CARDIAC) PF 100 MG/5ML IV SOSY
PREFILLED_SYRINGE | INTRAVENOUS | Status: DC | PRN
  Administered 2018-02-01: 80 mg via INTRAVENOUS

## 2018-02-01 MED ORDER — CEFAZOLIN SODIUM-DEXTROSE 2-4 GM/100ML-% IV SOLN
INTRAVENOUS | Status: AC
  Filled 2018-02-01: qty 100

## 2018-02-01 MED ORDER — ACETAMINOPHEN 160 MG/5ML PO SOLN: 325.0000 mg | ORAL | Status: DC | PRN

## 2018-02-01 MED ORDER — FENTANYL CITRATE (PF) 100 MCG/2ML IJ SOLN
50.0000 ug | Freq: Once | INTRAMUSCULAR | Status: AC
  Administered 2018-02-01: 50 ug via INTRAVENOUS

## 2018-02-01 MED ORDER — METOCLOPRAMIDE HCL 5 MG PO TABS
5.0000 mg | ORAL_TABLET | Freq: Three times a day (TID) | ORAL | Status: DC
  Administered 2018-02-01 – 2018-02-06 (×12): 5 mg via ORAL
  Filled 2018-02-01 (×13): qty 1

## 2018-02-01 MED ORDER — OXYCODONE HCL 5 MG PO TABS
5.0000 mg | ORAL_TABLET | ORAL | Status: DC | PRN
  Administered 2018-02-04: 10 mg via ORAL
  Filled 2018-02-01: qty 2

## 2018-02-01 MED ORDER — ONDANSETRON HCL 4 MG PO TABS
4.0000 mg | ORAL_TABLET | Freq: Four times a day (QID) | ORAL | Status: DC | PRN
  Administered 2018-02-05: 4 mg via ORAL
  Filled 2018-02-01: qty 1

## 2018-02-01 MED ORDER — ONDANSETRON HCL 4 MG/2ML IJ SOLN
4.0000 mg | Freq: Four times a day (QID) | INTRAMUSCULAR | Status: DC | PRN
Start: 2018-02-01 — End: 2018-02-06
  Administered 2018-02-04: 4 mg via INTRAVENOUS
  Filled 2018-02-01 (×2): qty 2

## 2018-02-01 MED ORDER — METOCLOPRAMIDE HCL 5 MG/ML IJ SOLN: 5.0000 mg | Freq: Three times a day (TID) | INTRAMUSCULAR | Status: DC | PRN

## 2018-02-01 MED ORDER — INSULIN GLARGINE 100 UNIT/ML ~~LOC~~ SOLN
30.0000 [IU] | Freq: Every day | SUBCUTANEOUS | Status: DC
  Filled 2018-02-01 (×5): qty 0.3

## 2018-02-01 MED ORDER — LURASIDONE HCL 40 MG PO TABS
120.0000 mg | ORAL_TABLET | Freq: Every day | ORAL | Status: DC
  Administered 2018-02-02 – 2018-02-05 (×3): 120 mg via ORAL
  Filled 2018-02-01 (×5): qty 3

## 2018-02-01 MED ORDER — ACETAMINOPHEN 325 MG PO TABS
325.0000 mg | ORAL_TABLET | Freq: Four times a day (QID) | ORAL | Status: DC | PRN
  Administered 2018-02-04 – 2018-02-05 (×2): 650 mg via ORAL
  Filled 2018-02-01 (×3): qty 2

## 2018-02-01 MED ORDER — METOCLOPRAMIDE HCL 5 MG PO TABS: 5.0000 mg | ORAL_TABLET | Freq: Three times a day (TID) | ORAL | Status: DC | PRN

## 2018-02-01 MED ORDER — MIDAZOLAM HCL 5 MG/5ML IJ SOLN
INTRAMUSCULAR | Status: DC | PRN
  Administered 2018-02-01: 2 mg via INTRAVENOUS

## 2018-02-01 MED ORDER — PROPOFOL 10 MG/ML IV BOLUS
INTRAVENOUS | Status: AC
  Filled 2018-02-01: qty 20

## 2018-02-01 MED ORDER — SUCCINYLCHOLINE CHLORIDE 20 MG/ML IJ SOLN
INTRAMUSCULAR | Status: DC | PRN
  Administered 2018-02-01: 160 mg via INTRAVENOUS

## 2018-02-01 MED ORDER — FENTANYL CITRATE (PF) 100 MCG/2ML IJ SOLN
INTRAMUSCULAR | Status: AC
  Filled 2018-02-01: qty 2

## 2018-02-01 MED ORDER — OXYCODONE HCL 5 MG PO TABS
ORAL_TABLET | ORAL | Status: AC
  Filled 2018-02-01: qty 1

## 2018-02-01 MED ORDER — LACTATED RINGERS IV SOLN
INTRAVENOUS | Status: DC
  Administered 2018-02-01: 12:00:00 via INTRAVENOUS

## 2018-02-01 MED ORDER — ALBUTEROL SULFATE (2.5 MG/3ML) 0.083% IN NEBU: 3.0000 mL | INHALATION_SOLUTION | Freq: Four times a day (QID) | RESPIRATORY_TRACT | Status: DC | PRN

## 2018-02-01 MED ORDER — METOPROLOL TARTRATE 12.5 MG HALF TABLET
12.5000 mg | ORAL_TABLET | Freq: Once | ORAL | Status: AC
  Administered 2018-02-01: 12.5 mg via ORAL

## 2018-02-01 MED ORDER — INSULIN ASPART 100 UNIT/ML ~~LOC~~ SOLN
0.0000 [IU] | Freq: Three times a day (TID) | SUBCUTANEOUS | Status: DC
  Administered 2018-02-05 (×2): 3 [IU] via SUBCUTANEOUS

## 2018-02-01 MED ORDER — OXYCODONE HCL 5 MG PO TABS
5.0000 mg | ORAL_TABLET | Freq: Once | ORAL | Status: AC | PRN
  Administered 2018-02-01: 5 mg via ORAL

## 2018-02-01 MED ORDER — CLOPIDOGREL BISULFATE 75 MG PO TABS
75.0000 mg | ORAL_TABLET | Freq: Every day | ORAL | Status: DC
  Administered 2018-02-02 – 2018-02-06 (×5): 75 mg via ORAL
  Filled 2018-02-01 (×5): qty 1

## 2018-02-01 MED ORDER — PANTOPRAZOLE SODIUM 40 MG PO TBEC
40.0000 mg | DELAYED_RELEASE_TABLET | Freq: Every day | ORAL | Status: DC
  Administered 2018-02-01 – 2018-02-06 (×6): 40 mg via ORAL
  Filled 2018-02-01 (×6): qty 1

## 2018-02-01 MED ORDER — MEPERIDINE HCL 50 MG/ML IJ SOLN: 6.2500 mg | INTRAMUSCULAR | Status: DC | PRN

## 2018-02-01 MED ORDER — ZOLPIDEM TARTRATE 5 MG PO TABS: 5.0000 mg | ORAL_TABLET | Freq: Every evening | ORAL | Status: DC | PRN

## 2018-02-01 MED ORDER — HYDROMORPHONE HCL 2 MG/ML IJ SOLN
0.5000 mg | INTRAMUSCULAR | Status: DC | PRN
  Administered 2018-02-01 – 2018-02-04 (×8): 1 mg via INTRAVENOUS
  Filled 2018-02-01 (×8): qty 1

## 2018-02-01 MED ORDER — TAMSULOSIN HCL 0.4 MG PO CAPS
0.4000 mg | ORAL_CAPSULE | Freq: Every day | ORAL | Status: DC
  Administered 2018-02-01 – 2018-02-05 (×5): 0.4 mg via ORAL
  Filled 2018-02-01 (×6): qty 1

## 2018-02-01 MED ORDER — ALPRAZOLAM 0.5 MG PO TABS
2.0000 mg | ORAL_TABLET | Freq: Four times a day (QID) | ORAL | Status: DC | PRN
  Administered 2018-02-01 – 2018-02-05 (×4): 2 mg via ORAL
  Filled 2018-02-01 (×4): qty 4

## 2018-02-01 MED ORDER — DEXTROSE 5 % IV SOLN
500.0000 mg | Freq: Four times a day (QID) | INTRAVENOUS | Status: DC | PRN
  Filled 2018-02-01: qty 5

## 2018-02-01 MED ORDER — BISACODYL 10 MG RE SUPP: 10.0000 mg | Freq: Every day | RECTAL | Status: DC | PRN

## 2018-02-01 MED ORDER — OXCARBAZEPINE 300 MG PO TABS
600.0000 mg | ORAL_TABLET | Freq: Two times a day (BID) | ORAL | Status: DC
  Administered 2018-02-01 – 2018-02-05 (×8): 600 mg via ORAL
  Filled 2018-02-01 (×10): qty 2

## 2018-02-01 MED ORDER — SODIUM CHLORIDE 0.9 % IV SOLN
INTRAVENOUS | Status: DC
  Administered 2018-02-01: 19:00:00 via INTRAVENOUS

## 2018-02-01 MED ORDER — FENTANYL CITRATE (PF) 250 MCG/5ML IJ SOLN
INTRAMUSCULAR | Status: AC
  Filled 2018-02-01: qty 5

## 2018-02-01 MED ORDER — POLYETHYLENE GLYCOL 3350 17 G PO PACK: 17.0000 g | PACK | Freq: Every day | ORAL | Status: DC | PRN

## 2018-02-01 MED ORDER — FENTANYL CITRATE (PF) 100 MCG/2ML IJ SOLN
INTRAMUSCULAR | Status: DC | PRN
  Administered 2018-02-01: 100 ug via INTRAVENOUS
  Administered 2018-02-01: 50 ug via INTRAVENOUS
  Administered 2018-02-01: 100 ug via INTRAVENOUS

## 2018-02-01 MED ORDER — CHLORHEXIDINE GLUCONATE 4 % EX LIQD: 60.0000 mL | Freq: Once | CUTANEOUS | Status: DC

## 2018-02-01 MED ORDER — ONDANSETRON HCL 4 MG/2ML IJ SOLN: 4.0000 mg | Freq: Once | INTRAMUSCULAR | Status: DC | PRN

## 2018-02-01 MED ORDER — INSULIN ASPART 100 UNIT/ML ~~LOC~~ SOLN
4.0000 [IU] | Freq: Three times a day (TID) | SUBCUTANEOUS | Status: DC
  Administered 2018-02-05 (×2): 4 [IU] via SUBCUTANEOUS

## 2018-02-01 MED ORDER — FAMOTIDINE 20 MG PO TABS
20.0000 mg | ORAL_TABLET | Freq: Two times a day (BID) | ORAL | Status: DC
  Administered 2018-02-01 – 2018-02-06 (×9): 20 mg via ORAL
  Filled 2018-02-01 (×10): qty 1

## 2018-02-01 MED ORDER — MIRTAZAPINE 15 MG PO TABS
15.0000 mg | ORAL_TABLET | Freq: Every day | ORAL | Status: DC
  Administered 2018-02-01 – 2018-02-05 (×5): 15 mg via ORAL
  Filled 2018-02-01 (×5): qty 1

## 2018-02-01 MED ORDER — METOPROLOL TARTRATE 12.5 MG HALF TABLET
12.5000 mg | ORAL_TABLET | Freq: Two times a day (BID) | ORAL | Status: DC
  Administered 2018-02-01 – 2018-02-06 (×7): 12.5 mg via ORAL
  Filled 2018-02-01 (×9): qty 1

## 2018-02-01 MED ORDER — MIDAZOLAM HCL 2 MG/2ML IJ SOLN
INTRAMUSCULAR | Status: AC
  Filled 2018-02-01: qty 2

## 2018-02-01 MED ORDER — CEFAZOLIN SODIUM-DEXTROSE 1-4 GM/50ML-% IV SOLN
1.0000 g | Freq: Four times a day (QID) | INTRAVENOUS | Status: AC
  Administered 2018-02-02 (×3): 1 g via INTRAVENOUS
  Filled 2018-02-01 (×3): qty 50

## 2018-02-01 MED ORDER — FENTANYL CITRATE (PF) 100 MCG/2ML IJ SOLN
25.0000 ug | INTRAMUSCULAR | Status: DC | PRN
  Administered 2018-02-01 (×3): 50 ug via INTRAVENOUS

## 2018-02-01 MED ORDER — CEFAZOLIN SODIUM-DEXTROSE 2-4 GM/100ML-% IV SOLN
2.0000 g | INTRAVENOUS | Status: AC
  Administered 2018-02-01: 2 g via INTRAVENOUS

## 2018-02-01 MED ORDER — PROPOFOL 10 MG/ML IV BOLUS
INTRAVENOUS | Status: DC | PRN
  Administered 2018-02-01: 150 mg via INTRAVENOUS

## 2018-02-01 MED ORDER — MOMETASONE FURO-FORMOTEROL FUM 100-5 MCG/ACT IN AERO
2.0000 | INHALATION_SPRAY | Freq: Two times a day (BID) | RESPIRATORY_TRACT | Status: DC
  Administered 2018-02-01 – 2018-02-05 (×9): 2 via RESPIRATORY_TRACT
  Filled 2018-02-01: qty 8.8

## 2018-02-01 MED ORDER — METOPROLOL TARTRATE 12.5 MG HALF TABLET
ORAL_TABLET | ORAL | Status: AC
  Filled 2018-02-01: qty 1

## 2018-02-01 MED ORDER — PENTOXIFYLLINE ER 400 MG PO TBCR
400.0000 mg | EXTENDED_RELEASE_TABLET | Freq: Three times a day (TID) | ORAL | Status: DC
  Administered 2018-02-02 – 2018-02-05 (×10): 400 mg via ORAL
  Filled 2018-02-01 (×16): qty 1

## 2018-02-01 MED ORDER — ACETAMINOPHEN 325 MG PO TABS: 325.0000 mg | ORAL_TABLET | ORAL | Status: DC | PRN

## 2018-02-01 MED ORDER — DOCUSATE SODIUM 100 MG PO CAPS
100.0000 mg | ORAL_CAPSULE | Freq: Two times a day (BID) | ORAL | Status: DC
  Administered 2018-02-02 – 2018-02-05 (×6): 100 mg via ORAL
  Filled 2018-02-01 (×9): qty 1

## 2018-02-01 MED ORDER — METFORMIN HCL 500 MG PO TABS
1000.0000 mg | ORAL_TABLET | Freq: Two times a day (BID) | ORAL | Status: DC
  Administered 2018-02-01 – 2018-02-05 (×9): 1000 mg via ORAL
  Filled 2018-02-01 (×11): qty 2

## 2018-02-01 SURGICAL SUPPLY — 30 items
BLADE SAW RECIP 87.9 MT (BLADE) ×3 IMPLANT
BLADE SURG 21 STRL SS (BLADE) ×3 IMPLANT
BNDG COHESIVE 6X5 TAN STRL LF (GAUZE/BANDAGES/DRESSINGS) ×3 IMPLANT
CANISTER WOUNDNEG PRESSURE 500 (CANNISTER) ×3 IMPLANT
COVER SURGICAL LIGHT HANDLE (MISCELLANEOUS) ×3 IMPLANT
CUFF TOURNIQUET SINGLE 34IN LL (TOURNIQUET CUFF) ×3 IMPLANT
CUFF TOURNIQUET SINGLE 44IN (TOURNIQUET CUFF) IMPLANT
DRAPE INCISE IOBAN 66X45 STRL (DRAPES) ×3 IMPLANT
DRAPE U-SHAPE 47X51 STRL (DRAPES) ×3 IMPLANT
DRESSING PREVENA PLUS CUSTOM (GAUZE/BANDAGES/DRESSINGS) ×1 IMPLANT
DRSG PREVENA PLUS CUSTOM (GAUZE/BANDAGES/DRESSINGS) ×3
ELECT REM PT RETURN 9FT ADLT (ELECTROSURGICAL) ×3
ELECTRODE REM PT RTRN 9FT ADLT (ELECTROSURGICAL) ×1 IMPLANT
GLOVE BIOGEL PI IND STRL 9 (GLOVE) ×1 IMPLANT
GLOVE BIOGEL PI INDICATOR 9 (GLOVE) ×2
GLOVE SURG ORTHO 9.0 STRL STRW (GLOVE) ×3 IMPLANT
GOWN STRL REUS W/ TWL XL LVL3 (GOWN DISPOSABLE) ×2 IMPLANT
GOWN STRL REUS W/TWL XL LVL3 (GOWN DISPOSABLE) ×6
KIT BASIN OR (CUSTOM PROCEDURE TRAY) ×3 IMPLANT
KIT TURNOVER KIT B (KITS) ×3 IMPLANT
NS IRRIG 1000ML POUR BTL (IV SOLUTION) ×3 IMPLANT
PACK ORTHO EXTREMITY (CUSTOM PROCEDURE TRAY) ×3 IMPLANT
PAD ARMBOARD 7.5X6 YLW CONV (MISCELLANEOUS) ×3 IMPLANT
SPONGE LAP 18X18 X RAY DECT (DISPOSABLE) IMPLANT
STAPLER VISISTAT 35W (STAPLE) ×3 IMPLANT
STOCKINETTE IMPERVIOUS LG (DRAPES) ×3 IMPLANT
SUT SILK 2 0 (SUTURE) ×2
SUT SILK 2-0 18XBRD TIE 12 (SUTURE) ×1 IMPLANT
SUT VIC AB 1 CTX 27 (SUTURE) ×6 IMPLANT
TOWEL OR 17X26 10 PK STRL BLUE (TOWEL DISPOSABLE) ×3 IMPLANT

## 2018-02-01 NOTE — Transfer of Care (Signed)
Immediate Anesthesia Transfer of Care Note  Patient: Kathy Phelps  Procedure(s) Performed: LEFT BELOW KNEE AMPUTATION (Left )  Patient Location: PACU  Anesthesia Type:General  Level of Consciousness: awake, alert  and oriented  Airway & Oxygen Therapy: Patient Spontanous Breathing and Patient connected to nasal cannula oxygen  Post-op Assessment: Report given to RN, Post -op Vital signs reviewed and stable and Patient moving all extremities  Post vital signs: Reviewed and stable  Last Vitals:  Vitals Value Taken Time  BP 160/109 02/01/2018  3:26 PM  Temp    Pulse 85 02/01/2018  3:30 PM  Resp 19 02/01/2018  3:30 PM  SpO2 100 % 02/01/2018  3:30 PM  Vitals shown include unvalidated device data.  Last Pain:  Vitals:   02/01/18 1200  PainSc: 9       Patients Stated Pain Goal: 1 (90/12/22 4114)  Complications: No apparent anesthesia complications

## 2018-02-01 NOTE — Anesthesia Preprocedure Evaluation (Signed)
Anesthesia Evaluation  Patient identified by MRN, date of birth, ID band Patient awake    Reviewed: Allergy & Precautions, NPO status , Patient's Chart, lab work & pertinent test results, reviewed documented beta blocker date and time   History of Anesthesia Complications Negative for: history of anesthetic complications  Airway Mallampati: I  TM Distance: >3 FB Neck ROM: Full    Dental  (+) Poor Dentition, Loose, Missing, Chipped, Dental Advisory Given   Pulmonary asthma , COPD,  COPD inhaler, Current Smoker,    breath sounds clear to auscultation       Cardiovascular hypertension, Pt. on home beta blockers and Pt. on medications (-) angina Rhythm:Regular Rate:Normal  '18 ECHO: EF 50-55%, valves OK   Neuro/Psych  Headaches, Anxiety Depression Bipolar Disorder Schizophrenia CVA, No Residual Symptoms    GI/Hepatic Neg liver ROS, GERD  Medicated and Poorly Controlled,  Endo/Other  diabetes, Insulin Dependent, Oral Hypoglycemic Agents  Renal/GU Renal InsufficiencyRenal disease (creat 1.48)     Musculoskeletal   Abdominal   Peds  Hematology  (+) Blood dyscrasia (Hb 10.2), anemia ,   Anesthesia Other Findings   Reproductive/Obstetrics                             Anesthesia Physical  Anesthesia Plan  ASA: III  Anesthesia Plan: General   Post-op Pain Management:    Induction: Intravenous  PONV Risk Score and Plan: 1 and Ondansetron  Airway Management Planned: LMA and Oral ETT  Additional Equipment:   Intra-op Plan:   Post-operative Plan: Extubation in OR  Informed Consent: I have reviewed the patients History and Physical, chart, labs and discussed the procedure including the risks, benefits and alternatives for the proposed anesthesia with the patient or authorized representative who has indicated his/her understanding and acceptance.   Dental advisory given  Plan Discussed with:  CRNA, Surgeon and Anesthesiologist  Anesthesia Plan Comments:         Anesthesia Quick Evaluation

## 2018-02-01 NOTE — Progress Notes (Signed)
Patient given additional 50 mcg of fentanyl as ordered.  I am unable to document in [facility] dosage given.  DA

## 2018-02-01 NOTE — Progress Notes (Signed)
Pt arrived to room 5N05 via stretcher. Pt A&O x4. Pt tearful and anxious at this time, but emotional support provided to pt and pt reassured. Wound vac in place. No questions or concerns at this time. See assessment. Will continue to monitor.

## 2018-02-01 NOTE — Plan of Care (Signed)
Problem: Elimination: Goal: Will not experience complications related to urinary retention Outcome: Progressing  Pt able to void on her own at this time. Will continue to monitor.

## 2018-02-01 NOTE — Op Note (Signed)
   Date of Surgery: 02/01/2018  INDICATIONS: Kathy Phelps is a 37 y.o.-year-old female who has failed foot salvage intervention with partial foot amputation with progressive dehiscence of the wound with osteomyelitis.Marland Kitchen  PREOPERATIVE DIAGNOSIS: Osteomyelitis with wound dehiscence left foot  POSTOPERATIVE DIAGNOSIS: Same.  PROCEDURE: Transtibial amputation Application of Prevena wound VAC  SURGEON: Sharol Given, M.D.  ANESTHESIA:  general  IV FLUIDS AND URINE: See anesthesia.  ESTIMATED BLOOD LOSS: Minimal mL.,  Tourniquet time 4 minutes.  COMPLICATIONS: None.  DESCRIPTION OF PROCEDURE: The patient was brought to the operating room and underwent a general anesthetic. After adequate levels of anesthesia were obtained patient's lower extremity was prepped using DuraPrep draped into a sterile field. A timeout was called. The foot was draped out of the sterile field with impervious stockinette. A transverse incision was made 11 cm distal to the tibial tubercle. This curved proximally and a large posterior flap was created. The tibia was transected 1 cm proximal to the skin incision. The fibula was transected just proximal to the tibial incision. The tibia was beveled anteriorly. A large posterior flap was created. The sciatic nerve was pulled cut and allowed to retract. The vascular bundles were suture ligated with 2-0 silk. The deep and superficial fascial layers were closed using #1 Vicryl. The skin was closed using staples and 2-0 nylon. The wound was covered with a Prevena wound VAC. There was a good suction fit. A prosthetic shrinker was applied. Patient was extubated taken to the PACU in stable condition.   DISCHARGE PLANNING:  Antibiotic duration: 24 hours  Weightbearing: Nonweightbearing on the left.  Pain medication: 5 days ordered  Dressing care/ Wound VAC: Wound VAC to remain in place for 1 week  Discharge to: Skilled nursing  Follow-up: In the office 1 week post operative.  Meridee Score, MD [place] 3:15 PM

## 2018-02-01 NOTE — Anesthesia Procedure Notes (Signed)
Procedure Name: Intubation Date/Time: 02/01/2018 2:41 PM Performed by: Yessenia Maillet T, CRNA Pre-anesthesia Checklist: Patient identified, Emergency Drugs available, Suction available and Patient being monitored Patient Re-evaluated:Patient Re-evaluated prior to induction Oxygen Delivery Method: Circle system utilized Preoxygenation: Pre-oxygenation with 100% oxygen Induction Type: IV induction Ventilation: Mask ventilation without difficulty Laryngoscope Size: Miller and 2 Grade View: Grade I Tube type: Oral Tube size: 7.0 mm Number of attempts: 1 Airway Equipment and Method: Patient positioned with wedge pillow and Stylet Placement Confirmation: ETT inserted through vocal cords under direct vision,  positive ETCO2 and breath sounds checked- equal and bilateral Secured at: 22 cm Tube secured with: Tape Dental Injury: Teeth and Oropharynx as per pre-operative assessment

## 2018-02-01 NOTE — H&P (Signed)
Kathy Phelps is an 37 y.o. female.   Chief Complaint: Gangrene osteomyelitis left foot. HPI: Patient is a 37 year old woman with peripheral vascular disease still smoking has been using a nitroglycerin patch as well as Trental and doxycycline.  Patient states she has an odor she is still smoking she is down to half a pack a day.  Patient complains of pain that extends up to her ankle.    Past Medical History:  Diagnosis Date  . Anxiety   . Asthma   . Bipolar affective ([place])   . Complete miscarriage   . Constipation   . Depression   . Diabetes mellitus    Type II  . GERD (gastroesophageal reflux disease)   . Headache    "migraraines"  . Hyperlipidemia   . Hypertension   . Osteomyelitis ([place])    left foot  . Pregnancy complication    HELP Syndrome  . Renal disorder   . Schizophrenia ([place])   . Stroke [facility])    " mild stroke", memory loss- approx 2017    Past Surgical History:  Procedure Laterality Date  . ABDOMINAL AORTOGRAM N/A 09/07/2017   Procedure: ABDOMINAL AORTOGRAM;  Surgeon: Waynetta Sandy, MD;  Location: [place] CV LAB;  Service: Cardiovascular;  Laterality: N/A;  . ABDOMINAL AORTOGRAM W/LOWER EXTREMITY N/A 02/03/2017   Procedure: Abdominal Aortogram w/Lower Extremity;  Surgeon: Waynetta Sandy, MD;  Location: [place] CV LAB;  Service: Cardiovascular;  Laterality: N/A;  . AMPUTATION Left 02/10/2017   Procedure: AMPUTATION TOES 3, 4 AND 5  LEFT FOOT;  Surgeon: Waynetta Sandy, MD;  Location: [place];  Service: Vascular;  Laterality: Left;  . AMPUTATION Left 12/21/2017   Procedure: LEFT FOOT 5TH RAY AMPUTATION;  Surgeon: Newt Minion, MD;  Location: [place];  Service: Orthopedics;  Laterality: Left;  . CESAREAN SECTION    . CHOLECYSTECTOMY    . LOWER EXTREMITY ANGIOGRAPHY Bilateral 09/07/2017   Procedure: Lower Extremity Angiography;  Surgeon: Waynetta Sandy, MD;  Location: [place] CV LAB;  Service: Cardiovascular;   Laterality: Bilateral;  . PERIPHERAL VASCULAR ATHERECTOMY  02/03/2017   Procedure: Peripheral Vascular Atherectomy;  Surgeon: Waynetta Sandy, MD;  Location: [place] CV LAB;  Service: Cardiovascular;;  . PERIPHERAL VASCULAR BALLOON ANGIOPLASTY  02/03/2017   Procedure: Peripheral Vascular Balloon Angioplasty;  Surgeon: Waynetta Sandy, MD;  Location: [place] CV LAB;  Service: Cardiovascular;;  . PERIPHERAL VASCULAR INTERVENTION Left 09/07/2017   Procedure: PERIPHERAL VASCULAR INTERVENTION;  Surgeon: Waynetta Sandy, MD;  Location: [place] CV LAB;  Service: Cardiovascular;  Laterality: Left;  SFA/POPLITEAL  . TUBAL LIGATION      Family History  Problem Relation Age of Onset  . Diabetes Mellitus II Mother   . Heart disease Mother   . Heart disease Father    Social History:  reports that she has been smoking cigarettes.  She has a 28.00 pack-year smoking history. She has never used smokeless tobacco. She reports that she does not drink alcohol or use drugs.  Allergies:  Allergies  Allergen Reactions  . Hydrocodone-Acetaminophen Hives  . Tylenol [Acetaminophen] Itching, Swelling and Other (See Comments)    SWELLING REACTION UNSPECIFIED     No medications prior to admission.    No results found for this or any previous visit (from the past 48 hour(s)). No results found.  Review of Systems  All other systems reviewed and are negative.   Last menstrual period 01/28/2018. Physical Exam  Patient is  alert, oriented, no adenopathy, well-dressed, normal affect, normal respiratory effort. Examination patient has gangrenous changes to the surgical incision for fifth ray amputation left foot.  There is no ascending cellulitis there is a small amount of drainage.  Patient has good hair growth down to her mid tibia.  No signs of infection the calf the calf is soft nontender to palpation.  Any palpation around the foot is painful.   Assessment/Plan 1.  Amputated toe of left foot ([place])   2. Subacute osteomyelitis, left ankle and foot ([place])   3. Dehiscence of amputation stump ([place])     Plan: Discussed with patient options to continue wound care continue foot salvage intervention.  Patient states that she is having too much pain to consider foot salvage intervention.  we will plan for transtibial amputation .     Newt Minion, MD 02/01/2018, 6:50 AM

## 2018-02-02 ENCOUNTER — Encounter (HOSPITAL_COMMUNITY): Payer: Self-pay | Admitting: Orthopedic Surgery

## 2018-02-02 LAB — GLUCOSE, CAPILLARY
GLUCOSE-CAPILLARY: 129 mg/dL — AB (ref 65–99)
GLUCOSE-CAPILLARY: 145 mg/dL — AB (ref 65–99)
Glucose-Capillary: 138 mg/dL — ABNORMAL HIGH (ref 65–99)
Glucose-Capillary: 149 mg/dL — ABNORMAL HIGH (ref 65–99)

## 2018-02-02 NOTE — Progress Notes (Signed)
Orthopedic Tech Progress Note Patient Details:  Kathy Phelps Jun 29, 1981 [id number]  Patient ID: [name], female   DOB: 01/26/1981, 37 y.o.   MRN: [id number]   [place] Pink 02/02/2018, 9:20 AMCalled [employer] for stump shrinker.

## 2018-02-02 NOTE — Anesthesia Postprocedure Evaluation (Signed)
Anesthesia Post Note  Patient: Kathy Phelps  Procedure(s) Performed: LEFT BELOW KNEE AMPUTATION (Left )     Patient location during evaluation: PACU Anesthesia Type: General Level of consciousness: awake and alert Pain management: pain level controlled Vital Signs Assessment: post-procedure vital signs reviewed and stable Respiratory status: spontaneous breathing, nonlabored ventilation, respiratory function stable and patient connected to nasal cannula oxygen Cardiovascular status: blood pressure returned to baseline and stable Postop Assessment: no apparent nausea or vomiting Anesthetic complications: no    Last Vitals:  Vitals:   02/01/18 2034 02/02/18 0546  BP: (!) 166/101 (!) 145/91  Pulse: 85 85  Resp: 17   Temp: 36.8 C 36.7 C  SpO2: 96% 98%    Last Pain:  Vitals:   02/02/18 0546  TempSrc: Oral  PainSc:                  [name]

## 2018-02-02 NOTE — Progress Notes (Signed)
.  Patient ID: COZY VEALE, female   DOB: Oct 15, 1980, 37 y.o.   MRN: [id number] Postoperative day 1 left transtibial amputation.  Patient is sedated this morning she does respond appropriately.  The wound VAC has no drainage.  Plan for a stump protector and stump shrinker.  Discharge to skilled nursing pending evaluation by physical therapy

## 2018-02-02 NOTE — Evaluation (Signed)
Physical Therapy Evaluation Patient Details Name: Kathy Phelps MRN: [id number] DOB: Oct 23, 1980 Today's Date: 02/02/2018   History of Present Illness  Pt is a 37 y.o. female with PMH of uncontrolled DM, +smoker, bipolar, depression, renal disorder, schizophrenia, and PVD. 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     Clinical Impression  Pt admitted with above diagnosis. Pt currently with functional limitations due to the deficits listed below (see PT Problem List). On eval, pt required min assist bed mobility. Unable to progress beyond EOB due to lethargy and pain. Pt will benefit from skilled PT to increase their independence and safety with mobility to allow discharge to the venue listed below.       Follow Up Recommendations SNF;Supervision/Assistance - 24 hour    Equipment Recommendations  None recommended by PT    Recommendations for Other Services       Precautions / Restrictions Precautions Precautions: Fall Restrictions Weight Bearing Restrictions: Yes LLE Weight Bearing: Non weight bearing      Mobility  Bed Mobility Overal bed mobility: Needs Assistance Bed Mobility: Supine to Sit;Sit to Supine     Supine to sit: Min assist;HOB elevated Sit to supine: Min assist;HOB elevated   General bed mobility comments: +rail. Cues for sequencing. Pt moves slowly and needs to move at her own pace.  Transfers                 General transfer comment: unable due to pain  Ambulation/Gait             General Gait Details: unable  Stairs            Wheelchair Mobility    Modified Rankin (Stroke Patients Only)       Balance Overall balance assessment: Needs assistance Sitting-balance support: Bilateral upper extremity supported;Feet unsupported Sitting balance-Leahy Scale: Fair Sitting balance - Comments: BUE for support                                     Pertinent Vitals/Pain Pain Assessment:  0-10 Pain Score: 9  Pain Location: LLE Pain Descriptors / Indicators: Grimacing;Guarding;Crying;Moaning;Sharp Pain Intervention(s): Limited activity within patient's tolerance;Repositioned    Home Living Family/patient expects to be discharged to:: Private residence Living Arrangements: Children Available Help at Discharge: Family;Available PRN/intermittently Type of Home: House Home Access: Level entry     Home Layout: One level Home Equipment: Wheelchair - [employer] - 2 wheels      Prior Function Level of Independence: Needs assistance   Gait / Transfers Assistance Needed: wheelchair used for mobility. Pt self propels.  ADL's / Homemaking Assistance Needed: daughter assists with all ADLs. Pt sponge bathes due to inability to get into shower.        Hand Dominance        Extremity/Trunk Assessment   Upper Extremity Assessment Upper Extremity Assessment: Overall WFL for tasks assessed    Lower Extremity Assessment Lower Extremity Assessment: RLE deficits/detail;LLE deficits/detail RLE Deficits / Details: Able to move actively through full ROM  RLE: Unable to fully assess due to pain LLE Deficits / Details: limb guard in place. Pt unable to tolerate removal due to pain. LLE: Unable to fully assess due to pain;Unable to fully assess due to immobilization       Communication   Communication: No difficulties  Cognition Arousal/Alertness: Lethargic;Suspect due to medications Behavior During Therapy: Flat affect Overall Cognitive Status:  Difficult to assess                                 General Comments: Pt very sleepy requiring cues to stay awake. Daughter reports pt had recently had pain meds.      General Comments      Exercises     Assessment/Plan    PT Assessment Patient needs continued PT services  PT Problem List Decreased strength;Decreased mobility;Decreased knowledge of precautions;Decreased activity tolerance;Pain;Decreased  balance       PT Treatment Interventions DME instruction;Therapeutic activities;Therapeutic exercise;Gait training;Patient/family education;Balance training;Functional mobility training    PT Goals (Current goals can be found in the Care Plan section)  Acute Rehab PT Goals Patient Stated Goal: not stated PT Goal Formulation: With patient Time For Goal Achievement: 02/16/18 Potential to Achieve Goals: Fair    Frequency Min 3X/week   Barriers to discharge        Co-evaluation               AM-PAC PT "6 Clicks" Daily Activity  Outcome Measure Difficulty turning over in bed (including adjusting bedclothes, sheets and blankets)?: A Lot Difficulty moving from lying on back to sitting on the side of the bed? : Unable Difficulty sitting down on and standing up from a chair with arms (e.g., wheelchair, bedside commode, etc,.)?: Unable Help needed moving to and from a bed to chair (including a wheelchair)?: Total Help needed walking in hospital room?: Total Help needed climbing 3-5 steps with a railing? : Total 6 Click Score: 7    End of Session Equipment Utilized During Treatment: Other (comment)(limb guard) Activity Tolerance: Patient limited by pain;Patient limited by lethargy Patient left: in bed;with call bell/phone within reach;with family/visitor present Nurse Communication: Mobility status PT Visit Diagnosis: Other abnormalities of gait and mobility (R26.89);Pain Pain - Right/Left: Left Pain - part of body: Leg    Time: 5003-7048 PT Time Calculation (min) (ACUTE ONLY): 15 min   Charges:   PT Evaluation $PT Eval Moderate Complexity: 1 Mod     PT G Codes:        Lorrin Goodell, PT  Office # [phone] Pager [phone]   Lorriane Shire 02/02/2018, 3:09 PM

## 2018-02-03 LAB — GLUCOSE, CAPILLARY
GLUCOSE-CAPILLARY: 135 mg/dL — AB (ref 65–99)
GLUCOSE-CAPILLARY: 173 mg/dL — AB (ref 65–99)
GLUCOSE-CAPILLARY: 174 mg/dL — AB (ref 65–99)
Glucose-Capillary: 146 mg/dL — ABNORMAL HIGH (ref 65–99)

## 2018-02-03 NOTE — Progress Notes (Signed)
Physical Therapy Treatment Patient Details Name: Kathy Phelps MRN: [id number] DOB: 05-29-1981 Today's Date: 02/03/2018    History of Present Illness Pt is a 37 y.o. female with PMH of uncontrolled DM, +smoker, bipolar, depression, renal disorder, schizophrenia, and PVD. 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     PT Comments    Patient with mild progression this visit, session limited due to pain and lethargy. Able to tolerate standing and transferring to bedside chair, min A at all times to stabilize and prevent LOB. Patient refused ambulation or therex today citing pain.     Follow Up Recommendations  SNF;Supervision/Assistance - 24 hour     Equipment Recommendations  None recommended by PT    Recommendations for Other Services       Precautions / Restrictions Precautions Precautions: Fall Restrictions Weight Bearing Restrictions: Yes LLE Weight Bearing: Non weight bearing    Mobility  Bed Mobility Overal bed mobility: Needs Assistance Bed Mobility: Supine to Sit     Supine to sit: Min assist     General bed mobility comments: Min A to power trunk up from supine to sit, cues for hand rail  Transfers Overall transfer level: Needs assistance   Transfers: Sit to/from Stand;Stand Pivot Transfers Sit to Stand: Min assist Stand pivot transfers: Min assist       General transfer comment: Min A to help power up and stabilize. pt can tolerate 1 minute of standing, min A for safety during transfer to bedside chair. pt declined ambulation due to pain.  Ambulation/Gait             General Gait Details: unable pt declined due to pain   Stairs             Wheelchair Mobility    Modified Rankin (Stroke Patients Only)       Balance Overall balance assessment: Needs assistance Sitting-balance support: Bilateral upper extremity supported;Feet unsupported Sitting balance-Leahy Scale: Fair       Standing balance-Leahy  Scale: Poor                              Cognition Arousal/Alertness: Lethargic;Suspect due to medications Behavior During Therapy: Flat affect Overall Cognitive Status: Difficult to assess                                        Exercises      General Comments        Pertinent Vitals/Pain Pain Assessment: Faces Faces Pain Scale: Hurts whole lot Pain Location: LLE Pain Descriptors / Indicators: Grimacing;Guarding;Crying;Moaning;Sharp Pain Intervention(s): Limited activity within patient's tolerance;Monitored during session;Premedicated before session;Repositioned    Home Living                      Prior Function            PT Goals (current goals can now be found in the care plan section) Acute Rehab PT Goals Patient Stated Goal: not stated PT Goal Formulation: With patient Time For Goal Achievement: 02/16/18 Potential to Achieve Goals: Fair Progress towards PT goals: Progressing toward goals    Frequency    Min 3X/week      PT Plan Current plan remains appropriate    Co-evaluation              AM-PAC PT "  6 Clicks" Daily Activity  Outcome Measure  Difficulty turning over in bed (including adjusting bedclothes, sheets and blankets)?: A Lot Difficulty moving from lying on back to sitting on the side of the bed? : Unable Difficulty sitting down on and standing up from a chair with arms (e.g., wheelchair, bedside commode, etc,.)?: Unable Help needed moving to and from a bed to chair (including a wheelchair)?: A Little Help needed walking in hospital room?: Total Help needed climbing 3-5 steps with a railing? : Total 6 Click Score: 9    End of Session Equipment Utilized During Treatment: Gait belt Activity Tolerance: Patient limited by pain;Patient limited by lethargy Patient left: in bed;with call bell/phone within reach;with family/visitor present Nurse Communication: Mobility status PT Visit Diagnosis: Other  abnormalities of gait and mobility (R26.89);Pain Pain - Right/Left: Left Pain - part of body: Leg     Time: 1315-1335 PT Time Calculation (min) (ACUTE ONLY): 20 min  Charges:  $Therapeutic Activity: 8-22 mins                    G Codes:       Reinaldo Berber, PT, DPT Acute Rehab Services Pager: [phone]     Reinaldo Berber 02/03/2018, 1:45 PM

## 2018-02-03 NOTE — Progress Notes (Signed)
Patient ID: Kathy Phelps, female   DOB: 1981-03-04, 37 y.o.   MRN: [id number] Patient is  2 days postoperative transtibial amputation.  There is no drainage in the wound VAC.  Patient has not participated much with physical therapy.  Plan for discharge to skilled nursing on Monday.

## 2018-02-03 NOTE — Progress Notes (Signed)
Inpatient Diabetes Program Recommendations  AACE/ADA: New Consensus Statement on Inpatient Glycemic Control (2015)  Target Ranges:  Prepandial:   less than 140 mg/dL      Peak postprandial:   less than 180 mg/dL (1-2 hours)      Critically ill patients:  140 - 180 mg/dL   Lab Results  Component Value Date   GLUCAP 173 (H) 02/03/2018   HGBA1C 9.1 (H) 02/01/2018    Review of Glycemic Control Results for ANGELLEE, COHILL (MRN [id number]) as of 02/03/2018 15:33  Ref. Range 02/02/2018 22:45 02/03/2018 06:36 02/03/2018 11:19  Glucose-Capillary Latest Ref Range: 65 - 99 mg/dL 145 (H) 135 (H) 173 (H)   Diabetes history: Type 2 DM Outpatient Diabetes medications: Lantus 20 units in AM, 30 units in PM, Novolog 0-30 units SSI, Metformin 1500 mg BID Current orders for Inpatient glycemic control: Novolog 0-15 units TID, Novolog 4 units TID, Lantus 30 units QHS  Inpatient Diabetes Program Recommendations:    Attempted to speak with patient regarding outpatient diabetes regimen. Patient does not have meter and will need a new one at discharge. Encouraged patient to begin writing down BS and take to next PCP visit.  Meter kit (includes lancets and test strips) ([id number])  Reviewed patient's current A1c of 9.1%. Explained what a A1c is and what it measures. Also reviewed goal A1c with patient, importance of good glucose control @ home, and blood sugar goals. Reviewed basic patho, need for insulin. Patient feels that insulin regimen needs to be adjusted. Encouraged to start checking BS twice a day and follow up with PCP.  Does not have PCP, will place case management consult.  No additional questions or concerns related to diabetes management.  Thanks, Bronson Curb, MSN, RNC-OB Diabetes Coordinator [phone] (8a-5p)

## 2018-02-04 LAB — GLUCOSE, CAPILLARY
GLUCOSE-CAPILLARY: 182 mg/dL — AB (ref 65–99)
GLUCOSE-CAPILLARY: 133 mg/dL — AB (ref 65–99)
Glucose-Capillary: 158 mg/dL — ABNORMAL HIGH (ref 65–99)

## 2018-02-04 MED ORDER — HYDROMORPHONE HCL 2 MG/ML IJ SOLN
0.5000 mg | INTRAMUSCULAR | Status: DC | PRN
  Administered 2018-02-04 – 2018-02-05 (×2): 0.5 mg via INTRAVENOUS
  Filled 2018-02-04 (×2): qty 1

## 2018-02-04 NOTE — Progress Notes (Signed)
Attempt to assess patient, however she continued to fall asleep. Told her we could discuss closer to DC, she stated, "OK." Patient will default to [facility] for [facility] services including PT. CM will continue to follow.

## 2018-02-04 NOTE — Clinical Social Work Note (Signed)
Pt states she wants to go home with [facility]. Pt states her 39 year old nephew will be there with her 24/7. RNCM contacted.   [place], [place]

## 2018-02-04 NOTE — Progress Notes (Signed)
   Subjective: 3 Days Post-Op Procedure(s) (LRB): LEFT BELOW KNEE AMPUTATION (Left) Patient reports pain as moderate and severe.  Had eyes closed when I walked into room. Sedated and slurring her words. " this pain medication does not work very well"  Objective: Vital signs in last 24 hours: Temp:  [98.6 F (37 C)-99.3 F (37.4 C)] 98.6 F (37 C) (04/27 1142) Pulse Rate:  [90-106] 90 (04/27 1142) Resp:  [10-17] 10 (04/27 1142) BP: (101-123)/(69-82) 107/77 (04/27 1142) SpO2:  [94 %-100 %] 100 % (04/27 1142)  Intake/Output from previous day: 04/26 0701 - 04/27 0700 In: 660.8 [P.O.:480; I.V.:180.8] Out: 400 [Urine:400] Intake/Output this shift: Total I/O In: 319.2 [P.O.:240; I.V.:79.2] Out: -   No results for input(s): HGB in the last 72 hours. No results for input(s): WBC, RBC, HCT, PLT in the last 72 hours. No results for input(s): NA, K, CL, CO2, BUN, CREATININE, GLUCOSE, CALCIUM in the last 72 hours. No results for input(s): LABPT, INR in the last 72 hours.  dressing dry No results found.  Assessment/Plan: 3 Days Post-Op Procedure(s) (LRB): LEFT BELOW KNEE AMPUTATION (Left) Discharge to SNF likely Monday  Kathy Phelps 02/04/2018, 12:01 PM

## 2018-02-05 LAB — GLUCOSE, CAPILLARY
GLUCOSE-CAPILLARY: 123 mg/dL — AB (ref 65–99)
GLUCOSE-CAPILLARY: 152 mg/dL — AB (ref 65–99)
GLUCOSE-CAPILLARY: 73 mg/dL (ref 65–99)
Glucose-Capillary: 80 mg/dL (ref 65–99)
Glucose-Capillary: 192 mg/dL — ABNORMAL HIGH (ref 65–99)

## 2018-02-05 MED ORDER — IBUPROFEN 200 MG PO TABS
600.0000 mg | ORAL_TABLET | Freq: Three times a day (TID) | ORAL | Status: DC
  Administered 2018-02-05 – 2018-02-06 (×4): 600 mg via ORAL
  Filled 2018-02-05 (×4): qty 3

## 2018-02-05 MED ORDER — ALPRAZOLAM 0.5 MG PO TABS: 1.0000 mg | ORAL_TABLET | Freq: Two times a day (BID) | ORAL | Status: DC | PRN

## 2018-02-05 MED ORDER — NALOXONE HCL 0.4 MG/ML IJ SOLN
INTRAMUSCULAR | Status: AC
  Administered 2018-02-05: 0.4 ug/mL
  Filled 2018-02-05: qty 1

## 2018-02-05 MED ORDER — GABAPENTIN 400 MG PO CAPS
400.0000 mg | ORAL_CAPSULE | Freq: Two times a day (BID) | ORAL | Status: DC
  Administered 2018-02-05 – 2018-02-06 (×2): 400 mg via ORAL
  Filled 2018-02-05 (×2): qty 1

## 2018-02-05 MED ORDER — ALPRAZOLAM 0.5 MG PO TABS: 2.0000 mg | ORAL_TABLET | Freq: Two times a day (BID) | ORAL | Status: DC | PRN

## 2018-02-05 MED ORDER — KETOROLAC TROMETHAMINE 30 MG/ML IJ SOLN
30.0000 mg | Freq: Three times a day (TID) | INTRAMUSCULAR | Status: DC | PRN
Start: 2018-02-05 — End: 2018-02-06
  Administered 2018-02-05: 30 mg via INTRAVENOUS
  Filled 2018-02-05 (×2): qty 1

## 2018-02-05 NOTE — Progress Notes (Signed)
During my morning assessment patient appeared to be heavily sedated. She was unable to answer questions appropriately and fell asleep multiple times during assessment. Vital signs were obtained BP/HR and temp WNL. While patient was asleep her respirations were 6 and oxygenation dropped to 83%. While patient was being aroused and forced to stay awake her oxygen would increase to 95% and respirations 12.  Dr. Lorin Mercy called and notified of patient's condition. MD advised me to hold off on narcan at this time. Pain and anxiety medications were adjusted, oxygen by nasal canula and continuous pulse ox was applied. Family at bedside educated on the continuous pulse ox. They were asked to wake patient up and call the front desk if pulse ox alarm sounds.   Will continue to monitor patient and hold off on pain medication until patient is more alert and oriented.

## 2018-02-05 NOTE — Progress Notes (Addendum)
   Subjective: 4 Days Post-Op Procedure(s) (LRB): LEFT BELOW KNEE AMPUTATION (Left) Patient reports pain as zero. Oversedated and falls asleep. She states she can't see , both pupils small and minimal react to light. Had been on xanax 2mg  4X per day and oxycodone.   Objective: Vital signs in last 24 hours: Temp:  [98.5 F (36.9 C)-99.2 F (37.3 C)] 98.5 F (36.9 C) (04/28 0546) Pulse Rate:  [87-101] 89 (04/28 1158) Resp:  [8] 8 (04/28 1158) BP: (117-136)/(78-102) 136/102 (04/28 1158) SpO2:  [95 %-100 %] 100 % (04/28 1158)  Intake/Output from previous day: 04/27 0701 - 04/28 0700 In: 472 [P.O.:240; I.V.:232] Out: 0  Intake/Output this shift: Total I/O In: 120 [P.O.:120] Out: -   No results for input(s): HGB in the last 72 hours. No results for input(s): WBC, RBC, HCT, PLT in the last 72 hours. No results for input(s): NA, K, CL, CO2, BUN, CREATININE, GLUCOSE, CALCIUM in the last 72 hours. No results for input(s): LABPT, INR in the last 72 hours.  groggy, does not react to pain. pupils small and react to light minimally.  No results found. CBG 80.  Assessment/Plan: 4 Days Post-Op Procedure(s) (LRB): LEFT BELOW KNEE AMPUTATION (Left) Plan: neurology called, they will see. Narcotic meds stopped. Decreased Xanax from 4X per day to bid earlier this AM. Will give narcan dose and fluid bolus.  Oversedation from polypharmacy.   Marybelle Killings 02/05/2018, 12:04 PM  Addendum: more awake after narcan , responds to pain. States vision is good now. Vitals stable  With response from narcan called Neurology and cancelled consult. Reduced xanax, neurontin. Has tylenol/advil and can use torodol IV q 6 hrs for breakthru pain.

## 2018-02-05 NOTE — Progress Notes (Signed)
Patient states social services will take her children if they miss on day of school and now she wants to leave AMA. Dr. Lorin Mercy call, awaiting return phone call.

## 2018-02-06 LAB — GLUCOSE, CAPILLARY
GLUCOSE-CAPILLARY: 66 mg/dL (ref 65–99)
GLUCOSE-CAPILLARY: 79 mg/dL (ref 65–99)
Glucose-Capillary: 83 mg/dL (ref 65–99)

## 2018-02-06 MED ORDER — OXYCODONE-ACETAMINOPHEN 5-325 MG PO TABS: 1.0000 | ORAL_TABLET | ORAL | 0 refills | Status: DC | PRN

## 2018-02-06 NOTE — Evaluation (Signed)
Occupational Therapy Evaluation Patient Details Name: Kathy Phelps MRN: [id number] DOB: 26-Oct-1980 Today's Date: 02/06/2018    History of Present Illness Pt is a 37 y.o. female with PMH of uncontrolled DM, +smoker, bipolar, depression, renal disorder, schizophrenia, and PVD. 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    Clinical Impression   PTA, pt reports to OT that she was independent from w/c level for basic ADL but that her nephew has been carrying her into and out of the bathroom as w/c will not fit. Note discrepancy in report as compared to PT evaluation as pt then stating she required assistance for ADL. Pt educated concerning safe transfer to and from [facility] as well as safety precautions utilizing w/c for ADL participation. Educated pt concerning need for hands on 24 hour assistance as she requires mod assist for LB ADL, supervision for UB ADL, and min-mod assist for toilet transfers and hygiene. She became very lethargic at end of session requiring cues to maintain alert state. Pt would benefit from continued OT services while admitted to improve independence and safety post-acute D/C. Agree with PT that pt would best benefit from short-term SNF level rehabilitation. She began session adamant that she would return home and OT focused on home education. However, at end of session, pt reporting "I'm not sure I can do all of this at home" and considering rehabilitation. Notified RN. If pt is to D/C home, recommend 24 hour hands on assistance, 3-in-1 BSC, RW, and HHOT follow-up to maximize safety and independence.     Follow Up Recommendations  SNF;Home health OT;Supervision/Assistance - 24 hour(If pt declines SNF will need 24 hour assist with HHOT)    Equipment Recommendations       Recommendations for Other Services       Precautions / Restrictions Precautions Precautions: Fall Restrictions Weight Bearing Restrictions: Yes LLE Weight Bearing: Non weight bearing       Mobility Bed Mobility Overal bed mobility: Needs Assistance Bed Mobility: Supine to Sit     Supine to sit: Supervision     General bed mobility comments: Supervision for safety.   Transfers Overall transfer level: Needs assistance Equipment used: Rolling walker (2 wheeled) Transfers: Sit to/from [facility] Sit to Stand: Mod assist;Min assist Stand pivot transfers: Min assist       General transfer comment: Mod assist initially to power up to standing from bed surface. However, able to complete stand-pivot transfer with min assist and power up from [facility] with min assist as well.     Balance Overall balance assessment: Needs assistance Sitting-balance support: Bilateral upper extremity supported;Feet unsupported Sitting balance-Kathy Phelps Scale: Fair Sitting balance - Comments: BUE for support   Standing balance support: Bilateral upper extremity supported;No upper extremity supported;Single extremity supported Standing balance-Kathy Phelps Scale: Poor Standing balance comment: Relies on at least single UE support.                            ADL either performed or assessed with clinical judgement   ADL Overall ADL's : Needs assistance/impaired Eating/Feeding: Set up;Sitting   Grooming: Set up;Sitting   Upper Body Bathing: Supervision/ safety;Sitting Upper Body Bathing Details (indicate cue type and reason): supervision as pt is falling asleep Lower Body Bathing: Moderate assistance;Sit to/from stand   Upper Body Dressing : Supervision/safety;Sitting   Lower Body Dressing: Moderate assistance;Sit to/from stand   Toilet Transfer: Minimal assistance;Moderate assistance;Stand-pivot;RW;BSC Toilet Transfer Details (indicate cue type and  reason): Initially mod assist to power up to standing position with 2 attempts required. Min assist to power up from [facility] as this was a more stable surface.  Toileting- Clothing Manipulation and Hygiene: Minimal  assistance;Sit to/from stand Toileting - Clothing Manipulation Details (indicate cue type and reason): Min assist to maintain stability.      Functional mobility during ADLs: Rolling walker;Minimal assistance;Moderate assistance General ADL Comments: Pt educated concerning use of 3-in-1 for toileting tasks, need for wash-ups until cleared for showering, and need for 24 hour assistance during all ADL tasks. Pt initially reporting concern that she would not be able to care for her children. However, unsure if pt is safe to care for them at current functional level. Pt reports feeling less comfortable returning home after working with OT and now considering SNF. Notified Therapist, sports and Education officer, museum.      Vision Patient Visual Report: No change from baseline Vision Assessment?: No apparent visual deficits     Perception     Praxis      Pertinent Vitals/Pain Pain Assessment: 0-10 Pain Score: 9 (although pt falling asleep during session) Pain Descriptors / Indicators: Operative site guarding;Sore Pain Intervention(s): Monitored during session;Repositioned     Hand Dominance     Extremity/Trunk Assessment Upper Extremity Assessment Upper Extremity Assessment: Overall WFL for tasks assessed   Lower Extremity Assessment Lower Extremity Assessment: Defer to PT evaluation LLE Deficits / Details: limb guard in room but unable to straighten LLE enough to don at this time and educated pt on importance to maintain as much LLE extension as possible LLE: Unable to fully assess due to pain;Unable to fully assess due to immobilization       Communication Communication Communication: No difficulties   Cognition Arousal/Alertness: Lethargic;Suspect due to medications Behavior During Therapy: Flat affect Overall Cognitive Status: Difficult to assess Area of Impairment: Attention;Safety/judgement;Awareness;Problem solving                   Current Attention Level: Selective      Safety/Judgement: Decreased awareness of safety;Decreased awareness of deficits Awareness: Emergent Problem Solving: Slow processing General Comments: Pt lethargic at end of session. She was able to initially converse and maintain attention to conversation but becoming lethargic and falling asleep on BSC with inability to maintain awake state longer than a few seconds by end of session.    General Comments  Pt's significant other present during session but speaks minimal [place]. Pt educated concerning need for hands on 24 hour assistance. Pt also falling asleep at end of session and requiring cues to maintain alert state. Concern that without close assistance at all times even when seated in w/c, pt may fall asleep and be at risk of falling from chair.     Exercises     Shoulder Instructions      Home Living Family/patient expects to be discharged to:: Private residence Living Arrangements: Children;Spouse/significant other(nephew) Available Help at Discharge: Family;Available PRN/intermittently Type of Home: Mobile home Home Access: Level entry     Home Layout: One level     Bathroom Shower/Tub: Teacher, early years/pre: Standard     Home Equipment: Wheelchair - manual          Prior Functioning/Environment Level of Independence: Needs assistance  Gait / Transfers Assistance Needed: wheelchair used for mobility. Pt self propels. ADL's / Homemaking Assistance Needed: Pt reports nephew carries her from wheelchair into bathroom. She reports independence with basic ADL to OT although note reported needing  assistance to PT.            OT Problem List: Decreased strength;Decreased activity tolerance;Impaired balance (sitting and/or standing);Decreased knowledge of use of DME or AE;Decreased knowledge of precautions;Decreased cognition;Decreased safety awareness;Pain      OT Treatment/Interventions: Self-care/ADL training;Therapeutic exercise;Energy  conservation;DME and/or AE instruction;Therapeutic activities;Patient/family education;Balance training;Cognitive remediation/compensation    OT Goals(Current goals can be found in the care plan section) Acute Rehab OT Goals Patient Stated Goal: not stated OT Goal Formulation: With patient Time For Goal Achievement: 02/20/18 Potential to Achieve Goals: Good ADL Goals Pt Will Perform Grooming: with modified independence;sitting Pt Will Perform Lower Body Dressing: with modified independence;sit to/from stand Pt Will Transfer to Toilet: with modified independence;stand pivot transfer;bedside commode Pt Will Perform Toileting - Clothing Manipulation and hygiene: with modified independence;sit to/from stand  OT Frequency: Min 2X/week   Barriers to D/C:            Co-evaluation              AM-PAC PT "6 Clicks" Daily Activity     Outcome Measure Help from another person eating meals?: A Little Help from another person taking care of personal grooming?: A Little Help from another person toileting, which includes using toliet, bedpan, or urinal?: A Little Help from another person bathing (including washing, rinsing, drying)?: A Lot Help from another person to put on and taking off regular upper body clothing?: A Little Help from another person to put on and taking off regular lower body clothing?: A Lot 6 Click Score: 16   End of Session Equipment Utilized During Treatment: Gait belt;Rolling walker Nurse Communication: Mobility status  Activity Tolerance: Patient tolerated treatment well Patient left: in chair;with call bell/phone within reach;with family/visitor present  OT Visit Diagnosis: Other abnormalities of gait and mobility (R26.89);Pain Pain - Right/Left: Left Pain - part of body: Leg(residual limb)                Time: 2979-8921 OT Time Calculation (min): 20 min Charges:  OT General Charges $OT Visit: 1 Visit OT Evaluation $OT Eval Moderate Complexity: 1  Mod G-Codes:     Norman Herrlich, MS OTR/L  Pager: [place] A Sidonie Dexheimer 02/06/2018, 12:23 PM

## 2018-02-06 NOTE — Care Management Note (Signed)
Case Management Note  Patient Details  Name: Kathy Phelps MRN: [id number] Date of Birth: [birth year]  Subjective/Objective:  Pt is a 37 y.o. female with PMH of uncontrolled DM, +smoker, bipolar, depression, renal disorder, schizophrenia, and PVD. 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  PTA, pt needs assistance with ADLS, lives with significant other.                    Action/Plan: PT/OT recommending SNF, though pt refusing.  Will arrange [place] follow up, and DME, as recommended by therapy.  Referral to [facility], per pt choice; start of care 24-48h post dc date.  DME delivered to pt's room prior to dc.    Expected Discharge Date:  02/06/18               Expected Discharge Plan:  [place]  In-House Referral:     Discharge planning Services  CM Consult  Post Acute Care Choice:  Home Health Choice offered to:  Patient  DME Arranged:  3-N-1, Walker rolling DME Agency:  [place]:  PT, OT [facility] Agency:  [place]  Status of Service:  Completed, signed off  If discussed at [place] of Stay Meetings, dates discussed:    Additional Comments:  Reinaldo Raddle, RN, BSN  Trauma/Neuro ICU Case Manager [phone]

## 2018-02-06 NOTE — Discharge Instructions (Signed)
Stump and Prosthesis Care When an arm or leg is removed, it is important to care for the artificial body part that replaces it (prosthesis) and for the remaining end of the arm or leg (stump). Caring for the stump and prosthesis will help you to be comfortable, active, and healthy. How to care for your stump Cleaning Your Skin  Wash your stump with a mild antibacterial soap at least once per day.  Wash your stump after getting dirty or sweaty.  After washing your stump, pat it dry and let it air-dry for 5-10 minutes.  Do not soak your stump in a warm or hot bath for longer than 20 minutes at a time.  Avoid shaving hair on the stump. Hair that grows out after being shaved is more easily irritated by the prosthesis. Using Skin Care Products  Apply ointment to your surgical scar if your health care provider instructed you to do so. This can keep the scar soft and help it heal.  Do not put creams and lotions on your stump unless your health care provider says it is okay. If your health care provider says it is okay to put creams and lotions on your stump, do not use lotions that contain petroleum jelly.  Do not use skin care products with an alcohol base. These products can be harmful to your skin. They can also damage the lining of the prosthesis.  Consider using an antiperspirant spray on the skin of the stump if you are prone to sweating. Other Instructions  Every day, look closely at the skin on your stump. Use a mirror with a long handle to check areas you cannot see, or ask a friend or family member to check those areas. Look for areas that appear reddish, swollen, or irritated. Pay extra attention to places where the stump and prosthesis rub together. How to care for your prosthesis Cleaning Your Prosthesis  Use hot water and antibacterial soap to wash your prosthesis. Attaching Your Prosthesis  Make sure your prosthesis is clean before you attach it to your stump. All the parts  that touch your skin should be clean and dry.  Be sure you understand how to attach the prosthesis. A prosthetic specialist (prosthetist) can show you how to do this. It is a good idea to practice several times while he or she watches.  If you were given wraps or socks to wear under the prosthesis, make sure to wear them. Other Instructions  Exercise and move your prosthesis as recommended by your physical therapist.  Follow your health care provider's instructions about the length of time you should wear your prosthesis. You will likely need to limit the amount of time you wear your prosthesis at first. You may be instructed to increase the time you wear your prosthesis a little bit each day. Contact a health care provider if:  The prosthesis does not seem to fit correctly.  You have an itchy rash or a sore on your stump.  Sweating between the stump and the prosthesis is heavy and efforts to control the sweating do not work. Get help right away if:  Your stump is red, swollen, painful to the touch, or hot.  A bad smell develops around the stump.  There is a sore on your stump that is not healing.  Your stump is colder than the upper part of the limb.  Skin on your stump turns gray or black.  There is any drainage coming from your stump. This information  is not intended to replace advice given to you by your health care provider. Make sure you discuss any questions you have with your health care provider. Document Released: 12/22/2009 Document Revised: 05/23/2016 Document Reviewed: 09/23/2014 Elsevier Interactive Patient Education  2018 [place] With an Amputation The most common causes of amputation from the hip down include:  Diseases that: ? Reduce the blood flow to an area of your body. ? Decrease your body's ability to fight infection.  Traumatic injuries that cause significant damage to body tissues.  Birth defects.  Cancerous lumps (malignant  tumors). Amputation above the hip is usually the result of trauma or birth defect. Disease is a less common cause. Living with an amputation can be challenging, but you can still live a long, productive life. WHAT ARE THE COMMON CHALLENGES OF LIVING WITH AN AMPUTATION? The most common challenges are mobility and self-care. With some new habits, though, it is often possible to do all of the activities you used to do. You may be able to use a device that substitutes for your limb (prosthesis). The prosthesis helps you adapt more quickly to these challenges. Your health care provider can help select a prosthesis to meet your needs. A person who helps you choose and fits you with a prosthesis (prosthetist) may also help. A rehabilitation program can also help you gain mobility and self-reliance. Your rehabilitation team may include:  Physicians.  Physical and occupational therapists.  Prosthetists.  Nurses.  Social workers.  Psychologists.  Dietitians. Your rehabilitation team will help you with all aspects of recovery and returning to work, home, sports, and your community. You will learn to:  Get around safely.  Adjust your home.  Exercise.  Use a prosthetic.  Work through Financial risk analyst.  Connect with other people who have gone through the same experience. Additional challenges may include:  Grieving period.  Body image issues.  Lifestyle issues, such as sex.  Maintaining a healthy weight. These issues are normal. Discuss these with your rehabilitation team. WHEN CAN I RETURN TO MY REGULAR ACTIVITIES?  Returning to your normal activities is part of healing. Changes can often be made to equipment that allow you to return to a sport or hobby. Some Teacher, English as a foreign language for this. Discuss all of your leisure interests with your health care provider and prosthetist. WHEN CAN I RETURN TO WORK? When you are ready to return to work, your therapists can perform  job site evaluations and make recommendations to help you perform your job. You may not be able to return to your same job. Your local Office of Vocational Rehabilitation can assist you in job retraining. FOR MORE INFORMATION: Visit these online resources. You can find tips on everything from getting dressed and using bathrooms to driving and travel considerations. These resources can also connect you to a network of emotional support, activities, and innovations. You may also search the Internet to find a local support group.  Amputee Coalition: http://www.amputee-coalition.org/ensuring-fall-safety/  Amputee Support Groups: http://amputee.supportgroups.com  Daily Strength: ItCheaper.dk  [employer]: http://www.nationalamputation.[facility] on Health, Physical Activity and Disability: http://www.nchpad.org  Disabled [employer] [place]: http://www.disabledsportsusa.org  American Academy of Orthotists and Prosthetists: http://www.oandp.org This information is not intended to replace advice given to you by your health care provider. Make sure you discuss any questions you have with your health care provider. Document Released: 06/19/2002 Document Revised: 10/18/2014 Document Reviewed: 02/12/2014 Elsevier Interactive Patient Education  2017 [employer].

## 2018-02-06 NOTE — Plan of Care (Signed)
Pt able to verbalize plan for DM at discharge.

## 2018-02-06 NOTE — Progress Notes (Signed)
Patient discharged home with spouse. AVS went over with and given to patient. Signed copy in chart.  Patient taken to discharge lobby via home wheelchair by RN and Spouse.  Patient was delivered a 3 in 1 walker and bedside commode which was also taken to d/c lobby.   Vitals:   02/05/18 2102 02/06/18 0421  BP:  134/90  Pulse:  79  Resp:  13  Temp:  97.9 F (36.6 C)  SpO2: 95% 100%    Julieanne Cotton, RN

## 2018-02-06 NOTE — Progress Notes (Deleted)
Discharge to: Home with spouse  AVS went over with and given to patient.   There were no further questions at this time. Patient verbalized understanding of follow-up appointments.

## 2018-02-06 NOTE — Plan of Care (Signed)
Pt to be discharged home with home health. Pt able to verbalize needs for home. Pt is able to maintain safety status at this time.

## 2018-02-06 NOTE — Discharge Summary (Signed)
Discharge Diagnoses:  Active Problems:   Gangrene of left foot ([place])   History of left below knee amputation [facility])   Surgeries: Procedure(s): LEFT BELOW KNEE AMPUTATION on 02/01/2018    Consultants:   Discharged Condition: Improved  Hospital Course: Kathy Phelps is an 37 y.o. female who was admitted 02/01/2018 with a chief complaint of gangrene and osteomyelitis left foot, with a final diagnosis of Gangrene Left Foot.  Patient was brought to the operating room on 02/01/2018 and underwent Procedure(s): LEFT BELOW KNEE AMPUTATION.    Patient was given perioperative antibiotics:  Anti-infectives (From admission, onward)   Start     Dose/Rate Route Frequency Ordered Stop   02/01/18 2100  ceFAZolin (ANCEF) IVPB 1 g/50 mL premix     1 g 100 mL/hr over 30 Minutes Intravenous Every 6 hours 02/01/18 1739 02/02/18 1128   02/01/18 1050  ceFAZolin (ANCEF) 2-4 GM/100ML-% IVPB    Note to Pharmacy:  Ardine Eng   : cabinet override      02/01/18 1050 02/01/18 1445   02/01/18 1041  ceFAZolin (ANCEF) IVPB 2g/100 mL premix     2 g 200 mL/hr over 30 Minutes Intravenous On call to O.R. 02/01/18 1041 02/01/18 1445    .  Patient was given sequential compression devices, early ambulation, and aspirin for DVT prophylaxis.  Recent vital signs:  Patient Vitals for the past 24 hrs:  BP Temp Temp src Pulse Resp SpO2  02/06/18 0421 134/90 97.9 F (36.6 C) Oral 79 13 100 %  02/05/18 2102 - - - - - 95 %  02/05/18 1951 (!) 99/57 - - 83 15 97 %  02/05/18 1214 (!) 119/98 - - - - -  02/05/18 1158 (!) 136/102 - - 89 (!) 8 100 %  02/05/18 0917 - - - - - 100 %  02/05/18 0833 117/78 - - 87 (!) 8 95 %  .  Recent laboratory studies: No results found.  Discharge Medications:   Allergies as of 02/06/2018      Reactions   Hydrocodone-acetaminophen Hives   Tylenol [acetaminophen] Itching, Swelling, Other (See Comments)   SWELLING REACTION UNSPECIFIED       Medication List    TAKE these medications    ADVAIR DISKUS 100-50 MCG/DOSE Aepb Generic drug:  Fluticasone-Salmeterol Inhale 1 puff into the lungs daily.   alprazolam 2 MG tablet Commonly known as:  XANAX Take 1 tablet (2 mg total) by mouth 4 (four) times daily as needed for sleep. What changed:    how much to take  when to take this   clopidogrel 75 MG tablet Commonly known as:  PLAVIX Take 1 tablet (75 mg total) by mouth daily with breakfast.   doxycycline 100 MG capsule Commonly known as:  VIBRAMYCIN Take 1 capsule (100 mg total) by mouth 2 (two) times daily.   famotidine 20 MG tablet Commonly known as:  PEPCID Take 1 tablet (20 mg total) by mouth 2 (two) times daily.   gabapentin 800 MG tablet Commonly known as:  NEURONTIN Take 1 tablet (800 mg total) by mouth 2 (two) times daily. What changed:  when to take this   insulin glargine 100 UNIT/ML injection Commonly known as:  LANTUS Inject 0.2-0.3 mLs (20-30 Units total) into the skin 2 (two) times daily. 20 U in the morning and 30 U in the evening What changed:    when to take this  additional instructions   INSULIN SYRINGE .5CC/31GX5/16" 31G X 5/16" 0.5 ML Misc 1 each  by Does not apply route QID.   LATUDA 120 MG Tabs Generic drug:  Lurasidone HCl Take 120 mg by mouth at bedtime.   metFORMIN 1000 MG tablet Commonly known as:  GLUCOPHAGE Take 1 tablet (1,000 mg total) by mouth 2 (two) times daily. What changed:  how much to take   metoCLOPramide 5 MG tablet Commonly known as:  REGLAN Take 1 tablet (5 mg total) by mouth 3 (three) times daily before meals.   metoprolol tartrate 25 MG tablet Commonly known as:  LOPRESSOR Take 0.5 tablets (12.5 mg total) by mouth 2 (two) times daily.   mirtazapine 15 MG tablet Commonly known as:  REMERON Take 15 mg by mouth at bedtime.   nicotine 21 mg/24hr patch Commonly known as:  NICODERM CQ - dosed in mg/24 hours Place 1 patch (21 mg total) onto the skin daily.   nitroGLYCERIN 0.2 mg/hr patch Commonly  known as:  NITRO-DUR Place 1 patch (0.2 mg total) onto the skin daily.   NOVOLOG 100 UNIT/ML injection Generic drug:  insulin aspart Inject 0-30 Units into the skin 3 (three) times daily with meals. Sliding Scale:  >300 15 units < 300 do not use   oxcarbazepine 600 MG tablet Commonly known as:  TRILEPTAL Take 600 mg by mouth 2 (two) times daily.   oxyCODONE-acetaminophen 10-325 MG tablet Commonly known as:  PERCOCET Take 1 tablet by mouth every 4 (four) hours as needed for pain. What changed:  Another medication with the same name was added. Make sure you understand how and when to take each.   oxyCODONE-acetaminophen 5-325 MG tablet Commonly known as:  PERCOCET/ROXICET Take 1 tablet by mouth every 4 (four) hours as needed for severe pain. What changed:  You were already taking a medication with the same name, and this prescription was added. Make sure you understand how and when to take each.   pantoprazole 40 MG tablet Commonly known as:  PROTONIX Take 1 tablet (40 mg total) by mouth daily.   pentoxifylline 400 MG CR tablet Commonly known as:  TRENTAL Take 1 tablet (400 mg total) by mouth 3 (three) times daily with meals.   PROAIR HFA 108 (90 Base) MCG/ACT inhaler Generic drug:  albuterol Inhale 2 puffs into the lungs every 6 (six) hours as needed for shortness of breath.   simvastatin 40 MG tablet Commonly known as:  ZOCOR Take 40 mg by mouth every evening.   tamsulosin 0.4 MG Caps capsule Commonly known as:  FLOMAX Take 0.4 mg by mouth daily.   zolpidem 10 MG tablet Commonly known as:  AMBIEN Take 10 mg by mouth at bedtime as needed for sleep.       Diagnostic Studies: No results found.  Patient benefited maximally from their hospital stay and there were no complications.     Disposition: Discharge disposition: 01-Home or Self Care      Discharge Instructions    Call MD / Call 911   Complete by:  As directed    If you experience chest pain or  shortness of breath, CALL 911 and be transported to the hospital emergency room.  If you develope a fever above 101 F, pus (white drainage) or increased drainage or redness at the wound, or calf pain, call your surgeon's office.   Constipation Prevention   Complete by:  As directed    Drink plenty of fluids.  Prune juice may be helpful.  You may use a stool softener, such as Colace (over the counter) 100  mg twice a day.  Use MiraLax (over the counter) for constipation as needed.   Diet - low sodium heart healthy   Complete by:  As directed    Increase activity slowly as tolerated   Complete by:  As directed    Negative Pressure Wound Therapy - Incisional   Complete by:  As directed    Obtain Praveena plus wound VAC pump from the operating room.  Discontinue the hospital VAC and attached to the portable Praveena plus wound VAC.     Follow-up [place] Follow up.   Why:  Call Monday to schedule follow up appointment Contact information: [place] [phone]           Signed: Newt Minion 02/06/2018, 6:56 AM

## 2018-02-09 ENCOUNTER — Ambulatory Visit (INDEPENDENT_AMBULATORY_CARE_PROVIDER_SITE_OTHER): Payer: Medicaid Other | Admitting: Orthopedic Surgery

## 2018-02-09 ENCOUNTER — Encounter (INDEPENDENT_AMBULATORY_CARE_PROVIDER_SITE_OTHER): Payer: Self-pay | Admitting: Orthopedic Surgery

## 2018-02-09 VITALS — Ht 69.0 in | Wt 149.0 lb

## 2018-02-09 DIAGNOSIS — Z89512 Acquired absence of left leg below knee: Secondary | ICD-10-CM

## 2018-02-09 NOTE — Progress Notes (Signed)
Office Visit Note   Patient: Kathy Phelps           Date of Birth: 1981/02/12           MRN: [id number] Visit Date: 02/09/2018              Requested by: No referring provider defined for this encounter. PCP: Patient, No Pcp Per  Chief Complaint  Patient presents with  . Left Leg - Routine Post Op    02/01/18 left BKA      HPI: Patient is a 37 year old woman who presents 1 week status post left transtibial amputation.  The wound VAC is removed.  Assessment & Plan: Visit Diagnoses:  1. Acquired absence of left leg below knee (HCC)     Plan: Recommended following up in 1 week and continue wearing the for XL stump shrinker.  Proper application was discussed proper care of the stump shrinker was discussed and the importance of working on knee extension was discussed.  Patient states that she was not in come back.  I had a discussion with her family and recommended to see if they could have her follow-up with care to ensure she can get fit with a prosthesis in the able to ambulate well without complications.  Discussed that if she will not follow-up for care of the they need to contact us and we can contact the patient and discussed the importance of proper follow-up care and treatment.  Follow-Up Instructions: Return in about 1 week (around 02/16/2018).   Ortho Exam  Patient is alert, oriented, no adenopathy, well-dressed, normal affect, normal respiratory effort. Examination the wound VAC was removed the wound edges are well approximated there is no redness no cellulitis no odor no drainage no signs of infection.  Patient is starting to develop a knee contracture and the importance of working on this was discussed.  Patient states that she is starting to work on her knee extension exercises.  She lacks about 5 degrees to full extension this time.  Imaging: No results found. No images are attached to the encounter.  Labs: Lab Results  Component Value Date   HGBA1C 9.1 (H)  02/01/2018   HGBA1C 11.8 (H) 02/02/2017   ESRSEDRATE 63 (H) 12/13/2017   ESRSEDRATE 72 (H) 09/02/2017   ESRSEDRATE 34 (H) 02/02/2017   CRP 3.5 (H) 09/02/2017   REPTSTATUS 09/07/2017 FINAL 09/02/2017   GRAMSTAIN  06/21/2013    FEW WBC PRESENT, PREDOMINANTLY PMN RARE SQUAMOUS EPITHELIAL CELLS PRESENT FEW GRAM POSITIVE COCCI IN PAIRS IN CLUSTERS Performed at [facility]  09/02/2017    NO GROWTH 5 DAYS Performed at [facility], [place] [address] [place], [place] [address]    LABORGA ESCHERICHIA COLI (A) 05/23/2016    Lab Results  Component Value Date/Time   HGBA1C 9.1 (H) 02/01/2018 10:43 AM   HGBA1C 11.8 (H) 02/02/2017 06:15 AM    Body mass index is 22 kg/m.  Orders:  No orders of the defined types were placed in this encounter.  No orders of the defined types were placed in this encounter.    Procedures: No procedures performed  Clinical Data: No additional findings.  ROS:  All other systems negative, except as noted in the HPI. Review of Systems  Objective: Vital Signs: Ht 5\' 9"  (1.753 m)   Wt 149 lb (67.6 kg)   LMP 01/28/2018   BMI 22.00 kg/m   Specialty Comments:  No specialty comments available.  PMFS History:  Patient Active Problem List   Diagnosis Date Noted  . History of left below knee amputation ([place]) 02/01/2018  . Gangrene of left foot ([place])   . Dehiscence of amputation stump ([place]) 01/26/2018  . Amputated toe of left foot ([place]) 01/26/2018  . Subacute osteomyelitis, left ankle and foot ([place])   . Surgical wound, non healing 09/02/2017  . Bipolar 1 disorder ([place]) 09/02/2017  . Nausea vomiting and diarrhea 08/11/2017  . Great toe pain, left 08/11/2017  . GERD (gastroesophageal reflux disease) 08/10/2017  . Anxiety 08/10/2017  . Edema   . Cellulitis   . AKI (acute kidney injury) ([place]) 02/04/2017  . Cellulitis of left foot 02/02/2017  . Type II diabetes mellitus with renal manifestations, uncontrolled ([place]) 02/02/2017    . HLD (hyperlipidemia) 02/02/2017  . Tobacco abuse 02/02/2017  . Essential hypertension 02/02/2017  . Malnutrition of moderate degree 02/02/2017   Past Medical History:  Diagnosis Date  . Anxiety   . Asthma   . Bipolar affective ([place])   . Complete miscarriage   . Constipation   . Depression   . Diabetes mellitus    Type II  . GERD (gastroesophageal reflux disease)   . Headache    "migraraines"  . Hyperlipidemia   . Hypertension   . Osteomyelitis ([place])    left foot  . Pregnancy complication    HELP Syndrome  . Renal disorder   . Schizophrenia ([place])   . Stroke [facility])    " mild stroke", memory loss- approx 2017    Family History  Problem Relation Age of Onset  . Diabetes Mellitus II Mother   . Heart disease Mother   . Heart disease Father     Past Surgical History:  Procedure Laterality Date  . ABDOMINAL AORTOGRAM N/A 09/07/2017   Procedure: ABDOMINAL AORTOGRAM;  Surgeon: Waynetta Sandy, MD;  Location: [place] [facility];  Service: Cardiovascular;  Laterality: N/A;  . ABDOMINAL AORTOGRAM W/LOWER EXTREMITY N/A 02/03/2017   Procedure: Abdominal Aortogram w/Lower Extremity;  Surgeon: Waynetta Sandy, MD;  Location: [place] [facility];  Service: Cardiovascular;  Laterality: N/A;  . AMPUTATION Left 02/10/2017   Procedure: AMPUTATION TOES 3, 4 AND 5  LEFT FOOT;  Surgeon: Waynetta Sandy, MD;  Location: [place];  Service: Vascular;  Laterality: Left;  . AMPUTATION Left 12/21/2017   Procedure: LEFT FOOT 5TH RAY AMPUTATION;  Surgeon: Newt Minion, MD;  Location: [place];  Service: Orthopedics;  Laterality: Left;  . AMPUTATION Left 02/01/2018   Procedure: LEFT BELOW KNEE AMPUTATION;  Surgeon: Newt Minion, MD;  Location: [place];  Service: Orthopedics;  Laterality: Left;  . CESAREAN SECTION    . CHOLECYSTECTOMY    . LOWER EXTREMITY ANGIOGRAPHY Bilateral 09/07/2017   Procedure: Lower Extremity Angiography;  Surgeon: Waynetta Sandy, MD;  Location:  [place] [facility];  Service: Cardiovascular;  Laterality: Bilateral;  . PERIPHERAL VASCULAR ATHERECTOMY  02/03/2017   Procedure: Peripheral Vascular Atherectomy;  Surgeon: Waynetta Sandy, MD;  Location: [place] [facility];  Service: Cardiovascular;;  . PERIPHERAL VASCULAR BALLOON ANGIOPLASTY  02/03/2017   Procedure: Peripheral Vascular Balloon Angioplasty;  Surgeon: Waynetta Sandy, MD;  Location: [place] [facility];  Service: Cardiovascular;;  . PERIPHERAL VASCULAR INTERVENTION Left 09/07/2017   Procedure: PERIPHERAL VASCULAR INTERVENTION;  Surgeon: Waynetta Sandy, MD;  Location: [place] [facility];  Service: Cardiovascular;  Laterality: Left;  SFA/POPLITEAL  . TUBAL LIGATION     Social History   Occupational History  .  Not on file  Tobacco Use  . Smoking status: Current Every Day Smoker    Packs/day: 1.00    Years: 28.00    Pack years: 28.00    Types: Cigarettes  . Smokeless tobacco: Never Used  Substance and Sexual Activity  . Alcohol use: No  . Drug use: No  . Sexual activity: Yes    Birth control/protection: None

## 2018-02-10 ENCOUNTER — Encounter (HOSPITAL_COMMUNITY): Payer: Medicaid Other

## 2018-02-10 ENCOUNTER — Ambulatory Visit: Payer: Medicaid Other | Admitting: Vascular Surgery

## 2018-02-16 ENCOUNTER — Ambulatory Visit (INDEPENDENT_AMBULATORY_CARE_PROVIDER_SITE_OTHER): Payer: Medicaid Other | Admitting: Orthopedic Surgery

## 2018-02-20 ENCOUNTER — Ambulatory Visit (INDEPENDENT_AMBULATORY_CARE_PROVIDER_SITE_OTHER): Payer: Medicaid Other | Admitting: Orthopedic Surgery

## 2018-02-28 ENCOUNTER — Telehealth (INDEPENDENT_AMBULATORY_CARE_PROVIDER_SITE_OTHER): Payer: Self-pay | Admitting: Orthopedic Surgery

## 2018-02-28 NOTE — Telephone Encounter (Signed)
Called and sw Kathy Phelps with [facility] advised ok for [facility] to remove staples and apply shrinker or dry dressing if she does not have this. She has an appt to come in on 03/09/18 advise to have pt cal and move up appt if needed. She does have transportation issues but when ever she can come we are happy to see her if needed before her sch appt.

## 2018-02-28 NOTE — Telephone Encounter (Signed)
Lauren with [facility] called to advise that patients staples need to come out according to her PT, she said the area is starting to look bad and she knows patient has trouble with getting a ride so she is requesting a nurse eval and staple removal. She also said we might could encourage patient to get in here sooner?  Laurens # [phone] ext [phone] *She said detailed message could be left on vm if she does not answer.

## 2018-03-02 ENCOUNTER — Telehealth (INDEPENDENT_AMBULATORY_CARE_PROVIDER_SITE_OTHER): Payer: Self-pay | Admitting: Radiology

## 2018-03-02 NOTE — Telephone Encounter (Signed)
Glenard Haring is calling to get home health nursing, she lmom, states that she needs orders for 3 weeks, but she also states that she wants orders for 1 week of 3 visits. (not sure which she is needing). Also needs order for Medical Social Worker eval.  Please call her back to advise.

## 2018-03-02 NOTE — Telephone Encounter (Signed)
I called to give verbal ok for [facility] and social work and Glenard Haring states that she will have to call me back. She is not sure if they are going to proceed with care for this pt there is a question of staff safety in the home and she reports psych issues and concerns with the pt and she is waiting to talk with her manager to see how they are going to proceed and will call me back and let me know. I will hold message pending decision.

## 2018-03-09 ENCOUNTER — Ambulatory Visit (INDEPENDENT_AMBULATORY_CARE_PROVIDER_SITE_OTHER): Payer: Medicaid Other | Admitting: Orthopedic Surgery

## 2018-03-09 ENCOUNTER — Encounter (INDEPENDENT_AMBULATORY_CARE_PROVIDER_SITE_OTHER): Payer: Self-pay | Admitting: Orthopedic Surgery

## 2018-03-09 VITALS — Ht 69.0 in | Wt 149.0 lb

## 2018-03-09 DIAGNOSIS — Z89512 Acquired absence of left leg below knee: Secondary | ICD-10-CM

## 2018-03-09 MED ORDER — OXYCODONE-ACETAMINOPHEN 5-325 MG PO TABS: 1.0000 | ORAL_TABLET | Freq: Three times a day (TID) | ORAL | 0 refills | Status: DC | PRN

## 2018-03-09 NOTE — Telephone Encounter (Signed)
Pt in office today and she is receiving HHPT and does not have HHN

## 2018-03-09 NOTE — Progress Notes (Signed)
Office Visit Note   Patient: Kathy Phelps           Date of Birth: 05/15/1981           MRN: [id number] Visit Date: 03/09/2018              Requested by: No referring provider defined for this encounter. PCP: Patient, No Pcp Per  Chief Complaint  Patient presents with  . Right Leg - Routine Post Op    02/01/18 left BKA   . Right Foot - Open Wound    5th MTH callus/ulcer      HPI: Patient is a 37 year old woman who presents in follow-up status post left transtibial amputation.  She is about a month out from surgery.  Patient states that if she fell on her leg.  She has not been wearing the stump protector.  Patient is currently eating M&Ms.  Assessment & Plan: Visit Diagnoses:  1. Acquired absence of left leg below knee (HCC)     Plan: Recommended using the stump shrinker around-the-clock she is given a prescription for biotech to get a smaller stump shrinker recommended using the stump protector.  Discussed proper diet.  Follow-Up Instructions: Return in about 2 weeks (around 03/23/2018).   Ortho Exam  Patient is alert, oriented, no adenopathy, well-dressed, normal affect, normal respiratory effort. Examination there is slight dehiscence of the lateral wound.  There is no ecchymosis or bruising in the residual limb there is no exposed bone or tendon.  There is slight lateral dehiscence of the wound.  Imaging: No results found. No images are attached to the encounter.  Labs: Lab Results  Component Value Date   HGBA1C 9.1 (H) 02/01/2018   HGBA1C 11.8 (H) 02/02/2017   ESRSEDRATE 63 (H) 12/13/2017   ESRSEDRATE 72 (H) 09/02/2017   ESRSEDRATE 34 (H) 02/02/2017   CRP 3.5 (H) 09/02/2017   REPTSTATUS 09/07/2017 FINAL 09/02/2017   GRAMSTAIN  06/21/2013    FEW WBC PRESENT, PREDOMINANTLY PMN RARE SQUAMOUS EPITHELIAL CELLS PRESENT FEW GRAM POSITIVE COCCI IN PAIRS IN CLUSTERS Performed at [facility]  09/02/2017    NO GROWTH 5 DAYS Performed at [facility], [place] [address], [place], [place] [address]    LABORGA ESCHERICHIA COLI (A) 05/23/2016     Lab Results  Component Value Date   ALBUMIN 2.1 (L) 09/07/2017   ALBUMIN 2.2 (L) 09/06/2017   ALBUMIN 2.1 (L) 09/05/2017   PREALBUMIN 15.3 (L) 09/02/2017    Body mass index is 22 kg/m.  Orders:  No orders of the defined types were placed in this encounter.  No orders of the defined types were placed in this encounter.    Procedures: No procedures performed  Clinical Data: No additional findings.  ROS:  All other systems negative, except as noted in the HPI. Review of Systems  Objective: Vital Signs: Ht 5\' 9"  (1.753 m)   Wt 149 lb (67.6 kg)   BMI 22.00 kg/m   Specialty Comments:  No specialty comments available.  PMFS History: Patient Active Problem List   Diagnosis Date Noted  . History of left below knee amputation ([place]) 02/01/2018  . Gangrene of left foot ([place])   . Dehiscence of amputation stump ([place]) 01/26/2018  . Amputated toe of left foot ([place]) 01/26/2018  . Subacute osteomyelitis, left ankle and foot ([place])   . Surgical wound, non healing 09/02/2017  . Bipolar 1 disorder ([place]) 09/02/2017  . Nausea vomiting and diarrhea 08/11/2017  .  Great toe pain, left 08/11/2017  . GERD (gastroesophageal reflux disease) 08/10/2017  . Anxiety 08/10/2017  . Edema   . Cellulitis   . AKI (acute kidney injury) ([place]) 02/04/2017  . Cellulitis of left foot 02/02/2017  . Type II diabetes mellitus with renal manifestations, uncontrolled ([place]) 02/02/2017  . HLD (hyperlipidemia) 02/02/2017  . Tobacco abuse 02/02/2017  . Essential hypertension 02/02/2017  . Malnutrition of moderate degree 02/02/2017   Past Medical History:  Diagnosis Date  . Anxiety   . Asthma   . Bipolar affective ([place])   . Complete miscarriage   . Constipation   . Depression   . Diabetes mellitus    Type II  . GERD (gastroesophageal reflux disease)   . Headache    "migraraines"  .  Hyperlipidemia   . Hypertension   . Osteomyelitis ([place])    left foot  . Pregnancy complication    HELP Syndrome  . Renal disorder   . Schizophrenia ([place])   . Stroke [facility])    " mild stroke", memory loss- approx 2017    Family History  Problem Relation Age of Onset  . Diabetes Mellitus II Mother   . Heart disease Mother   . Heart disease Father     Past Surgical History:  Procedure Laterality Date  . ABDOMINAL AORTOGRAM N/A 09/07/2017   Procedure: ABDOMINAL AORTOGRAM;  Surgeon: Waynetta Sandy, MD;  Location: [facility];  Service: Cardiovascular;  Laterality: N/A;  . ABDOMINAL AORTOGRAM W/LOWER EXTREMITY N/A 02/03/2017   Procedure: Abdominal Aortogram w/Lower Extremity;  Surgeon: Waynetta Sandy, MD;  Location: [facility];  Service: Cardiovascular;  Laterality: N/A;  . AMPUTATION Left 02/10/2017   Procedure: AMPUTATION TOES 3, 4 AND 5  LEFT FOOT;  Surgeon: Waynetta Sandy, MD;  Location: [facility];  Service: Vascular;  Laterality: Left;  . AMPUTATION Left 12/21/2017   Procedure: LEFT FOOT 5TH RAY AMPUTATION;  Surgeon: Newt Minion, MD;  Location: [facility];  Service: Orthopedics;  Laterality: Left;  . AMPUTATION Left 02/01/2018   Procedure: LEFT BELOW KNEE AMPUTATION;  Surgeon: Newt Minion, MD;  Location: [facility];  Service: Orthopedics;  Laterality: Left;  . CESAREAN SECTION    . CHOLECYSTECTOMY    . LOWER EXTREMITY ANGIOGRAPHY Bilateral 09/07/2017   Procedure: Lower Extremity Angiography;  Surgeon: Waynetta Sandy, MD;  Location: [facility];  Service: Cardiovascular;  Laterality: Bilateral;  . PERIPHERAL VASCULAR ATHERECTOMY  02/03/2017   Procedure: Peripheral Vascular Atherectomy;  Surgeon: Waynetta Sandy, MD;  Location: [facility];  Service: Cardiovascular;;  . PERIPHERAL VASCULAR BALLOON ANGIOPLASTY  02/03/2017   Procedure: Peripheral Vascular Balloon Angioplasty;  Surgeon: Waynetta Sandy, MD;   Location: [facility];  Service: Cardiovascular;;  . PERIPHERAL VASCULAR INTERVENTION Left 09/07/2017   Procedure: PERIPHERAL VASCULAR INTERVENTION;  Surgeon: Waynetta Sandy, MD;  Location: [facility];  Service: Cardiovascular;  Laterality: Left;  SFA/POPLITEAL  . TUBAL LIGATION     Social History   Occupational History  . Not on file  Tobacco Use  . Smoking status: Current Every Day Smoker    Packs/day: 1.00    Years: 28.00    Pack years: 28.00    Types: Cigarettes  . Smokeless tobacco: Never Used  Substance and Sexual Activity  . Alcohol use: No  . Drug use: No  . Sexual activity: Yes    Birth control/protection: None

## 2018-03-16 ENCOUNTER — Telehealth (INDEPENDENT_AMBULATORY_CARE_PROVIDER_SITE_OTHER): Payer: Self-pay | Admitting: Orthopedic Surgery

## 2018-03-16 NOTE — Telephone Encounter (Signed)
Records 11/11/2017 - present faxed to [facility]

## 2018-03-22 ENCOUNTER — Ambulatory Visit (INDEPENDENT_AMBULATORY_CARE_PROVIDER_SITE_OTHER): Payer: Medicaid Other | Admitting: Family

## 2018-04-14 ENCOUNTER — Telehealth (INDEPENDENT_AMBULATORY_CARE_PROVIDER_SITE_OTHER): Payer: Self-pay

## 2018-04-14 NOTE — Telephone Encounter (Signed)
Patient called to be seen today because her left BKA has opened up and is draining and painful.  She was advised we have no doctors in clinic this afternoon, and after talking with Dr. Sharol Given, told her to go to the ED at [facility] for them to evaluate. If admitted by the hospitalists, Dr. Sharol Given will see her in the hospital.  She chose to not do this and schedule an appt for Monday with Dr. Sharol Given instead (9:45).  Advised her to go to the ED if worsens before Monday.

## 2018-04-17 ENCOUNTER — Ambulatory Visit (INDEPENDENT_AMBULATORY_CARE_PROVIDER_SITE_OTHER): Payer: Self-pay | Admitting: Orthopedic Surgery

## 2018-04-20 ENCOUNTER — Ambulatory Visit (INDEPENDENT_AMBULATORY_CARE_PROVIDER_SITE_OTHER): Payer: Medicaid Other | Admitting: Family

## 2018-04-20 ENCOUNTER — Encounter (INDEPENDENT_AMBULATORY_CARE_PROVIDER_SITE_OTHER): Payer: Self-pay | Admitting: Family

## 2018-04-20 DIAGNOSIS — Z89512 Acquired absence of left leg below knee: Secondary | ICD-10-CM

## 2018-04-20 DIAGNOSIS — T8781 Dehiscence of amputation stump: Secondary | ICD-10-CM

## 2018-04-20 MED ORDER — SILVER SULFADIAZINE 1 % EX CREA: 1.0000 "application " | TOPICAL_CREAM | Freq: Every day | CUTANEOUS | 0 refills | Status: DC

## 2018-04-20 MED ORDER — OXYCODONE-ACETAMINOPHEN 5-325 MG PO TABS: 1.0000 | ORAL_TABLET | Freq: Three times a day (TID) | ORAL | 0 refills | Status: AC | PRN

## 2018-04-20 NOTE — Progress Notes (Signed)
Office Visit Note   Patient: Kathy Phelps           Date of Birth: 12-25-1980           MRN: [id number] Visit Date: 04/20/2018              Requested by: No referring provider defined for this encounter. PCP: Patient, No Pcp Per  Chief Complaint  Patient presents with  . Left Knee - Routine Post Op      HPI: Patient is a 37 year old woman who presents in follow-up status post left transtibial amputation on 02/01/18.  Patient states that if she fell on her leg again.  She has not been wearing the stump protector.   Assessment & Plan: Visit Diagnoses:  1. Dehiscence of amputation stump ([place])   2. History of left below knee amputation ([place])     Plan: patient declined radiographs.   Recommended using the stump shrinker around-the-clock. recommended using the stump protector.  Discussed proper diet. Given order for silvadene. Pack wound open with silvadene and gauze.  Follow-Up Instructions: No follow-ups on file.   Ortho Exam  Patient is alert, oriented, no adenopathy, well-dressed, normal affect, normal respiratory effort. Examination there is dehiscence of the lateral wound, a length of 5 cm, open a cm, is 5 mm deep. Does not probe to bone or tendon. There is no exposed bone or tendon.  No erythema, drainage or odor.  Imaging: No results found. No images are attached to the encounter.  Labs: Lab Results  Component Value Date   HGBA1C 9.1 (H) 02/01/2018   HGBA1C 11.8 (H) 02/02/2017   ESRSEDRATE 63 (H) 12/13/2017   ESRSEDRATE 72 (H) 09/02/2017   ESRSEDRATE 34 (H) 02/02/2017   CRP 3.5 (H) 09/02/2017   REPTSTATUS 09/07/2017 FINAL 09/02/2017   GRAMSTAIN  06/21/2013    FEW WBC PRESENT, PREDOMINANTLY PMN RARE SQUAMOUS EPITHELIAL CELLS PRESENT FEW GRAM POSITIVE COCCI IN PAIRS IN CLUSTERS Performed at [facility]  09/02/2017    NO GROWTH 5 DAYS Performed at [facility], [place] [address] [place], [place] [address]    LABORGA ESCHERICHIA COLI  (A) 05/23/2016     Lab Results  Component Value Date   ALBUMIN 2.1 (L) 09/07/2017   ALBUMIN 2.2 (L) 09/06/2017   ALBUMIN 2.1 (L) 09/05/2017   PREALBUMIN 15.3 (L) 09/02/2017    There is no height or weight on file to calculate BMI.  Orders:  No orders of the defined types were placed in this encounter.  Meds ordered this encounter  Medications  . oxyCODONE-acetaminophen (PERCOCET/ROXICET) 5-325 MG tablet    Sig: Take 1 tablet by mouth every 8 (eight) hours as needed for up to 7 days.    Dispense:  20 tablet    Refill:  0  . silver sulfADIAZINE (SILVADENE) 1 % cream    Sig: Apply 1 application topically daily.    Dispense:  50 g    Refill:  0     Procedures: No procedures performed  Clinical Data: No additional findings.  ROS:  All other systems negative, except as noted in the HPI. Review of Systems  Constitutional: Negative for chills and fever.  Musculoskeletal: Positive for myalgias.  Skin: Positive for wound. Negative for color change.    Objective: Vital Signs: There were no vitals taken for this visit.  Specialty Comments:  No specialty comments available.  PMFS History: Patient Active Problem List   Diagnosis Date Noted  .  History of left below knee amputation ([place]) 02/01/2018  . Gangrene of left foot ([place])   . Dehiscence of amputation stump ([place]) 01/26/2018  . Surgical wound, non healing 09/02/2017  . Bipolar 1 disorder ([place]) 09/02/2017  . Nausea vomiting and diarrhea 08/11/2017  . Great toe pain, left 08/11/2017  . GERD (gastroesophageal reflux disease) 08/10/2017  . Anxiety 08/10/2017  . Edema   . Cellulitis   . AKI (acute kidney injury) ([place]) 02/04/2017  . Type II diabetes mellitus with renal manifestations, uncontrolled ([place]) 02/02/2017  . HLD (hyperlipidemia) 02/02/2017  . Tobacco abuse 02/02/2017  . Essential hypertension 02/02/2017  . Malnutrition of moderate degree 02/02/2017   Past Medical History:  Diagnosis Date  . Anxiety     . Asthma   . Bipolar affective ([place])   . Complete miscarriage   . Constipation   . Depression   . Diabetes mellitus    Type II  . GERD (gastroesophageal reflux disease)   . Headache    "migraraines"  . Hyperlipidemia   . Hypertension   . Osteomyelitis ([place])    left foot  . Pregnancy complication    HELP Syndrome  . Renal disorder   . Schizophrenia ([place])   . Stroke [facility])    " mild stroke", memory loss- approx 2017    Family History  Problem Relation Age of Onset  . Diabetes Mellitus II Mother   . Heart disease Mother   . Heart disease Father     Past Surgical History:  Procedure Laterality Date  . ABDOMINAL AORTOGRAM N/A 09/07/2017   Procedure: ABDOMINAL AORTOGRAM;  Surgeon: Waynetta Sandy, MD;  Location: [facility];  Service: Cardiovascular;  Laterality: N/A;  . ABDOMINAL AORTOGRAM W/LOWER EXTREMITY N/A 02/03/2017   Procedure: Abdominal Aortogram w/Lower Extremity;  Surgeon: Waynetta Sandy, MD;  Location: [facility];  Service: Cardiovascular;  Laterality: N/A;  . AMPUTATION Left 02/10/2017   Procedure: AMPUTATION TOES 3, 4 AND 5  LEFT FOOT;  Surgeon: Waynetta Sandy, MD;  Location: [facility];  Service: Vascular;  Laterality: Left;  . AMPUTATION Left 12/21/2017   Procedure: LEFT FOOT 5TH RAY AMPUTATION;  Surgeon: Newt Minion, MD;  Location: [facility];  Service: Orthopedics;  Laterality: Left;  . AMPUTATION Left 02/01/2018   Procedure: LEFT BELOW KNEE AMPUTATION;  Surgeon: Newt Minion, MD;  Location: [facility];  Service: Orthopedics;  Laterality: Left;  . CESAREAN SECTION    . CHOLECYSTECTOMY    . LOWER EXTREMITY ANGIOGRAPHY Bilateral 09/07/2017   Procedure: Lower Extremity Angiography;  Surgeon: Waynetta Sandy, MD;  Location: [facility];  Service: Cardiovascular;  Laterality: Bilateral;  . PERIPHERAL VASCULAR ATHERECTOMY  02/03/2017   Procedure: Peripheral Vascular Atherectomy;  Surgeon: Waynetta Sandy, MD;   Location: [facility] [facility];  Service: Cardiovascular;;  . PERIPHERAL VASCULAR BALLOON ANGIOPLASTY  02/03/2017   Procedure: Peripheral Vascular Balloon Angioplasty;  Surgeon: Waynetta Sandy, MD;  Location: [facility];  Service: Cardiovascular;;  . PERIPHERAL VASCULAR INTERVENTION Left 09/07/2017   Procedure: PERIPHERAL VASCULAR INTERVENTION;  Surgeon: Waynetta Sandy, MD;  Location: [facility];  Service: Cardiovascular;  Laterality: Left;  SFA/POPLITEAL  . TUBAL LIGATION     Social History   Occupational History  . Not on file  Tobacco Use  . Smoking status: Current Every Day Smoker    Packs/day: 1.00    Years: 28.00    Pack years: 28.00    Types: Cigarettes  .  Smokeless tobacco: Never Used  Substance and Sexual Activity  . Alcohol use: No  . Drug use: No  . Sexual activity: Yes    Birth control/protection: None

## 2018-04-25 ENCOUNTER — Emergency Department: Payer: Medicaid Other

## 2018-04-25 ENCOUNTER — Encounter: Payer: Self-pay | Admitting: Emergency Medicine

## 2018-04-25 ENCOUNTER — Emergency Department
Admission: EM | Admit: 2018-04-25 | Discharge: 2018-04-26 | Disposition: A | Discharge status: Home / Self Care | Payer: Medicaid Other | Attending: Emergency Medicine | Admitting: Emergency Medicine

## 2018-04-25 ENCOUNTER — Other Ambulatory Visit: Payer: Self-pay

## 2018-04-25 DIAGNOSIS — W19XXXA Unspecified fall, initial encounter: Secondary | ICD-10-CM | POA: Diagnosis not present

## 2018-04-25 DIAGNOSIS — Y939 Activity, unspecified: Secondary | ICD-10-CM | POA: Diagnosis not present

## 2018-04-25 DIAGNOSIS — Y628 Failure of sterile precautions during other surgical and medical care: Secondary | ICD-10-CM | POA: Insufficient documentation

## 2018-04-25 DIAGNOSIS — Y929 Unspecified place or not applicable: Secondary | ICD-10-CM | POA: Diagnosis not present

## 2018-04-25 DIAGNOSIS — Z7902 Long term (current) use of antithrombotics/antiplatelets: Secondary | ICD-10-CM | POA: Diagnosis not present

## 2018-04-25 DIAGNOSIS — I10 Essential (primary) hypertension: Secondary | ICD-10-CM | POA: Insufficient documentation

## 2018-04-25 DIAGNOSIS — F17210 Nicotine dependence, cigarettes, uncomplicated: Secondary | ICD-10-CM | POA: Diagnosis not present

## 2018-04-25 DIAGNOSIS — Z794 Long term (current) use of insulin: Secondary | ICD-10-CM | POA: Insufficient documentation

## 2018-04-25 DIAGNOSIS — Y998 Other external cause status: Secondary | ICD-10-CM | POA: Insufficient documentation

## 2018-04-25 DIAGNOSIS — T8130XA Disruption of wound, unspecified, initial encounter: Secondary | ICD-10-CM | POA: Insufficient documentation

## 2018-04-25 DIAGNOSIS — Z89512 Acquired absence of left leg below knee: Secondary | ICD-10-CM | POA: Diagnosis not present

## 2018-04-25 DIAGNOSIS — R509 Fever, unspecified: Secondary | ICD-10-CM | POA: Insufficient documentation

## 2018-04-25 DIAGNOSIS — E1169 Type 2 diabetes mellitus with other specified complication: Secondary | ICD-10-CM | POA: Diagnosis not present

## 2018-04-25 DIAGNOSIS — S8992XA Unspecified injury of left lower leg, initial encounter: Secondary | ICD-10-CM | POA: Diagnosis present

## 2018-04-25 LAB — BASIC METABOLIC PANEL
ANION GAP: 6 (ref 5–15)
BUN: 25 mg/dL — AB (ref 6–20)
CO2: 21 mmol/L — AB (ref 22–32)
Calcium: 8.6 mg/dL — ABNORMAL LOW (ref 8.9–10.3)
Chloride: 107 mmol/L (ref 98–111)
Creatinine, Ser: 1.93 mg/dL — ABNORMAL HIGH (ref 0.44–1.00)
GFR calc Af Amer: 37 mL/min — ABNORMAL LOW (ref >60)
GFR calc non Af Amer: 32 mL/min — ABNORMAL LOW (ref >60)
GLUCOSE: 247 mg/dL — AB (ref 70–99)
POTASSIUM: 4.6 mmol/L (ref 3.5–5.1)
Sodium: 134 mmol/L — ABNORMAL LOW (ref 135–145)

## 2018-04-25 LAB — URINALYSIS, COMPLETE (UACMP) WITH MICROSCOPIC
BILIRUBIN URINE: NEGATIVE
Glucose, UA: >=500 mg/dL — AB
Ketones, ur: NEGATIVE mg/dL
LEUKOCYTES UA: NEGATIVE
Nitrite: NEGATIVE
PH: 5 (ref 5.0–8.0)
Protein, ur: 100 mg/dL — AB
SPECIFIC GRAVITY, URINE: 1.021 (ref 1.005–1.030)

## 2018-04-25 LAB — CBC WITH DIFFERENTIAL/PLATELET
BASOS ABS: 0 10*3/uL (ref 0–0.1)
Basophils Relative: 0 %
EOS PCT: 4 %
Eosinophils Absolute: 0.5 10*3/uL (ref 0–0.7)
HEMATOCRIT: 31.7 % — AB (ref 35.0–47.0)
Hemoglobin: 10.9 g/dL — ABNORMAL LOW (ref 12.0–16.0)
LYMPHS ABS: 2.5 10*3/uL (ref 1.0–3.6)
LYMPHS PCT: 22 %
MCH: 31.1 pg (ref 26.0–34.0)
MCHC: 34.3 g/dL (ref 32.0–36.0)
MCV: 90.7 fL (ref 80.0–100.0)
Monocytes Absolute: 0.7 10*3/uL (ref 0.2–0.9)
Monocytes Relative: 6 %
Neutro Abs: 7.5 10*3/uL — ABNORMAL HIGH (ref 1.4–6.5)
Neutrophils Relative %: 68 %
PLATELETS: 295 10*3/uL (ref 150–440)
RBC: 3.49 MIL/uL — AB (ref 3.80–5.20)
RDW: 13.9 % (ref 11.5–14.5)
WBC: 11.1 10*3/uL — AB (ref 3.6–11.0)

## 2018-04-25 LAB — GLUCOSE, CAPILLARY: GLUCOSE-CAPILLARY: 270 mg/dL — AB (ref 70–99)

## 2018-04-25 LAB — LACTIC ACID, PLASMA: LACTIC ACID, VENOUS: 0.9 mmol/L (ref 0.5–1.9)

## 2018-04-25 LAB — PREGNANCY, URINE: Preg Test, Ur: NEGATIVE

## 2018-04-25 MED ORDER — IBUPROFEN 600 MG PO TABS: 600.0000 mg | ORAL_TABLET | Freq: Once | ORAL | Status: DC

## 2018-04-25 MED ORDER — VANCOMYCIN HCL IN DEXTROSE 1-5 GM/200ML-% IV SOLN: 1000.0000 mg | Freq: Once | INTRAVENOUS | Status: DC

## 2018-04-25 MED ORDER — VANCOMYCIN HCL IN DEXTROSE 750-5 MG/150ML-% IV SOLN
750.0000 mg | Freq: Once | INTRAVENOUS | Status: AC
  Administered 2018-04-25: 750 mg via INTRAVENOUS

## 2018-04-25 MED ORDER — KETOROLAC TROMETHAMINE 30 MG/ML IJ SOLN
30.0000 mg | Freq: Once | INTRAMUSCULAR | Status: AC
  Administered 2018-04-25: 30 mg via INTRAVENOUS
  Filled 2018-04-25: qty 1

## 2018-04-25 MED ORDER — SULFAMETHOXAZOLE-TRIMETHOPRIM 800-160 MG PO TABS: 1.0000 | ORAL_TABLET | Freq: Once | ORAL | Status: DC

## 2018-04-25 MED ORDER — VANCOMYCIN HCL IN DEXTROSE 1-5 GM/200ML-% IV SOLN
1000.0000 mg | Freq: Once | INTRAVENOUS | Status: DC
  Filled 2018-04-25: qty 200

## 2018-04-25 MED ORDER — SULFAMETHOXAZOLE-TRIMETHOPRIM 800-160 MG PO TABS: 1.0000 | ORAL_TABLET | Freq: Two times a day (BID) | ORAL | 0 refills | Status: DC

## 2018-04-25 NOTE — ED Provider Notes (Signed)
[facility] Emergency Department Provider Note  ____________________________________________   I have reviewed the triage vital signs and the nursing notes.   HISTORY  Chief Complaint Left leg pain  History limited by: Not Limited   HPI Kathy Phelps is a 37 y.o. female who presents to the emergency department today because of concern for pain and possible infection to the site of her left below the knee amputation.  Patient states she had amputation performed a number of months ago.  Since then she has had some issues with dehiscence but had a recent fall.  Made the dehiscence worse.  For the past few days she has noticed increased pain.  She also has noticed a bad odor and discharge from the wound site.  She has had subjective fevers.    Per medical record review patient has a history of left below the knee amputation  Past Medical History:  Diagnosis Date  . Anxiety   . Asthma   . Bipolar affective ([place])   . Complete miscarriage   . Constipation   . Depression   . Diabetes mellitus    Type II  . GERD (gastroesophageal reflux disease)   . Headache    "migraraines"  . Hyperlipidemia   . Hypertension   . Osteomyelitis ([place])    left foot  . Pregnancy complication    HELP Syndrome  . Renal disorder   . Schizophrenia ([place])   . Stroke [facility])    " mild stroke", memory loss- approx 2017    Patient Active Problem List   Diagnosis Date Noted  . History of left below knee amputation ([place]) 02/01/2018  . Gangrene of left foot ([place])   . Dehiscence of amputation stump ([place]) 01/26/2018  . Surgical wound, non healing 09/02/2017  . Bipolar 1 disorder ([place]) 09/02/2017  . Nausea vomiting and diarrhea 08/11/2017  . Great toe pain, left 08/11/2017  . GERD (gastroesophageal reflux disease) 08/10/2017  . Anxiety 08/10/2017  . Edema   . Cellulitis   . AKI (acute kidney injury) ([place]) 02/04/2017  . Type II diabetes mellitus with renal manifestations,  uncontrolled ([place]) 02/02/2017  . HLD (hyperlipidemia) 02/02/2017  . Tobacco abuse 02/02/2017  . Essential hypertension 02/02/2017  . Malnutrition of moderate degree 02/02/2017    Past Surgical History:  Procedure Laterality Date  . ABDOMINAL AORTOGRAM N/A 09/07/2017   Procedure: ABDOMINAL AORTOGRAM;  Surgeon: Waynetta Sandy, MD;  Location: [place] [facility];  Service: Cardiovascular;  Laterality: N/A;  . ABDOMINAL AORTOGRAM W/LOWER EXTREMITY N/A 02/03/2017   Procedure: Abdominal Aortogram w/Lower Extremity;  Surgeon: Waynetta Sandy, MD;  Location: [place] [facility];  Service: Cardiovascular;  Laterality: N/A;  . AMPUTATION Left 02/10/2017   Procedure: AMPUTATION TOES 3, 4 AND 5  LEFT FOOT;  Surgeon: Waynetta Sandy, MD;  Location: [name];  Service: Vascular;  Laterality: Left;  . AMPUTATION Left 12/21/2017   Procedure: LEFT FOOT 5TH RAY AMPUTATION;  Surgeon: Newt Minion, MD;  Location: [place];  Service: Orthopedics;  Laterality: Left;  . AMPUTATION Left 02/01/2018   Procedure: LEFT BELOW KNEE AMPUTATION;  Surgeon: Newt Minion, MD;  Location: [place];  Service: Orthopedics;  Laterality: Left;  . CESAREAN SECTION    . CHOLECYSTECTOMY    . LOWER EXTREMITY ANGIOGRAPHY Bilateral 09/07/2017   Procedure: Lower Extremity Angiography;  Surgeon: Waynetta Sandy, MD;  Location: [place] [facility];  Service: Cardiovascular;  Laterality: Bilateral;  . PERIPHERAL VASCULAR ATHERECTOMY  02/03/2017  Procedure: Peripheral Vascular Atherectomy;  Surgeon: Waynetta Sandy, MD;  Location: [place] [facility];  Service: Cardiovascular;;  . PERIPHERAL VASCULAR BALLOON ANGIOPLASTY  02/03/2017   Procedure: Peripheral Vascular Balloon Angioplasty;  Surgeon: Waynetta Sandy, MD;  Location: [place] [facility];  Service: Cardiovascular;;  . PERIPHERAL VASCULAR INTERVENTION Left 09/07/2017   Procedure: PERIPHERAL VASCULAR INTERVENTION;  Surgeon: Waynetta Sandy, MD;  Location: [place] [facility];  Service: Cardiovascular;  Laterality: Left;  SFA/POPLITEAL  . TUBAL LIGATION      Prior to Admission medications   Medication Sig Start Date End Date Taking? Authorizing Provider  ADVAIR DISKUS 100-50 MCG/DOSE AEPB Inhale 1 puff into the lungs daily. 01/10/15   [provider]  alprazolam Duanne Moron) 2 MG tablet Take 1 tablet (2 mg total) by mouth 4 (four) times daily as needed for sleep. Patient taking differently: Take 1 mg by mouth 4 (four) times daily.  08/12/17   Debbe Odea, MD  clopidogrel (PLAVIX) 75 MG tablet Take 1 tablet (75 mg total) by mouth daily with breakfast. 09/08/17   Rhyne, Hulen Shouts, PA-C  doxycycline (VIBRAMYCIN) 100 MG capsule Take 1 capsule (100 mg total) by mouth 2 (two) times daily. 01/16/18   Newt Minion, MD  famotidine (PEPCID) 20 MG tablet Take 1 tablet (20 mg total) by mouth 2 (two) times daily. 08/12/17   Debbe Odea, MD  gabapentin (NEURONTIN) 800 MG tablet Take 1 tablet (800 mg total) by mouth 2 (two) times daily. Patient taking differently: Take 800 mg by mouth 5 (five) times daily.  02/11/17   Hosie Poisson, MD  insulin glargine (LANTUS) 100 UNIT/ML injection Inject 0.2-0.3 mLs (20-30 Units total) into the skin 2 (two) times daily. 20 U in the morning and 30 U in the evening Patient taking differently: Inject 20-30 Units into the skin See admin instructions. 20 U in the morning and 30 U in the evening 08/12/17   Caren Griffins, MD  Insulin Syringe-Needle U-100 (INSULIN SYRINGE .5CC/31GX5/16") 31G X 5/16" 0.5 ML MISC 1 each by Does not apply route QID. 08/12/17   Gherghe, Costin M, MD  LATUDA 120 MG TABS Take 120 mg by mouth at bedtime. 04/12/16   [provider]  metFORMIN (GLUCOPHAGE) 1000 MG tablet Take 1 tablet (1,000 mg total) by mouth 2 (two) times daily. Patient taking differently: Take 1,500 mg by mouth 2 (two) times daily.  07/16/14   Pisciotta, [place]ra Ricks, PA-C  metoCLOPramide (REGLAN) 5 MG tablet  Take 1 tablet (5 mg total) by mouth 3 (three) times daily before meals. 09/08/17   Bonnielee Haff, MD  metoprolol tartrate (LOPRESSOR) 25 MG tablet Take 0.5 tablets (12.5 mg total) by mouth 2 (two) times daily. 02/11/17   Hosie Poisson, MD  mirtazapine (REMERON) 15 MG tablet Take 15 mg by mouth at bedtime. 12/22/17   [provider]  nicotine (NICODERM CQ - DOSED IN MG/24 HOURS) 21 mg/24hr patch Place 1 patch (21 mg total) onto the skin daily. 02/12/17   Hosie Poisson, MD  nitroGLYCERIN (NITRO-DUR) 0.2 mg/hr patch Place 1 patch (0.2 mg total) onto the skin daily. 01/16/18   Newt Minion, MD  NOVOLOG 100 UNIT/ML injection Inject 0-30 Units into the skin 3 (three) times daily with meals. Sliding Scale:  >300 15 units < 300 do not use 08/12/17   Gherghe, Vella Redhead, MD  oxcarbazepine (TRILEPTAL) 600 MG tablet Take 600 mg by mouth 2 (two) times daily. 04/12/16   [provider]  oxyCODONE-acetaminophen (PERCOCET/ROXICET) 5-325 MG tablet Take 1 tablet by mouth every 8 (eight) hours as needed for up to 7 days. 04/20/18 04/27/18  Suzan Slick, NP  pantoprazole (PROTONIX) 40 MG tablet Take 1 tablet (40 mg total) by mouth daily. 02/11/17   Hosie Poisson, MD  pentoxifylline (TRENTAL) 400 MG CR tablet Take 1 tablet (400 mg total) by mouth 3 (three) times daily with meals. 01/16/18   Newt Minion, MD  PROAIR HFA 108 [id number] BASE) MCG/ACT inhaler Inhale 2 puffs into the lungs every 6 (six) hours as needed for shortness of breath.  01/10/15   [provider]  silver sulfADIAZINE (SILVADENE) 1 % cream Apply 1 application topically daily. 04/20/18   Suzan Slick, NP  simvastatin (ZOCOR) 40 MG tablet Take 40 mg by mouth every evening.  04/25/16   [provider]  tamsulosin (FLOMAX) 0.4 MG CAPS capsule Take 0.4 mg by mouth daily. 04/12/16   [provider]  zolpidem (AMBIEN) 10 MG tablet Take 10 mg by mouth at bedtime as needed for sleep. 04/26/16   [provider]     Allergies Hydrocodone-acetaminophen and Tylenol [acetaminophen]  Family History  Problem Relation Age of Onset  . Diabetes Mellitus II Mother   . Heart disease Mother   . Heart disease Father     Social History Social History   Tobacco Use  . Smoking status: Current Every Day Smoker    Packs/day: 1.00    Years: 28.00    Pack years: 28.00    Types: Cigarettes  . Smokeless tobacco: Never Used  Substance Use Topics  . Alcohol use: No  . Drug use: No    Review of Systems Constitutional: Positive for subjective fevers Eyes: No visual changes. ENT: No sore throat. Cardiovascular: Denies chest pain. Respiratory: Denies shortness of breath. Gastrointestinal: No abdominal pain.  No nausea, no vomiting.  No diarrhea.   Genitourinary: Negative for dysuria. Musculoskeletal: Positive for left below the knee amputation site pain Skin: Negative for rash. Neurological: Negative for headaches, focal weakness or numbness.  ____________________________________________   PHYSICAL EXAM:  VITAL SIGNS: ED Triage Vitals [04/25/18 2021]  Enc Vitals Group     BP (!) 153/96     Pulse Rate 97     Resp      Temp 98.1 F (36.7 C)     Temp Source Oral     SpO2 100 %   Constitutional: Alert and oriented.  Eyes: Conjunctivae are normal.  ENT      Head: Normocephalic and atraumatic.      Nose: No congestion/rhinnorhea.      Mouth/Throat: Mucous membranes are moist.      Neck: No stridor. Hematological/Lymphatic/Immunilogical: No cervical lymphadenopathy. Cardiovascular: Normal rate, regular rhythm.  No murmurs, rubs, or gallops.  Respiratory: Normal respiratory effort without tachypnea nor retractions. Breath sounds are clear and equal bilaterally. No wheezes/rales/rhonchi. Gastrointestinal: Soft and non tender. No rebound. No guarding.  Genitourinary: Deferred Musculoskeletal: Left below the knee amputation.  Dehiscence of the wound with purulent drainage Neurologic:  Normal  speech and language. No gross focal neurologic deficits are appreciated.  Skin:  Skin is warm, dry and intact. No rash noted. Psychiatric: Mood and affect are normal. Speech and behavior are normal. Patient exhibits appropriate insight and judgment.  ____________________________________________    LABS (pertinent positives/negatives)  CBC wbc 11.1, hgb 10.9, plt 295 BMP na 134, glu 247, cr 1.93 Lactic acid 0.9 UA cloudy, wbc 6-10, rare bacteria ____________________________________________  EKG  None  ____________________________________________    RADIOLOGY  Left tib/fib Subcutaneous gas at site of amputation  ____________________________________________   PROCEDURES  Procedures  ____________________________________________   INITIAL IMPRESSION / ASSESSMENT AND PLAN / ED COURSE  Pertinent labs & imaging results that were available during my care of the patient were reviewed by me and considered in my medical decision making (see chart for details).   Patient presented to the emergency department because of concern for pain and possible infection to site of amputation. On exam there is clear dehiscence. Concern for some infection given malodor. Will start on antibiotics. Blood work without significant leukocytosis, lactic acid wnl. Discussed importance of follow up with surgeon.  ____________________________________________   FINAL CLINICAL IMPRESSION(S) / ED DIAGNOSES  Final diagnoses:  Wound dehiscence     Note: This dictation was prepared with Dragon dictation. Any transcriptional errors that result from this process are unintentional     Nance Pear, MD 04/26/18 [id number]

## 2018-04-25 NOTE — Discharge Instructions (Addendum)
Please seek medical attention for any high fevers, chest pain, shortness of breath, change in behavior, persistent vomiting, bloody stool or any other new or concerning symptoms.  

## 2018-04-25 NOTE — ED Triage Notes (Signed)
Pt in triage with nephew and daughter, pt is drowsy, not able to answer question, poor medical historian. Pt sts she had an amputation to her lower left leg, unable to tell this RN when, and pt had a fall and since has opened surgical wound to the amputation site with obvious infection. Pt sts, " I have infection coming out of my eyes and mouth." Pt asked by this RN who helps pt at the home, pt sts, "my 37 year old daughter takes care of me. I don't have any help." Pt seen at surgeons office 2-3 days ago. Pt denies drugs and alcohol.

## 2018-04-26 NOTE — ED Notes (Signed)
Wound dressed and supplies sent with pt.

## 2018-04-28 LAB — URINE CULTURE: Culture: >=100000 — AB

## 2018-04-30 LAB — AEROBIC CULTURE  (SUPERFICIAL SPECIMEN)

## 2018-04-30 LAB — AEROBIC CULTURE W GRAM STAIN (SUPERFICIAL SPECIMEN)

## 2018-05-03 ENCOUNTER — Ambulatory Visit (INDEPENDENT_AMBULATORY_CARE_PROVIDER_SITE_OTHER): Payer: Medicaid Other | Admitting: Family

## 2018-05-16 ENCOUNTER — Ambulatory Visit (INDEPENDENT_AMBULATORY_CARE_PROVIDER_SITE_OTHER): Payer: Medicaid Other | Admitting: Orthopedic Surgery

## 2018-05-16 IMAGING — DX DG FOOT COMPLETE 3+V*L*
3 series · 3 of 3 positions shown · non-contrast
Comparison: Radiographs February 01, 2017.

CLINICAL DATA: Left foot pain after amputation.

EXAM:
LEFT FOOT - COMPLETE 3+ VIEW

[foot ap]
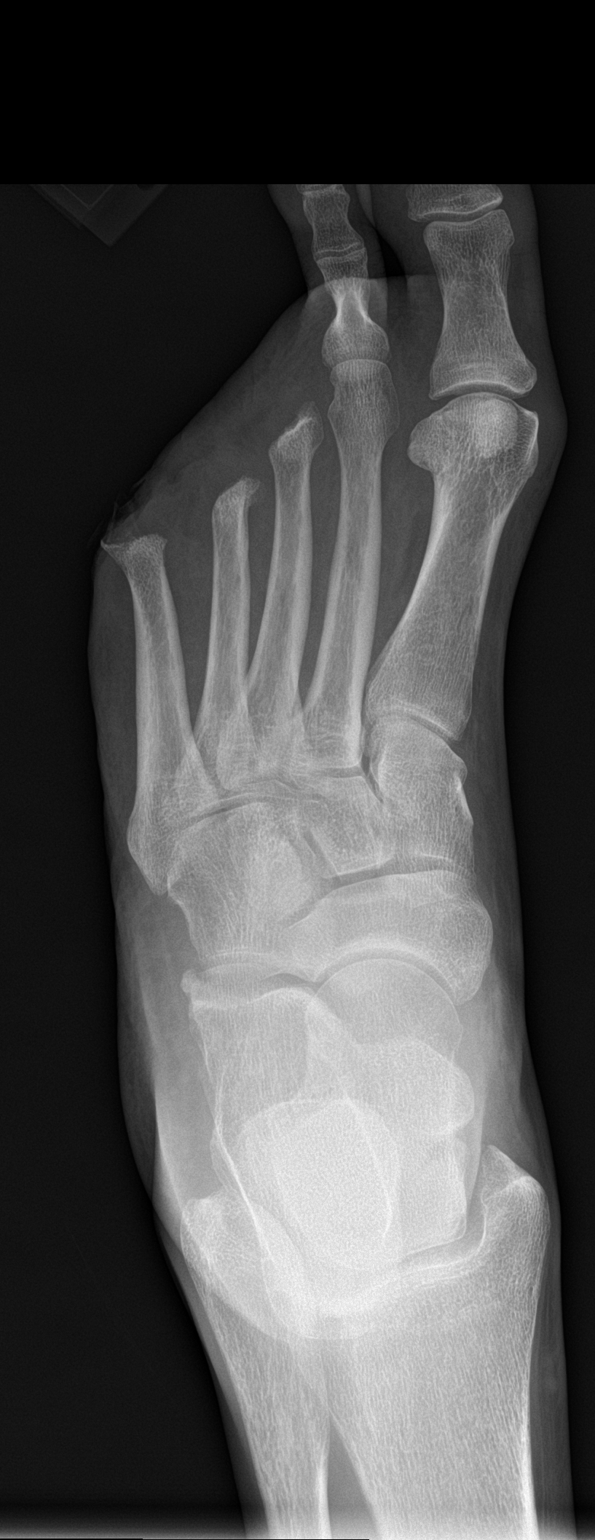

[foot obl]
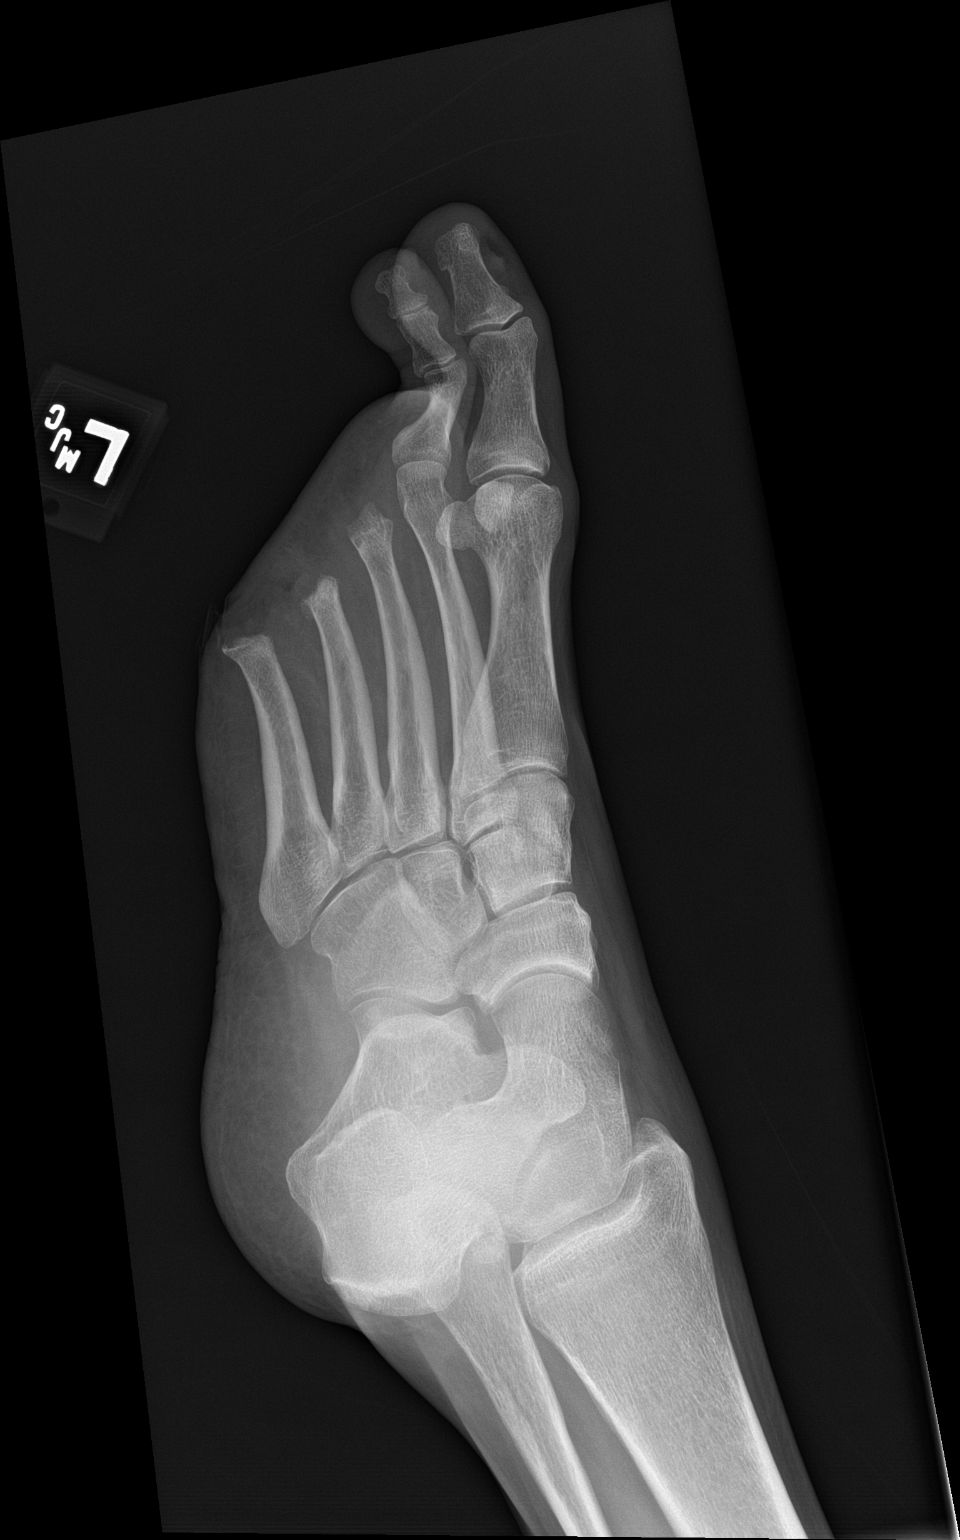

[foot lat]
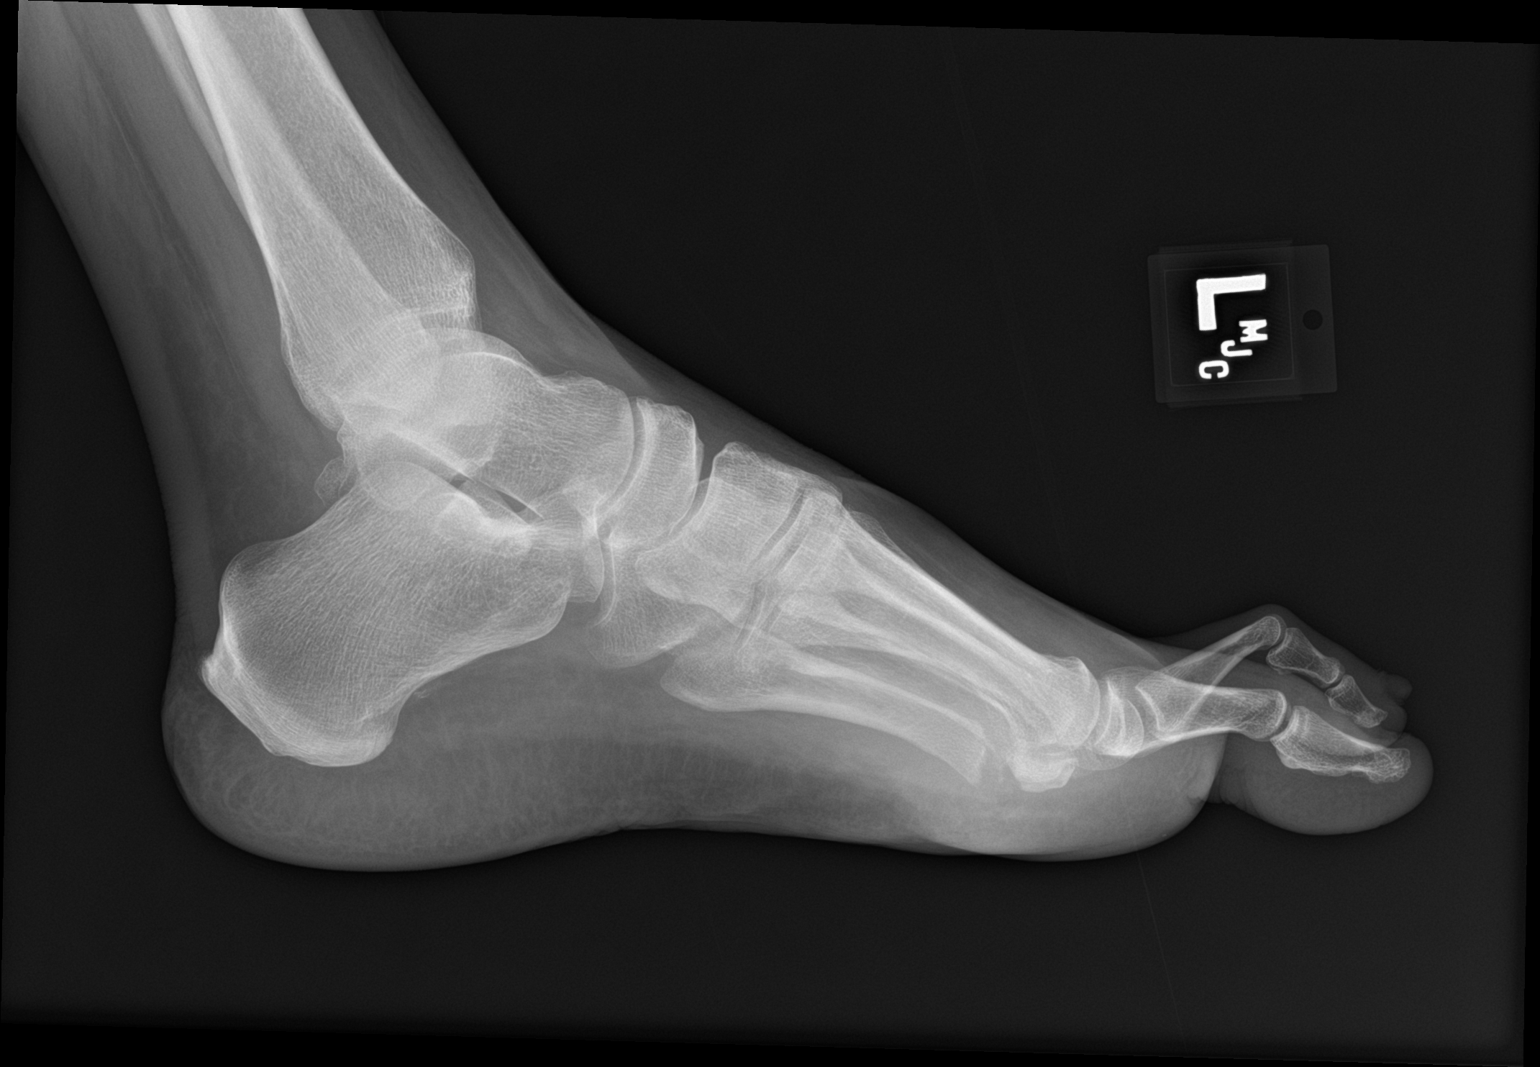

[3 of 3 positions shown; findings below may reference images not displayed]

FINDINGS: Status post surgical amputation of the distal portions of the third,
fourth and fifth metatarsals and more distal phalanges. No acute
fracture or dislocation is noted. No lytic destruction is seen to
suggest osteomyelitis. No soft tissue abnormality is noted.
IMPRESSION: Status post surgical amputation as described above. No lytic
destruction is seen to suggest osteomyelitis.

## 2018-05-17 ENCOUNTER — Ambulatory Visit (INDEPENDENT_AMBULATORY_CARE_PROVIDER_SITE_OTHER): Payer: Medicaid Other | Admitting: Family

## 2018-05-17 ENCOUNTER — Encounter (INDEPENDENT_AMBULATORY_CARE_PROVIDER_SITE_OTHER): Payer: Self-pay | Admitting: Family

## 2018-05-17 DIAGNOSIS — T8131XD Disruption of external operation (surgical) wound, not elsewhere classified, subsequent encounter: Secondary | ICD-10-CM

## 2018-05-17 MED ORDER — OXYCODONE-ACETAMINOPHEN 5-325 MG PO TABS: 1.0000 | ORAL_TABLET | Freq: Four times a day (QID) | ORAL | 0 refills | Status: DC | PRN

## 2018-05-17 MED ORDER — DOXYCYCLINE HYCLATE 100 MG PO CAPS: 100.0000 mg | ORAL_CAPSULE | Freq: Two times a day (BID) | ORAL | 0 refills | Status: DC

## 2018-05-17 NOTE — Progress Notes (Signed)
Post-Op Visit Note   Patient: Kathy Phelps           Date of Birth: 08-26-1981           MRN: [id number] Visit Date: 05/17/2018 PCP: Patient, No Pcp Per  Chief Complaint:  Chief Complaint  Patient presents with  . Left Leg - Follow-up    HPI:  HPI  The patient is a 37 year old woman presents today status post left below the knee amputation in April of this year.  This has dehisced.  Patient states she is currently taking Bactrim.  Having "extreme" pain.  Denies fevers or chills.  Ortho Exam On examination the incision has dehisced laterally this is open approximately 6 cm x 3 cm this does probe to the fibula.  There is 100% fibrinous tissue in the wound bed no granulation.  There is no drainage today slight erythema.  No ascending cellulitis.  Visit Diagnoses: No diagnosis found.  Plan: We will place the patient on doxycycline.  Did provide a prescription for Percocet for pain.  She will follow-up Wednesday for revision surgery of her left below the knee amputation.  Patient is in agreement with plan.  Have discussed return precautions patient will call or report to the emergency department.  Follow-Up Instructions: No follow-ups on file.   Imaging: No results found.  Orders:  No orders of the defined types were placed in this encounter.  Meds ordered this encounter  Medications  . doxycycline (VIBRAMYCIN) 100 MG capsule    Sig: Take 1 capsule (100 mg total) by mouth 2 (two) times daily.    Dispense:  28 capsule    Refill:  0  . oxyCODONE-acetaminophen (PERCOCET/ROXICET) 5-325 MG tablet    Sig: Take 1 tablet by mouth every 6 (six) hours as needed for severe pain.    Dispense:  30 tablet    Refill:  0     PMFS History: Patient Active Problem List   Diagnosis Date Noted  . History of left below knee amputation ([place]) 02/01/2018  . Gangrene of left foot ([place])   . Dehiscence of amputation stump ([place]) 01/26/2018  . Surgical wound, non healing 09/02/2017  . Bipolar  1 disorder ([place]) 09/02/2017  . Nausea vomiting and diarrhea 08/11/2017  . Great toe pain, left 08/11/2017  . GERD (gastroesophageal reflux disease) 08/10/2017  . Anxiety 08/10/2017  . Edema   . Cellulitis   . AKI (acute kidney injury) ([place]) 02/04/2017  . Type II diabetes mellitus with renal manifestations, uncontrolled ([place]) 02/02/2017  . HLD (hyperlipidemia) 02/02/2017  . Tobacco abuse 02/02/2017  . Essential hypertension 02/02/2017  . Malnutrition of moderate degree 02/02/2017   Past Medical History:  Diagnosis Date  . Anxiety   . Asthma   . Bipolar affective ([place])   . Complete miscarriage   . Constipation   . Depression   . Diabetes mellitus    Type II  . GERD (gastroesophageal reflux disease)   . Headache    "migraraines"  . Hyperlipidemia   . Hypertension   . Osteomyelitis ([place])    left foot  . Pregnancy complication    HELP Syndrome  . Renal disorder   . Schizophrenia ([place])   . Stroke [facility])    " mild stroke", memory loss- approx 2017    Family History  Problem Relation Age of Onset  . Diabetes Mellitus II Mother   . Heart disease Mother   . Heart disease Father     Past Surgical History:  Procedure Laterality Date  . ABDOMINAL AORTOGRAM N/A 09/07/2017   Procedure: ABDOMINAL AORTOGRAM;  Surgeon: Waynetta Sandy, MD;  Location: [place] [facility];  Service: Cardiovascular;  Laterality: N/A;  . ABDOMINAL AORTOGRAM W/LOWER EXTREMITY N/A 02/03/2017   Procedure: Abdominal Aortogram w/Lower Extremity;  Surgeon: Waynetta Sandy, MD;  Location: [place] [facility];  Service: Cardiovascular;  Laterality: N/A;  . AMPUTATION Left 02/10/2017   Procedure: AMPUTATION TOES 3, 4 AND 5  LEFT FOOT;  Surgeon: Waynetta Sandy, MD;  Location: [place];  Service: Vascular;  Laterality: Left;  . AMPUTATION Left 12/21/2017   Procedure: LEFT FOOT 5TH RAY AMPUTATION;  Surgeon: Newt Minion, MD;  Location: [place];  Service: Orthopedics;  Laterality: Left;  .  AMPUTATION Left 02/01/2018   Procedure: LEFT BELOW KNEE AMPUTATION;  Surgeon: Newt Minion, MD;  Location: [place];  Service: Orthopedics;  Laterality: Left;  . CESAREAN SECTION    . CHOLECYSTECTOMY    . LOWER EXTREMITY ANGIOGRAPHY Bilateral 09/07/2017   Procedure: Lower Extremity Angiography;  Surgeon: Waynetta Sandy, MD;  Location: [place] [facility];  Service: Cardiovascular;  Laterality: Bilateral;  . PERIPHERAL VASCULAR ATHERECTOMY  02/03/2017   Procedure: Peripheral Vascular Atherectomy;  Surgeon: Waynetta Sandy, MD;  Location: [place] [facility];  Service: Cardiovascular;;  . PERIPHERAL VASCULAR BALLOON ANGIOPLASTY  02/03/2017   Procedure: Peripheral Vascular Balloon Angioplasty;  Surgeon: Waynetta Sandy, MD;  Location: [place] [facility];  Service: Cardiovascular;;  . PERIPHERAL VASCULAR INTERVENTION Left 09/07/2017   Procedure: PERIPHERAL VASCULAR INTERVENTION;  Surgeon: Waynetta Sandy, MD;  Location: [place] [facility];  Service: Cardiovascular;  Laterality: Left;  SFA/POPLITEAL  . TUBAL LIGATION     Social History   Occupational History  . Not on file  Tobacco Use  . Smoking status: Current Every Day Smoker    Packs/day: 1.00    Years: 28.00    Pack years: 28.00    Types: Cigarettes  . Smokeless tobacco: Never Used  Substance and Sexual Activity  . Alcohol use: No  . Drug use: No  . Sexual activity: Yes    Birth control/protection: None

## 2018-05-18 ENCOUNTER — Other Ambulatory Visit (INDEPENDENT_AMBULATORY_CARE_PROVIDER_SITE_OTHER): Payer: Self-pay | Admitting: Orthopedic Surgery

## 2018-05-18 DIAGNOSIS — T8781 Dehiscence of amputation stump: Secondary | ICD-10-CM

## 2018-05-23 ENCOUNTER — Encounter (HOSPITAL_COMMUNITY): Payer: Self-pay | Admitting: *Deleted

## 2018-05-23 ENCOUNTER — Other Ambulatory Visit: Payer: Self-pay

## 2018-05-23 NOTE — Progress Notes (Signed)
Pt C/O SOB  " only at night and I stop breathing in my sleep too for about the last three weeks. "  Left a voice with Malachy Mood, Surgical Coordinator, to make MD aware that pt C/O new onset of SOB and apneic episodes during sleep for the past 3 weeks- Ebony Hail, [place], Anesthesia, also made aware; no new orders. Pt denies being under the care of a cardiologist. Pt denies having a stress test and cardiac cath. Pt denies having a chest x ray within the last year.Pt denies recent labs. Pt made aware to stop taking vitamins, fish oil and herbal medications. Do not take any NSAIDs ie: Ibuprofen, Advil, Naproxen (Naprosyn/ Aleve), Motrin, BC and Goody Powder. Pt made aware to take 15 units of Lantus insulin tonight. Pt made aware to take 15 units of Lantus insulin the morning of surgery if blood glucose (BG ) is > 70. Pt made aware to not take Metformin on morning of surgery. Pt made aware to check BG every 2 hours prior to arrival to hospital on DOS. Pt made aware to treat a BG < 70 with  4 ounces of apple or cranberry juice, wait 15 minutes after intervention to recheck BG, if BG remains < 70, call Short Stay unit to speak with a nurse. Pt verbalized understanding of all pre-op instructions.

## 2018-05-24 ENCOUNTER — Other Ambulatory Visit: Payer: Self-pay

## 2018-05-24 ENCOUNTER — Inpatient Hospital Stay (HOSPITAL_COMMUNITY): Payer: Medicaid Other | Admitting: Anesthesiology

## 2018-05-24 ENCOUNTER — Inpatient Hospital Stay (HOSPITAL_COMMUNITY)
Admission: RE | Admit: 2018-05-24 | Discharge: 2018-05-25 | DRG: 476 | Disposition: A | Discharge status: Home / Self Care | Payer: Medicaid Other | Attending: Orthopedic Surgery | Admitting: Orthopedic Surgery

## 2018-05-24 ENCOUNTER — Encounter (HOSPITAL_COMMUNITY)
Admission: RE | Disposition: A | Discharge status: Home / Self Care | Payer: Self-pay | Source: Home / Self Care | Attending: Orthopedic Surgery

## 2018-05-24 ENCOUNTER — Encounter (HOSPITAL_COMMUNITY): Payer: Self-pay

## 2018-05-24 DIAGNOSIS — Z886 Allergy status to analgesic agent status: Secondary | ICD-10-CM

## 2018-05-24 DIAGNOSIS — Z794 Long term (current) use of insulin: Secondary | ICD-10-CM

## 2018-05-24 DIAGNOSIS — T8781 Dehiscence of amputation stump: Secondary | ICD-10-CM | POA: Diagnosis present

## 2018-05-24 DIAGNOSIS — E119 Type 2 diabetes mellitus without complications: Secondary | ICD-10-CM | POA: Diagnosis present

## 2018-05-24 DIAGNOSIS — K219 Gastro-esophageal reflux disease without esophagitis: Secondary | ICD-10-CM | POA: Diagnosis present

## 2018-05-24 DIAGNOSIS — F17210 Nicotine dependence, cigarettes, uncomplicated: Secondary | ICD-10-CM | POA: Diagnosis present

## 2018-05-24 DIAGNOSIS — I10 Essential (primary) hypertension: Secondary | ICD-10-CM | POA: Diagnosis present

## 2018-05-24 DIAGNOSIS — Z885 Allergy status to narcotic agent status: Secondary | ICD-10-CM

## 2018-05-24 DIAGNOSIS — Z89512 Acquired absence of left leg below knee: Secondary | ICD-10-CM

## 2018-05-24 DIAGNOSIS — Z79899 Other long term (current) drug therapy: Secondary | ICD-10-CM

## 2018-05-24 DIAGNOSIS — T8130XA Disruption of wound, unspecified, initial encounter: Secondary | ICD-10-CM | POA: Diagnosis present

## 2018-05-24 HISTORY — PX: STUMP REVISION: SHX6102

## 2018-05-24 HISTORY — DX: Dehiscence of amputation stump: T87.81

## 2018-05-24 HISTORY — DX: Dyspnea, unspecified: R06.00

## 2018-05-24 LAB — CBC
HEMATOCRIT: 33.5 % — AB (ref 36.0–46.0)
HEMOGLOBIN: 10.7 g/dL — AB (ref 12.0–15.0)
MCH: 30 pg (ref 26.0–34.0)
MCHC: 31.9 g/dL (ref 30.0–36.0)
MCV: 93.8 fL (ref 78.0–100.0)
Platelets: 304 10*3/uL (ref 150–400)
RBC: 3.57 MIL/uL — AB (ref 3.87–5.11)
RDW: 12.7 % (ref 11.5–15.5)
WBC: 9 10*3/uL (ref 4.0–10.5)

## 2018-05-24 LAB — GLUCOSE, CAPILLARY
GLUCOSE-CAPILLARY: 158 mg/dL — AB (ref 70–99)
GLUCOSE-CAPILLARY: 165 mg/dL — AB (ref 70–99)
Glucose-Capillary: 260 mg/dL — ABNORMAL HIGH (ref 70–99)
Glucose-Capillary: 263 mg/dL — ABNORMAL HIGH (ref 70–99)

## 2018-05-24 LAB — BASIC METABOLIC PANEL
ANION GAP: 9 (ref 5–15)
BUN: 31 mg/dL — ABNORMAL HIGH (ref 6–20)
CALCIUM: 8.3 mg/dL — AB (ref 8.9–10.3)
CHLORIDE: 108 mmol/L (ref 98–111)
CO2: 19 mmol/L — AB (ref 22–32)
Creatinine, Ser: 2.13 mg/dL — ABNORMAL HIGH (ref 0.44–1.00)
GFR calc non Af Amer: 28 mL/min — ABNORMAL LOW (ref >60)
GFR, EST AFRICAN AMERICAN: 33 mL/min — AB (ref >60)
GLUCOSE: 259 mg/dL — AB (ref 70–99)
POTASSIUM: 4.7 mmol/L (ref 3.5–5.1)
Sodium: 136 mmol/L (ref 135–145)

## 2018-05-24 LAB — HEMOGLOBIN A1C
Hgb A1c MFr Bld: 8 % — ABNORMAL HIGH (ref 4.8–5.6)
Mean Plasma Glucose: 182.9 mg/dL

## 2018-05-24 LAB — POCT PREGNANCY, URINE: PREG TEST UR: NEGATIVE

## 2018-05-24 SURGERY — REVISION, AMPUTATION SITE
Anesthesia: General | Laterality: Left

## 2018-05-24 MED ORDER — METHOCARBAMOL 500 MG PO TABS
500.0000 mg | ORAL_TABLET | Freq: Four times a day (QID) | ORAL | Status: DC | PRN
  Administered 2018-05-24 (×2): 500 mg via ORAL
  Filled 2018-05-24: qty 1

## 2018-05-24 MED ORDER — PROPOFOL 10 MG/ML IV BOLUS
INTRAVENOUS | Status: DC | PRN
  Administered 2018-05-24: 170 mg via INTRAVENOUS

## 2018-05-24 MED ORDER — MOMETASONE FURO-FORMOTEROL FUM 100-5 MCG/ACT IN AERO
2.0000 | INHALATION_SPRAY | Freq: Two times a day (BID) | RESPIRATORY_TRACT | Status: DC
  Filled 2018-05-24: qty 8.8

## 2018-05-24 MED ORDER — HYDROMORPHONE HCL 1 MG/ML IJ SOLN
0.2500 mg | INTRAMUSCULAR | Status: DC | PRN
  Administered 2018-05-24 (×2): 0.5 mg via INTRAVENOUS

## 2018-05-24 MED ORDER — ACETAMINOPHEN 325 MG PO TABS: 325.0000 mg | ORAL_TABLET | Freq: Four times a day (QID) | ORAL | Status: DC | PRN

## 2018-05-24 MED ORDER — HYDROMORPHONE HCL 1 MG/ML IJ SOLN
INTRAMUSCULAR | Status: AC
  Administered 2018-05-24: 0.5 mg via INTRAVENOUS
  Filled 2018-05-24: qty 1

## 2018-05-24 MED ORDER — ASPIRIN EC 325 MG PO TBEC
325.0000 mg | DELAYED_RELEASE_TABLET | Freq: Every day | ORAL | Status: DC
  Administered 2018-05-24 – 2018-05-25 (×2): 325 mg via ORAL
  Filled 2018-05-24 (×2): qty 1

## 2018-05-24 MED ORDER — CEFAZOLIN SODIUM-DEXTROSE 1-4 GM/50ML-% IV SOLN
INTRAVENOUS | Status: AC
  Filled 2018-05-24: qty 50

## 2018-05-24 MED ORDER — METOCLOPRAMIDE HCL 5 MG PO TABS: 5.0000 mg | ORAL_TABLET | Freq: Three times a day (TID) | ORAL | Status: DC | PRN

## 2018-05-24 MED ORDER — SIMVASTATIN 40 MG PO TABS
40.0000 mg | ORAL_TABLET | Freq: Every evening | ORAL | Status: DC
  Administered 2018-05-24: 40 mg via ORAL
  Filled 2018-05-24: qty 1

## 2018-05-24 MED ORDER — MIDAZOLAM HCL 2 MG/2ML IJ SOLN
INTRAMUSCULAR | Status: AC
  Filled 2018-05-24: qty 2

## 2018-05-24 MED ORDER — METHOCARBAMOL 500 MG PO TABS
ORAL_TABLET | ORAL | Status: AC
  Filled 2018-05-24: qty 1

## 2018-05-24 MED ORDER — INSULIN GLARGINE 100 UNIT/ML ~~LOC~~ SOLN
20.0000 [IU] | Freq: Every day | SUBCUTANEOUS | Status: DC
  Administered 2018-05-24: 20 [IU] via SUBCUTANEOUS
  Filled 2018-05-24 (×2): qty 0.2

## 2018-05-24 MED ORDER — FAMOTIDINE 20 MG PO TABS
20.0000 mg | ORAL_TABLET | Freq: Two times a day (BID) | ORAL | Status: DC
  Administered 2018-05-24 – 2018-05-25 (×2): 20 mg via ORAL
  Filled 2018-05-24 (×2): qty 1

## 2018-05-24 MED ORDER — CEFAZOLIN SODIUM-DEXTROSE 1-4 GM/50ML-% IV SOLN
1.0000 g | Freq: Four times a day (QID) | INTRAVENOUS | Status: AC
  Administered 2018-05-24 – 2018-05-25 (×3): 1 g via INTRAVENOUS
  Filled 2018-05-24 (×2): qty 50

## 2018-05-24 MED ORDER — PHENYLEPHRINE 40 MCG/ML (10ML) SYRINGE FOR IV PUSH (FOR BLOOD PRESSURE SUPPORT)
PREFILLED_SYRINGE | INTRAVENOUS | Status: AC
  Filled 2018-05-24: qty 10

## 2018-05-24 MED ORDER — FENTANYL CITRATE (PF) 100 MCG/2ML IJ SOLN: 25.0000 ug | INTRAMUSCULAR | Status: DC | PRN

## 2018-05-24 MED ORDER — FENTANYL CITRATE (PF) 250 MCG/5ML IJ SOLN
INTRAMUSCULAR | Status: AC
  Filled 2018-05-24: qty 5

## 2018-05-24 MED ORDER — METOPROLOL TARTRATE 12.5 MG HALF TABLET
12.5000 mg | ORAL_TABLET | Freq: Once | ORAL | Status: AC
  Administered 2018-05-24: 12.5 mg via ORAL
  Filled 2018-05-24: qty 1

## 2018-05-24 MED ORDER — METFORMIN HCL 500 MG PO TABS: 1000.0000 mg | ORAL_TABLET | Freq: Two times a day (BID) | ORAL | Status: DC

## 2018-05-24 MED ORDER — DEXAMETHASONE SODIUM PHOSPHATE 10 MG/ML IJ SOLN
INTRAMUSCULAR | Status: AC
  Filled 2018-05-24: qty 4

## 2018-05-24 MED ORDER — ZOLPIDEM TARTRATE 5 MG PO TABS: 5.0000 mg | ORAL_TABLET | Freq: Every evening | ORAL | Status: DC | PRN

## 2018-05-24 MED ORDER — ONDANSETRON HCL 4 MG/2ML IJ SOLN: 4.0000 mg | Freq: Four times a day (QID) | INTRAMUSCULAR | Status: DC | PRN

## 2018-05-24 MED ORDER — ALBUTEROL SULFATE (2.5 MG/3ML) 0.083% IN NEBU: 3.0000 mL | INHALATION_SOLUTION | Freq: Four times a day (QID) | RESPIRATORY_TRACT | Status: DC | PRN

## 2018-05-24 MED ORDER — INSULIN ASPART 100 UNIT/ML ~~LOC~~ SOLN
4.0000 [IU] | Freq: Three times a day (TID) | SUBCUTANEOUS | Status: DC
  Administered 2018-05-24 – 2018-05-25 (×3): 4 [IU] via SUBCUTANEOUS

## 2018-05-24 MED ORDER — CEFAZOLIN SODIUM-DEXTROSE 2-4 GM/100ML-% IV SOLN
2.0000 g | INTRAVENOUS | Status: AC
  Administered 2018-05-24: 2 g via INTRAVENOUS
  Filled 2018-05-24: qty 100

## 2018-05-24 MED ORDER — HYDROMORPHONE HCL 1 MG/ML IJ SOLN
0.5000 mg | INTRAMUSCULAR | Status: DC | PRN
  Administered 2018-05-24: 1 mg via INTRAVENOUS
  Administered 2018-05-24 (×2): 0.5 mg via INTRAVENOUS
  Administered 2018-05-25 (×2): 1 mg via INTRAVENOUS
  Filled 2018-05-24 (×3): qty 1

## 2018-05-24 MED ORDER — SODIUM CHLORIDE 0.9 % IR SOLN
Status: DC | PRN
  Administered 2018-05-24: 1000 mL

## 2018-05-24 MED ORDER — ONDANSETRON HCL 4 MG PO TABS: 4.0000 mg | ORAL_TABLET | Freq: Four times a day (QID) | ORAL | Status: DC | PRN

## 2018-05-24 MED ORDER — METOPROLOL TARTRATE 12.5 MG HALF TABLET
ORAL_TABLET | ORAL | Status: AC
  Filled 2018-05-24: qty 1

## 2018-05-24 MED ORDER — CLONIDINE HCL 0.1 MG PO TABS
0.1000 mg | ORAL_TABLET | Freq: Two times a day (BID) | ORAL | Status: DC
  Administered 2018-05-24 – 2018-05-25 (×2): 0.1 mg via ORAL
  Filled 2018-05-24 (×2): qty 1

## 2018-05-24 MED ORDER — PROMETHAZINE HCL 25 MG/ML IJ SOLN: 6.2500 mg | INTRAMUSCULAR | Status: DC | PRN

## 2018-05-24 MED ORDER — METHOCARBAMOL 1000 MG/10ML IJ SOLN
500.0000 mg | Freq: Four times a day (QID) | INTRAVENOUS | Status: DC | PRN
  Filled 2018-05-24: qty 5

## 2018-05-24 MED ORDER — CHLORHEXIDINE GLUCONATE 4 % EX LIQD: 60.0000 mL | Freq: Once | CUTANEOUS | Status: DC

## 2018-05-24 MED ORDER — DOCUSATE SODIUM 100 MG PO CAPS
100.0000 mg | ORAL_CAPSULE | Freq: Two times a day (BID) | ORAL | Status: DC
  Administered 2018-05-24 – 2018-05-25 (×2): 100 mg via ORAL
  Filled 2018-05-24 (×2): qty 1

## 2018-05-24 MED ORDER — FENTANYL CITRATE (PF) 250 MCG/5ML IJ SOLN
INTRAMUSCULAR | Status: DC | PRN
  Administered 2018-05-24: 100 ug via INTRAVENOUS
  Administered 2018-05-24 (×3): 50 ug via INTRAVENOUS

## 2018-05-24 MED ORDER — ALPRAZOLAM 0.5 MG PO TABS
2.0000 mg | ORAL_TABLET | Freq: Four times a day (QID) | ORAL | Status: DC | PRN
  Administered 2018-05-24: 2 mg via ORAL
  Filled 2018-05-24: qty 4

## 2018-05-24 MED ORDER — LURASIDONE HCL 40 MG PO TABS
120.0000 mg | ORAL_TABLET | Freq: Every day | ORAL | Status: DC
  Administered 2018-05-24: 120 mg via ORAL
  Filled 2018-05-24: qty 3

## 2018-05-24 MED ORDER — BISACODYL 10 MG RE SUPP: 10.0000 mg | Freq: Every day | RECTAL | Status: DC | PRN

## 2018-05-24 MED ORDER — MAGNESIUM CITRATE PO SOLN
1.0000 | Freq: Once | ORAL | Status: DC | PRN
  Filled 2018-05-24: qty 296

## 2018-05-24 MED ORDER — INSULIN ASPART 100 UNIT/ML ~~LOC~~ SOLN
0.0000 [IU] | Freq: Three times a day (TID) | SUBCUTANEOUS | Status: DC
  Administered 2018-05-24: 5 [IU] via SUBCUTANEOUS
  Administered 2018-05-24: 8 [IU] via SUBCUTANEOUS
  Administered 2018-05-25: 3 [IU] via SUBCUTANEOUS
  Administered 2018-05-25: 5 [IU] via SUBCUTANEOUS

## 2018-05-24 MED ORDER — METOPROLOL TARTRATE 12.5 MG HALF TABLET
12.5000 mg | ORAL_TABLET | Freq: Two times a day (BID) | ORAL | Status: DC
  Administered 2018-05-24 – 2018-05-25 (×2): 12.5 mg via ORAL
  Filled 2018-05-24 (×2): qty 1

## 2018-05-24 MED ORDER — INSULIN ASPART 100 UNIT/ML ~~LOC~~ SOLN
SUBCUTANEOUS | Status: AC
  Filled 2018-05-24: qty 1

## 2018-05-24 MED ORDER — METOCLOPRAMIDE HCL 5 MG/ML IJ SOLN: 5.0000 mg | Freq: Three times a day (TID) | INTRAMUSCULAR | Status: DC | PRN

## 2018-05-24 MED ORDER — OXYCODONE HCL 5 MG PO TABS
ORAL_TABLET | ORAL | Status: AC
  Administered 2018-05-24: 10 mg via ORAL
  Filled 2018-05-24: qty 2

## 2018-05-24 MED ORDER — OXYCODONE HCL 5 MG PO TABS
5.0000 mg | ORAL_TABLET | ORAL | Status: DC | PRN
  Administered 2018-05-24: 10 mg via ORAL
  Filled 2018-05-24: qty 2

## 2018-05-24 MED ORDER — NAPROXEN 250 MG PO TABS
500.0000 mg | ORAL_TABLET | Freq: Two times a day (BID) | ORAL | Status: DC
  Administered 2018-05-25: 500 mg via ORAL
  Filled 2018-05-24 (×2): qty 2

## 2018-05-24 MED ORDER — EPHEDRINE 5 MG/ML INJ
INTRAVENOUS | Status: AC
  Filled 2018-05-24: qty 10

## 2018-05-24 MED ORDER — MIRTAZAPINE 15 MG PO TABS
15.0000 mg | ORAL_TABLET | Freq: Every day | ORAL | Status: DC
  Administered 2018-05-24: 15 mg via ORAL
  Filled 2018-05-24: qty 1

## 2018-05-24 MED ORDER — PROPOFOL 10 MG/ML IV BOLUS
INTRAVENOUS | Status: AC
  Filled 2018-05-24: qty 20

## 2018-05-24 MED ORDER — OXYCODONE HCL 5 MG PO TABS
10.0000 mg | ORAL_TABLET | ORAL | Status: DC | PRN
  Administered 2018-05-24 (×2): 10 mg via ORAL
  Filled 2018-05-24 (×2): qty 2

## 2018-05-24 MED ORDER — GABAPENTIN 400 MG PO CAPS
800.0000 mg | ORAL_CAPSULE | Freq: Two times a day (BID) | ORAL | Status: DC
  Administered 2018-05-24 – 2018-05-25 (×2): 800 mg via ORAL
  Filled 2018-05-24 (×2): qty 2

## 2018-05-24 MED ORDER — LACTATED RINGERS IV SOLN
INTRAVENOUS | Status: DC
  Administered 2018-05-24: 08:00:00 via INTRAVENOUS

## 2018-05-24 MED ORDER — SODIUM CHLORIDE 0.9 % IV SOLN
INTRAVENOUS | Status: DC
  Administered 2018-05-24: 10 mL/h via INTRAVENOUS

## 2018-05-24 MED ORDER — ONDANSETRON HCL 4 MG/2ML IJ SOLN
INTRAMUSCULAR | Status: DC | PRN
  Administered 2018-05-24: 4 mg via INTRAVENOUS

## 2018-05-24 MED ORDER — POLYETHYLENE GLYCOL 3350 17 G PO PACK: 17.0000 g | PACK | Freq: Every day | ORAL | Status: DC | PRN

## 2018-05-24 MED ORDER — LIDOCAINE 2% (20 MG/ML) 5 ML SYRINGE
INTRAMUSCULAR | Status: AC
  Filled 2018-05-24: qty 25

## 2018-05-24 MED ORDER — MIDAZOLAM HCL 5 MG/5ML IJ SOLN
INTRAMUSCULAR | Status: DC | PRN
  Administered 2018-05-24: 2 mg via INTRAVENOUS

## 2018-05-24 MED ORDER — ONDANSETRON HCL 4 MG/2ML IJ SOLN
INTRAMUSCULAR | Status: AC
  Filled 2018-05-24: qty 8

## 2018-05-24 MED ORDER — DEXAMETHASONE SODIUM PHOSPHATE 10 MG/ML IJ SOLN
INTRAMUSCULAR | Status: DC | PRN
Start: 2018-05-24 — End: 2018-05-24
  Administered 2018-05-24: 4 mg via INTRAVENOUS

## 2018-05-24 MED ORDER — LIDOCAINE HCL (CARDIAC) PF 100 MG/5ML IV SOSY
PREFILLED_SYRINGE | INTRAVENOUS | Status: DC | PRN
  Administered 2018-05-24: 40 mg via INTRAVENOUS

## 2018-05-24 SURGICAL SUPPLY — 29 items
BLADE SURG 21 STRL SS (BLADE) ×3 IMPLANT
CANISTER WOUND CARE 500ML ATS (WOUND CARE) ×3 IMPLANT
COVER SURGICAL LIGHT HANDLE (MISCELLANEOUS) ×3 IMPLANT
DRAPE EXTREMITY T 121X128X90 (DRAPE) ×3 IMPLANT
DRAPE HALF SHEET 40X57 (DRAPES) ×3 IMPLANT
DRAPE INCISE IOBAN 66X45 STRL (DRAPES) ×3 IMPLANT
DRAPE U-SHAPE 47X51 STRL (DRAPES) ×6 IMPLANT
DRSG VAC ATS MED SENSATRAC (GAUZE/BANDAGES/DRESSINGS) ×3 IMPLANT
DURAPREP 26ML APPLICATOR (WOUND CARE) ×3 IMPLANT
ELECT REM PT RETURN 9FT ADLT (ELECTROSURGICAL) ×3
ELECTRODE REM PT RTRN 9FT ADLT (ELECTROSURGICAL) ×1 IMPLANT
GLOVE BIOGEL PI IND STRL 9 (GLOVE) ×1 IMPLANT
GLOVE BIOGEL PI INDICATOR 9 (GLOVE) ×2
GLOVE SURG ORTHO 9.0 STRL STRW (GLOVE) ×3 IMPLANT
GOWN STRL REUS W/ TWL XL LVL3 (GOWN DISPOSABLE) ×2 IMPLANT
GOWN STRL REUS W/TWL XL LVL3 (GOWN DISPOSABLE) ×6
KIT BASIN OR (CUSTOM PROCEDURE TRAY) ×3 IMPLANT
KIT TURNOVER KIT B (KITS) ×3 IMPLANT
MANIFOLD NEPTUNE II (INSTRUMENTS) ×3 IMPLANT
NS IRRIG 1000ML POUR BTL (IV SOLUTION) ×3 IMPLANT
PACK GENERAL/GYN (CUSTOM PROCEDURE TRAY) ×3 IMPLANT
PAD ARMBOARD 7.5X6 YLW CONV (MISCELLANEOUS) ×3 IMPLANT
PAD NEG PRESSURE SENSATRAC (MISCELLANEOUS) IMPLANT
PREVENA RESTOR ARTHOFORM 33X30 (CANNISTER) ×3 IMPLANT
STAPLER VISISTAT 35W (STAPLE) IMPLANT
SUT ETHILON 2 0 PSLX (SUTURE) ×9 IMPLANT
SUT SILK 2 0 (SUTURE)
SUT SILK 2-0 18XBRD TIE 12 (SUTURE) IMPLANT
TOWEL OR 17X26 10 PK STRL BLUE (TOWEL DISPOSABLE) ×3 IMPLANT

## 2018-05-24 NOTE — Anesthesia Preprocedure Evaluation (Addendum)
Anesthesia Evaluation  Patient identified by MRN, date of birth, ID band Patient awake    Reviewed: Allergy & Precautions, NPO status , Patient's Chart, lab work & pertinent test results, reviewed documented beta blocker date and time   History of Anesthesia Complications Negative for: history of anesthetic complications  Airway Mallampati: I  TM Distance: >3 FB Neck ROM: Full    Dental  (+) Poor Dentition, Loose, Missing, Chipped, Dental Advisory Given   Pulmonary asthma , COPD,  COPD inhaler, Current Smoker,    breath sounds clear to auscultation       Cardiovascular hypertension, Pt. on home beta blockers and Pt. on medications (-) angina Rhythm:Regular Rate:Normal  '18 ECHO: EF 50-55%, valves OK   Neuro/Psych  Headaches, Anxiety Depression Bipolar Disorder Schizophrenia CVA, No Residual Symptoms    GI/Hepatic Neg liver ROS, GERD  Medicated and Poorly Controlled,  Endo/Other  diabetes, Insulin Dependent, Oral Hypoglycemic Agents  Renal/GU Renal InsufficiencyRenal disease (creat 1.48)     Musculoskeletal   Abdominal   Peds  Hematology  (+) Blood dyscrasia, anemia ,   Anesthesia Other Findings   Reproductive/Obstetrics                            Lab Results  Component Value Date   WBC 9.0 05/24/2018   HGB 10.7 (L) 05/24/2018   HCT 33.5 (L) 05/24/2018   MCV 93.8 05/24/2018   PLT 304 05/24/2018   Lab Results  Component Value Date   CREATININE 1.93 (H) 04/25/2018   BUN 25 (H) 04/25/2018   NA 134 (L) 04/25/2018   K 4.6 04/25/2018   CL 107 04/25/2018   CO2 21 (L) 04/25/2018    Anesthesia Physical  Anesthesia Plan  ASA: III  Anesthesia Plan: General   Post-op Pain Management:    Induction: Intravenous  PONV Risk Score and Plan: 3 and Ondansetron, Midazolam, Dexamethasone and Treatment may vary due to age or medical condition  Airway Management Planned: LMA and Oral  ETT  Additional Equipment:   Intra-op Plan:   Post-operative Plan: Extubation in OR  Informed Consent: I have reviewed the patients History and Physical, chart, labs and discussed the procedure including the risks, benefits and alternatives for the proposed anesthesia with the patient or authorized representative who has indicated his/her understanding and acceptance.   Dental advisory given  Plan Discussed with: CRNA, Surgeon and Anesthesiologist  Anesthesia Plan Comments:         Anesthesia Quick Evaluation

## 2018-05-24 NOTE — Transfer of Care (Signed)
Immediate Anesthesia Transfer of Care Note  Patient: Kathy Phelps  Procedure(s) Performed: REVISION LEFT BELOW KNEE AMPUTATION (Left )  Patient Location: PACU  Anesthesia Type:General  Level of Consciousness: awake, alert  and oriented  Airway & Oxygen Therapy: Patient Spontanous Breathing and Patient connected to nasal cannula oxygen  Post-op Assessment: Report given to RN and Post -op Vital signs reviewed and stable  Post vital signs: Reviewed and stable  Last Vitals:  Vitals Value Taken Time  BP 173/107 05/24/2018 10:56 AM  Temp    Pulse 85 05/24/2018 10:58 AM  Resp 12 05/24/2018 10:58 AM  SpO2 100 % 05/24/2018 10:58 AM  Vitals shown include unvalidated device data.  Last Pain:  Vitals:   05/24/18 0809  TempSrc:   PainSc: 7       Patients Stated Pain Goal: 3 (14/23/95 3202)  Complications: No apparent anesthesia complications

## 2018-05-24 NOTE — H&P (Signed)
Kathy Phelps is an 37 y.o. female.   Chief Complaint: Dehiscence left transtibial amputation. HPI: Patient is a 37 year old woman who is undergone a transtibial amputation.  Patient is undergone conservative wound care and still has progressive dehiscence of the wound and presents at this time for limb salvage intervention.  Past Medical History:  Diagnosis Date  . Anxiety   . Asthma   . Bipolar affective ([place])   . Complete miscarriage   . Constipation   . 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 Depression   . Diabetes mellitus    Type II  . Dyspnea     " only at night and I stop breathing in my sleep too for about the last three weeks"  . GERD (gastroesophageal reflux disease)   . Headache    "migraraines"  . Hyperlipidemia   . Hypertension   . Osteomyelitis ([place])    left foot  . Pregnancy complication    HELP Syndrome  . Renal disorder   . Schizophrenia ([place])   . Stroke [facility])    " mild stroke", memory loss- approx 2017    Past Surgical History:  Procedure Laterality Date  . ABDOMINAL AORTOGRAM N/A 09/07/2017   Procedure: ABDOMINAL AORTOGRAM;  Surgeon: Waynetta Sandy, MD;  Location: [place] [facility];  Service: Cardiovascular;  Laterality: N/A;  . ABDOMINAL AORTOGRAM W/LOWER EXTREMITY N/A 02/03/2017   Procedure: Abdominal Aortogram w/Lower Extremity;  Surgeon: Waynetta Sandy, MD;  Location: [place] [facility];  Service: Cardiovascular;  Laterality: N/A;  . AMPUTATION Left 02/10/2017   Procedure: AMPUTATION TOES 3, 4 AND 5  LEFT FOOT;  Surgeon: Waynetta Sandy, MD;  Location: [place];  Service: Vascular;  Laterality: Left;  . AMPUTATION Left 12/21/2017   Procedure: LEFT FOOT 5TH RAY AMPUTATION;  Surgeon: Newt Minion, MD;  Location: [place];  Service: Orthopedics;  Laterality: Left;  . AMPUTATION Left 02/01/2018   Procedure: LEFT BELOW KNEE AMPUTATION;  Surgeon: Newt Minion, MD;  Location: [place];  Service: Orthopedics;   Laterality: Left;  . CESAREAN SECTION    . CHOLECYSTECTOMY    . LOWER EXTREMITY ANGIOGRAPHY Bilateral 09/07/2017   Procedure: Lower Extremity Angiography;  Surgeon: Waynetta Sandy, MD;  Location: [place] [facility];  Service: Cardiovascular;  Laterality: Bilateral;  . PERIPHERAL VASCULAR ATHERECTOMY  02/03/2017   Procedure: Peripheral Vascular Atherectomy;  Surgeon: Waynetta Sandy, MD;  Location: [place] [facility];  Service: Cardiovascular;;  . PERIPHERAL VASCULAR BALLOON ANGIOPLASTY  02/03/2017   Procedure: Peripheral Vascular Balloon Angioplasty;  Surgeon: Waynetta Sandy, MD;  Location: [place] [facility];  Service: Cardiovascular;;  . PERIPHERAL VASCULAR INTERVENTION Left 09/07/2017   Procedure: PERIPHERAL VASCULAR INTERVENTION;  Surgeon: Waynetta Sandy, MD;  Location: [place] [facility];  Service: Cardiovascular;  Laterality: Left;  SFA/POPLITEAL  . TUBAL LIGATION      Family History  Problem Relation Age of Onset  . Diabetes Mellitus II Mother   . Heart disease Mother   . Heart disease Father    Social History:  reports that she has been smoking cigarettes. She has a 28.00 pack-year smoking history. She has never used smokeless tobacco. She reports that she does not drink alcohol or use drugs.  Allergies:  Allergies  Allergen Reactions  . Hydrocodone-Acetaminophen Hives  . Tylenol [Acetaminophen] Itching and Swelling    Medications Prior to Admission  Medication Sig Dispense Refill  . ADVAIR DISKUS 100-50  MCG/DOSE AEPB Inhale 1 puff into the lungs daily.  5  . alprazolam (XANAX) 2 MG tablet Take 1 tablet (2 mg total) by mouth 4 (four) times daily as needed for sleep. (Patient taking differently: Take 1 mg by mouth 5 (five) times daily. ) 30 tablet 0  . cloNIDine (CATAPRES) 0.1 MG tablet Take 0.1 mg by mouth 2 (two) times daily.  0  . doxycycline (VIBRAMYCIN) 100 MG capsule Take 1 capsule (100 mg total) by mouth 2 (two) times daily. 28  capsule 0  . famotidine (PEPCID) 20 MG tablet Take 1 tablet (20 mg total) by mouth 2 (two) times daily. (Patient taking differently: Take 20 mg by mouth daily. ) 60 tablet 0  . gabapentin (NEURONTIN) 800 MG tablet Take 1 tablet (800 mg total) by mouth 2 (two) times daily. (Patient taking differently: Take 800 mg by mouth 5 (five) times daily. ) 60 tablet 0  . insulin glargine (LANTUS) 100 UNIT/ML injection Inject 0.2-0.3 mLs (20-30 Units total) into the skin 2 (two) times daily. 20 U in the morning and 30 U in the evening (Patient taking differently: Inject 30 Units into the skin 2 (two) times daily. ) 2 vial 0  . LATUDA 120 MG TABS Take 120 mg by mouth at bedtime.  1  . metFORMIN (GLUCOPHAGE) 1000 MG tablet Take 1 tablet (1,000 mg total) by mouth 2 (two) times daily. (Patient taking differently: Take 500 mg by mouth 2 (two) times daily with a meal. ) 30 tablet 0  . metoprolol tartrate (LOPRESSOR) 25 MG tablet Take 0.5 tablets (12.5 mg total) by mouth 2 (two) times daily. 60 tablet 0  . mirtazapine (REMERON) 15 MG tablet Take 15 mg by mouth at bedtime.  0  . naproxen (NAPROSYN) 500 MG tablet Take 500 mg by mouth 2 (two) times daily.  0  . oxyCODONE-acetaminophen (PERCOCET/ROXICET) 5-325 MG tablet Take 1 tablet by mouth every 6 (six) hours as needed for severe pain. 30 tablet 0  . PROAIR HFA 108 (90 BASE) MCG/ACT inhaler Inhale 2 puffs into the lungs every 6 (six) hours as needed for shortness of breath.   0  . silver sulfADIAZINE (SILVADENE) 1 % cream Apply 1 application topically daily. 50 g 0  . simvastatin (ZOCOR) 40 MG tablet Take 40 mg by mouth every evening.   2  . zolpidem (AMBIEN) 10 MG tablet Take 10 mg by mouth at bedtime as needed for sleep.  2  . clopidogrel (PLAVIX) 75 MG tablet Take 1 tablet (75 mg total) by mouth daily with breakfast. (Patient not taking: Reported on 05/19/2018) 30 tablet 3  . Insulin Syringe-Needle U-100 (INSULIN SYRINGE .5CC/31GX5/16") 31G X 5/16" 0.5 ML MISC 1 each by  Does not apply route QID. 100 each 1  . metoCLOPramide (REGLAN) 5 MG tablet Take 1 tablet (5 mg total) by mouth 3 (three) times daily before meals. (Patient not taking: Reported on 05/19/2018) 90 tablet 0  . nicotine (NICODERM CQ - DOSED IN MG/24 HOURS) 21 mg/24hr patch Place 1 patch (21 mg total) onto the skin daily. (Patient not taking: Reported on 05/19/2018) 28 patch 0  . nitroGLYCERIN (NITRO-DUR) 0.2 mg/hr patch Place 1 patch (0.2 mg total) onto the skin daily. (Patient not taking: Reported on 05/19/2018) 30 patch 12  . NOVOLOG 100 UNIT/ML injection Inject 0-30 Units into the skin 3 (three) times daily with meals. Sliding Scale:  >300 15 units < 300 do not use (Patient not taking: Reported on 05/19/2018) 10 mL  3  . pantoprazole (PROTONIX) 40 MG tablet Take 1 tablet (40 mg total) by mouth daily. (Patient not taking: Reported on 05/19/2018) 30 tablet 0  . pentoxifylline (TRENTAL) 400 MG CR tablet Take 1 tablet (400 mg total) by mouth 3 (three) times daily with meals. (Patient not taking: Reported on 05/19/2018) 90 tablet 1    Results for orders placed or performed during the hospital encounter of 05/24/18 (from the past 48 hour(s))  Pregnancy, urine POC     Status: None   Collection Time: 05/24/18  7:56 AM  Result Value Ref Range   Preg Test, Ur NEGATIVE NEGATIVE    Comment:        THE SENSITIVITY OF THIS METHODOLOGY IS >24 mIU/mL   Glucose, capillary     Status: Abnormal   Collection Time: 05/24/18  8:01 AM  Result Value Ref Range   Glucose-Capillary 260 (H) 70 - 99 mg/dL   Comment 1 Notify RN   Hemoglobin A1c     Status: Abnormal   Collection Time: 05/24/18  8:13 AM  Result Value Ref Range   Hgb A1c MFr Bld 8.0 (H) 4.8 - 5.6 %    Comment: (NOTE) Pre diabetes:          5.7%-6.4% Diabetes:              >6.4% Glycemic control for   <7.0% adults with diabetes    Mean Plasma Glucose 182.9 mg/dL    Comment: Performed at [facility], [address], [place], [place] [address]  CBC      Status: Abnormal   Collection Time: 05/24/18  8:13 AM  Result Value Ref Range   WBC 9.0 4.0 - 10.5 K/uL   RBC 3.57 (L) 3.87 - 5.11 MIL/uL   Hemoglobin 10.7 (L) 12.0 - 15.0 g/dL   HCT 33.5 (L) 36.0 - 46.0 %   MCV 93.8 78.0 - 100.0 fL   MCH 30.0 26.0 - 34.0 pg   MCHC 31.9 30.0 - 36.0 g/dL   RDW 12.7 11.5 - 15.5 %   Platelets 304 150 - 400 K/uL    Comment: Performed at [facility], [place] [address] [place], [place] [address]   No results found.  Review of Systems  All other systems reviewed and are negative.   Blood pressure (!) 145/100, pulse 88, temperature 97.8 F (36.6 C), temperature source Oral, resp. rate 18, height 5\' 9"  (1.753 m), weight 72.6 kg, last menstrual period 04/27/2018, SpO2 100 %. Physical Exam  Examination patient is alert oriented no adenopathy well-dressed normal affect normal respiratory effort she ambulates in a wheelchair.  Examination of the left BKA she has wound dehiscence with exposed bone tendon drainage.  She has failed conservative wound care. Assessment/Plan Assessment: Dehiscence left transtibial amputation.  Plan: We will plan for revision amputation of the transtibial amputation and for placement of a wound VAC.  Anticipate discharge to home in several days.  Newt Minion, MD 05/24/2018, 9:15 AM

## 2018-05-24 NOTE — Anesthesia Procedure Notes (Signed)
Procedure Name: LMA Insertion Date/Time: 05/24/2018 10:21 AM Performed by: Mariea Clonts, CRNA Pre-anesthesia Checklist: Patient identified, Emergency Drugs available, Suction available and Patient being monitored Patient Re-evaluated:Patient Re-evaluated prior to induction Oxygen Delivery Method: Circle System Utilized Preoxygenation: Pre-oxygenation with 100% oxygen Induction Type: IV induction Ventilation: Mask ventilation without difficulty LMA: LMA inserted LMA Size: 4.0 Number of attempts: 1 Airway Equipment and Method: Bite block Placement Confirmation: positive ETCO2 Tube secured with: Tape Dental Injury: Teeth and Oropharynx as per pre-operative assessment

## 2018-05-24 NOTE — Op Note (Signed)
05/24/2018  11:02 AM  PATIENT:  Kathy Phelps    PRE-OPERATIVE DIAGNOSIS:  Dehiscence Left Below Knee Amputation  POST-OPERATIVE DIAGNOSIS:  Same  PROCEDURE:  REVISION LEFT BELOW KNEE AMPUTATION  SURGEON:  Newt Minion, MD  PHYSICIAN ASSISTANT:None ANESTHESIA:   General  PREOPERATIVE INDICATIONS:  Kathy Phelps is a  37 y.o. female with a diagnosis of Dehiscence Left Below Knee Amputation who failed conservative measures and elected for surgical management.    The risks benefits and alternatives were discussed with the patient preoperatively including but not limited to the risks of infection, bleeding, nerve injury, cardiopulmonary complications, the need for revision surgery, among others, and the patient was willing to proceed.  OPERATIVE IMPLANTS: Praveena wound VAC, restore sponge  @ENCIMAGES @  OPERATIVE FINDINGS: Good healthy muscle at the resection margins no abscess no sign of any ascending infection  OPERATIVE PROCEDURE: Patient was brought to the operating room and underwent a general anesthetic.  After adequate levels of anesthesia were obtained patient's left lower extremity was prepped using DuraPrep draped in the sterile field a timeout was called.  Patient received preoperative antibiotics.  A fishmouth incision was made around the dehisced wound this was carried down to bone the distal 2 cm of the tibia and fibula were resected.  The resection margins were clean with healthy viable contractility muscle with good color and good consistency.  Electrocautery was used for hemostasis the wound was irrigated with normal saline.  The deep and superficial fascial layers were closed with 2-0 nylon.  A restore Praveena wound VAC was applied this had a good suction fit.  Plan for overnight observation patient was extubated taken to PACU in stable condition.   DISCHARGE PLANNING:  Antibiotic duration: 24-hour antibiotics  Weightbearing: Nonweightbearing on the left.  Pain  medication: High-dose opioid pathway  Dressing care/ Wound VAC: Continue wound VAC for 2 weeks.  Ambulatory devices: Walker.  Discharge to: Home tomorrow  Follow-up: In the office 1 week post operative.

## 2018-05-25 ENCOUNTER — Encounter (HOSPITAL_COMMUNITY): Payer: Self-pay | Admitting: Orthopedic Surgery

## 2018-05-25 LAB — GLUCOSE, CAPILLARY
GLUCOSE-CAPILLARY: 164 mg/dL — AB (ref 70–99)
Glucose-Capillary: 208 mg/dL — ABNORMAL HIGH (ref 70–99)
Glucose-Capillary: 239 mg/dL — ABNORMAL HIGH (ref 70–99)
Glucose-Capillary: 244 mg/dL — ABNORMAL HIGH (ref 70–99)

## 2018-05-25 MED ORDER — OXYCODONE-ACETAMINOPHEN 10-325 MG PO TABS: 1.0000 | ORAL_TABLET | Freq: Four times a day (QID) | ORAL | 0 refills | Status: DC | PRN

## 2018-05-25 NOTE — Progress Notes (Addendum)
Inpatient Diabetes Program Recommendations  AACE/ADA: New Consensus Statement on Inpatient Glycemic Control (2015)  Target Ranges:  Prepandial:   less than 140 mg/dL      Peak postprandial:   less than 180 mg/dL (1-2 hours)      Critically ill patients:  140 - 180 mg/dL   Lab Results  Component Value Date   GLUCAP 239 (H) 05/25/2018   HGBA1C 8.0 (H) 05/24/2018    Review of Glycemic ControlResults for ALVETTA, HIDROGO (MRN [id number]) as of 05/25/2018 10:38  Ref. Range 02/02/2017 06:15 02/01/2018 10:43 05/24/2018 08:13  Hemoglobin A1C Latest Ref Range: 4.8 - 5.6 % 11.8 (H) 9.1 (H) 8.0 (H)    Diabetes history: Type 2 DM  Outpatient Diabetes medications:  Lantus 30 units bid, Metformin 500 mg bid (patient not taking Novolog per Med. rec) Current orders for Inpatient glycemic control:  Lantus 20 units bid, Novolog 4 units tid with meals, Novolog moderate tid with meals.   Inpatient Diabetes Program Recommendations:    Note plans for D/c today.  A1C is improved from past admits.  Consider increasing Lantus to 20 units bid (if patient remains in the hosptial).  Discussed improved A1C with patient. Patient very sleepy and states she is going home. She states that she does not have a PCP and that her insulin is being prescribed by her psychiatrist. Discussed with RN.  Patient needs PCP to manage DM and other chronic diseases. He states he will discuss with care management.  Thanks,  Adah Perl, RN, BC-ADM Inpatient Diabetes Coordinator Pager [phone] (8a-5p)

## 2018-05-25 NOTE — Discharge Summary (Signed)
Discharge Diagnoses:  Active Problems:   Hx of BKA, left ([place])   Surgeries: Procedure(s): REVISION LEFT BELOW KNEE AMPUTATION on 05/24/2018    Consultants:   Discharged Condition: Improved  Hospital Course: Kathy Phelps is an 37 y.o. female who was admitted 05/24/2018 with a chief complaint of dehiscence left transtibial amputation, with a final diagnosis of Dehiscence Left Below Knee Amputation.  Patient was brought to the operating room on 05/24/2018 and underwent Procedure(s): REVISION LEFT BELOW KNEE AMPUTATION.    Patient was given perioperative antibiotics:  Anti-infectives (From admission, onward)   Start     Dose/Rate Route Frequency Ordered Stop   05/24/18 1559  ceFAZolin (ANCEF) 1-4 GM/50ML-% IVPB    Note to Pharmacy:  Adonis Housekeeper   : cabinet override      05/24/18 1559 05/25/18 0414   05/24/18 1330  ceFAZolin (ANCEF) IVPB 1 g/50 mL premix     1 g 100 mL/hr over 30 Minutes Intravenous Every 6 hours 05/24/18 1321 05/25/18 0423   05/24/18 0745  ceFAZolin (ANCEF) IVPB 2g/100 mL premix     2 g 200 mL/hr over 30 Minutes Intravenous On call to O.R. 05/24/18 0744 05/24/18 1026    .  Patient was given sequential compression devices, early ambulation, and aspirin for DVT prophylaxis.  Recent vital signs:  Patient Vitals for the past 24 hrs:  BP Temp Temp src Pulse Resp SpO2 Height Weight  05/25/18 0401 114/87 97.6 F (36.4 C) Oral 83 14 93 % - -  05/25/18 0004 125/84 97.9 F (36.6 C) Oral 89 15 97 % - -  05/24/18 1950 (!) 153/101 97.6 F (36.4 C) Oral 82 14 100 % - -  05/24/18 1735 (!) 146/102 98.1 F (36.7 C) Oral 68 14 100 % - -  05/24/18 1545 - - - 71 12 99 % - -  05/24/18 1535 - - - 75 19 100 % - -  05/24/18 1530 130/82 - - 75 14 98 % - -  05/24/18 1500 122/86 - - 73 12 96 % - -  05/24/18 1430 (!) 157/99 - - 70 14 100 % - -  05/24/18 1400 (!) 166/98 - - 72 (!) 21 100 % - -  05/24/18 1340 (!) 160/96 - - 72 14 100 % - -  05/24/18 1325 (!) 148/95 - - 79 16 100 %  - -  05/24/18 1310 (!) 141/93 - - 75 (!) 9 95 % - -  05/24/18 1255 (!) 165/99 97.6 F (36.4 C) - 75 12 98 % - -  05/24/18 1210 (!) 179/98 - - 79 (!) 9 100 % - -  05/24/18 1155 (!) 185/99 - - 78 10 100 % - -  05/24/18 1140 (!) 193/99 - - 76 11 100 % - -  05/24/18 1125 (!) 186/102 - - 79 10 100 % - -  05/24/18 1115 (!) 176/103 - - 83 15 100 % - -  05/24/18 1110 (!) 184/110 - - 82 (!) 9 100 % - -  05/24/18 1100 (!) 155/97 (!) 97.2 F (36.2 C) - 83 10 100 % - -  05/24/18 1055 (!) 173/107 - - 98 12 100 % - -  05/24/18 0801 (!) 145/100 97.8 F (36.6 C) Oral 88 18 100 % 5\' 9"  (1.753 m) 72.6 kg  .  Recent laboratory studies: No results found.  Discharge Medications:   Allergies as of 05/25/2018      Reactions   Hydrocodone-acetaminophen Hives   Tylenol [acetaminophen]  Itching, Swelling      Medication List    STOP taking these medications   doxycycline 100 MG capsule Commonly known as:  VIBRAMYCIN   oxyCODONE-acetaminophen 5-325 MG tablet Commonly known as:  PERCOCET/ROXICET Replaced by:  oxyCODONE-acetaminophen 10-325 MG tablet     TAKE these medications   ADVAIR DISKUS 100-50 MCG/DOSE Aepb Generic drug:  Fluticasone-Salmeterol Inhale 1 puff into the lungs daily.   alprazolam 2 MG tablet Commonly known as:  XANAX Take 1 tablet (2 mg total) by mouth 4 (four) times daily as needed for sleep. What changed:    how much to take  when to take this   cloNIDine 0.1 MG tablet Commonly known as:  CATAPRES Take 0.1 mg by mouth 2 (two) times daily.   clopidogrel 75 MG tablet Commonly known as:  PLAVIX Take 1 tablet (75 mg total) by mouth daily with breakfast.   famotidine 20 MG tablet Commonly known as:  PEPCID Take 1 tablet (20 mg total) by mouth 2 (two) times daily. What changed:  when to take this   gabapentin 800 MG tablet Commonly known as:  NEURONTIN Take 1 tablet (800 mg total) by mouth 2 (two) times daily. What changed:  when to take this   insulin glargine  100 UNIT/ML injection Commonly known as:  LANTUS Inject 0.2-0.3 mLs (20-30 Units total) into the skin 2 (two) times daily. 20 U in the morning and 30 U in the evening What changed:    how much to take  additional instructions   INSULIN SYRINGE .5CC/31GX5/16" 31G X 5/16" 0.5 ML Misc 1 each by Does not apply route QID.   LATUDA 120 MG Tabs Generic drug:  Lurasidone HCl Take 120 mg by mouth at bedtime.   metFORMIN 1000 MG tablet Commonly known as:  GLUCOPHAGE Take 1 tablet (1,000 mg total) by mouth 2 (two) times daily. What changed:    how much to take  when to take this   metoCLOPramide 5 MG tablet Commonly known as:  REGLAN Take 1 tablet (5 mg total) by mouth 3 (three) times daily before meals.   metoprolol tartrate 25 MG tablet Commonly known as:  LOPRESSOR Take 0.5 tablets (12.5 mg total) by mouth 2 (two) times daily.   mirtazapine 15 MG tablet Commonly known as:  REMERON Take 15 mg by mouth at bedtime.   naproxen 500 MG tablet Commonly known as:  NAPROSYN Take 500 mg by mouth 2 (two) times daily.   nicotine 21 mg/24hr patch Commonly known as:  NICODERM CQ - dosed in mg/24 hours Place 1 patch (21 mg total) onto the skin daily.   nitroGLYCERIN 0.2 mg/hr patch Commonly known as:  NITRODUR - Dosed in mg/24 hr Place 1 patch (0.2 mg total) onto the skin daily.   NOVOLOG 100 UNIT/ML injection Generic drug:  insulin aspart Inject 0-30 Units into the skin 3 (three) times daily with meals. Sliding Scale:  >300 15 units < 300 do not use   oxyCODONE-acetaminophen 10-325 MG tablet Commonly known as:  PERCOCET Take 1 tablet by mouth every 6 (six) hours as needed for pain. Replaces:  oxyCODONE-acetaminophen 5-325 MG tablet   pantoprazole 40 MG tablet Commonly known as:  PROTONIX Take 1 tablet (40 mg total) by mouth daily.   pentoxifylline 400 MG CR tablet Commonly known as:  TRENTAL Take 1 tablet (400 mg total) by mouth 3 (three) times daily with meals.   PROAIR  HFA 108 (90 Base) MCG/ACT inhaler Generic drug:  albuterol  Inhale 2 puffs into the lungs every 6 (six) hours as needed for shortness of breath.   silver sulfADIAZINE 1 % cream Commonly known as:  SILVADENE Apply 1 application topically daily.   simvastatin 40 MG tablet Commonly known as:  ZOCOR Take 40 mg by mouth every evening.   zolpidem 10 MG tablet Commonly known as:  AMBIEN Take 10 mg by mouth at bedtime as needed for sleep.       Diagnostic Studies: Dg Tibia/fibula Left  Result Date: 04/25/2018 CLINICAL DATA:  Left lower leg infection. Status post prior below-knee amputation. EXAM: LEFT TIBIA AND FIBULA - 2 VIEW COMPARISON:  None. FINDINGS: There are some focal areas of soft tissue gas at the level of the distal amputation stump likely reflecting soft tissue infection. No obvious bony destruction is seen to suggest osteomyelitis by x-ray. There is a single metallic staple present near one of the foci of air. Correlation suggested whether this is visible clinically. IMPRESSION: Soft tissue gas in the distal soft tissues at the level of the amputation stump consistent with infection. There is a single metallic staple in the nearby soft tissues. No evidence to suggest obvious osteomyelitis by x-ray. Electronically Signed   By: Aletta Edouard M.D.   On: 04/25/2018 21:12    Patient benefited maximally from their hospital stay and there were no complications.     Disposition:  Discharge Instructions    Negative Pressure Wound Therapy - Incisional   Complete by:  As directed    Attached to the wound VAC to the portable Praveena wound VAC pump for discharge.  Make sure patient knows how to plug in the unit to ensure it stays charged.     Follow-up Information    Newt Minion, MD In 1 week.   Specialty:  Orthopedic Surgery Contact information: [address] [place] [address] [phone]            Signed: Newt Minion 05/25/2018, 7:24 AM

## 2018-05-25 NOTE — Care Management Note (Signed)
Case Management Note  Patient Details  Name: Kathy Phelps MRN: [id number] Date of Birth: Oct 13, 1980  Subjective/Objective:   37 yr old female admitted for revision of left BKA dehescience.                 Action/Plan: Case manager was asked to arrange PCP for patient.  After calling several Clinics, first available appointment will be on Tuesday, July 12, 2018 at 3:20pm at the [facility] , [place] [phone]. This information was placed on patient discharge instructions.    Expected Discharge Date:  05/25/18               Expected Discharge Plan:  [place]  In-House Referral:  NA  Discharge planning Services  CM Consult, Follow-up appt scheduled  Post Acute Care Choice:  NA Choice offered to:     DME Arranged:  N/A DME Agency:  NA  HH Arranged:  NA HH Agency:  NA  Status of Service:  Completed, signed off  If discussed at [place] of Stay Meetings, dates discussed:    Additional Comments:  Ninfa Meeker, RN 05/25/2018, 12:18 PM

## 2018-05-25 NOTE — Evaluation (Signed)
Physical Therapy Evaluation Patient Details Name: Kathy Phelps MRN: [id number] DOB: 11/15/1980 Today's Date: 05/25/2018   History of Present Illness  Pt is a 37 y.o. F who presents s/p dehiscence left transtibial amputation. Significant PMH includes left transtibial amputation, bipolar, schizophrenia, diabetes mellitus.   Clinical Impression  Pt admitted with above diagnosis. Pt currently with functional limitations due to the deficits listed below (see PT Problem List). At baseline, patient self propels wheelchair for mobility and uses walker for limited ambulation. She states her mobile home is wheelchair accessible. Reports history of falls with hopping and use of walker. Recommended wheelchair for mobility to prevent risk of falls. Currently, patient performing stand pivot and squat pivot transfers from bed <> wheelchair with mainly supervision. Educated patient on determining signs of infection. Pt will benefit from skilled PT to increase their independence and safety with mobility to allow discharge to the venue listed below.       Follow Up Recommendations No PT follow up;Supervision for mobility/OOB (HHPT/OPPT if pt receives prosthetic)    Equipment Recommendations  3in1 (PT)    Recommendations for Other Services       Precautions / Restrictions Precautions Precautions: Fall Precaution Comments: wound vac Restrictions Weight Bearing Restrictions: No      Mobility  Bed Mobility Overal bed mobility: Modified Independent                Transfers Overall transfer level: Needs assistance Equipment used: None Transfers: Sit to/from [employer] Sit to Stand: Min guard   Squat pivot transfers: Supervision     General transfer comment: Patient requiring min guard assist from daughter when performing stand pivot transfer from bed to chair towards right. Supervision provided by PT with squat pivot from w/c to bed towards left. Instructed patient and  patient daughter on wheelchair safety including positioning and locking brakes prior to transfer.  Ambulation/Gait             General Gait Details: deferred by patient  Stairs            Wheelchair Mobility    Modified Rankin (Stroke Patients Only)       Balance Overall balance assessment: Needs assistance Sitting-balance support: No upper extremity supported;Feet supported Sitting balance-Leahy Scale: Good     Standing balance support: No upper extremity supported;During functional activity Standing balance-Leahy Scale: Fair                               Pertinent Vitals/Pain Pain Assessment: Faces Faces Pain Scale: Hurts even more Pain Location: surgical site Pain Descriptors / Indicators: Grimacing;Guarding;Moaning Pain Intervention(s): Limited activity within patient's tolerance;Monitored during session    [place] expects to be discharged to:: Private residence Living Arrangements: Children;Spouse/significant other Available Help at Discharge: Family;Available PRN/intermittently Type of Home: Mobile home Home Access: Level entry     Home Layout: One level Home Equipment: [place] - 2 wheels;Wheelchair - manual      Prior Function Level of Independence: Needs assistance   Gait / Transfers Assistance Needed: wheelchair for mobility and walker for limited ambulation. Patient able to self propel wheelchair  ADL's / Homemaking Assistance Needed: Patient sponge bathes self as she reports she no longer has a shower chair  Comments: Reports multiple falls in past month when hopping using walker     Hand Dominance        Extremity/Trunk Assessment   Upper Extremity Assessment Upper Extremity  Assessment: Overall WFL for tasks assessed    Lower Extremity Assessment Lower Extremity Assessment: LLE deficits/detail LLE Deficits / Details: L BKA, resting in knee flexion ~5 degrees from neutral but able to actively reach  neutral with quad set    Cervical / Trunk Assessment Cervical / Trunk Assessment: Normal  Communication   Communication: No difficulties  Cognition Arousal/Alertness: Lethargic Behavior During Therapy: WFL for tasks assessed/performed Overall Cognitive Status: Within Functional Limits for tasks assessed                                 General Comments: Patient with eyes closed during beginning of session and required repeating of questions several times due to lethargy. Became more alert once sitting EOB.      General Comments General comments (skin integrity, edema, etc.): pt daughter present    Exercises     Assessment/Plan    PT Assessment Patient needs continued PT services  PT Problem List Decreased strength;Decreased range of motion;Decreased activity tolerance;Decreased balance;Decreased mobility;Pain       PT Treatment Interventions DME instruction;Gait training;Functional mobility training;Therapeutic activities;Therapeutic exercise;Balance training;Patient/family education    PT Goals (Current goals can be found in the Care Plan section)  Acute Rehab PT Goals Patient Stated Goal: go home today PT Goal Formulation: With patient Time For Goal Achievement: 06/08/18 Potential to Achieve Goals: Good    Frequency Min 5X/week   Barriers to discharge        Co-evaluation               AM-PAC PT "6 Clicks" Daily Activity  Outcome Measure Difficulty turning over in bed (including adjusting bedclothes, sheets and blankets)?: None Difficulty moving from lying on back to sitting on the side of the bed? : None Difficulty sitting down on and standing up from a chair with arms (e.g., wheelchair, bedside commode, etc,.)?: A Little Help needed moving to and from a bed to chair (including a wheelchair)?: A Little Help needed walking in hospital room?: A Little Help needed climbing 3-5 steps with a railing? : A Lot 6 Click Score: 19    End of Session  Equipment Utilized During Treatment: Gait belt Activity Tolerance: Patient tolerated treatment well Patient left: in bed;with call bell/phone within reach Nurse Communication: Mobility status PT Visit Diagnosis: Other abnormalities of gait and mobility (R26.89);History of falling (Z91.81);Pain Pain - Right/Left: Left Pain - part of body: (residual limb)    Time: 4825-0037 PT Time Calculation (min) (ACUTE ONLY): 20 min   Charges:   PT Evaluation $PT Eval Moderate Complexity: 1 Mod          Ellamae Sia, PT, DPT Acute Rehabilitation Services  Pager: [place] 05/25/2018, 8:57 AM

## 2018-05-25 NOTE — Anesthesia Postprocedure Evaluation (Signed)
Anesthesia Post Note  Patient: Kathy Phelps  Procedure(s) Performed: REVISION LEFT BELOW KNEE AMPUTATION (Left )     Patient location during evaluation: PACU Anesthesia Type: General Level of consciousness: awake and alert Pain management: pain level controlled Vital Signs Assessment: post-procedure vital signs reviewed and stable Respiratory status: spontaneous breathing, nonlabored ventilation, respiratory function stable and patient connected to nasal cannula oxygen Cardiovascular status: blood pressure returned to baseline and stable Postop Assessment: no apparent nausea or vomiting Anesthetic complications: no    Last Vitals:  Vitals:   05/25/18 0004 05/25/18 0401  BP: 125/84 114/87  Pulse: 89 83  Resp: 15 14  Temp: 36.6 C 36.4 C  SpO2: 97% 93%    Last Pain:  Vitals:   05/25/18 0726  TempSrc:   PainSc: Tyler Deis

## 2018-05-25 NOTE — Progress Notes (Signed)
Pt refused to go over discharge instructions. Pt stated her husband was downstairs waiting for her to be discharged.  Pt's IV removed.  Pt discharged home.

## 2018-06-01 ENCOUNTER — Encounter (INDEPENDENT_AMBULATORY_CARE_PROVIDER_SITE_OTHER): Payer: Self-pay | Admitting: Orthopedic Surgery

## 2018-06-01 ENCOUNTER — Ambulatory Visit (INDEPENDENT_AMBULATORY_CARE_PROVIDER_SITE_OTHER): Payer: Medicaid Other | Admitting: Orthopedic Surgery

## 2018-06-01 VITALS — Ht 69.0 in | Wt 160.0 lb

## 2018-06-01 DIAGNOSIS — T8781 Dehiscence of amputation stump: Secondary | ICD-10-CM

## 2018-06-01 MED ORDER — OXYCODONE-ACETAMINOPHEN 10-325 MG PO TABS: 1.0000 | ORAL_TABLET | Freq: Three times a day (TID) | ORAL | 0 refills | Status: DC | PRN

## 2018-06-01 NOTE — Progress Notes (Signed)
Office Visit Note   Patient: Kathy Phelps           Date of Birth: [birth year]           MRN: [id number] Visit Date: 06/01/2018              Requested by: No referring provider defined for this encounter. PCP: Patient, No Pcp Per  Chief Complaint  Patient presents with  . Left Leg - Routine Post Op    05/24/18 revision left BKA       HPI: Patient resents post revision left transtibial amputation patient states that she just got her insulin she is taking 30 units twice a day and supplementing for meals previously her blood sugars were running around 500.  Patient states that she was smoking 2 packs/day she is down to a half a pack per day.  Assessment & Plan: Visit Diagnoses:  1. Dehiscence of amputation stump (HCC)     Plan: Discussed the importance of smoking cessation for at least 2 weeks she will follow-up with [facility] she is given a prescription to get a smaller stump shrinker.  She will wear the shrinker around the clock she was able to demonstrate proper application of the shrinker.  However sutures at follow-up.  Follow-Up Instructions: Return in about 2 weeks (around 06/15/2018).   Ortho Exam  Patient is alert, oriented, no adenopathy, well-dressed, normal affect, normal respiratory effort. Examination the restore VAC is removed.  Patient has had excellent decrease in the swelling the wound edges are well approximated there is no cellulitis no drainage no signs of infection no dehiscence.  Imaging: No results found.   Labs: Lab Results  Component Value Date   HGBA1C 8.0 (H) 05/24/2018   HGBA1C 9.1 (H) 02/01/2018   HGBA1C 11.8 (H) 02/02/2017   ESRSEDRATE 63 (H) 12/13/2017   ESRSEDRATE 72 (H) 09/02/2017   ESRSEDRATE 34 (H) 02/02/2017   CRP 3.5 (H) 09/02/2017   REPTSTATUS 04/30/2018 FINAL 04/25/2018   GRAMSTAIN  04/25/2018    FEW WBC PRESENT,BOTH PMN AND MONONUCLEAR FEW GRAM POSITIVE COCCI RARE GRAM POSITIVE RODS Performed at [facility], [place]  [address], [place], [place] [address]    CULT  04/25/2018    ABUNDANT STAPHYLOCOCCUS AUREUS ABUNDANT KLEBSIELLA OXYTOCA    LABORGA KLEBSIELLA OXYTOCA 04/25/2018   LABORGA STAPHYLOCOCCUS AUREUS 04/25/2018     Lab Results  Component Value Date   ALBUMIN 2.1 (L) 09/07/2017   ALBUMIN 2.2 (L) 09/06/2017   ALBUMIN 2.1 (L) 09/05/2017   PREALBUMIN 15.3 (L) 09/02/2017    Body mass index is 23.63 kg/m.  Orders:  No orders of the defined types were placed in this encounter.  Meds ordered this encounter  Medications  . oxyCODONE-acetaminophen (PERCOCET) 10-325 MG tablet    Sig: Take 1 tablet by mouth every 8 (eight) hours as needed for pain.    Dispense:  40 tablet    Refill:  0     Procedures: No procedures performed  Clinical Data: No additional findings.  ROS:  All other systems negative, except as noted in the HPI. Review of Systems  Objective: Vital Signs: Ht 5\' 9"  (1.753 m)   Wt 160 lb (72.6 kg)   BMI 23.63 kg/m   Specialty Comments:  No specialty comments available.  PMFS History: Patient Active Problem List   Diagnosis Date Noted  . Hx of BKA, left ([place]) 05/24/2018  . History of left below knee amputation ([place]) 02/01/2018  . Gangrene of left  foot ([place])   . Dehiscence of amputation stump ([place]) 01/26/2018  . Surgical wound, non healing 09/02/2017  . Bipolar 1 disorder ([place]) 09/02/2017  . Nausea vomiting and diarrhea 08/11/2017  . Great toe pain, left 08/11/2017  . GERD (gastroesophageal reflux disease) 08/10/2017  . Anxiety 08/10/2017  . Edema   . Cellulitis   . AKI (acute kidney injury) ([place]) 02/04/2017  . Type II diabetes mellitus with renal manifestations, uncontrolled ([place]) 02/02/2017  . HLD (hyperlipidemia) 02/02/2017  . Tobacco abuse 02/02/2017  . Essential hypertension 02/02/2017  . Malnutrition of moderate degree 02/02/2017   Past Medical History:  Diagnosis Date  . Anxiety   . Asthma   . Bipolar affective ([place])   . Complete miscarriage   .  Constipation   . 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 Depression   . Diabetes mellitus    Type II  . Dyspnea     " only at night and I stop breathing in my sleep too for about the last three weeks"  . GERD (gastroesophageal reflux disease)   . Headache    "migraraines"  . Hyperlipidemia   . Hypertension   . Osteomyelitis ([place])    left foot  . Pregnancy complication    HELP Syndrome  . Renal disorder   . Schizophrenia ([place])   . Stroke [facility])    " mild stroke", memory loss- approx 2017    Family History  Problem Relation Age of Onset  . Diabetes Mellitus II Mother   . Heart disease Mother   . Heart disease Father     Past Surgical History:  Procedure Laterality Date  . ABDOMINAL AORTOGRAM N/A 09/07/2017   Procedure: ABDOMINAL AORTOGRAM;  Surgeon: Waynetta Sandy, MD;  Location: [place] [facility];  Service: Cardiovascular;  Laterality: N/A;  . ABDOMINAL AORTOGRAM W/LOWER EXTREMITY N/A 02/03/2017   Procedure: Abdominal Aortogram w/Lower Extremity;  Surgeon: Waynetta Sandy, MD;  Location: [place] [facility];  Service: Cardiovascular;  Laterality: N/A;  . AMPUTATION Left 02/10/2017   Procedure: AMPUTATION TOES 3, 4 AND 5  LEFT FOOT;  Surgeon: Waynetta Sandy, MD;  Location: [place];  Service: Vascular;  Laterality: Left;  . AMPUTATION Left 12/21/2017   Procedure: LEFT FOOT 5TH RAY AMPUTATION;  Surgeon: Newt Minion, MD;  Location: [place];  Service: Orthopedics;  Laterality: Left;  . AMPUTATION Left 02/01/2018   Procedure: LEFT BELOW KNEE AMPUTATION;  Surgeon: Newt Minion, MD;  Location: [place];  Service: Orthopedics;  Laterality: Left;  . CESAREAN SECTION    . CHOLECYSTECTOMY    . LOWER EXTREMITY ANGIOGRAPHY Bilateral 09/07/2017   Procedure: Lower Extremity Angiography;  Surgeon: Waynetta Sandy, MD;  Location: [place] [facility];  Service: Cardiovascular;  Laterality: Bilateral;  . PERIPHERAL VASCULAR ATHERECTOMY   02/03/2017   Procedure: Peripheral Vascular Atherectomy;  Surgeon: Waynetta Sandy, MD;  Location: [place] [facility];  Service: Cardiovascular;;  . PERIPHERAL VASCULAR BALLOON ANGIOPLASTY  02/03/2017   Procedure: Peripheral Vascular Balloon Angioplasty;  Surgeon: Waynetta Sandy, MD;  Location: [place] [facility];  Service: Cardiovascular;;  . PERIPHERAL VASCULAR INTERVENTION Left 09/07/2017   Procedure: PERIPHERAL VASCULAR INTERVENTION;  Surgeon: Waynetta Sandy, MD;  Location: [facility] [facility];  Service: Cardiovascular;  Laterality: Left;  SFA/POPLITEAL  . STUMP REVISION Left 05/24/2018  . STUMP REVISION Left 05/24/2018   Procedure: REVISION LEFT BELOW KNEE AMPUTATION;  Surgeon: Newt Minion, MD;  Location: [place];  Service: Orthopedics;  Laterality: Left;  . TUBAL LIGATION     Social History   Occupational History  . Not on file  Tobacco Use  . Smoking status: Current Every Day Smoker    Packs/day: 1.00    Years: 28.00    Pack years: 28.00    Types: Cigarettes  . Smokeless tobacco: Never Used  Substance and Sexual Activity  . Alcohol use: No  . Drug use: No  . Sexual activity: Yes    Birth control/protection: None

## 2018-06-21 ENCOUNTER — Ambulatory Visit (INDEPENDENT_AMBULATORY_CARE_PROVIDER_SITE_OTHER): Payer: Medicaid Other | Admitting: Orthopedic Surgery

## 2018-06-25 ENCOUNTER — Emergency Department (HOSPITAL_COMMUNITY): Payer: Medicaid Other

## 2018-06-25 ENCOUNTER — Inpatient Hospital Stay (HOSPITAL_COMMUNITY)
Admission: EM | Admit: 2018-06-25 | Discharge: 2018-07-03 | DRG: 271 | Discharge status: Against Medical Advice | Payer: Medicaid Other | Attending: Internal Medicine | Admitting: Internal Medicine

## 2018-06-25 ENCOUNTER — Other Ambulatory Visit: Payer: Self-pay

## 2018-06-25 ENCOUNTER — Encounter (HOSPITAL_COMMUNITY): Payer: Self-pay | Admitting: Emergency Medicine

## 2018-06-25 DIAGNOSIS — R112 Nausea with vomiting, unspecified: Secondary | ICD-10-CM | POA: Diagnosis not present

## 2018-06-25 DIAGNOSIS — L0390 Cellulitis, unspecified: Secondary | ICD-10-CM | POA: Diagnosis present

## 2018-06-25 DIAGNOSIS — L03115 Cellulitis of right lower limb: Secondary | ICD-10-CM | POA: Diagnosis present

## 2018-06-25 DIAGNOSIS — F419 Anxiety disorder, unspecified: Secondary | ICD-10-CM | POA: Diagnosis present

## 2018-06-25 DIAGNOSIS — I10 Essential (primary) hypertension: Secondary | ICD-10-CM

## 2018-06-25 DIAGNOSIS — N189 Chronic kidney disease, unspecified: Secondary | ICD-10-CM | POA: Diagnosis not present

## 2018-06-25 DIAGNOSIS — Z89512 Acquired absence of left leg below knee: Secondary | ICD-10-CM

## 2018-06-25 DIAGNOSIS — R1013 Epigastric pain: Secondary | ICD-10-CM | POA: Diagnosis not present

## 2018-06-25 DIAGNOSIS — N920 Excessive and frequent menstruation with regular cycle: Secondary | ICD-10-CM

## 2018-06-25 DIAGNOSIS — I891 Lymphangitis: Secondary | ICD-10-CM

## 2018-06-25 DIAGNOSIS — Z5321 Procedure and treatment not carried out due to patient leaving prior to being seen by health care provider: Secondary | ICD-10-CM | POA: Diagnosis not present

## 2018-06-25 DIAGNOSIS — E1140 Type 2 diabetes mellitus with diabetic neuropathy, unspecified: Secondary | ICD-10-CM

## 2018-06-25 DIAGNOSIS — E785 Hyperlipidemia, unspecified: Secondary | ICD-10-CM | POA: Diagnosis present

## 2018-06-25 DIAGNOSIS — F17210 Nicotine dependence, cigarettes, uncomplicated: Secondary | ICD-10-CM | POA: Diagnosis present

## 2018-06-25 DIAGNOSIS — Z794 Long term (current) use of insulin: Secondary | ICD-10-CM

## 2018-06-25 DIAGNOSIS — E1122 Type 2 diabetes mellitus with diabetic chronic kidney disease: Secondary | ICD-10-CM | POA: Diagnosis present

## 2018-06-25 DIAGNOSIS — R339 Retention of urine, unspecified: Secondary | ICD-10-CM | POA: Diagnosis not present

## 2018-06-25 DIAGNOSIS — N183 Chronic kidney disease, stage 3 (moderate): Secondary | ICD-10-CM | POA: Diagnosis present

## 2018-06-25 DIAGNOSIS — Z886 Allergy status to analgesic agent status: Secondary | ICD-10-CM

## 2018-06-25 DIAGNOSIS — I129 Hypertensive chronic kidney disease with stage 1 through stage 4 chronic kidney disease, or unspecified chronic kidney disease: Secondary | ICD-10-CM | POA: Diagnosis present

## 2018-06-25 DIAGNOSIS — K219 Gastro-esophageal reflux disease without esophagitis: Secondary | ICD-10-CM

## 2018-06-25 DIAGNOSIS — R3915 Urgency of urination: Secondary | ICD-10-CM | POA: Diagnosis not present

## 2018-06-25 DIAGNOSIS — R39198 Other difficulties with micturition: Secondary | ICD-10-CM | POA: Diagnosis not present

## 2018-06-25 DIAGNOSIS — F329 Major depressive disorder, single episode, unspecified: Secondary | ICD-10-CM | POA: Diagnosis present

## 2018-06-25 DIAGNOSIS — R300 Dysuria: Secondary | ICD-10-CM

## 2018-06-25 DIAGNOSIS — Z8739 Personal history of other diseases of the musculoskeletal system and connective tissue: Secondary | ICD-10-CM

## 2018-06-25 DIAGNOSIS — I69311 Memory deficit following cerebral infarction: Secondary | ICD-10-CM | POA: Diagnosis not present

## 2018-06-25 DIAGNOSIS — J45909 Unspecified asthma, uncomplicated: Secondary | ICD-10-CM | POA: Diagnosis present

## 2018-06-25 DIAGNOSIS — I998 Other disorder of circulatory system: Secondary | ICD-10-CM | POA: Diagnosis present

## 2018-06-25 DIAGNOSIS — I70211 Atherosclerosis of native arteries of extremities with intermittent claudication, right leg: Secondary | ICD-10-CM | POA: Diagnosis not present

## 2018-06-25 DIAGNOSIS — R1310 Dysphagia, unspecified: Secondary | ICD-10-CM | POA: Diagnosis not present

## 2018-06-25 DIAGNOSIS — E1151 Type 2 diabetes mellitus with diabetic peripheral angiopathy without gangrene: Principal | ICD-10-CM | POA: Diagnosis present

## 2018-06-25 DIAGNOSIS — N179 Acute kidney failure, unspecified: Secondary | ICD-10-CM

## 2018-06-25 DIAGNOSIS — M549 Dorsalgia, unspecified: Secondary | ICD-10-CM | POA: Diagnosis not present

## 2018-06-25 DIAGNOSIS — E1129 Type 2 diabetes mellitus with other diabetic kidney complication: Secondary | ICD-10-CM | POA: Diagnosis present

## 2018-06-25 DIAGNOSIS — Z720 Tobacco use: Secondary | ICD-10-CM | POA: Diagnosis present

## 2018-06-25 DIAGNOSIS — Z7951 Long term (current) use of inhaled steroids: Secondary | ICD-10-CM

## 2018-06-25 DIAGNOSIS — E1165 Type 2 diabetes mellitus with hyperglycemia: Secondary | ICD-10-CM | POA: Diagnosis present

## 2018-06-25 DIAGNOSIS — I70201 Unspecified atherosclerosis of native arteries of extremities, right leg: Secondary | ICD-10-CM | POA: Diagnosis present

## 2018-06-25 DIAGNOSIS — K5900 Constipation, unspecified: Secondary | ICD-10-CM | POA: Diagnosis present

## 2018-06-25 DIAGNOSIS — R109 Unspecified abdominal pain: Secondary | ICD-10-CM | POA: Diagnosis not present

## 2018-06-25 DIAGNOSIS — F209 Schizophrenia, unspecified: Secondary | ICD-10-CM | POA: Diagnosis present

## 2018-06-25 DIAGNOSIS — Z01810 Encounter for preprocedural cardiovascular examination: Secondary | ICD-10-CM | POA: Diagnosis not present

## 2018-06-25 DIAGNOSIS — IMO0002 Reserved for concepts with insufficient information to code with codable children: Secondary | ICD-10-CM | POA: Diagnosis present

## 2018-06-25 DIAGNOSIS — R630 Anorexia: Secondary | ICD-10-CM | POA: Diagnosis present

## 2018-06-25 DIAGNOSIS — E118 Type 2 diabetes mellitus with unspecified complications: Secondary | ICD-10-CM

## 2018-06-25 DIAGNOSIS — Z9862 Peripheral vascular angioplasty status: Secondary | ICD-10-CM | POA: Diagnosis not present

## 2018-06-25 DIAGNOSIS — Z885 Allergy status to narcotic agent status: Secondary | ICD-10-CM

## 2018-06-25 DIAGNOSIS — Z79899 Other long term (current) drug therapy: Secondary | ICD-10-CM

## 2018-06-25 DIAGNOSIS — Z6823 Body mass index (BMI) 23.0-23.9, adult: Secondary | ICD-10-CM

## 2018-06-25 DIAGNOSIS — T402X5A Adverse effect of other opioids, initial encounter: Secondary | ICD-10-CM | POA: Diagnosis not present

## 2018-06-25 DIAGNOSIS — Z888 Allergy status to other drugs, medicaments and biological substances status: Secondary | ICD-10-CM

## 2018-06-25 DIAGNOSIS — Z7902 Long term (current) use of antithrombotics/antiplatelets: Secondary | ICD-10-CM

## 2018-06-25 LAB — URINALYSIS, ROUTINE W REFLEX MICROSCOPIC
Bilirubin Urine: NEGATIVE
GLUCOSE, UA: 50 mg/dL — AB
KETONES UR: NEGATIVE mg/dL
LEUKOCYTES UA: NEGATIVE
Nitrite: NEGATIVE
Protein, ur: 100 mg/dL — AB
RBC / HPF: >50 RBC/hpf — ABNORMAL HIGH (ref 0–5)
Specific Gravity, Urine: 1.02 (ref 1.005–1.030)
pH: 5 (ref 5.0–8.0)

## 2018-06-25 LAB — CBC WITH DIFFERENTIAL/PLATELET
Abs Immature Granulocytes: 0.1 10*3/uL (ref 0.0–0.1)
Basophils Absolute: 0.1 10*3/uL (ref 0.0–0.1)
Basophils Relative: 1 %
Eosinophils Absolute: 0.6 10*3/uL (ref 0.0–0.7)
Eosinophils Relative: 4 %
HCT: 26.9 % — ABNORMAL LOW (ref 36.0–46.0)
Hemoglobin: 8.8 g/dL — ABNORMAL LOW (ref 12.0–15.0)
IMMATURE GRANULOCYTES: 1 %
Lymphocytes Relative: 21 %
Lymphs Abs: 3.3 10*3/uL (ref 0.7–4.0)
MCH: 30.3 pg (ref 26.0–34.0)
MCHC: 32.7 g/dL (ref 30.0–36.0)
MCV: 92.8 fL (ref 78.0–100.0)
MONOS PCT: 6 %
Monocytes Absolute: 0.9 10*3/uL (ref 0.1–1.0)
NEUTROS ABS: 10.6 10*3/uL — AB (ref 1.7–7.7)
NEUTROS PCT: 67 %
PLATELETS: 232 10*3/uL (ref 150–400)
RBC: 2.9 MIL/uL — ABNORMAL LOW (ref 3.87–5.11)
RDW: 12.7 % (ref 11.5–15.5)
WBC: 15.6 10*3/uL — ABNORMAL HIGH (ref 4.0–10.5)

## 2018-06-25 LAB — TYPE AND SCREEN
ABO/RH(D): A POS
ANTIBODY SCREEN: NEGATIVE

## 2018-06-25 LAB — COMPREHENSIVE METABOLIC PANEL
ALT: 6 U/L (ref 0–44)
AST: 11 U/L — ABNORMAL LOW (ref 15–41)
Albumin: 3 g/dL — ABNORMAL LOW (ref 3.5–5.0)
Alkaline Phosphatase: 75 U/L (ref 38–126)
Anion gap: 10 (ref 5–15)
BUN: 23 mg/dL — ABNORMAL HIGH (ref 6–20)
CO2: 23 mmol/L (ref 22–32)
Calcium: 8.6 mg/dL — ABNORMAL LOW (ref 8.9–10.3)
Chloride: 105 mmol/L (ref 98–111)
Creatinine, Ser: 2.25 mg/dL — ABNORMAL HIGH (ref 0.44–1.00)
GFR calc non Af Amer: 26 mL/min — ABNORMAL LOW (ref >60)
GFR, EST AFRICAN AMERICAN: 30 mL/min — AB (ref >60)
Glucose, Bld: 181 mg/dL — ABNORMAL HIGH (ref 70–99)
Potassium: 4.5 mmol/L (ref 3.5–5.1)
SODIUM: 138 mmol/L (ref 135–145)
Total Bilirubin: 0.7 mg/dL (ref 0.3–1.2)
Total Protein: 6.4 g/dL — ABNORMAL LOW (ref 6.5–8.1)

## 2018-06-25 LAB — PROTIME-INR
INR: 1.16
PROTHROMBIN TIME: 14.7 s (ref 11.4–15.2)

## 2018-06-25 LAB — I-STAT CG4 LACTIC ACID, ED
LACTIC ACID, VENOUS: 1.19 mmol/L (ref 0.5–1.9)
LACTIC ACID, VENOUS: 1.07 mmol/L (ref 0.5–1.9)

## 2018-06-25 LAB — GLUCOSE, CAPILLARY
GLUCOSE-CAPILLARY: 232 mg/dL — AB (ref 70–99)
Glucose-Capillary: 212 mg/dL — ABNORMAL HIGH (ref 70–99)

## 2018-06-25 LAB — HEPARIN LEVEL (UNFRACTIONATED)
HEPARIN UNFRACTIONATED: 0.16 [IU]/mL — AB (ref 0.30–0.70)
Heparin Unfractionated: 0.5 IU/mL (ref 0.30–0.70)

## 2018-06-25 LAB — I-STAT BETA HCG BLOOD, ED (MC, WL, AP ONLY): I-stat hCG, quantitative: <5 m[IU]/mL (ref <5)

## 2018-06-25 LAB — ABO/RH: ABO/RH(D): A POS

## 2018-06-25 LAB — CBG MONITORING, ED: Glucose-Capillary: 127 mg/dL — ABNORMAL HIGH (ref 70–99)

## 2018-06-25 MED ORDER — OXYCODONE-ACETAMINOPHEN 5-325 MG PO TABS
1.0000 | ORAL_TABLET | ORAL | Status: DC | PRN
  Administered 2018-06-25 – 2018-06-29 (×14): 1 via ORAL
  Filled 2018-06-25 (×15): qty 1

## 2018-06-25 MED ORDER — SODIUM CHLORIDE 0.9 % IV BOLUS
1000.0000 mL | Freq: Once | INTRAVENOUS | Status: AC
  Administered 2018-06-25: 1000 mL via INTRAVENOUS

## 2018-06-25 MED ORDER — HEPARIN BOLUS VIA INFUSION
4000.0000 [IU] | Freq: Once | INTRAVENOUS | Status: AC
  Administered 2018-06-25: 4000 [IU] via INTRAVENOUS
  Filled 2018-06-25: qty 4000

## 2018-06-25 MED ORDER — SIMVASTATIN 40 MG PO TABS
40.0000 mg | ORAL_TABLET | Freq: Every evening | ORAL | Status: DC
  Administered 2018-06-25: 40 mg via ORAL
  Filled 2018-06-25: qty 1

## 2018-06-25 MED ORDER — MOMETASONE FURO-FORMOTEROL FUM 100-5 MCG/ACT IN AERO
2.0000 | INHALATION_SPRAY | Freq: Two times a day (BID) | RESPIRATORY_TRACT | Status: DC
  Administered 2018-06-25 (×2): 2 via RESPIRATORY_TRACT
  Filled 2018-06-25 (×2): qty 8.8

## 2018-06-25 MED ORDER — CLOPIDOGREL BISULFATE 75 MG PO TABS: 75.0000 mg | ORAL_TABLET | Freq: Every day | ORAL | Status: DC

## 2018-06-25 MED ORDER — VANCOMYCIN HCL IN DEXTROSE 1-5 GM/200ML-% IV SOLN
1000.0000 mg | Freq: Once | INTRAVENOUS | Status: AC
  Administered 2018-06-25: 1000 mg via INTRAVENOUS
  Filled 2018-06-25: qty 200

## 2018-06-25 MED ORDER — INSULIN ASPART 100 UNIT/ML ~~LOC~~ SOLN
0.0000 [IU] | Freq: Three times a day (TID) | SUBCUTANEOUS | Status: DC
  Administered 2018-06-25 (×2): 5 [IU] via SUBCUTANEOUS
  Administered 2018-06-26: 11 [IU] via SUBCUTANEOUS
  Administered 2018-06-26 – 2018-06-27 (×3): 5 [IU] via SUBCUTANEOUS

## 2018-06-25 MED ORDER — CLONIDINE HCL 0.1 MG PO TABS
0.1000 mg | ORAL_TABLET | Freq: Two times a day (BID) | ORAL | Status: DC
  Administered 2018-06-25 – 2018-07-03 (×16): 0.1 mg via ORAL
  Filled 2018-06-25 (×16): qty 1

## 2018-06-25 MED ORDER — INSULIN ASPART 100 UNIT/ML ~~LOC~~ SOLN
0.0000 [IU] | Freq: Every day | SUBCUTANEOUS | Status: DC
  Administered 2018-06-26: 4 [IU] via SUBCUTANEOUS

## 2018-06-25 MED ORDER — PANTOPRAZOLE SODIUM 40 MG PO TBEC
40.0000 mg | DELAYED_RELEASE_TABLET | Freq: Every day | ORAL | Status: DC
  Administered 2018-06-25 – 2018-07-03 (×9): 40 mg via ORAL
  Filled 2018-06-25 (×9): qty 1

## 2018-06-25 MED ORDER — VANCOMYCIN HCL IN DEXTROSE 1-5 GM/200ML-% IV SOLN
1000.0000 mg | INTRAVENOUS | Status: DC
  Administered 2018-06-26 – 2018-06-29 (×4): 1000 mg via INTRAVENOUS
  Filled 2018-06-25 (×4): qty 200

## 2018-06-25 MED ORDER — ALPRAZOLAM 0.5 MG PO TABS
2.0000 mg | ORAL_TABLET | Freq: Four times a day (QID) | ORAL | Status: DC | PRN
  Administered 2018-06-25 – 2018-07-01 (×3): 2 mg via ORAL
  Filled 2018-06-25 (×3): qty 4

## 2018-06-25 MED ORDER — ALBUTEROL SULFATE (2.5 MG/3ML) 0.083% IN NEBU: 2.5000 mg | INHALATION_SOLUTION | RESPIRATORY_TRACT | Status: DC | PRN

## 2018-06-25 MED ORDER — INSULIN GLARGINE 100 UNIT/ML ~~LOC~~ SOLN
25.0000 [IU] | Freq: Every day | SUBCUTANEOUS | Status: DC
  Administered 2018-06-26 (×2): 25 [IU] via SUBCUTANEOUS
  Filled 2018-06-25 (×2): qty 0.25

## 2018-06-25 MED ORDER — FENTANYL CITRATE (PF) 100 MCG/2ML IJ SOLN
50.0000 ug | Freq: Once | INTRAMUSCULAR | Status: AC
  Administered 2018-06-25: 50 ug via INTRAVENOUS
  Filled 2018-06-25: qty 2

## 2018-06-25 MED ORDER — PIPERACILLIN-TAZOBACTAM 3.375 G IVPB
3.3750 g | Freq: Three times a day (TID) | INTRAVENOUS | Status: DC
  Administered 2018-06-25 – 2018-06-27 (×7): 3.375 g via INTRAVENOUS
  Filled 2018-06-25 (×8): qty 50

## 2018-06-25 MED ORDER — GABAPENTIN 400 MG PO CAPS
800.0000 mg | ORAL_CAPSULE | Freq: Two times a day (BID) | ORAL | Status: DC
  Administered 2018-06-25 – 2018-07-03 (×16): 800 mg via ORAL
  Filled 2018-06-25 (×17): qty 2

## 2018-06-25 MED ORDER — SENNOSIDES-DOCUSATE SODIUM 8.6-50 MG PO TABS: 1.0000 | ORAL_TABLET | Freq: Every evening | ORAL | Status: DC | PRN

## 2018-06-25 MED ORDER — MIRTAZAPINE 15 MG PO TABS
15.0000 mg | ORAL_TABLET | Freq: Every day | ORAL | Status: DC
  Administered 2018-06-25 – 2018-07-02 (×8): 15 mg via ORAL
  Filled 2018-06-25 (×8): qty 1

## 2018-06-25 MED ORDER — HEPARIN (PORCINE) IN NACL 100-0.45 UNIT/ML-% IJ SOLN
1700.0000 [IU]/h | INTRAMUSCULAR | Status: DC
  Administered 2018-06-25: 1150 [IU]/h via INTRAVENOUS
  Administered 2018-06-25 – 2018-06-29 (×5): 1400 [IU]/h via INTRAVENOUS
  Administered 2018-06-30: 1500 [IU]/h via INTRAVENOUS
  Administered 2018-06-30: 1400 [IU]/h via INTRAVENOUS
  Administered 2018-07-01: 1500 [IU]/h via INTRAVENOUS
  Administered 2018-07-02: 1700 [IU]/h via INTRAVENOUS
  Administered 2018-07-02: 1500 [IU]/h via INTRAVENOUS
  Filled 2018-06-25 (×13): qty 250

## 2018-06-25 MED ORDER — NICOTINE 21 MG/24HR TD PT24
21.0000 mg | MEDICATED_PATCH | Freq: Every day | TRANSDERMAL | Status: DC
  Administered 2018-06-25 – 2018-07-03 (×8): 21 mg via TRANSDERMAL
  Filled 2018-06-25 (×9): qty 1

## 2018-06-25 NOTE — Progress Notes (Signed)
Pharmacy Antibiotic Note  Kathy Phelps is a 37 y.o. female admitted on 06/25/2018 with R foot infection.  Pharmacy has been consulted for Vancomycin and Zosyn dosing.  Vancomycin 1 g IV given in ED at [facility]: Vancomycin 1 g IV q24h Zosyn 3.375 g IV q8h   Height: 5\' 9"  (175.3 cm) Weight: 160 lb 0.9 oz (72.6 kg) IBW/kg (Calculated) : 66.2  Temp (24hrs), Avg:98.4 F (36.9 C), Min:98.4 F (36.9 C), Max:98.4 F (36.9 C)  Recent Labs  Lab 06/25/18 0129 06/25/18 0149 06/25/18 0433  WBC 15.6*  --   --   CREATININE 2.25*  --   --   LATICACIDVEN  --  1.19 1.07    Estimated Creatinine Clearance: 35.1 mL/min (A) (by C-G formula based on SCr of 2.25 mg/dL (H)).    Allergies  Allergen Reactions  . Hydrocodone-Acetaminophen Hives  . Tylenol [Acetaminophen] Itching and Swelling    Caryl Pina 06/25/2018 6:59 AM

## 2018-06-25 NOTE — Consult Note (Signed)
[facility] [place]  Patient name: Kathy Phelps MRN: [id number] DOB: [birth year] Sex: female  Reason for consult: Question ischemia to right foot  HPI: Kathy Phelps is a 37 y.o. female with a very complex past medical history.  She has poorly controlled diabetes and does have a history of renal insufficiency.  She had developed ulcerations on her left foot and had toe amputations with nonhealing.  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  She had continued nonhealing and underwent below-knee amputation with Dr. Sharol Given.  This failed to heal and she is recently had a revision of this which appears to be healing.  She reports that for the past several weeks she has been having more numbness than typically in her right foot.  She resorts presents to the emergency room with redness and pain this evening.  She also is reporting left flank pain and feels as though she has a urinary tract infection.  She has had more heavy vaginal bleeding than recently as well.  Past Medical History:  Diagnosis Date  . Anxiety   . Asthma   . Bipolar affective ([place])   . Complete miscarriage   . Constipation   . 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 Depression   . Diabetes mellitus    Type II  . Dyspnea     " only at night and I stop breathing in my sleep too for about the last three weeks"  . GERD (gastroesophageal reflux disease)   . Headache    "migraraines"  . Hyperlipidemia   . Hypertension   . Osteomyelitis ([place])    left foot  . Pregnancy complication    HELP Syndrome  . Renal disorder   . Schizophrenia ([place])   . Stroke [facility])    " mild stroke", memory loss- approx 2017    Family History  Problem Relation Age of Onset  . Diabetes Mellitus II Mother   . Heart disease Mother   . Heart disease Father     SOCIAL HISTORY: Social History    Tobacco Use  . Smoking status: Current Every Day Smoker    Packs/day: 1.00    Years: 28.00    Pack years: 28.00    Types: Cigarettes  . Smokeless tobacco: Never Used  Substance Use Topics  . Alcohol use: No    Allergies  Allergen Reactions  . Hydrocodone-Acetaminophen Hives  . Tylenol [Acetaminophen] Itching and Swelling    Current Facility-Administered Medications  Medication Dose Route Frequency Provider Last Rate Last Dose  . heparin ADULT infusion 100 units/mL (25000 units/22mL sodium chloride 0.45%)  1,150 Units/hr Intravenous Continuous Rancour, Stephen, MD 11.5 mL/hr at 06/25/18 0503 1,150 Units/hr at 06/25/18 0503  . piperacillin-tazobactam (ZOSYN) IVPB 3.375 g  3.375 g Intravenous Q8H Rancour, Stephen, MD 12.5 mL/hr at 06/25/18 0609 3.375 g at 06/25/18 0609  . sodium chloride 0.9 % bolus 1,000 mL  1,000 mL Intravenous Once Rancour, Annie Main, MD 983.6 mL/hr at 06/25/18 0550 1,000 mL at 06/25/18 0550  . vancomycin (VANCOCIN) IVPB 1000 mg/200 mL premix  1,000 mg Intravenous Once Rancour, Annie Main, MD       Current Outpatient Medications  Medication Sig Dispense Refill  . ADVAIR DISKUS 100-50 MCG/DOSE AEPB Inhale 1 puff into the lungs daily.  5  . alprazolam (XANAX) 2 MG tablet Take 1 tablet (2 mg total) by mouth 4 (  four) times daily as needed for sleep. (Patient taking differently: Take 2 mg by mouth 5 (five) times daily. ) 30 tablet 0  . busPIRone (BUSPAR) 15 MG tablet Take 15 mg by mouth 3 (three) times daily.  1  . cloNIDine (CATAPRES) 0.1 MG tablet Take 0.1 mg by mouth 2 (two) times daily.  0  . clopidogrel (PLAVIX) 75 MG tablet Take 1 tablet (75 mg total) by mouth daily with breakfast. 30 tablet 3  . DULoxetine (CYMBALTA) 30 MG capsule Take 90 mg by mouth every morning.  1  . famotidine (PEPCID) 20 MG tablet Take 1 tablet (20 mg total) by mouth 2 (two) times daily. (Patient taking differently: Take 20 mg by mouth daily. ) 60 tablet 0  . gabapentin (NEURONTIN) 800 MG  tablet Take 1 tablet (800 mg total) by mouth 2 (two) times daily. (Patient taking differently: Take 800 mg by mouth 5 (five) times daily. ) 60 tablet 0  . insulin glargine (LANTUS) 100 UNIT/ML injection Inject 0.2-0.3 mLs (20-30 Units total) into the skin 2 (two) times daily. 20 U in the morning and 30 U in the evening (Patient taking differently: Inject 30 Units into the skin 2 (two) times daily. ) 2 vial 0  . LATUDA 120 MG TABS Take 120 mg by mouth at bedtime.  1  . metFORMIN (GLUCOPHAGE) 1000 MG tablet Take 1 tablet (1,000 mg total) by mouth 2 (two) times daily. (Patient taking differently: Take 500 mg by mouth 2 (two) times daily with a meal. ) 30 tablet 0  . metoprolol tartrate (LOPRESSOR) 25 MG tablet Take 0.5 tablets (12.5 mg total) by mouth 2 (two) times daily. 60 tablet 0  . mirtazapine (REMERON) 15 MG tablet Take 15 mg by mouth at bedtime.  0  . naproxen (NAPROSYN) 500 MG tablet Take 500 mg by mouth 2 (two) times daily.  0  . NOVOLOG 100 UNIT/ML injection Inject 0-30 Units into the skin 3 (three) times daily with meals. Sliding Scale:  >300 15 units < 300 do not use 10 mL 3  . pantoprazole (PROTONIX) 40 MG tablet Take 1 tablet (40 mg total) by mouth daily. 30 tablet 0  . PROAIR HFA 108 (90 BASE) MCG/ACT inhaler Inhale 2 puffs into the lungs every 6 (six) hours as needed for shortness of breath.   0  . silver sulfADIAZINE (SILVADENE) 1 % cream Apply 1 application topically daily. 50 g 0  . simvastatin (ZOCOR) 40 MG tablet Take 40 mg by mouth every evening.   2  . zolpidem (AMBIEN) 10 MG tablet Take 10 mg by mouth at bedtime as needed for sleep.  2  . Insulin Syringe-Needle U-100 (INSULIN SYRINGE .5CC/31GX5/16") 31G X 5/16" 0.5 ML MISC 1 each by Does not apply route QID. 100 each 1  . metoCLOPramide (REGLAN) 5 MG tablet Take 1 tablet (5 mg total) by mouth 3 (three) times daily before meals. (Patient not taking: Reported on 06/25/2018) 90 tablet 0  . nicotine (NICODERM CQ - DOSED IN MG/24 HOURS)  21 mg/24hr patch Place 1 patch (21 mg total) onto the skin daily. (Patient not taking: Reported on 06/25/2018) 28 patch 0  . nitroGLYCERIN (NITRO-DUR) 0.2 mg/hr patch Place 1 patch (0.2 mg total) onto the skin daily. (Patient not taking: Reported on 06/25/2018) 30 patch 12  . oxyCODONE-acetaminophen (PERCOCET) 10-325 MG tablet Take 1 tablet by mouth every 6 (six) hours as needed for pain. (Patient not taking: Reported on 06/25/2018) 30 tablet 0  .  oxyCODONE-acetaminophen (PERCOCET) 10-325 MG tablet Take 1 tablet by mouth every 8 (eight) hours as needed for pain. (Patient not taking: Reported on 06/25/2018) 40 tablet 0  . pentoxifylline (TRENTAL) 400 MG CR tablet Take 1 tablet (400 mg total) by mouth 3 (three) times daily with meals. (Patient not taking: Reported on 06/25/2018) 90 tablet 1    REVIEW OF SYSTEMS:  [X]  denotes positive finding, [ ]  denotes negative finding Cardiac  Comments:  Chest pain or chest pressure:    Shortness of breath upon exertion:    Short of breath when lying flat:    Irregular heart rhythm:        Vascular    Pain in calf, thigh, or hip brought on by ambulation:    Pain in feet at night that wakes you up from your sleep:  x   Blood clot in your veins:    Leg swelling:           PHYSICAL EXAM: Vitals:   06/25/18 0536 06/25/18 0537 06/25/18 0538 06/25/18 0604  BP:    (!) 157/105  Pulse: 77 76 77 77  Resp:      Temp:      TempSrc:      SpO2: 100% 100% 100% 100%  Weight:      Height:        GENERAL: The patient is a well-nourished female, in no acute distress. The vital signs are documented above. CARDIOVASCULAR: She has easily palpable femoral pulses bilaterally.  I do not palpate a right popliteal or pedal pulses.  She has Doppler flow in the posterior tibial and peroneal arteries on the right. PULMONARY: There is good air exchange  MUSCULOSKELETAL: There are no major deformities or cyanosis. NEUROLOGIC: No focal weakness or paresthesias are  detected. SKIN: Her left below-knee amputation appears to be healing.  On the right she has red streaking on her pretibial area and some redness on the dorsum of her foot.  There is no fluctuance.  She does have some discoloration on the tips of her toes PSYCHIATRIC: The patient has a normal affect.  DATA:  Her creatinine is 2.  25  I reviewed her most recent arteriogram from November 2018.  At that time she had no evidence of iliac or SFA disease on the right.  She did have some narrowing on a high takeoff of her posterior tibial artery origin and did have occlusion of her anterior tibial.  She had a dominant peroneal artery to her ankle.  MEDICAL ISSUES: Difficult situation with multiple medical problems and uncontrolled diabetes.  Continues to smoke.  Nonhealing of multiple amputation revisions on the left leg.  I do not feel that she has any acute change with acute ischemia.  I am concerned that she may have progressed to stenosis or occlusion of her right superficial femoral artery since I do not palpate a right popliteal pulse.  I feel she needs inpatient hospitalization for IV antibiotics for control of the apparent lymphangitis in her right foot and for hydration.  Will need limited arteriogram to determine if revascularization is possible with either endovascular means or with surgical bypass.  Discussed this at length with the patient.  Explained that this is obviously putting her at risk for right leg amputation over time as well.    Rosetta Posner, MD FACS [facility] Tel [phone] Pager [phone]

## 2018-06-25 NOTE — ED Notes (Signed)
Patient transported to CT/ultrasound

## 2018-06-25 NOTE — ED Notes (Signed)
Admitting team at bedside.

## 2018-06-25 NOTE — ED Triage Notes (Signed)
Reports right leg having red discoloration to top of foot that's going up leg and toes are purple in color.  Hx of amputation of left leg 3 weeks ago and thinks its possibly infected.  Also reports having pain with urination and left kidney hurts.

## 2018-06-25 NOTE — Plan of Care (Signed)
  Problem: Nutrition: Goal: Adequate nutrition will be maintained Outcome: Progressing   Problem: Elimination: Goal: Will not experience complications related to bowel motility Outcome: Progressing   Problem: Safety: Goal: Ability to remain free from injury will improve Outcome: Progressing   Problem: Skin Integrity: Goal: Risk for impaired skin integrity will decrease Outcome: Progressing   

## 2018-06-25 NOTE — H&P (Addendum)
Date: 06/25/2018               Patient Name:  Kathy Phelps MRN: [id number]  DOB: [birth year] Age / Sex: 37 y.o., female   PCP: Patient, No Pcp Per         Medical Service: Internal Medicine Teaching Service         Attending Physician: Dr. Bartholomew Crews, MD    First Contact: [name], OMS-IV Pager: [phone]  Second Contact: Dr. [name] Pager: [phone]       After Hours (After 5p/  First Contact Pager: [phone]  weekends / holidays): Second Contact Pager: [phone]   Chief Complaint:  RLE Pain & Erythema  History of Present Illness:  Kathy Phelps is a 37 y.o Caucasian Female with a PMHx of L foot osteomyelitis & gangrene s/p L BKA on 02/01/18 by Dr. Sharol Given w/ a subsequent dehiscence revision on 05/25/18, 1 ppd smoker for the past 50yrs, HTN, dyslipidemia, & poorly controlled T2DM who reports experiencing a purple discoloration to her R toes for the past 2 weeks and then a 2 day Hx of burning pain rated as an 8/10 & erythema to her RLE from the anterior mid shin down to the dorsum of her foot. In addition to the RLE pain & burning, she also complains of a 2 day Hx of dysuria & intermittent L flank pain radiating to her groin, and menorrhagia w/ dark red penny-sized clots requiring 5 tampons & 5 pads. She reports she was on an Plavix, but stopped 4 months ago per her doctors recommendation. Aside from these main complaints, she endorses subjective fever, chills, weakness most pronounced in her RLE, numbness in her RLE and watery diarrhea. She denies any new onset HA, dizziness, chest pain, shortness of breath, cough, nausea, vomiting, abdominal pain or further complaints.  Here in the ED, her vitals remain stable aside from being hypertensive in the 762-263F systolic. A doppler of her RLE revealed a PT pulse and non contrast CT showed no kidney stones or other obstructions that could cause her flank pain. A UA was also done which revealed hematuria w/o any signs of  infection. She was admitted to our service for further evaluation of her RLE ischemia & lymphangitis.  Meds:  Current Meds  Medication Sig  . ADVAIR DISKUS 100-50 MCG/DOSE AEPB Inhale 1 puff into the lungs daily.  Marland Kitchen alprazolam (XANAX) 2 MG tablet Take 1 tablet (2 mg total) by mouth 4 (four) times daily as needed for sleep. (Patient taking differently: Take 2 mg by mouth 5 (five) times daily. )  . busPIRone (BUSPAR) 15 MG tablet Take 15 mg by mouth 3 (three) times daily.  . cloNIDine (CATAPRES) 0.1 MG tablet Take 0.1 mg by mouth 2 (two) times daily.  . clopidogrel (PLAVIX) 75 MG tablet Take 1 tablet (75 mg total) by mouth daily with breakfast.  . DULoxetine (CYMBALTA) 30 MG capsule Take 90 mg by mouth every morning.  . famotidine (PEPCID) 20 MG tablet Take 1 tablet (20 mg total) by mouth 2 (two) times daily. (Patient taking differently: Take 20 mg by mouth daily. )  . gabapentin (NEURONTIN) 800 MG tablet Take 1 tablet (800 mg total) by mouth 2 (two) times daily. (Patient taking differently: Take 800 mg by mouth 5 (five) times daily. )  . insulin glargine (LANTUS) 100 UNIT/ML injection Inject 0.2-0.3 mLs (20-30 Units total) into the skin 2 (two) times daily. 20 U in the morning and  30 U in the evening (Patient taking differently: Inject 30 Units into the skin 2 (two) times daily. )  . LATUDA 120 MG TABS Take 120 mg by mouth at bedtime.  . metFORMIN (GLUCOPHAGE) 1000 MG tablet Take 1 tablet (1,000 mg total) by mouth 2 (two) times daily. (Patient taking differently: Take 500 mg by mouth 2 (two) times daily with a meal. )  . metoprolol tartrate (LOPRESSOR) 25 MG tablet Take 0.5 tablets (12.5 mg total) by mouth 2 (two) times daily.  . mirtazapine (REMERON) 15 MG tablet Take 15 mg by mouth at bedtime.  . naproxen (NAPROSYN) 500 MG tablet Take 500 mg by mouth 2 (two) times daily.  Marland Kitchen NOVOLOG 100 UNIT/ML injection Inject 0-30 Units into the skin 3 (three) times daily with meals. Sliding Scale:  >300 15  units < 300 do not use  . pantoprazole (PROTONIX) 40 MG tablet Take 1 tablet (40 mg total) by mouth daily.  Marland Kitchen PROAIR HFA 108 (90 BASE) MCG/ACT inhaler Inhale 2 puffs into the lungs every 6 (six) hours as needed for shortness of breath.   . silver sulfADIAZINE (SILVADENE) 1 % cream Apply 1 application topically daily.  . simvastatin (ZOCOR) 40 MG tablet Take 40 mg by mouth every evening.   . zolpidem (AMBIEN) 10 MG tablet Take 10 mg by mouth at bedtime as needed for sleep.     Allergies: Allergies as of 06/25/2018 - Review Complete 06/25/2018  Allergen Reaction Noted  . Hydrocodone-acetaminophen Hives 07/16/2014  . Tylenol [acetaminophen] Itching and Swelling 02/27/2015   Past Medical History:  Diagnosis Date  . Anxiety   . Asthma   . Bipolar affective ([place])   . Complete miscarriage   . Constipation   . 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 Depression   . Diabetes mellitus    Type II  . Dyspnea     " only at night and I stop breathing in my sleep too for about the last three weeks"  . GERD (gastroesophageal reflux disease)   . Headache    "migraraines"  . Hyperlipidemia   . Hypertension   . Osteomyelitis ([place])    left foot  . Pregnancy complication    HELP Syndrome  . Renal disorder   . Schizophrenia ([place])   . Stroke [facility])    " mild stroke", memory loss- approx 2017    Family History:   Social History:  She lives at home with her 2 daughters and is currently on disability. She smokes 1 ppd for the past 20yrs, but denies any drug or alcohol usage  Review of Systems: A complete ROS was negative except as per HPI.  Physical Exam: Blood pressure (!) 164/98, pulse 78, temperature 98.4 F (36.9 C), temperature source Oral, resp. rate 14, height 5\' 9"  (1.753 m), weight 72.6 kg, SpO2 99 %. Physical Exam  Constitutional: She is oriented to person, place, and time.  Caucasian female who is cooperative with our questions  HENT:  Head: Normocephalic  and atraumatic.  Right Ear: External ear normal.  Left Ear: External ear normal.  Eyes: Conjunctivae and EOM are normal.  Neck: Normal range of motion. Neck supple.  Cardiovascular: Normal rate, regular rhythm and normal heart sounds. Exam reveals no gallop and no friction rub.  No murmur heard. Pulses:      Radial pulses are 2+ on the right side, and 2+ on the left side.       Popliteal pulses are  0 on the right side.       Dorsalis pedis pulses are 0 on the right side.       Posterior tibial pulses are 2+ on the right side.  Pulmonary/Chest: Effort normal and breath sounds normal. No respiratory distress. She has no wheezes. She has no rales. She exhibits no tenderness.  Abdominal: Soft. Bowel sounds are normal. She exhibits distension (secondary to body habitus). There is no tenderness. There is no rebound.  Musculoskeletal:  L BKA w/ sutures noted but without any erythema, edema or drainage. RLE with purple discoloration to the toes and streaking erythema from the dorsum of the foot to the anterior mid shin. Severe tenderness to palpation to the RLE localized superior to the malleolus. R foot cool to touch. Moderate L CVA tenderness to palpation  Neurological: She is alert and oriented to person, place, and time.  Decrease sensation to the RLE extending down to the malleolus with absent sensation in the R foot    Assessment & Plan by Problem: Active Problems:   Lower limb ischemia  This is a 37 y.o Caucasian Female w/ a PMHx of uncontrolled T2DM, 25 pack year 1ppd smoker, HTN & dyslipidemia presenting w/ RLE ischemia for the past 2 days with associated lymphangitis & cellulitis. Currently hospital day 1.  RLE ischemia with Lymphangitis & Cellulitis. Her most recent arteriogram of her RLE on 11/18 revealed narrowing on a high takeoff of her posterior tibial artery and occlusion of her anterior tibial artery. Vascular surgery (Dr Early) saw the pt in the ED and b/c he was unable to  palpate the R popliteal pulse, he is concerned there may be an occlusion or stenosis of the R superficial femoral artery so he will perform an arteriogram of her RLE tomorrow to determine if revascularization is possible. She was given a 1 L bolus of NS and 1 dose of Fentanyl in the ED; however, she stated to [place] that it did not manage her pain and insisted that only Dilaudid provided pain relief. We informed her that we would instead proceed w/ Percocet. - Await RLE arteriogram tomorrow by vascular sg - Start Heparin 4000 units IV bolus followed by 1150 units/hr - Recheck heparin levels in 8 hrs per pharmacy recommendations - Appreciate pharmacy recommendations so will start IV Vanc 1g qd & Zosyn 3.375g q8h for broad base coverage - Start Oxycodone-Acetaminophen (Percocet) 1 tab 5-325mg  q4h PRN for pain control - Admit her to cardiac monitoring with daily CBC's  Essential HTN. Pt hypertensive since admission but does not complain of any worsening HA compared to her usual pain. - Start home Clonidine 0.1mg  tablet BID - Continue to monitor BP  Acute Menorrhagia. Pt reports her LMP was on 06/13/18 and her current bleeding which required 5 tampons & 5 pads yesterday is much worse compared to her normal menstrual bleeding which usually only requires 8 pads for the entire duration. Her HgB is still stable at 8.8. She was informed that her current menorrhagia will be exacerbated by the heparin which we plan on starting per vascular sg recommendations to salvage her limb ischemia in which she was agreeable with. - Continue to monitor CBC  Uncontrolled T2DM, insulin dependent, w/ neuropathy. She reports she does not check her blood sugars frequently and due to her infrequent meals (her last meal was 4 days w/ minimal fluid intake), her blood sugar fluctuates anywhere from the low 100s - 500s. She takes metformin, lantus 30 units BID & Novolog TID -  Will d/c home metformin - Start Lantus 25 units qpm - Start  SSI - Check daily blood sugars - Start Neurontin 800mg  capsule BID  Hx of Dyslipidemia. - Start home Simvastatin 40mg  1 tab qd   Hx of Tobacco Dependence. Pt reports she has decreased her amount of cigarettes from 1.5ppd to her current 1ppd. - Start Nicotine patch qd  Hx of Anxiety. - Start home Xanax 2mg  1 tab qid PRN - Start home Remeron 15mg  1 tab qpm  Hx of Asthma. Pt denies any worsening SOB compared to her baseline dyspnea - Start Albuterol 2.mg q4hr PRN  - Start Mometasone-Formoterol 2 puff BID  Hx of GERD. Pt denies any current signs of reflux - Start home Protonix 40mg  qd PO  Hx of Constipation. Pt reports that she normally goes to the bathroom once a day but she only had 1 bowel movement in the past 2 days. She did not exhibit any abdominal tenderness in our evaluation or abnormal bowel sounds. - Start Senokot-S 1 tab PO PRN  Diet: Regular Code: Modified Carb DVT Prophylaxis: Heparin  Dispo: Admit patient to Inpatient with expected length of stay greater than 2 midnights.  Signed: [name], Medical Student 06/25/2018, 9:07 AM  Pager: [phone]  Attestation for Student Documentation:  I personally was present and performed or re-performed the history, physical exam and medical decision-making activities of this service and have verified that the service and findings are accurately documented in the student's note with some additions/corrections.  [name], MD 06/25/2018, 2:17 PM

## 2018-06-25 NOTE — Progress Notes (Signed)
ANTICOAGULATION CONSULT NOTE - Initial Consult  Pharmacy Consult for Heparin Indication: ischemic limb  Allergies  Allergen Reactions  . Hydrocodone-Acetaminophen Hives  . Tylenol [Acetaminophen] Itching and Swelling    Patient Measurements: Height: 5\' 9"  (175.3 cm) Weight: 160 lb 0.9 oz (72.6 kg) IBW/kg (Calculated) : 66.2  Vital Signs: Temp: 98.4 F (36.9 C) (09/15 0925) Temp Source: Oral (09/15 0925) BP: 181/107 (09/15 0925) Pulse Rate: 78 (09/15 0925)  Labs: Recent Labs    06/25/18 0129 06/25/18 0557 06/25/18 1213  HGB 8.8*  --   --   HCT 26.9*  --   --   PLT 232  --   --   LABPROT  --  14.7  --   INR  --  1.16  --   HEPARINUNFRC  --   --  0.16*  CREATININE 2.25*  --   --     Estimated Creatinine Clearance: 35.1 mL/min (A) (by C-G formula based on SCr of 2.25 mg/dL (H)).   Medical History: Past Medical History:  Diagnosis Date  . Anxiety   . Asthma   . Bipolar affective ([place])   . Complete miscarriage   . Constipation   . 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 Depression   . Diabetes mellitus    Type II  . Dyspnea     " only at night and I stop breathing in my sleep too for about the last three weeks"  . GERD (gastroesophageal reflux disease)   . Headache    "migraraines"  . Hyperlipidemia   . Hypertension   . Osteomyelitis ([place])    left foot  . Pregnancy complication    HELP Syndrome  . Renal disorder   . Schizophrenia ([place])   . Stroke [facility])    " mild stroke", memory loss- approx 2017    Medications:  No current facility-administered medications on file prior to encounter.    Current Outpatient Medications on File Prior to Encounter  Medication Sig Dispense Refill  . ADVAIR DISKUS 100-50 MCG/DOSE AEPB Inhale 1 puff into the lungs daily.  5  . alprazolam (XANAX) 2 MG tablet Take 1 tablet (2 mg total) by mouth 4 (four) times daily as needed for sleep. (Patient taking differently: Take 2 mg by mouth 5 (five) times  daily. ) 30 tablet 0  . busPIRone (BUSPAR) 15 MG tablet Take 15 mg by mouth 3 (three) times daily.  1  . cloNIDine (CATAPRES) 0.1 MG tablet Take 0.1 mg by mouth 2 (two) times daily.  0  . clopidogrel (PLAVIX) 75 MG tablet Take 1 tablet (75 mg total) by mouth daily with breakfast. 30 tablet 3  . DULoxetine (CYMBALTA) 30 MG capsule Take 90 mg by mouth every morning.  1  . famotidine (PEPCID) 20 MG tablet Take 1 tablet (20 mg total) by mouth 2 (two) times daily. (Patient taking differently: Take 20 mg by mouth daily. ) 60 tablet 0  . gabapentin (NEURONTIN) 800 MG tablet Take 1 tablet (800 mg total) by mouth 2 (two) times daily. (Patient taking differently: Take 800 mg by mouth 5 (five) times daily. ) 60 tablet 0  . insulin glargine (LANTUS) 100 UNIT/ML injection Inject 0.2-0.3 mLs (20-30 Units total) into the skin 2 (two) times daily. 20 U in the morning and 30 U in the evening (Patient taking differently: Inject 30 Units into the skin 2 (two) times daily. ) 2 vial 0  . LATUDA 120 MG TABS Take  120 mg by mouth at bedtime.  1  . metFORMIN (GLUCOPHAGE) 1000 MG tablet Take 1 tablet (1,000 mg total) by mouth 2 (two) times daily. (Patient taking differently: Take 500 mg by mouth 2 (two) times daily with a meal. ) 30 tablet 0  . metoprolol tartrate (LOPRESSOR) 25 MG tablet Take 0.5 tablets (12.5 mg total) by mouth 2 (two) times daily. 60 tablet 0  . mirtazapine (REMERON) 15 MG tablet Take 15 mg by mouth at bedtime.  0  . naproxen (NAPROSYN) 500 MG tablet Take 500 mg by mouth 2 (two) times daily.  0  . NOVOLOG 100 UNIT/ML injection Inject 0-30 Units into the skin 3 (three) times daily with meals. Sliding Scale:  >300 15 units < 300 do not use 10 mL 3  . pantoprazole (PROTONIX) 40 MG tablet Take 1 tablet (40 mg total) by mouth daily. 30 tablet 0  . PROAIR HFA 108 (90 BASE) MCG/ACT inhaler Inhale 2 puffs into the lungs every 6 (six) hours as needed for shortness of breath.   0  . silver sulfADIAZINE (SILVADENE) 1  % cream Apply 1 application topically daily. 50 g 0  . simvastatin (ZOCOR) 40 MG tablet Take 40 mg by mouth every evening.   2  . zolpidem (AMBIEN) 10 MG tablet Take 10 mg by mouth at bedtime as needed for sleep.  2  . Insulin Syringe-Needle U-100 (INSULIN SYRINGE .5CC/31GX5/16") 31G X 5/16" 0.5 ML MISC 1 each by Does not apply route QID. 100 each 1  . metoCLOPramide (REGLAN) 5 MG tablet Take 1 tablet (5 mg total) by mouth 3 (three) times daily before meals. (Patient not taking: Reported on 06/25/2018) 90 tablet 0  . nicotine (NICODERM CQ - DOSED IN MG/24 HOURS) 21 mg/24hr patch Place 1 patch (21 mg total) onto the skin daily. (Patient not taking: Reported on 06/25/2018) 28 patch 0  . nitroGLYCERIN (NITRO-DUR) 0.2 mg/hr patch Place 1 patch (0.2 mg total) onto the skin daily. (Patient not taking: Reported on 06/25/2018) 30 patch 12  . oxyCODONE-acetaminophen (PERCOCET) 10-325 MG tablet Take 1 tablet by mouth every 6 (six) hours as needed for pain. (Patient not taking: Reported on 06/25/2018) 30 tablet 0  . oxyCODONE-acetaminophen (PERCOCET) 10-325 MG tablet Take 1 tablet by mouth every 8 (eight) hours as needed for pain. (Patient not taking: Reported on 06/25/2018) 40 tablet 0  . pentoxifylline (TRENTAL) 400 MG CR tablet Take 1 tablet (400 mg total) by mouth 3 (three) times daily with meals. (Patient not taking: Reported on 06/25/2018) 90 tablet 1     Assessment: 37 y.o. female with RLE ischemia for heparin  HL subtherapeutic at 0.16. H&H low stable. Nursing confirms no signs/symptoms of bleeding aside from some vaginal bleeding present on admission.    Goal of Therapy:  Heparin level 0.3-0.7 units/ml Monitor platelets by anticoagulation protocol: Yes   Plan:  Increase heparin 1400 units/hr Check heparin level in 8 hours Monitor daily HL, CBC, and signs/symptoms of bleeding   Isaias Sakai, Pharm D PGY1 Pharmacy Resident  Please use AMION for clinical pharmacists numbers  06/25/2018      1:37 PM

## 2018-06-25 NOTE — Progress Notes (Signed)
ANTICOAGULATION CONSULT NOTE - Initial Consult  Pharmacy Consult for Heparin Indication: ischemic limb  Allergies  Allergen Reactions  . Hydrocodone-Acetaminophen Hives  . Tylenol [Acetaminophen] Itching and Swelling    Patient Measurements: Height: 5\' 9"  (175.3 cm) Weight: 160 lb 0.9 oz (72.6 kg) IBW/kg (Calculated) : 66.2  Vital Signs: Temp: 98.4 F (36.9 C) (09/15 0118) Temp Source: Oral (09/15 0118) BP: 124/94 (09/15 0118) Pulse Rate: 85 (09/15 0118)  Labs: Recent Labs    06/25/18 0129  HGB 8.8*  HCT 26.9*  PLT 232  CREATININE 2.25*    Estimated Creatinine Clearance: 35.1 mL/min (A) (by C-G formula based on SCr of 2.25 mg/dL (H)).   Medical History: Past Medical History:  Diagnosis Date  . Anxiety   . Asthma   . Bipolar affective ([place])   . Complete miscarriage   . Constipation   . 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 Depression   . Diabetes mellitus    Type II  . Dyspnea     " only at night and I stop breathing in my sleep too for about the last three weeks"  . GERD (gastroesophageal reflux disease)   . Headache    "migraraines"  . Hyperlipidemia   . Hypertension   . Osteomyelitis ([place])    left foot  . Pregnancy complication    HELP Syndrome  . Renal disorder   . Schizophrenia ([place])   . Stroke [facility])    " mild stroke", memory loss- approx 2017    Medications:  No current facility-administered medications on file prior to encounter.    Current Outpatient Medications on File Prior to Encounter  Medication Sig Dispense Refill  . ADVAIR DISKUS 100-50 MCG/DOSE AEPB Inhale 1 puff into the lungs daily.  5  . alprazolam (XANAX) 2 MG tablet Take 1 tablet (2 mg total) by mouth 4 (four) times daily as needed for sleep. (Patient taking differently: Take 1 mg by mouth 5 (five) times daily. ) 30 tablet 0  . cloNIDine (CATAPRES) 0.1 MG tablet Take 0.1 mg by mouth 2 (two) times daily.  0  . clopidogrel (PLAVIX) 75 MG tablet Take 1  tablet (75 mg total) by mouth daily with breakfast. 30 tablet 3  . famotidine (PEPCID) 20 MG tablet Take 1 tablet (20 mg total) by mouth 2 (two) times daily. (Patient taking differently: Take 20 mg by mouth daily. ) 60 tablet 0  . gabapentin (NEURONTIN) 800 MG tablet Take 1 tablet (800 mg total) by mouth 2 (two) times daily. (Patient taking differently: Take 800 mg by mouth 5 (five) times daily. ) 60 tablet 0  . insulin glargine (LANTUS) 100 UNIT/ML injection Inject 0.2-0.3 mLs (20-30 Units total) into the skin 2 (two) times daily. 20 U in the morning and 30 U in the evening (Patient taking differently: Inject 30 Units into the skin 2 (two) times daily. ) 2 vial 0  . Insulin Syringe-Needle U-100 (INSULIN SYRINGE .5CC/31GX5/16") 31G X 5/16" 0.5 ML MISC 1 each by Does not apply route QID. 100 each 1  . LATUDA 120 MG TABS Take 120 mg by mouth at bedtime.  1  . metFORMIN (GLUCOPHAGE) 1000 MG tablet Take 1 tablet (1,000 mg total) by mouth 2 (two) times daily. (Patient taking differently: Take 500 mg by mouth 2 (two) times daily with a meal. ) 30 tablet 0  . metoCLOPramide (REGLAN) 5 MG tablet Take 1 tablet (5 mg total) by mouth 3 (three) times  daily before meals. 90 tablet 0  . metoprolol tartrate (LOPRESSOR) 25 MG tablet Take 0.5 tablets (12.5 mg total) by mouth 2 (two) times daily. 60 tablet 0  . mirtazapine (REMERON) 15 MG tablet Take 15 mg by mouth at bedtime.  0  . naproxen (NAPROSYN) 500 MG tablet Take 500 mg by mouth 2 (two) times daily.  0  . nicotine (NICODERM CQ - DOSED IN MG/24 HOURS) 21 mg/24hr patch Place 1 patch (21 mg total) onto the skin daily. 28 patch 0  . nitroGLYCERIN (NITRO-DUR) 0.2 mg/hr patch Place 1 patch (0.2 mg total) onto the skin daily. 30 patch 12  . NOVOLOG 100 UNIT/ML injection Inject 0-30 Units into the skin 3 (three) times daily with meals. Sliding Scale:  >300 15 units < 300 do not use 10 mL 3  . oxyCODONE-acetaminophen (PERCOCET) 10-325 MG tablet Take 1 tablet by mouth  every 6 (six) hours as needed for pain. 30 tablet 0  . oxyCODONE-acetaminophen (PERCOCET) 10-325 MG tablet Take 1 tablet by mouth every 8 (eight) hours as needed for pain. 40 tablet 0  . pantoprazole (PROTONIX) 40 MG tablet Take 1 tablet (40 mg total) by mouth daily. 30 tablet 0  . pentoxifylline (TRENTAL) 400 MG CR tablet Take 1 tablet (400 mg total) by mouth 3 (three) times daily with meals. 90 tablet 1  . PROAIR HFA 108 (90 BASE) MCG/ACT inhaler Inhale 2 puffs into the lungs every 6 (six) hours as needed for shortness of breath.   0  . silver sulfADIAZINE (SILVADENE) 1 % cream Apply 1 application topically daily. 50 g 0  . simvastatin (ZOCOR) 40 MG tablet Take 40 mg by mouth every evening.   2  . zolpidem (AMBIEN) 10 MG tablet Take 10 mg by mouth at bedtime as needed for sleep.  2     Assessment: 37 y.o. female with RLE ischemia for heparin Goal of Therapy:  Heparin level 0.3-0.7 units/ml Monitor platelets by anticoagulation protocol: Yes   Plan:  Heparin 4000 units IV bolus, then start heparin 1150 units/hr Check heparin level in 8 hours.   Caryl Pina 06/25/2018,4:23 AM

## 2018-06-25 NOTE — Progress Notes (Signed)
[place] for Heparin Indication: ischemic limb  Allergies  Allergen Reactions  . Hydrocodone-Acetaminophen Hives  . Tylenol [Acetaminophen] Itching and Swelling    Patient Measurements: Height: 5\' 9"  (175.3 cm) Weight: 160 lb 0.9 oz (72.6 kg) IBW/kg (Calculated) : 66.2  Vital Signs: Temp: 97.7 F (36.5 C) (09/15 1945) Temp Source: Oral (09/15 1945) BP: 144/91 (09/15 1945) Pulse Rate: 71 (09/15 1945)  Labs: Recent Labs    06/25/18 0129 06/25/18 0557 06/25/18 1213 06/25/18 2117  HGB 8.8*  --   --   --   HCT 26.9*  --   --   --   PLT 232  --   --   --   LABPROT  --  14.7  --   --   INR  --  1.16  --   --   HEPARINUNFRC  --   --  0.16* 0.50  CREATININE 2.25*  --   --   --     Estimated Creatinine Clearance: 35.1 mL/min (A) (by C-G formula based on SCr of 2.25 mg/dL (H)).   Medical History: Past Medical History:  Diagnosis Date  . Anxiety   . Asthma   . Bipolar affective ([place])   . Complete miscarriage   . Constipation   . 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 Depression   . Diabetes mellitus    Type II  . Dyspnea     " only at night and I stop breathing in my sleep too for about the last three weeks"  . GERD (gastroesophageal reflux disease)   . Headache    "migraraines"  . Hyperlipidemia   . Hypertension   . Osteomyelitis ([place])    left foot  . Pregnancy complication    HELP Syndrome  . Renal disorder   . Schizophrenia ([place])   . Stroke [facility])    " mild stroke", memory loss- approx 2017    Medications:  No current facility-administered medications on file prior to encounter.    Current Outpatient Medications on File Prior to Encounter  Medication Sig Dispense Refill  . ADVAIR DISKUS 100-50 MCG/DOSE AEPB Inhale 1 puff into the lungs daily.  5  . alprazolam (XANAX) 2 MG tablet Take 1 tablet (2 mg total) by mouth 4 (four) times daily as needed for sleep. (Patient taking differently: Take 2  mg by mouth 5 (five) times daily. ) 30 tablet 0  . busPIRone (BUSPAR) 15 MG tablet Take 15 mg by mouth 3 (three) times daily.  1  . cloNIDine (CATAPRES) 0.1 MG tablet Take 0.1 mg by mouth 2 (two) times daily.  0  . clopidogrel (PLAVIX) 75 MG tablet Take 1 tablet (75 mg total) by mouth daily with breakfast. 30 tablet 3  . DULoxetine (CYMBALTA) 30 MG capsule Take 90 mg by mouth every morning.  1  . famotidine (PEPCID) 20 MG tablet Take 1 tablet (20 mg total) by mouth 2 (two) times daily. (Patient taking differently: Take 20 mg by mouth daily. ) 60 tablet 0  . gabapentin (NEURONTIN) 800 MG tablet Take 1 tablet (800 mg total) by mouth 2 (two) times daily. (Patient taking differently: Take 800 mg by mouth 5 (five) times daily. ) 60 tablet 0  . insulin glargine (LANTUS) 100 UNIT/ML injection Inject 0.2-0.3 mLs (20-30 Units total) into the skin 2 (two) times daily. 20 U in the morning and 30 U in the evening (Patient taking differently: Inject 30 Units  into the skin 2 (two) times daily. ) 2 vial 0  . LATUDA 120 MG TABS Take 120 mg by mouth at bedtime.  1  . metFORMIN (GLUCOPHAGE) 1000 MG tablet Take 1 tablet (1,000 mg total) by mouth 2 (two) times daily. (Patient taking differently: Take 500 mg by mouth 2 (two) times daily with a meal. ) 30 tablet 0  . metoprolol tartrate (LOPRESSOR) 25 MG tablet Take 0.5 tablets (12.5 mg total) by mouth 2 (two) times daily. 60 tablet 0  . mirtazapine (REMERON) 15 MG tablet Take 15 mg by mouth at bedtime.  0  . naproxen (NAPROSYN) 500 MG tablet Take 500 mg by mouth 2 (two) times daily.  0  . NOVOLOG 100 UNIT/ML injection Inject 0-30 Units into the skin 3 (three) times daily with meals. Sliding Scale:  >300 15 units < 300 do not use 10 mL 3  . pantoprazole (PROTONIX) 40 MG tablet Take 1 tablet (40 mg total) by mouth daily. 30 tablet 0  . PROAIR HFA 108 (90 BASE) MCG/ACT inhaler Inhale 2 puffs into the lungs every 6 (six) hours as needed for shortness of breath.   0  . silver  sulfADIAZINE (SILVADENE) 1 % cream Apply 1 application topically daily. 50 g 0  . simvastatin (ZOCOR) 40 MG tablet Take 40 mg by mouth every evening.   2  . zolpidem (AMBIEN) 10 MG tablet Take 10 mg by mouth at bedtime as needed for sleep.  2  . Insulin Syringe-Needle U-100 (INSULIN SYRINGE .5CC/31GX5/16") 31G X 5/16" 0.5 ML MISC 1 each by Does not apply route QID. 100 each 1  . metoCLOPramide (REGLAN) 5 MG tablet Take 1 tablet (5 mg total) by mouth 3 (three) times daily before meals. (Patient not taking: Reported on 06/25/2018) 90 tablet 0  . nicotine (NICODERM CQ - DOSED IN MG/24 HOURS) 21 mg/24hr patch Place 1 patch (21 mg total) onto the skin daily. (Patient not taking: Reported on 06/25/2018) 28 patch 0  . nitroGLYCERIN (NITRO-DUR) 0.2 mg/hr patch Place 1 patch (0.2 mg total) onto the skin daily. (Patient not taking: Reported on 06/25/2018) 30 patch 12  . oxyCODONE-acetaminophen (PERCOCET) 10-325 MG tablet Take 1 tablet by mouth every 6 (six) hours as needed for pain. (Patient not taking: Reported on 06/25/2018) 30 tablet 0  . oxyCODONE-acetaminophen (PERCOCET) 10-325 MG tablet Take 1 tablet by mouth every 8 (eight) hours as needed for pain. (Patient not taking: Reported on 06/25/2018) 40 tablet 0  . pentoxifylline (TRENTAL) 400 MG CR tablet Take 1 tablet (400 mg total) by mouth 3 (three) times daily with meals. (Patient not taking: Reported on 06/25/2018) 90 tablet 1     Assessment: 37 y.o. female with RLE ischemia for heparin. Not on anticoag PTA.  Heparin level came back therapeutic after rate increase at 0.5, on 1400 units/hr. Hgb 8.8, plt 232. No s/sx of bleeding - besides menstrual cycle bleeding, monitor. No infusion issues.   Goal of Therapy:  Heparin level 0.3-0.7 units/ml Monitor platelets by anticoagulation protocol: Yes   Plan:  Continue heparin at 1400 units/hr Monitor daily HL, CBC, and signs/symptoms of bleeding   Doylene Canard, PharmD Clinical Pharmacist  Pager:  [phone] Phone: [phone] Please use AMION for clinical pharmacists numbers  06/25/2018      10:20 PM

## 2018-06-25 NOTE — H&P (View-Only) (Signed)
[facility] [place]  Patient name: Kathy Phelps MRN: [id number] DOB: [birth year] Sex: female  Reason for consult: Question ischemia to right foot  HPI: Kathy Phelps is a 37 y.o. female with a very complex past medical history.  She has poorly controlled diabetes and does have a history of renal insufficiency.  She had developed ulcerations on her left foot and had toe amputations with nonhealing.  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  She had continued nonhealing and underwent below-knee amputation with Dr. Sharol Given.  This failed to heal and she is recently had a revision of this which appears to be healing.  She reports that for the past several weeks she has been having more numbness than typically in her right foot.  She resorts presents to the emergency room with redness and pain this evening.  She also is reporting left flank pain and feels as though she has a urinary tract infection.  She has had more heavy vaginal bleeding than recently as well.  Past Medical History:  Diagnosis Date  . Anxiety   . Asthma   . Bipolar affective ([place])   . Complete miscarriage   . Constipation   . 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 Depression   . Diabetes mellitus    Type II  . Dyspnea     " only at night and I stop breathing in my sleep too for about the last three weeks"  . GERD (gastroesophageal reflux disease)   . Headache    "migraraines"  . Hyperlipidemia   . Hypertension   . Osteomyelitis ([place])    left foot  . Pregnancy complication    HELP Syndrome  . Renal disorder   . Schizophrenia ([place])   . Stroke [facility])    " mild stroke", memory loss- approx 2017    Family History  Problem Relation Age of Onset  . Diabetes Mellitus II Mother   . Heart disease Mother   . Heart disease Father     SOCIAL HISTORY: Social History    Tobacco Use  . Smoking status: Current Every Day Smoker    Packs/day: 1.00    Years: 28.00    Pack years: 28.00    Types: Cigarettes  . Smokeless tobacco: Never Used  Substance Use Topics  . Alcohol use: No    Allergies  Allergen Reactions  . Hydrocodone-Acetaminophen Hives  . Tylenol [Acetaminophen] Itching and Swelling    Current Facility-Administered Medications  Medication Dose Route Frequency Provider Last Rate Last Dose  . heparin ADULT infusion 100 units/mL (25000 units/210mL sodium chloride 0.45%)  1,150 Units/hr Intravenous Continuous Rancour, Stephen, MD 11.5 mL/hr at 06/25/18 0503 1,150 Units/hr at 06/25/18 0503  . piperacillin-tazobactam (ZOSYN) IVPB 3.375 g  3.375 g Intravenous Q8H Rancour, Stephen, MD 12.5 mL/hr at 06/25/18 0609 3.375 g at 06/25/18 0609  . sodium chloride 0.9 % bolus 1,000 mL  1,000 mL Intravenous Once Rancour, Annie Main, MD 983.6 mL/hr at 06/25/18 0550 1,000 mL at 06/25/18 0550  . vancomycin (VANCOCIN) IVPB 1000 mg/200 mL premix  1,000 mg Intravenous Once Rancour, Annie Main, MD       Current Outpatient Medications  Medication Sig Dispense Refill  . ADVAIR DISKUS 100-50 MCG/DOSE AEPB Inhale 1 puff into the lungs daily.  5  . alprazolam (XANAX) 2 MG tablet Take 1 tablet (2 mg total) by mouth 4 (  four) times daily as needed for sleep. (Patient taking differently: Take 2 mg by mouth 5 (five) times daily. ) 30 tablet 0  . busPIRone (BUSPAR) 15 MG tablet Take 15 mg by mouth 3 (three) times daily.  1  . cloNIDine (CATAPRES) 0.1 MG tablet Take 0.1 mg by mouth 2 (two) times daily.  0  . clopidogrel (PLAVIX) 75 MG tablet Take 1 tablet (75 mg total) by mouth daily with breakfast. 30 tablet 3  . DULoxetine (CYMBALTA) 30 MG capsule Take 90 mg by mouth every morning.  1  . famotidine (PEPCID) 20 MG tablet Take 1 tablet (20 mg total) by mouth 2 (two) times daily. (Patient taking differently: Take 20 mg by mouth daily. ) 60 tablet 0  . gabapentin (NEURONTIN) 800 MG  tablet Take 1 tablet (800 mg total) by mouth 2 (two) times daily. (Patient taking differently: Take 800 mg by mouth 5 (five) times daily. ) 60 tablet 0  . insulin glargine (LANTUS) 100 UNIT/ML injection Inject 0.2-0.3 mLs (20-30 Units total) into the skin 2 (two) times daily. 20 U in the morning and 30 U in the evening (Patient taking differently: Inject 30 Units into the skin 2 (two) times daily. ) 2 vial 0  . LATUDA 120 MG TABS Take 120 mg by mouth at bedtime.  1  . metFORMIN (GLUCOPHAGE) 1000 MG tablet Take 1 tablet (1,000 mg total) by mouth 2 (two) times daily. (Patient taking differently: Take 500 mg by mouth 2 (two) times daily with a meal. ) 30 tablet 0  . metoprolol tartrate (LOPRESSOR) 25 MG tablet Take 0.5 tablets (12.5 mg total) by mouth 2 (two) times daily. 60 tablet 0  . mirtazapine (REMERON) 15 MG tablet Take 15 mg by mouth at bedtime.  0  . naproxen (NAPROSYN) 500 MG tablet Take 500 mg by mouth 2 (two) times daily.  0  . NOVOLOG 100 UNIT/ML injection Inject 0-30 Units into the skin 3 (three) times daily with meals. Sliding Scale:  >300 15 units < 300 do not use 10 mL 3  . pantoprazole (PROTONIX) 40 MG tablet Take 1 tablet (40 mg total) by mouth daily. 30 tablet 0  . PROAIR HFA 108 (90 BASE) MCG/ACT inhaler Inhale 2 puffs into the lungs every 6 (six) hours as needed for shortness of breath.   0  . silver sulfADIAZINE (SILVADENE) 1 % cream Apply 1 application topically daily. 50 g 0  . simvastatin (ZOCOR) 40 MG tablet Take 40 mg by mouth every evening.   2  . zolpidem (AMBIEN) 10 MG tablet Take 10 mg by mouth at bedtime as needed for sleep.  2  . Insulin Syringe-Needle U-100 (INSULIN SYRINGE .5CC/31GX5/16") 31G X 5/16" 0.5 ML MISC 1 each by Does not apply route QID. 100 each 1  . metoCLOPramide (REGLAN) 5 MG tablet Take 1 tablet (5 mg total) by mouth 3 (three) times daily before meals. (Patient not taking: Reported on 06/25/2018) 90 tablet 0  . nicotine (NICODERM CQ - DOSED IN MG/24 HOURS)  21 mg/24hr patch Place 1 patch (21 mg total) onto the skin daily. (Patient not taking: Reported on 06/25/2018) 28 patch 0  . nitroGLYCERIN (NITRO-DUR) 0.2 mg/hr patch Place 1 patch (0.2 mg total) onto the skin daily. (Patient not taking: Reported on 06/25/2018) 30 patch 12  . oxyCODONE-acetaminophen (PERCOCET) 10-325 MG tablet Take 1 tablet by mouth every 6 (six) hours as needed for pain. (Patient not taking: Reported on 06/25/2018) 30 tablet 0  .  oxyCODONE-acetaminophen (PERCOCET) 10-325 MG tablet Take 1 tablet by mouth every 8 (eight) hours as needed for pain. (Patient not taking: Reported on 06/25/2018) 40 tablet 0  . pentoxifylline (TRENTAL) 400 MG CR tablet Take 1 tablet (400 mg total) by mouth 3 (three) times daily with meals. (Patient not taking: Reported on 06/25/2018) 90 tablet 1    REVIEW OF SYSTEMS:  [X]  denotes positive finding, [ ]  denotes negative finding Cardiac  Comments:  Chest pain or chest pressure:    Shortness of breath upon exertion:    Short of breath when lying flat:    Irregular heart rhythm:        Vascular    Pain in calf, thigh, or hip brought on by ambulation:    Pain in feet at night that wakes you up from your sleep:  x   Blood clot in your veins:    Leg swelling:           PHYSICAL EXAM: Vitals:   06/25/18 0536 06/25/18 0537 06/25/18 0538 06/25/18 0604  BP:    (!) 157/105  Pulse: 77 76 77 77  Resp:      Temp:      TempSrc:      SpO2: 100% 100% 100% 100%  Weight:      Height:        GENERAL: The patient is a well-nourished female, in no acute distress. The vital signs are documented above. CARDIOVASCULAR: She has easily palpable femoral pulses bilaterally.  I do not palpate a right popliteal or pedal pulses.  She has Doppler flow in the posterior tibial and peroneal arteries on the right. PULMONARY: There is good air exchange  MUSCULOSKELETAL: There are no major deformities or cyanosis. NEUROLOGIC: No focal weakness or paresthesias are  detected. SKIN: Her left below-knee amputation appears to be healing.  On the right she has red streaking on her pretibial area and some redness on the dorsum of her foot.  There is no fluctuance.  She does have some discoloration on the tips of her toes PSYCHIATRIC: The patient has a normal affect.  DATA:  Her creatinine is 2.  25  I reviewed her most recent arteriogram from November 2018.  At that time she had no evidence of iliac or SFA disease on the right.  She did have some narrowing on a high takeoff of her posterior tibial artery origin and did have occlusion of her anterior tibial.  She had a dominant peroneal artery to her ankle.  MEDICAL ISSUES: Difficult situation with multiple medical problems and uncontrolled diabetes.  Continues to smoke.  Nonhealing of multiple amputation revisions on the left leg.  I do not feel that she has any acute change with acute ischemia.  I am concerned that she may have progressed to stenosis or occlusion of her right superficial femoral artery since I do not palpate a right popliteal pulse.  I feel she needs inpatient hospitalization for IV antibiotics for control of the apparent lymphangitis in her right foot and for hydration.  Will need limited arteriogram to determine if revascularization is possible with either endovascular means or with surgical bypass.  Discussed this at length with the patient.  Explained that this is obviously putting her at risk for right leg amputation over time as well.    Rosetta Posner, MD FACS [facility] Tel [phone] Pager [phone]

## 2018-06-25 NOTE — ED Provider Notes (Signed)
[facility] EMERGENCY DEPARTMENT Provider Note   CSN: [id number] Arrival date & time: 06/25/18  0107     History   Chief Complaint Chief Complaint  Patient presents with  . Leg Pain  . Urinary Frequency    HPI HERAN CAMPAU is a 37 y.o. female.  Patient reports pain and discoloration to her right foot that started 2 days ago.  She is concerned because she is had a previous left BKA with multiple revisions last on 3 weeks ago by Dr. Sharol Given.  She reports that her toes were purple up until 2 days ago with a return to turn white and she is had some red streaking on her dorsal foot that extends up to her shin.  She is concerned about infection.  Denies fevers, chills, nausea or vomiting.  Reports having frequent urination and dysuria as well as left flank pain.  Reports left flank pain started yesterday and has been constant and associated with frequency and urgency and dysuria.  Thinks it is her kidney hurting.  She is also reporting vaginal bleeding ongoing since yesterday.  States she is used about 5 tampons and 5 pads since yesterday.  Denies any possibility of pregnancy.  She does not take any blood thinners.  The history is provided by the patient.    Past Medical History:  Diagnosis Date  . Anxiety   . Asthma   . Bipolar affective ([place])   . Complete miscarriage   . Constipation   . 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 Depression   . Diabetes mellitus    Type II  . Dyspnea     " only at night and I stop breathing in my sleep too for about the last three weeks"  . GERD (gastroesophageal reflux disease)   . Headache    "migraraines"  . Hyperlipidemia   . Hypertension   . Osteomyelitis ([place])    left foot  . Pregnancy complication    HELP Syndrome  . Renal disorder   . Schizophrenia ([place])   . Stroke [facility])    " mild stroke", memory loss- approx 2017    Patient Active Problem List   Diagnosis Date Noted  . Hx of BKA, left  ([place]) 05/24/2018  . History of left below knee amputation ([place]) 02/01/2018  . Gangrene of left foot ([place])   . Dehiscence of amputation stump ([place]) 01/26/2018  . Surgical wound, non healing 09/02/2017  . Bipolar 1 disorder ([place]) 09/02/2017  . Nausea vomiting and diarrhea 08/11/2017  . Great toe pain, left 08/11/2017  . GERD (gastroesophageal reflux disease) 08/10/2017  . Anxiety 08/10/2017  . Edema   . Cellulitis   . AKI (acute kidney injury) ([place]) 02/04/2017  . Type II diabetes mellitus with renal manifestations, uncontrolled ([place]) 02/02/2017  . HLD (hyperlipidemia) 02/02/2017  . Tobacco abuse 02/02/2017  . Essential hypertension 02/02/2017  . Malnutrition of moderate degree 02/02/2017    Past Surgical History:  Procedure Laterality Date  . ABDOMINAL AORTOGRAM N/A 09/07/2017   Procedure: ABDOMINAL AORTOGRAM;  Surgeon: Waynetta Sandy, MD;  Location: [place];  Service: Cardiovascular;  Laterality: N/A;  . ABDOMINAL AORTOGRAM W/LOWER EXTREMITY N/A 02/03/2017   Procedure: Abdominal Aortogram w/Lower Extremity;  Surgeon: Waynetta Sandy, MD;  Location: [facility];  Service: Cardiovascular;  Laterality: N/A;  . AMPUTATION Left 02/10/2017   Procedure: AMPUTATION TOES 3, 4 AND 5  LEFT FOOT;  Surgeon: Erlene Quan  Dione Plover, MD;  Location: [facility];  Service: Vascular;  Laterality: Left;  . AMPUTATION Left 12/21/2017   Procedure: LEFT FOOT 5TH RAY AMPUTATION;  Surgeon: Newt Minion, MD;  Location: [facility];  Service: Orthopedics;  Laterality: Left;  . AMPUTATION Left 02/01/2018   Procedure: LEFT BELOW KNEE AMPUTATION;  Surgeon: Newt Minion, MD;  Location: [facility];  Service: Orthopedics;  Laterality: Left;  . CESAREAN SECTION    . CHOLECYSTECTOMY    . LOWER EXTREMITY ANGIOGRAPHY Bilateral 09/07/2017   Procedure: Lower Extremity Angiography;  Surgeon: Waynetta Sandy, MD;  Location: [facility];  Service: Cardiovascular;  Laterality:  Bilateral;  . PERIPHERAL VASCULAR ATHERECTOMY  02/03/2017   Procedure: Peripheral Vascular Atherectomy;  Surgeon: Waynetta Sandy, MD;  Location: [place] [facility];  Service: Cardiovascular;;  . PERIPHERAL VASCULAR BALLOON ANGIOPLASTY  02/03/2017   Procedure: Peripheral Vascular Balloon Angioplasty;  Surgeon: Waynetta Sandy, MD;  Location: [facility];  Service: Cardiovascular;;  . PERIPHERAL VASCULAR INTERVENTION Left 09/07/2017   Procedure: PERIPHERAL VASCULAR INTERVENTION;  Surgeon: Waynetta Sandy, MD;  Location: [facility] [place];  Service: Cardiovascular;  Laterality: Left;  SFA/POPLITEAL  . STUMP REVISION Left 05/24/2018  . STUMP REVISION Left 05/24/2018   Procedure: REVISION LEFT BELOW KNEE AMPUTATION;  Surgeon: Newt Minion, MD;  Location: [facility];  Service: Orthopedics;  Laterality: Left;  . TUBAL LIGATION       OB History   None      Home Medications    Prior to Admission medications   Medication Sig Start Date End Date Taking? Authorizing Provider  ADVAIR DISKUS 100-50 MCG/DOSE AEPB Inhale 1 puff into the lungs daily. 01/10/15   [provider]  alprazolam Duanne Moron) 2 MG tablet Take 1 tablet (2 mg total) by mouth 4 (four) times daily as needed for sleep. Patient taking differently: Take 1 mg by mouth 5 (five) times daily.  08/12/17   Debbe Odea, MD  cloNIDine (CATAPRES) 0.1 MG tablet Take 0.1 mg by mouth 2 (two) times daily. 05/12/18   [provider]  clopidogrel (PLAVIX) 75 MG tablet Take 1 tablet (75 mg total) by mouth daily with breakfast. 09/08/17   Rhyne, Hulen Shouts, PA-C  famotidine (PEPCID) 20 MG tablet Take 1 tablet (20 mg total) by mouth 2 (two) times daily. Patient taking differently: Take 20 mg by mouth daily.  08/12/17   Debbe Odea, MD  gabapentin (NEURONTIN) 800 MG tablet Take 1 tablet (800 mg total) by mouth 2 (two) times daily. Patient taking differently: Take 800 mg by mouth 5 (five) times daily.  02/11/17    Hosie Poisson, MD  insulin glargine (LANTUS) 100 UNIT/ML injection Inject 0.2-0.3 mLs (20-30 Units total) into the skin 2 (two) times daily. 20 U in the morning and 30 U in the evening Patient taking differently: Inject 30 Units into the skin 2 (two) times daily.  08/12/17   Caren Griffins, MD  Insulin Syringe-Needle U-100 (INSULIN SYRINGE .5CC/31GX5/16") 31G X 5/16" 0.5 ML MISC 1 each by Does not apply route QID. 08/12/17   Gherghe, Costin M, MD  LATUDA 120 MG TABS Take 120 mg by mouth at bedtime. 04/12/16   [provider]  metFORMIN (GLUCOPHAGE) 1000 MG tablet Take 1 tablet (1,000 mg total) by mouth 2 (two) times daily. Patient taking differently: Take 500 mg by mouth 2 (two) times daily with a meal.  07/16/14   Pisciotta, Elmyra Ricks, PA-C  metoCLOPramide (REGLAN) 5 MG  tablet Take 1 tablet (5 mg total) by mouth 3 (three) times daily before meals. 09/08/17   Bonnielee Haff, MD  metoprolol tartrate (LOPRESSOR) 25 MG tablet Take 0.5 tablets (12.5 mg total) by mouth 2 (two) times daily. 02/11/17   Hosie Poisson, MD  mirtazapine (REMERON) 15 MG tablet Take 15 mg by mouth at bedtime. 12/22/17   [provider]  naproxen (NAPROSYN) 500 MG tablet Take 500 mg by mouth 2 (two) times daily. 05/12/18   [provider]  nicotine (NICODERM CQ - DOSED IN MG/24 HOURS) 21 mg/24hr patch Place 1 patch (21 mg total) onto the skin daily. 02/12/17   Hosie Poisson, MD  nitroGLYCERIN (NITRO-DUR) 0.2 mg/hr patch Place 1 patch (0.2 mg total) onto the skin daily. 01/16/18   Newt Minion, MD  NOVOLOG 100 UNIT/ML injection Inject 0-30 Units into the skin 3 (three) times daily with meals. Sliding Scale:  >300 15 units < 300 do not use 08/12/17   Gherghe, Vella Redhead, MD  oxyCODONE-acetaminophen (PERCOCET) 10-325 MG tablet Take 1 tablet by mouth every 6 (six) hours as needed for pain. 05/25/18   Newt Minion, MD  oxyCODONE-acetaminophen (PERCOCET) 10-325 MG tablet Take 1 tablet by mouth every 8 (eight) hours as  needed for pain. 06/01/18   Newt Minion, MD  pantoprazole (PROTONIX) 40 MG tablet Take 1 tablet (40 mg total) by mouth daily. 02/11/17   Hosie Poisson, MD  pentoxifylline (TRENTAL) 400 MG CR tablet Take 1 tablet (400 mg total) by mouth 3 (three) times daily with meals. 01/16/18   Newt Minion, MD  PROAIR HFA 108 [phone] BASE) MCG/ACT inhaler Inhale 2 puffs into the lungs every 6 (six) hours as needed for shortness of breath.  01/10/15   [provider]  silver sulfADIAZINE (SILVADENE) 1 % cream Apply 1 application topically daily. 04/20/18   Suzan Slick, NP  simvastatin (ZOCOR) 40 MG tablet Take 40 mg by mouth every evening.  04/25/16   [provider]  zolpidem (AMBIEN) 10 MG tablet Take 10 mg by mouth at bedtime as needed for sleep. 04/26/16   [provider]    Family History Family History  Problem Relation Age of Onset  . Diabetes Mellitus II Mother   . Heart disease Mother   . Heart disease Father     Social History Social History   Tobacco Use  . Smoking status: Current Every Day Smoker    Packs/day: 1.00    Years: 28.00    Pack years: 28.00    Types: Cigarettes  . Smokeless tobacco: Never Used  Substance Use Topics  . Alcohol use: No  . Drug use: No     Allergies   Hydrocodone-acetaminophen and Tylenol [acetaminophen]   Review of Systems Review of Systems  Constitutional: Positive for activity change, appetite change and fatigue. Negative for fever.  HENT: Negative for congestion and rhinorrhea.   Eyes: Negative for visual disturbance.  Respiratory: Negative for chest tightness.   Cardiovascular: Negative for chest pain.  Gastrointestinal: Positive for abdominal pain. Negative for nausea and vomiting.  Genitourinary: Positive for dysuria, flank pain, frequency, hematuria, menstrual problem, pelvic pain and vaginal bleeding.  Musculoskeletal: Positive for arthralgias, back pain and myalgias.  Skin: Negative for rash.  Neurological: Positive  for weakness. Negative for dizziness and headaches.    all other systems are negative except as noted in the HPI and PMH.    Physical Exam Updated Vital Signs BP (!) 124/94 (BP Location: Right  Arm)   Pulse 85   Temp 98.4 F (36.9 C) (Oral)   Resp 14   Ht 5\' 9"  (1.753 m)   Wt 72.6 kg   SpO2 100%   BMI 23.64 kg/m   Physical Exam  Constitutional: She is oriented to person, place, and time. She appears well-developed and well-nourished. No distress.  HENT:  Head: Normocephalic and atraumatic.  Mouth/Throat: Oropharynx is clear and moist. No oropharyngeal exudate.  Eyes: Pupils are equal, round, and reactive to light. Conjunctivae and EOM are normal.  Neck: Normal range of motion. Neck supple.  No meningismus.  Cardiovascular: Normal rate, regular rhythm, normal heart sounds and intact distal pulses.  No murmur heard. Pulmonary/Chest: Effort normal and breath sounds normal. No respiratory distress.  Abdominal: Soft. There is tenderness. There is no rebound and no guarding.  Mild Left sided tenderness  Musculoskeletal: Normal range of motion. She exhibits tenderness. She exhibits no edema.  Left CVA lower abdominal tenderness  Sutures intact to left BKA site.  Appears to be healing appropriately.  Right foot is cool to palpation and toes are white appearing.  Streaking erythema on dorsal foot and shin. PT pulse present with Doppler.  Unable to Doppler DP pulse.  Neurological: She is alert and oriented to person, place, and time. No cranial nerve deficit. She exhibits normal muscle tone. Coordination normal.   5/5 strength throughout. CN 2-12 intact.Equal grip strength.   Skin: Skin is warm.  Psychiatric: She has a normal mood and affect. Her behavior is normal.  Nursing note and vitals reviewed.    ED Treatments / Results  Labs (all labs ordered are listed, but only abnormal results are displayed) Labs Reviewed  COMPREHENSIVE METABOLIC PANEL - Abnormal; Notable for the  following components:      Result Value   Glucose, Bld 181 (*)    BUN 23 (*)    Creatinine, Ser 2.25 (*)    Calcium 8.6 (*)    Total Protein 6.4 (*)    Albumin 3.0 (*)    AST 11 (*)    GFR calc non Af Amer 26 (*)    GFR calc Af Amer 30 (*)    All other components within normal limits  CBC WITH DIFFERENTIAL/PLATELET - Abnormal; Notable for the following components:   WBC 15.6 (*)    RBC 2.90 (*)    Hemoglobin 8.8 (*)    HCT 26.9 (*)    Neutro Abs 10.6 (*)    All other components within normal limits  URINALYSIS, ROUTINE W REFLEX MICROSCOPIC - Abnormal; Notable for the following components:   APPearance HAZY (*)    Glucose, UA 50 (*)    Hgb urine dipstick LARGE (*)    Protein, ur 100 (*)    RBC / HPF >50 (*)    Bacteria, UA RARE (*)    All other components within normal limits  CULTURE, BLOOD (ROUTINE X 2)  CULTURE, BLOOD (ROUTINE X 2)  HEPARIN LEVEL (UNFRACTIONATED)  I-STAT CG4 LACTIC ACID, ED  I-STAT BETA HCG BLOOD, ED ([facility], [facility], AP ONLY)  I-STAT CG4 LACTIC ACID, ED  I-STAT CG4 LACTIC ACID, ED    EKG None  Radiology Ct Renal Stone Study  Result Date: 06/25/2018 CLINICAL DATA:  Flank pain. EXAM: CT ABDOMEN AND PELVIS WITHOUT CONTRAST TECHNIQUE: Multidetector CT imaging of the abdomen and pelvis was performed following the standard protocol without IV contrast. COMPARISON:  Radiographs 09/06/2017 FINDINGS: Lower chest: Emphysema noted at the lung bases. No consolidation or  pleural fluid. Hepatobiliary: No focal liver abnormality is seen. Status post cholecystectomy. No biliary dilatation. Pancreas: No ductal dilatation or inflammation. Spleen: Normal in size without focal abnormality. Adrenals/Urinary Tract: Normal adrenal glands. No urolithiasis. No hydronephrosis. No significant perinephric edema. Both ureters are decompressed without stone along the course. Urinary bladder near completely empty without stone. Stomach/Bowel: Stomach is within normal limits. Appendix appears  normal. No evidence of bowel wall thickening, distention, or inflammatory changes. Mild colonic tortuosity Vascular/Lymphatic: Mild aorta bi-iliac atherosclerosis. No enlarged abdominal or pelvic lymph nodes. Reproductive: Uterus and bilateral adnexa are unremarkable. Other: No free air, free fluid, or intra-abdominal fluid collection. Musculoskeletal: There are no acute or suspicious osseous abnormalities. IMPRESSION: 1. No renal stones or obstructive uropathy. 2. No acute findings in the abdomen or pelvis. 3.  Aortic Atherosclerosis (ICD10-I70.0). Electronically Signed   By: Keith Rake M.D.   On: 06/25/2018 05:08    Procedures Procedures (including critical care time)  Medications Ordered in ED Medications  heparin bolus via infusion 4,000 Units (has no administration in time range)  heparin ADULT infusion 100 units/mL (25000 units/284mL sodium chloride 0.45%) (has no administration in time range)     Initial Impression / Assessment and Plan / ED Course  I have reviewed the triage vital signs and the nursing notes.  Pertinent labs & imaging results that were available during my care of the patient were reviewed by me and considered in my medical decision making (see chart for details).    Patient with 2 days of right leg pain and discoloration.  Also with left flank pain and vaginal bleeding onset at the same time.  Vitals are stable.  Concern for ischemic right leg though PT pulses present with Doppler.  Discussed with Dr. early vascular surgery who is in the OR.  Discussed with Dr. Ninfa Linden on-call for Dr. Sharol Given.  He feels this is more of a vascular issue with orthopedic issue.  Left BKA appears to be healing appropriately.  hCG is negative.  Hemoglobin has decreased slightly to 8.8.  CKD slightly worse.  Patient hydrated.  Broad-spectrum antibiotic started for possible lymphangitis and cellulitis of right leg. Noncontrast CT scan shows no kidney stone or other explanation for  flank pain.  Dr. early has seen patient.  He does agree that she has some ischemia to her right leg despite the pulses being intact. She had an arteriogram several months ago that showed she had SFA disease on the right and there is no popliteal pulse palpable today.  He agrees with IV heparin, IV antibiotics and will plan for arteriogram tomorrow with hopeful improvement of patient's creatinine. Request medicine admission.  UA shows hematuria without infection.  CT scan does not show etiology of patient's flank pain.  Discussed with patient that IV heparin may worsen her vaginal bleeding but it is required to try to salvage her foot.  Admission will be discussed with medicine team.  CRITICAL CARE Performed by: Ezequiel Essex Total critical care time: 45- minutes Critical care time was exclusive of separately billable procedures and treating other patients. Critical care was necessary to treat or prevent imminent or life-threatening deterioration. Critical care was time spent personally by me on the following activities: development of treatment plan with patient and/or surrogate as well as nursing, discussions with consultants, evaluation of patient's response to treatment, examination of patient, obtaining history from patient or surrogate, ordering and performing treatments and interventions, ordering and review of laboratory studies, ordering and review of radiographic studies, pulse  oximetry and re-evaluation of patient's condition.  Final Clinical Impressions(s) / ED Diagnoses   Final diagnoses:  None    ED Discharge Orders    None       Kamaile Zachow, Annie Main, MD 06/25/18 [phone]

## 2018-06-26 ENCOUNTER — Inpatient Hospital Stay (HOSPITAL_COMMUNITY): Payer: Medicaid Other

## 2018-06-26 DIAGNOSIS — N189 Chronic kidney disease, unspecified: Secondary | ICD-10-CM

## 2018-06-26 DIAGNOSIS — Z01810 Encounter for preprocedural cardiovascular examination: Secondary | ICD-10-CM

## 2018-06-26 LAB — GLUCOSE, CAPILLARY
GLUCOSE-CAPILLARY: 323 mg/dL — AB (ref 70–99)
GLUCOSE-CAPILLARY: 209 mg/dL — AB (ref 70–99)
GLUCOSE-CAPILLARY: 190 mg/dL — AB (ref 70–99)
Glucose-Capillary: 208 mg/dL — ABNORMAL HIGH (ref 70–99)
Glucose-Capillary: 308 mg/dL — ABNORMAL HIGH (ref 70–99)

## 2018-06-26 LAB — BASIC METABOLIC PANEL
Anion gap: 7 (ref 5–15)
BUN: 20 mg/dL (ref 6–20)
CHLORIDE: 108 mmol/L (ref 98–111)
CO2: 24 mmol/L (ref 22–32)
Calcium: 8.2 mg/dL — ABNORMAL LOW (ref 8.9–10.3)
Creatinine, Ser: 2.07 mg/dL — ABNORMAL HIGH (ref 0.44–1.00)
GFR calc Af Amer: 34 mL/min — ABNORMAL LOW (ref >60)
GFR calc non Af Amer: 29 mL/min — ABNORMAL LOW (ref >60)
Glucose, Bld: 298 mg/dL — ABNORMAL HIGH (ref 70–99)
Potassium: 4 mmol/L (ref 3.5–5.1)
SODIUM: 139 mmol/L (ref 135–145)

## 2018-06-26 LAB — CBC
HEMATOCRIT: 24.8 % — AB (ref 36.0–46.0)
Hemoglobin: 8 g/dL — ABNORMAL LOW (ref 12.0–15.0)
MCH: 30.1 pg (ref 26.0–34.0)
MCHC: 32.3 g/dL (ref 30.0–36.0)
MCV: 93.2 fL (ref 78.0–100.0)
Platelets: 184 10*3/uL (ref 150–400)
RBC: 2.66 MIL/uL — AB (ref 3.87–5.11)
RDW: 12.5 % (ref 11.5–15.5)
WBC: 8.6 10*3/uL (ref 4.0–10.5)

## 2018-06-26 LAB — HEPARIN LEVEL (UNFRACTIONATED): Heparin Unfractionated: 0.33 IU/mL (ref 0.30–0.70)

## 2018-06-26 MED ORDER — HYDROMORPHONE HCL 1 MG/ML IJ SOLN
0.1500 mg | Freq: Once | INTRAMUSCULAR | Status: AC
  Administered 2018-06-26: 0.15 mg via INTRAVENOUS
  Filled 2018-06-26: qty 1

## 2018-06-26 MED ORDER — ATORVASTATIN CALCIUM 80 MG PO TABS
80.0000 mg | ORAL_TABLET | Freq: Every day | ORAL | Status: DC
  Administered 2018-06-26 – 2018-07-02 (×6): 80 mg via ORAL
  Filled 2018-06-26 (×6): qty 1
  Filled 2018-06-26: qty 2

## 2018-06-26 MED ORDER — SODIUM CHLORIDE 0.9 % IV SOLN
INTRAVENOUS | Status: DC
  Administered 2018-06-26 – 2018-06-27 (×2): via INTRAVENOUS

## 2018-06-26 NOTE — Progress Notes (Signed)
[facility] for Heparin Indication: ischemic limb  Allergies  Allergen Reactions  . Hydrocodone-Acetaminophen Hives  . Tylenol [Acetaminophen] Itching and Swelling    Patient Measurements: Height: 5\' 9"  (175.3 cm) Weight: 160 lb 0.9 oz (72.6 kg) IBW/kg (Calculated) : 66.2  Vital Signs: Temp: 98 F (36.7 C) (09/16 0341) Temp Source: Oral (09/16 0341) BP: 132/101 (09/16 0341) Pulse Rate: 77 (09/16 0341)  Labs: Recent Labs    06/25/18 0129 06/25/18 0557 06/25/18 1213 06/25/18 2117 06/26/18 0357  HGB 8.8*  --   --   --  8.0*  HCT 26.9*  --   --   --  24.8*  PLT 232  --   --   --  184  LABPROT  --  14.7  --   --   --   INR  --  1.16  --   --   --   HEPARINUNFRC  --   --  0.16* 0.50 0.33  CREATININE 2.25*  --   --   --  2.07*    Estimated Creatinine Clearance: 38.1 mL/min (A) (by C-G formula based on SCr of 2.07 mg/dL (H)).   Medical History: Past Medical History:  Diagnosis Date  . Anxiety   . Asthma   . Bipolar affective ([place])   . Complete miscarriage   . Constipation   . 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 Depression   . Diabetes mellitus    Type II  . Dyspnea     " only at night and I stop breathing in my sleep too for about the last three weeks"  . GERD (gastroesophageal reflux disease)   . Headache    "migraraines"  . Hyperlipidemia   . Hypertension   . Osteomyelitis ([place])    left foot  . Pregnancy complication    HELP Syndrome  . Renal disorder   . Schizophrenia ([place])   . Stroke [facility])    " mild stroke", memory loss- approx 2017    Medications:  No current facility-administered medications on file prior to encounter.    Current Outpatient Medications on File Prior to Encounter  Medication Sig Dispense Refill  . ADVAIR DISKUS 100-50 MCG/DOSE AEPB Inhale 1 puff into the lungs daily.  5  . alprazolam (XANAX) 2 MG tablet Take 1 tablet (2 mg total) by mouth 4 (four) times daily as  needed for sleep. (Patient taking differently: Take 2 mg by mouth 5 (five) times daily. ) 30 tablet 0  . busPIRone (BUSPAR) 15 MG tablet Take 15 mg by mouth 3 (three) times daily.  1  . cloNIDine (CATAPRES) 0.1 MG tablet Take 0.1 mg by mouth 2 (two) times daily.  0  . clopidogrel (PLAVIX) 75 MG tablet Take 1 tablet (75 mg total) by mouth daily with breakfast. 30 tablet 3  . DULoxetine (CYMBALTA) 30 MG capsule Take 90 mg by mouth every morning.  1  . famotidine (PEPCID) 20 MG tablet Take 1 tablet (20 mg total) by mouth 2 (two) times daily. (Patient taking differently: Take 20 mg by mouth daily. ) 60 tablet 0  . gabapentin (NEURONTIN) 800 MG tablet Take 1 tablet (800 mg total) by mouth 2 (two) times daily. (Patient taking differently: Take 800 mg by mouth 5 (five) times daily. ) 60 tablet 0  . insulin glargine (LANTUS) 100 UNIT/ML injection Inject 0.2-0.3 mLs (20-30 Units total) into the skin 2 (two) times daily. 20 U in the  morning and 30 U in the evening (Patient taking differently: Inject 30 Units into the skin 2 (two) times daily. ) 2 vial 0  . LATUDA 120 MG TABS Take 120 mg by mouth at bedtime.  1  . metFORMIN (GLUCOPHAGE) 1000 MG tablet Take 1 tablet (1,000 mg total) by mouth 2 (two) times daily. (Patient taking differently: Take 500 mg by mouth 2 (two) times daily with a meal. ) 30 tablet 0  . metoprolol tartrate (LOPRESSOR) 25 MG tablet Take 0.5 tablets (12.5 mg total) by mouth 2 (two) times daily. 60 tablet 0  . mirtazapine (REMERON) 15 MG tablet Take 15 mg by mouth at bedtime.  0  . naproxen (NAPROSYN) 500 MG tablet Take 500 mg by mouth 2 (two) times daily.  0  . NOVOLOG 100 UNIT/ML injection Inject 0-30 Units into the skin 3 (three) times daily with meals. Sliding Scale:  >300 15 units < 300 do not use 10 mL 3  . pantoprazole (PROTONIX) 40 MG tablet Take 1 tablet (40 mg total) by mouth daily. 30 tablet 0  . PROAIR HFA 108 (90 BASE) MCG/ACT inhaler Inhale 2 puffs into the lungs every 6 (six)  hours as needed for shortness of breath.   0  . silver sulfADIAZINE (SILVADENE) 1 % cream Apply 1 application topically daily. 50 g 0  . simvastatin (ZOCOR) 40 MG tablet Take 40 mg by mouth every evening.   2  . zolpidem (AMBIEN) 10 MG tablet Take 10 mg by mouth at bedtime as needed for sleep.  2  . Insulin Syringe-Needle U-100 (INSULIN SYRINGE .5CC/31GX5/16") 31G X 5/16" 0.5 ML MISC 1 each by Does not apply route QID. 100 each 1  . metoCLOPramide (REGLAN) 5 MG tablet Take 1 tablet (5 mg total) by mouth 3 (three) times daily before meals. (Patient not taking: Reported on 06/25/2018) 90 tablet 0  . nicotine (NICODERM CQ - DOSED IN MG/24 HOURS) 21 mg/24hr patch Place 1 patch (21 mg total) onto the skin daily. (Patient not taking: Reported on 06/25/2018) 28 patch 0  . nitroGLYCERIN (NITRO-DUR) 0.2 mg/hr patch Place 1 patch (0.2 mg total) onto the skin daily. (Patient not taking: Reported on 06/25/2018) 30 patch 12  . oxyCODONE-acetaminophen (PERCOCET) 10-325 MG tablet Take 1 tablet by mouth every 6 (six) hours as needed for pain. (Patient not taking: Reported on 06/25/2018) 30 tablet 0  . oxyCODONE-acetaminophen (PERCOCET) 10-325 MG tablet Take 1 tablet by mouth every 8 (eight) hours as needed for pain. (Patient not taking: Reported on 06/25/2018) 40 tablet 0  . pentoxifylline (TRENTAL) 400 MG CR tablet Take 1 tablet (400 mg total) by mouth 3 (three) times daily with meals. (Patient not taking: Reported on 06/25/2018) 90 tablet 1     Assessment: 37 y.o. female with RLE ischemia on IV heparin. Not on anticoag PTA.  Heparin level remains therapeutic at 0.33 (trend down) on 1400 units/hr. Hgb 8-stable, platelets are within normal limits. No s/sx of bleeding - besides menstrual cycle bleeding, monitor. No infusion issues.   Goal of Therapy:  Heparin level 0.3-0.7 units/ml Monitor platelets by anticoagulation protocol: Yes   Plan:  Continue heparin at 1400 units/hr Monitor daily HL, CBC, and signs/symptoms  of bleeding   Sloan Leiter, PharmD, BCPS, BCCCP Clinical Pharmacist Clinical phone 06/26/2018 until 3:30PM - #[phone] Please refer to [facility] for [place] numbers 06/26/2018      7:42 AM

## 2018-06-26 NOTE — Progress Notes (Signed)
   Patient will not have angiogram performed today will be on scheduled for late tomorrow morning early afternoon.  She will be n.p.o. past midnight tonight.  Jermel Artley C. Donzetta Matters, MD [facility] Office: [phone] Pager: [phone]

## 2018-06-26 NOTE — Plan of Care (Signed)

## 2018-06-26 NOTE — Progress Notes (Signed)
ABI's have been completed. Right 0.76 Left BKA  06/26/18 4:15 PM Carlos Levering RVT

## 2018-06-26 NOTE — Progress Notes (Addendum)
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 Currently hospital day 2.  I have evaluated and examined Kathy Phelps this am. She was laying in her bed wincing in pain with any movement of her RLE or back. She continues to experience burning pain to her RLE that is currently rated as an 8/10 w/ the percocet prescribed which is improved from a 9-10/10 yesterday. She continues to endorse L flank pain and reported that she soaked through 2 thick pads overnight. Aside from these main complaints, she currently endorses chills and mild nausea, but denies any fever, new onset HA, CP, SOB, vomiting, diarrhea, abdominal pain or further complaints. We informed her of the plans to have an arteriogram scheduled for tomorrow.  Objective:  Vital signs in last 24 hours: Vitals:   06/25/18 1620 06/25/18 1945 06/26/18 0341 06/26/18 1148  BP: (!) 147/100 (!) 144/91 (!) 132/101 (!) 130/93  Pulse:  71 77 81  Resp:  14 16 16   Temp: 98.9 F (37.2 C) 97.7 F (36.5 C) 98 F (36.7 C) 97.8 F (36.6 C)  TempSrc: Axillary Oral Oral Oral  SpO2: 99% 100% 100% 100%  Weight:      Height:       Physical Exam  Constitutional: She is oriented to person, place, and time.  Cooperative caucasian female in moderate distress  HENT:  Head: Normocephalic and atraumatic.  Right Ear: External ear normal.  Left Ear: External ear normal.  Eyes: Conjunctivae and EOM are normal.  Neck: Normal range of motion. Neck supple.  Cardiovascular: Normal rate, regular rhythm and normal heart sounds. Exam reveals no gallop and no friction rub.  No murmur heard. Pulses:      Radial pulses are 2+ on the right side, and 2+ on the left side.       Popliteal pulses are 0 on the right side, and 0 on  the left side.       Dorsalis pedis pulses are 0 on the right side, and 0 on the left side.       Posterior tibial pulses are 2+ on the right side, and 2+ on the left side.  Pulmonary/Chest: Effort normal and breath sounds normal. No respiratory distress. She has no wheezes. She has no rales. She exhibits no tenderness.  Abdominal: Soft. Bowel sounds are normal. She exhibits distension (secondary to body habitus). She exhibits no mass. There is no tenderness. There is no rebound and no guarding.  Musculoskeletal:  L BKA with intact dressing. RLE still has streaking erythema from the dorsum of the foot extending to the anterior mid shin, but is much improved and less apparent today compared to yesterday. Severe tenderness still appreciated in RLE superior to the malleolus which remains unchanged from yesterday. Moderate L CVA tenderness to palpation which remains unchanged from yesterda  Neurological: She is alert and oriented to person, place, and time.  Decreased sensation of RLE from thigh distally with absent sensation in the foot which remains unchanged from yesterday  Skin:  Cool to touch in RLE    Assessment/Plan:  Principal Problem:   Lower limb ischemia Active Problems:   Type II diabetes mellitus with renal manifestations, uncontrolled (HCC)   Tobacco abuse   Essential hypertension   AKI (acute  kidney injury) ([place])   Cellulitis   Lymphangitis   Menorrhagia  This is a 37 y.o Caucasian Female w/ a PMHx of uncontrolled T2DM, 25 pack year 1ppd smoker, HTN & dyslipidemia presenting w/ RLE ischemia with associated lymphangitis. Currently hospital day 2.  RLE ischemia w/ Lymphangitis & Cellulitis. Lymphangitis has improved w/ Vanc & Zosyn. Pain somewhat managed w/ Percocet. Vascular surgery will proceed w/ an arteriogram tomorrow morning/afternoon - Await arteriogram tomorrow - Continue Heparin 1400 unit/hr - Continue Percocet - Keep NPO after midnight  Acute Menorrhagia. Pt  reports she soaked through 2 thick pads overnight, but in my evaluation, she did not exhibit any significant pallor. HgB and Heparin level still stable at 8 & 0.33 units/ml, but in case HgB continues to drop, we have already type and screened her and are ready to transfuse.  - Continue to monitor CBC - Continue to monitor Heparin level  Uncontrolled T2DM, insulin dependent, w/ neuropathy. BS remains in the 200-300 range despite being on Lantus 25 units qpm. Will consider addition of AM Lantus tomorrow; holding for now as NPO for AM arteriogram.  - Continue 25 units Lantus - Continue SSI - Check daily blood sugars - Continue Neurontin 800mg  cap BID  Essential HTN. Pt's blood pressure has been stable in the 161-096'E systolic. Due to her Hx of kidney disease, consider starting an ACE-I although patient will be receiving contrast tomorrow and on 2 nephrotoxic antibiotics.  - Continue Clonidine  Hx of Dyslipidemia. Pt was on her home Simvastatin but will start high dose statin - Start 80mg  Atorvastatin  Dispo: Anticipated discharge in approximately 4-7 day(s).   [name], Medical Student 06/26/2018, 12:47 PM Pager: [phone]  Attestation for Student Documentation:  I re-performed the history, physical exam and medical decision-making activities of this service and have verified that the service and findings are accurately documented in the student's note.   Also patient with CKD, recent baseline Cr ~1.5-2. Certainly would benefit from nephroprotection however will not start ACE-I today due to impending contrast load and IV Vanc/zosyn. Another consideration would include Diltiazem or Verapamil as they have also demonstrated reduction in proteinuria. Also, given her CKD, could consider transitioning to a less nephrotoxic broad-spectrum empiric antibiotic regimen like Cefepime/Vanc (although no hx +MRSA screen in the past). Will go for angiography tomorrow morning. Rest per students  note.  Einar Gip, DO Internal Medicine PGY -3

## 2018-06-26 NOTE — Progress Notes (Signed)
Inpatient Diabetes Program Recommendations  AACE/ADA: New Consensus Statement on Inpatient Glycemic Control (2015)  Target Ranges:  Prepandial:   less than 140 mg/dL      Peak postprandial:   less than 180 mg/dL (1-2 hours)      Critically ill patients:  140 - 180 mg/dL   Results for ANYELA, NAPIERKOWSKI (MRN [id number]) as of 06/26/2018 12:10  Ref. Range 06/25/2018 11:25 06/25/2018 16:34 06/26/2018 00:41 06/26/2018 06:41 06/26/2018 11:45  Glucose-Capillary Latest Ref Range: 70 - 99 mg/dL 212 (H) 232 (H) 308 (H) 323 (H) 209 (H)   Review of Glycemic Control  Diabetes history: DM 2 Outpatient Diabetes medications: Lantus 20 units Qam, 30 units qpm, Novolog 0-30 units, <300 mg/dl 0 units, >300 mg/dl 15 units, Metformin 500 mg BID Current orders for Inpatient glycemic control: Lantus 25 units qhs, Novolog 0-15 units tid, Novolog 0-5 units qhs  Inpatient Diabetes Program Recommendations:    Glucose trends in the 300's. Patient takes basal insulin BID at home. Consider adding Lantus 15 units qam.  Thanks,  Tama Headings RN, MSN, BC-ADM Inpatient Diabetes Coordinator Team Pager [phone] (8a-5p)

## 2018-06-27 ENCOUNTER — Encounter (HOSPITAL_COMMUNITY)
Admission: EM | Discharge status: Against Medical Advice | Payer: Self-pay | Source: Home / Self Care | Attending: Internal Medicine

## 2018-06-27 ENCOUNTER — Encounter (HOSPITAL_COMMUNITY): Payer: Self-pay | Admitting: Surgery

## 2018-06-27 DIAGNOSIS — N183 Chronic kidney disease, stage 3 (moderate): Secondary | ICD-10-CM

## 2018-06-27 DIAGNOSIS — I70211 Atherosclerosis of native arteries of extremities with intermittent claudication, right leg: Secondary | ICD-10-CM

## 2018-06-27 DIAGNOSIS — E1122 Type 2 diabetes mellitus with diabetic chronic kidney disease: Secondary | ICD-10-CM

## 2018-06-27 DIAGNOSIS — I129 Hypertensive chronic kidney disease with stage 1 through stage 4 chronic kidney disease, or unspecified chronic kidney disease: Secondary | ICD-10-CM

## 2018-06-27 HISTORY — PX: PERIPHERAL VASCULAR ATHERECTOMY: CATH118256

## 2018-06-27 HISTORY — PX: ABDOMINAL AORTOGRAM W/LOWER EXTREMITY: CATH118223

## 2018-06-27 LAB — CBC
HEMATOCRIT: 23.2 % — AB (ref 36.0–46.0)
HEMOGLOBIN: 7.4 g/dL — AB (ref 12.0–15.0)
MCH: 30.3 pg (ref 26.0–34.0)
MCHC: 31.9 g/dL (ref 30.0–36.0)
MCV: 95.1 fL (ref 78.0–100.0)
Platelets: 176 10*3/uL (ref 150–400)
RBC: 2.44 MIL/uL — ABNORMAL LOW (ref 3.87–5.11)
RDW: 12.3 % (ref 11.5–15.5)
WBC: 7.5 10*3/uL (ref 4.0–10.5)

## 2018-06-27 LAB — GLUCOSE, CAPILLARY
GLUCOSE-CAPILLARY: 235 mg/dL — AB (ref 70–99)
GLUCOSE-CAPILLARY: 122 mg/dL — AB (ref 70–99)
Glucose-Capillary: 125 mg/dL — ABNORMAL HIGH (ref 70–99)
Glucose-Capillary: 111 mg/dL — ABNORMAL HIGH (ref 70–99)

## 2018-06-27 LAB — HEPARIN LEVEL (UNFRACTIONATED): HEPARIN UNFRACTIONATED: 0.34 [IU]/mL (ref 0.30–0.70)

## 2018-06-27 LAB — BASIC METABOLIC PANEL
ANION GAP: 10 (ref 5–15)
BUN: 19 mg/dL (ref 6–20)
CHLORIDE: 109 mmol/L (ref 98–111)
CO2: 20 mmol/L — ABNORMAL LOW (ref 22–32)
Calcium: 8.2 mg/dL — ABNORMAL LOW (ref 8.9–10.3)
Creatinine, Ser: 2.16 mg/dL — ABNORMAL HIGH (ref 0.44–1.00)
GFR calc non Af Amer: 28 mL/min — ABNORMAL LOW (ref >60)
GFR, EST AFRICAN AMERICAN: 32 mL/min — AB (ref >60)
Glucose, Bld: 228 mg/dL — ABNORMAL HIGH (ref 70–99)
Potassium: 4.5 mmol/L (ref 3.5–5.1)
Sodium: 139 mmol/L (ref 135–145)

## 2018-06-27 LAB — POCT ACTIVATED CLOTTING TIME: ACTIVATED CLOTTING TIME: 230 s

## 2018-06-27 SURGERY — ABDOMINAL AORTOGRAM W/LOWER EXTREMITY
Anesthesia: LOCAL | Laterality: Right

## 2018-06-27 MED ORDER — INSULIN ASPART 100 UNIT/ML ~~LOC~~ SOLN: 0.0000 [IU] | Freq: Every day | SUBCUTANEOUS | Status: DC

## 2018-06-27 MED ORDER — MIDAZOLAM HCL 2 MG/2ML IJ SOLN
INTRAMUSCULAR | Status: AC
  Filled 2018-06-27: qty 2

## 2018-06-27 MED ORDER — SODIUM CHLORIDE 0.9 % IV SOLN: 250.0000 mL | INTRAVENOUS | Status: DC | PRN

## 2018-06-27 MED ORDER — INSULIN ASPART 100 UNIT/ML ~~LOC~~ SOLN
0.0000 [IU] | Freq: Three times a day (TID) | SUBCUTANEOUS | Status: DC
  Administered 2018-06-27 – 2018-06-28 (×2): 3 [IU] via SUBCUTANEOUS
  Administered 2018-06-29 (×2): 4 [IU] via SUBCUTANEOUS
  Administered 2018-06-30 (×2): 7 [IU] via SUBCUTANEOUS
  Administered 2018-07-01 (×2): 4 [IU] via SUBCUTANEOUS
  Administered 2018-07-01: 3 [IU] via SUBCUTANEOUS
  Administered 2018-07-02 – 2018-07-03 (×3): 4 [IU] via SUBCUTANEOUS

## 2018-06-27 MED ORDER — SODIUM CHLORIDE 0.9 % WEIGHT BASED INFUSION: 1.0000 mL/kg/h | INTRAVENOUS | Status: AC

## 2018-06-27 MED ORDER — HYDROMORPHONE HCL 1 MG/ML IJ SOLN: 0.1500 mg | Freq: Once | INTRAMUSCULAR | Status: DC

## 2018-06-27 MED ORDER — HEPARIN (PORCINE) IN NACL 1000-0.9 UT/500ML-% IV SOLN
INTRAVENOUS | Status: DC | PRN
  Administered 2018-06-27 (×2): 500 mL

## 2018-06-27 MED ORDER — MIDAZOLAM HCL 2 MG/2ML IJ SOLN
INTRAMUSCULAR | Status: DC | PRN
  Administered 2018-06-27 (×2): 1 mg via INTRAVENOUS
  Administered 2018-06-27 (×2): 2 mg via INTRAVENOUS

## 2018-06-27 MED ORDER — LIDOCAINE 5 % EX PTCH
1.0000 | MEDICATED_PATCH | CUTANEOUS | Status: DC
  Administered 2018-06-27 – 2018-06-29 (×3): 1 via TRANSDERMAL
  Filled 2018-06-27 (×4): qty 1

## 2018-06-27 MED ORDER — HEPARIN SODIUM (PORCINE) 1000 UNIT/ML IJ SOLN
INTRAMUSCULAR | Status: DC | PRN
  Administered 2018-06-27: 7000 [IU] via INTRAVENOUS

## 2018-06-27 MED ORDER — SODIUM CHLORIDE 0.9 % IV SOLN
2.0000 g | Freq: Every day | INTRAVENOUS | Status: DC
  Administered 2018-06-28: 2 g via INTRAVENOUS
  Filled 2018-06-27 (×3): qty 2

## 2018-06-27 MED ORDER — HYDROMORPHONE HCL 1 MG/ML IJ SOLN
1.0000 mg | INTRAMUSCULAR | Status: DC | PRN
  Administered 2018-06-27 – 2018-06-30 (×16): 1 mg via INTRAVENOUS
  Filled 2018-06-27 (×18): qty 1

## 2018-06-27 MED ORDER — IODIXANOL 320 MG/ML IV SOLN
INTRAVENOUS | Status: DC | PRN
  Administered 2018-06-27: 40 mL via INTRA_ARTERIAL

## 2018-06-27 MED ORDER — HEPARIN SODIUM (PORCINE) 1000 UNIT/ML IJ SOLN
INTRAMUSCULAR | Status: AC
  Filled 2018-06-27: qty 1

## 2018-06-27 MED ORDER — LIDOCAINE HCL (PF) 1 % IJ SOLN
INTRAMUSCULAR | Status: AC
  Filled 2018-06-27: qty 30

## 2018-06-27 MED ORDER — HEPARIN (PORCINE) IN NACL 1000-0.9 UT/500ML-% IV SOLN
INTRAVENOUS | Status: AC
  Filled 2018-06-27: qty 500

## 2018-06-27 MED ORDER — SODIUM CHLORIDE 0.9% FLUSH
3.0000 mL | INTRAVENOUS | Status: DC | PRN
  Administered 2018-06-30: 3 mL via INTRAVENOUS
  Filled 2018-06-27: qty 3

## 2018-06-27 MED ORDER — INSULIN GLARGINE 100 UNIT/ML ~~LOC~~ SOLN
30.0000 [IU] | Freq: Every day | SUBCUTANEOUS | Status: DC
  Administered 2018-06-30 – 2018-07-02 (×2): 30 [IU] via SUBCUTANEOUS
  Filled 2018-06-27 (×6): qty 0.3

## 2018-06-27 MED ORDER — FENTANYL CITRATE (PF) 100 MCG/2ML IJ SOLN
INTRAMUSCULAR | Status: AC
  Filled 2018-06-27: qty 2

## 2018-06-27 MED ORDER — LIDOCAINE HCL (PF) 1 % IJ SOLN
INTRAMUSCULAR | Status: DC | PRN
  Administered 2018-06-27: 20 mL

## 2018-06-27 MED ORDER — LABETALOL HCL 5 MG/ML IV SOLN: 10.0000 mg | INTRAVENOUS | Status: DC | PRN

## 2018-06-27 MED ORDER — FENTANYL CITRATE (PF) 100 MCG/2ML IJ SOLN
INTRAMUSCULAR | Status: DC | PRN
  Administered 2018-06-27 (×2): 50 ug via INTRAVENOUS
  Administered 2018-06-27 (×2): 25 ug via INTRAVENOUS

## 2018-06-27 MED ORDER — SODIUM CHLORIDE 0.9% FLUSH
3.0000 mL | Freq: Two times a day (BID) | INTRAVENOUS | Status: DC
  Administered 2018-06-27 – 2018-07-03 (×11): 3 mL via INTRAVENOUS

## 2018-06-27 MED ORDER — ONDANSETRON HCL 4 MG/2ML IJ SOLN
4.0000 mg | Freq: Four times a day (QID) | INTRAMUSCULAR | Status: DC | PRN
  Administered 2018-06-30 (×3): 4 mg via INTRAVENOUS
  Filled 2018-06-27 (×3): qty 2

## 2018-06-27 MED ORDER — HYDRALAZINE HCL 20 MG/ML IJ SOLN: 5.0000 mg | INTRAMUSCULAR | Status: DC | PRN

## 2018-06-27 SURGICAL SUPPLY — 23 items
BALLN IN.PACT DCB 5X120 (BALLOONS) ×3
BUR JETSTREAM XC 2.1/3.0 (BURR) ×2 IMPLANT
BURR JETSTREAM XC 2.1/3.0 (BURR) ×3
CATH OMNI FLUSH 5F 65CM (CATHETERS) ×3 IMPLANT
DCB IN.PACT 5X120 (BALLOONS) ×2 IMPLANT
DEVICE CLOSURE MYNXGRIP 6/7F (Vascular Products) ×3 IMPLANT
FILTER CO2 0.2 MICRON (VASCULAR PRODUCTS) ×3 IMPLANT
KIT ENCORE 26 ADVANTAGE (KITS) ×3 IMPLANT
KIT MICROPUNCTURE NIT STIFF (SHEATH) ×3 IMPLANT
KIT PV (KITS) ×3 IMPLANT
RESERVOIR CO2 (VASCULAR PRODUCTS) ×3 IMPLANT
SET FLUSH CO2 (MISCELLANEOUS) ×3 IMPLANT
SHEATH PINNACLE 5F 10CM (SHEATH) ×3 IMPLANT
SHEATH PINNACLE 7F 10CM (SHEATH) ×3 IMPLANT
SHEATH PINNACLE ST 7F 45CM (SHEATH) ×3 IMPLANT
SHIELD RADPAD SCOOP 12X17 (MISCELLANEOUS) ×3 IMPLANT
SYR MEDRAD MARK V 150ML (SYRINGE) ×3 IMPLANT
TAPE VIPERTRACK RADIOPAQ (MISCELLANEOUS) ×2 IMPLANT
TAPE VIPERTRACK RADIOPAQUE (MISCELLANEOUS) ×3
TRANSDUCER W/STOPCOCK (MISCELLANEOUS) ×3 IMPLANT
TRAY PV CATH (CUSTOM PROCEDURE TRAY) ×3 IMPLANT
WIRE BENTSON .035X145CM (WIRE) ×3 IMPLANT
WIRE SPARTACORE .014X300CM (WIRE) ×3 IMPLANT

## 2018-06-27 NOTE — Op Note (Signed)
Patient name: Kathy Phelps MRN: [id number] DOB: Aug 02, 1981 Sex: female  06/27/2018 Pre-operative Diagnosis: Right leg discoloration Post-operative diagnosis:  Same Surgeon:  Annamarie Major Procedure Performed:  1.  Ultrasound-guided access, left femoral artery  2.  Abdominal aortogram with CO2  3.  Right lower extremity runoff  4.  Atherectomy with drug-coated balloon angioplasty, right popliteal artery  5.  Conscious sedation (67 minutes)  6.  Closure device (Mynx)    Indications: The patient has poorly controlled diabetes and has recently undergone amputation of the left foot.  She comes in with discoloration to the toes on her right foot and a nonpalpable popliteal pulse.  She is here for arteriogram and possible intervention.  Procedure:  The patient was identified in the holding area and taken to room 8.  The patient was then placed supine on the table and prepped and draped in the usual sterile fashion.  A time out was called.  Conscious sedation was administered with the use of IV fentanyl and Versed under continuous physician and nurse monitoring.  Heart rate, blood pressure, and oxygen saturation were continuously monitored.  Ultrasound was used to evaluate the left common femoral artery.  It was patent .  A digital ultrasound image was acquired.  A micropuncture needle was used to access the left common femoral artery under ultrasound guidance.  An 018 wire was advanced without resistance and a micropuncture sheath was placed.  The 018 wire was removed and a benson wire was placed.  The micropuncture sheath was exchanged for a 5 french sheath.  An omniflush catheter was advanced over the wire to the level of L-1.  An abdominal angiogram with CO2 was obtained.  Next, using the omniflush catheter and a benson wire, the aortic bifurcation was crossed and the catheter was placed into theright external iliac artery and right runoff with CO2 was obtained.    Findings:   Aortogram: No  renal artery stenosis.  The infrarenal abdominal aorta and bilateral common and external iliac arteries are widely patent  Right Lower Extremity: Right common femoral and profunda femoral artery widely patent to the superficial femoral artery is widely patent.  Beginning at the level of the adductor canal down to the joint space, there is diffuse luminal irregularity and then a focal, 80% lesion.  The below-knee popliteal artery is patent throughout its course and there is three-vessel runoff.  Left Lower Extremity: Not evaluated  Intervention: After the above images were acquired, the decision was made to proceed with intervention.  A 7 French 45 cm sheath was advanced over the wire placed in the right external iliac artery.  The patient was then fully heparinized.  I used a 1 4 [place] core wire to easily cross the lesion.  The wire was placed into the peroneal artery.  A jetstream device was used to perform atherectomy of the popliteal artery.  I made one pass with the blades down and then 1 past with the blades up.  I then used a Impact Admiral drug-coated 5 x 150 balloon and performed balloon angioplasty at 8 atm for 2 minutes.  Follow-up imaging revealed a widely patent site of intervention.  There was some spasm distally and into the peroneal artery.  The runoff was otherwise unchanged.  At this point, the long sheath was exchanged out for a short 7 French sheath and a Mynx closure device was used without issue.  Impression:  #1 high-grade lesion in the right above-knee popliteal artery as well  as diffuse luminal irregularity from the adductor canal down to the joint space.  This was treated with jetstream atherectomy and drug-coated balloon angioplasty using a Impact Admiral 5 x 150  #2 total of 20 cc of contrast was utilized   V. Annamarie Major, M.D. [facility] Office: [phone] Pager:  [phone]

## 2018-06-27 NOTE — Plan of Care (Signed)
  Problem: Clinical Measurements: Goal: Respiratory complications will improve Outcome: Progressing Goal: Cardiovascular complication will be avoided Outcome: Progressing   Problem: Activity: Goal: Risk for activity intolerance will decrease Outcome: Progressing   Problem: Nutrition: Goal: Adequate nutrition will be maintained Outcome: Progressing   Problem: Coping: Goal: Level of anxiety will decrease Outcome: Progressing   Problem: Elimination: Goal: Will not experience complications related to bowel motility Outcome: Progressing Goal: Will not experience complications related to urinary retention Outcome: Progressing   Problem: Skin Integrity: Goal: Risk for impaired skin integrity will decrease Outcome: Progressing   Problem: Education: Goal: Knowledge of General Education information will improve Description Including pain rating scale, medication(s)/side effects and non-pharmacologic comfort measures Outcome: Not Progressing   Problem: Health Behavior/Discharge Planning: Goal: Ability to manage health-related needs will improve Outcome: Not Progressing   Problem: Clinical Measurements: Goal: Ability to maintain clinical measurements within normal limits will improve Outcome: Not Progressing Goal: Will remain free from infection Outcome: Not Progressing

## 2018-06-27 NOTE — Progress Notes (Signed)
Patient to unit from PACU in stable condition. Oriented to room and unit, CHG bath given, telemetry applied and CCMD notified. Daughter at bedside.

## 2018-06-27 NOTE — Progress Notes (Signed)
Received order for 1 mg dilaudid q 2 hours PRN per Dr. Scot Dock. Will continue to monitor

## 2018-06-27 NOTE — Progress Notes (Addendum)
Subjective:  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 Currently hospital day 3  I have evaluated and examined [name] this morning. She reports her RLE pain is under control and currently a 6/10; however, she is still experiencing pain to her L flank. She states she used 4 of her thick pads (2/2 menorrhagia), still has mild nausea, but denies any new complaints. We updated her of the plans to manage her L flank pain w/ a lidocaine patch along w/ the Percocet and to await for the arteriogram later today.  Objective:  Vital signs in last 24 hours: Vitals:   06/27/18 1225 06/27/18 1240 06/27/18 1255 06/27/18 1310  BP: 116/73 138/75 140/90 (!) 142/85  Pulse: 70 76 70 70  Resp: 11 (!) 21 13 11   Temp:      TempSrc:      SpO2: 95% 100% 100% 100%  Weight:      Height:       Physical Exam  Constitutional: She is oriented to person, place, and time.  Caucasian female in moderate distress secondary to L flank pain. Cooperative throughout the exam  HENT:  Head: Normocephalic and atraumatic.  Eyes: Conjunctivae and EOM are normal.  Neck: Normal range of motion. Neck supple.  Cardiovascular: Normal rate, regular rhythm and normal heart sounds. Exam reveals no gallop and no friction rub.  No murmur heard. Pulses:      Radial pulses are 2+ on the right side, and 2+ on the left side.       Popliteal pulses are 0 on the right side.       Dorsalis pedis pulses are 0 on the right side.       Posterior tibial pulses are 2+ on the right side.  Pulmonary/Chest: Effort normal and breath sounds normal. No respiratory distress. She has no wheezes. She has no rales.  Abdominal: Soft. Bowel sounds are normal. She exhibits distension (secondary to body habitus). There  is no tenderness. There is no rebound and no guarding.  Musculoskeletal:  L BKA with clean dressing in place w/ no drainage noted. RLE with severe tenderness to palpation superior to the malleolus. Faint streaking erythema appreciated from the dorsum to the anterior mid shin which appears improved. Purple discoloration still appreciated on the R toes with possible spread to the 4th toe. R foot feels warm to touch while the lower leg is still cool. Moderate L CVA tenderness to palpation  Neurological: She is alert and oriented to person, place, and time.  Decreased sensation to the RLE from the thigh distally with absent sensation to the foot. This remains unchanged from previous day   Assessment/Plan:  Principal Problem:   Lower limb ischemia Active Problems:   Type II diabetes mellitus with renal manifestations, uncontrolled (HCC)   Tobacco abuse   Essential hypertension   AKI (acute kidney injury) ([place])   Cellulitis   Lymphangitis   Menorrhagia  This is a 37 y.o Caucasian Female w/ a PMHx of uncontrolled T2DM, 25 pack year 1ppd smoker, HTN & dyslipidemia presenting w/ RLE ischemia with associated lymphangitis. Currently hospital day3  RLE ischemia w/ Lymphangitis & Cellulitis. Lymphangitis continues to improve w/ Vanc & Cefepime (Zosyn transitioned to cefepime today). We encouraged her to use more of  the Percocet for her pain since she can get it up to 6x/day. RLE Arteriogram revealed 80% stenosis in the R popliteal artery so Dr Trula Slade performed a jetstream atherectomy and balloon angioplasty. > MRSA Screening, Will consider further de-escalation of Anitbiotics, Likely for total of ~7days - Appreciate Vasc Sg recommendations - Continue Vanc and Cefepime - Continue Heparin 1400 unit/hr - Continue Percocet  L back pain. She reports the L back pain wraps to the side of her body. Initial CT of the abd/pelvis was unremarkable. We encouraged her to take more of her Percocet as she can get  it up to 6x/day but only asked for it 3x yesterday. - Start Lidocaine patch - Continue Percocet q4h  Acute Menorrhagia. Pt reports she soaked through 4 thick pads overnight, but in my evaluation, she did not exhibit any significant pallor. HgB dropped from 8 yesterday to 7.4 today but Heparin level still stable at 0.34 units/ml. In case HgB continues to drop, we have already type and screened her and are ready to transfuse. Due to her smoking Hx, I am not inclined to start her on any meds to prevent her bleeding such as an OCP, Megace or Amicar. I will instead have her f/u w/ her outpt OB/GYN for further evaluation - Continue to monitor CBC - Continue to monitor Heparin level  ?AKI CKDIII: Patient presented with Cr 2.25 (Uclear baseline, likely 1.5-2) in the setting of decreased PO intake. Recent Cr close to 2; unclear if 2/2 to AKI at that time or worsening baseline. Patient receiving IVF with Cr stable around 2.1-2.2. - Continue IVF - AM BMP  Uncontrolled T2DM, insulin dependent, w/ neuropathy. BS was in the 200 range overnight while on Lantus 25 units qpm, but has gone down to 122 as of noon today. Will re-check blood sugar once she gets back from her arteriogram. Will increase Lantus given previous highs while eating. - 30 units Lantus - Continue SSI - Check daily blood sugars - Continue Neurontin 800mg  cap BID  Essential HTN. Pt's blood pressure has been stable in the 332-951'O systolic. Due to her Hx of kidney disease, consider starting an ACE-I in the future. - Continue Clonidine  Hx of Dyslipidemia. - Start 80mg  Atorvastatin  Dispo: Anticipated discharge in approximately 2-4 day(s).    [name], Medical Student 06/27/2018, 1:19 PM Pager: [phone]  Attestation for Student Documentation:  I personally was present and performed or re-performed the history, physical exam and medical decision-making activities of this service and have verified that the service and  findings are accurately documented in the student's note with some additions/corrections.  [name], MD 06/27/2018, 1:31 PM

## 2018-06-27 NOTE — Progress Notes (Addendum)
I was checking in with [name] after her arteriogram. During the encounter, I informed her of the procedure details of the 80% blockage in her R popliteal artery and the subsequent atherectomy & balloon angioplasty performed by the vascular surgeon. She informed me that she was still experiencing the same pain w/ worsening of the purple discoloration of her R 4th toe. She was very tearful and upset that the same cardiovascular surgery group who operated on her LLE performed the procedure on her RLE during this admission and that everyone was telling her the same story as to why her leg was progressing the way it was. She reported to me that "if I had known that the same group was going to operate on my leg, then I wouldn't have proceeded with the procedure." She stated to me that she wanted to go smoke to lessen her frustration and that if she could not go smoke, then she would leave AMA. I informed her that I would speak with the nurses and my upper level resident. As I got out of the room, the tech informed me that the pt had been cursing at her on numerous occasions due to her frustration. Dr. Trilby Drummer finally entered the room and explained to her the natural progression of her disease and why her leg was still painful and her 4th toe still being purple. She explained to Dr. Trilby Drummer the same thing she told me as to why she was upset.   After speaking to the nursing staff, it became clear that leaving the unit would cause significant stain on their resources as patient is post op and would have to be accompanied by staff in addition to possibly causing issues with policy. Patient was informed of this and told that this is a non-smoking facility, so we would need her to do her best to hold off on smoking. She was also informed that their could be significant risk to leaving before her treatment and work-up was complete, so this would be the last thing we would want for her to do. She was agreeable not to leave  for now. She was offered additional anxiety medication, but stated she has not gotten her home medication as regularly as she does at home, she was told we would look into this and adjust her medication as needed. - R 4th toe marked at edge of discoloration to monitor for progression  [name], Medical Student 06/27/18, 4:40pm Pager: [phone]  Attestation for Student Documentation:  I personally was present and performed or re-performed the history, physical exam and medical decision-making activities of this service and have verified that the service and findings are accurately documented in the student's note with some additions/corrections.  [name], MD 06/27/2018, 4:53 PM

## 2018-06-27 NOTE — H&P (Deleted)
Subjective:  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 Currently hospital day 3  I have evaluated and examined Kathy Phelps this morning. She reports her RLE pain is under control and currently a 6/10; however, she is still experiencing excruciating pain to her L flank. She states she soaked through 4 of her thick pads, still has mild nausea, but denies any new complaints. We updated her of the plans to manage her L flank pain w/ a lidocaine patch along w/ the Percocet and to await for the arteriogram later today.  Objective:  Vital signs in last 24 hours: Vitals:   06/27/18 1118 06/27/18 1123 06/27/18 1128 06/27/18 1133  BP: (!) 142/93 135/88 132/86 134/84  Pulse: 74 69 70 71  Resp: 15 19 11  (!) 55  Temp:      TempSrc:      SpO2: 100% 100% 100% 100%  Weight:      Height:       Physical Exam  Constitutional: She is oriented to person, place, and time.  Caucasian female in moderate distress secondary to L flank pain. Cooperative throughout the exam  HENT:  Head: Normocephalic and atraumatic.  Eyes: Conjunctivae and EOM are normal.  Neck: Normal range of motion. Neck supple.  Cardiovascular: Normal rate, regular rhythm and normal heart sounds. Exam reveals no gallop and no friction rub.  No murmur heard. Pulses:      Radial pulses are 2+ on the right side, and 2+ on the left side.       Popliteal pulses are 0 on the right side.       Dorsalis pedis pulses are 0 on the right side.       Posterior tibial pulses are 2+ on the right side.  Pulmonary/Chest: Effort normal and breath sounds normal. No respiratory distress. She has no wheezes. She has no rales.  Abdominal: Soft. Bowel sounds are normal. She exhibits distension (secondary to body habitus).  There is no tenderness. There is no rebound and no guarding.  Musculoskeletal:  L BKA with clean dressing in place w/ no drainage noted. RLE with severe tenderness to palpation superior to the malleolus. Faint streaking erythema appreciated from the dorsum to the anterior mid shin which appears improved. Purple discoloration still appreciated on the R toes with possible spread to the 4th toe. R foot feels warm to touch while the lower leg is still cool. Moderate L CVA tenderness to palpation  Neurological: She is alert and oriented to person, place, and time.  Decreased sensation to the RLE from the thigh distally with absent sensation to the foot. This remains unchanged from previous day    Assessment/Plan:  Principal Problem:   Lower limb ischemia Active Problems:   Type II diabetes mellitus with renal manifestations, uncontrolled (HCC)   Tobacco abuse   Essential hypertension   AKI (acute kidney injury) ([place])   Cellulitis   Lymphangitis   Menorrhagia  This is a 37 y.o Caucasian Female w/ a PMHx of uncontrolled T2DM, 25 pack year 1ppd smoker, HTN & dyslipidemia presenting w/ RLE ischemia with associated lymphangitis. Currently hospital day 3  RLE ischemia w/ Lymphangitis & Cellulitis. Lymphangitis continues to improve w/ Vanc & Cefipime. We encouraged her to use more of the Percocet for  her pain since she can get it up to 6x/day. RLE Arteriogram revealed 80% stenosis in the R popliteal artery so Dr Trula Slade performed a jetstream atherectomy and balloon angioplasty. - Await Vasc Sg recommendations - Continue Heparin 1400 unit/hr - Continue Percocet  L back pain. She reports the L back pain wraps to the side of her body. Initial CT of the abd/pelvis was unremarkable. We encouraged her to take more of her Percocet as she can get it up to 6x/day but only asked for it 3x yesterday. - Start Lidocaine patch - Continue Percocet q4h  Acute Menorrhagia. Pt reports she soaked through 4 thick  pads overnight, but in my evaluation, she did not exhibit any significant pallor. HgB dropped from 8 yesterday to 7.4 today but Heparin level still stable at 0.34 units/ml. In case HgB continues to drop, we have already type and screened her and are ready to transfuse. Due to her smoking Hx, I am inclined to start her on any meds to prevent her bleeding such as an OCP, Megace or Amicar. I will instead have her f/u w/ her outpt OB/GYN for further evaluation - Continue to monitor CBC - Continue to monitor Heparin level  Uncontrolled T2DM, insulin dependent, w/ neuropathy. BS was in the 200 range overnight while on Lantus 25 units qpm, but has gone down to 122 as of noon today. Will re-check blood sugar once she gets back from her arteriogram and consider addition of AM Lantus tomorrow  - Continue 25 units Lantus - Continue SSI - Check daily blood sugars - Continue Neurontin 800mg  cap BID  Essential HTN. Pt's blood pressure has been stable in the 644-034'V systolic. Due to her Hx of kidney disease, consider starting an ACE-I in the future.  - Continue Clonidine  Hx of Dyslipidemia. - Start 80mg  Atorvastatin  Dispo: Anticipated discharge in approximately 2-4 day(s).   [name], Medical Student 06/27/2018, 11:52 AM Pager: [phone]

## 2018-06-27 NOTE — Progress Notes (Signed)
  Date: 06/27/2018  Patient name: Kathy Phelps  Medical record number: [id number]  Date of birth: [birth year]   I have seen and evaluated this patient and I have discussed the plan of care with the house staff. Please see their note for complete details. I concur with their findings with the following additions/corrections: [name] was seen on team AM rounds. She is now s/p angiogram with arthrectomy and drug coated balloon angioplasty of the above knee pop artery. Today, she c/o L sided abd pain which started suddenly while sitting at reset a few days prior to admission. It was intermittent then but is now constant and more severe than her R ischemic foot. UA had > 50 RBC but CT neg renal stones, osseous abnl, or other etiology. Cr up from  Baseline but stable around 2.1 past 4 days. No leukocytosis. LFT nl on admit. No fever. It is inf to rib cage, radicular, no ecchymosis. Tender to palp. HgB baseline about 11, 8.8 on admit, now 7.4. Will review CT with radiology to see if small retroperitoneal bleed present which might have expanded with IV heparin. If not likely cause, agree with MS cause and symptomatic tx.   Bartholomew Crews, MD 06/27/2018, 1:37 PM

## 2018-06-27 NOTE — Progress Notes (Signed)
Kathy Phelps for Heparin Indication: ischemic limb  Allergies  Allergen Reactions  . Hydrocodone-Acetaminophen Hives  . Tylenol [Acetaminophen] Itching and Swelling    Patient Measurements: Height: 5\' 9"  (175.3 cm) Weight: 160 lb 0.9 oz (72.6 kg) IBW/kg (Calculated) : 66.2  Vital Signs: Temp: 97.9 F (36.6 C) (09/17 1330) Temp Source: Oral (09/17 1330) BP: 142/79 (09/17 1330) Pulse Rate: 93 (09/17 1330)  Labs: Recent Labs    06/25/18 0129 06/25/18 0557  06/25/18 2117 06/26/18 0357 06/27/18 0502  HGB 8.8*  --   --   --  8.0* 7.4*  HCT 26.9*  --   --   --  24.8* 23.2*  PLT 232  --   --   --  184 176  LABPROT  --  14.7  --   --   --   --   INR  --  1.16  --   --   --   --   HEPARINUNFRC  --   --    < > 0.50 0.33 0.34  CREATININE 2.25*  --   --   --  2.07* 2.16*   < > = values in this interval not displayed.    Estimated Creatinine Clearance: 36.5 mL/min (A) (by C-G formula based on SCr of 2.16 mg/dL (H)).   Medical History: Past Medical History:  Diagnosis Date  . Anxiety   . Asthma   . Bipolar affective ([place])   . Complete miscarriage   . Constipation   . 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 Depression   . Diabetes mellitus    Type II  . Dyspnea     " only at night and I stop breathing in my sleep too for about the last three weeks"  . GERD (gastroesophageal reflux disease)   . Headache    "migraraines"  . Hyperlipidemia   . Hypertension   . Osteomyelitis ([place])    left foot  . Pregnancy complication    HELP Syndrome  . Renal disorder   . Schizophrenia ([employer] [facility])   . Stroke [facility])    " mild stroke", memory loss- approx 2017    Medications:  No current facility-administered medications on file prior to encounter.    Current Outpatient Medications on File Prior to Encounter  Medication Sig Dispense Refill  . ADVAIR DISKUS 100-50 MCG/DOSE AEPB Inhale 1 puff into the lungs daily.  5  . alprazolam  (XANAX) 2 MG tablet Take 1 tablet (2 mg total) by mouth 4 (four) times daily as needed for sleep. (Patient taking differently: Take 2 mg by mouth 5 (five) times daily. ) 30 tablet 0  . busPIRone (BUSPAR) 15 MG tablet Take 15 mg by mouth 3 (three) times daily.  1  . cloNIDine (CATAPRES) 0.1 MG tablet Take 0.1 mg by mouth 2 (two) times daily.  0  . clopidogrel (PLAVIX) 75 MG tablet Take 1 tablet (75 mg total) by mouth daily with breakfast. 30 tablet 3  . DULoxetine (CYMBALTA) 30 MG capsule Take 90 mg by mouth every morning.  1  . famotidine (PEPCID) 20 MG tablet Take 1 tablet (20 mg total) by mouth 2 (two) times daily. (Patient taking differently: Take 20 mg by mouth daily. ) 60 tablet 0  . gabapentin (NEURONTIN) 800 MG tablet Take 1 tablet (800 mg total) by mouth 2 (two) times daily. (Patient taking differently: Take 800 mg by mouth 5 (five) times daily. ) 60 tablet  0  . insulin glargine (LANTUS) 100 UNIT/ML injection Inject 0.2-0.3 mLs (20-30 Units total) into the skin 2 (two) times daily. 20 U in the morning and 30 U in the evening (Patient taking differently: Inject 30 Units into the skin 2 (two) times daily. ) 2 vial 0  . LATUDA 120 MG TABS Take 120 mg by mouth at bedtime.  1  . metFORMIN (GLUCOPHAGE) 1000 MG tablet Take 1 tablet (1,000 mg total) by mouth 2 (two) times daily. (Patient taking differently: Take 500 mg by mouth 2 (two) times daily with a meal. ) 30 tablet 0  . metoprolol tartrate (LOPRESSOR) 25 MG tablet Take 0.5 tablets (12.5 mg total) by mouth 2 (two) times daily. 60 tablet 0  . mirtazapine (REMERON) 15 MG tablet Take 15 mg by mouth at bedtime.  0  . naproxen (NAPROSYN) 500 MG tablet Take 500 mg by mouth 2 (two) times daily.  0  . NOVOLOG 100 UNIT/ML injection Inject 0-30 Units into the skin 3 (three) times daily with meals. Sliding Scale:  >300 15 units < 300 do not use 10 mL 3  . pantoprazole (PROTONIX) 40 MG tablet Take 1 tablet (40 mg total) by mouth daily. 30 tablet 0  . PROAIR  HFA 108 (90 BASE) MCG/ACT inhaler Inhale 2 puffs into the lungs every 6 (six) hours as needed for shortness of breath.   0  . silver sulfADIAZINE (SILVADENE) 1 % cream Apply 1 application topically daily. 50 g 0  . simvastatin (ZOCOR) 40 MG tablet Take 40 mg by mouth every evening.   2  . zolpidem (AMBIEN) 10 MG tablet Take 10 mg by mouth at bedtime as needed for sleep.  2  . Insulin Syringe-Needle U-100 (INSULIN SYRINGE .5CC/31GX5/16") 31G X 5/16" 0.5 ML MISC 1 each by Does not apply route QID. 100 each 1  . metoCLOPramide (REGLAN) 5 MG tablet Take 1 tablet (5 mg total) by mouth 3 (three) times daily before meals. (Patient not taking: Reported on 06/25/2018) 90 tablet 0  . nicotine (NICODERM CQ - DOSED IN MG/24 HOURS) 21 mg/24hr patch Place 1 patch (21 mg total) onto the skin daily. (Patient not taking: Reported on 06/25/2018) 28 patch 0  . nitroGLYCERIN (NITRO-DUR) 0.2 mg/hr patch Place 1 patch (0.2 mg total) onto the skin daily. (Patient not taking: Reported on 06/25/2018) 30 patch 12  . oxyCODONE-acetaminophen (PERCOCET) 10-325 MG tablet Take 1 tablet by mouth every 6 (six) hours as needed for pain. (Patient not taking: Reported on 06/25/2018) 30 tablet 0  . oxyCODONE-acetaminophen (PERCOCET) 10-325 MG tablet Take 1 tablet by mouth every 8 (eight) hours as needed for pain. (Patient not taking: Reported on 06/25/2018) 40 tablet 0  . pentoxifylline (TRENTAL) 400 MG CR tablet Take 1 tablet (400 mg total) by mouth 3 (three) times daily with meals. (Patient not taking: Reported on 06/25/2018) 90 tablet 1     Assessment: 37 y.o. female with RLE ischemia on IV heparin. Not on anticoag PTA.  Heparin level remains therapeutic at 0.34 on 1400 units/hr. Hgb 7.4, platelets are within normal limits. No s/sx of bleeding - besides menstrual cycle bleeding, monitor. No infusion issues.   Goal of Therapy:  Heparin level 0.3-0.7 units/ml Monitor platelets by anticoagulation protocol: Yes   Plan:  Continue heparin  at 1400 units/hr Monitor daily HL, CBC, and signs/symptoms of bleeding   Jood Retana A. Levada Dy, PharmD, [place] Pager: [phone] Please utilize Amion for appropriate phone number to reach the  unit pharmacist ([place])   06/27/2018      1:38 PM

## 2018-06-27 NOTE — Interval H&P Note (Signed)
History and Physical Interval Note:  06/27/2018 10:14 AM  Kathy Phelps  has presented today for surgery, with the diagnosis of claudication  The various methods of treatment have been discussed with the patient and family. After consideration of risks, benefits and other options for treatment, the patient has consented to  Procedure(s): ABDOMINAL AORTOGRAM W/LOWER EXTREMITY (N/A) as a surgical intervention .  The patient's history has been reviewed, patient examined, no change in status, stable for surgery.  I have reviewed the patient's chart and labs.  Questions were answered to the patient's satisfaction.     Annamarie Major

## 2018-06-28 DIAGNOSIS — Z9862 Peripheral vascular angioplasty status: Secondary | ICD-10-CM

## 2018-06-28 LAB — BASIC METABOLIC PANEL
Anion gap: 8 (ref 5–15)
BUN: 13 mg/dL (ref 6–20)
CALCIUM: 8.1 mg/dL — AB (ref 8.9–10.3)
CO2: 21 mmol/L — AB (ref 22–32)
CREATININE: 1.81 mg/dL — AB (ref 0.44–1.00)
Chloride: 110 mmol/L (ref 98–111)
GFR calc Af Amer: 40 mL/min — ABNORMAL LOW (ref >60)
GFR calc non Af Amer: 34 mL/min — ABNORMAL LOW (ref >60)
Glucose, Bld: 123 mg/dL — ABNORMAL HIGH (ref 70–99)
Potassium: 4.3 mmol/L (ref 3.5–5.1)
Sodium: 139 mmol/L (ref 135–145)

## 2018-06-28 LAB — CBC
HEMATOCRIT: 24.6 % — AB (ref 36.0–46.0)
Hemoglobin: 7.8 g/dL — ABNORMAL LOW (ref 12.0–15.0)
MCH: 30.4 pg (ref 26.0–34.0)
MCHC: 31.7 g/dL (ref 30.0–36.0)
MCV: 95.7 fL (ref 78.0–100.0)
Platelets: 172 10*3/uL (ref 150–400)
RBC: 2.57 MIL/uL — ABNORMAL LOW (ref 3.87–5.11)
RDW: 12.3 % (ref 11.5–15.5)
WBC: 7.8 10*3/uL (ref 4.0–10.5)

## 2018-06-28 LAB — GLUCOSE, CAPILLARY
Glucose-Capillary: 147 mg/dL — ABNORMAL HIGH (ref 70–99)
Glucose-Capillary: 143 mg/dL — ABNORMAL HIGH (ref 70–99)
Glucose-Capillary: 104 mg/dL — ABNORMAL HIGH (ref 70–99)

## 2018-06-28 LAB — MRSA PCR SCREENING: MRSA by PCR: NEGATIVE

## 2018-06-28 LAB — HEPARIN LEVEL (UNFRACTIONATED): HEPARIN UNFRACTIONATED: 0.45 [IU]/mL (ref 0.30–0.70)

## 2018-06-28 NOTE — Progress Notes (Signed)
Pt. Is questioning about the removal of left BKA stitches. Pt states she was suppose to be seen on outpatient basis at 3 weeks for removal but has since been admitted to the hospital due to her right foot. Pt would like to speak with Dr. Sharol Given about possible removal.

## 2018-06-28 NOTE — Progress Notes (Addendum)
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 Currently hospital day4.  I have evaluated and examined [name] this morning. She had a blanket covering her entire body due to her overnight chills, but was able to lower it during our interaction. She was started on Dilaudid 1mg  q2h PRN overnight with some improvement in her L flank (now rated as an 8/10) & RLE pain. She continues to endorse diffuse abdominal pain and menstrual bleeding. Her R 4th toe remains discolored but has not spread compared to yesterday. We informed her of the plans to continue to monitor her toe and if it progresses, then for vascular surgery to proceed w/ an amputation. She is agreeable w/ the plan.  Objective:  Vital signs in last 24 hours: Vitals:   06/27/18 2124 06/28/18 0411 06/28/18 1057 06/28/18 1100  BP: (!) 158/94 (!) 141/101 (!) 166/83 (!) 166/83  Pulse: 83 75 91   Resp: 14 15 20  (!) 21  Temp: 98.7 F (37.1 C) 98 F (36.7 C)    TempSrc: Oral Oral    SpO2: 100% 99%    Weight:      Height:       Physical Exam  Constitutional: She is oriented to person, place, and time.  Caucasian female in moderate distress who appears somewhat somnolent  HENT:  Head: Normocephalic and atraumatic.  Right Ear: External ear normal.  Left Ear: External ear normal.  Eyes: Conjunctivae and EOM are normal.  No conjunctival pallor  Neck: Normal range of motion. Neck supple.  Cardiovascular: Normal rate, regular rhythm and normal heart sounds. Exam reveals no gallop and no friction rub.  No murmur heard. Pulses:      Radial pulses are 2+ on the right side, and 2+ on the left side.       Dorsalis pedis pulses are 2+ on the right side.  Pulmonary/Chest: Effort normal and breath  sounds normal. No respiratory distress. She has no wheezes. She has no rales.  Abdominal: Soft. Bowel sounds are normal. She exhibits distension (secondary to body habitus). There is no tenderness. There is no rebound and no guarding.  Musculoskeletal:  L BKA dressing intact with no drainage appreciated. RLE w/ pronounced erythema to the dorsal foot with mild streaking proximally. 4th toe on the RLE exhibits purple discoloration extending from the tip proximally to the middle phalanx. RLE is cool to touch, but the foot is warm to touch  Neurological: She is alert and oriented to person, place, and time.  Sensation remains unchanged in her RLE w/ decreased sensation to the RLE and absent sensation to the foot   Assessment/Plan:  Principal Problem:   Lower limb ischemia Active Problems:   Type II diabetes mellitus with renal manifestations, uncontrolled (HCC)   Tobacco abuse   Essential hypertension   AKI (acute kidney injury) ([place])   Cellulitis   Lymphangitis   Menorrhagia  This is a 37 y.o Caucasian Female w/ a PMHx of uncontrolled T2DM, 25 pack year 1ppd smoker, HTN & dyslipidemia presenting w/ RLE ischemia s/p day 2 R popliteal atherectomy & balloon angioplasty. Currently hospital day4  RLE ischemia w/ Lymphangitis & Cellulitis. She is s/p day 2 atherectomy & balloon angioplasty. Her R DP pulse is now palpable and the  purple discoloration on her R 4th toe has not worsened. Her lymphangitisthat is most pronounced in the dorsal R foot continues toimprove w/ Vanc &Cefepime.Vasc Sg initiated dilaudid overnight which she notes her pain is being managed somewhat w/ it. -Appreciate Vasc Sg recommendations - Continue to monitor R 4th toe w/ possible amputation - Start Plavix - Continue Vanc and Cefepime - Await MRSA Screening for abx adjustment - Continue Heparin 1400 unit/hr - Continue Percocet along w/ Dilaudid  Lbackpain, 2/2 MKS pain vs retroperitoneal bleed.Radiologist on  call confirmed no bleed visualized on CT abd done during initial admission so likely the etiology is MSK pain. Dilaudid started overnight by vasc sg along w/ the lidocaine patch has been somewhat managing her pain, although she still rates it at an 8/10. We will continue the Percocet q4h PRN w/ close observation of sedation as the pt was slightly sedated this am. - Continue Lidocaine patch - Continue Dilaudid - Continue Percocet q4h  Acute Menorrhagia of unknown etiology. Pt reports she is still experiencing vaginal bleeding, but my evaluation did not reveal any significant pallor. Her HgB actually improved from 7.4 yesterday to 7.8 today and her Heparin level continues to be stable at 0.45. Still have plans for her to f/u w/ her outpt GYN for further evaluation of her bleeding. - Continue to monitor CBC - Continue to monitor Heparin level  ?AKI CKDIII: Cr has improved from 2.16 yesterday to 1.81 today while on IV fluids. - Will stop IVF as patient is tolerating PO, will still receive 314ml/d with IV Abx - AM BMP  Uncontrolled T2DM, insulin dependent, w/ neuropathy. BS has improved from the 200s yesterday to 123 today while on Lantus 30 units. - Continue 30 units Lantus - Continue resistant SSI - Check daily blood sugars - Continue Neurontin 800mg  cap BID  Essential HTN. Pt's blood pressure has been stable in the 389-373'S systolic. Due to her Hx of kidney disease, consider starting an ACE-Iin the future. - Continue Clonidine  Hx of Dyslipidemia. - Start 80mg  Atorvastatin  Dispo: Anticipated discharge in approximately 4-5 days.   [name], Medical Student 06/28/2018, 1:18 PM Pager: [phone]  Attestation for Student Documentation:  I personally was present and performed or re-performed the history, physical exam and medical decision-making activities of this service and have verified that the service and findings are accurately documented in the student's note with  some additions/corrections.  [name], MD 06/28/2018, 2:40 PM

## 2018-06-28 NOTE — Progress Notes (Addendum)
Progress Note    06/28/2018 8:11 AM 1 Day Post-Op  Subjective:  Says she is staying under the blanket because she has pain.  Says her legs are swelling.  Afebrile  Vitals:   06/27/18 2124 06/28/18 0411  BP: (!) 158/94 (!) 141/101  Pulse: 83 75  Resp: 14 15  Temp: 98.7 F (37.1 C) 98 F (36.7 C)  SpO2: 100% 99%    Physical Exam: Cardiac:  regular Lungs:  Non labored Incisions:  Unable to evaluate Extremities:  Palpable right AT pulse;    CBC    Component Value Date/Time   WBC 7.8 06/28/2018 0522   RBC 2.57 (L) 06/28/2018 0522   HGB 7.8 (L) 06/28/2018 0522   HCT 24.6 (L) 06/28/2018 0522   PLT 172 06/28/2018 0522   MCV 95.7 06/28/2018 0522   MCH 30.4 06/28/2018 0522   MCHC 31.7 06/28/2018 0522   RDW 12.3 06/28/2018 0522   LYMPHSABS 3.3 06/25/2018 0129   MONOABS 0.9 06/25/2018 0129   EOSABS 0.6 06/25/2018 0129   BASOSABS 0.1 06/25/2018 0129    BMET    Component Value Date/Time   NA 139 06/28/2018 0522   K 4.3 06/28/2018 0522   CL 110 06/28/2018 0522   CO2 21 (L) 06/28/2018 0522   GLUCOSE 123 (H) 06/28/2018 0522   BUN 13 06/28/2018 0522   CREATININE 1.81 (H) 06/28/2018 0522   CALCIUM 8.1 (L) 06/28/2018 0522   GFRNONAA 34 (L) 06/28/2018 0522   GFRAA 40 (L) 06/28/2018 0522    INR    Component Value Date/Time   INR 1.16 06/25/2018 0557     Intake/Output Summary (Last 24 hours) at 06/28/2018 0811 Last data filed at 06/28/2018 0626 Gross per 24 hour  Intake 1818.74 ml  Output 450 ml  Net 1368.74 ml     Assessment:  37 y.o. female is s/p:  Procedure Performed:             1.  Ultrasound-guided access, left femoral artery             2.  Abdominal aortogram with CO2             3.  Right lower extremity runoff             4.  Atherectomy with drug-coated balloon angioplasty, right popliteal artery             5.  Conscious sedation (67 minutes)             6.  Closure device (Mynx)  1 Day Post-Op   Plan: -pt has palpable right AT pulse.   Necrotic toe on right foot has slim chance, but Dr. Trula Slade wants to give it time to demarcate and declare itself before proceeding with toe amputation.  He explained to pt that it will take time to see if the toe will heal.  He also discussed with her that it is crucial she get her diabetes under control.  She does not have a PCP and this should be arranged prior to discharge.  -creatinine 1.8 this morning.  A total of 20cc of contrast was used during procedure yesterday. -continue heparin for now while in the hospital; will start Plavix today for drug coated balloon intervention -hgb stable and up slightly from yesterday  -darco shoe   Leontine Locket, PA-C [facility] [phone] 06/28/2018 8:11 AM  Agree with the above.  Palpable right PT pulse.  Right toes remain discolored, especially #4.  Discussed  she will likely require toe amputation.  Would give her a few more days to let her toes demarcate.   Continue IV heparin.  Will start Plavix, given DCB intervention yesterday.   Annamarie Major

## 2018-06-28 NOTE — Progress Notes (Signed)
ANTICOAGULATION CONSULT NOTE - Follow Up Consult  Pharmacy Consult for Heparin and Vanco and Cefepime Indication: RLE ischemia and non-healing wound.   Allergies  Allergen Reactions  . Hydrocodone-Acetaminophen Hives  . Tylenol [Acetaminophen] Itching and Swelling    Patient Measurements: Height: 5\' 9"  (175.3 cm) Weight: 160 lb 0.9 oz (72.6 kg) IBW/kg (Calculated) : 66.2 Heparin Dosing Weight:   Vital Signs: Temp: 98 F (36.7 C) (09/18 0411) Temp Source: Oral (09/18 0411) BP: 141/101 (09/18 0411) Pulse Rate: 75 (09/18 0411)  Labs: Recent Labs    06/26/18 0357 06/27/18 0502 06/28/18 0522  HGB 8.0* 7.4* 7.8*  HCT 24.8* 23.2* 24.6*  PLT 184 176 172  HEPARINUNFRC 0.33 0.34 0.45  CREATININE 2.07* 2.16* 1.81*    Estimated Creatinine Clearance: 43.6 mL/min (A) (by C-G formula based on SCr of 1.81 mg/dL (H)).   Assessment: Anticoag: heparin gtt for RLE ischemia. Nursing confirms no s/s of bleeding aside from vaginal bleeding present on admit. HL 0.45. Hgb remain slow 7.8. Plts 172 stable.   ID: non healing left wound at amputation and right foot lymphangitis. WBC 7.8. Afebrile. Scr 1.81 down  9/15 Vanc>> 9/15 Zosyn>>9/17 9/17 Cefepime>>  9/15 BCx>>   Goal of Therapy:  Vanco trough 10-15 Heparin level 0.3-0.7 units/ml Monitor platelets by anticoagulation protocol: Yes   Plan:  Continue Vancomycin 1 g IV q24h. Level in AM. Cefepime 2gm IV daily dose for CrCl<50 Continue Heparin 1400 units/hr. F/u long-term anticoag plan. Patient experiencing vaginal bleeding - monitor H&H closely    Maxden Naji S. Alford Highland, PharmD, BCPS Clinical Staff Pharmacist [phone] Eilene Ghazi Stillinger 06/28/2018,7:53 AM

## 2018-06-28 NOTE — Care Management Note (Signed)
Case Management Note [name] RN, BSN Unit 4E- RN Care Coordinator  [phone]  Patient Details  Name: Kathy Phelps MRN: [id number] Date of Birth: 02/10/1981  Subjective/Objective:  Pt admitted with lower limb ischemia, s/p aortogram with atherectomy and drug-coated balloon angioplasty per vascular on 9/17. (may need toe amputation)                Action/Plan: PTA pt lived at home, per CM on last admission pt was set up with PCP for first available appointment on Tuesday, July 12, 2018 at 3:20pm at the [facility] , [address] [place] [phone].  CM will follow for transition of care needs.   Expected Discharge Date:                  Expected Discharge Plan:  Home/Self Care  In-House Referral:     Discharge planning Services  CM Consult  Post Acute Care Choice:    Choice offered to:     DME Arranged:    DME Agency:     HH Arranged:    HH Agency:     Status of Service:  In process, will continue to follow  If discussed at Long Length of Stay Meetings, dates discussed:    Discharge Disposition:   Additional Comments:  Dawayne Patricia, RN 06/28/2018, 2:03 PM

## 2018-06-28 NOTE — Progress Notes (Signed)
Upon assessment of right foot, patients right pinky toe is beginning to turn black. Pedal pulse is present +1 Patient concerned. Will continue to monitor.

## 2018-06-28 NOTE — Progress Notes (Signed)
Suspect that patient is smoking in the bathroom, strong smell of smoke out in the hall and in the patient's room. Patient was found in the shower, did not notify nurse or nurse tech she wanted to bathe. Patient denies smoking, no cigarettes or smoking paraphernalia seen in the room. Explained no smoking policy and strongly encouraged no smoking in the room.

## 2018-06-29 LAB — BASIC METABOLIC PANEL
ANION GAP: 9 (ref 5–15)
BUN: 12 mg/dL (ref 6–20)
CO2: 21 mmol/L — AB (ref 22–32)
Calcium: 8.1 mg/dL — ABNORMAL LOW (ref 8.9–10.3)
Chloride: 107 mmol/L (ref 98–111)
Creatinine, Ser: 1.81 mg/dL — ABNORMAL HIGH (ref 0.44–1.00)
GFR calc Af Amer: 40 mL/min — ABNORMAL LOW (ref >60)
GFR calc non Af Amer: 34 mL/min — ABNORMAL LOW (ref >60)
Glucose, Bld: 167 mg/dL — ABNORMAL HIGH (ref 70–99)
POTASSIUM: 3.8 mmol/L (ref 3.5–5.1)
Sodium: 137 mmol/L (ref 135–145)

## 2018-06-29 LAB — VANCOMYCIN, TROUGH: Vancomycin Tr: 19 ug/mL (ref 15–20)

## 2018-06-29 LAB — GLUCOSE, CAPILLARY
GLUCOSE-CAPILLARY: 172 mg/dL — AB (ref 70–99)
Glucose-Capillary: 108 mg/dL — ABNORMAL HIGH (ref 70–99)
Glucose-Capillary: 161 mg/dL — ABNORMAL HIGH (ref 70–99)
Glucose-Capillary: 98 mg/dL (ref 70–99)

## 2018-06-29 LAB — CBC
HEMATOCRIT: 21.7 % — AB (ref 36.0–46.0)
HEMOGLOBIN: 7.1 g/dL — AB (ref 12.0–15.0)
MCH: 30 pg (ref 26.0–34.0)
MCHC: 32.7 g/dL (ref 30.0–36.0)
MCV: 91.6 fL (ref 78.0–100.0)
Platelets: 169 10*3/uL (ref 150–400)
RBC: 2.37 MIL/uL — ABNORMAL LOW (ref 3.87–5.11)
RDW: 12.1 % (ref 11.5–15.5)
WBC: 7 10*3/uL (ref 4.0–10.5)

## 2018-06-29 LAB — TYPE AND SCREEN
ABO/RH(D): A POS
Antibody Screen: NEGATIVE

## 2018-06-29 LAB — HEPARIN LEVEL (UNFRACTIONATED): HEPARIN UNFRACTIONATED: 0.29 [IU]/mL — AB (ref 0.30–0.70)

## 2018-06-29 MED ORDER — OXYCODONE HCL 5 MG PO TABS
5.0000 mg | ORAL_TABLET | ORAL | Status: DC | PRN
  Administered 2018-06-29 – 2018-07-02 (×11): 5 mg via ORAL
  Filled 2018-06-29 (×12): qty 1

## 2018-06-29 MED ORDER — ALUM & MAG HYDROXIDE-SIMETH 200-200-20 MG/5ML PO SUSP
30.0000 mL | ORAL | Status: DC | PRN
  Administered 2018-06-29 – 2018-06-30 (×2): 30 mL via ORAL
  Filled 2018-06-29 (×2): qty 30

## 2018-06-29 MED ORDER — DICLOFENAC SODIUM 1 % TD GEL
2.0000 g | Freq: Four times a day (QID) | TRANSDERMAL | Status: DC
  Administered 2018-06-29 – 2018-07-01 (×5): 2 g via TOPICAL
  Filled 2018-06-29 (×2): qty 100

## 2018-06-29 MED ORDER — CLOPIDOGREL BISULFATE 75 MG PO TABS
75.0000 mg | ORAL_TABLET | Freq: Every day | ORAL | Status: DC
  Administered 2018-06-29 – 2018-07-03 (×5): 75 mg via ORAL
  Filled 2018-06-29 (×5): qty 1

## 2018-06-29 MED ORDER — ACETAMINOPHEN 325 MG PO TABS
650.0000 mg | ORAL_TABLET | Freq: Four times a day (QID) | ORAL | Status: DC | PRN
  Administered 2018-06-29 – 2018-06-30 (×2): 650 mg via ORAL
  Filled 2018-06-29 (×2): qty 2

## 2018-06-29 MED ORDER — CEFAZOLIN SODIUM-DEXTROSE 1-4 GM/50ML-% IV SOLN
1.0000 g | Freq: Three times a day (TID) | INTRAVENOUS | Status: DC
  Administered 2018-06-29 – 2018-07-03 (×12): 1 g via INTRAVENOUS
  Filled 2018-06-29 (×13): qty 50

## 2018-06-29 NOTE — Progress Notes (Addendum)
  Progress Note    06/29/2018 11:41 AM 2 Days Post-Op  Subjective:  Still having pain R foot   Vitals:   06/29/18 0637 06/29/18 1058  BP:  123/83  Pulse:    Resp:    Temp: 98 F (36.7 C)   SpO2:     Physical Exam: Lungs:  Non labored Incisions:  L groin cath site unremarkable Extremities:  No palpable R pedal pulse however PT by doppler; dusky toe tips; 4th toe demarcating; L BKA healing well Abdomen:  Soft Neurologic: A&O  CBC    Component Value Date/Time   WBC 7.0 06/29/2018 0318   RBC 2.37 (L) 06/29/2018 0318   HGB 7.1 (L) 06/29/2018 0318   HCT 21.7 (L) 06/29/2018 0318   PLT 169 06/29/2018 0318   MCV 91.6 06/29/2018 0318   MCH 30.0 06/29/2018 0318   MCHC 32.7 06/29/2018 0318   RDW 12.1 06/29/2018 0318   LYMPHSABS 3.3 06/25/2018 0129   MONOABS 0.9 06/25/2018 0129   EOSABS 0.6 06/25/2018 0129   BASOSABS 0.1 06/25/2018 0129    BMET    Component Value Date/Time   NA 137 06/29/2018 0318   K 3.8 06/29/2018 0318   CL 107 06/29/2018 0318   CO2 21 (L) 06/29/2018 0318   GLUCOSE 167 (H) 06/29/2018 0318   BUN 12 06/29/2018 0318   CREATININE 1.81 (H) 06/29/2018 0318   CALCIUM 8.1 (L) 06/29/2018 0318   GFRNONAA 34 (L) 06/29/2018 0318   GFRAA 40 (L) 06/29/2018 0318    INR    Component Value Date/Time   INR 1.16 06/25/2018 0557     Intake/Output Summary (Last 24 hours) at 06/29/2018 1141 Last data filed at 06/29/2018 1100 Gross per 24 hour  Intake 279.76 ml  Output -  Net 279.76 ml     Assessment/Plan:  37 y.o. female is s/p RLE arteriogram with popliteal atherectomy 2 Days Post-Op   Re-perfusion pain likely R foot Perfusing RLE well based on exam Allowing for toes to demarcate R foot Continue plavix Dispo based on timing of toe amp  DVT prophylaxis:  IV heparin   Dagoberto Ligas, PA-C [facility] [phone] 06/29/2018 11:41 AM  I have independently interviewed and examined patient and agree with PA assessment and plan  above. Will likely need amputation right fourth toe but her forefoot is looking worse each day so needs continued demarcation prior to any surgery. We discussed that she is at risk for bka on the right given palpable pulses with worsening discoloration of her foot.   Brandon C. Donzetta Matters, MD [facility] [place] Office: [phone] Pager: [phone]

## 2018-06-29 NOTE — Progress Notes (Signed)
Pharmacy Antibiotic Note  Kathy Phelps is a 37 y.o. female admitted on 06/25/2018 with 2 week hx of discoloration to R toes. Pt also noted to be s/p L BKA with non healing wound at amputation site.  Pharmacy has been consulted for Vancomycin and Cefepime dosing.  Vancomycin trough this AM = 19 Blood cx ng x 4 days No would cx available  Plan: Continue Vancomycin 1gm IV q24h Continue Cefepime 2gm IV q24h Follow-up antibiotic plan.  Height: 5\' 9"  (175.3 cm) Weight: 160 lb 0.9 oz (72.6 kg) IBW/kg (Calculated) : 66.2  Temp (24hrs), Avg:99 F (37.2 C), Min:98 F (36.7 C), Max:100.1 F (37.8 C)  Recent Labs  Lab 06/25/18 0129 06/25/18 0149 06/25/18 0433 06/26/18 0357 06/27/18 0502 06/28/18 0522 06/29/18 0318  WBC 15.6*  --   --  8.6 7.5 7.8 7.0  CREATININE 2.25*  --   --  2.07* 2.16* 1.81* 1.81*  LATICACIDVEN  --  1.19 1.07  --   --   --   --   VANCOTROUGH  --   --   --   --   --   --  19    Estimated Creatinine Clearance: 43.6 mL/min (A) (by C-G formula based on SCr of 1.81 mg/dL (H)).    Allergies  Allergen Reactions  . Hydrocodone-Acetaminophen Hives  . Tylenol [Acetaminophen] Itching and Swelling    Antimicrobials this admission: 9/15 Vanc>> 9/15 Zosyn>>9/17 9/17 Cefepime>>  Dose adjustments this admission: 9/19 VT 19, no change  Microbiology results: 9/15 BCx>> ngtd 9/17 MRSA PCR negative  Thank you for allowing pharmacy to be a part of this patient's care.  [employer], Pharm.D., BCPS Clinical Pharmacist Pager: [phone] Clinical phone for 06/29/2018 from 8:30-4:00 is [phone].  **Pharmacist phone directory can now be found on [email or web address] ([email or web address]).  Listed under [place].  06/29/2018 8:46 AM

## 2018-06-29 NOTE — Progress Notes (Signed)
  Date: 06/29/2018  Patient name: Kathy Phelps  Medical record number: [id number]  Date of birth: June 30, 1981   I have seen and evaluated this patient and I have discussed the plan of care with the house staff. Please see their note for complete details. I concur with their findings with the following additions/corrections: [name] was seen on morning team rounds.  She states her pain is uncontrolled and requests a higher dose of the Dilaudid.  However, she has had minimal use of both her Dilaudid and and we explained that before increasing the dose, she needs to use the current dosing more frequently as it may get her pain under control and the lower dose will have less risk of negative side effects.  She agrees to this plan.  We will also schedule acetaminophen and change to Percocet to plain oxycodone so that she does not get more than 3 g of acetaminophen per day.  We are going with a 3 g total daily dose rather than 4 g as she is a smoker which can increase the risk of acetaminophen toxicity.  Vascular surgery is watching the right foot to help clean amputation.  The right fourth toe remains black mostly on the plantar surface, the fifth toe is dusky on the plantar surface, and the third and second toe have just a slight area of duskiness.  We are narrowing her antibiotics as she is afebrile (t max 100.1), without leukocytosis, and has had no worsening of her presumed foot infection.  Bartholomew Crews, MD 06/29/2018, 1:38 PM

## 2018-06-29 NOTE — Progress Notes (Addendum)
Subjective:  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 Currently hospital day5.  I have evaluated and examined Kathy Phelps this morning. She continues to experience 8-9/10 pain in her L flank and RLE with concerns of possible spread of the purple discoloration from the tip of her 5th toe proximally. She also endorses mild nausea which remains unchanged from yesterday and vaginal bleeding which required 9 pads, but denies any further chills, abdominal pain or further complaints. Since she has not received the dilaudid and percocet as frequent as she is allowed, we encouraged her to use them more frequent before we increased their dose. We also informed her of our plans to monitor her R toes to see if their is progression of the discoloration. She is agreeable with our plans.  Objective:  Vital signs in last 24 hours: Vitals:   06/28/18 2145 06/29/18 0355 06/29/18 0637 06/29/18 1058  BP: 133/82 124/82  123/83  Pulse:  73    Resp:  (!) 22    Temp:  98.3 F (36.8 C) 98 F (36.7 C)   TempSrc:  Oral Oral   SpO2:  96%    Weight:      Height:       Physical Exam  Constitutional: She is oriented to person, place, and time.  Caucasian female who is in moderate distress but is cooperative during the questions  HENT:  Head: Normocephalic and atraumatic.  Right Ear: External ear normal.  Left Ear: External ear normal.  Eyes: Conjunctivae and EOM are normal.  Neck: Normal range of motion. Neck supple.  Cardiovascular: Normal rate, regular rhythm and normal heart sounds. Exam reveals no gallop and no friction rub.  No murmur heard. Pulses:      Radial pulses are 2+ on the right side, and 2+ on the left side.       Dorsalis pedis  pulses are 1+ on the right side.  Pulmonary/Chest: Breath sounds normal. No respiratory distress. She has no wheezes. She has no rales.  Abdominal: Soft. Bowel sounds are normal. She exhibits distension (secondary to body habitus). She exhibits no mass. There is no tenderness. There is no rebound and no guarding.  Musculoskeletal:  L BKA with stitches in place and no signs of erythema, edema, induration, fluctuance or drainage appreciated. RLE with erythema to the dorsum of her foot with no further lymphangitis spreading proximally up the lower leg. There is purple discoloration to the 4th toe and to the plantar aspect of the 5th toe that remains unchanged from yesterday. There is still severe tenderness to palpation superior the R malleolus that is unchanged from yesterday  Neurological: She is alert and oriented to person, place, and time.  Sensation remains unchanged in her RLE with decreased sensation in the lower leg and absent sensation in her foot    Assessment/Plan:  Principal Problem:   Lower limb ischemia Active Problems:   Type II diabetes mellitus with renal manifestations, uncontrolled (HCC)   Tobacco abuse   Essential hypertension   AKI (acute kidney injury) ([place])   Cellulitis   Lymphangitis   Menorrhagia  This is a 37 y.o Caucasian Female w/ a PMHx of uncontrolled T2DM, 25 pack year 1ppd smoker, HTN & dyslipidemia presenting  w/ RLE ischemia s/p day 2 R popliteal atherectomy & balloon angioplasty. Her R DP pulse today exhibits weaker pulse compared to yesterday. Currently hospital day4  RLE ischemia s/p R popliteal atherectomy & baloon angioplasty day 2. Her R DP pulse continues to be palpable, but seems a bit weaker (1+) compared to yesterday (2+). The purple discoloration on her R 4th toe and the plantar aspect of her 5th toe remains unchanged from yesterday. There is still erythema to the dorsal aspect of her R foot, but I did not appreciate any lymphangitis extending  proximally up the lower leg. Due to a negative MRSA screen, we will swap Vanc for Cefazolin. Pt reports her RLE pain has not been managed w/ the current pain regiment of dilaudid + percocet, but upon medication review, she has only taken the Dilaudid twice (8pm yesterday and 5am today) and Percocet twice (9pm yesterday & 7am today). We encouraged her to increase the frequency of the pain meds to help manage her pain. We will change the Percocet to plain Oxycodone and add acetaminophen. -AppreciateVasc Sg recommendations - Continue to monitor R toes w/ possible amputation - Continue Plavix - Stop Vanc / Cefepime - Start Cefazolin 1g q8 - Increase Heparin to 1550 unit/hr per pharmacy - Continue Dilaudid - Start Oxycodone IR 5mg  q4h PRN - Start Acetaminophen 650mg  q6h PRN w/ max dose of 3g  Lbackpain, 2/2 MKS pain.She reports the lidocaine patch has not really helped her pain so we will swap it for the voltaren gel since her Cr has stabilized. She still rates her pain as an 8-9/10, but she has not been taking her Dilaudid 2mg  q2h PRN and Percocet 5mg  q4h PRN as frequent as she is allowed. We encouraged her to take the Percocet more frequent. - Start Voltaren gel - Continue Dilaudid - Start Oxycodone  - Start Acetaminophen  Acute Menorrhagia of unknown etiology. Pt reports soaking through 9 pads overnight but has no other forms of bleeding. Her HgB has dropped from 7.4 yesterday to 7.1 today so we will do a type and screen and have blood ready in case she declines below 7. Her heparin level was subtherapeutic at 0.29 so pharmacy was consulted for the appropriate dosing. We still have plans for her to f/u w/ her outpt GYN for further evaluation of her bleeding. - Order type and screen - Transfuse 1 unit if HgB drops below 7 - Continue to monitor CBC - Continue to monitor Heparin level  ?AKI CKDIII: Cr remains stable at 1.81 while off fluids since she is able to tolerate PO - Monitor AM  BMP  Uncontrolled T2DM, insulin dependent, w/ neuropathy. BS remains in the mid 100s while on Lantus 30 units. - Continue 30units Lantus - Continue resistant SSI - Check daily blood sugars - Continue Neurontin 800mg  cap BID  Essential HTN. Pt's blood pressure has been stable in the 629'U systolic. Due to her Hx of kidney disease, consider starting an ACE-Iin the future. - Continue Clonidine  Hx of Dyslipidemia. - Continue 80mg  Atorvastatin  Dispo: Anticipated discharge in approximately 4-5 days.   [name], Medical Student 06/29/2018, 11:34 AM Pager: [phone]  Attestation for Student Documentation:  I personally was present and performed or re-performed the history, physical exam and medical decision-making activities of this service and have verified that the service and findings are accurately documented in the student's note with some additions/corrections.  [name], MD 06/30/2018, 6:32 AM

## 2018-06-29 NOTE — Progress Notes (Signed)
ANTICOAGULATION CONSULT NOTE - Follow Up Consult  Pharmacy Consult for Heparin Indication: peripheral vasc disease, RLE ischemia  Allergies  Allergen Reactions  . Hydrocodone-Acetaminophen Hives  . Tylenol [Acetaminophen] Itching and Swelling    Patient Measurements: Height: 5\' 9"  (175.3 cm) Weight: 160 lb 0.9 oz (72.6 kg) IBW/kg (Calculated) : 66.2 Heparin Dosing Weight: 72 kg  Vital Signs: Temp: 98 F (36.7 C) (09/19 0637) Temp Source: Oral (09/19 0637) BP: 124/82 (09/19 0355) Pulse Rate: 73 (09/19 0355)  Labs: Recent Labs    06/27/18 0502 06/28/18 0522 06/29/18 0318  HGB 7.4* 7.8* 7.1*  HCT 23.2* 24.6* 21.7*  PLT 176 172 169  HEPARINUNFRC 0.34 0.45 0.29*  CREATININE 2.16* 1.81* 1.81*    Estimated Creatinine Clearance: 43.6 mL/min (A) (by C-G formula based on SCr of 1.81 mg/dL (H)).   Medications:  Scheduled:  . atorvastatin  80 mg Oral q1800  . cloNIDine  0.1 mg Oral BID  . gabapentin  800 mg Oral BID  .  HYDROmorphone (DILAUDID) injection  0.15 mg Intravenous Once  . insulin aspart  0-20 Units Subcutaneous TID WC  . insulin aspart  0-5 Units Subcutaneous QHS  . insulin glargine  30 Units Subcutaneous QHS  . lidocaine  1 patch Transdermal Q24H  . mirtazapine  15 mg Oral QHS  . mometasone-formoterol  2 puff Inhalation BID  . nicotine  21 mg Transdermal Daily  . pantoprazole  40 mg Oral Daily  . sodium chloride flush  3 mL Intravenous Q12H   Infusions:  . ceFEPime (MAXIPIME) IV 2 g (06/28/18 1004)  . heparin 1,400 Units/hr (06/29/18 0841)  . vancomycin 1,000 mg (06/29/18 0511)    Assessment: 37 yo F on heparin for LE vasc disease s/p balloon angioplasty.  Heparin level is slightly subtherapeutic on 1400 units/hr.  Noted Hgb trending down to 7.1 with continued vaginal bleeding.  Goal of Therapy:  Heparin level 0.3-0.7 units/ml Monitor platelets by anticoagulation protocol: Yes   Plan:  Increase heparin to 1550 units/hr. Daily heparin level and  CBC while on heparin.  [employer], Pharm.D., BCPS Clinical Pharmacist Pager: [phone] Clinical phone for 06/29/2018 from 8:30-4:00 is [phone].  **Pharmacist phone directory can now be found on [email or web address] ([email or web address]).  Listed under [place].  06/29/2018 8:49 AM

## 2018-06-30 DIAGNOSIS — R3915 Urgency of urination: Secondary | ICD-10-CM

## 2018-06-30 DIAGNOSIS — R112 Nausea with vomiting, unspecified: Secondary | ICD-10-CM

## 2018-06-30 DIAGNOSIS — R1013 Epigastric pain: Secondary | ICD-10-CM

## 2018-06-30 DIAGNOSIS — R630 Anorexia: Secondary | ICD-10-CM

## 2018-06-30 DIAGNOSIS — N179 Acute kidney failure, unspecified: Secondary | ICD-10-CM

## 2018-06-30 LAB — BASIC METABOLIC PANEL
Anion gap: 11 (ref 5–15)
BUN: 11 mg/dL (ref 6–20)
CHLORIDE: 99 mmol/L (ref 98–111)
CO2: 28 mmol/L (ref 22–32)
Calcium: 8.6 mg/dL — ABNORMAL LOW (ref 8.9–10.3)
Creatinine, Ser: 1.68 mg/dL — ABNORMAL HIGH (ref 0.44–1.00)
GFR calc Af Amer: 43 mL/min — ABNORMAL LOW (ref >60)
GFR calc non Af Amer: 37 mL/min — ABNORMAL LOW (ref >60)
GLUCOSE: 179 mg/dL — AB (ref 70–99)
Potassium: 3.7 mmol/L (ref 3.5–5.1)
Sodium: 138 mmol/L (ref 135–145)

## 2018-06-30 LAB — CBC
HEMATOCRIT: 24.1 % — AB (ref 36.0–46.0)
HEMOGLOBIN: 7.9 g/dL — AB (ref 12.0–15.0)
MCH: 29.7 pg (ref 26.0–34.0)
MCHC: 32.8 g/dL (ref 30.0–36.0)
MCV: 90.6 fL (ref 78.0–100.0)
Platelets: 206 10*3/uL (ref 150–400)
RBC: 2.66 MIL/uL — ABNORMAL LOW (ref 3.87–5.11)
RDW: 12.3 % (ref 11.5–15.5)
WBC: 8.3 10*3/uL (ref 4.0–10.5)

## 2018-06-30 LAB — GLUCOSE, CAPILLARY
GLUCOSE-CAPILLARY: 204 mg/dL — AB (ref 70–99)
Glucose-Capillary: 120 mg/dL — ABNORMAL HIGH (ref 70–99)
Glucose-Capillary: 203 mg/dL — ABNORMAL HIGH (ref 70–99)
Glucose-Capillary: 134 mg/dL — ABNORMAL HIGH (ref 70–99)
Glucose-Capillary: 157 mg/dL — ABNORMAL HIGH (ref 70–99)

## 2018-06-30 LAB — CULTURE, BLOOD (ROUTINE X 2)
CULTURE: NO GROWTH
Culture: NO GROWTH
SPECIAL REQUESTS: ADEQUATE

## 2018-06-30 LAB — LIPASE, BLOOD: LIPASE: 17 U/L (ref 11–51)

## 2018-06-30 LAB — HEPARIN LEVEL (UNFRACTIONATED): Heparin Unfractionated: 0.34 IU/mL (ref 0.30–0.70)

## 2018-06-30 MED ORDER — FAMOTIDINE 20 MG PO TABS
20.0000 mg | ORAL_TABLET | Freq: Every day | ORAL | Status: DC
  Administered 2018-06-30 – 2018-07-01 (×2): 20 mg via ORAL
  Filled 2018-06-30 (×3): qty 1

## 2018-06-30 MED ORDER — ACETAMINOPHEN 325 MG PO TABS
650.0000 mg | ORAL_TABLET | Freq: Four times a day (QID) | ORAL | Status: DC
  Administered 2018-06-30: 650 mg via ORAL
  Filled 2018-06-30: qty 2

## 2018-06-30 MED ORDER — ACETAMINOPHEN 325 MG PO TABS
650.0000 mg | ORAL_TABLET | Freq: Four times a day (QID) | ORAL | Status: DC
  Administered 2018-06-30 – 2018-07-03 (×8): 650 mg via ORAL
  Filled 2018-06-30 (×9): qty 2

## 2018-06-30 MED ORDER — HYDROMORPHONE HCL 1 MG/ML IJ SOLN
0.5000 mg | INTRAMUSCULAR | Status: DC | PRN
  Administered 2018-06-30 – 2018-07-01 (×7): 0.5 mg via INTRAVENOUS
  Filled 2018-06-30 (×7): qty 1

## 2018-06-30 NOTE — Progress Notes (Addendum)
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 Currently hospital day6.  I have evaluated and examined Kathy Phelps this morning. Her main complaints are epigastric pain radiating to her L flank and RLE (pain is being managed and not increased in intensity compared to yesterday) along w/ new onset purple discoloration and pain to all of her toes whenever she dangles her foot to the side of the bed. Aside from these major complaints, she also reports dysphagia which is currently resolved (no focal neuro deficits such as new onset weakness, numbness or slurred speech), nausea and vomiting x2, urinary urgency & difficulty urinating. She was only able to maintain good urinary output after she had a straight catheterization. We informed her of the plans to continue to monitor her toes w/ possible amputation next week  Objective:  Vital signs in last 24 hours: Vitals:   06/29/18 1058 06/29/18 1325 06/29/18 2058 06/30/18 0456  BP: 123/83 107/73 136/82 (!) 151/80  Pulse:  69 100 (!) 56  Resp:  (!) 27 15 18   Temp:  98.1 F (36.7 C) 97.6 F (36.4 C) 98.9 F (37.2 C)  TempSrc:  Oral Oral Oral  SpO2:  98% 100% 100%  Weight:      Height:       Physical Exam  Constitutional: She is oriented to person, place, and time.  Caucasian female in moderate distress but is cooperative with questions  HENT:  Head: Normocephalic and atraumatic.  Right Ear: External ear normal.  Left Ear: External ear normal.  Eyes: Conjunctivae and EOM are normal.  Neck: Normal range of motion. Neck supple.  Cardiovascular: Normal rate, regular rhythm and normal heart sounds. Exam reveals no gallop and no friction rub.  No murmur heard. Pulses:  Radial pulses are 2+ on the right side, and 2+ on the left side.       Dorsalis pedis pulses are 1+ on the right side.  Pulmonary/Chest: Effort normal. No respiratory distress. She has no wheezes. She has no rales.  Abdominal: Soft. Bowel sounds are normal. She exhibits distension (secondary to body habitus). There is tenderness (moderate intensity in epigastrum and left lower quadrant). There is no rebound and no guarding.  Musculoskeletal:  L BKA noted w/ clean stitches and no erythema, edema, or drainage appreciated. RLE w/ erythema to the dorsum of the foot w/ faint lymphangitis extending proximally to the anterior mid shins. Purple discoloration noted to the 4th toe w/ increased darkness at the tip. Purple discoloration also appreciated at the plantar aspect of the 5th toe along w/ duskiness at the plantar aspect of the base of each digit. Moderate tenderness to the RLE superior to the malleolus. Moderate tenderness is still appreciated at the left flank  Neurological: She is alert and oriented to person, place, and time.  Sensation remains unchanged from previous exam of decreased sensation to the R lower leg w/ absent sensation to the foot  Skin:  RLE is cool to touch up until the R foot which is is warm to touch    Assessment/Plan:  Principal Problem:   Lower limb ischemia Active Problems:   Type II diabetes mellitus with renal manifestations, uncontrolled (HCC)   Tobacco abuse   Essential hypertension   AKI (  acute kidney injury) ([place])   Cellulitis   Lymphangitis   Menorrhagia  This is a 37 y.o Caucasian Female w/ a PMHx of uncontrolled T2DM, 25 pack year 1ppd smoker, HTN & dyslipidemia presenting w/ RLE ischemias/p day 3 R popliteal artery atherectomy & balloon angioplasty.Her R DP pulse today continues to exhibits a weak pulse and her discoloration seems unchanged from yesterday. Currently hospital day6  RLE ischemia s/p R popliteal atherectomy & baloon angioplasty day 3. Her  R DP pulse continues to be weak (1+) and the purple discoloration on her R 4th toe and the plantar aspect of her 5th toe remains unchanged from yesterday. There is still erythema to the dorsal aspect of her R foot and a very faint erythematous streak extending proximally up her anterior mid shin. Pt reports her RLE pain has been managed w/ the current pain regiment of dilaudid + oxycodone + acetaminophen. Vascular surgery recommends to wean her off of the IV dilaudid to a PO pain med. -AppreciateVasc Sg recommendations - Continue to monitor R toes w/ possible amputation - Continue Plavix per vascular - Continue Cefazolin 1g q8 - Increase Heparin to 1500 unit/hr per pharmacy - Wean off of IV Dilaudid to PO - Continue Oxycodone IR 5mg  q4h PRN - Swap Acetaminophen 650mg  q6h PRN w/ max dose of 3g to scheduled  Lbackpain, 2/2 MKS pain.The voltaren gel along w/ the pain regiment of dilaudid + oxycodone + acetaminophen has been managing her pain. She complains of a sharp & burning epigastric pain that radiates to her L flank today so we suspect GERD vs pancreatitis. Initial CT abd revealed no pancreatitis but we will investigate this further. Will consider muscle relaxant if current regimen becomes ineffective. - Continue Voltaren gel - Wean off IV Dilaudid for PO - Continue Oxycodone  - Continue Acetaminophen - Check Lipase - Start Famotidine 20mg  qd  Urinary Urgency & Difficulty Urinating 2/2 opioid adverse effect vs UTI. 2 bladder scans done last night showed 171cc & 143cc, but due to her persistent straining, she was straight catheterized w/ 250cc of clear yellow urine. We highly suspect the source of the urination difficulties to be 2/2 long term opioid usage since she also has been experiencing minimal bowel movements over a long period of time. Although she has urinary urgency, she does not exhibit any dysuria or SP tenderness on physical exam. We will hold off on UA as Sx not suspicous for  UTI and instead do daily bladder scans and continue to monitor if she experiences any new onset dysuria, SP tenderness, etc.  - Check daily bladder scans  Acute Menorrhagiaof unknown etiology. HgB is trending upwards from 7.1 yesterday to 7.9 today. Her heparin level remains therapeutic at 0.34. We still have plans for her to f/u w/ her outpt GYN for further evaluation of her bleeding. - Heparin gtt, monitor levels - Monitor CBC - Transfuse < 7  ?AKI CKDIII:Cr continues to improve from 1.81 yesterday to 1.68 today. - Monitor AM BMP  Uncontrolled T2DM, insulin dependent, w/ neuropathy.BS remains in the mid-high 100s while on Lantus 30 units. - Continue30units Lantus - ContinueresistantSSI - Check daily blood sugars - Continue Neurontin 800mg  cap BID  Essential HTN. Pt's blood pressure has been hovering from the 161-096E systolic. Due to her Hx of kidney disease, consider starting an ACE-Iin the future. - Continue Clonidine  Hx of Dyslipidemia. - Continue 80mg  Atorvastatin  Dispo: Anticipated discharge in approximately 4-5 days.   [name], Medical Student 06/30/2018, 11:11  AM Pager: [phone]  Attestation for Student Documentation:  I personally was present and performed or re-performed the history, physical exam and medical decision-making activities of this service and have verified that the service and findings are accurately documented in the student's note with some additions/corrections.  [name], MD 06/30/2018, 1:38 PM

## 2018-06-30 NOTE — Progress Notes (Addendum)
During 0000 rounds, pt reports difficulty swallowing that started earlier in the morning that hasn't gotten better. Denies any unilateral weakness. HA, or visual changes. Neuro intact. Pt does report CVA history (in 2017) and when asked, stated that she had trouble swallowing after her stroke, but was intermittent. Educated pt on taking small sips, chin tuck, etc. Encouraged her to speak w/ rounding team in AM. Will continue to monitor.  Jaymes Graff, RN

## 2018-06-30 NOTE — Progress Notes (Signed)
Pt bladder scanned again around 0420 d/t straining on bed pan. Reports an attempt to urinate on [facility] w/ help of daughter w/ no results prior to RN entering room. 171 mLs shown, but d/t pt discomfort verbal order was given for straight cath. 250 mLs of clear, yellow urine returned. Pt reports only some relief, but still endorses urge to go. Peri care done. Does report times where she doesn't void for "4 days at a time" and having to go home w/ a Foley before. However she reports this is the first time where she has an urge to go but cannot. Pt is very reluctant to discuss this d/t being overwhelmed w/ other heath concerns. Pt given emotional support. Strongly encouraged pt to speak w/ MDs when they round in AM. Will continue to monitor.  Jaymes Graff, RN

## 2018-06-30 NOTE — Progress Notes (Signed)
Pt's daughter took pt into bathroom for wash-up, suspected pt was smoking in room and pt did admit to smoking when asked. Spoke w/ pt about policy and she willingly surrendered cigarettes and a lighter. Items taken to nurses station for [facility].  Jaymes Graff, RN

## 2018-06-30 NOTE — Progress Notes (Addendum)
ANTICOAGULATION CONSULT NOTE - Follow Up Consult  Pharmacy Consult for Heparin Indication: peripheral vasc disease, RLE ischemia  Allergies  Allergen Reactions  . Hydrocodone-Acetaminophen Hives  . Tylenol [Acetaminophen] Itching and Swelling    Patient tolerated APAP 650 mg (06/29/18) as well as oxycodone/APAP 5-325 mg during 06/25/2018 admission.    Patient Measurements: Height: 5\' 9"  (175.3 cm) Weight: 160 lb 0.9 oz (72.6 kg) IBW/kg (Calculated) : 66.2 Heparin Dosing Weight: 72 kg  Vital Signs: Temp: 98.9 F (37.2 C) (09/20 0456) Temp Source: Oral (09/20 0456) BP: 151/80 (09/20 0456) Pulse Rate: 56 (09/20 0456)  Labs: Recent Labs    06/28/18 0522 06/29/18 0318 06/30/18 0439  HGB 7.8* 7.1* 7.9*  HCT 24.6* 21.7* 24.1*  PLT 172 169 206  HEPARINUNFRC 0.45 0.29* 0.34  CREATININE 1.81* 1.81* 1.68*    Estimated Creatinine Clearance: 47 mL/min (A) (by C-G formula based on SCr of 1.68 mg/dL (H)).   Medications:  Scheduled:  . acetaminophen  650 mg Oral Q6H  . atorvastatin  80 mg Oral q1800  . cloNIDine  0.1 mg Oral BID  . clopidogrel  75 mg Oral Daily  . diclofenac sodium  2 g Topical QID  . famotidine  20 mg Oral Daily  . gabapentin  800 mg Oral BID  . insulin aspart  0-20 Units Subcutaneous TID WC  . insulin aspart  0-5 Units Subcutaneous QHS  . insulin glargine  30 Units Subcutaneous QHS  . mirtazapine  15 mg Oral QHS  . mometasone-formoterol  2 puff Inhalation BID  . nicotine  21 mg Transdermal Daily  . pantoprazole  40 mg Oral Daily  . sodium chloride flush  3 mL Intravenous Q12H   Infusions:  .  ceFAZolin (ANCEF) IV 1 g (06/30/18 0631)  . heparin 1,400 Units/hr (06/30/18 0241)    Assessment: 37 yo F on heparin for LE vasc disease s/p balloon angioplasty.  Heparin level is at lower end of goal on 1400 units/hr.  Rate was not increased yesterday as intended.  Hgb improved to 7.9.    Goal of Therapy:  Heparin level 0.3-0.7 units/ml Monitor platelets by  anticoagulation protocol: Yes   Plan:  Increase heparin to 1500 units/hr to maintain mid range of goal. Daily heparin level and CBC while on heparin.  [employer], Pharm.D., BCPS Clinical Pharmacist Pager: [phone] Clinical phone for 06/30/2018 from 8:30-4:00 is [phone].  **Pharmacist phone directory can now be found on [email or web address] ([email or web address]).  Listed under [place].  06/30/2018 11:08 AM

## 2018-06-30 NOTE — Progress Notes (Signed)
  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 Right foot continues to demarcate, there is stable streaking on the dorsum of the foot with a fully purple fourth toe and purplish discoloration to the other toes as well that is stable from exam yesterday  CBC    Component Value Date/Time   WBC 8.3 06/30/2018 0439   RBC 2.66 (L) 06/30/2018 0439   HGB 7.9 (L) 06/30/2018 0439   HCT 24.1 (L) 06/30/2018 0439   PLT 206 06/30/2018 0439   MCV 90.6 06/30/2018 0439   MCH 29.7 06/30/2018 0439   MCHC 32.8 06/30/2018 0439   RDW 12.3 06/30/2018 0439   LYMPHSABS 3.3 06/25/2018 0129   MONOABS 0.9 06/25/2018 0129   EOSABS 0.6 06/25/2018 0129   BASOSABS 0.1 06/25/2018 0129    BMET    Component Value Date/Time   NA 138 06/30/2018 0439   K 3.7 06/30/2018 0439   CL 99 06/30/2018 0439   CO2 28 06/30/2018 0439   GLUCOSE 179 (H) 06/30/2018 0439   BUN 11 06/30/2018 0439   CREATININE 1.68 (H) 06/30/2018 0439   CALCIUM 8.6 (L) 06/30/2018 0439   GFRNONAA 37 (L) 06/30/2018 0439   GFRAA 43 (L) 06/30/2018 0439    INR    Component Value Date/Time   INR 1.16 06/25/2018 0557     Intake/Output Summary (Last 24 hours) at 06/30/2018 0827 Last data filed at 06/30/2018 0631 Gross per 24 hour  Intake 1038.64 ml  Output 403 ml  Net 635.64 ml     Assessment/plan:  37 y.o. female is s/p drug-coated balloon angioplasty right popliteal artery.  She appears to have embolic disease to her foot which was similar to the previous foot.  We will continue to allow the foot to demarcate prior to any amputations but she will most certainly at least need a  right fourth toe amputation in the very near future.  Continue Plavix and anticoagulation.   Neelah Mannings C. Donzetta Matters, MD [facility] Office: [phone] Pager: [phone]  06/30/2018 8:27 AM

## 2018-06-30 NOTE — Plan of Care (Signed)
Continue plan of care resolving and adding as condition changes. Patient with nausea and vomiting. Very poor appetite. Resources needed at discharge for self and daughter.

## 2018-06-30 NOTE — Progress Notes (Addendum)
Pt again reported dysphagia this pm while trying to eat [employer] (similar to when attempting to eat pizza she ordered last night). States during daytime she did fine swallowing. Re-enforced pt to take small bites and go slow. Spoke w/ pt about diet and states she refuses all diet drinks and "diet" foods. PM cbg 157. Did agree to take her Lantus tonight. Will continue to monitor.  Jaymes Graff, RN

## 2018-06-30 NOTE — Progress Notes (Signed)
  Date: 06/30/2018  Patient name: Kathy Phelps  Medical record number: [id number]  Date of birth: 1981-08-05   I have seen and evaluated this patient and I have discussed the plan of care with the house staff. Please see their note for complete details. I concur with their findings with the following additions/corrections: MS Johal was seen on AM team rounds. We then discussed plans with her day RN.   1.  Nausea, vomiting, and anorexia -she states she has been unable to eat because eating makes the pain worse.  The pain is primarily epigastric and she says it is trying to hook up with the left flank pain that has been present since admission.  She is already on a PPI but also takes an H2 blocker at home.  We will resume her H2 blocker.  She asked about her pancreas and the team explained that her pancreas was normal on CT imaging on admission.  However, it has been 5 days since admission so we will check a lipase.  Other possible etiologies include gastroparesis as she is a poorly controlled diabetic and she is on opioids.  On leaving her room, I observed two pizza boxes and the nurse indicated it was delivered last night and she did eat pizza.  2.  Straining to urinat -she denies dysuria but does endorse urgency.  Her bladder scans revealed less than 200 cc of retained urine.  She remains afebrile and has no leukocytosis.  However, she will have had a Foley during her vascular procedure on the 17th.  We will observe her symptoms for 24 hours and reevaluate tomorrow to see if we need to obtain a UA to rule out a hospital acquired and therefore likely resistant UTI.  3.  RLE ischemia -we heard secondhand from the limbs vascular surgery wants her to be off IV pain meds.  We will work on getting her transition from IV Dilaudid to oral oxycodone as needed was scheduled acetaminophen.  Nonsteroidals are contraindicated due to her renal function.  She remains on heparin drip and Plavix.  Vascular is  following her foot to determine timing and type of amputation.  4.  Acute on chronic renal failure -her creatinine continues to trend down but is not yet at her baseline earlier this year.  Bartholomew Crews, MD 06/30/2018, 1:33 PM

## 2018-06-30 NOTE — Progress Notes (Signed)
During rounds pt found on bedpan straining to urinate. States this has been an ongoing issue during admission but "doesn't want to hear any more bad news about her kidneys" so she did not report this to anyone on her treatment team. States that eventually she will urinate "a lot" and feel relief. No bladder distention. Bladder scanned pt to reveal max of 143 mLs. Pt reports slight urge to pee. Pt wishes to re-scan bladder in an hour, as she does not have much in bladder. Helped off bedpan and readjusted in bed. Will continue to monitor.  Jaymes Graff, RN

## 2018-06-30 NOTE — Progress Notes (Signed)
Patient with nausea and vomiting this am. Patient agreed to take Pepcid, Protonix and Dilaudid but other medications held as patient states that she doesn't feel well enough to take at this time.  Daughter sleeping at bedside. Patient expressed concerns about living situation as she stated that she is homeless and living in a hotel. Daughter has not been to school as a result of frequent moves and busing availability. 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  Spoke with SW as to patient situation as resources to assist her and daughter.

## 2018-07-01 DIAGNOSIS — R339 Retention of urine, unspecified: Secondary | ICD-10-CM

## 2018-07-01 DIAGNOSIS — M549 Dorsalgia, unspecified: Secondary | ICD-10-CM

## 2018-07-01 LAB — BASIC METABOLIC PANEL
ANION GAP: 9 (ref 5–15)
BUN: 11 mg/dL (ref 6–20)
CO2: 25 mmol/L (ref 22–32)
Calcium: 8.2 mg/dL — ABNORMAL LOW (ref 8.9–10.3)
Chloride: 102 mmol/L (ref 98–111)
Creatinine, Ser: 1.71 mg/dL — ABNORMAL HIGH (ref 0.44–1.00)
GFR calc Af Amer: 42 mL/min — ABNORMAL LOW (ref >60)
GFR calc non Af Amer: 37 mL/min — ABNORMAL LOW (ref >60)
Glucose, Bld: 155 mg/dL — ABNORMAL HIGH (ref 70–99)
Potassium: 3.5 mmol/L (ref 3.5–5.1)
Sodium: 136 mmol/L (ref 135–145)

## 2018-07-01 LAB — GLUCOSE, CAPILLARY
GLUCOSE-CAPILLARY: 167 mg/dL — AB (ref 70–99)
GLUCOSE-CAPILLARY: 147 mg/dL — AB (ref 70–99)
GLUCOSE-CAPILLARY: 151 mg/dL — AB (ref 70–99)
Glucose-Capillary: 165 mg/dL — ABNORMAL HIGH (ref 70–99)

## 2018-07-01 LAB — HEPARIN LEVEL (UNFRACTIONATED): Heparin Unfractionated: 0.35 IU/mL (ref 0.30–0.70)

## 2018-07-01 LAB — CBC
HEMATOCRIT: 21.5 % — AB (ref 36.0–46.0)
Hemoglobin: 7 g/dL — ABNORMAL LOW (ref 12.0–15.0)
MCH: 29.9 pg (ref 26.0–34.0)
MCHC: 32.6 g/dL (ref 30.0–36.0)
MCV: 91.9 fL (ref 78.0–100.0)
Platelets: 187 10*3/uL (ref 150–400)
RBC: 2.34 MIL/uL — AB (ref 3.87–5.11)
RDW: 12.5 % (ref 11.5–15.5)
WBC: 8.5 10*3/uL (ref 4.0–10.5)

## 2018-07-01 MED ORDER — HYDROMORPHONE HCL 1 MG/ML IJ SOLN
0.5000 mg | INTRAMUSCULAR | Status: DC | PRN
  Administered 2018-07-01 – 2018-07-02 (×3): 0.5 mg via INTRAVENOUS
  Filled 2018-07-01 (×4): qty 1

## 2018-07-01 MED ORDER — BUSPIRONE HCL 5 MG PO TABS
15.0000 mg | ORAL_TABLET | Freq: Three times a day (TID) | ORAL | Status: DC
  Administered 2018-07-01 – 2018-07-03 (×6): 15 mg via ORAL
  Filled 2018-07-01 (×6): qty 1

## 2018-07-01 NOTE — Progress Notes (Signed)
Pt brought back up by security. Per security pt not allowed off floor with IV pump. MD has been paged, because pt requesting to come off IV to go back outside. Pt threatening to leave AMA if cannot go outside off pump.  Amanda Cockayne, RN

## 2018-07-01 NOTE — Progress Notes (Signed)
07/01/2018 0830 Pt with orders to go outside with daughter. Daughter brought outside via wheelchair. Pt off floor at this time. Per pt said she would be gone 15 min. Amanda Cockayne, RN

## 2018-07-01 NOTE — Progress Notes (Signed)
    Subjective  -   Multiple issues this am:  Can't swallow, can't urinate, foot hurts   Physical Exam:  Continues to have ischemic changes to right forefoot with palpable DP pulse       Assessment/Plan:    Continue to let foot demarcate Continue IV heparin Will have to make a decision regarding managemnt of the foot in a few days OK to go outside in a wheelchair with her daughter  Annamarie Major 07/01/2018 7:52 AM --  Vitals:   06/30/18 2008 07/01/18 0435  BP: 134/89 119/84  Pulse: 76 77  Resp: 19 18  Temp: 98.3 F (36.8 C) 97.9 F (36.6 C)  SpO2: 100% 100%    Intake/Output Summary (Last 24 hours) at 07/01/2018 0752 Last data filed at 07/01/2018 0533 Gross per 24 hour  Intake 543.21 ml  Output 728 ml  Net -184.79 ml     Laboratory CBC    Component Value Date/Time   WBC 8.5 07/01/2018 0317   HGB 7.0 (L) 07/01/2018 0317   HCT 21.5 (L) 07/01/2018 0317   PLT 187 07/01/2018 0317    BMET    Component Value Date/Time   NA 136 07/01/2018 0317   K 3.5 07/01/2018 0317   CL 102 07/01/2018 0317   CO2 25 07/01/2018 0317   GLUCOSE 155 (H) 07/01/2018 0317   BUN 11 07/01/2018 0317   CREATININE 1.71 (H) 07/01/2018 0317   CALCIUM 8.2 (L) 07/01/2018 0317   GFRNONAA 37 (L) 07/01/2018 0317   GFRAA 42 (L) 07/01/2018 0317    COAG Lab Results  Component Value Date   INR 1.16 06/25/2018   INR 1.32 09/05/2017   INR 0.91 08/10/2017   No results found for: PTT  Antibiotics Anti-infectives (From admission, onward)   Start     Dose/Rate Route Frequency Ordered Stop   06/29/18 1400  ceFAZolin (ANCEF) IVPB 1 g/50 mL premix     1 g 100 mL/hr over 30 Minutes Intravenous Every 8 hours 06/29/18 1057     06/27/18 1200  ceFEPIme (MAXIPIME) 2 g in sodium chloride 0.9 % 100 mL IVPB  Status:  Discontinued     2 g 200 mL/hr over 30 Minutes Intravenous Daily 06/27/18 0658 06/29/18 1053   06/26/18 0600  vancomycin (VANCOCIN) IVPB 1000 mg/200 mL premix  Status:   Discontinued     1,000 mg 200 mL/hr over 60 Minutes Intravenous Every 24 hours 06/25/18 0708 06/29/18 1045   06/25/18 0600  vancomycin (VANCOCIN) IVPB 1000 mg/200 mL premix     1,000 mg 200 mL/hr over 60 Minutes Intravenous  Once 06/25/18 0545 06/25/18 0759   06/25/18 0600  piperacillin-tazobactam (ZOSYN) IVPB 3.375 g  Status:  Discontinued     3.375 g 12.5 mL/hr over 240 Minutes Intravenous Every 8 hours 06/25/18 0556 06/27/18 0714       V. Leia Alf, M.D. [facility] Office: [phone] Pager:  [phone]

## 2018-07-01 NOTE — Progress Notes (Signed)
Subjective:  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 Currently hospital day7.  Patient seen at bedside this AM. She had urinary retention overnight with 300cc remaining post void. Her pain has remained stable, but not adequately controlled at 8/10. She had tried to go smoke a cigarette this AM but security would not allowed her to go out while on IV medications. She threatened to leave AMA, but was informed that her heparin was to important to be stopped and she was agreeable to staying in the hospital. She state she continues to have vaginal bleeding, but this remains stable.  Objective:  Vital signs in last 24 hours: Vitals:   06/30/18 1120 06/30/18 1230 06/30/18 2008 07/01/18 0435  BP: (!) 152/91 124/83 134/89 119/84  Pulse:  81 76 77  Resp:  18 19 18   Temp:  98.7 F (37.1 C) 98.3 F (36.8 C) 97.9 F (36.6 C)  TempSrc:  Oral Oral Oral  SpO2:  100% 100% 100%  Weight:      Height:       Physical Exam  Constitutional: She is oriented to person, place, and time. She appears well-developed and well-nourished.  Mild distress  HENT:  Head: Normocephalic and atraumatic.  Eyes: Conjunctivae and EOM are normal.  Cardiovascular: Normal rate, regular rhythm, normal heart sounds and intact distal pulses.  Pulses:      Radial pulses are 2+ on the right side, and 2+ on the left side.       Dorsalis pedis pulses are 1+ on the right side.  Pulmonary/Chest: Effort normal and breath sounds normal. No respiratory distress.  Abdominal: Soft. Bowel sounds are normal. She exhibits no distension. There is tenderness (L flank pain).  Musculoskeletal:  S/P L BKA, clean stitches in place RLE w/ erythema at dorsum w/ faint streaking  erythema to mid shins. R 4th toe appears necrotic with purple discoloration of 3rd and 5th toe as well.  Neurological: She is alert and oriented to person, place, and time.  Decreased sensation of RLE w/ absent sensation of R foot.  Skin: Skin is warm and dry.  RLE mildly cool at foot   Assessment/Plan:  Principal Problem:   Lower limb ischemia Active Problems:   Type II diabetes mellitus with renal manifestations, uncontrolled (HCC)   Tobacco abuse   Essential hypertension   AKI (acute kidney injury) ([place])   Cellulitis   Lymphangitis   Menorrhagia  This is a 37 y.o Caucasian Female w/ a PMHx of uncontrolled T2DM, 25 pack year 1ppd smoker, HTN & dyslipidemia presenting w/ RLE ischemias/p day 3 R popliteal artery atherectomy & balloon angioplasty.Her R DP pulse today continues to exhibits a weak pulse and her discoloration seems unchanged from yesterday. Currently hospital day6  RLE ischemia s/p R popliteal atherectomy & baloon angioplasty on 9/17. Her R DP pulse remains (1+) and purple discoloration remains at R 4th toe and plantar aspect 3rd and 5th toes.  Erythema remain on dorsal surface of RLE. RLE remains managed w/ current pain regimen. -AppreciateVasc Sg recommendations - Continue to monitor R toes w/ possible amputation - Continue Plavix - Continue Cefazolin 1g q8 - Heparin to 1500 unit/hr - Wean off of IV Dilaudid (now 0.5 q4h) - Continue Oxycodone IR 5mg   q4h PRN - Swap Acetaminophen 650mg  q6h  Lbackpain, Likely 2/2 MKS pain.The voltaren gel along w/ the pain medications has been managing her pain. Initial CT abd revealed no pancreatitis but we will investigate this further. Will consider muscle relaxant if current regimen becomes ineffective. > GERD like symptoms improving, Lipase WNL - Continue Voltaren gel - Wean off IV Dilaudid - Continue Oxycodone and APAP as above - Continue PPI and Famotidine  Urinary Retention / Difficulty Urinating: Unclear etiology,  possibly worsened by foley during procedure and opioid pain medications. Patient reports some symptoms PTA, admit UA not suspicious for UTI. > Bladder scan showed ~600cc pre void and 300cc post void - Check daily bladder scans - Wean Dilauded  Acute Menorrhagiaof unknown etiology. HgB has been slowly trending down 7.0 this AM. Remains on IV heparin for limb ischemia. We still have plans for her to f/u w/ her outpt GYN for further evaluation of her bleeding. - Monitor CBC - Transfuse < 7  ?AKI CKDIII:Cr continues to improve, now~1.7 (baseline ~1.5) - Monitor AM BMP  Uncontrolled T2DM, insulin dependent, w/ neuropathy.BS remains in the mid 100s while on Lantus 30 units. - Continue30units Lantus - ContinueresistantSSI - Check daily blood sugars - Continue Neurontin 800mg  cap BID  Essential HTN. Pt's blood pressure has been hovering from the 511-021R systolic. Due to her Hx of kidney disease, consider starting an ACE-Iin the future. - Continue Clonidine  Hx of Dyslipidemia. - Continue 80mg  Atorvastatin  Dispo: Anticipated discharge in approximately 4-5 days.   [name], MD 07/01/2018, 7:03 AM Pager: [phone]

## 2018-07-01 NOTE — Progress Notes (Addendum)
ANTICOAGULATION CONSULT NOTE - Follow Up Consult  Pharmacy Consult for Heparin Indication: peripheral vasc disease, RLE ischemia  Allergies  Allergen Reactions  . Hydrocodone-Acetaminophen Hives  . Tylenol [Acetaminophen] Itching and Swelling    Patient tolerated APAP 650 mg (06/29/18) as well as oxycodone/APAP 5-325 mg during 06/25/2018 admission.    Patient Measurements: Height: 5\' 9"  (175.3 cm) Weight: 160 lb 0.9 oz (72.6 kg) IBW/kg (Calculated) : 66.2 Heparin Dosing Weight: 72 kg  Vital Signs: Temp: 97.9 F (36.6 C) (09/21 0435) Temp Source: Oral (09/21 0435) BP: 119/84 (09/21 0435) Pulse Rate: 77 (09/21 0435)  Labs: Recent Labs    06/29/18 0318 06/30/18 0439 07/01/18 0317  HGB 7.1* 7.9* 7.0*  HCT 21.7* 24.1* 21.5*  PLT 169 206 187  HEPARINUNFRC 0.29* 0.34 0.35  CREATININE 1.81* 1.68* 1.71*    Estimated Creatinine Clearance: 46.2 mL/min (A) (by C-G formula based on SCr of 1.71 mg/dL (H)).   Medications:  Scheduled:  . acetaminophen  650 mg Oral Q6H  . atorvastatin  80 mg Oral q1800  . cloNIDine  0.1 mg Oral BID  . clopidogrel  75 mg Oral Daily  . diclofenac sodium  2 g Topical QID  . famotidine  20 mg Oral Daily  . gabapentin  800 mg Oral BID  . insulin aspart  0-20 Units Subcutaneous TID WC  . insulin aspart  0-5 Units Subcutaneous QHS  . insulin glargine  30 Units Subcutaneous QHS  . mirtazapine  15 mg Oral QHS  . mometasone-formoterol  2 puff Inhalation BID  . nicotine  21 mg Transdermal Daily  . pantoprazole  40 mg Oral Daily  . sodium chloride flush  3 mL Intravenous Q12H   Infusions:  .  ceFAZolin (ANCEF) IV Stopped (07/01/18 0603)  . heparin 1,500 Units/hr (06/30/18 1916)    Assessment: 37 yo F on heparin for LE vasc disease s/p balloon angioplasty. Heparin level remains at goal. Hg down to 7, plt wnl. Menorrhagia this admit (not noted to be worsening per documentation) with plans to f/u outpatient with GYN per MD. Vascular to make a decision  on management of foot in a few days per notes.  Goal of Therapy:  Heparin level 0.3-0.7 units/ml Monitor platelets by anticoagulation protocol: Yes   Plan:  Continue heparin at 1500 units/hr Monitor daily heparin level and CBC, s/sx bleeding F/u Vascular plans  Elicia Lamp, PharmD, BCPS Clinical Pharmacist Clinical phone [phone] Please check AMION for all [facility] contact numbers 07/01/2018 10:37 AM

## 2018-07-01 NOTE — Progress Notes (Addendum)
AM bladder scan revealed 602 mLs. Pt denies urge to urinate but was placed on bedpan to try. Continues w/ significant straining, takes about 10-15 minutes to fully void. Produced 350 mLs of clear, yellow urine. Post void scan 322 mLs. Pt denies any bladder discomfort, dysuria, burning sensation, urgency, etc. Will pass along to AM team.  Jaymes Graff, RN

## 2018-07-02 DIAGNOSIS — R1310 Dysphagia, unspecified: Secondary | ICD-10-CM

## 2018-07-02 LAB — CBC
HCT: 22.6 % — ABNORMAL LOW (ref 36.0–46.0)
Hemoglobin: 7.4 g/dL — ABNORMAL LOW (ref 12.0–15.0)
MCH: 30.3 pg (ref 26.0–34.0)
MCHC: 32.7 g/dL (ref 30.0–36.0)
MCV: 92.6 fL (ref 78.0–100.0)
Platelets: 220 10*3/uL (ref 150–400)
RBC: 2.44 MIL/uL — ABNORMAL LOW (ref 3.87–5.11)
RDW: 12.6 % (ref 11.5–15.5)
WBC: 7.8 10*3/uL (ref 4.0–10.5)

## 2018-07-02 LAB — HEPARIN LEVEL (UNFRACTIONATED)
Heparin Unfractionated: 0.17 IU/mL — ABNORMAL LOW (ref 0.30–0.70)
Heparin Unfractionated: 0.29 IU/mL — ABNORMAL LOW (ref 0.30–0.70)
Heparin Unfractionated: 0.5 IU/mL (ref 0.30–0.70)

## 2018-07-02 LAB — GLUCOSE, CAPILLARY
GLUCOSE-CAPILLARY: 171 mg/dL — AB (ref 70–99)
Glucose-Capillary: 155 mg/dL — ABNORMAL HIGH (ref 70–99)
Glucose-Capillary: 198 mg/dL — ABNORMAL HIGH (ref 70–99)
Glucose-Capillary: 167 mg/dL — ABNORMAL HIGH (ref 70–99)

## 2018-07-02 LAB — URINALYSIS, ROUTINE W REFLEX MICROSCOPIC
BILIRUBIN URINE: NEGATIVE
Bacteria, UA: NONE SEEN
Glucose, UA: 50 mg/dL — AB
KETONES UR: NEGATIVE mg/dL
Leukocytes, UA: NEGATIVE
Nitrite: NEGATIVE
PH: 5 (ref 5.0–8.0)
Protein, ur: 30 mg/dL — AB
Specific Gravity, Urine: 1.013 (ref 1.005–1.030)

## 2018-07-02 LAB — BASIC METABOLIC PANEL
Anion gap: 9 (ref 5–15)
BUN: 9 mg/dL (ref 6–20)
CO2: 28 mmol/L (ref 22–32)
CREATININE: 1.72 mg/dL — AB (ref 0.44–1.00)
Calcium: 8.1 mg/dL — ABNORMAL LOW (ref 8.9–10.3)
Chloride: 102 mmol/L (ref 98–111)
GFR calc Af Amer: 42 mL/min — ABNORMAL LOW (ref >60)
GFR, EST NON AFRICAN AMERICAN: 36 mL/min — AB (ref >60)
GLUCOSE: 185 mg/dL — AB (ref 70–99)
Potassium: 3.6 mmol/L (ref 3.5–5.1)
Sodium: 139 mmol/L (ref 135–145)

## 2018-07-02 MED ORDER — HYDROMORPHONE HCL 1 MG/ML IJ SOLN
0.5000 mg | Freq: Three times a day (TID) | INTRAMUSCULAR | Status: DC | PRN
  Administered 2018-07-02 – 2018-07-03 (×3): 0.5 mg via INTRAVENOUS
  Filled 2018-07-02 (×2): qty 1

## 2018-07-02 MED ORDER — OXYCODONE HCL 5 MG PO TABS
10.0000 mg | ORAL_TABLET | ORAL | Status: DC | PRN
  Administered 2018-07-02 – 2018-07-03 (×5): 10 mg via ORAL
  Filled 2018-07-02 (×5): qty 2

## 2018-07-02 NOTE — Progress Notes (Addendum)
ANTICOAGULATION CONSULT NOTE - Follow Up Consult  Pharmacy Consult for Heparin Indication: peripheral vasc disease, RLE ischemia  Allergies  Allergen Reactions  . Hydrocodone-Acetaminophen Hives  . Tylenol [Acetaminophen] Itching and Swelling    Patient tolerated APAP 650 mg (06/29/18) as well as oxycodone/APAP 5-325 mg during 06/25/2018 admission.    Patient Measurements: Height: 5\' 9"  (175.3 cm) Weight: 160 lb 0.9 oz (72.6 kg) IBW/kg (Calculated) : 66.2 Heparin Dosing Weight: 72 kg  Vital Signs: Temp: 97.8 F (36.6 C) (09/22 0426) Temp Source: Oral (09/22 0426) BP: 151/91 (09/22 0426) Pulse Rate: 77 (09/22 0426)  Labs: Recent Labs    06/30/18 0439 07/01/18 0317 07/02/18 0400 07/02/18 1244  HGB 7.9* 7.0* 7.4*  --   HCT 24.1* 21.5* 22.6*  --   PLT 206 187 220  --   HEPARINUNFRC 0.34 0.35 0.17* 0.29*  CREATININE 1.68* 1.71* 1.72*  --     Estimated Creatinine Clearance: 45.9 mL/min (A) (by C-G formula based on SCr of 1.72 mg/dL (H)).   Medications:  Scheduled:  . acetaminophen  650 mg Oral Q6H  . atorvastatin  80 mg Oral q1800  . busPIRone  15 mg Oral TID  . cloNIDine  0.1 mg Oral BID  . clopidogrel  75 mg Oral Daily  . diclofenac sodium  2 g Topical QID  . famotidine  20 mg Oral Daily  . gabapentin  800 mg Oral BID  . insulin aspart  0-20 Units Subcutaneous TID WC  . insulin aspart  0-5 Units Subcutaneous QHS  . insulin glargine  30 Units Subcutaneous QHS  . mirtazapine  15 mg Oral QHS  . mometasone-formoterol  2 puff Inhalation BID  . nicotine  21 mg Transdermal Daily  . pantoprazole  40 mg Oral Daily  . sodium chloride flush  3 mL Intravenous Q12H   Infusions:  .  ceFAZolin (ANCEF) IV Stopped (07/02/18 1309)  . heparin 1,600 Units/hr (07/02/18 1333)    Assessment: 37 yo F on heparin for LE vasc disease s/p balloon angioplasty. Heparin level now slightly below goal at 0.29 after rate increases. Hg low stable 7.4, plt wnl. Menorrhagia this admit (not  noted to be worsening per documentation) with plans to f/u outpatient with GYN per MD. Vascular to make a decision on management of foot in a few days per notes. Per discussion with RN, no issues with infusion or bleeding currently.  Goal of Therapy:  Heparin level 0.3-0.7 units/ml Monitor platelets by anticoagulation protocol: Yes   Plan:  Increase heparin to 1700 units/hr 6h heparin level Monitor daily heparin level and CBC, s/sx bleeding F/u Vascular plans - likely to require surgery intervention on R foot next week per notes  Elicia Lamp, PharmD, BCPS Clinical Pharmacist Clinical phone [phone] Please check AMION for all [place] contact numbers 07/02/2018 1:58 PM   ADDENDUM: Heparin level now therapeutic. No bleed documented.  Plan: Continue heparin at 1700 units/hr Confirmatory heparin level with AM labs Monitor daily heparin level and CBC, s/sx bleeding F/u Vascular plans - likely to require surgery intervention on R foot next week per notes  Elicia Lamp, PharmD, BCPS Please check AMION for all [place] contact numbers Clinical Pharmacist 07/02/2018 10:01 PM

## 2018-07-02 NOTE — Progress Notes (Signed)
Pt spontaneously voided 525 mLs. Bladder scan post void 431. Pt denies urge to urinate or other related symptoms. Still has to strain to urinate.

## 2018-07-02 NOTE — Progress Notes (Signed)
Patient's post-void bladder scan showed 393 ml left in bladder.

## 2018-07-02 NOTE — Progress Notes (Signed)
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  Patient seen at bedside this AM. She had some urinary retention again this AM. Pain has remained stable, she reports 8/10, but does not appear uncomfortable. She continues to have vaginal bleeding, but this remains stable. Updated on Hgb and renal function this AM. No further questions.  Objective:  Vital signs in last 24 hours: Vitals:   06/30/18 2008 07/01/18 0435 07/01/18 2210 07/02/18 0426  BP: 134/89 119/84 (!) 139/92 (!) 151/91  Pulse: 76 77 80 77  Resp: 19 18 15 15   Temp: 98.3 F (36.8 C) 97.9 F (36.6 C) 98.3 F (36.8 C) 97.8 F (36.6 C)  TempSrc: Oral Oral Oral Oral  SpO2: 100% 100% 100% 100%  Weight:      Height:       Physical Exam  Constitutional: She is oriented to person, place, and time. She appears well-developed and well-nourished. No distress.  HENT:  Head: Normocephalic and atraumatic.  Eyes: Conjunctivae and EOM are normal.  Cardiovascular: Normal rate, regular rhythm and normal heart sounds.  Pulses:      Radial pulses are 2+ on the right side, and 2+ on the left side.       Dorsalis pedis pulses are 1+ on the right side.  Pulmonary/Chest: Effort normal and breath sounds normal. No respiratory distress.  Abdominal: Soft. Bowel sounds are normal. She exhibits no distension. There is tenderness (L flank pain).  Musculoskeletal:  S/P L BKA, clean stitches in place RLE w/ erythema at dorsum w/ faint streaking erythema to mid shins. R 4th toe appears necrotic with purple discoloration of 3rd and 5th toe as well. Some dusky discoloration of first toe.  Neurological: She is alert and oriented to person, place, and time.  Decreased sensation of RLE w/  absent sensation of R foot.  Skin: Skin is warm and dry.  RLE mildly cool at foot   Assessment/Plan:  Principal Problem:   Lower limb ischemia Active Problems:   Type II diabetes mellitus with renal manifestations, uncontrolled (HCC)   Tobacco abuse   Essential hypertension   AKI (acute kidney injury) ([place])   Cellulitis   Lymphangitis   Menorrhagia  This is a 37 y.o Caucasian Female w/ a PMHx of uncontrolled T2DM, 25 pack year 1ppd smoker, HTN & dyslipidemia presenting w/ RLE ischemias/p R popliteal artery atherectomy & balloon angioplasty.  RLE ischemia s/p R popliteal atherectomy & baloon angioplasty on 9/17. Her R DP pulse remains (1+) and purple discoloration remains at R 4th toe and plantar aspect 3rd and 5th toes. 1st digit now appearing dusky. Erythema remain on dorsal surface of RLE. RLE remains managed w/ current pain regimen. -AppreciateVasc Sg recommendations - Continue to monitor R toes w/ possible amputation - Continue Plavix - Continue Cefazolin 1g q8 - Heparin to 1500 unit/hr - Wean off of IV Dilaudid (now 0.5 q8h) - Continue Oxycodone IR 5mg  q4h PRN - Acetaminophen 650mg  q6h  Lbackpain, Likely 2/2 MKS pain.The voltaren gel along w/ the pain medications has been managing her pain. Initial CT abd revealed no pancreatitis and follow up lipase negative. Will consider muscle relaxant if current regimen becomes ineffective. > GERD like symptoms improved - Continue Voltaren gel - Wean off IV Dilaudid -  Continue Oxycodone and APAP as above - Continue PPI and Famotidine - Obtaining repeat U/A  Urinary Retention / Difficulty Urinating: Unclear etiology, possibly worsened by foley during procedure and opioid pain medications. Patient reports some symptoms PTA, admit UA not suspicious for UTI. > Bladder scan showed ~600cc pre void and 300cc post void - Check post void residuals, I&O cath for PVR >300 - Wean Dilaudid - Repeat U/A  Acute Menorrhagiaof unknown  etiology. HgB has been slowly trending down 7.4 this AM. Remains on IV heparin for limb ischemia. We still have plans for her to f/u w/ her outpt GYN for further evaluation of her bleeding. - Monitor CBC - Transfuse < 7  ?AKI CKDIII:Cr stable: ~1.7 (?baseline ~1.5); may be at new baseline - Monitor AM BMP  Uncontrolled T2DM, insulin dependent, w/ neuropathy.BS remains in the mid 100s while on Lantus 30 units. - Continue30units Lantus - ContinueresistantSSI - Check daily blood sugars - Continue Neurontin 800mg  cap BID  Essential HTN. Pt's blood pressure has been hovering from the 947-096G systolic. Due to her Hx of kidney disease, consider starting an ACE-Iin the future. - Continue Clonidine  Hx of Dyslipidemia. - Continue 80mg  Atorvastatin  Dispo: Anticipated discharge in approximately 4-5 days.   [name], MD 07/02/2018, 10:19 AM Pager: [phone]

## 2018-07-02 NOTE — Progress Notes (Addendum)
  Progress Note    07/02/2018 8:43 AM 5 Days Post-Op  Subjective:  Complains of R foot pain   Vitals:   07/01/18 2210 07/02/18 0426  BP: (!) 139/92 (!) 151/91  Pulse: 80 77  Resp: 15 15  Temp: 98.3 F (36.8 C) 97.8 F (36.6 C)  SpO2: 100% 100%   Physical Exam: Lungs:  Non labored Incisions:  L BKA unremarkable Extremities:  Palpable R DP; R foot demarcating; 4th toe gangrene Abdomen:  Soft  Neurologic: A&O  CBC    Component Value Date/Time   WBC 7.8 07/02/2018 0400   RBC 2.44 (L) 07/02/2018 0400   HGB 7.4 (L) 07/02/2018 0400   HCT 22.6 (L) 07/02/2018 0400   PLT 220 07/02/2018 0400   MCV 92.6 07/02/2018 0400   MCH 30.3 07/02/2018 0400   MCHC 32.7 07/02/2018 0400   RDW 12.6 07/02/2018 0400   LYMPHSABS 3.3 06/25/2018 0129   MONOABS 0.9 06/25/2018 0129   EOSABS 0.6 06/25/2018 0129   BASOSABS 0.1 06/25/2018 0129    BMET    Component Value Date/Time   NA 139 07/02/2018 0400   K 3.6 07/02/2018 0400   CL 102 07/02/2018 0400   CO2 28 07/02/2018 0400   GLUCOSE 185 (H) 07/02/2018 0400   BUN 9 07/02/2018 0400   CREATININE 1.72 (H) 07/02/2018 0400   CALCIUM 8.1 (L) 07/02/2018 0400   GFRNONAA 36 (L) 07/02/2018 0400   GFRAA 42 (L) 07/02/2018 0400    INR    Component Value Date/Time   INR 1.16 06/25/2018 0557     Intake/Output Summary (Last 24 hours) at 07/02/2018 0843 Last data filed at 07/02/2018 0600 Gross per 24 hour  Intake 858.48 ml  Output 525 ml  Net 333.48 ml     Assessment/Plan:  36 y.o. female is s/p RLE arteriogram with popliteal atherectomy 5 Days Post-Op   Continue IV heparin Palpable R DP; R foot demarcating Pain medicine regimen adjusted Likely to require surgical intervention R foot early next week   Dagoberto Ligas, PA-C [facility] [phone] 07/02/2018 8:43 AM   Agree with above Will change oxycodone to 10mg  per dose Right foot continuing to demarcate  Wells Brabham

## 2018-07-02 NOTE — Progress Notes (Signed)
ANTICOAGULATION CONSULT NOTE - Follow Up Consult  Pharmacy Consult for Heparin Indication: peripheral vasc disease, RLE ischemia  Allergies  Allergen Reactions  . Hydrocodone-Acetaminophen Hives  . Tylenol [Acetaminophen] Itching and Swelling    Patient tolerated APAP 650 mg (06/29/18) as well as oxycodone/APAP 5-325 mg during 06/25/2018 admission.    Patient Measurements: Height: 5\' 9"  (175.3 cm) Weight: 160 lb 0.9 oz (72.6 kg) IBW/kg (Calculated) : 66.2 Heparin Dosing Weight: 72 kg  Vital Signs: Temp: 97.8 F (36.6 C) (09/22 0426) Temp Source: Oral (09/22 0426) BP: 151/91 (09/22 0426) Pulse Rate: 77 (09/22 0426)  Labs: Recent Labs    06/30/18 0439 07/01/18 0317 07/02/18 0400  HGB 7.9* 7.0* 7.4*  HCT 24.1* 21.5* 22.6*  PLT 206 187 220  HEPARINUNFRC 0.34 0.35 0.17*  CREATININE 1.68* 1.71*  --     Estimated Creatinine Clearance: 46.2 mL/min (A) (by C-G formula based on SCr of 1.71 mg/dL (H)).    Infusions:  .  ceFAZolin (ANCEF) IV 1 g (07/02/18 0640)  . heparin 1,500 Units/hr (07/02/18 0315)    Assessment: 37 yo F on heparin for LE vasc disease s/p balloon angioplasty. Heparin level remains at goal. Hg down to 7, plt wnl. Menorrhagia this admit (not noted to be worsening per documentation) with plans to f/u outpatient with GYN per MD. Vascular to make a decision on management of foot in a few days per notes. Heparin level this am 0.17 units/ml  Goal of Therapy:  Heparin level 0.3-0.7 units/ml Monitor platelets by anticoagulation protocol: Yes   Plan:  Increase heparin to 1600 units/hr Check heparin level in 6 hours Monitor daily heparin level and CBC, s/sx bleeding F/u Vascular plans  Thanks for allowing pharmacy to be a part of this patient's care.  Excell Seltzer, PharmD Clinical Pharmacist

## 2018-07-03 LAB — BASIC METABOLIC PANEL
Anion gap: 10 (ref 5–15)
BUN: 10 mg/dL (ref 6–20)
CO2: 26 mmol/L (ref 22–32)
Calcium: 8.2 mg/dL — ABNORMAL LOW (ref 8.9–10.3)
Chloride: 102 mmol/L (ref 98–111)
Creatinine, Ser: 1.85 mg/dL — ABNORMAL HIGH (ref 0.44–1.00)
GFR calc Af Amer: 39 mL/min — ABNORMAL LOW (ref >60)
GFR calc non Af Amer: 33 mL/min — ABNORMAL LOW (ref >60)
Glucose, Bld: 160 mg/dL — ABNORMAL HIGH (ref 70–99)
Potassium: 3.4 mmol/L — ABNORMAL LOW (ref 3.5–5.1)
SODIUM: 138 mmol/L (ref 135–145)

## 2018-07-03 LAB — CBC
HCT: 26.7 % — ABNORMAL LOW (ref 36.0–46.0)
HEMOGLOBIN: 8.5 g/dL — AB (ref 12.0–15.0)
MCH: 30 pg (ref 26.0–34.0)
MCHC: 31.8 g/dL (ref 30.0–36.0)
MCV: 94.3 fL (ref 78.0–100.0)
Platelets: 253 10*3/uL (ref 150–400)
RBC: 2.83 MIL/uL — ABNORMAL LOW (ref 3.87–5.11)
RDW: 12.9 % (ref 11.5–15.5)
WBC: 9.6 10*3/uL (ref 4.0–10.5)

## 2018-07-03 LAB — HEPARIN LEVEL (UNFRACTIONATED): Heparin Unfractionated: 0.6 IU/mL (ref 0.30–0.70)

## 2018-07-03 LAB — GLUCOSE, CAPILLARY: Glucose-Capillary: 174 mg/dL — ABNORMAL HIGH (ref 70–99)

## 2018-07-03 MED ORDER — INSULIN GLARGINE 100 UNIT/ML ~~LOC~~ SOLN: 35.0000 [IU] | Freq: Every day | SUBCUTANEOUS | Status: DC

## 2018-07-03 MED ORDER — ATORVASTATIN CALCIUM 80 MG PO TABS: 80.0000 mg | ORAL_TABLET | Freq: Every day | ORAL | 0 refills | Status: DC

## 2018-07-03 MED ORDER — POTASSIUM CHLORIDE CRYS ER 20 MEQ PO TBCR
40.0000 meq | EXTENDED_RELEASE_TABLET | Freq: Once | ORAL | Status: AC
  Administered 2018-07-03: 40 meq via ORAL
  Filled 2018-07-03: qty 2

## 2018-07-03 MED ORDER — CEPHALEXIN 250 MG PO CAPS: 250.0000 mg | ORAL_CAPSULE | Freq: Four times a day (QID) | ORAL | 0 refills | Status: AC

## 2018-07-03 MED ORDER — ASPIRIN EC 81 MG PO TBEC: 81.0000 mg | DELAYED_RELEASE_TABLET | Freq: Every day | ORAL | 2 refills | Status: AC

## 2018-07-03 MED ORDER — OXYCODONE HCL 10 MG PO TABS: 10.0000 mg | ORAL_TABLET | ORAL | 0 refills | Status: AC | PRN

## 2018-07-03 NOTE — Progress Notes (Signed)
Called by RN and notified that patient would be signing out AMA due to issues with her children's school. Went to see patient, who stated that her children had initially missed school due to an issue with their home and them having to stay in a hotel. They have been living with their father the last several days with a bus arranged for school; but their father goes to work too early to drop them off at school. The 2 children have not been going to school and she was contacted by the schooling notifying her of their delinquency and that the school's guidance counselor would be going out to the house to evaluate. Patient states she has to get to the house to meet them and make sure her children start going to school or she is at risk of them being taken away. She states she will try to find someone else to help out and will return if she does.  Pearson Grippe, DO IM PGY-2

## 2018-07-03 NOTE — Progress Notes (Signed)
RT NOTES: Entered patient's room to administer [facility]. Patient states she does not need it. Will continue to monitor respiratory status.

## 2018-07-03 NOTE — Progress Notes (Signed)
Patient ID: Kathy Phelps, female   DOB: [birth year], 37 y.o.   MRN: [id number] Patient is seen in follow-up for her left transtibial amputation.  The incision is healing well will write orders to harvest the suture she will need a stump shrinker will set this up with [employer].  Patient has black ischemic changes to toes 3 4 and 5 on the right foot.  She is being followed by vascular surgery status post revascularization to the right lower extremity.

## 2018-07-03 NOTE — Progress Notes (Signed)
ANTICOAGULATION CONSULT NOTE - Follow Up Consult  Pharmacy Consult for Heparin Indication: peripheral vasc disease, RLE ischemia  Allergies  Allergen Reactions  . Hydrocodone-Acetaminophen Hives  . Tylenol [Acetaminophen] Itching and Swelling    Patient tolerated APAP 650 mg (06/29/18) as well as oxycodone/APAP 5-325 mg during 06/25/2018 admission.    Patient Measurements: Height: 5\' 9"  (175.3 cm) Weight: 160 lb 0.9 oz (72.6 kg) IBW/kg (Calculated) : 66.2 Heparin Dosing Weight: 72 kg  Vital Signs: Temp: 97.6 F (36.4 C) (09/23 0355) Temp Source: Oral (09/23 0355) BP: 136/114 (09/23 0355) Pulse Rate: 75 (09/23 0355)  Labs: Recent Labs    07/01/18 0317 07/02/18 0400 07/02/18 1244 07/02/18 2045 07/03/18 0345  HGB 7.0* 7.4*  --   --  8.5*  HCT 21.5* 22.6*  --   --  26.7*  PLT 187 220  --   --  253  HEPARINUNFRC 0.35 0.17* 0.29* 0.50 0.60  CREATININE 1.71* 1.72*  --   --  1.85*    Estimated Creatinine Clearance: 42.7 mL/min (A) (by C-G formula based on SCr of 1.85 mg/dL (H)).   Medications:  Scheduled:  . acetaminophen  650 mg Oral Q6H  . atorvastatin  80 mg Oral q1800  . busPIRone  15 mg Oral TID  . cloNIDine  0.1 mg Oral BID  . clopidogrel  75 mg Oral Daily  . diclofenac sodium  2 g Topical QID  . famotidine  20 mg Oral Daily  . gabapentin  800 mg Oral BID  . insulin aspart  0-20 Units Subcutaneous TID WC  . insulin aspart  0-5 Units Subcutaneous QHS  . insulin glargine  30 Units Subcutaneous QHS  . mirtazapine  15 mg Oral QHS  . mometasone-formoterol  2 puff Inhalation BID  . nicotine  21 mg Transdermal Daily  . pantoprazole  40 mg Oral Daily  . potassium chloride  40 mEq Oral Once  . sodium chloride flush  3 mL Intravenous Q12H   Infusions:  .  ceFAZolin (ANCEF) IV 1 g (07/03/18 0612)  . heparin 1,700 Units/hr (07/02/18 2326)    Assessment: 37 yo F on heparin for LE vasc disease s/p balloon angioplasty (9/17). Plans noted for toe amputation later.   -Hg= 8.5, plt= 253  -heparin level at goal on 1700 units/hr Goal of Therapy:  Heparin level 0.3-0.7 units/ml Monitor platelets by anticoagulation protocol: Yes   Plan:  -No heparin changes needed -Daily heparin level and CBC  Hildred Laser, PharmD Clinical Pharmacist Please check Amion for pharmacy contact number

## 2018-07-03 NOTE — Progress Notes (Signed)
Orthopedic Tech Progress Note Patient Details:  Kathy Phelps 10-15-1980 [id number]  Patient ID: Kathy Phelps, female   DOB: 05-09-1981, 37 y.o.   MRN: [id number]   Hildred Priest 07/03/2018, 10:51 AM Called in bio-tech brace order; spoke with Bella Kennedy

## 2018-07-03 NOTE — Progress Notes (Addendum)
  Progress Note    07/03/2018 7:44 AM 6 Days Post-Op  Subjective:  Clinically, patient seems to be more comfortable.  When asked, patient says pain level is unchanged.   Vitals:   07/02/18 2100 07/03/18 0355  BP: (!) 137/96 (!) 136/114  Pulse: 75 75  Resp:    Temp: 98 F (36.7 C) 97.6 F (36.4 C)  SpO2: 100% 96%   Physical Exam: Lungs:  Non labored Extremities:  Gangrenous R 4th toe; dusky 5th toe, blistering dorsal base of 4,5 toes Abdomen:  Soft Neurologic: A&O  CBC    Component Value Date/Time   WBC 9.6 07/03/2018 0345   RBC 2.83 (L) 07/03/2018 0345   HGB 8.5 (L) 07/03/2018 0345   HCT 26.7 (L) 07/03/2018 0345   PLT 253 07/03/2018 0345   MCV 94.3 07/03/2018 0345   MCH 30.0 07/03/2018 0345   MCHC 31.8 07/03/2018 0345   RDW 12.9 07/03/2018 0345   LYMPHSABS 3.3 06/25/2018 0129   MONOABS 0.9 06/25/2018 0129   EOSABS 0.6 06/25/2018 0129   BASOSABS 0.1 06/25/2018 0129    BMET    Component Value Date/Time   NA 138 07/03/2018 0345   K 3.4 (L) 07/03/2018 0345   CL 102 07/03/2018 0345   CO2 26 07/03/2018 0345   GLUCOSE 160 (H) 07/03/2018 0345   BUN 10 07/03/2018 0345   CREATININE 1.85 (H) 07/03/2018 0345   CALCIUM 8.2 (L) 07/03/2018 0345   GFRNONAA 33 (L) 07/03/2018 0345   GFRAA 39 (L) 07/03/2018 0345    INR    Component Value Date/Time   INR 1.16 06/25/2018 0557     Intake/Output Summary (Last 24 hours) at 07/03/2018 0744 Last data filed at 07/03/2018 0356 Gross per 24 hour  Intake 1743.36 ml  Output 1575 ml  Net 168.36 ml     Assessment/Plan:  37 y.o. female is s/p RLE arteriogram with popliteal atherectomy  6 Days Post-Op   Continue plavix/aspirin Palpable R DP despite tissue changes Likely will require at least 4,5 toe amp however would like to allow time for further demarcation Dr. Donzetta Matters will evaluate the patient later today  Dagoberto Ligas, PA-C [facility] [phone] 07/03/2018 7:44 AM   She left ama prior to my  evaluation today. Will need toe amputation or tma in near future.    Brandon C. Donzetta Matters, MD [facility] [place] Office: [phone] Pager: [phone]

## 2018-07-03 NOTE — Progress Notes (Addendum)
Subjective:   Patient seen at bedside this AM. She continues to have some urinary retention, but states she has been able to push her urine out without in and out cath. Pain has remained stable, she continues to rate it 8/10, but does not appear uncomfortable. She continues to have vaginal bleeding, but this remains stable. She was being seen by orthopedic surgery when we first walked in the room and she is to have her sutures removed from her L BKA today. She also complains of ongoing and potentially worsening difficulty swallowing; she was told we will have her swallowing evaluated.  No further questions.  Objective:  Vital signs in last 24 hours: Vitals:   07/02/18 0426 07/02/18 1424 07/02/18 2100 07/03/18 0355  BP: (!) 151/91 119/79 (!) 137/96 (!) 136/114  Pulse: 77 75 75 75  Resp: 15 16    Temp: 97.8 F (36.6 C) 98 F (36.7 C) 98 F (36.7 C) 97.6 F (36.4 C)  TempSrc: Oral Oral Oral Oral  SpO2: 100% 100% 100% 96%  Weight:      Height:       Physical Exam  Constitutional: She is oriented to person, place, and time. She appears well-developed and well-nourished. No distress.  HENT:  Head: Normocephalic and atraumatic.  Cardiovascular: Normal rate, regular rhythm and normal heart sounds.  Pulses:      Radial pulses are 2+ on the right side, and 2+ on the left side.       Dorsalis pedis pulses are 1+ on the right side.  Pulmonary/Chest: Effort normal and breath sounds normal. No respiratory distress.  Abdominal: Soft. Bowel sounds are normal. She exhibits no distension. There is tenderness (L flank).  Musculoskeletal:  S/P L BKA, clean stitches in place RLE w/ erythema at dorsum w/ faint streaking erythema R 4th toe necrotic with purple discoloration of all digits distally; worse at 3rd and 5th.  Neurological: She is alert and oriented to person, place, and time.  Decreased sensation of RLE w/ absent sensation of R foot.  Skin: Skin is warm and dry.    Assessment/Plan:  Principal Problem:   Lower limb ischemia Active Problems:   Type II diabetes mellitus with renal manifestations, uncontrolled (HCC)   Tobacco abuse   Essential hypertension   AKI (acute kidney injury) (HCC)   Cellulitis   Lymphangitis   Menorrhagia  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  RLE ischemia s/p R popliteal atherectomy & baloon angioplasty on 9/17. Her R DP pulse remains (1+) w/ necrosis of 4th toe and distal portions of all toes. Erythema remains on dorsal surface of RLE. RLE remains managed w/ current pain regimen. -AppreciateVasc Sg recommendations - Continue to monitor R toes w/ possible amputation - Plavix - Cefazolin 1g q8 (Abx Day 8) - Heparin to 1500 unit/hr - Discontinue Dilaudid - Oxycodone IR 10mg  q4h PRN - Acetaminophen 650mg  q6h  Urinary Retention / Difficulty Urinating: Unclear etiology, possibly worsened by foley during procedure and opioid pain medications. Patient reports some symptoms PTA, admit UA not suspicious for UTI. > Repeat U/A, Similar to admission U/A with reduced protein now and Hgb (in setting of on going vaginal bleeding) - Check post void residuals, I&O cath for PVR >300 - Dilaudid discontinues  Acute Menorrhagiaof unknown etiology. HgB up today at 8.4. Remains on IV heparin for limb ischemia. Plans is for her to f/u with otpt GYN. - Monitor CBC - Transfuse < 7  A/CKDIII:Cr stable: ~1.7 (?baseline ~1.5); may be at new baseline - Monitor AM BMP  Lbackpain, Suspect 2/2 MSK pain.The voltaren gel along w/ the pain medications has been managing her pain. Will consider muscle relaxant if current regimen becomes ineffective. - Continue Voltaren gel - Continue  Oxycodone and APAP as above  GERD: Epigastric pain improved. Initial CT abd revealed no pancreatitis and follow up lipase negative. States she has had difficulty eating, but a significant amount of outside food has been seen in the room with report of her eating. - Continue PPI and Famotidine  Uncontrolled T2DM, insulin dependent, w/ neuropathy.BS remains in the mid 100s while on Lantus 30 units. - Lantus 35U qhs - ContinueresistantSSI - Check daily blood sugars - Continue Neurontin 800mg  cap BID  Essential HTN. Pt's blood pressure has been hovering from the 938-182X systolic. Due to her Hx of kidney disease, consider starting an ACE-Iin the future. - Continue Clonidine  Hx of Dyslipidemia. - Continue 80mg  Atorvastatin  Dispo: Anticipated discharge in approximately 3-5 days. Patient will likely need SNF at discharge and will need assistance with where this would leave her daughter as they had been living in a hotel.  [name], MD 07/03/2018, 8:59 AM Pager: [phone]

## 2018-07-03 NOTE — Progress Notes (Signed)
Patient wanting to sign out AMA due to social issues at home with children. States that at risk for loosing children due to them not being in school.  Oldest daughter has been staying in hospital with patient and other two children with significant other and have not been  going to school. [facility] residents at both numbers on the chart.

## 2018-07-03 NOTE — Discharge Summary (Signed)
Patient Left Kathy Phelps  Name: Kathy Phelps MRN: [id number] DOB: 1981-07-19 37 y.o. PCP: Patient, No Pcp Per  Date of Admission: 06/25/2018  1:11 AM Date of Discharge: 07/03/2018 Attending Physician: Bartholomew Crews, M.D Discharge Diagnosis:  1. Ischemic Right Limb with overlaying Cellulitis / Lymphangitis 2.Menorrhagiaof unknown etiology: 3. Urinary Retention:  4. Acute on CKDIII  Discharge Medications: Allergies as of 07/03/2018      Reactions   Hydrocodone-acetaminophen Hives   Tylenol [acetaminophen] Itching, Swelling   Patient tolerated APAP 650 mg (06/29/18) as well as oxycodone/APAP 5-325 mg during 06/25/2018 admission.      Medication List    STOP taking these medications   naproxen 500 MG tablet Commonly known as:  NAPROSYN   oxyCODONE-acetaminophen 10-325 MG tablet Commonly known as:  PERCOCET   simvastatin 40 MG tablet Commonly known as:  ZOCOR     TAKE these medications   ADVAIR DISKUS 100-50 MCG/DOSE Aepb Generic drug:  Fluticasone-Salmeterol Inhale 1 puff into the lungs daily.   alprazolam 2 MG tablet Commonly known as:  XANAX Take 1 tablet (2 mg total) by mouth 4 (four) times daily as needed for sleep. What changed:  when to take this   aspirin EC 81 MG tablet Take 1 tablet (81 mg total) by mouth daily.   atorvastatin 80 MG tablet Commonly known as:  LIPITOR Take 1 tablet (80 mg total) by mouth daily at 6 PM.   busPIRone 15 MG tablet Commonly known as:  BUSPAR Take 15 mg by mouth 3 (three) times daily.   cephALEXin 250 MG capsule Commonly known as:  KEFLEX Take 1 capsule (250 mg total) by mouth 4 (four) times daily for 5 days.   cloNIDine 0.1 MG tablet Commonly known as:  CATAPRES Take 0.1 mg by mouth 2 (two) times daily.   clopidogrel 75 MG tablet Commonly known as:  PLAVIX Take 1 tablet (75 mg total) by mouth daily with breakfast.   DULoxetine 30 MG capsule Commonly known as:  CYMBALTA Take 90 mg by mouth every morning.     famotidine 20 MG tablet Commonly known as:  PEPCID Take 1 tablet (20 mg total) by mouth 2 (two) times daily. What changed:  when to take this   gabapentin 800 MG tablet Commonly known as:  NEURONTIN Take 1 tablet (800 mg total) by mouth 2 (two) times daily. What changed:  when to take this   insulin glargine 100 UNIT/ML injection Commonly known as:  LANTUS Inject 0.2-0.3 mLs (20-30 Units total) into the skin 2 (two) times daily. 20 U in the morning and 30 U in the evening What changed:    how much to take  additional instructions   INSULIN SYRINGE .5CC/31GX5/16" 31G X 5/16" 0.5 ML Misc 1 each by Does not apply route QID.   LATUDA 120 MG Tabs Generic drug:  Lurasidone HCl Take 120 mg by mouth at bedtime.   metFORMIN 1000 MG tablet Commonly known as:  GLUCOPHAGE Take 1 tablet (1,000 mg total) by mouth 2 (two) times daily. What changed:    how much to take  when to take this   metoCLOPramide 5 MG tablet Commonly known as:  REGLAN Take 1 tablet (5 mg total) by mouth 3 (three) times daily before meals.   metoprolol tartrate 25 MG tablet Commonly known as:  LOPRESSOR Take 0.5 tablets (12.5 mg total) by mouth 2 (two) times daily.   mirtazapine 15 MG tablet Commonly known as:  REMERON Take 15 mg by  mouth at bedtime.   nicotine 21 mg/24hr patch Commonly known as:  NICODERM CQ - dosed in mg/24 hours Place 1 patch (21 mg total) onto the skin daily.   nitroGLYCERIN 0.2 mg/hr patch Commonly known as:  NITRODUR - Dosed in mg/24 hr Place 1 patch (0.2 mg total) onto the skin daily.   NOVOLOG 100 UNIT/ML injection Generic drug:  insulin aspart Inject 0-30 Units into the skin 3 (three) times daily with meals. Sliding Scale:  >300 15 units < 300 do not use   Oxycodone HCl 10 MG Tabs Take 1 tablet (10 mg total) by mouth every 4 (four) hours as needed for up to 3 days for severe pain.   pantoprazole 40 MG tablet Commonly known as:  PROTONIX Take 1 tablet (40 mg total) by  mouth daily.   pentoxifylline 400 MG CR tablet Commonly known as:  TRENTAL Take 1 tablet (400 mg total) by mouth 3 (three) times daily with meals.   PROAIR HFA 108 (90 Base) MCG/ACT inhaler Generic drug:  albuterol Inhale 2 puffs into the lungs every 6 (six) hours as needed for shortness of breath.   silver sulfADIAZINE 1 % cream Commonly known as:  SILVADENE Apply 1 application topically daily.   zolpidem 10 MG tablet Commonly known as:  AMBIEN Take 10 mg by mouth at bedtime as needed for sleep.       Disposition and follow-up:   [name] was Left AMA from [facility].  At the hospital follow up visit please address:  Ischemic Right Limb with overlaying Cellulitis / Lymphangitis - Follow up with vascular surgery - Ensure patient able to pick up ASA, Statin, Plavix, Keflex - Hopefully patient will return for hospital for ongoing care  Urinary Retention:  - Ask about continued retention - Check BMP  Menorrhagiaof unknown etiology: - Ensure OB/GYN follow up - CBC  A/CKDIII: - BMP  2.  Labs / imaging needed at time of follow-up: CBC, BMP  3.  Pending labs/ test needing follow-up: None  Follow-up Appointments: Follow-up Information    [facility]. Go on 08/02/2018.   Why:  follow up appointment- at 2:30pm,  please arrive 15 early and  bring ID and list of medications-  Contact information: [place] [phone] [place] [facility] Course by problem list:  Ischemic Right Limb with overlaying Cellulitis / Lymphangitis: Patient with history of PAD, s/p L BKA, Tobacco use Presented with R purple discoloration of her RLE digits for 2 weeks, with 2 days of burning pain and erythema, and fevers/Chills. She reported previously being on plavix, but stopped 4 months ago per her doctors recommendation. She was seen by vascular surgery in the ED and admitted to  medicine for limb ischemia and lymphangitis/cellulitis. She was started on broad spectrum antibiotics, Plavix, and a heparin gtt. She underwent angiogram, which showed 80% stenosis of popliteal artery. Atherectomy and balloon angioplasty were performed resulting in reconstitution of this artery. Patient's pulse improved but remained weak. Her antibiotics were narrowed to ceftazidime. She was monitored in the hospital to give her ischemic limb time to demarcate in order to determine the extent to which her RLE would require amputation.  Unfortunately, before surgery could occur patient received a call from 2 of her children's school informing her that they had failed to attend and that she was responsible for making sure they attend school. Due to  this an concern for that it could lead to her loosing custody, she left the hospital AMA to take care of her children and try to find someone who could help her.  Upon leaving she was witting prescriptions for ASA, Plavix, Statin, % days of keflex, and 3 days of PO pain medication.  Urinary Retention: Patient reported some difficulty urinating PTA. During admission post void residual showed some retention. This was thought to be due in part to her opioid pain medications, which was being weaned at the time patient left AMA. U/A did not elucidate the etiology of her retention either. Work up was ongoing at the time patient left AMA.  Menorrhagiaof unknown etiology: On presentation, patient reported 2 days of dysuria & intermittent L flank pain radiating to her groin as well as menorrhagia w/ dark red penny-sized clots requiring 5 tampons & 5 pads. Her CT in the ED showed normal uterus and adnexa. Her Hgb was monitored as she was on Heparin gtt for limb ischemia. Her Hgb remain stable despite continue bleeding. On the day patient left AMA Hgb was 8.4. Patient had been instructed to follow up with OB/GYN as outpatient.  A/CKDIII:Cr on admission was 2.2 (up from her  base line of 1.5); this slowly improved with hydration during her stay and was stable at 1.7 on day she left AMA.  Lbackpain, Suspect 2/2 MSK pain: Patient presented with left flank pain, no stone or other finding on CT to explain this. Some relief with Voltaren gel and other pain medications. Etiology of this pain was unclear, though MSK pain was suspected.  Discharge Vitals:   BP (!) 136/114 (BP Location: Right Arm)   Pulse 75   Temp 97.6 F (36.4 C) (Oral)   Resp 16   Ht 5\' 9"  (1.753 m)   Wt 72.6 kg   SpO2 96%   BMI 23.64 kg/m   Pertinent Labs, Studies, and Procedures:  CBC Latest Ref Rng & Units 07/03/2018 07/02/2018 07/01/2018  WBC 4.0 - 10.5 K/uL 9.6 7.8 8.5  Hemoglobin 12.0 - 15.0 g/dL 8.5(L) 7.4(L) 7.0(L)  Hematocrit 36.0 - 46.0 % 26.7(L) 22.6(L) 21.5(L)  Platelets 150 - 400 K/uL 253 220 187   CMP Latest Ref Rng & Units 07/03/2018 07/02/2018 07/01/2018  Glucose 70 - 99 mg/dL 160(H) 185(H) 155(H)  BUN 6 - 20 mg/dL 10 9 11   Creatinine 0.44 - 1.00 mg/dL 1.85(H) 1.72(H) 1.71(H)  Sodium 135 - 145 mmol/L 138 139 136  Potassium 3.5 - 5.1 mmol/L 3.4(L) 3.6 3.5  Chloride 98 - 111 mmol/L 102 102 102  CO2 22 - 32 mmol/L 26 28 25   Calcium 8.9 - 10.3 mg/dL 8.2(L) 8.1(L) 8.2(L)  Total Protein 6.5 - 8.1 g/dL - - -  Total Bilirubin 0.3 - 1.2 mg/dL - - -  Alkaline Phos 38 - 126 U/L - - -  AST 15 - 41 U/L - - -  ALT 0 - 44 U/L - - -   CT Renal stone: IMPRESSION: 1. No renal stones or obstructive uropathy. 2. No acute findings in the abdomen or pelvis. 3.  Aortic Atherosclerosis (ICD10-I70.0).  Peripheral Vascular Cath: Impression: - High-grade lesion in the right above-knee popliteal artery as well as diffuse luminal irregularity from the adductor canal down to the joint space.  This was treated with jetstream atherectomy and drug-coated balloon angioplasty using a Impact Admiral 5 x 150 - Total of 20cc of contrast was utilized  Discharge Instructions:   Signed: [name], MD 07/03/2018,  12:15 PM   Pager: [phone]

## 2018-07-21 ENCOUNTER — Ambulatory Visit (INDEPENDENT_AMBULATORY_CARE_PROVIDER_SITE_OTHER): Payer: Medicaid Other | Admitting: Physician Assistant

## 2018-07-21 ENCOUNTER — Encounter (INDEPENDENT_AMBULATORY_CARE_PROVIDER_SITE_OTHER): Payer: Self-pay | Admitting: Physician Assistant

## 2018-07-21 VITALS — Ht 69.0 in | Wt 160.0 lb

## 2018-07-21 DIAGNOSIS — E441 Mild protein-calorie malnutrition: Secondary | ICD-10-CM

## 2018-07-21 DIAGNOSIS — I96 Gangrene, not elsewhere classified: Secondary | ICD-10-CM

## 2018-07-21 DIAGNOSIS — Z794 Long term (current) use of insulin: Secondary | ICD-10-CM

## 2018-07-21 DIAGNOSIS — Z89512 Acquired absence of left leg below knee: Secondary | ICD-10-CM

## 2018-07-21 DIAGNOSIS — I739 Peripheral vascular disease, unspecified: Secondary | ICD-10-CM

## 2018-07-21 DIAGNOSIS — E1142 Type 2 diabetes mellitus with diabetic polyneuropathy: Secondary | ICD-10-CM

## 2018-07-21 MED ORDER — OXYCODONE-ACETAMINOPHEN 5-325 MG PO TABS: 1.0000 | ORAL_TABLET | ORAL | 0 refills | Status: DC | PRN

## 2018-07-21 NOTE — Progress Notes (Signed)
Office Visit Note   Patient: Kathy Phelps           Date of Birth: 04/25/1981           MRN: [id number] Visit Date: 07/21/2018              Requested by: No referring provider defined for this encounter. PCP: Patient, No Pcp Per  No chief complaint on file.     HPI: Patient is a 37 year old female who is seen for postoperative/ follow-up following revision of her left transtibial amputation 05/24/2018. She was seen for follow up 06/01/2018 but then was hospitalized 06/25/2018 through 07/03/2018 for acute right lower extremity ischemia with cellulitis and lymphangitis.  During this hospitalization she underwent revascularization of her right popliteal artery with artherectomy and drug coated balloon angioplasty per vascular surgery, Dr. Annamarie Major.  She does have gangrene of several toes on her right foot and is being followed by vascular surgery for demarcation following her revascularization procedure. Her gangrene of the right great toe, and the 4th and 5th toes.  She presents for suture removal from the left transtibial amputation. She has continued to smoke cigarettes and reports she has not been successful using nicotine patches and she cannot afford Chantix.       Assessment & Plan: Visit Diagnoses:  1. Acquired absence of left leg below knee (HCC)   2. Gangrene of toe of right foot ([place])   3. PAD (peripheral artery disease) ([place])   4. Type 2 diabetes mellitus with diabetic polyneuropathy, with long-term current use of insulin ([place])   5. Mild protein-calorie malnutrition ([place])     Plan: Sutures were harvested from the left transtibial amputation incision. She will use gauze and Ace wrap to the left BKA for compression. She will follow up next week for further discussion of plan for right foot and counseled she may require a right transtibial amputation as well. Will plan for OR next Wednesday, 07/26/2018 for right foot dry gangrene.   Follow-Up Instructions: Return in  about 3 days (around 07/24/2018).   Ortho Exam  Patient is alert, oriented, no adenopathy, well-dressed, normal affect, normal respiratory effort. Patient presents in the wheelchair.  Left transtibial amputation site sutures were removed. No signs of cellulitis, minimal edema. Full knee extension and good flexion.   Right foot black necrotic toes 4th and 5th and great toe>>> than tips of 3rd and 2nd toes. Scant drainage and odor noted. Dorsalis pedis difficult to obtain even with Doppler, but able to Doppler pulse in the 1st web space and monophasic pulse noted.The posterior tibialis pulse is biphasic to Doppler.   Imaging: No results found. No images are attached to the encounter.  Labs: Lab Results  Component Value Date   HGBA1C 8.0 (H) 05/24/2018   HGBA1C 9.1 (H) 02/01/2018   HGBA1C 11.8 (H) 02/02/2017   ESRSEDRATE 63 (H) 12/13/2017   ESRSEDRATE 72 (H) 09/02/2017   ESRSEDRATE 34 (H) 02/02/2017   CRP 3.5 (H) 09/02/2017   REPTSTATUS 06/30/2018 FINAL 06/25/2018   GRAMSTAIN  04/25/2018    FEW WBC PRESENT,BOTH PMN AND MONONUCLEAR FEW GRAM POSITIVE COCCI RARE GRAM POSITIVE RODS Performed at [facility], [place] [address] [place], [place] [address]    CULT  06/25/2018    NO GROWTH 5 DAYS Performed at [facility] [address], [place], [place] [address]    [place] 04/25/2018   LABORGA STAPHYLOCOCCUS AUREUS 04/25/2018     Lab  Results  Component Value Date   ALBUMIN 3.0 (L) 06/25/2018   ALBUMIN 2.1 (L) 09/07/2017   ALBUMIN 2.2 (L) 09/06/2017   PREALBUMIN 15.3 (L) 09/02/2017    There is no height or weight on file to calculate BMI.  Orders:  No orders of the defined types were placed in this encounter.  Meds ordered this encounter  Medications  . oxyCODONE-acetaminophen (PERCOCET/ROXICET) 5-325 MG tablet    Sig: Take 1-2 tablets by mouth every 4 (four) hours as needed for severe pain.    Dispense:  40 tablet    Refill:  0      Procedures: No procedures performed  Clinical Data: No additional findings.  ROS:  All other systems negative, except as noted in the HPI. Review of Systems  Objective: Vital Signs: There were no vitals taken for this visit.  Specialty Comments:  No specialty comments available.  PMFS History: Patient Active Problem List   Diagnosis Date Noted  . Lower limb ischemia 06/25/2018  . Lymphangitis 06/25/2018  . Menorrhagia 06/25/2018  . Hx of BKA, left ([place]) 05/24/2018  . History of left below knee amputation ([place]) 02/01/2018  . Gangrene of left foot ([place])   . Dehiscence of amputation stump ([place]) 01/26/2018  . Surgical wound, non healing 09/02/2017  . Bipolar 1 disorder ([place]) 09/02/2017  . Nausea vomiting and diarrhea 08/11/2017  . Great toe pain, left 08/11/2017  . GERD (gastroesophageal reflux disease) 08/10/2017  . Anxiety 08/10/2017  . Edema   . Cellulitis   . AKI (acute kidney injury) ([place]) 02/04/2017  . Type II diabetes mellitus with renal manifestations, uncontrolled ([place]) 02/02/2017  . HLD (hyperlipidemia) 02/02/2017  . Tobacco abuse 02/02/2017  . Essential hypertension 02/02/2017  . Malnutrition of moderate degree 02/02/2017   Past Medical History:  Diagnosis Date  . Anxiety   . Asthma   . Bipolar affective ([place])   . Complete miscarriage   . Constipation   . 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 Depression   . Diabetes mellitus    Type II  . Dyspnea     " only at night and I stop breathing in my sleep too for about the last three weeks"  . GERD (gastroesophageal reflux disease)   . Headache    "migraraines"  . Hyperlipidemia   . Hypertension   . Osteomyelitis ([place])    left foot  . Pregnancy complication    HELP Syndrome  . Renal disorder   . Schizophrenia ([place])   . Stroke [facility])    " mild stroke", memory loss- approx 2017    Family History  Problem Relation Age of Onset  . Diabetes Mellitus II Mother   . Heart disease  Mother   . Heart disease Father     Past Surgical History:  Procedure Laterality Date  . ABDOMINAL AORTOGRAM N/A 09/07/2017   Procedure: ABDOMINAL AORTOGRAM;  Surgeon: Waynetta Sandy, MD;  Location: [facility];  Service: Cardiovascular;  Laterality: N/A;  . ABDOMINAL AORTOGRAM W/LOWER EXTREMITY N/A 02/03/2017   Procedure: Abdominal Aortogram w/Lower Extremity;  Surgeon: Waynetta Sandy, MD;  Location: [facility];  Service: Cardiovascular;  Laterality: N/A;  . ABDOMINAL AORTOGRAM W/LOWER EXTREMITY N/A 06/27/2018   Procedure: ABDOMINAL AORTOGRAM W/LOWER EXTREMITY;  Surgeon: Serafina Mitchell, MD;  Location: [facility];  Service: Cardiovascular;  Laterality: N/A;  . AMPUTATION Left 02/10/2017   Procedure: AMPUTATION TOES 3, 4 AND 5  LEFT  FOOT;  Surgeon: Waynetta Sandy, MD;  Location: [facility];  Service: Vascular;  Laterality: Left;  . AMPUTATION Left 12/21/2017   Procedure: LEFT FOOT 5TH RAY AMPUTATION;  Surgeon: Newt Minion, MD;  Location: [facility];  Service: Orthopedics;  Laterality: Left;  . AMPUTATION Left 02/01/2018   Procedure: LEFT BELOW KNEE AMPUTATION;  Surgeon: Newt Minion, MD;  Location: [facility];  Service: Orthopedics;  Laterality: Left;  . CESAREAN SECTION    . CHOLECYSTECTOMY    . LOWER EXTREMITY ANGIOGRAPHY Bilateral 09/07/2017   Procedure: Lower Extremity Angiography;  Surgeon: Waynetta Sandy, MD;  Location: [facility];  Service: Cardiovascular;  Laterality: Bilateral;  . PERIPHERAL VASCULAR ATHERECTOMY  02/03/2017   Procedure: Peripheral Vascular Atherectomy;  Surgeon: Waynetta Sandy, MD;  Location: [facility];  Service: Cardiovascular;;  . PERIPHERAL VASCULAR ATHERECTOMY Right 06/27/2018   Procedure: PERIPHERAL VASCULAR ATHERECTOMY;  Surgeon: Serafina Mitchell, MD;  Location: [facility];  Service: Cardiovascular;  Laterality: Right;  superficial femoral  . PERIPHERAL VASCULAR BALLOON  ANGIOPLASTY  02/03/2017   Procedure: Peripheral Vascular Balloon Angioplasty;  Surgeon: Waynetta Sandy, MD;  Location: [facility];  Service: Cardiovascular;;  . PERIPHERAL VASCULAR INTERVENTION Left 09/07/2017   Procedure: PERIPHERAL VASCULAR INTERVENTION;  Surgeon: Waynetta Sandy, MD;  Location: [facility];  Service: Cardiovascular;  Laterality: Left;  SFA/POPLITEAL  . STUMP REVISION Left 05/24/2018  . STUMP REVISION Left 05/24/2018   Procedure: REVISION LEFT BELOW KNEE AMPUTATION;  Surgeon: Newt Minion, MD;  Location: [facility];  Service: Orthopedics;  Laterality: Left;  . TUBAL LIGATION     Social History   Occupational History  . Not on file  Tobacco Use  . Smoking status: Current Every Day Smoker    Packs/day: 1.00    Years: 28.00    Pack years: 28.00    Types: Cigarettes  . Smokeless tobacco: Never Used  Substance and Sexual Activity  . Alcohol use: No  . Drug use: No  . Sexual activity: Yes    Birth control/protection: None

## 2018-07-24 ENCOUNTER — Ambulatory Visit (INDEPENDENT_AMBULATORY_CARE_PROVIDER_SITE_OTHER): Payer: Medicaid Other | Admitting: Physician Assistant

## 2018-07-25 ENCOUNTER — Ambulatory Visit (INDEPENDENT_AMBULATORY_CARE_PROVIDER_SITE_OTHER): Payer: Self-pay | Admitting: Physician Assistant

## 2018-07-25 NOTE — Progress Notes (Signed)
Tried numerous times to get in touch with pt for pre-op call. The number listed as home # is continuously busy. The number that was found under "best" number, it would ring then a recording would come on stating "Cannot complete this call at this time, please try again later". I did speak to pt's emergency contact and she said she would try to get hold of pt and give her my number. Pt never called. I called the emergency contact and she said she texted pt my number to call. I told her that I had not heard from her and asked her to text pt that we needed her to arrive at 7:40 AM and to decrease her insulin by tonight and in the AM. She states she will do her best to get this to the patient.

## 2018-07-26 ENCOUNTER — Encounter (HOSPITAL_COMMUNITY): Payer: Self-pay | Admitting: Certified Registered"

## 2018-07-26 ENCOUNTER — Inpatient Hospital Stay (HOSPITAL_COMMUNITY): Admission: RE | Admit: 2018-07-26 | Payer: Medicaid Other | Source: Ambulatory Visit | Admitting: Orthopedic Surgery

## 2018-07-26 SURGERY — AMPUTATION BELOW KNEE
Anesthesia: General | Laterality: Right

## 2018-07-26 NOTE — H&P (Addendum)
Kathy Phelps is an 37 y.o. female.   Chief Complaint: Painful gangrene right foot HPI: Patient is a 37 year old woman with peripheral vascular disease type 2 diabetes and smoking.  Patient has recently undergone a left transtibial amputation.  Patient is also undergone revascularization to the right lower extremity with angioplasty with Dr. Annamarie Major.  Patient presents at this time with progressive gangrenous changes to the right toes including extensive gangrene of the fourth and fifth toes with ischemic changes to the great toe second toe and third toe.  Patient states that she is continued to smoke and has been unsuccessful with any attempts of smoking cessation.  Past Medical History:  Diagnosis Date  . Anxiety   . Asthma   . Bipolar affective ([place])   . Complete miscarriage   . Constipation   . 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 Depression   . Diabetes mellitus    Type II  . Dyspnea     " only at night and I stop breathing in my sleep too for about the last three weeks"  . GERD (gastroesophageal reflux disease)   . Headache    "migraraines"  . Hyperlipidemia   . Hypertension   . Osteomyelitis ([place])    left foot  . Pregnancy complication    HELP Syndrome  . Renal disorder   . Schizophrenia ([place])   . Stroke [facility])    " mild stroke", memory loss- approx 2017    Past Surgical History:  Procedure Laterality Date  . ABDOMINAL AORTOGRAM N/A 09/07/2017   Procedure: ABDOMINAL AORTOGRAM;  Surgeon: Waynetta Sandy, MD;  Location: [facility];  Service: Cardiovascular;  Laterality: N/A;  . ABDOMINAL AORTOGRAM W/LOWER EXTREMITY N/A 02/03/2017   Procedure: Abdominal Aortogram w/Lower Extremity;  Surgeon: Waynetta Sandy, MD;  Location: [facility];  Service: Cardiovascular;  Laterality: N/A;  . ABDOMINAL AORTOGRAM W/LOWER EXTREMITY N/A 06/27/2018   Procedure: ABDOMINAL AORTOGRAM W/LOWER EXTREMITY;  Surgeon: Serafina Mitchell,  MD;  Location: [facility];  Service: Cardiovascular;  Laterality: N/A;  . AMPUTATION Left 02/10/2017   Procedure: AMPUTATION TOES 3, 4 AND 5  LEFT FOOT;  Surgeon: Waynetta Sandy, MD;  Location: [facility];  Service: Vascular;  Laterality: Left;  . AMPUTATION Left 12/21/2017   Procedure: LEFT FOOT 5TH RAY AMPUTATION;  Surgeon: Newt Minion, MD;  Location: [facility];  Service: Orthopedics;  Laterality: Left;  . AMPUTATION Left 02/01/2018   Procedure: LEFT BELOW KNEE AMPUTATION;  Surgeon: Newt Minion, MD;  Location: [facility];  Service: Orthopedics;  Laterality: Left;  . CESAREAN SECTION    . CHOLECYSTECTOMY    . LOWER EXTREMITY ANGIOGRAPHY Bilateral 09/07/2017   Procedure: Lower Extremity Angiography;  Surgeon: Waynetta Sandy, MD;  Location: [facility];  Service: Cardiovascular;  Laterality: Bilateral;  . PERIPHERAL VASCULAR ATHERECTOMY  02/03/2017   Procedure: Peripheral Vascular Atherectomy;  Surgeon: Waynetta Sandy, MD;  Location: [facility];  Service: Cardiovascular;;  . PERIPHERAL VASCULAR ATHERECTOMY Right 06/27/2018   Procedure: PERIPHERAL VASCULAR ATHERECTOMY;  Surgeon: Serafina Mitchell, MD;  Location: [facility];  Service: Cardiovascular;  Laterality: Right;  superficial femoral  . PERIPHERAL VASCULAR BALLOON ANGIOPLASTY  02/03/2017   Procedure: Peripheral Vascular Balloon Angioplasty;  Surgeon: Waynetta Sandy, MD;  Location: [facility];  Service: Cardiovascular;;  . PERIPHERAL VASCULAR INTERVENTION Left 09/07/2017   Procedure: PERIPHERAL VASCULAR INTERVENTION;  Surgeon: Waynetta Sandy, MD;  Location: [facility];  Service: Cardiovascular;  Laterality: Left;  SFA/POPLITEAL  . STUMP REVISION Left 05/24/2018  . STUMP REVISION Left 05/24/2018   Procedure: REVISION LEFT BELOW KNEE AMPUTATION;  Surgeon: Newt Minion, MD;  Location: [facility];  Service: Orthopedics;  Laterality: Left;  . TUBAL LIGATION       Family History  Problem Relation Age of Onset  . Diabetes Mellitus II Mother   . Heart disease Mother   . Heart disease Father    Social History:  reports that she has been smoking cigarettes. She has a 28.00 pack-year smoking history. She has never used smokeless tobacco. She reports that she does not drink alcohol or use drugs.  Allergies:  Allergies  Allergen Reactions  . Hydrocodone-Acetaminophen Hives  . Tylenol [Acetaminophen] Itching and Swelling    Patient tolerated APAP 650 mg (06/29/18) as well as oxycodone/APAP 5-325 mg during 06/25/2018 admission.    No medications prior to admission.    No results found for this or any previous visit (from the past 48 hour(s)). No results found.  Review of Systems  All other systems reviewed and are negative.   There were no vitals taken for this visit. Physical Exam  Patient is alert, oriented, no adenopathy, well-dressed, normal affect, normal respiratory effort. Patient presents in the wheelchair.  Left transtibial amputation site sutures were removed. No signs of cellulitis, minimal edema. Full knee extension and good flexion.   Right foot black necrotic toes 4th and 5th and great toe>>> than tips of 3rd and 2nd toes. Scant drainage and odor noted. Dorsalis pedis difficult to obtain even with Doppler, but able to Doppler pulse in the 1st web space and monophasic pulse noted.The posterior tibialis pulse is biphasic to Doppler.  Assessment/Plan 1. Acquired absence of left leg below knee (HCC)   2. Gangrene of toe of right foot ([place])   3. PAD (peripheral artery disease) ([place])   4. Type 2 diabetes mellitus with diabetic polyneuropathy, with long-term current use of insulin ([place])   5. Mild protein-calorie malnutrition ([place])    Patient states she would like to proceed with a transtibial amputation on the right.  Risk and benefits were discussed including risk of the wound not healing.  Patient states she understands wishes to  proceed at this time.  Newt Minion, MD 07/26/2018, 7:02 AM

## 2018-07-27 ENCOUNTER — Telehealth (INDEPENDENT_AMBULATORY_CARE_PROVIDER_SITE_OTHER): Payer: Self-pay | Admitting: Orthopedic Surgery

## 2018-07-27 NOTE — Telephone Encounter (Signed)
Kathy Phelps was scheduled for a Right below knee amputation on Wed 07/26/2018. She did not show up for surgery.  I called multiple times prior to her surgery appointment to confirm her surgery time.  The phone number we have for her is not in service and rings busy constantly. Since she no-showed for surgery I have again been trying to get in touch with her to get her rescheduled.  Her emergency contact tells me that she has no way to get in touch with her, [name] lives in [place] and her emergency contact is in [place] and has no other number to reach her on. I called the patient's pharmacy to see if they have an alternate number, the pharmacist says,  no they do not have a good number and often have a hard time getting in touch with the patient.  At this time, I am unable to reach the patient or get a message to her.

## 2018-08-02 ENCOUNTER — Inpatient Hospital Stay (INDEPENDENT_AMBULATORY_CARE_PROVIDER_SITE_OTHER): Payer: Medicaid Other | Admitting: Physician Assistant

## 2018-08-10 ENCOUNTER — Emergency Department (HOSPITAL_COMMUNITY): Payer: Medicaid Other

## 2018-08-10 ENCOUNTER — Other Ambulatory Visit: Payer: Self-pay

## 2018-08-10 ENCOUNTER — Encounter (HOSPITAL_COMMUNITY): Payer: Self-pay | Admitting: Emergency Medicine

## 2018-08-10 ENCOUNTER — Inpatient Hospital Stay (HOSPITAL_COMMUNITY)
Admission: EM | Admit: 2018-08-10 | Discharge: 2018-08-15 | DRG: 240 | Disposition: A | Discharge status: Home Health | Payer: Medicaid Other | Attending: Internal Medicine | Admitting: Internal Medicine

## 2018-08-10 DIAGNOSIS — Z886 Allergy status to analgesic agent status: Secondary | ICD-10-CM

## 2018-08-10 DIAGNOSIS — E11621 Type 2 diabetes mellitus with foot ulcer: Secondary | ICD-10-CM | POA: Diagnosis present

## 2018-08-10 DIAGNOSIS — R112 Nausea with vomiting, unspecified: Secondary | ICD-10-CM

## 2018-08-10 DIAGNOSIS — E1122 Type 2 diabetes mellitus with diabetic chronic kidney disease: Secondary | ICD-10-CM | POA: Diagnosis present

## 2018-08-10 DIAGNOSIS — K3184 Gastroparesis: Secondary | ICD-10-CM | POA: Diagnosis present

## 2018-08-10 DIAGNOSIS — Z8673 Personal history of transient ischemic attack (TIA), and cerebral infarction without residual deficits: Secondary | ICD-10-CM

## 2018-08-10 DIAGNOSIS — E1152 Type 2 diabetes mellitus with diabetic peripheral angiopathy with gangrene: Principal | ICD-10-CM | POA: Diagnosis present

## 2018-08-10 DIAGNOSIS — Z7982 Long term (current) use of aspirin: Secondary | ICD-10-CM

## 2018-08-10 DIAGNOSIS — N179 Acute kidney failure, unspecified: Secondary | ICD-10-CM | POA: Diagnosis present

## 2018-08-10 DIAGNOSIS — N183 Chronic kidney disease, stage 3 unspecified: Secondary | ICD-10-CM | POA: Diagnosis present

## 2018-08-10 DIAGNOSIS — A419 Sepsis, unspecified organism: Secondary | ICD-10-CM

## 2018-08-10 DIAGNOSIS — E1129 Type 2 diabetes mellitus with other diabetic kidney complication: Secondary | ICD-10-CM | POA: Diagnosis present

## 2018-08-10 DIAGNOSIS — Z885 Allergy status to narcotic agent status: Secondary | ICD-10-CM

## 2018-08-10 DIAGNOSIS — Z794 Long term (current) use of insulin: Secondary | ICD-10-CM

## 2018-08-10 DIAGNOSIS — K219 Gastro-esophageal reflux disease without esophagitis: Secondary | ICD-10-CM | POA: Diagnosis present

## 2018-08-10 DIAGNOSIS — I10 Essential (primary) hypertension: Secondary | ICD-10-CM | POA: Diagnosis present

## 2018-08-10 DIAGNOSIS — E11649 Type 2 diabetes mellitus with hypoglycemia without coma: Secondary | ICD-10-CM | POA: Diagnosis not present

## 2018-08-10 DIAGNOSIS — I96 Gangrene, not elsewhere classified: Secondary | ICD-10-CM

## 2018-08-10 DIAGNOSIS — F209 Schizophrenia, unspecified: Secondary | ICD-10-CM | POA: Diagnosis present

## 2018-08-10 DIAGNOSIS — J45909 Unspecified asthma, uncomplicated: Secondary | ICD-10-CM | POA: Diagnosis present

## 2018-08-10 DIAGNOSIS — Z7951 Long term (current) use of inhaled steroids: Secondary | ICD-10-CM

## 2018-08-10 DIAGNOSIS — I129 Hypertensive chronic kidney disease with stage 1 through stage 4 chronic kidney disease, or unspecified chronic kidney disease: Secondary | ICD-10-CM | POA: Diagnosis present

## 2018-08-10 DIAGNOSIS — F319 Bipolar disorder, unspecified: Secondary | ICD-10-CM | POA: Diagnosis present

## 2018-08-10 DIAGNOSIS — Z7902 Long term (current) use of antithrombotics/antiplatelets: Secondary | ICD-10-CM

## 2018-08-10 DIAGNOSIS — E1165 Type 2 diabetes mellitus with hyperglycemia: Secondary | ICD-10-CM | POA: Diagnosis present

## 2018-08-10 DIAGNOSIS — Z89512 Acquired absence of left leg below knee: Secondary | ICD-10-CM

## 2018-08-10 DIAGNOSIS — IMO0002 Reserved for concepts with insufficient information to code with codable children: Secondary | ICD-10-CM | POA: Diagnosis present

## 2018-08-10 DIAGNOSIS — Z8249 Family history of ischemic heart disease and other diseases of the circulatory system: Secondary | ICD-10-CM

## 2018-08-10 DIAGNOSIS — L97519 Non-pressure chronic ulcer of other part of right foot with unspecified severity: Secondary | ICD-10-CM | POA: Diagnosis present

## 2018-08-10 DIAGNOSIS — I998 Other disorder of circulatory system: Secondary | ICD-10-CM | POA: Diagnosis present

## 2018-08-10 DIAGNOSIS — E785 Hyperlipidemia, unspecified: Secondary | ICD-10-CM | POA: Diagnosis present

## 2018-08-10 DIAGNOSIS — Z9119 Patient's noncompliance with other medical treatment and regimen: Secondary | ICD-10-CM

## 2018-08-10 DIAGNOSIS — D631 Anemia in chronic kidney disease: Secondary | ICD-10-CM | POA: Diagnosis present

## 2018-08-10 DIAGNOSIS — Z79899 Other long term (current) drug therapy: Secondary | ICD-10-CM

## 2018-08-10 DIAGNOSIS — E1169 Type 2 diabetes mellitus with other specified complication: Secondary | ICD-10-CM | POA: Diagnosis present

## 2018-08-10 DIAGNOSIS — E1151 Type 2 diabetes mellitus with diabetic peripheral angiopathy without gangrene: Secondary | ICD-10-CM

## 2018-08-10 DIAGNOSIS — M869 Osteomyelitis, unspecified: Secondary | ICD-10-CM | POA: Diagnosis present

## 2018-08-10 DIAGNOSIS — E1143 Type 2 diabetes mellitus with diabetic autonomic (poly)neuropathy: Secondary | ICD-10-CM | POA: Diagnosis present

## 2018-08-10 DIAGNOSIS — Z833 Family history of diabetes mellitus: Secondary | ICD-10-CM

## 2018-08-10 DIAGNOSIS — F17210 Nicotine dependence, cigarettes, uncomplicated: Secondary | ICD-10-CM | POA: Diagnosis present

## 2018-08-10 LAB — COMPREHENSIVE METABOLIC PANEL
ALBUMIN: 2.7 g/dL — AB (ref 3.5–5.0)
ALK PHOS: 85 U/L (ref 38–126)
ALT: 9 U/L (ref 0–44)
AST: 18 U/L (ref 15–41)
Anion gap: 15 (ref 5–15)
BUN: 20 mg/dL (ref 6–20)
CALCIUM: 9.2 mg/dL (ref 8.9–10.3)
CHLORIDE: 102 mmol/L (ref 98–111)
CO2: 19 mmol/L — AB (ref 22–32)
CREATININE: 2.62 mg/dL — AB (ref 0.44–1.00)
GFR calc Af Amer: 25 mL/min — ABNORMAL LOW (ref >60)
GFR calc non Af Amer: 22 mL/min — ABNORMAL LOW (ref >60)
Glucose, Bld: 199 mg/dL — ABNORMAL HIGH (ref 70–99)
Potassium: 3.7 mmol/L (ref 3.5–5.1)
SODIUM: 136 mmol/L (ref 135–145)
Total Bilirubin: 0.4 mg/dL (ref 0.3–1.2)
Total Protein: 7.8 g/dL (ref 6.5–8.1)

## 2018-08-10 LAB — CBC WITH DIFFERENTIAL/PLATELET
ABS IMMATURE GRANULOCYTES: 0.07 10*3/uL (ref 0.00–0.07)
BASOS ABS: 0 10*3/uL (ref 0.0–0.1)
Basophils Relative: 0 %
EOS PCT: 1 %
Eosinophils Absolute: 0.2 10*3/uL (ref 0.0–0.5)
HEMATOCRIT: 27.4 % — AB (ref 36.0–46.0)
HEMOGLOBIN: 8.4 g/dL — AB (ref 12.0–15.0)
Immature Granulocytes: 1 %
LYMPHS PCT: 15 %
Lymphs Abs: 2.3 10*3/uL (ref 0.7–4.0)
MCH: 27.4 pg (ref 26.0–34.0)
MCHC: 30.7 g/dL (ref 30.0–36.0)
MCV: 89.3 fL (ref 80.0–100.0)
Monocytes Absolute: 0.9 10*3/uL (ref 0.1–1.0)
Monocytes Relative: 6 %
NEUTROS ABS: 12 10*3/uL — AB (ref 1.7–7.7)
NRBC: 0 % (ref 0.0–0.2)
Neutrophils Relative %: 77 %
Platelets: 412 10*3/uL — ABNORMAL HIGH (ref 150–400)
RBC: 3.07 MIL/uL — AB (ref 3.87–5.11)
RDW: 12 % (ref 11.5–15.5)
WBC: 15.5 10*3/uL — ABNORMAL HIGH (ref 4.0–10.5)

## 2018-08-10 LAB — I-STAT CG4 LACTIC ACID, ED: Lactic Acid, Venous: 1.47 mmol/L (ref 0.5–1.9)

## 2018-08-10 LAB — LIPASE, BLOOD: Lipase: 27 U/L (ref 11–51)

## 2018-08-10 LAB — PROTIME-INR
INR: 1.08
PROTHROMBIN TIME: 14 s (ref 11.4–15.2)

## 2018-08-10 LAB — I-STAT BETA HCG BLOOD, ED (MC, WL, AP ONLY): I-stat hCG, quantitative: <5 m[IU]/mL (ref <5)

## 2018-08-10 NOTE — ED Notes (Signed)
This NT notified pt that NT had to stick another time for another set of blood cultures, pt refused and stated they will not be stuck again.

## 2018-08-10 NOTE — ED Triage Notes (Signed)
Pt has a BKA to left leg. One month ago pt reports rt foot started changing color. Pt's rt foot now has 2 right toes that are black, rest of toes blue and red. 10/10 pain. Reports vomiting up green stuff that started two hours ago with epigastric pain. Pt uses wheelchair. Pt has hx of diabetes.

## 2018-08-10 NOTE — ED Provider Notes (Signed)
Patient placed in Quick Look pathway, seen and evaluated   Chief Complaint: "black foot"  HPI: Kathy Phelps is a 37 y.o. female with hx of DM who present to the ED with one month hx of worsening infection to the right toes. Patient reports that now the toes are black. She states that Dr. Sharol Given removed her left foot due to infection but she has not been back to see him in a long time and that she does not have a PCP.  ROS: M/S: foot pain right  Skin: infection right foot  Physical Exam:  BP (!) 156/106 (BP Location: Right Arm)   Pulse (!) 115   Temp 99.6 F (37.6 C) (Oral)   Resp 18   SpO2 99%    Gen: No distress  Neuro: Awake and Alert  Skin: toes of right foot dark with some drainage noted  Heart: tachycardia    Initiation of care has begun. The patient has been counseled on the process, plan, and necessity for staying for the completion/evaluation, and the remainder of the medical screening examination    Ashley Murrain, NP 08/10/18 2041    Tegeler, Gwenyth Allegra, MD 08/11/18 [phone]

## 2018-08-11 ENCOUNTER — Inpatient Hospital Stay (HOSPITAL_COMMUNITY): Payer: Medicaid Other

## 2018-08-11 ENCOUNTER — Other Ambulatory Visit: Payer: Self-pay

## 2018-08-11 ENCOUNTER — Encounter (HOSPITAL_COMMUNITY): Payer: Self-pay

## 2018-08-11 ENCOUNTER — Inpatient Hospital Stay (HOSPITAL_COMMUNITY): Payer: Medicaid Other | Admitting: Anesthesiology

## 2018-08-11 ENCOUNTER — Encounter (HOSPITAL_COMMUNITY)
Admission: EM | Disposition: A | Discharge status: Home Health | Payer: Self-pay | Source: Home / Self Care | Attending: Internal Medicine

## 2018-08-11 DIAGNOSIS — E1151 Type 2 diabetes mellitus with diabetic peripheral angiopathy without gangrene: Secondary | ICD-10-CM

## 2018-08-11 DIAGNOSIS — K3184 Gastroparesis: Secondary | ICD-10-CM | POA: Diagnosis present

## 2018-08-11 DIAGNOSIS — I96 Gangrene, not elsewhere classified: Secondary | ICD-10-CM

## 2018-08-11 DIAGNOSIS — I998 Other disorder of circulatory system: Secondary | ICD-10-CM | POA: Diagnosis present

## 2018-08-11 DIAGNOSIS — F209 Schizophrenia, unspecified: Secondary | ICD-10-CM | POA: Diagnosis present

## 2018-08-11 DIAGNOSIS — N179 Acute kidney failure, unspecified: Secondary | ICD-10-CM | POA: Diagnosis present

## 2018-08-11 DIAGNOSIS — E1165 Type 2 diabetes mellitus with hyperglycemia: Secondary | ICD-10-CM

## 2018-08-11 DIAGNOSIS — Z8249 Family history of ischemic heart disease and other diseases of the circulatory system: Secondary | ICD-10-CM | POA: Diagnosis not present

## 2018-08-11 DIAGNOSIS — I10 Essential (primary) hypertension: Secondary | ICD-10-CM

## 2018-08-11 DIAGNOSIS — E1129 Type 2 diabetes mellitus with other diabetic kidney complication: Secondary | ICD-10-CM

## 2018-08-11 DIAGNOSIS — E1152 Type 2 diabetes mellitus with diabetic peripheral angiopathy with gangrene: Secondary | ICD-10-CM | POA: Diagnosis present

## 2018-08-11 DIAGNOSIS — N183 Chronic kidney disease, stage 3 unspecified: Secondary | ICD-10-CM | POA: Diagnosis present

## 2018-08-11 DIAGNOSIS — E1122 Type 2 diabetes mellitus with diabetic chronic kidney disease: Secondary | ICD-10-CM | POA: Diagnosis present

## 2018-08-11 DIAGNOSIS — R112 Nausea with vomiting, unspecified: Secondary | ICD-10-CM | POA: Diagnosis not present

## 2018-08-11 DIAGNOSIS — J45909 Unspecified asthma, uncomplicated: Secondary | ICD-10-CM | POA: Diagnosis present

## 2018-08-11 DIAGNOSIS — E785 Hyperlipidemia, unspecified: Secondary | ICD-10-CM | POA: Diagnosis present

## 2018-08-11 DIAGNOSIS — M869 Osteomyelitis, unspecified: Secondary | ICD-10-CM | POA: Diagnosis present

## 2018-08-11 DIAGNOSIS — F17210 Nicotine dependence, cigarettes, uncomplicated: Secondary | ICD-10-CM | POA: Diagnosis present

## 2018-08-11 DIAGNOSIS — F319 Bipolar disorder, unspecified: Secondary | ICD-10-CM | POA: Diagnosis present

## 2018-08-11 DIAGNOSIS — E1143 Type 2 diabetes mellitus with diabetic autonomic (poly)neuropathy: Secondary | ICD-10-CM | POA: Diagnosis present

## 2018-08-11 DIAGNOSIS — I129 Hypertensive chronic kidney disease with stage 1 through stage 4 chronic kidney disease, or unspecified chronic kidney disease: Secondary | ICD-10-CM | POA: Diagnosis present

## 2018-08-11 DIAGNOSIS — D631 Anemia in chronic kidney disease: Secondary | ICD-10-CM | POA: Diagnosis present

## 2018-08-11 DIAGNOSIS — A419 Sepsis, unspecified organism: Secondary | ICD-10-CM

## 2018-08-11 DIAGNOSIS — E11621 Type 2 diabetes mellitus with foot ulcer: Secondary | ICD-10-CM | POA: Diagnosis present

## 2018-08-11 DIAGNOSIS — L97519 Non-pressure chronic ulcer of other part of right foot with unspecified severity: Secondary | ICD-10-CM | POA: Diagnosis present

## 2018-08-11 DIAGNOSIS — M79671 Pain in right foot: Secondary | ICD-10-CM | POA: Diagnosis present

## 2018-08-11 DIAGNOSIS — Z8673 Personal history of transient ischemic attack (TIA), and cerebral infarction without residual deficits: Secondary | ICD-10-CM | POA: Diagnosis not present

## 2018-08-11 DIAGNOSIS — Z9119 Patient's noncompliance with other medical treatment and regimen: Secondary | ICD-10-CM | POA: Diagnosis not present

## 2018-08-11 DIAGNOSIS — E11649 Type 2 diabetes mellitus with hypoglycemia without coma: Secondary | ICD-10-CM | POA: Diagnosis not present

## 2018-08-11 DIAGNOSIS — E1169 Type 2 diabetes mellitus with other specified complication: Secondary | ICD-10-CM | POA: Diagnosis present

## 2018-08-11 DIAGNOSIS — K219 Gastro-esophageal reflux disease without esophagitis: Secondary | ICD-10-CM | POA: Diagnosis present

## 2018-08-11 HISTORY — PX: AMPUTATION: SHX166

## 2018-08-11 LAB — HEMOGLOBIN A1C
Hgb A1c MFr Bld: 8 % — ABNORMAL HIGH (ref 4.8–5.6)
MEAN PLASMA GLUCOSE: 182.9 mg/dL

## 2018-08-11 LAB — CBG MONITORING, ED: Glucose-Capillary: 159 mg/dL — ABNORMAL HIGH (ref 70–99)

## 2018-08-11 LAB — I-STAT CG4 LACTIC ACID, ED: Lactic Acid, Venous: 0.69 mmol/L (ref 0.5–1.9)

## 2018-08-11 LAB — HIV ANTIBODY (ROUTINE TESTING W REFLEX): HIV Screen 4th Generation wRfx: NONREACTIVE

## 2018-08-11 LAB — GLUCOSE, CAPILLARY
GLUCOSE-CAPILLARY: 124 mg/dL — AB (ref 70–99)
GLUCOSE-CAPILLARY: 122 mg/dL — AB (ref 70–99)
GLUCOSE-CAPILLARY: 122 mg/dL — AB (ref 70–99)
Glucose-Capillary: 104 mg/dL — ABNORMAL HIGH (ref 70–99)
Glucose-Capillary: 159 mg/dL — ABNORMAL HIGH (ref 70–99)

## 2018-08-11 LAB — CBC
HEMATOCRIT: 24.2 % — AB (ref 36.0–46.0)
Hemoglobin: 7.6 g/dL — ABNORMAL LOW (ref 12.0–15.0)
MCH: 28.1 pg (ref 26.0–34.0)
MCHC: 31.4 g/dL (ref 30.0–36.0)
MCV: 89.6 fL (ref 80.0–100.0)
NRBC: 0 % (ref 0.0–0.2)
PLATELETS: 314 10*3/uL (ref 150–400)
RBC: 2.7 MIL/uL — ABNORMAL LOW (ref 3.87–5.11)
RDW: 12 % (ref 11.5–15.5)
WBC: 14.3 10*3/uL — AB (ref 4.0–10.5)

## 2018-08-11 LAB — BASIC METABOLIC PANEL
Anion gap: 11 (ref 5–15)
BUN: 20 mg/dL (ref 6–20)
CALCIUM: 8.2 mg/dL — AB (ref 8.9–10.3)
CO2: 21 mmol/L — ABNORMAL LOW (ref 22–32)
Chloride: 105 mmol/L (ref 98–111)
Creatinine, Ser: 2.6 mg/dL — ABNORMAL HIGH (ref 0.44–1.00)
GFR, EST AFRICAN AMERICAN: 26 mL/min — AB (ref >60)
GFR, EST NON AFRICAN AMERICAN: 22 mL/min — AB (ref >60)
Glucose, Bld: 160 mg/dL — ABNORMAL HIGH (ref 70–99)
Potassium: 3.6 mmol/L (ref 3.5–5.1)
Sodium: 137 mmol/L (ref 135–145)

## 2018-08-11 LAB — SURGICAL PCR SCREEN
MRSA, PCR: NEGATIVE
STAPHYLOCOCCUS AUREUS: NEGATIVE

## 2018-08-11 LAB — SEDIMENTATION RATE: Sed Rate: 128 mm/hr — ABNORMAL HIGH (ref 0–22)

## 2018-08-11 LAB — PREALBUMIN: PREALBUMIN: 11 mg/dL — AB (ref 18–38)

## 2018-08-11 LAB — C-REACTIVE PROTEIN: CRP: 7 mg/dL — ABNORMAL HIGH (ref <1.0)

## 2018-08-11 SURGERY — AMPUTATION BELOW KNEE
Anesthesia: General | Site: Leg Lower | Laterality: Right

## 2018-08-11 MED ORDER — LIDOCAINE 2% (20 MG/ML) 5 ML SYRINGE
INTRAMUSCULAR | Status: AC
  Filled 2018-08-11: qty 5

## 2018-08-11 MED ORDER — SODIUM CHLORIDE 0.9 % IV SOLN
INTRAVENOUS | Status: DC
  Administered 2018-08-11: 04:00:00 via INTRAVENOUS

## 2018-08-11 MED ORDER — ONDANSETRON HCL 4 MG/2ML IJ SOLN
INTRAMUSCULAR | Status: DC | PRN
  Administered 2018-08-11: 4 mg via INTRAVENOUS

## 2018-08-11 MED ORDER — OXYCODONE HCL 5 MG PO TABS
10.0000 mg | ORAL_TABLET | ORAL | Status: DC | PRN
  Administered 2018-08-11: 15 mg via ORAL
  Administered 2018-08-11: 10 mg via ORAL
  Administered 2018-08-12 – 2018-08-13 (×4): 15 mg via ORAL
  Administered 2018-08-13: 10 mg via ORAL
  Administered 2018-08-13 – 2018-08-14 (×6): 15 mg via ORAL
  Filled 2018-08-11 (×12): qty 3

## 2018-08-11 MED ORDER — MIDAZOLAM HCL 2 MG/2ML IJ SOLN
INTRAMUSCULAR | Status: AC
  Filled 2018-08-11: qty 2

## 2018-08-11 MED ORDER — ALPRAZOLAM 0.5 MG PO TABS
2.0000 mg | ORAL_TABLET | Freq: Four times a day (QID) | ORAL | Status: DC | PRN
  Administered 2018-08-11: 2 mg via ORAL
  Filled 2018-08-11: qty 4

## 2018-08-11 MED ORDER — ONDANSETRON HCL 4 MG/2ML IJ SOLN
INTRAMUSCULAR | Status: AC
  Filled 2018-08-11: qty 4

## 2018-08-11 MED ORDER — PROPOFOL 10 MG/ML IV BOLUS
INTRAVENOUS | Status: DC | PRN
  Administered 2018-08-11: 20 mg via INTRAVENOUS
  Administered 2018-08-11: 150 mg via INTRAVENOUS

## 2018-08-11 MED ORDER — METRONIDAZOLE IN NACL 5-0.79 MG/ML-% IV SOLN: 500.0000 mg | Freq: Three times a day (TID) | INTRAVENOUS | Status: DC

## 2018-08-11 MED ORDER — SODIUM CHLORIDE 0.9 % IV SOLN
2.0000 g | INTRAVENOUS | Status: AC
  Administered 2018-08-12: 2 g via INTRAVENOUS
  Filled 2018-08-11 (×2): qty 20

## 2018-08-11 MED ORDER — METOCLOPRAMIDE HCL 5 MG PO TABS: 5.0000 mg | ORAL_TABLET | Freq: Three times a day (TID) | ORAL | Status: DC | PRN

## 2018-08-11 MED ORDER — ATORVASTATIN CALCIUM 80 MG PO TABS
80.0000 mg | ORAL_TABLET | Freq: Every day | ORAL | Status: DC
  Administered 2018-08-11 – 2018-08-14 (×3): 80 mg via ORAL
  Filled 2018-08-11 (×4): qty 1

## 2018-08-11 MED ORDER — 0.9 % SODIUM CHLORIDE (POUR BTL) OPTIME
TOPICAL | Status: DC | PRN
  Administered 2018-08-11: 1000 mL

## 2018-08-11 MED ORDER — FENTANYL CITRATE (PF) 250 MCG/5ML IJ SOLN
INTRAMUSCULAR | Status: DC | PRN
  Administered 2018-08-11 (×5): 50 ug via INTRAVENOUS

## 2018-08-11 MED ORDER — SODIUM CHLORIDE 0.9 % IV BOLUS
1000.0000 mL | Freq: Once | INTRAVENOUS | Status: AC
  Administered 2018-08-11: 1000 mL via INTRAVENOUS

## 2018-08-11 MED ORDER — METHOCARBAMOL 500 MG PO TABS
500.0000 mg | ORAL_TABLET | Freq: Four times a day (QID) | ORAL | Status: DC | PRN
  Administered 2018-08-11 – 2018-08-13 (×5): 500 mg via ORAL
  Filled 2018-08-11 (×6): qty 1

## 2018-08-11 MED ORDER — HYDRALAZINE HCL 20 MG/ML IJ SOLN
5.0000 mg | Freq: Four times a day (QID) | INTRAMUSCULAR | Status: DC | PRN
  Administered 2018-08-13: 5 mg via INTRAVENOUS
  Filled 2018-08-11: qty 1

## 2018-08-11 MED ORDER — PIPERACILLIN-TAZOBACTAM 3.375 G IVPB 30 MIN
3.3750 g | Freq: Once | INTRAVENOUS | Status: AC
  Administered 2018-08-11: 3.375 g via INTRAVENOUS
  Filled 2018-08-11: qty 50

## 2018-08-11 MED ORDER — VANCOMYCIN HCL 10 G IV SOLR
1500.0000 mg | Freq: Once | INTRAVENOUS | Status: AC
  Administered 2018-08-11: 1500 mg via INTRAVENOUS
  Filled 2018-08-11: qty 1500

## 2018-08-11 MED ORDER — PANTOPRAZOLE SODIUM 40 MG PO TBEC
40.0000 mg | DELAYED_RELEASE_TABLET | Freq: Every day | ORAL | Status: DC
  Administered 2018-08-11 – 2018-08-15 (×5): 40 mg via ORAL
  Filled 2018-08-11 (×5): qty 1

## 2018-08-11 MED ORDER — CLONIDINE HCL 0.1 MG PO TABS
0.1000 mg | ORAL_TABLET | Freq: Two times a day (BID) | ORAL | Status: DC
  Administered 2018-08-11 – 2018-08-15 (×10): 0.1 mg via ORAL
  Filled 2018-08-11 (×10): qty 1

## 2018-08-11 MED ORDER — LABETALOL HCL 5 MG/ML IV SOLN
INTRAVENOUS | Status: DC | PRN
  Administered 2018-08-11: 5 mg via INTRAVENOUS

## 2018-08-11 MED ORDER — VANCOMYCIN HCL IN DEXTROSE 750-5 MG/150ML-% IV SOLN
750.0000 mg | INTRAVENOUS | Status: AC
  Administered 2018-08-12: 750 mg via INTRAVENOUS
  Filled 2018-08-11: qty 150

## 2018-08-11 MED ORDER — LABETALOL HCL 5 MG/ML IV SOLN
INTRAVENOUS | Status: AC
  Filled 2018-08-11: qty 4

## 2018-08-11 MED ORDER — INSULIN ASPART 100 UNIT/ML ~~LOC~~ SOLN
0.0000 [IU] | SUBCUTANEOUS | Status: DC
  Administered 2018-08-11 (×2): 2 [IU] via SUBCUTANEOUS
  Administered 2018-08-12: 1 [IU] via SUBCUTANEOUS
  Administered 2018-08-13 – 2018-08-14 (×2): 2 [IU] via SUBCUTANEOUS
  Administered 2018-08-14: 1 [IU] via SUBCUTANEOUS
  Administered 2018-08-14: 2 [IU] via SUBCUTANEOUS
  Administered 2018-08-15: 1 [IU] via SUBCUTANEOUS
  Filled 2018-08-11: qty 1

## 2018-08-11 MED ORDER — HYDROMORPHONE HCL 1 MG/ML IJ SOLN
0.5000 mg | INTRAMUSCULAR | Status: DC | PRN
  Administered 2018-08-11 – 2018-08-12 (×5): 1 mg via INTRAVENOUS
  Filled 2018-08-11 (×5): qty 1

## 2018-08-11 MED ORDER — ONDANSETRON HCL 4 MG/2ML IJ SOLN: 4.0000 mg | Freq: Four times a day (QID) | INTRAMUSCULAR | Status: DC | PRN

## 2018-08-11 MED ORDER — BISACODYL 10 MG RE SUPP: 10.0000 mg | Freq: Every day | RECTAL | Status: DC | PRN

## 2018-08-11 MED ORDER — METHOCARBAMOL 1000 MG/10ML IJ SOLN
500.0000 mg | Freq: Four times a day (QID) | INTRAVENOUS | Status: DC | PRN
  Filled 2018-08-11: qty 5

## 2018-08-11 MED ORDER — FENTANYL CITRATE (PF) 250 MCG/5ML IJ SOLN
INTRAMUSCULAR | Status: AC
  Filled 2018-08-11: qty 5

## 2018-08-11 MED ORDER — SIMVASTATIN 40 MG PO TABS: 40.0000 mg | ORAL_TABLET | Freq: Every evening | ORAL | Status: DC

## 2018-08-11 MED ORDER — HYDROMORPHONE HCL 1 MG/ML IJ SOLN
INTRAMUSCULAR | Status: AC
  Filled 2018-08-11: qty 1

## 2018-08-11 MED ORDER — GABAPENTIN 400 MG PO CAPS
800.0000 mg | ORAL_CAPSULE | Freq: Two times a day (BID) | ORAL | Status: DC
  Administered 2018-08-11 – 2018-08-15 (×10): 800 mg via ORAL
  Filled 2018-08-11 (×11): qty 2

## 2018-08-11 MED ORDER — POLYETHYLENE GLYCOL 3350 17 G PO PACK: 17.0000 g | PACK | Freq: Every day | ORAL | Status: DC | PRN

## 2018-08-11 MED ORDER — FLUTICASONE FUROATE-VILANTEROL 100-25 MCG/INH IN AEPB
1.0000 | INHALATION_SPRAY | Freq: Every day | RESPIRATORY_TRACT | Status: DC
  Filled 2018-08-11: qty 28

## 2018-08-11 MED ORDER — PRO-STAT SUGAR FREE PO LIQD
30.0000 mL | Freq: Two times a day (BID) | ORAL | Status: DC
  Administered 2018-08-11 – 2018-08-15 (×2): 30 mL via ORAL
  Filled 2018-08-11 (×5): qty 30

## 2018-08-11 MED ORDER — HYDROMORPHONE HCL 1 MG/ML IJ SOLN
0.5000 mg | INTRAMUSCULAR | Status: DC | PRN
  Administered 2018-08-13: 1 mg via INTRAVENOUS
  Filled 2018-08-11 (×2): qty 1

## 2018-08-11 MED ORDER — METRONIDAZOLE IN NACL 5-0.79 MG/ML-% IV SOLN
500.0000 mg | Freq: Three times a day (TID) | INTRAVENOUS | Status: AC
  Administered 2018-08-11 – 2018-08-12 (×5): 500 mg via INTRAVENOUS
  Filled 2018-08-11 (×6): qty 100

## 2018-08-11 MED ORDER — ALBUTEROL SULFATE (2.5 MG/3ML) 0.083% IN NEBU: 3.0000 mL | INHALATION_SOLUTION | Freq: Four times a day (QID) | RESPIRATORY_TRACT | Status: DC | PRN

## 2018-08-11 MED ORDER — DULOXETINE HCL 60 MG PO CPEP
90.0000 mg | ORAL_CAPSULE | Freq: Every day | ORAL | Status: DC
  Administered 2018-08-11 – 2018-08-15 (×5): 90 mg via ORAL
  Filled 2018-08-11 (×5): qty 1

## 2018-08-11 MED ORDER — OXYCODONE HCL 5 MG PO TABS
5.0000 mg | ORAL_TABLET | ORAL | Status: DC | PRN
  Filled 2018-08-11 (×2): qty 2

## 2018-08-11 MED ORDER — METOCLOPRAMIDE HCL 5 MG PO TABS
5.0000 mg | ORAL_TABLET | Freq: Three times a day (TID) | ORAL | Status: DC
  Administered 2018-08-11 – 2018-08-15 (×11): 5 mg via ORAL
  Filled 2018-08-11 (×12): qty 1

## 2018-08-11 MED ORDER — ONDANSETRON HCL 4 MG PO TABS: 4.0000 mg | ORAL_TABLET | Freq: Four times a day (QID) | ORAL | Status: DC | PRN

## 2018-08-11 MED ORDER — ONDANSETRON HCL 4 MG/2ML IJ SOLN
4.0000 mg | Freq: Four times a day (QID) | INTRAMUSCULAR | Status: DC | PRN
  Administered 2018-08-12 – 2018-08-13 (×2): 4 mg via INTRAVENOUS
  Filled 2018-08-11 (×2): qty 2

## 2018-08-11 MED ORDER — DEXAMETHASONE SODIUM PHOSPHATE 10 MG/ML IJ SOLN
INTRAMUSCULAR | Status: AC
  Filled 2018-08-11: qty 1

## 2018-08-11 MED ORDER — MIRTAZAPINE 15 MG PO TABS
15.0000 mg | ORAL_TABLET | Freq: Every day | ORAL | Status: DC
  Administered 2018-08-11 – 2018-08-14 (×5): 15 mg via ORAL
  Filled 2018-08-11 (×5): qty 1

## 2018-08-11 MED ORDER — HYDROMORPHONE HCL 1 MG/ML IJ SOLN
INTRAMUSCULAR | Status: AC
  Administered 2018-08-11: 1 mg
  Filled 2018-08-11: qty 1

## 2018-08-11 MED ORDER — HYDROMORPHONE HCL 1 MG/ML IJ SOLN
0.2500 mg | INTRAMUSCULAR | Status: DC | PRN
  Administered 2018-08-11 (×4): 0.5 mg via INTRAVENOUS

## 2018-08-11 MED ORDER — METOCLOPRAMIDE HCL 5 MG/ML IJ SOLN: 5.0000 mg | Freq: Three times a day (TID) | INTRAMUSCULAR | Status: DC | PRN

## 2018-08-11 MED ORDER — ACETAMINOPHEN 650 MG RE SUPP: 650.0000 mg | Freq: Four times a day (QID) | RECTAL | Status: DC | PRN

## 2018-08-11 MED ORDER — NICOTINE 21 MG/24HR TD PT24
21.0000 mg | MEDICATED_PATCH | Freq: Every day | TRANSDERMAL | Status: DC
  Administered 2018-08-11: 21 mg via TRANSDERMAL
  Filled 2018-08-11 (×4): qty 1

## 2018-08-11 MED ORDER — LURASIDONE HCL 40 MG PO TABS
120.0000 mg | ORAL_TABLET | Freq: Every day | ORAL | Status: DC
  Filled 2018-08-11 (×6): qty 3

## 2018-08-11 MED ORDER — EPHEDRINE 5 MG/ML INJ
INTRAVENOUS | Status: AC
  Filled 2018-08-11: qty 10

## 2018-08-11 MED ORDER — PROPOFOL 1000 MG/100ML IV EMUL
INTRAVENOUS | Status: AC
  Filled 2018-08-11: qty 100

## 2018-08-11 MED ORDER — SODIUM CHLORIDE 0.9 % IV SOLN: INTRAVENOUS | Status: DC

## 2018-08-11 MED ORDER — DOCUSATE SODIUM 100 MG PO CAPS
100.0000 mg | ORAL_CAPSULE | Freq: Two times a day (BID) | ORAL | Status: DC
  Administered 2018-08-11 – 2018-08-15 (×8): 100 mg via ORAL
  Filled 2018-08-11 (×8): qty 1

## 2018-08-11 MED ORDER — ADULT MULTIVITAMIN W/MINERALS CH
1.0000 | ORAL_TABLET | Freq: Every day | ORAL | Status: DC
  Administered 2018-08-11 – 2018-08-15 (×5): 1 via ORAL
  Filled 2018-08-11 (×4): qty 1

## 2018-08-11 MED ORDER — PHENYLEPHRINE 40 MCG/ML (10ML) SYRINGE FOR IV PUSH (FOR BLOOD PRESSURE SUPPORT)
PREFILLED_SYRINGE | INTRAVENOUS | Status: AC
  Filled 2018-08-11: qty 10

## 2018-08-11 MED ORDER — BUSPIRONE HCL 5 MG PO TABS
15.0000 mg | ORAL_TABLET | Freq: Three times a day (TID) | ORAL | Status: DC
  Administered 2018-08-11 – 2018-08-15 (×11): 15 mg via ORAL
  Filled 2018-08-11 (×12): qty 1

## 2018-08-11 MED ORDER — INSULIN GLARGINE 100 UNIT/ML ~~LOC~~ SOLN
15.0000 [IU] | Freq: Two times a day (BID) | SUBCUTANEOUS | Status: DC
  Administered 2018-08-11 – 2018-08-14 (×5): 15 [IU] via SUBCUTANEOUS
  Filled 2018-08-11 (×8): qty 0.15

## 2018-08-11 MED ORDER — SODIUM CHLORIDE 0.9 % IV SOLN
INTRAVENOUS | Status: DC
  Administered 2018-08-11 – 2018-08-13 (×2): via INTRAVENOUS

## 2018-08-11 MED ORDER — ACETAMINOPHEN 325 MG PO TABS: 325.0000 mg | ORAL_TABLET | Freq: Four times a day (QID) | ORAL | Status: DC | PRN

## 2018-08-11 MED ORDER — VANCOMYCIN HCL IN DEXTROSE 1-5 GM/200ML-% IV SOLN
1000.0000 mg | Freq: Once | INTRAVENOUS | Status: DC
  Filled 2018-08-11: qty 200

## 2018-08-11 MED ORDER — LIDOCAINE 2% (20 MG/ML) 5 ML SYRINGE
INTRAMUSCULAR | Status: DC | PRN
  Administered 2018-08-11: 100 mg via INTRAVENOUS

## 2018-08-11 MED ORDER — MIDAZOLAM HCL 5 MG/5ML IJ SOLN
INTRAMUSCULAR | Status: DC | PRN
  Administered 2018-08-11: 2 mg via INTRAVENOUS

## 2018-08-11 MED ORDER — MAGNESIUM CITRATE PO SOLN: 1.0000 | Freq: Once | ORAL | Status: DC | PRN

## 2018-08-11 MED ORDER — FENTANYL CITRATE (PF) 100 MCG/2ML IJ SOLN
50.0000 ug | Freq: Once | INTRAMUSCULAR | Status: AC
  Administered 2018-08-11: 50 ug via INTRAVENOUS
  Filled 2018-08-11: qty 2

## 2018-08-11 MED ORDER — PENTOXIFYLLINE ER 400 MG PO TBCR
400.0000 mg | EXTENDED_RELEASE_TABLET | Freq: Three times a day (TID) | ORAL | Status: DC
  Administered 2018-08-12 – 2018-08-15 (×7): 400 mg via ORAL
  Filled 2018-08-11 (×15): qty 1

## 2018-08-11 MED ORDER — ACETAMINOPHEN 325 MG PO TABS: 650.0000 mg | ORAL_TABLET | Freq: Four times a day (QID) | ORAL | Status: DC | PRN

## 2018-08-11 SURGICAL SUPPLY — 39 items
APL SKNCLS STERI-STRIP NONHPOA (GAUZE/BANDAGES/DRESSINGS) ×5
BENZOIN TINCTURE PRP APPL 2/3 (GAUZE/BANDAGES/DRESSINGS) ×15 IMPLANT
BLADE SAW RECIP 87.9 MT (BLADE) ×3 IMPLANT
BLADE SURG 21 STRL SS (BLADE) ×3 IMPLANT
BNDG COHESIVE 6X5 TAN STRL LF (GAUZE/BANDAGES/DRESSINGS) ×6 IMPLANT
BNDG GAUZE ELAST 4 BULKY (GAUZE/BANDAGES/DRESSINGS) ×6 IMPLANT
COVER SURGICAL LIGHT HANDLE (MISCELLANEOUS) ×3 IMPLANT
COVER WAND RF STERILE (DRAPES) ×3 IMPLANT
CUFF TOURNIQUET SINGLE 34IN LL (TOURNIQUET CUFF) IMPLANT
CUFF TOURNIQUET SINGLE 44IN (TOURNIQUET CUFF) IMPLANT
DRAPE INCISE IOBAN 66X45 STRL (DRAPES) ×3 IMPLANT
DRAPE U-SHAPE 47X51 STRL (DRAPES) ×3 IMPLANT
DRESSING PREVENA PLUS CUSTOM (GAUZE/BANDAGES/DRESSINGS) ×1 IMPLANT
DRSG PREVENA PLUS CUSTOM (GAUZE/BANDAGES/DRESSINGS) ×3
DURAPREP 26ML APPLICATOR (WOUND CARE) ×3 IMPLANT
ELECT REM PT RETURN 9FT ADLT (ELECTROSURGICAL) ×3
ELECTRODE REM PT RTRN 9FT ADLT (ELECTROSURGICAL) ×1 IMPLANT
GLOVE BIOGEL PI IND STRL 9 (GLOVE) ×1 IMPLANT
GLOVE BIOGEL PI INDICATOR 9 (GLOVE) ×2
GLOVE SURG ORTHO 9.0 STRL STRW (GLOVE) ×3 IMPLANT
GOWN STRL REUS W/ TWL XL LVL3 (GOWN DISPOSABLE) ×2 IMPLANT
GOWN STRL REUS W/TWL XL LVL3 (GOWN DISPOSABLE) ×6
KIT BASIN OR (CUSTOM PROCEDURE TRAY) ×3 IMPLANT
KIT TURNOVER KIT B (KITS) ×3 IMPLANT
MANIFOLD NEPTUNE II (INSTRUMENTS) ×3 IMPLANT
NS IRRIG 1000ML POUR BTL (IV SOLUTION) ×3 IMPLANT
PACK ORTHO EXTREMITY (CUSTOM PROCEDURE TRAY) ×3 IMPLANT
PAD ARMBOARD 7.5X6 YLW CONV (MISCELLANEOUS) ×3 IMPLANT
PREVENA RESTOR ARTHOFORM 46X30 (CANNISTER) ×3 IMPLANT
SPONGE LAP 18X18 X RAY DECT (DISPOSABLE) IMPLANT
STAPLER VISISTAT 35W (STAPLE) IMPLANT
STOCKINETTE IMPERVIOUS LG (DRAPES) ×3 IMPLANT
SUT SILK 2 0 (SUTURE) ×3
SUT SILK 2-0 18XBRD TIE 12 (SUTURE) ×1 IMPLANT
SUT VIC AB 1 CTX 27 (SUTURE) IMPLANT
TOWEL GREEN STERILE (TOWEL DISPOSABLE) ×3 IMPLANT
TUBE CONNECTING 12'X1/4 (SUCTIONS) ×1
TUBE CONNECTING 12X1/4 (SUCTIONS) ×2 IMPLANT
YANKAUER SUCT BULB TIP NO VENT (SUCTIONS) ×3 IMPLANT

## 2018-08-11 NOTE — Anesthesia Procedure Notes (Signed)
Procedure Name: LMA Insertion Date/Time: 08/11/2018 11:43 AM Performed by: Cleda Daub, CRNA Pre-anesthesia Checklist: Patient identified, Emergency Drugs available, Suction available and Patient being monitored Patient Re-evaluated:Patient Re-evaluated prior to induction Oxygen Delivery Method: Circle system utilized Preoxygenation: Pre-oxygenation with 100% oxygen Induction Type: IV induction LMA: LMA inserted LMA Size: 4.0 Number of attempts: 1 Placement Confirmation: ETT inserted through vocal cords under direct vision,  positive ETCO2 and breath sounds checked- equal and bilateral Tube secured with: Tape Dental Injury: Teeth and Oropharynx as per pre-operative assessment

## 2018-08-11 NOTE — ED Notes (Signed)
ED Provider at bedside. 

## 2018-08-11 NOTE — Transfer of Care (Signed)
Immediate Anesthesia Transfer of Care Note  Patient: Kathy Phelps  Procedure(s) Performed: AMPUTATION BELOW KNEE (Right Leg Lower)  Patient Location: PACU  Anesthesia Type:General  Level of Consciousness: awake, alert , oriented and patient cooperative  Airway & Oxygen Therapy: Patient Spontanous Breathing and Patient connected to face mask oxygen  Post-op Assessment: Report given to RN and Post -op Vital signs reviewed and stable  Post vital signs: Reviewed and stable  Last Vitals:  Vitals Value Taken Time  BP 158/92 08/11/2018 12:20 PM  Temp    Pulse 85 08/11/2018 12:24 PM  Resp 16 08/11/2018 12:24 PM  SpO2 100 % 08/11/2018 12:24 PM  Vitals shown include unvalidated device data.  Last Pain:  Vitals:   08/11/18 0800  TempSrc:   PainSc: Asleep         Complications: No apparent anesthesia complications

## 2018-08-11 NOTE — Op Note (Signed)
   Date of Surgery: 08/11/2018  INDICATIONS: Ms. Whitmyer is a 37 y.o.-year-old female who has had chronic gangrenous change of the right foot with osteomyelitis and ulceration.  Patient presents at this time for surgical intervention due to increasing pain.  PREOPERATIVE DIAGNOSIS: Gangrene osteomyelitis and ulceration right foot  POSTOPERATIVE DIAGNOSIS: Same.  PROCEDURE: Transtibial amputation Application of Prevena wound VAC  SURGEON: Sharol Given, M.D.  ANESTHESIA:  general  IV FLUIDS AND URINE: See anesthesia.  ESTIMATED BLOOD LOSS: See anesthesia notes mL.  COMPLICATIONS: None.  DESCRIPTION OF PROCEDURE: The patient was brought to the operating room and underwent a general anesthetic. After adequate levels of anesthesia were obtained patient's lower extremity was prepped using DuraPrep draped into a sterile field. A timeout was called. The foot was draped out of the sterile field with impervious stockinette. A transverse incision was made 11 cm distal to the tibial tubercle. This curved proximally and a large posterior flap was created. The tibia was transected 1 cm proximal to the skin incision. The fibula was transected just proximal to the tibial incision. The tibia was beveled anteriorly. A large posterior flap was created. The sciatic nerve was pulled cut and allowed to retract. The vascular bundles were suture ligated with 2-0 silk. The deep and superficial fascial layers were closed using #1 Vicryl. The skin was closed using staples and 2-0 nylon. The wound was covered with a Praveena restore dressing and Prevena wound VAC. There was a good suction fit. A prosthetic shrinker will be applied on the floor with a limb protector. Patient was extubated taken to the PACU in stable condition.   DISCHARGE PLANNING:  Antibiotic duration: Continue IV antibiotics for 24 hours postoperatively  Weightbearing: Nonweightbearing on the right  Pain medication: High-dose opioid pathway ordered    Dressing care/ Wound VAC: Continue wound VAC at discharge  Discharge to: Anticipate discharge to skilled nursing.  Follow-up: In the office 1 week post operative.  Meridee Score, MD [place] 12:22 PM

## 2018-08-11 NOTE — Progress Notes (Signed)
Orthopedic Tech Progress Note Patient Details:  Kathy Phelps [birth year] [id number]  Patient ID: Kathy Phelps, female   DOB: 07-Jul-1981, 37 y.o.   MRN: [id number]   Hildred Priest 08/11/2018, 3:03 PM Called in [facility]anger brace order; spoke with [facility]

## 2018-08-11 NOTE — ED Provider Notes (Signed)
[facility] EMERGENCY DEPARTMENT Provider Note   CSN: [id number] Arrival date & time: 08/10/18  2028     History   Chief Complaint Chief Complaint  Patient presents with  . Peripheral Neuropathy    HPI Kathy Phelps is a 37 y.o. female.   37 year old female with a history of hypertension, dyslipidemia, diabetes, ongoing tobacco use, s/p L BKA presents to the emergency department for evaluation of worsening pain to her right foot and digits.  She was seen by Dr. Sharol Given in the office on 07/25/2018 for worsening gangrene. Was recommended for transtibial amputation of the RLE. States that she was scared about proceeding with the surgery and subsequently "blew him (Dr. Sharol Given) off" on pending surgical dates. Has had worsening pain that she can no longer bear, prompting her visit. States that she is now prepared for operative management. Denies outpatient fevers. States that her gangrene has been worsening since last seen in the orthopedic office.     Past Medical History:  Diagnosis Date  . Anxiety   . Asthma   . Bipolar affective ([place])   . Complete miscarriage   . Constipation   . 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 Depression   . Diabetes mellitus    Type II  . Dyspnea     " only at night and I stop breathing in my sleep too for about the last three weeks"  . GERD (gastroesophageal reflux disease)   . Headache    "migraraines"  . Hyperlipidemia   . Hypertension   . Osteomyelitis ([place])    left foot  . Pregnancy complication    HELP Syndrome  . Renal disorder   . Schizophrenia ([place])   . Stroke [facility])    " mild stroke", memory loss- approx 2017    Patient Active Problem List   Diagnosis Date Noted  . Lower limb ischemia 06/25/2018  . Lymphangitis 06/25/2018  . Menorrhagia 06/25/2018  . Hx of BKA, left ([place]) 05/24/2018  . History of left below knee amputation ([place]) 02/01/2018  . Gangrene of left foot ([place])   . Dehiscence of  amputation stump ([place]) 01/26/2018  . Surgical wound, non healing 09/02/2017  . Bipolar 1 disorder ([place]) 09/02/2017  . Nausea vomiting and diarrhea 08/11/2017  . Great toe pain, left 08/11/2017  . GERD (gastroesophageal reflux disease) 08/10/2017  . Anxiety 08/10/2017  . Edema   . Cellulitis   . AKI (acute kidney injury) ([place]) 02/04/2017  . Type II diabetes mellitus with renal manifestations, uncontrolled ([place]) 02/02/2017  . HLD (hyperlipidemia) 02/02/2017  . Tobacco abuse 02/02/2017  . Essential hypertension 02/02/2017  . Malnutrition of moderate degree 02/02/2017    Past Surgical History:  Procedure Laterality Date  . ABDOMINAL AORTOGRAM N/A 09/07/2017   Procedure: ABDOMINAL AORTOGRAM;  Surgeon: Waynetta Sandy, MD;  Location: [place] [facility];  Service: Cardiovascular;  Laterality: N/A;  . ABDOMINAL AORTOGRAM W/LOWER EXTREMITY N/A 02/03/2017   Procedure: Abdominal Aortogram w/Lower Extremity;  Surgeon: Waynetta Sandy, MD;  Location: [place] [facility];  Service: Cardiovascular;  Laterality: N/A;  . ABDOMINAL AORTOGRAM W/LOWER EXTREMITY N/A 06/27/2018   Procedure: ABDOMINAL AORTOGRAM W/LOWER EXTREMITY;  Surgeon: Serafina Mitchell, MD;  Location: [place] [facility];  Service: Cardiovascular;  Laterality: N/A;  . AMPUTATION Left 02/10/2017   Procedure: AMPUTATION TOES 3, 4 AND 5  LEFT FOOT;  Surgeon: Waynetta Sandy, MD;  Location: [place];  Service: Vascular;  Laterality: Left;  . AMPUTATION Left 12/21/2017   Procedure: LEFT FOOT 5TH RAY AMPUTATION;  Surgeon: Newt Minion, MD;  Location: [place];  Service: Orthopedics;  Laterality: Left;  . AMPUTATION Left 02/01/2018   Procedure: LEFT BELOW KNEE AMPUTATION;  Surgeon: Newt Minion, MD;  Location: [place];  Service: Orthopedics;  Laterality: Left;  . CESAREAN SECTION    . CHOLECYSTECTOMY    . LOWER EXTREMITY ANGIOGRAPHY Bilateral 09/07/2017   Procedure: Lower Extremity Angiography;  Surgeon: Waynetta Sandy, MD;  Location: [place] [facility];  Service: Cardiovascular;  Laterality: Bilateral;  . PERIPHERAL VASCULAR ATHERECTOMY  02/03/2017   Procedure: Peripheral Vascular Atherectomy;  Surgeon: Waynetta Sandy, MD;  Location: [place] [facility];  Service: Cardiovascular;;  . PERIPHERAL VASCULAR ATHERECTOMY Right 06/27/2018   Procedure: PERIPHERAL VASCULAR ATHERECTOMY;  Surgeon: Serafina Mitchell, MD;  Location: [place] [facility];  Service: Cardiovascular;  Laterality: Right;  superficial femoral  . PERIPHERAL VASCULAR BALLOON ANGIOPLASTY  02/03/2017   Procedure: Peripheral Vascular Balloon Angioplasty;  Surgeon: Waynetta Sandy, MD;  Location: [place] [facility];  Service: Cardiovascular;;  . PERIPHERAL VASCULAR INTERVENTION Left 09/07/2017   Procedure: PERIPHERAL VASCULAR INTERVENTION;  Surgeon: Waynetta Sandy, MD;  Location: [place] [facility];  Service: Cardiovascular;  Laterality: Left;  SFA/POPLITEAL  . STUMP REVISION Left 05/24/2018  . STUMP REVISION Left 05/24/2018   Procedure: REVISION LEFT BELOW KNEE AMPUTATION;  Surgeon: Newt Minion, MD;  Location: [place];  Service: Orthopedics;  Laterality: Left;  . TUBAL LIGATION       OB History   None      Home Medications    Prior to Admission medications   Medication Sig Start Date End Date Taking? Authorizing Provider  ADVAIR DISKUS 100-50 MCG/DOSE AEPB Inhale 1 puff into the lungs daily. 01/10/15  Yes [provider]  alprazolam Duanne Moron) 2 MG tablet Take 1 tablet (2 mg total) by mouth 4 (four) times daily as needed for sleep. Patient taking differently: Take 2 mg by mouth 5 (five) times daily.  08/12/17  Yes Debbe Odea, MD  aspirin EC 81 MG tablet Take 1 tablet (81 mg total) by mouth daily. 07/03/18 07/03/19 Yes [name], MD  atorvastatin (LIPITOR) 80 MG tablet Take 1 tablet (80 mg total) by mouth daily at 6 PM. 07/03/18  Yes [name], MD  busPIRone (BUSPAR) 15 MG tablet Take 15  mg by mouth 3 (three) times daily. 06/09/18  Yes [provider]  cloNIDine (CATAPRES) 0.1 MG tablet Take 0.1 mg by mouth 2 (two) times daily. 05/12/18  Yes [provider]  clopidogrel (PLAVIX) 75 MG tablet Take 1 tablet (75 mg total) by mouth daily with breakfast. 09/08/17  Yes Rhyne, Samantha J, PA-C  DULoxetine (CYMBALTA) 30 MG capsule Take 90 mg by mouth daily.  06/09/18  Yes [provider]  famotidine (PEPCID) 20 MG tablet Take 1 tablet (20 mg total) by mouth 2 (two) times daily. Patient taking differently: Take 20 mg by mouth daily.  08/12/17  Yes Debbe Odea, MD  gabapentin (NEURONTIN) 800 MG tablet Take 1 tablet (800 mg total) by mouth 2 (two) times daily. Patient taking differently: Take 800 mg by mouth 5 (five) times daily.  02/11/17  Yes Hosie Poisson, MD  insulin glargine (LANTUS) 100 UNIT/ML injection Inject 0.2-0.3 mLs (20-30 Units total) into the skin 2 (two) times daily. 20 U in the morning and 30 U in the evening Patient taking differently: Inject  30 Units into the skin 2 (two) times daily.  08/12/17  Yes Gherghe, Costin M, MD  LATUDA 120 MG TABS Take 120 mg by mouth at bedtime. 04/12/16  Yes [provider]  metFORMIN (GLUCOPHAGE) 1000 MG tablet Take 1 tablet (1,000 mg total) by mouth 2 (two) times daily. Patient taking differently: Take 500 mg by mouth 2 (two) times daily with a meal.  07/16/14  Yes Pisciotta, Elmyra Ricks, PA-C  metoCLOPramide (REGLAN) 5 MG tablet Take 1 tablet (5 mg total) by mouth 3 (three) times daily before meals. 09/08/17  Yes Bonnielee Haff, MD  mirtazapine (REMERON) 15 MG tablet Take 15 mg by mouth at bedtime. 12/22/17  Yes [provider]  naproxen (NAPROSYN) 500 MG tablet Take 500 mg by mouth 2 (two) times daily. 05/12/18  Yes [provider]  nicotine (NICODERM CQ - DOSED IN MG/24 HOURS) 21 mg/24hr patch Place 1 patch (21 mg total) onto the skin daily. 02/12/17  Yes Hosie Poisson, MD  nitroGLYCERIN (NITRO-DUR) 0.2  mg/hr patch Place 1 patch (0.2 mg total) onto the skin daily. 01/16/18  Yes Newt Minion, MD  NOVOLOG 100 UNIT/ML injection Inject 0-30 Units into the skin 3 (three) times daily with meals. Sliding Scale:  >300 15 units < 300 do not use 08/12/17  Yes Gherghe, Vella Redhead, MD  oxyCODONE-acetaminophen (PERCOCET/ROXICET) 5-325 MG tablet Take 1-2 tablets by mouth every 4 (four) hours as needed for severe pain. 07/21/18  Yes Rayburn, Neta Mends, PA-C  pantoprazole (PROTONIX) 40 MG tablet Take 1 tablet (40 mg total) by mouth daily. 02/11/17  Yes Hosie Poisson, MD  pentoxifylline (TRENTAL) 400 MG CR tablet Take 1 tablet (400 mg total) by mouth 3 (three) times daily with meals. 01/16/18  Yes Newt Minion, MD  PROAIR HFA 108 [phone] BASE) MCG/ACT inhaler Inhale 2 puffs into the lungs every 6 (six) hours as needed for shortness of breath.  01/10/15  Yes [provider]  silver sulfADIAZINE (SILVADENE) 1 % cream Apply 1 application topically daily. 04/20/18  Yes Dondra Prader R, NP  simvastatin (ZOCOR) 40 MG tablet Take 40 mg by mouth every evening.  04/25/16  Yes [provider]  zolpidem (AMBIEN) 10 MG tablet Take 10 mg by mouth at bedtime as needed for sleep. 04/26/16  Yes [provider]  Insulin Syringe-Needle U-100 (INSULIN SYRINGE .5CC/31GX5/16") 31G X 5/16" 0.5 ML MISC 1 each by Does not apply route QID. 08/12/17   Gherghe, Vella Redhead, MD  metoprolol tartrate (LOPRESSOR) 25 MG tablet Take 0.5 tablets (12.5 mg total) by mouth 2 (two) times daily. Patient not taking: Reported on 08/11/2018 02/11/17   Hosie Poisson, MD    Family History Family History  Problem Relation Age of Onset  . Diabetes Mellitus II Mother   . Heart disease Mother   . Heart disease Father     Social History Social History   Tobacco Use  . Smoking status: Current Every Day Smoker    Packs/day: 1.00    Years: 28.00    Pack years: 28.00    Types: Cigarettes  . Smokeless tobacco: Never Used  Substance Use  Topics  . Alcohol use: No  . Drug use: No     Allergies   Hydrocodone-acetaminophen and Tylenol [acetaminophen]   Review of Systems Review of Systems Ten systems reviewed and are negative for acute change, except as noted in the HPI.    Physical Exam Updated Vital Signs BP (!) 167/103   Pulse 94  Temp 98.8 F (37.1 C) (Oral)   Resp 16   Ht '5\' 9"'  (1.753 m)   Wt 72.6 kg   LMP 08/10/2018   SpO2 100%   BMI 23.63 kg/m   Physical Exam  Constitutional: She is oriented to person, place, and time. She appears well-developed and well-nourished. No distress.  Nontoxic appearing and in NAD  HENT:  Head: Normocephalic and atraumatic.  Eyes: Conjunctivae and EOM are normal. No scleral icterus.  Neck: Normal range of motion.  Cardiovascular: Normal rate and regular rhythm.  Pulmonary/Chest: Effort normal. No respiratory distress.  Respirations even and unlabored  Musculoskeletal:  Dry gangrene to digits of the R foot, worse to the 4th and 5th digits. Mild purulence noted to the base of the 4th and 5th digits. See image for further detail.  Neurological: She is alert and oriented to person, place, and time. She exhibits normal muscle tone. Coordination normal.  Skin: Skin is warm and dry. She is not diaphoretic. There is erythema (mild to dorsum R foot extending proximally). There is pallor (digits of R foot).  Psychiatric: She has a normal mood and affect. Her behavior is normal.  Nursing note and vitals reviewed.         ED Treatments / Results  Labs (all labs ordered are listed, but only abnormal results are displayed) Labs Reviewed  COMPREHENSIVE METABOLIC PANEL - Abnormal; Notable for the following components:      Result Value   CO2 19 (*)    Glucose, Bld 199 (*)    Creatinine, Ser 2.62 (*)    Albumin 2.7 (*)    GFR calc non Af Amer 22 (*)    GFR calc Af Amer 25 (*)    All other components within normal limits  CBC WITH DIFFERENTIAL/PLATELET - Abnormal;  Notable for the following components:   WBC 15.5 (*)    RBC 3.07 (*)    Hemoglobin 8.4 (*)    HCT 27.4 (*)    Platelets 412 (*)    Neutro Abs 12.0 (*)    All other components within normal limits  CULTURE, BLOOD (ROUTINE X 2)  CULTURE, BLOOD (ROUTINE X 2)  PROTIME-INR  LIPASE, BLOOD  URINALYSIS, ROUTINE W REFLEX MICROSCOPIC  I-STAT CG4 LACTIC ACID, ED  I-STAT BETA HCG BLOOD, ED ([facility], [facility], AP ONLY)  I-STAT CG4 LACTIC ACID, ED    EKG None  Radiology Dg Foot Complete Right  Result Date: 08/10/2018 CLINICAL DATA:  Initial evaluation for acute pain and soreness, concern for infection. History of diabetes. EXAM: RIGHT FOOT COMPLETE - 3+ VIEW COMPARISON:  Prior radiograph from 08/03/2008. FINDINGS: Soft tissue irregularity with ulceration overlies the lateral aspect of the right forefoot at the level of the right fourth and fifth digits. Probable involvement of the right third digit as well. Findings concerning for possible soft tissue infection given provided history. No convincing osseous erosions or periosteal reaction to suggest acute osteomyelitis. No acute fracture or dislocation. Mild scattered degenerative changes noted about the foot and ankle. IMPRESSION: 1. Soft tissue irregularity with ulceration involving the right for foot, most evident at the right third through fifth digits. Findings concerning for soft tissue infection/cellulitis given provided history. No convincing radiographic evidence for active osteomyelitis at this time. 2. No other acute osseous abnormality. Electronically Signed   By: Jeannine Boga M.D.   On: 08/10/2018 22:07    Procedures Procedures (including critical care time)  Medications Ordered in ED Medications  vancomycin (VANCOCIN) IVPB 1000 mg/200 mL  premix (has no administration in time range)  piperacillin-tazobactam (ZOSYN) IVPB 3.375 g (has no administration in time range)  fentaNYL (SUBLIMAZE) injection 50 mcg (50 mcg Intravenous Given 08/11/18  0056)    CRITICAL CARE Performed by: Antonietta Breach   Total critical care time: 35 minutes  Critical care time was exclusive of separately billable procedures and treating other patients.  Critical care was necessary to treat or prevent imminent or life-threatening deterioration.  Critical care was time spent personally by me on the following activities: development of treatment plan with patient and/or surrogate as well as nursing, discussions with consultants, evaluation of patient's response to treatment, examination of patient, obtaining history from patient or surrogate, ordering and performing treatments and interventions, ordering and review of laboratory studies, ordering and review of radiographic studies, pulse oximetry and re-evaluation of patient's condition.   Initial Impression / Assessment and Plan / ED Course  I have reviewed the triage vital signs and the nursing notes.  Pertinent labs & imaging results that were available during my care of the patient were reviewed by me and considered in my medical decision making (see chart for details).     37 year old female presents to the emergency department for worsening right foot pain.  She has dry gangrene with the potential for associated cellulitis.  Patient noted to be tachycardic on arrival with leukocytosis of 15.5.  She has some slight erythema to the dorsum of her foot.  She was started on vancomycin and Zosyn for broad coverage as she met sepsis criteria.  Will also give IV fluids.  Patient to remain n.p.o. pending potential for surgery in the AM.  Dr. Alcario Drought of [facility] to admit.   Final Clinical Impressions(s) / ED Diagnoses   Final diagnoses:  Gangrene of right foot ([place])  Sepsis, due to unspecified organism, unspecified whether acute organ dysfunction present [facility])  AKI (acute kidney injury) [facility])    ED Discharge Orders    None       Antonietta Breach, PA-C 08/11/18 0100    Veryl Speak, MD 08/11/18 [phone]

## 2018-08-11 NOTE — Clinical Social Work Note (Signed)
CSW acknowledges consult "Access Meds for Discharge." Please consult RNCM.  Following for PT recommendations.  Kathy Phelps, [place]

## 2018-08-11 NOTE — Progress Notes (Signed)
Patient transferred from ED/ Radiology via stretcher. Patient a/o X 4, showing no signs of distress. Skin swarm completed by this RN, and Child psychotherapist. Patient noted to have gangrene to the right foot/ 4th-5th toe, no other skin break down noted upon assessment. RN to continue to monitor.

## 2018-08-11 NOTE — Progress Notes (Signed)
Initial Nutrition Assessment  DOCUMENTATION CODES:   Not applicable  INTERVENTION:    Pro-stat 30 ml BID, each supplement provides 100 kcal and 15 gm protein   Multivitamin daily  NUTRITION DIAGNOSIS:   Increased nutrient needs related to wound healing as evidenced by estimated needs.  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 For amputation 11/1.  Patient is currently in the OR for surgery (R BKA). Unable to obtain nutrition hx or complete nutrition focused physical exam at this time.  Labs reviewed. Creatinine 2.6 (H), prealbumin 11 (L) CBG's: [phone] Medications reviewed and include novolog, lantus, reglan, remeron.   NUTRITION - FOCUSED PHYSICAL EXAM:  unable to complete NFPE at this time  Diet Order:   Diet Order            Diet NPO time specified Except for: Sips with Meds  Diet effective now              EDUCATION NEEDS:   Not appropriate for education at this time  Skin:  Skin Assessment: Skin Integrity Issues: Skin Integrity Issues:: Other (Comment) Other: L leg amputation site; R foot gangrene  Last BM:  10/31  Height:   Ht Readings from Last 1 Encounters:  08/11/18 5\' 9"  (1.753 m)    Weight:   Wt Readings from Last 1 Encounters:  08/11/18 74.3 kg    Ideal Body Weight:  61.6 kg  BMI:  25.9 (adjusted for L BKA)  Estimated Nutritional Needs:   Kcal:  2000-2200  Protein:  100-115 gm  Fluid:  2-2.2 L    Molli Barrows, RD, LDN, CNSC Pager [phone] After Hours Pager [phone]

## 2018-08-11 NOTE — ED Notes (Signed)
Patient transported to Ultrasound 

## 2018-08-11 NOTE — Progress Notes (Signed)
Pharmacy Antibiotic Note  Kathy Phelps is a 37 y.o. female admitted on 08/10/2018 with diabetic foot infection.  Pharmacy has been consulted for vancomycin dosing.  Rocephin and Flagyl also ordered by admitting provider.  Plan: Vancomycin 1500mg  x1 then 750mg  IV every 24 hours.  Goal trough 10-20 mcg/mL.  Height: 5\' 9"  (175.3 cm) Weight: 160 lb (72.6 kg) IBW/kg (Calculated) : 66.2  Temp (24hrs), Avg:99.2 F (37.3 C), Min:98.8 F (37.1 C), Max:99.6 F (37.6 C)  Recent Labs  Lab 08/10/18 2055 08/10/18 2115 08/11/18 0048  WBC 15.5*  --   --   CREATININE 2.62*  --   --   LATICACIDVEN  --  1.47 0.69    Estimated Creatinine Clearance: 30.1 mL/min (A) (by C-G formula based on SCr of 2.62 mg/dL (H)).    Allergies  Allergen Reactions  . Hydrocodone-Acetaminophen Hives  . Tylenol [Acetaminophen] Itching and Swelling    Patient tolerated APAP 650 mg (06/29/18) as well as oxycodone/APAP 5-325 mg during 06/25/2018 admission.     Thank you for allowing pharmacy to be a part of this patient's care.  Wynona Neat, PharmD, BCPS  08/11/2018 1:39 AM

## 2018-08-11 NOTE — Progress Notes (Signed)
[facility] PROGRESS NOTE    Progress Note  Kathy Phelps  QQP:[id number] DOB: [birth year] DOA: 08/10/2018 PCP: Patient, No Pcp Per     Brief Narrative:   Kathy Phelps is an 37 y.o. 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 ESR 28 Blood cultures remain negative till date Continue narcotics for pain Patient is currently n.p.o., for possible surgical intervention awaiting orthopedics recommendations. Consult physical therapy postsurgical. She relates she cannot go to skilled nursing facility she has 3 teenagers at home she needs to go home will contact case manager to assist as much as possible for when she goes home.  Essential hypertension: Mildly elevated. Hydralazine as needed  AKI (acute kidney injury) ([place]) on CKD stage 3 secondary to diabetes [facility]): With a baseline creatinine of 1.7-2.0. Likely prerenal etiology in the setting of infectious etiology and NSAID use. Discontinue NSAIDs start IV fluids. Renal ultrasound was normal. Rise and O's Daily weights.  Bipolar 1 disorder ([facility]): Seems to be well controlled continue current medication.  Hx of BKA, left ([place])     DVT prophylaxis: lovenox Family Communication:none Disposition Plan/Barrier to D/C: unable to determine Code Status:     Code Status Orders  (From admission, onward)         Start     Ordered   08/11/18 0115  Full  code  Continuous     08/11/18 0118        Code Status History    Date Active Date Inactive Code Status Order ID Comments User Context   06/25/2018 0835 07/03/2018 1502 Full Code [id number]  [name], MD ED   05/24/2018 1321 05/25/2018 1530 Full Code [id number]  Newt Minion, MD Inpatient   02/01/2018 1739 02/06/2018 1722 Full Code [id number]  Newt Minion, MD Inpatient   09/07/2017 1324 09/08/2017 1432 Full Code [id number]  Waynetta Sandy, MD Inpatient   09/02/2017 1946 09/07/2017 1324 Full Code [id number]  Geradine Girt, DO Inpatient   08/11/2017 0031 08/12/2017 2223 Full Code [id number]  Ivor Costa, MD ED   02/02/2017 0447 02/11/2017 2202 Full Code [id number]  Rise Patience, MD Inpatient        IV Access:    Peripheral IV   Procedures and diagnostic studies:   US Renal  Result Date: 08/11/2018 CLINICAL DATA:  37 year old female with acute renal insufficiency. EXAM: RENAL / URINARY TRACT ULTRASOUND COMPLETE COMPARISON:  Abdominal CT dated 06/25/2018 FINDINGS: Right Kidney: Renal measurements: 11.5 x 4.6 x 5.1 cm = volume: 145.8 mL. Normal echogenicity. No hydronephrosis or shadowing stone. Left Kidney: Renal measurements: 12.4 x 5.3 x 5.5 cm = volume: 193.3 mL. Normal echogenicity. No hydronephrosis or shadowing stone. Bladder: Appears normal for degree of bladder distention. IMPRESSION: Unremarkable renal ultrasound. Electronically Signed   By: Anner Crete M.D.   On: 08/11/2018 03:54   Dg Foot Complete Right  Result Date: 08/10/2018 CLINICAL DATA:  Initial evaluation for acute pain and soreness, concern for  infection. History of diabetes. EXAM: RIGHT FOOT COMPLETE - 3+ VIEW COMPARISON:  Prior radiograph from 08/03/2008. FINDINGS: Soft tissue irregularity with ulceration overlies the lateral aspect of the right forefoot at the level of the right fourth and fifth digits. Probable involvement of the right third digit as well. Findings concerning for possible soft  tissue infection given provided history. No convincing osseous erosions or periosteal reaction to suggest acute osteomyelitis. No acute fracture or dislocation. Mild scattered degenerative changes noted about the foot and ankle. IMPRESSION: 1. Soft tissue irregularity with ulceration involving the right for foot, most evident at the right third through fifth digits. Findings concerning for soft tissue infection/cellulitis given provided history. No convincing radiographic evidence for active osteomyelitis at this time. 2. No other acute osseous abnormality. Electronically Signed   By: Jeannine Boga M.D.   On: 08/10/2018 22:07     Medical Consultants:    None.  Anti-Infectives:   IV vancomycin Rocephin and Flagyl  Subjective:    Kathy Phelps she relates she is nervous scared she would like she cannot go to skilled nursing facility she has 3 young teenagers at home.  Objective:    Vitals:   08/11/18 0130 08/11/18 0200 08/11/18 0334 08/11/18 0352  BP: (!) 159/95 (!) 150/79 (!) 152/92   Pulse: 89 86 91   Resp:  14 18   Temp:   98.5 F (36.9 C)   TempSrc:   Oral   SpO2: 98% 98% 100%   Weight:    74.3 kg  Height:   '5\' 9"'  (1.753 m)     Intake/Output Summary (Last 24 hours) at 08/11/2018 0844 Last data filed at 08/11/2018 0400 Gross per 24 hour  Intake 2440.97 ml  Output 400 ml  Net 2040.97 ml   Filed Weights   08/10/18 2041 08/11/18 0352  Weight: 72.6 kg 74.3 kg    Exam: General exam: In no acute distress. Respiratory system: Good air movement and clear to auscultation. Cardiovascular system: S1 & S2 heard, RRR.  Gastrointestinal system: Abdomen is nondistended, soft and nontender.  Central nervous system: Alert and oriented. No focal neurological deficits. Extremities: No pedal edema. Skin: No rashes, lesions or ulcers Psychiatry: Judgement and insight appear normal. Mood & affect appropriate.    Data Reviewed:    Labs: Basic Metabolic Panel: Recent  Labs  Lab 08/10/18 2055 08/11/18 0212  NA 136 137  K 3.7 3.6  CL 102 105  CO2 19* 21*  GLUCOSE 199* 160*  BUN 20 20  CREATININE 2.62* 2.60*  CALCIUM 9.2 8.2*   GFR Estimated Creatinine Clearance: 30.4 mL/min (A) (by C-G formula based on SCr of 2.6 mg/dL (H)). Liver Function Tests: Recent Labs  Lab 08/10/18 2055  AST 18  ALT 9  ALKPHOS 85  BILITOT 0.4  PROT 7.8  ALBUMIN 2.7*   Recent Labs  Lab 08/10/18 2055  LIPASE 27   No results for input(s): AMMONIA in the last 168 hours. Coagulation profile Recent Labs  Lab 08/10/18 2055  INR 1.08    CBC: Recent Labs  Lab 08/10/18 2055 08/11/18 0212  WBC 15.5* 14.3*  NEUTROABS 12.0*  --   HGB 8.4* 7.6*  HCT 27.4* 24.2*  MCV 89.3 89.6  PLT 412* 314   Cardiac Enzymes: No results for input(s): CKTOTAL, CKMB, CKMBINDEX, TROPONINI in the last 168 hours. BNP (last 3 results) No results for input(s): PROBNP in the last 8760 hours. CBG: Recent Labs  Lab 08/11/18 0216 08/11/18 0344  GLUCAP 159*  124*   D-Dimer: No results for input(s): DDIMER in the last 72 hours. Hgb A1c: Recent Labs    08/11/18 0212  HGBA1C 8.0*   Lipid Profile: No results for input(s): CHOL, HDL, LDLCALC, TRIG, CHOLHDL, LDLDIRECT in the last 72 hours. Thyroid function studies: No results for input(s): TSH, T4TOTAL, T3FREE, THYROIDAB in the last 72 hours.  Invalid input(s): FREET3 Anemia work up: No results for input(s): VITAMINB12, FOLATE, FERRITIN, TIBC, IRON, RETICCTPCT in the last 72 hours. Sepsis Labs: Recent Labs  Lab 08/10/18 2055 08/10/18 2115 08/11/18 0048 08/11/18 0212  WBC 15.5*  --   --  14.3*  LATICACIDVEN  --  1.47 0.69  --    Microbiology No results found for this or any previous visit (from the past 240 hour(s)).   Medications:   . atorvastatin  80 mg Oral q1800  . busPIRone  15 mg Oral TID  . cloNIDine  0.1 mg Oral BID  . DULoxetine  90 mg Oral Daily  . fluticasone furoate-vilanterol  1 puff Inhalation Daily   . gabapentin  800 mg Oral BID  . insulin aspart  0-9 Units Subcutaneous Q4H  . insulin glargine  15 Units Subcutaneous BID  . lurasidone  120 mg Oral QHS  . metoCLOPramide  5 mg Oral TID AC  . mirtazapine  15 mg Oral QHS  . nicotine  21 mg Transdermal Daily  . pantoprazole  40 mg Oral Daily  . pentoxifylline  400 mg Oral TID WC   Continuous Infusions: . sodium chloride 100 mL/hr at 08/11/18 0350  . cefTRIAXone (ROCEPHIN)  IV    . metronidazole    . [START ON 08/12/2018] vancomycin        LOS: 0 days   Charlynne Cousins  [facility] Pager [phone]  *Please refer to [email or web address], password [email or web address] to get updated schedule on who will round on this patient, as hospitalists switch teams weekly. If 7PM-7AM, please contact night-coverage at [email or web address], password [email or web address] for any overnight needs.  08/11/2018, 8:44 AM

## 2018-08-11 NOTE — H&P (Signed)
History and Physical    Kathy Phelps OJJ:[id number] DOB: [birth year] DOA: 08/10/2018  PCP: Patient, No Pcp Per  Patient coming from: Home  I have personally briefly reviewed patient's old medical records in [place]  Chief Complaint: Necrotic toes of foot  HPI: Kathy Phelps is a 37 y.o. female with medical history significant of DM2, BPD, prior L BKA.  Patient has been battling with ischemia of the RLE, has had revascularization which has failed.  Due to painful necrotic toes on R foot, Dr. Sharol Given had planned on surgical management, on 10/16.  Patient says she became scared and no-showed for surgery.  Unfortunately in the intervening time, pain and foot have only progressively gotten worse.  She now presents to ED and is open to surgical management.  She does state that she would want just ONE surgery, and so agrees to BKA as transmetatarsal amputation might not heal.   ED Course: Creat 2.6 up from 1.7 baseline.  WBC 15.5k.  Tm 99.6.  EDP started patient on zosyn / vanc.   Review of Systems: As per HPI otherwise 10 point review of systems negative.   Past Medical History:  Diagnosis Date  . Anxiety   . Asthma   . Bipolar affective ([place])   . Complete miscarriage   . Constipation   . 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 Depression   . Diabetes mellitus    Type II  . Dyspnea     " only at night and I stop breathing in my sleep too for about the last three weeks"  . GERD (gastroesophageal reflux disease)   . Headache    "migraraines"  . Hyperlipidemia   . Hypertension   . Osteomyelitis ([place])    left foot  . Pregnancy complication    HELP Syndrome  . Renal disorder   . Schizophrenia ([place])   . Stroke [facility])    " mild stroke", memory loss- approx 2017    Past Surgical History:  Procedure Laterality Date  . ABDOMINAL AORTOGRAM N/A 09/07/2017   Procedure: ABDOMINAL AORTOGRAM;  Surgeon: Waynetta Sandy, MD;  Location: [place] [facility];  Service: Cardiovascular;  Laterality: N/A;  . ABDOMINAL AORTOGRAM W/LOWER EXTREMITY N/A 02/03/2017   Procedure: Abdominal Aortogram w/Lower Extremity;  Surgeon: Waynetta Sandy, MD;  Location: [place] [facility];  Service: Cardiovascular;  Laterality: N/A;  . ABDOMINAL AORTOGRAM W/LOWER EXTREMITY N/A 06/27/2018   Procedure: ABDOMINAL AORTOGRAM W/LOWER EXTREMITY;  Surgeon: Serafina Mitchell, MD;  Location: [place] [facility];  Service: Cardiovascular;  Laterality: N/A;  . AMPUTATION Left 02/10/2017   Procedure: AMPUTATION TOES 3, 4 AND 5  LEFT FOOT;  Surgeon: Waynetta Sandy, MD;  Location: [place];  Service: Vascular;  Laterality: Left;  . AMPUTATION Left 12/21/2017   Procedure: LEFT FOOT 5TH RAY AMPUTATION;  Surgeon: Newt Minion, MD;  Location: [place];  Service: Orthopedics;  Laterality: Left;  . AMPUTATION Left 02/01/2018   Procedure: LEFT BELOW KNEE AMPUTATION;  Surgeon: Newt Minion, MD;  Location: [place];  Service: Orthopedics;  Laterality: Left;  . CESAREAN SECTION    . CHOLECYSTECTOMY    . LOWER EXTREMITY ANGIOGRAPHY Bilateral 09/07/2017   Procedure: Lower Extremity Angiography;  Surgeon: Waynetta Sandy, MD;  Location: [place] [facility];  Service: Cardiovascular;  Laterality: Bilateral;  . PERIPHERAL VASCULAR ATHERECTOMY  02/03/2017   Procedure: Peripheral Vascular Atherectomy;  Surgeon: Waynetta Sandy, MD;  Location: [place] [facility];  Service: Cardiovascular;;  . PERIPHERAL VASCULAR ATHERECTOMY Right 06/27/2018   Procedure: PERIPHERAL VASCULAR ATHERECTOMY;  Surgeon: Serafina Mitchell, MD;  Location: [place] [facility];  Service: Cardiovascular;  Laterality: Right;  superficial femoral  . PERIPHERAL VASCULAR BALLOON ANGIOPLASTY  02/03/2017   Procedure: Peripheral Vascular Balloon Angioplasty;  Surgeon: Waynetta Sandy, MD;  Location: [place] [facility];  Service: Cardiovascular;;  . PERIPHERAL VASCULAR INTERVENTION Left  09/07/2017   Procedure: PERIPHERAL VASCULAR INTERVENTION;  Surgeon: Waynetta Sandy, MD;  Location: [place] [facility];  Service: Cardiovascular;  Laterality: Left;  SFA/POPLITEAL  . STUMP REVISION Left 05/24/2018  . STUMP REVISION Left 05/24/2018   Procedure: REVISION LEFT BELOW KNEE AMPUTATION;  Surgeon: Newt Minion, MD;  Location: [place];  Service: Orthopedics;  Laterality: Left;  . TUBAL LIGATION       reports that she has been smoking cigarettes. She has a 28.00 pack-year smoking history. She has never used smokeless tobacco. She reports that she does not drink alcohol or use drugs.  Allergies  Allergen Reactions  . Hydrocodone-Acetaminophen Hives  . Tylenol [Acetaminophen] Itching and Swelling    Patient tolerated APAP 650 mg (06/29/18) as well as oxycodone/APAP 5-325 mg during 06/25/2018 admission.    Family History  Problem Relation Age of Onset  . Diabetes Mellitus II Mother   . Heart disease Mother   . Heart disease Father      Prior to Admission medications   Medication Sig Start Date End Date Taking? Authorizing Provider  ADVAIR DISKUS 100-50 MCG/DOSE AEPB Inhale 1 puff into the lungs daily. 01/10/15  Yes [provider]  alprazolam Duanne Moron) 2 MG tablet Take 1 tablet (2 mg total) by mouth 4 (four) times daily as needed for sleep. Patient taking differently: Take 2 mg by mouth 5 (five) times daily.  08/12/17  Yes Debbe Odea, MD  aspirin EC 81 MG tablet Take 1 tablet (81 mg total) by mouth daily. 07/03/18 07/03/19 Yes [name], MD  atorvastatin (LIPITOR) 80 MG tablet Take 1 tablet (80 mg total) by mouth daily at 6 PM. 07/03/18  Yes [name], MD  busPIRone (BUSPAR) 15 MG tablet Take 15 mg by mouth 3 (three) times daily. 06/09/18  Yes [provider]  cloNIDine (CATAPRES) 0.1 MG tablet Take 0.1 mg by mouth 2 (two) times daily. 05/12/18  Yes [provider]  clopidogrel (PLAVIX) 75 MG tablet Take 1 tablet (75 mg total) by mouth  daily with breakfast. 09/08/17  Yes Rhyne, Samantha J, PA-C  DULoxetine (CYMBALTA) 30 MG capsule Take 90 mg by mouth daily.  06/09/18  Yes [provider]  famotidine (PEPCID) 20 MG tablet Take 1 tablet (20 mg total) by mouth 2 (two) times daily. Patient taking differently: Take 20 mg by mouth daily.  08/12/17  Yes Debbe Odea, MD  gabapentin (NEURONTIN) 800 MG tablet Take 1 tablet (800 mg total) by mouth 2 (two) times daily. Patient taking differently: Take 800 mg by mouth 5 (five) times daily.  02/11/17  Yes Hosie Poisson, MD  insulin glargine (LANTUS) 100 UNIT/ML injection Inject 0.2-0.3 mLs (20-30 Units total) into the skin 2 (two) times daily. 20 U in the morning and 30 U in the evening Patient taking differently: Inject 30 Units into the skin 2 (two) times daily.  08/12/17  Yes Gherghe, Costin M, MD  LATUDA 120 MG TABS Take 120 mg by mouth at bedtime. 04/12/16  Yes [provider]  metFORMIN (  GLUCOPHAGE) 1000 MG tablet Take 1 tablet (1,000 mg total) by mouth 2 (two) times daily. Patient taking differently: Take 500 mg by mouth 2 (two) times daily with a meal.  07/16/14  Yes Pisciotta, Elmyra Ricks, PA-C  metoCLOPramide (REGLAN) 5 MG tablet Take 1 tablet (5 mg total) by mouth 3 (three) times daily before meals. 09/08/17  Yes Bonnielee Haff, MD  mirtazapine (REMERON) 15 MG tablet Take 15 mg by mouth at bedtime. 12/22/17  Yes [provider]  naproxen (NAPROSYN) 500 MG tablet Take 500 mg by mouth 2 (two) times daily. 05/12/18  Yes [provider]  nicotine (NICODERM CQ - DOSED IN MG/24 HOURS) 21 mg/24hr patch Place 1 patch (21 mg total) onto the skin daily. 02/12/17  Yes Hosie Poisson, MD  nitroGLYCERIN (NITRO-DUR) 0.2 mg/hr patch Place 1 patch (0.2 mg total) onto the skin daily. 01/16/18  Yes Newt Minion, MD  NOVOLOG 100 UNIT/ML injection Inject 0-30 Units into the skin 3 (three) times daily with meals. Sliding Scale:  >300 15 units < 300 do not use 08/12/17  Yes Gherghe,  Vella Redhead, MD  oxyCODONE-acetaminophen (PERCOCET/ROXICET) 5-325 MG tablet Take 1-2 tablets by mouth every 4 (four) hours as needed for severe pain. 07/21/18  Yes Rayburn, Neta Mends, PA-C  pantoprazole (PROTONIX) 40 MG tablet Take 1 tablet (40 mg total) by mouth daily. 02/11/17  Yes Hosie Poisson, MD  pentoxifylline (TRENTAL) 400 MG CR tablet Take 1 tablet (400 mg total) by mouth 3 (three) times daily with meals. 01/16/18  Yes Newt Minion, MD  PROAIR HFA 108 [phone] BASE) MCG/ACT inhaler Inhale 2 puffs into the lungs every 6 (six) hours as needed for shortness of breath.  01/10/15  Yes [provider]  silver sulfADIAZINE (SILVADENE) 1 % cream Apply 1 application topically daily. 04/20/18  Yes Dondra Prader R, NP  simvastatin (ZOCOR) 40 MG tablet Take 40 mg by mouth every evening.  04/25/16  Yes [provider]  zolpidem (AMBIEN) 10 MG tablet Take 10 mg by mouth at bedtime as needed for sleep. 04/26/16  Yes [provider]  Insulin Syringe-Needle U-100 (INSULIN SYRINGE .5CC/31GX5/16") 31G X 5/16" 0.5 ML MISC 1 each by Does not apply route QID. 08/12/17   Caren Griffins, MD    Physical Exam: Vitals:   08/11/18 0011 08/11/18 0015 08/11/18 0045 08/11/18 0100  BP: (!) 159/99 (!) 167/103 (!) 172/99 (!) 154/98  Pulse: 96 94 91 95  Resp: 16     Temp: 98.8 F (37.1 C)     TempSrc: Oral     SpO2: 100% 100% 100% 100%  Weight:      Height:        Constitutional: NAD, calm, comfortable Eyes: PERRL, lids and conjunctivae normal ENMT: Mucous membranes are moist. Posterior pharynx clear of any exudate or lesions.Normal dentition.  Neck: normal, supple, no masses, no thyromegaly Respiratory: clear to auscultation bilaterally, no wheezing, no crackles. Normal respiratory effort. No accessory muscle use.  Cardiovascular: Regular rate and rhythm, no murmurs / rubs / gallops. No extremity edema. 2+ pedal pulses. No carotid bruits.  Abdomen: no tenderness, no masses palpated. No  hepatosplenomegaly. Bowel sounds positive.  Musculoskeletal: no clubbing / cyanosis. No joint deformity upper and lower extremities. Good ROM, no contractures. Normal muscle tone.  Skin:    Neurologic: CN 2-12 grossly intact. Sensation intact, DTR normal. Strength 5/5 in all 4.  Psychiatric: Normal judgment and insight. Alert and oriented x 3. Normal mood.    Labs  on Admission: I have personally reviewed following labs and imaging studies  CBC: Recent Labs  Lab 08/10/18 2055  WBC 15.5*  NEUTROABS 12.0*  HGB 8.4*  HCT 27.4*  MCV 89.3  PLT 233*   Basic Metabolic Panel: Recent Labs  Lab 08/10/18 2055  NA 136  K 3.7  CL 102  CO2 19*  GLUCOSE 199*  BUN 20  CREATININE 2.62*  CALCIUM 9.2   GFR: Estimated Creatinine Clearance: 30.1 mL/min (A) (by C-G formula based on SCr of 2.62 mg/dL (H)). Liver Function Tests: Recent Labs  Lab 08/10/18 2055  AST 18  ALT 9  ALKPHOS 85  BILITOT 0.4  PROT 7.8  ALBUMIN 2.7*   Recent Labs  Lab 08/10/18 2055  LIPASE 27   No results for input(s): AMMONIA in the last 168 hours. Coagulation Profile: Recent Labs  Lab 08/10/18 2055  INR 1.08   Cardiac Enzymes: No results for input(s): CKTOTAL, CKMB, CKMBINDEX, TROPONINI in the last 168 hours. BNP (last 3 results) No results for input(s): PROBNP in the last 8760 hours. HbA1C: No results for input(s): HGBA1C in the last 72 hours. CBG: No results for input(s): GLUCAP in the last 168 hours. Lipid Profile: No results for input(s): CHOL, HDL, LDLCALC, TRIG, CHOLHDL, LDLDIRECT in the last 72 hours. Thyroid Function Tests: No results for input(s): TSH, T4TOTAL, FREET4, T3FREE, THYROIDAB in the last 72 hours. Anemia Panel: No results for input(s): VITAMINB12, FOLATE, FERRITIN, TIBC, IRON, RETICCTPCT in the last 72 hours. Urine analysis:    Component Value Date/Time   COLORURINE YELLOW 07/02/2018 [place] 07/02/2018 1227   LABSPEC 1.013 07/02/2018 1227   PHURINE  5.0 07/02/2018 1227   GLUCOSEU 50 (A) 07/02/2018 1227   HGBUR LARGE (A) 07/02/2018 1227   BILIRUBINUR NEGATIVE 07/02/2018 1227   KETONESUR NEGATIVE 07/02/2018 1227   PROTEINUR 30 (A) 07/02/2018 1227   UROBILINOGEN 1.0 02/26/2015 1152   NITRITE NEGATIVE 07/02/2018 1227   LEUKOCYTESUR NEGATIVE 07/02/2018 1227    Radiological Exams on Admission: Dg Foot Complete Right  Result Date: 08/10/2018 CLINICAL DATA:  Initial evaluation for acute pain and soreness, concern for infection. History of diabetes. EXAM: RIGHT FOOT COMPLETE - 3+ VIEW COMPARISON:  Prior radiograph from 08/03/2008. FINDINGS: Soft tissue irregularity with ulceration overlies the lateral aspect of the right forefoot at the level of the right fourth and fifth digits. Probable involvement of the right third digit as well. Findings concerning for possible soft tissue infection given provided history. No convincing osseous erosions or periosteal reaction to suggest acute osteomyelitis. No acute fracture or dislocation. Mild scattered degenerative changes noted about the foot and ankle. IMPRESSION: 1. Soft tissue irregularity with ulceration involving the right for foot, most evident at the right third through fifth digits. Findings concerning for soft tissue infection/cellulitis given provided history. No convincing radiographic evidence for active osteomyelitis at this time. 2. No other acute osseous abnormality. Electronically Signed   By: Jeannine Boga M.D.   On: 08/10/2018 22:07    EKG: Independently reviewed.  Assessment/Plan Principal Problem:   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[place]) Active Problems:   Type II diabetes mellitus with renal manifestations, uncontrolled ([place])   AKI (acute kidney injury) ([place])   Bipolar 1 disorder ([place])   CKD stage 3 secondary to diabetes ([place])    1. Diabetic gangrene of foot - now with small areas suspicious for wet gangrene at bases of toes, WBC elevation 1. Foot ulcer pathway 2. BCx  pending 3. CRP /  ESR pending 4. Cont empiric vanc 5. De escalate zosyn to rocephin / flagyl 6. Dilaudid for pain control 7. Call Dr. Sharol Given in AM - sounds like patient accepting of need for BKA. 8. CM and SW consults 2. AKI on CKD stage 3 - 1. IVF: NS at 100 cc/hr 2. Strict intake and output 3. Hold nephrotoxic NSAIDs 4. Renal US 5. Repeat BMP in AM 6. If not improving, call nephrology in AM. 3. DM2 - 1. Lantus 15u BID 2. Sensitive SSI Q4H 4. BPD - continue home meds  DVT prophylaxis: SCDs Code Status: Full Family Communication: Daughter at bedside Disposition Plan: TBD Consults called: Call Dr. Sharol Given in AM, Call nephro in AM Admission status: Admit to inpatient  Severity of Illness: The appropriate patient status for this patient is INPATIENT. Inpatient status is judged to be reasonable and necessary in order to provide the required intensity of service to ensure the patient's safety. The patient's presenting symptoms, physical exam findings, and initial radiographic and laboratory data in the context of their chronic comorbidities is felt to place them at high risk for further clinical deterioration. Furthermore, it is not anticipated that the patient will be medically stable for discharge from the hospital within 2 midnights of admission. The following factors support the patient status of inpatient.   " The patient's presenting symptoms include pain and necrosis of toes of R foot. " The worrisome physical exam findings include dry and wet gangrene of R foot. " The initial radiographic and laboratory data are worrisome because of WBC 15k. " The chronic co-morbidities include DM2, s/p BKA of L leg.   * I certify that at the point of admission it is my clinical judgment that the patient will require inpatient hospital care spanning beyond 2 midnights from the point of admission due to high intensity of service, high risk for further deterioration and high frequency of surveillance  required.Etta Quill DO [facility] Pager [phone] Only works nights!  If 7AM-7PM, please contact the primary day team physician taking care of patient  [email or web address] Password [email or web address]  08/11/2018, 1:31 AM

## 2018-08-11 NOTE — Consult Note (Addendum)
WOC consult requested for foot wounds.  Reviewed photos in the EMR;  pt has necrotic toes and Dr Sharol Given planned for amputation surgery on 10/16, which patient did not show for, according to the EMR. Topical treatment will not be effective to promote healing; please refer to ortho service for further plan of care.  Please re-consult if further assistance is needed.  Thank-you,  Julien Girt MSN, [place], [place], [place], [place]

## 2018-08-11 NOTE — Consult Note (Signed)
ORTHOPAEDIC CONSULTATION  REQUESTING PHYSICIAN: Charlynne Cousins, MD  Chief Complaint: osteomyelitis right foot  HPI: Kathy Phelps is a 37 y.o. female who presents with diabetic neuropathy and osteomyelitis and ulceration right foot  Past Medical History:  Diagnosis Date  . Anxiety   . Asthma   . Bipolar affective ([place])   . Complete miscarriage   . Constipation   . 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 Depression   . Diabetes mellitus    Type II  . Dyspnea     " only at night and I stop breathing in my sleep too for about the last three weeks"  . GERD (gastroesophageal reflux disease)   . Headache    "migraraines"  . Hyperlipidemia   . Hypertension   . Osteomyelitis ([place])    left foot  . Pregnancy complication    HELP Syndrome  . Renal disorder   . Schizophrenia ([place])   . Stroke [facility])    " mild stroke", memory loss- approx 2017   Past Surgical History:  Procedure Laterality Date  . ABDOMINAL AORTOGRAM N/A 09/07/2017   Procedure: ABDOMINAL AORTOGRAM;  Surgeon: Waynetta Sandy, MD;  Location: [place];  Service: Cardiovascular;  Laterality: N/A;  . ABDOMINAL AORTOGRAM W/LOWER EXTREMITY N/A 02/03/2017   Procedure: Abdominal Aortogram w/Lower Extremity;  Surgeon: Waynetta Sandy, MD;  Location: [place] [facility];  Service: Cardiovascular;  Laterality: N/A;  . ABDOMINAL AORTOGRAM W/LOWER EXTREMITY N/A 06/27/2018   Procedure: ABDOMINAL AORTOGRAM W/LOWER EXTREMITY;  Surgeon: Serafina Mitchell, MD;  Location: [place] [facility];  Service: Cardiovascular;  Laterality: N/A;  . AMPUTATION Left 02/10/2017   Procedure: AMPUTATION TOES 3, 4 AND 5  LEFT FOOT;  Surgeon: Waynetta Sandy, MD;  Location: [place];  Service: Vascular;  Laterality: Left;  . AMPUTATION Left 12/21/2017   Procedure: LEFT FOOT 5TH RAY AMPUTATION;  Surgeon: Newt Minion, MD;  Location: [place];  Service: Orthopedics;  Laterality: Left;  .  AMPUTATION Left 02/01/2018   Procedure: LEFT BELOW KNEE AMPUTATION;  Surgeon: Newt Minion, MD;  Location: [place];  Service: Orthopedics;  Laterality: Left;  . CESAREAN SECTION    . CHOLECYSTECTOMY    . LOWER EXTREMITY ANGIOGRAPHY Bilateral 09/07/2017   Procedure: Lower Extremity Angiography;  Surgeon: Waynetta Sandy, MD;  Location: [place] [facility];  Service: Cardiovascular;  Laterality: Bilateral;  . PERIPHERAL VASCULAR ATHERECTOMY  02/03/2017   Procedure: Peripheral Vascular Atherectomy;  Surgeon: Waynetta Sandy, MD;  Location: [facility];  Service: Cardiovascular;;  . PERIPHERAL VASCULAR ATHERECTOMY Right 06/27/2018   Procedure: PERIPHERAL VASCULAR ATHERECTOMY;  Surgeon: Serafina Mitchell, MD;  Location: [facility];  Service: Cardiovascular;  Laterality: Right;  superficial femoral  . PERIPHERAL VASCULAR BALLOON ANGIOPLASTY  02/03/2017   Procedure: Peripheral Vascular Balloon Angioplasty;  Surgeon: Waynetta Sandy, MD;  Location: [facility];  Service: Cardiovascular;;  . PERIPHERAL VASCULAR INTERVENTION Left 09/07/2017   Procedure: PERIPHERAL VASCULAR INTERVENTION;  Surgeon: Waynetta Sandy, MD;  Location: [facility];  Service: Cardiovascular;  Laterality: Left;  SFA/POPLITEAL  . STUMP REVISION Left 05/24/2018  . STUMP REVISION Left 05/24/2018   Procedure: REVISION LEFT BELOW KNEE AMPUTATION;  Surgeon: Newt Minion, MD;  Location: [facility];  Service: Orthopedics;  Laterality: Left;  . TUBAL LIGATION     Social History   Socioeconomic History  . Marital status: Single  Spouse name: Not on file  . Number of children: Not on file  . Years of education: Not on file  . Highest education level: Not on file  Occupational History  . Not on file  Social Needs  . Financial resource strain: Not on file  . Food insecurity:    Worry: Not on file    Inability: Not on file  . Transportation needs:    Medical: Not on file     Non-medical: Not on file  Tobacco Use  . Smoking status: Current Every Day Smoker    Packs/day: 1.00    Years: 28.00    Pack years: 28.00    Types: Cigarettes  . Smokeless tobacco: Never Used  Substance and Sexual Activity  . Alcohol use: No  . Drug use: No  . Sexual activity: Yes    Birth control/protection: None  Lifestyle  . Physical activity:    Days per week: Not on file    Minutes per session: Not on file  . Stress: Not on file  Relationships  . Social connections:    Talks on phone: Not on file    Gets together: Not on file    Attends religious service: Not on file    Active member of club or organization: Not on file    Attends meetings of clubs or organizations: Not on file    Relationship status: Not on file  Other Topics Concern  . Not on file  Social History Narrative  . Not on file   Family History  Problem Relation Age of Onset  . Diabetes Mellitus II Mother   . Heart disease Mother   . Heart disease Father    - negative except otherwise stated in the family history section Allergies  Allergen Reactions  . Hydrocodone-Acetaminophen Hives  . Tylenol [Acetaminophen] Itching and Swelling    Patient tolerated APAP 650 mg (06/29/18) as well as oxycodone/APAP 5-325 mg during 06/25/2018 admission.   Prior to Admission medications   Medication Sig Start Date End Date Taking? Authorizing Provider  ADVAIR DISKUS 100-50 MCG/DOSE AEPB Inhale 1 puff into the lungs daily. 01/10/15  Yes [provider]  alprazolam Duanne Moron) 2 MG tablet Take 1 tablet (2 mg total) by mouth 4 (four) times daily as needed for sleep. Patient taking differently: Take 2 mg by mouth 5 (five) times daily.  08/12/17  Yes Debbe Odea, MD  aspirin EC 81 MG tablet Take 1 tablet (81 mg total) by mouth daily. 07/03/18 07/03/19 Yes [name], MD  atorvastatin (LIPITOR) 80 MG tablet Take 1 tablet (80 mg total) by mouth daily at 6 PM. 07/03/18  Yes [name], MD  busPIRone (BUSPAR)  15 MG tablet Take 15 mg by mouth 3 (three) times daily. 06/09/18  Yes [provider]  cloNIDine (CATAPRES) 0.1 MG tablet Take 0.1 mg by mouth 2 (two) times daily. 05/12/18  Yes [provider]  clopidogrel (PLAVIX) 75 MG tablet Take 1 tablet (75 mg total) by mouth daily with breakfast. 09/08/17  Yes Rhyne, Samantha J, PA-C  DULoxetine (CYMBALTA) 30 MG capsule Take 90 mg by mouth daily.  06/09/18  Yes [provider]  famotidine (PEPCID) 20 MG tablet Take 1 tablet (20 mg total) by mouth 2 (two) times daily. Patient taking differently: Take 20 mg by mouth daily.  08/12/17  Yes Debbe Odea, MD  gabapentin (NEURONTIN) 800 MG tablet Take 1 tablet (800 mg total) by mouth 2 (two) times daily. Patient taking differently: Take 800  mg by mouth 5 (five) times daily.  02/11/17  Yes Hosie Poisson, MD  insulin glargine (LANTUS) 100 UNIT/ML injection Inject 0.2-0.3 mLs (20-30 Units total) into the skin 2 (two) times daily. 20 U in the morning and 30 U in the evening Patient taking differently: Inject 30 Units into the skin 2 (two) times daily.  08/12/17  Yes Gherghe, Costin M, MD  LATUDA 120 MG TABS Take 120 mg by mouth at bedtime. 04/12/16  Yes [provider]  metFORMIN (GLUCOPHAGE) 1000 MG tablet Take 1 tablet (1,000 mg total) by mouth 2 (two) times daily. Patient taking differently: Take 500 mg by mouth 2 (two) times daily with a meal.  07/16/14  Yes Pisciotta, Elmyra Ricks, PA-C  metoCLOPramide (REGLAN) 5 MG tablet Take 1 tablet (5 mg total) by mouth 3 (three) times daily before meals. 09/08/17  Yes Bonnielee Haff, MD  mirtazapine (REMERON) 15 MG tablet Take 15 mg by mouth at bedtime. 12/22/17  Yes [provider]  naproxen (NAPROSYN) 500 MG tablet Take 500 mg by mouth 2 (two) times daily. 05/12/18  Yes [provider]  nicotine (NICODERM CQ - DOSED IN MG/24 HOURS) 21 mg/24hr patch Place 1 patch (21 mg total) onto the skin daily. 02/12/17  Yes Hosie Poisson, MD    nitroGLYCERIN (NITRO-DUR) 0.2 mg/hr patch Place 1 patch (0.2 mg total) onto the skin daily. 01/16/18  Yes Newt Minion, MD  NOVOLOG 100 UNIT/ML injection Inject 0-30 Units into the skin 3 (three) times daily with meals. Sliding Scale:  >300 15 units < 300 do not use 08/12/17  Yes Gherghe, Vella Redhead, MD  oxyCODONE-acetaminophen (PERCOCET/ROXICET) 5-325 MG tablet Take 1-2 tablets by mouth every 4 (four) hours as needed for severe pain. 07/21/18  Yes Rayburn, Neta Mends, PA-C  pantoprazole (PROTONIX) 40 MG tablet Take 1 tablet (40 mg total) by mouth daily. 02/11/17  Yes Hosie Poisson, MD  pentoxifylline (TRENTAL) 400 MG CR tablet Take 1 tablet (400 mg total) by mouth 3 (three) times daily with meals. 01/16/18  Yes Newt Minion, MD  PROAIR HFA 108 [phone] BASE) MCG/ACT inhaler Inhale 2 puffs into the lungs every 6 (six) hours as needed for shortness of breath.  01/10/15  Yes [provider]  silver sulfADIAZINE (SILVADENE) 1 % cream Apply 1 application topically daily. 04/20/18  Yes Dondra Prader R, NP  simvastatin (ZOCOR) 40 MG tablet Take 40 mg by mouth every evening.  04/25/16  Yes [provider]  zolpidem (AMBIEN) 10 MG tablet Take 10 mg by mouth at bedtime as needed for sleep. 04/26/16  Yes [provider]  Insulin Syringe-Needle U-100 (INSULIN SYRINGE .5CC/31GX5/16") 31G X 5/16" 0.5 ML MISC 1 each by Does not apply route QID. 08/12/17   Caren Griffins, MD   US Renal  Result Date: 08/11/2018 CLINICAL DATA:  37 year old female with acute renal insufficiency. EXAM: RENAL / URINARY TRACT ULTRASOUND COMPLETE COMPARISON:  Abdominal CT dated 06/25/2018 FINDINGS: Right Kidney: Renal measurements: 11.5 x 4.6 x 5.1 cm = volume: 145.8 mL. Normal echogenicity. No hydronephrosis or shadowing stone. Left Kidney: Renal measurements: 12.4 x 5.3 x 5.5 cm = volume: 193.3 mL. Normal echogenicity. No hydronephrosis or shadowing stone. Bladder: Appears normal for degree of bladder distention.  IMPRESSION: Unremarkable renal ultrasound. Electronically Signed   By: Anner Crete M.D.   On: 08/11/2018 03:54   Dg Foot Complete Right  Result Date: 08/10/2018 CLINICAL DATA:  Initial evaluation for acute pain and soreness, concern for infection. History of  diabetes. EXAM: RIGHT FOOT COMPLETE - 3+ VIEW COMPARISON:  Prior radiograph from 08/03/2008. FINDINGS: Soft tissue irregularity with ulceration overlies the lateral aspect of the right forefoot at the level of the right fourth and fifth digits. Probable involvement of the right third digit as well. Findings concerning for possible soft tissue infection given provided history. No convincing osseous erosions or periosteal reaction to suggest acute osteomyelitis. No acute fracture or dislocation. Mild scattered degenerative changes noted about the foot and ankle. IMPRESSION: 1. Soft tissue irregularity with ulceration involving the right for foot, most evident at the right third through fifth digits. Findings concerning for soft tissue infection/cellulitis given provided history. No convincing radiographic evidence for active osteomyelitis at this time. 2. No other acute osseous abnormality. Electronically Signed   By: Jeannine Boga M.D.   On: 08/10/2018 22:07   - pertinent xrays, CT, MRI studies were reviewed and independently interpreted  Positive ROS: All other systems have been reviewed and were otherwise negative with the exception of those mentioned in the HPI and as above.  Physical Exam: General: Alert, no acute distress Psychiatric: Patient is competent for consent with normal mood and affect Lymphatic: No axillary or cervical lymphadenopathy Cardiovascular: No pedal edema Respiratory: No cyanosis, no use of accessory musculature GI: No organomegaly, abdomen is soft and non-tender    Images:  @ENCIMAGES @  Labs:  Lab Results  Component Value Date   HGBA1C 8.0 (H) 08/11/2018   HGBA1C 8.0 (H) 05/24/2018   HGBA1C 9.1  (H) 02/01/2018   ESRSEDRATE 128 (H) 08/11/2018   ESRSEDRATE 63 (H) 12/13/2017   ESRSEDRATE 72 (H) 09/02/2017   CRP 7.0 (H) 08/11/2018   CRP 3.5 (H) 09/02/2017   REPTSTATUS 06/30/2018 FINAL 06/25/2018   GRAMSTAIN  04/25/2018    FEW WBC PRESENT,BOTH PMN AND MONONUCLEAR FEW GRAM POSITIVE COCCI RARE GRAM POSITIVE RODS Performed at [facility], [place] [address], [place], [place] [address]    CULT  06/25/2018    NO GROWTH 5 DAYS Performed at [facility] [address] [place], [place] [address]    [place] 04/25/2018   LABORGA STAPHYLOCOCCUS AUREUS 04/25/2018    Lab Results  Component Value Date   ALBUMIN 2.7 (L) 08/10/2018   ALBUMIN 3.0 (L) 06/25/2018   ALBUMIN 2.1 (L) 09/07/2017   PREALBUMIN 11.0 (L) 08/11/2018   PREALBUMIN 15.3 (L) 09/02/2017    Neurologic: Patient does not have protective sensation bilateral lower extremities.   MUSCULOSKELETAL:   Skin: ulceration right foot  Good dorsalis pulse, osteomyelitis right foot, left BKA  Assessment: Osteomyelitis right foot  Plan: Right below knee amputation  Thank you for the consult and the opportunity to see Ms. Lilli Light, MD [facility] [phone] 6:51 AM

## 2018-08-11 NOTE — ED Notes (Signed)
Hospitalist at bedside 

## 2018-08-11 NOTE — Anesthesia Preprocedure Evaluation (Addendum)
Anesthesia Evaluation  Patient identified by MRN, date of birth, ID band Patient awake    Reviewed: Allergy & Precautions, NPO status , Patient's Chart, lab work & pertinent test results  Airway Mallampati: III  TM Distance: >3 FB Neck ROM: Full    Dental  (+) Poor Dentition, Missing, Loose, Dental Advisory Given,    Pulmonary asthma , Current Smoker,    breath sounds clear to auscultation       Cardiovascular hypertension, negative cardio ROS   Rhythm:Regular Rate:Normal  TTE 01/2017 - Left ventricle: The cavity size was normal. Wall thickness was normal. Systolic function was normal. The estimated ejection fraction was in the range of 50% to 55%. Wall motion was normal,  there were no regional wall motion abnormalities. Features are consistent with a pseudonormal left ventricular filling pattern, with concomitant abnormal relaxation and increased filling pressure (grade 2 diastolic dysfunction).    Neuro/Psych  Headaches, PSYCHIATRIC DISORDERS Anxiety Depression Bipolar Disorder Schizophrenia CVA, No Residual Symptoms    GI/Hepatic Neg liver ROS, GERD  Medicated,  Endo/Other  negative endocrine ROSdiabetes, Type 2, Insulin Dependent  Renal/GU negative Renal ROS  negative genitourinary   Musculoskeletal negative musculoskeletal ROS (+)   Abdominal   Peds  Hematology negative hematology ROS (+)   Anesthesia Other Findings osteomyelitis  On plavix, does not remember timing of last dose  Reproductive/Obstetrics                            Anesthesia Physical Anesthesia Plan  ASA: III  Anesthesia Plan: General   Post-op Pain Management:    Induction: Intravenous  PONV Risk Score and Plan: 2  Airway Management Planned: LMA  Additional Equipment:   Intra-op Plan:   Post-operative Plan: Extubation in OR  Informed Consent: I have reviewed the patients History and Physical, chart, labs  and discussed the procedure including the risks, benefits and alternatives for the proposed anesthesia with the patient or authorized representative who has indicated his/her understanding and acceptance.   Dental advisory given  Plan Discussed with: CRNA  Anesthesia Plan Comments:         Anesthesia Quick Evaluation

## 2018-08-12 LAB — GLUCOSE, CAPILLARY
GLUCOSE-CAPILLARY: 128 mg/dL — AB (ref 70–99)
GLUCOSE-CAPILLARY: 86 mg/dL (ref 70–99)
GLUCOSE-CAPILLARY: 91 mg/dL (ref 70–99)
GLUCOSE-CAPILLARY: 76 mg/dL (ref 70–99)
GLUCOSE-CAPILLARY: 70 mg/dL (ref 70–99)
GLUCOSE-CAPILLARY: 70 mg/dL (ref 70–99)
Glucose-Capillary: 119 mg/dL — ABNORMAL HIGH (ref 70–99)

## 2018-08-12 NOTE — Progress Notes (Signed)
Pt  CBG is 76. Pt is asymptomatic and refusing to eat PO. Educated on dangers of hypoglycemia. Offered juice and long lasting carbs and refuses. Will continue to monitor.

## 2018-08-12 NOTE — Progress Notes (Signed)
Pt continues to refuse medications and limb stump shrinker about much encouragement and education. Will continue to encourage and educate.

## 2018-08-12 NOTE — Progress Notes (Signed)
Pt has one episode of green liquid emesis. Denies abdominal pain. Abdomen is soft, non-distended, and non-tender. States last BM was yesterday. Given zofran. Refusing a.m meds. States she doesn't take meds in the morning. Requested reconciliation of meds to match home meds from pharmacy. Will continue to monitor pt.

## 2018-08-12 NOTE — Anesthesia Postprocedure Evaluation (Signed)
Anesthesia Post Note  Patient: Kathy Phelps  Procedure(s) Performed: AMPUTATION BELOW KNEE (Right Leg Lower)     Patient location during evaluation: PACU Anesthesia Type: General Level of consciousness: awake and alert Pain management: pain level controlled Vital Signs Assessment: post-procedure vital signs reviewed and stable Respiratory status: spontaneous breathing, nonlabored ventilation, respiratory function stable and patient connected to nasal cannula oxygen Cardiovascular status: blood pressure returned to baseline and stable Postop Assessment: no apparent nausea or vomiting Anesthetic complications: no    Last Vitals:  Vitals:   08/12/18 0528 08/12/18 0826  BP: (!) 180/94 (!) 181/101  Pulse: 82 100  Resp:    Temp:  36.4 C  SpO2:  100%    Last Pain:  Vitals:   08/12/18 0826  TempSrc: Oral  PainSc: 8                  Anastasio Wogan L Lynix Bonine

## 2018-08-12 NOTE — Progress Notes (Signed)
Patient's CBG 70. Educated pt on importance of eating to keep blood sugar up. Pt asymptomatic. Gave pt another education on dangers of hypoglycemia,and importance of eating meals. Given popcicle and sat at bedside. Patient stated she would try to eat it.  Requested family at bedside to encourage pt for safety sake. Pt continues to refuse.

## 2018-08-12 NOTE — Progress Notes (Signed)
Patient ID: Kathy Phelps, female   DOB: 1981/07/12, 37 y.o.   MRN: [id number] Patient is postoperative day 1 transtibial amputation.  The wound VAC is functioning well there is no drainage.  Patient states she still has the nausea and vomiting that she had prior to surgery.  She complains of green vomiting.  Patient states she cannot go to skilled nursing she has children at home and will need to discharge home.  Patient will need to be independent with transfers for discharge to home.

## 2018-08-12 NOTE — Progress Notes (Signed)
Pt is lying on a bed pan. Asked pt if she is done and she states she is going to stay on it just in case she needs to have a BM. Warned that this may cause a skin break down and pt closed her eyes and did not respond. Asked pt if she heard this writer's statement about bed pan and pt acknowledged.

## 2018-08-12 NOTE — Progress Notes (Signed)
PT Cancellation Note  Patient Details Name: Kathy Phelps MRN: [id number] DOB: [birth year]   Cancelled Treatment:    Reason Eval/Treat Not Completed: Other (comment).  Pt has limited ability to work with PT due to mult conflicts including using bedpan, having pain, being asleep and not wanting to be interrupted as well as being with other staff for care.  Her plan is to reattempt tomorrow as pt is telling MD that she must go directly home due to family constraints.  Will need to complete an independent transfer to make this transition.   Ramond Dial 08/12/2018, 3:15 PM   Mee Hives, PT MS Acute Rehab Dept. Number: [place] [facility] [place]

## 2018-08-12 NOTE — Progress Notes (Signed)
PROGRESS NOTE    Kathy Phelps  MCE:[id number] DOB: 02/07/1981 DOA: 08/10/2018 PCP: Patient, No Pcp Per    Brief Narrative:  37 y.o. 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 is been on the going treatment of her lower extremity foot ischemia has had really failed revascularization comes in due to necrotic painful toe, she is scheduled for surgery on 07/26/2018, where she was no-show, she relates after this her pain just progressively got worst  Assessment & Plan:   Principal Problem:   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[place]) Active Problems:   Type II diabetes mellitus with renal manifestations, uncontrolled ([place])   Essential hypertension   AKI (acute kidney injury) ([place])   Bipolar 1 disorder ([place])   Hx of BKA, left ([place])   CKD stage 3 secondary to diabetes ([place])   Uncontrolled diabetes mellitus type 2 with peripheral artery disease ([place])   Gangrene of right foot ([place])  Diabetic wet gangrene of the foot /  Uncontrolled diabetes mellitus type 2 with peripheral artery disease/ Type II diabetes mellitus with renal manifestations, uncontrolled ([place]) She was started empirically on IV vancomycin, Rocephin and Flagyl. ESR 28 Blood cultures remain negative till date Followed by Orthopedic Surgery, pt s/p transtibial amputation 11/1 of RLE Seen by PT. Will need SNF. Concerns that patient is incapable for tending to ADLS and also unable to transfer, thus discharge home at this time would be unsafe. Will discuss with SW  Essential hypertension: Suboptimally controlled Continue with hydralazine as needed  AKI (acute kidney injury) ([place]) on CKD stage 3 secondary to diabetes [facility]): -With a baseline creatinine of 1.7-2.0. -Likely prerenal etiology in the setting of infectious etiology and NSAID use. -Have discontinued NSAIDs continued IV fluids. -Renal ultrasound was normal. -Cr overall stable at 2.60.  -Repeat bmet in AM  Bipolar 1 disorder  ([place]): -Seems to be well controlled continue current medication.  Hx of BKA, left ([place])  -Stable  DVT prophylaxis: SCD's Code Status: Full Family Communication: Pt in room, family at bedside Disposition Plan: Uncertain at this time  Consultants:   Orthopedic Surgery  Procedures:   RLE amputation 11/1  Antimicrobials: Anti-infectives (From admission, onward)   Start     Dose/Rate Route Frequency Ordered Stop   08/12/18 0600  vancomycin (VANCOCIN) IVPB 750 mg/150 ml premix     750 mg 150 mL/hr over 60 Minutes Intravenous Every 24 hours 08/11/18 0142 08/12/18 0608   08/11/18 1000  cefTRIAXone (ROCEPHIN) 2 g in sodium chloride 0.9 % 100 mL IVPB     2 g 200 mL/hr over 30 Minutes Intravenous Every 24 hours 08/11/18 0121 08/12/18 2359   08/11/18 0800  metroNIDAZOLE (FLAGYL) IVPB 500 mg     500 mg 100 mL/hr over 60 Minutes Intravenous Every 8 hours 08/11/18 0121 08/12/18 2359   08/11/18 0145  vancomycin (VANCOCIN) 1,500 mg in sodium chloride 0.9 % 500 mL IVPB     1,500 mg 250 mL/hr over 120 Minutes Intravenous  Once 08/11/18 0131 08/11/18 0432   08/11/18 0130  metroNIDAZOLE (FLAGYL) IVPB 500 mg  Status:  Discontinued     500 mg 100 mL/hr over 60 Minutes Intravenous Every 8 hours 08/11/18 0121 08/11/18 0121   08/11/18 0030  vancomycin (VANCOCIN) IVPB 1000 mg/200 mL premix  Status:  Discontinued     1,000 mg 200 mL/hr over 60 Minutes Intravenous  Once 08/11/18 0025 08/11/18 0130   08/11/18 0030  piperacillin-tazobactam (ZOSYN) IVPB 3.375  g     3.375 g 100 mL/hr over 30 Minutes Intravenous  Once 08/11/18 0025 08/11/18 0133       Subjective: Without complaints this AM. Still wanting to go home instead of SNF  Objective: Vitals:   08/12/18 0457 08/12/18 0528 08/12/18 0826 08/12/18 1135  BP: (!) 170/105 (!) 180/94 (!) 181/101 (!) 175/101  Pulse: 95 82 100 94  Resp: 17   14  Temp: 98.3 F (36.8 C)  97.6 F (36.4 C) 98.5 F (36.9 C)  TempSrc: Oral  Oral Oral  SpO2: 100%   100% 97%  Weight: 74.4 kg     Height:        Intake/Output Summary (Last 24 hours) at 08/12/2018 1549 Last data filed at 08/12/2018 0508 Gross per 24 hour  Intake 770.45 ml  Output -  Net 770.45 ml   Filed Weights   08/10/18 2041 08/11/18 0352 08/12/18 0457  Weight: 72.6 kg 74.3 kg 74.4 kg    Examination:  General exam: Appears calm and comfortable  Respiratory system: Clear to auscultation. Respiratory effort normal. Cardiovascular system: S1 & S2 heard, RRR Gastrointestinal system: Abdomen is nondistended, soft and nontender. No organomegaly or masses felt. Normal bowel sounds heard. Central nervous system: Alert and oriented. No focal neurological deficits Skin: No rashes, lesions Psychiatry: Judgement and insight appear normal. Mood & affect appropriate.   Data Reviewed: I have personally reviewed following labs and imaging studies  CBC: Recent Labs  Lab 08/10/18 2055 08/11/18 0212  WBC 15.5* 14.3*  NEUTROABS 12.0*  --   HGB 8.4* 7.6*  HCT 27.4* 24.2*  MCV 89.3 89.6  PLT 412* 923   Basic Metabolic Panel: Recent Labs  Lab 08/10/18 2055 08/11/18 0212  NA 136 137  K 3.7 3.6  CL 102 105  CO2 19* 21*  GLUCOSE 199* 160*  BUN 20 20  CREATININE 2.62* 2.60*  CALCIUM 9.2 8.2*   GFR: Estimated Creatinine Clearance: 30.4 mL/min (A) (by C-G formula based on SCr of 2.6 mg/dL (H)). Liver Function Tests: Recent Labs  Lab 08/10/18 2055  AST 18  ALT 9  ALKPHOS 85  BILITOT 0.4  PROT 7.8  ALBUMIN 2.7*   Recent Labs  Lab 08/10/18 2055  LIPASE 27   No results for input(s): AMMONIA in the last 168 hours. Coagulation Profile: Recent Labs  Lab 08/10/18 2055  INR 1.08   Cardiac Enzymes: No results for input(s): CKTOTAL, CKMB, CKMBINDEX, TROPONINI in the last 168 hours. BNP (last 3 results) No results for input(s): PROBNP in the last 8760 hours. HbA1C: Recent Labs    08/11/18 0212  HGBA1C 8.0*   CBG: Recent Labs  Lab 08/11/18 1933 08/12/18 0032  08/12/18 0453 08/12/18 0824 08/12/18 1134  GLUCAP 159* 119* 128* 86 91   Lipid Profile: No results for input(s): CHOL, HDL, LDLCALC, TRIG, CHOLHDL, LDLDIRECT in the last 72 hours. Thyroid Function Tests: No results for input(s): TSH, T4TOTAL, FREET4, T3FREE, THYROIDAB in the last 72 hours. Anemia Panel: No results for input(s): VITAMINB12, FOLATE, FERRITIN, TIBC, IRON, RETICCTPCT in the last 72 hours. Sepsis Labs: Recent Labs  Lab 08/10/18 2115 08/11/18 0048  LATICACIDVEN 1.47 0.69    Recent Results (from the past 240 hour(s))  Culture, blood (Routine x 2)     Status: None (Preliminary result)   Collection Time: 08/10/18  8:55 PM  Result Value Ref Range Status   Specimen Description BLOOD RIGHT ANTECUBITAL  Final   Special Requests   Final    BOTTLES  DRAWN AEROBIC AND ANAEROBIC Blood Culture results may not be optimal due to an excessive volume of blood received in culture bottles   Culture   Final    NO GROWTH 2 DAYS Performed at [facility] [address], [place], [place] [address]    Report Status PENDING  Incomplete  Culture, blood (Routine x 2)     Status: None (Preliminary result)   Collection Time: 08/11/18 12:40 AM  Result Value Ref Range Status   Specimen Description BLOOD LEFT ANTECUBITAL  Final   Special Requests   Final    BOTTLES DRAWN AEROBIC AND ANAEROBIC Blood Culture adequate volume   Culture   Final    NO GROWTH 1 DAY Performed at [facility], [place] [address], [place], [place] [address]    Report Status PENDING  Incomplete  Surgical pcr screen     Status: None   Collection Time: 08/11/18  8:44 AM  Result Value Ref Range Status   MRSA, PCR NEGATIVE NEGATIVE Final   Staphylococcus aureus NEGATIVE NEGATIVE Final    Comment: (NOTE) The Xpert SA Assay (FDA approved for NASAL specimens in patients 53 years of age and older), is one component of a comprehensive surveillance program. It is not intended to diagnose infection nor to guide or  monitor treatment. Performed at [facility], [place] [address] [place], [place] [address]      Radiology Studies: US Renal  Result Date: 08/11/2018 CLINICAL DATA:  37 year old female with acute renal insufficiency. EXAM: RENAL / URINARY TRACT ULTRASOUND COMPLETE COMPARISON:  Abdominal CT dated 06/25/2018 FINDINGS: Right Kidney: Renal measurements: 11.5 x 4.6 x 5.1 cm = volume: 145.8 mL. Normal echogenicity. No hydronephrosis or shadowing stone. Left Kidney: Renal measurements: 12.4 x 5.3 x 5.5 cm = volume: 193.3 mL. Normal echogenicity. No hydronephrosis or shadowing stone. Bladder: Appears normal for degree of bladder distention. IMPRESSION: Unremarkable renal ultrasound. Electronically Signed   By: Anner Crete M.D.   On: 08/11/2018 03:54   Dg Foot Complete Right  Result Date: 08/10/2018 CLINICAL DATA:  Initial evaluation for acute pain and soreness, concern for infection. History of diabetes. EXAM: RIGHT FOOT COMPLETE - 3+ VIEW COMPARISON:  Prior radiograph from 08/03/2008. FINDINGS: Soft tissue irregularity with ulceration overlies the lateral aspect of the right forefoot at the level of the right fourth and fifth digits. Probable involvement of the right third digit as well. Findings concerning for possible soft tissue infection given provided history. No convincing osseous erosions or periosteal reaction to suggest acute osteomyelitis. No acute fracture or dislocation. Mild scattered degenerative changes noted about the foot and ankle. IMPRESSION: 1. Soft tissue irregularity with ulceration involving the right for foot, most evident at the right third through fifth digits. Findings concerning for soft tissue infection/cellulitis given provided history. No convincing radiographic evidence for active osteomyelitis at this time. 2. No other acute osseous abnormality. Electronically Signed   By: Jeannine Boga M.D.   On: 08/10/2018 22:07    Scheduled Meds: . atorvastatin  80 mg  Oral q1800  . busPIRone  15 mg Oral TID  . cloNIDine  0.1 mg Oral BID  . docusate sodium  100 mg Oral BID  . DULoxetine  90 mg Oral Daily  . feeding supplement (PRO-STAT SUGAR FREE 64)  30 mL Oral BID  . fluticasone furoate-vilanterol  1 puff Inhalation Daily  . gabapentin  800 mg Oral BID  . insulin aspart  0-9 Units Subcutaneous Q4H  . insulin  glargine  15 Units Subcutaneous BID  . lurasidone  120 mg Oral QHS  . metoCLOPramide  5 mg Oral TID AC  . mirtazapine  15 mg Oral QHS  . multivitamin with minerals  1 tablet Oral Daily  . nicotine  21 mg Transdermal Daily  . pantoprazole  40 mg Oral Daily  . pentoxifylline  400 mg Oral TID WC   Continuous Infusions: . sodium chloride 100 mL/hr at 08/11/18 0350  . sodium chloride 20 mL/hr at 08/11/18 1614  . cefTRIAXone (ROCEPHIN)  IV 2 g (08/12/18 1539)  . methocarbamol (ROBAXIN) IV    . metronidazole 500 mg (08/12/18 0917)     LOS: 1 day   Marylu Lund, MD [facility] Pager On Amion  If 7PM-7AM, please contact night-coverage 08/12/2018, 3:49 PM

## 2018-08-12 NOTE — Progress Notes (Signed)
Notified MD of pt's non-adherance to treatment plan throughout the day. Instructed to continue encouraging and educating pt. Gave pt another education on dangers of hypoglycemia, action time of lantus, and importance of eating meals. Given PO orange juice and sat at bedside. Requested family at bedside to encourage pt for safety sake. Pt continues to refuse.

## 2018-08-12 NOTE — Progress Notes (Signed)
PT Cancellation Note  Patient Details Name: Kathy Phelps MRN: [id number] DOB: 1980/11/04   Cancelled Treatment:    Reason Eval/Treat Not Completed: Other (comment).  Talked about PLOF with pt who reports she was mainly staying in bed at home, had depends garments and would not try to transfer to wc.  Her routine was to use the garments until children came home and then had them change her.  Pt is not willing to try to sit up this AM so will try again at another time to see how she manages functionally.   Ramond Dial 08/12/2018, 9:44 AM   Mee Hives, PT MS Acute Rehab Dept. Number: [place] [facility] [place]

## 2018-08-13 ENCOUNTER — Encounter (HOSPITAL_COMMUNITY): Payer: Self-pay | Admitting: Orthopedic Surgery

## 2018-08-13 ENCOUNTER — Inpatient Hospital Stay (HOSPITAL_COMMUNITY): Payer: Medicaid Other

## 2018-08-13 LAB — GLUCOSE, CAPILLARY
GLUCOSE-CAPILLARY: 121 mg/dL — AB (ref 70–99)
GLUCOSE-CAPILLARY: 79 mg/dL (ref 70–99)
GLUCOSE-CAPILLARY: 127 mg/dL — AB (ref 70–99)
GLUCOSE-CAPILLARY: 173 mg/dL — AB (ref 70–99)
Glucose-Capillary: 143 mg/dL — ABNORMAL HIGH (ref 70–99)
Glucose-Capillary: 62 mg/dL — ABNORMAL LOW (ref 70–99)
Glucose-Capillary: 81 mg/dL (ref 70–99)
Glucose-Capillary: 81 mg/dL (ref 70–99)

## 2018-08-13 LAB — CBC
HCT: 22.7 % — ABNORMAL LOW (ref 36.0–46.0)
Hemoglobin: 7.1 g/dL — ABNORMAL LOW (ref 12.0–15.0)
MCH: 28.1 pg (ref 26.0–34.0)
MCHC: 31.3 g/dL (ref 30.0–36.0)
MCV: 89.7 fL (ref 80.0–100.0)
Platelets: 292 10*3/uL (ref 150–400)
RBC: 2.53 MIL/uL — ABNORMAL LOW (ref 3.87–5.11)
RDW: 12 % (ref 11.5–15.5)
WBC: 11.6 10*3/uL — AB (ref 4.0–10.5)
nRBC: 0 % (ref 0.0–0.2)

## 2018-08-13 LAB — BASIC METABOLIC PANEL
Anion gap: 4 — ABNORMAL LOW (ref 5–15)
BUN: 12 mg/dL (ref 6–20)
CO2: 23 mmol/L (ref 22–32)
CREATININE: 1.91 mg/dL — AB (ref 0.44–1.00)
Calcium: 8.1 mg/dL — ABNORMAL LOW (ref 8.9–10.3)
Chloride: 109 mmol/L (ref 98–111)
GFR calc Af Amer: 37 mL/min — ABNORMAL LOW (ref >60)
GFR, EST NON AFRICAN AMERICAN: 32 mL/min — AB (ref >60)
Glucose, Bld: 116 mg/dL — ABNORMAL HIGH (ref 70–99)
Potassium: 4.1 mmol/L (ref 3.5–5.1)
SODIUM: 136 mmol/L (ref 135–145)

## 2018-08-13 MED ORDER — DEXTROSE-NACL 5-0.45 % IV SOLN
INTRAVENOUS | Status: DC
  Administered 2018-08-13 – 2018-08-14 (×4): via INTRAVENOUS

## 2018-08-13 MED ORDER — DEXTROSE 50 % IV SOLN
INTRAVENOUS | Status: AC
  Administered 2018-08-13: 05:00:00
  Filled 2018-08-13: qty 50

## 2018-08-13 NOTE — Progress Notes (Signed)
CBG per NT was 81. Pt asymptomatic and continues to refuse to drink  Anything except water and refused to eat.

## 2018-08-13 NOTE — Progress Notes (Signed)
PROGRESS NOTE  Kathy Phelps BDZ:[id number] DOB: [birth year] DOA: 08/10/2018 PCP: Patient, No Pcp Per  Brief Narrative: 37 year old woman PMH diabetes mellitus, peripheral artery disease, status post left BKA, presented with ongoing ischemia of the right lower extremity with painful necrotic toes, previously refused surgery.  Admitted for dry gangrene with possible wet gangrene of the right foot.  Assessment/Plan Dry, wet gangrene right foot, with osteomyelitis, status post BKA 11/1 --Has been noncompliant with care, refusing medications, refusing limb stump shrinker, refusing to eat --Needs to work with physical therapy.  Acute kidney injury superimposed on CKD stage III thought to be prerenal in etiology.  Renal ultrasound unremarkable. --May be at baseline at this point.  Complicated by poor oral intake.  Check BMP in a.m.  Anemia of CKD, not far off baseline of 8-10.  --Hemoglobin trending down.  Repeat CBC in a.m.  Nausea, vomiting; gastroparesis? --Continue Reglan.  Check abdominal x-ray to rule out ileus.  Diabetes mellitus on insulin, peripheral arterial disease, presumed gastroparesis, status post bilateral BKA.  Hemoglobin A1c 8.0 Metformin on hold. --Continue gabapentin --Hypoglycemia secondary to poor oral intake.  Decrease Lantus to 15 units daily  Bipolar affective disorder, anxiety, depression, schizophrenia --Continue Xanax as needed, buspirone, Cymbalta, Latuda, Remeron  Noncompliance.   -- Has been noncompliant with care, refusing medications, refusing limb stump shrinker, refusing to eat despite hypoglycemia   Decrease insulin, encourage oral intake, check abdominal x-ray  DVT prophylaxis: heparin when Hgb stable Code Status: Full Family Communication: none Disposition Plan: pending    Murray Hodgkins, MD  [facility] Direct contact: [phone] --Via amion app OR  --[email or web address]; password [email or web address]  7PM-7AM contact night coverage as  above 08/13/2018, 10:10 AM  LOS: 2 days   Consultants:  Orthopedics   Procedures: Transtibial amputation Application of Prevena wound VAC   Antimicrobials:    Interval history/Subjective: Some nausea, not eating, no hungry  Objective: Vitals:  Vitals:   08/12/18 2031 08/13/18 0940  BP: (!) 143/89 (!) 151/96  Pulse: 96 94  Resp: 16   Temp: 98.3 F (36.8 C) 98.4 F (36.9 C)  SpO2: 94% 95%    Exam:  Constitutional:  . Appears calm and comfortable Respiratory:  . CTA bilaterally, no w/r/r.  . Respiratory effort normal. Cardiovascular:  . RRR, no m/r/g Abdomen:  . Soft, nontender, nondistended Psychiatric:  . Mental status o Mood difficult to assess, affect odd  I have personally reviewed the following:   Data: . One episode of hypoglycemia in the last 24 hours, 62. . Creatinine pending down, 1.91. Marland Kitchen Hemoglobin trending down, 7.1.  WBC trending down, 11.6.  Scheduled Meds: . atorvastatin  80 mg Oral q1800  . busPIRone  15 mg Oral TID  . cloNIDine  0.1 mg Oral BID  . docusate sodium  100 mg Oral BID  . DULoxetine  90 mg Oral Daily  . feeding supplement (PRO-STAT SUGAR FREE 64)  30 mL Oral BID  . fluticasone furoate-vilanterol  1 puff Inhalation Daily  . gabapentin  800 mg Oral BID  . insulin aspart  0-9 Units Subcutaneous Q4H  . insulin glargine  15 Units Subcutaneous BID  . lurasidone  120 mg Oral QHS  . metoCLOPramide  5 mg Oral TID AC  . mirtazapine  15 mg Oral QHS  . multivitamin with minerals  1 tablet Oral Daily  . nicotine  21 mg Transdermal Daily  . pantoprazole  40 mg Oral Daily  . pentoxifylline  400 mg Oral TID WC  Continuous Infusions: . sodium chloride 100 mL/hr at 08/11/18 0350  . sodium chloride 10 mL/hr at 08/13/18 0649  . methocarbamol (ROBAXIN) IV      Principal Problem:   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[place]) Active Problems:   Type II diabetes mellitus with renal manifestations, uncontrolled ([facility])   Essential  hypertension   AKI (acute kidney injury) ([place])   Bipolar 1 disorder ([facility])   Hx of BKA, left ([place])   CKD stage 3 secondary to diabetes ([place])   Uncontrolled diabetes mellitus type 2 with peripheral artery disease ([facility])   Gangrene of right foot ([place])   LOS: 2 days

## 2018-08-13 NOTE — Progress Notes (Signed)
NT rechecked CBG with a result of 70. Patient asymptomatic and continued to refuse food and drink. Educated patient on the effects of hypoglycemia. Will continue to monitor patient's status.

## 2018-08-13 NOTE — Progress Notes (Signed)
Patient ID: Kathy Phelps, female   DOB: 1981/03/22, 37 y.o.   MRN: [id number] Patient states that her nausea and vomiting is better.  There is no drainage in the wound VAC canister.  Patient may discharge to home when she is safe with transfers.

## 2018-08-13 NOTE — Progress Notes (Addendum)
1230 Flat affect, refused to eat for breakfast and lunch. She requests for a fruit plate but did not eat. She is aware of low blood sugars since last night due to not eating. Some teachings about diabetis done but pt falls to sleep easily. Dr Sarajane Jews notified.  D5 0.45 NS started. 1830 BP high this evening, BP prn meds given, message sent to Dr Sarajane Jews, called back, no new order.

## 2018-08-13 NOTE — Progress Notes (Signed)
CBG was 62 at 4:36 AM. Pt drowsy, but   easily aroused. Continues to refuse food and drink. Pt was given 25 mL of Dextrose IV per hypoglycemic protocol at 4:53 AM. CBG was rechecked at  5:17 AM with a result of 121. Pt resting.

## 2018-08-13 NOTE — Progress Notes (Signed)
Results for SYBRINA, LANING (MRN [id number]) as of 08/13/2018 09:02  Ref. Range 08/12/2018 21:46 08/13/2018 00:08 08/13/2018 04:36 08/13/2018 05:17 08/13/2018 08:07  Glucose-Capillary Latest Ref Range: 70 - 99 mg/dL 70 81 62 (L) 121 (H) 81  Noted that blood sugars have been less than 70 mg/dl at 0430 today. CBGs trending less than 100 mg/dl over the last 24 hours.  Recommend decreasing Lantus to 15 units daily OR Lantus 10 units BID. (weight based dosage 74.4kg X 0.2 units/kg=14.88 units) Patient will hopefully start eating more. Continue Novolog correction scale as ordered.  Harvel Ricks RN BSN CDE Diabetes Coordinator Pager: [phone]  8am-5pm

## 2018-08-13 NOTE — Progress Notes (Signed)
PT Cancellation Note  Patient Details Name: Kathy Phelps MRN: [id number] DOB: 1981-01-30   Cancelled Treatment:    Reason Eval/Treat Not Completed: Other (comment)   Politely declining PT eval at this time due to nausea;  We discussed PT goals and options for transfers OOB to recliner;  She requests we return tomorrow;   Roney Marion, [place] Pager [phone] Office [phone]    [place]letta [place] 08/13/2018, 12:27 PM

## 2018-08-13 NOTE — Progress Notes (Signed)
Patient had a CBG of 70. Pt drowsy, but easily aroused. Pt refused to eat or drink. Stated "I am not hungry". Will continue to monitor patient;s status.

## 2018-08-13 NOTE — Plan of Care (Signed)
  Problem: Activity: Goal: Risk for activity intolerance will decrease Outcome: Progressing   Problem: Coping: Goal: Level of anxiety will decrease Outcome: Progressing   Problem: Pain Managment: Goal: General experience of comfort will improve Outcome: Progressing   Problem: Safety: Goal: Ability to remain free from injury will improve Outcome: Progressing   Problem: Skin Integrity: Goal: Risk for impaired skin integrity will decrease Outcome: Progressing   

## 2018-08-14 DIAGNOSIS — D638 Anemia in other chronic diseases classified elsewhere: Secondary | ICD-10-CM

## 2018-08-14 DIAGNOSIS — R112 Nausea with vomiting, unspecified: Secondary | ICD-10-CM

## 2018-08-14 DIAGNOSIS — E1151 Type 2 diabetes mellitus with diabetic peripheral angiopathy without gangrene: Secondary | ICD-10-CM

## 2018-08-14 DIAGNOSIS — E1122 Type 2 diabetes mellitus with diabetic chronic kidney disease: Secondary | ICD-10-CM

## 2018-08-14 DIAGNOSIS — E1165 Type 2 diabetes mellitus with hyperglycemia: Secondary | ICD-10-CM

## 2018-08-14 DIAGNOSIS — N183 Chronic kidney disease, stage 3 (moderate): Secondary | ICD-10-CM

## 2018-08-14 LAB — BASIC METABOLIC PANEL
Anion gap: 4 — ABNORMAL LOW (ref 5–15)
BUN: 12 mg/dL (ref 6–20)
CHLORIDE: 107 mmol/L (ref 98–111)
CO2: 23 mmol/L (ref 22–32)
CREATININE: 1.75 mg/dL — AB (ref 0.44–1.00)
Calcium: 7.8 mg/dL — ABNORMAL LOW (ref 8.9–10.3)
GFR calc non Af Amer: 36 mL/min — ABNORMAL LOW (ref >60)
GFR, EST AFRICAN AMERICAN: 41 mL/min — AB (ref >60)
Glucose, Bld: 156 mg/dL — ABNORMAL HIGH (ref 70–99)
POTASSIUM: 3.7 mmol/L (ref 3.5–5.1)
Sodium: 134 mmol/L — ABNORMAL LOW (ref 135–145)

## 2018-08-14 LAB — URINALYSIS, ROUTINE W REFLEX MICROSCOPIC
Bilirubin Urine: NEGATIVE
Glucose, UA: NEGATIVE mg/dL
KETONES UR: NEGATIVE mg/dL
Leukocytes, UA: NEGATIVE
Nitrite: NEGATIVE
PROTEIN: 30 mg/dL — AB
Specific Gravity, Urine: 1.008 (ref 1.005–1.030)
pH: 5 (ref 5.0–8.0)

## 2018-08-14 LAB — GLUCOSE, CAPILLARY
GLUCOSE-CAPILLARY: 112 mg/dL — AB (ref 70–99)
GLUCOSE-CAPILLARY: 117 mg/dL — AB (ref 70–99)
GLUCOSE-CAPILLARY: 181 mg/dL — AB (ref 70–99)
Glucose-Capillary: 137 mg/dL — ABNORMAL HIGH (ref 70–99)
Glucose-Capillary: 193 mg/dL — ABNORMAL HIGH (ref 70–99)
Glucose-Capillary: 93 mg/dL (ref 70–99)
Glucose-Capillary: 113 mg/dL — ABNORMAL HIGH (ref 70–99)

## 2018-08-14 LAB — HEPATIC FUNCTION PANEL
ALBUMIN: 1.8 g/dL — AB (ref 3.5–5.0)
ALK PHOS: 74 U/L (ref 38–126)
ALT: 6 U/L (ref 0–44)
AST: 15 U/L (ref 15–41)
BILIRUBIN TOTAL: 0.5 mg/dL (ref 0.3–1.2)
Bilirubin, Direct: <0.1 mg/dL (ref 0.0–0.2)
Total Protein: 5.9 g/dL — ABNORMAL LOW (ref 6.5–8.1)

## 2018-08-14 LAB — PREPARE RBC (CROSSMATCH)

## 2018-08-14 LAB — CBC
HEMATOCRIT: 21.3 % — AB (ref 36.0–46.0)
HEMOGLOBIN: 6.7 g/dL — AB (ref 12.0–15.0)
MCH: 27.9 pg (ref 26.0–34.0)
MCHC: 31.5 g/dL (ref 30.0–36.0)
MCV: 88.8 fL (ref 80.0–100.0)
NRBC: 0 % (ref 0.0–0.2)
Platelets: 311 10*3/uL (ref 150–400)
RBC: 2.4 MIL/uL — AB (ref 3.87–5.11)
RDW: 12.1 % (ref 11.5–15.5)
WBC: 12.3 10*3/uL — ABNORMAL HIGH (ref 4.0–10.5)

## 2018-08-14 LAB — LIPASE, BLOOD: LIPASE: 18 U/L (ref 11–51)

## 2018-08-14 MED ORDER — FUROSEMIDE 10 MG/ML IJ SOLN
20.0000 mg | Freq: Once | INTRAMUSCULAR | Status: AC
  Administered 2018-08-14: 20 mg via INTRAVENOUS
  Filled 2018-08-14: qty 2

## 2018-08-14 MED ORDER — INSULIN GLARGINE 100 UNIT/ML ~~LOC~~ SOLN
15.0000 [IU] | Freq: Every day | SUBCUTANEOUS | Status: DC
  Filled 2018-08-14: qty 0.15

## 2018-08-14 MED ORDER — SODIUM CHLORIDE 0.9% IV SOLUTION
Freq: Once | INTRAVENOUS | Status: AC
  Administered 2018-08-14: 13:00:00 via INTRAVENOUS

## 2018-08-14 NOTE — Progress Notes (Signed)
PT Cancellation Note  Patient Details Name: Kathy Phelps MRN: [id number] DOB: Aug 20, 1981   Cancelled Treatment:    Reason Eval/Treat Not Completed: (P) Medical issues which prohibited therapy Pt receiving blood transfusion. PT will follow back for Evaluation tomorrow.   Herberto Ledwell B. Migdalia Dk PT, DPT Acute Rehabilitation Services Pager [phone] Office [phone]  [place] 08/14/2018, 2:41 PM

## 2018-08-14 NOTE — Progress Notes (Addendum)
[facility] PROGRESS NOTE  Kathy Phelps IOX:[id number] DOB: 21-Jun-1981 DOA: 08/10/2018  PCP: Patient, No Pcp Per  Brief History/Interval Summary: 37 year old woman PMH diabetes mellitus, peripheral artery disease, status post left BKA, presented with ongoing ischemia of the right lower extremity with painful necrotic toes, previously refused surgery.  Admitted for dry gangrene with possible wet gangrene of the right foot.  Underwent transtibial amputation on 11/1.  Reason for Visit: Acute anemia.  Consultants: Orthopedic surgeon.  Procedures: Transtibial amputation right lower extremity and application of wound VAC on 11/1  Antibiotics: None currently  Subjective/Interval History: Patient not very communicative.  Slow to respond to questions.  States that she continues to have a poor appetite.  Continues to have some nausea.  Had nausea vomiting yesterday.  Denies any abdominal pain.  Has noticed some pain with urinating.  Denies any blood in the urine.  Denies any history of Diabetic gastroparesis previously.  ROS: Denies any shortness of breath  Objective:  Vital Signs  Vitals:   08/13/18 1518 08/13/18 1820 08/13/18 1939 08/14/18 0407  BP: (!) 179/106 (!) 164/102 (!) 161/100 (!) 134/97  Pulse: (!) 101 100 (!) 108 94  Resp: 16     Temp: 98.5 F (36.9 C)  99.3 F (37.4 C) 98.2 F (36.8 C)  TempSrc: Oral  Oral Oral  SpO2: 97% 99% 98% 100%  Weight:      Height:        Intake/Output Summary (Last 24 hours) at 08/14/2018 1005 Last data filed at 08/13/2018 1900 Gross per 24 hour  Intake 210.54 ml  Output -  Net 210.54 ml   Filed Weights   08/10/18 2041 08/11/18 0352 08/12/18 0457  Weight: 72.6 kg 74.3 kg 74.4 kg    General appearance: alert, distracted.  Does not respond to questions appropriately.  In no distress. Head: Normocephalic, without obvious abnormality, atraumatic Resp: clear to auscultation bilaterally Cardio: regular rate and rhythm, S1, S2  normal, no murmur, click, rub or gallop GI: Abdomen is soft.  Vague tenderness in the right abdomen without any rebound rigidity or guarding.  No masses organomegaly.  Bowel sounds are present normal. Extremities: Bilateral lower extremity amputation Neurologic: No focal neurological deficits.  Lab Results:  Data Reviewed: I have personally reviewed following labs and imaging studies  CBC: Recent Labs  Lab 08/10/18 2055 08/11/18 0212 08/13/18 0600 08/14/18 0402  WBC 15.5* 14.3* 11.6* 12.3*  NEUTROABS 12.0*  --   --   --   HGB 8.4* 7.6* 7.1* 6.7*  HCT 27.4* 24.2* 22.7* 21.3*  MCV 89.3 89.6 89.7 88.8  PLT 412* 314 292 268    Basic Metabolic Panel: Recent Labs  Lab 08/10/18 2055 08/11/18 0212 08/13/18 0600 08/14/18 0402  NA 136 137 136 134*  K 3.7 3.6 4.1 3.7  CL 102 105 109 107  CO2 19* 21* 23 23  GLUCOSE 199* 160* 116* 156*  BUN 20 20 12 12   CREATININE 2.62* 2.60* 1.91* 1.75*  CALCIUM 9.2 8.2* 8.1* 7.8*    GFR: Estimated Creatinine Clearance: 45.1 mL/min (A) (by C-G formula based on SCr of 1.75 mg/dL (H)).  Liver Function Tests: Recent Labs  Lab 08/10/18 2055 08/14/18 0402  AST 18 15  ALT 9 6  ALKPHOS 85 74  BILITOT 0.4 0.5  PROT 7.8 5.9*  ALBUMIN 2.7* 1.8*    Recent Labs  Lab 08/10/18 2055 08/14/18 0402  LIPASE 27 18   Coagulation Profile: Recent Labs  Lab 08/10/18 2055  INR  1.08    CBG: Recent Labs  Lab 08/13/18 1640 08/13/18 2036 08/13/18 2337 08/14/18 0405 08/14/18 0751  GLUCAP 127* 173* 143* 137* 193*     Recent Results (from the past 240 hour(s))  Culture, blood (Routine x 2)     Status: None (Preliminary result)   Collection Time: 08/10/18  8:55 PM  Result Value Ref Range Status   Specimen Description BLOOD RIGHT ANTECUBITAL  Final   Special Requests   Final    BOTTLES DRAWN AEROBIC AND ANAEROBIC Blood Culture results may not be optimal due to an excessive volume of blood received in culture bottles   Culture   Final     NO GROWTH 3 DAYS Performed at [facility], [place] [address], [place], [place] [address]    Report Status PENDING  Incomplete  Culture, blood (Routine x 2)     Status: None (Preliminary result)   Collection Time: 08/11/18 12:40 AM  Result Value Ref Range Status   Specimen Description BLOOD LEFT ANTECUBITAL  Final   Special Requests   Final    BOTTLES DRAWN AEROBIC AND ANAEROBIC Blood Culture adequate volume   Culture   Final    NO GROWTH 2 DAYS Performed at [facility], [place] [address] [place], [place] [address]    Report Status PENDING  Incomplete  Surgical pcr screen     Status: None   Collection Time: 08/11/18  8:44 AM  Result Value Ref Range Status   MRSA, PCR NEGATIVE NEGATIVE Final   Staphylococcus aureus NEGATIVE NEGATIVE Final    Comment: (NOTE) The Xpert SA Assay (FDA approved for NASAL specimens in patients 3 years of age and older), is one component of a comprehensive surveillance program. It is not intended to diagnose infection nor to guide or monitor treatment. Performed at [facility], [place] [address], [place], [place] [address]       Radiology Studies: Dg Abd Portable 1v  Result Date: 08/13/2018 CLINICAL DATA:  Nausea and vomiting EXAM: PORTABLE ABDOMEN - 1 VIEW COMPARISON:  None. FINDINGS: Tiny calcifications projected over the medial aspect of the right kidney and just medial to the right kidney. Three or 4 calcifications are noted. The left kidney is largely obscured by bowel contents. Calcifications in the pelvis are nonspecific but may be phleboliths. No bowel obstruction. No other acute abnormalities are identified. IMPRESSION: 1. Calcifications projected over the medial aspect of the right kidney and just medial to the right kidney on one view. Renal stones not excluded. Stones in the right renal pelvis or proximal right ureter not excluded on this study. Recommend clinical correlation. CT imaging would be more specific. 2. No other  abnormalities. Electronically Signed   By: Dorise Bullion III M.D   On: 08/13/2018 16:04     Medications:  Scheduled: . sodium chloride   Intravenous Once  . atorvastatin  80 mg Oral q1800  . busPIRone  15 mg Oral TID  . cloNIDine  0.1 mg Oral BID  . docusate sodium  100 mg Oral BID  . DULoxetine  90 mg Oral Daily  . feeding supplement (PRO-STAT SUGAR FREE 64)  30 mL Oral BID  . fluticasone furoate-vilanterol  1 puff Inhalation Daily  . furosemide  20 mg Intravenous Once  . gabapentin  800 mg Oral BID  . insulin aspart  0-9 Units Subcutaneous Q4H  . insulin glargine  15 Units Subcutaneous BID  . lurasidone  120 mg Oral QHS  .  metoCLOPramide  5 mg Oral TID AC  . mirtazapine  15 mg Oral QHS  . multivitamin with minerals  1 tablet Oral Daily  . nicotine  21 mg Transdermal Daily  . pantoprazole  40 mg Oral Daily  . pentoxifylline  400 mg Oral TID WC   Continuous: . sodium chloride 10 mL/hr at 08/13/18 0649  . dextrose 5 % and 0.45% NaCl 125 mL/hr at 08/14/18 0949  . methocarbamol (ROBAXIN) IV     YPP:[id number], albuterol, alprazolam, bisacodyl, hydrALAZINE, magnesium citrate, methocarbamol **OR** methocarbamol (ROBAXIN) IV, ondansetron **OR** ondansetron (ZOFRAN) IV, oxyCODONE, oxyCODONE, polyethylene glycol    Assessment/Plan:  Gangrene involving right foot with osteomyelitis She is status post below-knee amputation on 11/1.  Patient has been noncompliant with physical therapy.  She apparently has been refusing medications.  She was advised not to do so.  She was advised to cooperate with the nursing staff and with therapy.  Does not appear to be in any pain at this time.  Anemia of chronic kidney disease Usual baseline hemoglobin is around 8-10.  Drop in hemoglobin is noted.  Could be dilutional.  Could be due to operative loss.  No overt bleeding noted currently.  She will be transfused 2 units of blood.  Acute kidney injury on chronic kidney disease stage  III Creatinine continues to improve.  Renal ultrasound was unremarkable.  Nausea and vomiting etiology unclear.   She does mention some discomfort with urinating.  We will check a UA.  Abdominal x-ray did show some calcifications projecting over the right renal system.  Renal ultrasound done a few days ago did not show any shadows concerning for stones.  She had a CT scan renal study in September which did not show any renal stones.  We will first get a UA and then pursue further management.  Diabetic gastroparesis is another possibility.  Continue metoclopramide.  Diabetes mellitus type 2 on insulin HbA1c 8.0.  Metformin is on hold.  She was placed on Lantus.  However her CBGs noted to be hypoglycemic most likely due to poor oral intake.  CBGs appear to be stable over the last 24 hours.  Will decrease the dose of Lantus.  Continue to monitor for now.  SSI.  History of peripheral artery disease Stable.  He is now status post bilateral BKA.  Bipolar disorder with history of anxiety depression and schizophrenia Continue with her psychotropic medications.  History of medical noncompliance The patient has been educated.   DVT Prophylaxis: Avoid heparin products due to significant anemia    Code Status: Full code Family Communication: Discussed with the patient Disposition Plan: Management as outlined above.  Blood transfusion today.  UA is pending.  Patient worked with physical therapy.    LOS: 3 days   [facility] Pager [phone] 08/14/2018, 10:05 AM  If 7PM-7AM, please contact night-coverage at [email or web address], password [email or web address]

## 2018-08-14 NOTE — Progress Notes (Signed)
Inpatient Diabetes Program Recommendations  AACE/ADA: New Consensus Statement on Inpatient Glycemic Control (2019)  Target Ranges:  Prepandial:   less than 140 mg/dL      Peak postprandial:   less than 180 mg/dL (1-2 hours)      Critically ill patients:  140 - 180 mg/dL   Results for Kathy Phelps, Kathy Phelps (MRN [id number]) as of 08/14/2018 08:38  Ref. Range 08/13/2018 08:07 08/13/2018 11:55 08/13/2018 16:40 08/13/2018 20:36 08/13/2018 23:37 08/14/2018 04:05 08/14/2018 07:51  Glucose-Capillary Latest Ref Range: 70 - 99 mg/dL 81      79 127 (H) 173 (H)  Novolog 2 units  Lantus 15 units 143 (H)  Novolog 1 unit 137 (H) 193 (H)   Review of Glycemic Control  Current orders for Inpatient glycemic control: Lantus 15 units BID, Novolog 0-9 units Q4H  Inpatient Diabetes Program Recommendations:  Insulin - Basal: Noted patient did NOT receive morning dose of Lantus on 08/13/18 but did receive Lantus 15 units last night at bedtime. Please consider decreasing frequency of NPH to 15 units QHS.  Thanks, Barnie Alderman, RN, MSN, CDE Diabetes Coordinator Inpatient Diabetes Program [phone] (Team Pager from 8am to 5pm)

## 2018-08-14 NOTE — Plan of Care (Signed)
  Problem: Coping: Goal: Level of anxiety will decrease Outcome: Progressing   Problem: Elimination: Goal: Will not experience complications related to bowel motility Outcome: Progressing Goal: Will not experience complications related to urinary retention Outcome: Progressing   Problem: Pain Managment: Goal: General experience of comfort will improve Outcome: Progressing   Problem: Safety: Goal: Ability to remain free from injury will improve Outcome: Progressing   Problem: Skin Integrity: Goal: Risk for impaired skin integrity will decrease Outcome: Progressing   Problem: Pain Management: Goal: Pain level will decrease with appropriate interventions Outcome: Progressing   Problem: Skin Integrity: Goal: Demonstration of wound healing without infection will improve Outcome: Progressing

## 2018-08-14 NOTE — Progress Notes (Signed)
CRITICAL VALUE ALERT  Critical Value:  Hgb 6.7  Date & Time Notied:  08/14/18 0820  Provider Notified: MD Joaquim Nam  Orders Received/Actions taken: Awaiting return call or orders, will continue to monitor.

## 2018-08-15 LAB — TYPE AND SCREEN
ABO/RH(D): A POS
Antibody Screen: NEGATIVE
Unit division: 0
Unit division: 0

## 2018-08-15 LAB — COMPREHENSIVE METABOLIC PANEL
ALBUMIN: 2 g/dL — AB (ref 3.5–5.0)
ALT: 5 U/L (ref 0–44)
ANION GAP: 4 — AB (ref 5–15)
AST: 13 U/L — AB (ref 15–41)
Alkaline Phosphatase: 83 U/L (ref 38–126)
BILIRUBIN TOTAL: 0.8 mg/dL (ref 0.3–1.2)
BUN: 8 mg/dL (ref 6–20)
CO2: 24 mmol/L (ref 22–32)
Calcium: 8.1 mg/dL — ABNORMAL LOW (ref 8.9–10.3)
Chloride: 108 mmol/L (ref 98–111)
Creatinine, Ser: 1.74 mg/dL — ABNORMAL HIGH (ref 0.44–1.00)
GFR calc Af Amer: 42 mL/min — ABNORMAL LOW (ref >60)
GFR, EST NON AFRICAN AMERICAN: 36 mL/min — AB (ref >60)
GLUCOSE: 127 mg/dL — AB (ref 70–99)
POTASSIUM: 4 mmol/L (ref 3.5–5.1)
Sodium: 136 mmol/L (ref 135–145)
TOTAL PROTEIN: 6.4 g/dL — AB (ref 6.5–8.1)

## 2018-08-15 LAB — CBC
HEMATOCRIT: 30.3 % — AB (ref 36.0–46.0)
Hemoglobin: 9.6 g/dL — ABNORMAL LOW (ref 12.0–15.0)
MCH: 27.6 pg (ref 26.0–34.0)
MCHC: 31.7 g/dL (ref 30.0–36.0)
MCV: 87.1 fL (ref 80.0–100.0)
NRBC: 0 % (ref 0.0–0.2)
Platelets: 318 10*3/uL (ref 150–400)
RBC: 3.48 MIL/uL — ABNORMAL LOW (ref 3.87–5.11)
RDW: 12.6 % (ref 11.5–15.5)
WBC: 13.5 10*3/uL — AB (ref 4.0–10.5)

## 2018-08-15 LAB — BPAM RBC
BLOOD PRODUCT EXPIRATION DATE: 201911282359
Blood Product Expiration Date: 201911282359
ISSUE DATE / TIME: 201911041112
ISSUE DATE / TIME: 201911041431
UNIT TYPE AND RH: 6200
Unit Type and Rh: 6200

## 2018-08-15 LAB — GLUCOSE, CAPILLARY
GLUCOSE-CAPILLARY: 137 mg/dL — AB (ref 70–99)
Glucose-Capillary: 123 mg/dL — ABNORMAL HIGH (ref 70–99)
Glucose-Capillary: 110 mg/dL — ABNORMAL HIGH (ref 70–99)
Glucose-Capillary: 117 mg/dL — ABNORMAL HIGH (ref 70–99)

## 2018-08-15 LAB — CULTURE, BLOOD (ROUTINE X 2): Culture: NO GROWTH

## 2018-08-15 MED ORDER — OXYCODONE-ACETAMINOPHEN 5-325 MG PO TABS: 1.0000 | ORAL_TABLET | ORAL | 0 refills | Status: DC | PRN

## 2018-08-15 MED ORDER — POLYETHYLENE GLYCOL 3350 17 G PO PACK: 17.0000 g | PACK | Freq: Every day | ORAL | 0 refills | Status: DC

## 2018-08-15 MED ORDER — INSULIN GLARGINE 100 UNIT/ML ~~LOC~~ SOLN: 15.0000 [IU] | Freq: Every day | SUBCUTANEOUS | 0 refills | Status: DC

## 2018-08-15 MED ORDER — METHOCARBAMOL 500 MG PO TABS: 500.0000 mg | ORAL_TABLET | Freq: Four times a day (QID) | ORAL | 0 refills | Status: DC | PRN

## 2018-08-15 NOTE — Discharge Summary (Addendum)
[facility]  Physician Discharge Summary   Patient ID: Kathy Phelps MRN: [id number] DOB/AGE: 39-08-1981 39 y.o.  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 Home health will be ordered 2. Patient instructed to follow-up with Dr. Sharol Given in 1 week   DISCHARGE CONDITION: fair  Diet recommendation: Modified carbohydrate  Filed Weights   08/10/18 2041 08/11/18 0352 08/12/18 0457  Weight: 72.6 kg 74.3 kg 74.4 kg    INITIAL HISTORY: 37 year old woman PMH diabetes mellitus, peripheral artery disease, status post left BKA, presented with ongoing ischemia of the right lower extremity with painful necrotic toes, previously refused surgery. Admitted for dry gangrene with possible wet gangrene of the right foot.  Underwent transtibial amputation on 11/1.  Consultants: Orthopedic surgeon.  Procedures: Transtibial amputation right lower extremity and application of wound VAC on 11/1   HOSPITAL COURSE:   Gangrene involving right foot with osteomyelitis No clear evidence for sepsis at admission. She is status post below-knee amputation on 11/1.  Patient has not been very compliant with physical therapy here in the hospital.  She finally allowed them to evaluate her today.  Home health has been recommended which will be ordered.  She apparently has been refusing medications as well.  Patient to follow-up with Dr. Sharol Given in 1 week.  Anemia of chronic kidney disease Usual baseline hemoglobin is around 8-10.    Drop in hemoglobin was noted on  11/4-6.7.  She was transfused 2 units of blood with improvement in hemoglobin to 9.6.  No overt bleeding noted.  Follow-up as outpatient.    Acute kidney injury on chronic kidney disease stage III Pain is back to baseline.  Renal ultrasound was unremarkable.  Nausea and vomiting likely due to diabetic gastroparesis Patient underwent abdominal films with suggested shadows around the right kidney.  UA was done which showed did not show any RBCs.  Symptoms most likely due to diabetic gastroparesis.  She also underwent renal ultrasound which did not show any shadow suggesting stones.  She underwent a CT scan of renal system in September which did not show any renal stones.  Patient has not been noted to have any nausea vomiting.  She is just refusing to eat food.  She denies any suicidal ideations.  Has not been very cooperative with nursing staff.  Abdomen remains benign to examination.  Continue metoclopramide which she takes at home as well.  Diabetes mellitus type 2 on insulin with hypoglycemia HbA1c 8.0.  Continue home medications.  Lantus dose to be reduced.  Patient has to check her CBGs.  History of peripheral artery disease Stable.  She is now status post bilateral BKA.  Bipolar disorder with history of anxiety depression and schizophrenia Continue with her psychotropic medications.  History of medical noncompliance The patient has been educated.  Patient has not been very cooperative with nursing staff the hospital.  She has not been motivated to do anything in the hospital.  She does not appear to be depressed.  Just noncompliant.  Blood pressure is noted to be elevated likely because she has been refusing her blood pressure medications.  No nausea vomiting has been noted by nursing  staff over the last 48 hours.  Patient agreeable to going home.  She will be discharged today.    PERTINENT LABS:  The results of significant diagnostics from this hospitalization (including imaging,  microbiology, ancillary and laboratory) are listed below for reference.    Microbiology: Recent Results (from the past 240 hour(s))  Culture, blood (Routine x 2)     Status: None (Preliminary result)   Collection Time: 08/10/18  8:55 PM  Result Value Ref [place] Status   Specimen Description BLOOD RIGHT ANTECUBITAL  Final   Special Requests   Final    BOTTLES DRAWN AEROBIC AND ANAEROBIC Blood Culture results may not be optimal due to an excessive volume of blood received in culture bottles   Culture   Final    NO GROWTH 4 DAYS Performed at [facility] [address], [place], [place] [address]    Report Status PENDING  Incomplete  Culture, blood (Routine x 2)     Status: None (Preliminary result)   Collection Time: 08/11/18 12:40 AM  Result Value Ref [place] Status   Specimen Description BLOOD LEFT ANTECUBITAL  Final   Special Requests   Final    BOTTLES DRAWN AEROBIC AND ANAEROBIC Blood Culture adequate volume   Culture   Final    NO GROWTH 3 DAYS Performed at [facility], [place] [address] [place], [place] [address]    Report Status PENDING  Incomplete  Surgical pcr screen     Status: None   Collection Time: 08/11/18  8:44 AM  Result Value Ref [place] Status   MRSA, PCR NEGATIVE NEGATIVE Final   Staphylococcus aureus NEGATIVE NEGATIVE Final    Comment: (NOTE) The Xpert SA Assay (FDA approved for NASAL specimens in patients 65 years of age and older), is one component of a comprehensive surveillance program. It is not intended to diagnose infection nor to guide or monitor treatment. Performed at [facility], [place] [address], [place],  [address]      Labs: Basic Metabolic Panel: Recent Labs  Lab 08/10/18 2055 08/11/18 0212 08/13/18 0600 08/14/18 0402 08/15/18 0132  NA 136 137 136 134* 136  K 3.7 3.6 4.1 3.7 4.0  CL 102 105 109 107 108  CO2 19* 21* 23 23 24   GLUCOSE 199* 160* 116* 156* 127*  BUN 20 20 12 12 8   CREATININE 2.62* 2.60* 1.91*  1.75* 1.74*  CALCIUM 9.2 8.2* 8.1* 7.8* 8.1*   Liver Function Tests: Recent Labs  Lab 08/10/18 2055 08/14/18 0402 08/15/18 0132  AST 18 15 13*  ALT 9 6 5   ALKPHOS 85 74 83  BILITOT 0.4 0.5 0.8  PROT 7.8 5.9* 6.4*  ALBUMIN 2.7* 1.8* 2.0*   Recent Labs  Lab 08/10/18 2055 08/14/18 0402  LIPASE 27 18   CBC: Recent Labs  Lab 08/10/18 2055 08/11/18 0212 08/13/18 0600 08/14/18 0402 08/15/18 0132  WBC 15.5* 14.3* 11.6* 12.3* 13.5*  NEUTROABS 12.0*  --   --   --   --   HGB 8.4* 7.6* 7.1* 6.7* 9.6*  HCT 27.4* 24.2* 22.7* 21.3* 30.3*  MCV 89.3 89.6 89.7 88.8 87.1  PLT 412* 314 292 311 318    CBG: Recent Labs  Lab 08/14/18 1952 08/14/18 2331 08/15/18 0601 08/15/18 0837 08/15/18 1120  GLUCAP 113* 112* 137* 123* 110*     IMAGING STUDIES US Renal  Result Date: 08/11/2018 CLINICAL DATA:  38 year old female with acute renal insufficiency. EXAM: RENAL / URINARY TRACT ULTRASOUND  COMPLETE COMPARISON:  Abdominal CT dated 06/25/2018 FINDINGS: Right Kidney: Renal measurements: 11.5 x 4.6 x 5.1 cm = volume: 145.8 mL. Normal echogenicity. No hydronephrosis or shadowing stone. Left Kidney: Renal measurements: 12.4 x 5.3 x 5.5 cm = volume: 193.3 mL. Normal echogenicity. No hydronephrosis or shadowing stone. Bladder: Appears normal for degree of bladder distention. IMPRESSION: Unremarkable renal ultrasound. Electronically Signed   By: Anner Crete M.D.   On: 08/11/2018 03:54   Dg Abd Portable 1v  Result Date: 08/13/2018 CLINICAL DATA:  Nausea and vomiting EXAM: PORTABLE ABDOMEN - 1 VIEW COMPARISON:  None. FINDINGS: Tiny calcifications projected over the medial aspect of the right kidney and just medial to the right kidney. Three or 4 calcifications are noted. The left kidney is largely obscured by bowel contents. Calcifications in the pelvis are nonspecific but may be phleboliths. No bowel obstruction. No other acute abnormalities are identified. IMPRESSION: 1. Calcifications  projected over the medial aspect of the right kidney and just medial to the right kidney on one view. Renal stones not excluded. Stones in the right renal pelvis or proximal right ureter not excluded on this study. Recommend clinical correlation. CT imaging would be more specific. 2. No other abnormalities. Electronically Signed   By: Dorise Bullion III M.D   On: 08/13/2018 16:04   Dg Foot Complete Right  Result Date: 08/10/2018 CLINICAL DATA:  Initial evaluation for acute pain and soreness, concern for infection. History of diabetes. EXAM: RIGHT FOOT COMPLETE - 3+ VIEW COMPARISON:  Prior radiograph from 08/03/2008. FINDINGS: Soft tissue irregularity with ulceration overlies the lateral aspect of the right forefoot at the level of the right fourth and fifth digits. Probable involvement of the right third digit as well. Findings concerning for possible soft tissue infection given provided history. No convincing osseous erosions or periosteal reaction to suggest acute osteomyelitis. No acute fracture or dislocation. Mild scattered degenerative changes noted about the foot and ankle. IMPRESSION: 1. Soft tissue irregularity with ulceration involving the right for foot, most evident at the right third through fifth digits. Findings concerning for soft tissue infection/cellulitis given provided history. No convincing radiographic evidence for active osteomyelitis at this time. 2. No other acute osseous abnormality. Electronically Signed   By: Jeannine Boga M.D.   On: 08/10/2018 22:07    DISCHARGE EXAMINATION: Vitals:   08/14/18 1745 08/14/18 1954 08/15/18 0512 08/15/18 0835  BP: (!) 165/87 (!) 181/101 (!) 177/105 (!) 172/99  Pulse: 89 89 86 87  Resp: 16 18 20    Temp: 98.6 F (37 C) 98.7 F (37.1 C) 98.3 F (36.8 C) 99 F (37.2 C)  TempSrc: Oral Oral Oral Oral  SpO2: 96% 100% 95% 97%  Weight:      Height:       General appearance: alert, cooperative, appears stated age and no distress Resp:  clear to auscultation bilaterally Cardio: regular rate and rhythm, S1, S2 normal, no murmur, click, rub or gallop GI: soft, non-tender; bowel sounds normal; no masses,  no organomegaly  DISPOSITION: Home with home health  Discharge Instructions    Call MD for:  difficulty breathing, headache or visual disturbances   Complete by:  As directed    Call MD for:  extreme fatigue   Complete by:  As directed    Call MD for:  persistant dizziness or light-headedness   Complete by:  As directed    Call MD for:  persistant nausea and vomiting   Complete by:  As directed    Call  MD for:  severe uncontrolled pain   Complete by:  As directed    Call MD for:  temperature >100.4   Complete by:  As directed    Diet Carb Modified   Complete by:  As directed    Discharge instructions   Complete by:  As directed    Please take your medications as prescribed.  Please be sure to follow-up with Dr. Sharol Given.  You were cared for by a hospitalist during your hospital stay. If you have any questions about your discharge medications or the care you received while you were in the hospital after you are discharged, you can call the unit and asked to speak with the hospitalist on call if the hospitalist that took care of you is not available. Once you are discharged, your primary care physician will handle any further medical issues. Please note that NO REFILLS for any discharge medications will be authorized once you are discharged, as it is imperative that you return to your primary care physician (or establish a relationship with a primary care physician if you do not have one) for your aftercare needs so that they can reassess your need for medications and monitor your lab values. If you do not have a primary care physician, you can call [phone] for a physician referral.   Increase activity slowly   Complete by:  As directed         Allergies as of 08/15/2018      Reactions   Hydrocodone-acetaminophen Hives    Tylenol [acetaminophen] Itching, Swelling   Patient tolerated APAP 650 mg (06/29/18) as well as oxycodone/APAP 5-325 mg during 06/25/2018 admission.      Medication List    STOP taking these medications   famotidine 20 MG tablet Commonly known as:  PEPCID   metFORMIN 1000 MG tablet Commonly known as:  GLUCOPHAGE   naproxen 500 MG tablet Commonly known as:  NAPROSYN   simvastatin 40 MG tablet Commonly known as:  ZOCOR     TAKE these medications   ADVAIR DISKUS 100-50 MCG/DOSE Aepb Generic drug:  Fluticasone-Salmeterol Inhale 1 puff into the lungs daily.   alprazolam 2 MG tablet Commonly known as:  XANAX Take 1 tablet (2 mg total) by mouth 4 (four) times daily as needed for sleep. What changed:  when to take this   aspirin EC 81 MG tablet Take 1 tablet (81 mg total) by mouth daily.   atorvastatin 80 MG tablet Commonly known as:  LIPITOR Take 1 tablet (80 mg total) by mouth daily at 6 PM.   busPIRone 15 MG tablet Commonly known as:  BUSPAR Take 15 mg by mouth 3 (three) times daily.   cloNIDine 0.1 MG tablet Commonly known as:  CATAPRES Take 0.1 mg by mouth 2 (two) times daily.   clopidogrel 75 MG tablet Commonly known as:  PLAVIX Take 1 tablet (75 mg total) by mouth daily with breakfast. What changed:  when to take this   DULoxetine 30 MG capsule Commonly known as:  CYMBALTA Take 90 mg by mouth every morning.   gabapentin 800 MG tablet Commonly known as:  NEURONTIN Take 1 tablet (800 mg total) by mouth 2 (two) times daily. What changed:  when to take this   insulin glargine 100 UNIT/ML injection Commonly known as:  LANTUS Inject 0.15 mLs (15 Units total) into the skin at bedtime. 20 U in the morning and 30 U in the evening What changed:    how much to take  when to take this   INSULIN SYRINGE .5CC/31GX5/16" 31G X 5/16" 0.5 ML Misc 1 each by Does not apply route QID.   LATUDA 120 MG Tabs Generic drug:  Lurasidone HCl Take 120 mg by mouth at  bedtime.   methocarbamol 500 MG tablet Commonly known as:  ROBAXIN Take 1 tablet (500 mg total) by mouth every 6 (six) hours as needed for muscle spasms.   metoCLOPramide 5 MG tablet Commonly known as:  REGLAN Take 1 tablet (5 mg total) by mouth 3 (three) times daily before meals.   mirtazapine 15 MG tablet Commonly known as:  REMERON Take 15 mg by mouth at bedtime.   nicotine 21 mg/24hr patch Commonly known as:  NICODERM CQ - dosed in mg/24 hours Place 1 patch (21 mg total) onto the skin daily.   nitroGLYCERIN 0.2 mg/hr patch Commonly known as:  NITRODUR - Dosed in mg/24 hr Place 1 patch (0.2 mg total) onto the skin daily.   NOVOLOG 100 UNIT/ML injection Generic drug:  insulin aspart Inject 0-30 Units into the skin 3 (three) times daily with meals. Sliding Scale:  >300 15 units < 300 do not use   oxyCODONE-acetaminophen 5-325 MG tablet Commonly known as:  PERCOCET/ROXICET Take 1-2 tablets by mouth every 4 (four) hours as needed for severe pain.   pantoprazole 40 MG tablet Commonly known as:  PROTONIX Take 1 tablet (40 mg total) by mouth daily. What changed:    when to take this  reasons to take this   pentoxifylline 400 MG CR tablet Commonly known as:  TRENTAL Take 1 tablet (400 mg total) by mouth 3 (three) times daily with meals.   polyethylene glycol packet Commonly known as:  MIRALAX / GLYCOLAX Take 17 g by mouth daily.   PROAIR HFA 108 (90 Base) MCG/ACT inhaler Generic drug:  albuterol Inhale 2 puffs into the lungs every 6 (six) hours as needed for shortness of breath.   silver sulfADIAZINE 1 % cream Commonly known as:  SILVADENE Apply 1 application topically daily.   zolpidem 10 MG tablet Commonly known as:  AMBIEN Take 10 mg by mouth at bedtime as needed for sleep.        Follow-up Information    Newt Minion, MD In 1 week.   Specialty:  Orthopedic Surgery Contact information: [place]  [phone]           TOTAL DISCHARGE TIME: 35 mins  Bonnielee Haff  [facility] Pager [phone]  08/15/2018, 1:09 PM

## 2018-08-15 NOTE — Progress Notes (Signed)
Flat affect.  Patient found sleeping most of the time when checked frequently.  When encouraged to eat diet tray, the patient seems irritated and states " I can't"  No nausea or vomiting noted.

## 2018-08-15 NOTE — Evaluation (Signed)
Physical Therapy Evaluation Patient Details Name: Kathy Phelps MRN: [id number] DOB: May 13, 1981 Today's Date: 08/15/2018   History of Present Illness  37yo female with failure of revascularization attempts of R LE and who received R BKA 08/11/18. PMH bipolar disorder, hx dehiscence L BKA, L BKA, DM, HTN, CVA, schizophrenia, L BKA revision   Clinical Impression   Patient received sleeping in bed, easily woken and willing to participate in brief PT session today. Able to perform bed mobility with Mod(I), however declines practicing lateral scoot transfer into [facility] or practicing lateral scooting along EOB. Education provided that she will need to practice transfers with PT prior to discharge; she agrees but also states that her husband can just pick her up and transfer her this way as he has done this in the past. Evaluation very limited today due to patient refusing to attempt transfers and dizziness while sitting at EOB. For now recommend HHPT, sliding board, and drop arm 3 in 1, will update recommendations as appropriate when patient is willing to attempt further mobility.     Follow Up Recommendations Home health PT    Equipment Recommendations  Other (comment);3in1 (PT)(sliding board, will need drop arm 3 in 1 )    Recommendations for Other Services       Precautions / Restrictions Precautions Precautions: Fall;Other (comment) Precaution Comments: watch vitals- gets dizzy with positional changes  Restrictions Weight Bearing Restrictions: Yes RLE Weight Bearing: Non weight bearing Other Position/Activity Restrictions: NWB B LEs       Mobility  Bed Mobility Overal bed mobility: Modified Independent             General bed mobility comments: able to easily come to EOB with Mod(I), no physical assist given and declines getting into WC or practicing scooting along EOB   Transfers                 General transfer comment: pt declined   Ambulation/Gait              General Gait Details: unable- NWB B LEs/bilateral BKAs   Stairs            Wheelchair Mobility    Modified Rankin (Stroke Patients Only)       Balance Overall balance assessment: No apparent balance deficits (not formally assessed)                                           Pertinent Vitals/Pain Pain Assessment: No/denies pain    Home Living Family/patient expects to be discharged to:: Other (Comment)(motel ) Living Arrangements: Children;Other relatives Available Help at Discharge: Family;Available PRN/intermittently Type of Home: Other(Comment)(motel ) Home Access: Level entry     Home Layout: One level Home Equipment: Walker - 2 wheels;Wheelchair - manual;Bedside commode      Prior Function Level of Independence: Independent with assistive device(s)         Comments: reports she used RW to "hop" around prior to surgery, has been waiting on L residual limb to shrink down prior to getting prosthetic      Hand Dominance        Extremity/Trunk Assessment   Upper Extremity Assessment Upper Extremity Assessment: Overall WFL for tasks assessed    Lower Extremity Assessment Lower Extremity Assessment: Overall WFL for tasks assessed    Cervical / Trunk Assessment Cervical / Trunk Assessment: Normal  Communication  Communication: No difficulties  Cognition Arousal/Alertness: Awake/alert Behavior During Therapy: WFL for tasks assessed/performed Overall Cognitive Status: Within Functional Limits for tasks assessed                                        General Comments      Exercises     Assessment/Plan    PT Assessment Patient needs continued PT services  PT Problem List Decreased mobility;Decreased safety awareness;Decreased coordination       PT Treatment Interventions DME instruction;Therapeutic activities;Gait training;Therapeutic exercise;Patient/family education;Stair training;Balance  training;Functional mobility training;Neuromuscular re-education    PT Goals (Current goals can be found in the Care Plan section)  Acute Rehab PT Goals Patient Stated Goal: go home  PT Goal Formulation: With patient Time For Goal Achievement: 08/29/18 Potential to Achieve Goals: Fair    Frequency Min 3X/week   Barriers to discharge        Co-evaluation               AM-PAC PT "6 Clicks" Daily Activity  Outcome Measure Difficulty turning over in bed (including adjusting bedclothes, sheets and blankets)?: None Difficulty moving from lying on back to sitting on the side of the bed? : None Difficulty sitting down on and standing up from a chair with arms (e.g., wheelchair, bedside commode, etc,.)?: Unable Help needed moving to and from a bed to chair (including a wheelchair)?: A Lot Help needed walking in hospital room?: Total Help needed climbing 3-5 steps with a railing? : Total 6 Click Score: 13    End of Session   Activity Tolerance: Patient tolerated treatment well Patient left: in bed;with call bell/phone within reach;with family/visitor present   PT Visit Diagnosis: Other abnormalities of gait and mobility (R26.89)    Time: 0131-4388 PT Time Calculation (min) (ACUTE ONLY): 10 min   Charges:   PT Evaluation $PT Eval Moderate Complexity: 1 Mod          Deniece Ree PT, DPT, CBIS  Supplemental Physical Therapist [place]    Pager [phone] Acute Rehab Office [phone]

## 2018-08-15 NOTE — Progress Notes (Signed)
Written discharge instructions provided.  Portable prevana provided to the patient.  The patient has the sliding board for home discharge.  Patient verbalizes understanding discharge instructions and follow up.  The patient will be discharged via wheel chair accompanied with her husband.

## 2018-08-15 NOTE — Care Management Note (Signed)
Case Management Note  Patient Details  Name: Kathy Phelps MRN: [id number] Date of Birth: [birth year]  Subjective/Objective:   Right foot osteo s/p BKA                  Action/Plan: NCM spoke to pt and states she had [facility] in the past. [facility] and unable to accept referral. Pt has wheelchair and bedside commode at home. Contacted [facility] for sliding board. Contacted [facility] for [facility].   Expected Discharge Date:  08/15/18               Expected Discharge Plan:  [place]  In-House Referral:  NA  Discharge planning Services  CM Consult  Post Acute Care Choice:  Home Health Choice offered to:  Patient  DME Arranged:  Other see comment DME Agency:  [place] Arranged:  PT [place] Agency:  [facility]  Status of Service:  Completed, signed off  If discussed at [place] of Stay Meetings, dates discussed:    Additional Comments:  Erenest Rasher, RN 08/15/2018, 5:09 PM

## 2018-08-15 NOTE — Progress Notes (Signed)
CSW acknowledges SNF consults. CSW reviewed PT recommendations of Home Health and agrees. Patient will not be a SNF candidate due to refusal of participation with PT  Please notify for any further SW needs.   CSW signing off. Pricilla Riffle, LCSW [phone]

## 2018-08-16 LAB — CULTURE, BLOOD (ROUTINE X 2)
Culture: NO GROWTH
Special Requests: ADEQUATE

## 2018-09-04 ENCOUNTER — Ambulatory Visit (INDEPENDENT_AMBULATORY_CARE_PROVIDER_SITE_OTHER): Payer: Medicaid Other | Admitting: Physician Assistant

## 2018-09-05 ENCOUNTER — Ambulatory Visit (INDEPENDENT_AMBULATORY_CARE_PROVIDER_SITE_OTHER): Payer: Medicaid Other | Admitting: Physician Assistant

## 2018-09-05 ENCOUNTER — Encounter (INDEPENDENT_AMBULATORY_CARE_PROVIDER_SITE_OTHER): Payer: Self-pay | Admitting: Physician Assistant

## 2018-09-05 VITALS — Ht 69.0 in | Wt 164.0 lb

## 2018-09-05 DIAGNOSIS — E1122 Type 2 diabetes mellitus with diabetic chronic kidney disease: Secondary | ICD-10-CM

## 2018-09-05 DIAGNOSIS — N183 Chronic kidney disease, stage 3 unspecified: Secondary | ICD-10-CM

## 2018-09-05 DIAGNOSIS — Z794 Long term (current) use of insulin: Secondary | ICD-10-CM

## 2018-09-05 DIAGNOSIS — Z89511 Acquired absence of right leg below knee: Secondary | ICD-10-CM

## 2018-09-05 DIAGNOSIS — Z89512 Acquired absence of left leg below knee: Secondary | ICD-10-CM

## 2018-09-05 DIAGNOSIS — E1142 Type 2 diabetes mellitus with diabetic polyneuropathy: Secondary | ICD-10-CM

## 2018-09-05 MED ORDER — OXYCODONE-ACETAMINOPHEN 5-325 MG PO TABS: 1.0000 | ORAL_TABLET | Freq: Four times a day (QID) | ORAL | 0 refills | Status: DC | PRN

## 2018-09-05 NOTE — Progress Notes (Signed)
Office Visit Note   Patient: Kathy Phelps           Date of Birth: 1980/12/21           MRN: [id number] Visit Date: 09/05/2018              Requested by: No referring provider defined for this encounter. PCP: Patient, No Pcp Per  Chief Complaint  Patient presents with  . Right Leg - Routine Post Op    08/11/18 right BKA 1st post op appt      HPI: The patient is a 37 year old female who is seen for postoperative follow-up following a right transtibial amputation on 08/11/2018 and a previous left transtibial amputation which required revision the last surgery being on 05/24/2018.  She comes in today with a Praveena VAC on the right transtibial amputation site and reports that this is been in place since surgery on 08/11/2018 as she was apparently inadvertently given to [place] and just plugged in the second machine once the first one ran out of battery power.  The VAC just ran out of power today and was alarming and she came to the clinic.  She has not been to see [place] [facility] with her prior amputation yet and she comes in without dressings or stump shrinker stockings today and we did provide her with bilateral silver stump shrinker stockings today in the clinic.  She further reports that she is now homeless and living with friends and would sit with her children.  She has limited transportation.  Assessment & Plan: Visit Diagnoses:  1. S/P bilateral BKA (below knee amputation) ([place])   2. Type 2 diabetes mellitus with diabetic polyneuropathy, with long-term current use of insulin ([place])   3. CKD stage 3 secondary to diabetes [facility])     Plan: Staples were harvested from the right transtibial amputation today and bilateral silver stump shrinker stockings were supplied and on for the patient and she was instructed to utilize these at all times except for showering.  She was also given a prescription for bilateral below the knee K3 prosthetics for [facility].  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  She does need to be able to change speeds while walking in public places and will also be required to walk on uneven surfaces such as grass, gravel, curbs, ramps, stairs and bleachers.  Percocet was refilled this visit.  We will plan to see her back here in 4 weeks or sooner should she have difficulties in the interim.  Follow-Up Instructions: Return in about 4 weeks (around 10/03/2018).   Ortho Exam  Patient is alert, oriented, no adenopathy, well-dressed, normal affect, normal respiratory effort. The Praveena VAC was removed from the right transtibial amputation site and the incision is clean dry and intact and without signs of infection or cellulitis.  Staples were harvested this visit.  There is minimal edema of the residual limb. The left transtibial amputation site is well-healed with no signs of cellulitis or infection.  There is mild edema.  She does not have stump shrinker stockings on either side and these were provided for her today.  Imaging: No results found.   Labs: Lab Results  Component Value Date   HGBA1C 8.0 (H) 08/11/2018   HGBA1C 8.0 (H) 05/24/2018   HGBA1C 9.1 (H) 02/01/2018   ESRSEDRATE 128 (H) 08/11/2018   ESRSEDRATE 63 (H) 12/13/2017   ESRSEDRATE 72 (  H) 09/02/2017   CRP 7.0 (H) 08/11/2018   CRP 3.5 (H) 09/02/2017   REPTSTATUS 08/16/2018 FINAL 08/11/2018   GRAMSTAIN  04/25/2018    FEW WBC PRESENT,BOTH PMN AND MONONUCLEAR FEW GRAM POSITIVE COCCI RARE GRAM POSITIVE RODS Performed at [facility], [place] [address], [place], [place] [address]    CULT  08/11/2018    NO GROWTH 5 DAYS Performed at [facility] [address] [place],  [address]    [place] OXYTOCA 04/25/2018   LABORGA STAPHYLOCOCCUS AUREUS 04/25/2018     Lab Results  Component Value Date   ALBUMIN 2.0 (L) 08/15/2018   ALBUMIN 1.8 (L) 08/14/2018   ALBUMIN 2.7 (L)  08/10/2018   PREALBUMIN 11.0 (L) 08/11/2018   PREALBUMIN 15.3 (L) 09/02/2017    Body mass index is 24.22 kg/m.  Orders:  No orders of the defined types were placed in this encounter.  Meds ordered this encounter  Medications  . oxyCODONE-acetaminophen (PERCOCET/ROXICET) 5-325 MG tablet    Sig: Take 1 tablet by mouth every 6 (six) hours as needed for moderate pain or severe pain.    Dispense:  30 tablet    Refill:  0     Procedures: No procedures performed  Clinical Data: No additional findings.  ROS:  All other systems negative, except as noted in the HPI. Review of Systems  Objective: Vital Signs: Ht 5\' 9"  (1.753 m)   Wt 164 lb (74.4 kg)   LMP 08/10/2018   BMI 24.22 kg/m   Specialty Comments:  No specialty comments available.  PMFS History: Patient Active Problem List   Diagnosis Date Noted  . 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[place]) 08/11/2018  . CKD stage 3 secondary to diabetes ([place]) 08/11/2018  . Uncontrolled diabetes mellitus type 2 with peripheral artery disease ([place]) 08/11/2018  . Gangrene of right foot ([place])   . Lower limb ischemia 06/25/2018  . Lymphangitis 06/25/2018  . Menorrhagia 06/25/2018  . Hx of BKA, left ([place]) 05/24/2018  . History of left below knee amputation ([place]) 02/01/2018  . Gangrene of left foot ([place])   . Dehiscence of amputation stump ([place]) 01/26/2018  . Surgical wound, non healing 09/02/2017  . Bipolar 1 disorder ([place]) 09/02/2017  . Nausea vomiting and diarrhea 08/11/2017  . Great toe pain, left 08/11/2017  . GERD (gastroesophageal reflux disease) 08/10/2017  . Anxiety 08/10/2017  . Edema   . Cellulitis   . AKI (acute kidney injury) ([place]) 02/04/2017  . Type II diabetes mellitus with renal manifestations, uncontrolled ([place]) 02/02/2017  . HLD (hyperlipidemia) 02/02/2017  . Tobacco abuse 02/02/2017  . Essential hypertension 02/02/2017  . Malnutrition of moderate degree 02/02/2017   Past Medical History:  Diagnosis Date  .  Anxiety   . Asthma   . Bipolar affective ([place])   . Complete miscarriage   . Constipation   . 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 Depression   . Diabetes mellitus    Type II  . Dyspnea     " only at night and I stop breathing in my sleep too for about the last three weeks"  . GERD (gastroesophageal reflux disease)   . Headache    "migraraines"  . Hyperlipidemia   . Hypertension   . Osteomyelitis ([place])    left foot  . Pregnancy complication    HELP Syndrome  . Renal disorder   . Schizophrenia ([place])   . Stroke [facility])    "  mild stroke", memory loss- approx 2017    Family History  Problem Relation Age of Onset  . Diabetes Mellitus II Mother   . Heart disease Mother   . Heart disease Father     Past Surgical History:  Procedure Laterality Date  . ABDOMINAL AORTOGRAM N/A 09/07/2017   Procedure: ABDOMINAL AORTOGRAM;  Surgeon: Waynetta Sandy, MD;  Location: [facility];  Service: Cardiovascular;  Laterality: N/A;  . ABDOMINAL AORTOGRAM W/LOWER EXTREMITY N/A 02/03/2017   Procedure: Abdominal Aortogram w/Lower Extremity;  Surgeon: Waynetta Sandy, MD;  Location: [facility];  Service: Cardiovascular;  Laterality: N/A;  . ABDOMINAL AORTOGRAM W/LOWER EXTREMITY N/A 06/27/2018   Procedure: ABDOMINAL AORTOGRAM W/LOWER EXTREMITY;  Surgeon: Serafina Mitchell, MD;  Location: [facility];  Service: Cardiovascular;  Laterality: N/A;  . AMPUTATION Left 02/10/2017   Procedure: AMPUTATION TOES 3, 4 AND 5  LEFT FOOT;  Surgeon: Waynetta Sandy, MD;  Location: [facility];  Service: Vascular;  Laterality: Left;  . AMPUTATION Left 12/21/2017   Procedure: LEFT FOOT 5TH RAY AMPUTATION;  Surgeon: Newt Minion, MD;  Location: [facility];  Service: Orthopedics;  Laterality: Left;  . AMPUTATION Left 02/01/2018   Procedure: LEFT BELOW KNEE AMPUTATION;  Surgeon: Newt Minion, MD;  Location: [facility];  Service: Orthopedics;  Laterality: Left;  .  AMPUTATION Right 08/11/2018   Procedure: AMPUTATION BELOW KNEE;  Surgeon: Newt Minion, MD;  Location: [facility];  Service: Orthopedics;  Laterality: Right;  . CESAREAN SECTION    . CHOLECYSTECTOMY    . LOWER EXTREMITY ANGIOGRAPHY Bilateral 09/07/2017   Procedure: Lower Extremity Angiography;  Surgeon: Waynetta Sandy, MD;  Location: [facility];  Service: Cardiovascular;  Laterality: Bilateral;  . PERIPHERAL VASCULAR ATHERECTOMY  02/03/2017   Procedure: Peripheral Vascular Atherectomy;  Surgeon: Waynetta Sandy, MD;  Location: [facility];  Service: Cardiovascular;;  . PERIPHERAL VASCULAR ATHERECTOMY Right 06/27/2018   Procedure: PERIPHERAL VASCULAR ATHERECTOMY;  Surgeon: Serafina Mitchell, MD;  Location: [facility];  Service: Cardiovascular;  Laterality: Right;  superficial femoral  . PERIPHERAL VASCULAR BALLOON ANGIOPLASTY  02/03/2017   Procedure: Peripheral Vascular Balloon Angioplasty;  Surgeon: Waynetta Sandy, MD;  Location: [facility];  Service: Cardiovascular;;  . PERIPHERAL VASCULAR INTERVENTION Left 09/07/2017   Procedure: PERIPHERAL VASCULAR INTERVENTION;  Surgeon: Waynetta Sandy, MD;  Location: [facility];  Service: Cardiovascular;  Laterality: Left;  SFA/POPLITEAL  . STUMP REVISION Left 05/24/2018  . STUMP REVISION Left 05/24/2018   Procedure: REVISION LEFT BELOW KNEE AMPUTATION;  Surgeon: Newt Minion, MD;  Location: [facility];  Service: Orthopedics;  Laterality: Left;  . TUBAL LIGATION     Social History   Occupational History  . Not on file  Tobacco Use  . Smoking status: Current Every Day Smoker    Packs/day: 1.00    Years: 28.00    Pack years: 28.00    Types: Cigarettes  . Smokeless tobacco: Never Used  Substance and Sexual Activity  . Alcohol use: No  . Drug use: No  . Sexual activity: Yes    Birth control/protection: None

## 2018-09-18 ENCOUNTER — Telehealth (INDEPENDENT_AMBULATORY_CARE_PROVIDER_SITE_OTHER): Payer: Self-pay | Admitting: Orthopedic Surgery

## 2018-09-18 NOTE — Telephone Encounter (Signed)
Pt is s/p a right BKA requesting perocet refill last refilled on  08/11/18 09/05/18 #30 please advise.

## 2018-09-18 NOTE — Telephone Encounter (Signed)
Patient called requesting refill on Percocet 5  Please cal patient to advise.  [phone]

## 2018-09-19 ENCOUNTER — Other Ambulatory Visit (INDEPENDENT_AMBULATORY_CARE_PROVIDER_SITE_OTHER): Payer: Self-pay

## 2018-09-19 MED ORDER — OXYCODONE-ACETAMINOPHEN 5-325 MG PO TABS: 1.0000 | ORAL_TABLET | Freq: Four times a day (QID) | ORAL | 0 refills | Status: DC | PRN

## 2018-09-19 NOTE — Telephone Encounter (Signed)
Called and lm on vm for pt to advise rx will be ready for pick up this afternoon. Holding for Dr. Jess Barters signature will log at the front desk once this has been signed.

## 2018-09-19 NOTE — Telephone Encounter (Signed)
Ok refill? 

## 2018-10-05 ENCOUNTER — Ambulatory Visit (INDEPENDENT_AMBULATORY_CARE_PROVIDER_SITE_OTHER): Payer: Medicaid Other | Admitting: Physician Assistant

## 2018-10-18 ENCOUNTER — Telehealth (INDEPENDENT_AMBULATORY_CARE_PROVIDER_SITE_OTHER): Payer: Self-pay | Admitting: *Deleted

## 2018-10-18 NOTE — Telephone Encounter (Signed)
Pt called would like a refill on pain medication today if possible. States she was in to get her wound vac and staples out and noticed a couple days ago that she started hurting more and pulle off her sock and noticed a scab and she pulled back the scab and saw something shiny and realized it was a staple, says now she is having pain and redness. Pt has appt to come in on 10/19/18 to have the staple removed but would like to get some pain meds to get her thru the night.   Please advise.  [phone]

## 2018-10-18 NOTE — Telephone Encounter (Signed)
I called pt and advised that Dr. Sharol Given needs to see her in the office to eval and can discuss medication at that time.

## 2018-10-19 ENCOUNTER — Ambulatory Visit (INDEPENDENT_AMBULATORY_CARE_PROVIDER_SITE_OTHER): Payer: Medicaid Other | Admitting: Physician Assistant

## 2018-11-14 ENCOUNTER — Other Ambulatory Visit (HOSPITAL_COMMUNITY): Payer: Self-pay | Admitting: Nurse Practitioner

## 2018-11-14 ENCOUNTER — Other Ambulatory Visit: Payer: Self-pay | Admitting: Nurse Practitioner

## 2018-11-14 DIAGNOSIS — R3911 Hesitancy of micturition: Secondary | ICD-10-CM

## 2018-11-14 DIAGNOSIS — R609 Edema, unspecified: Secondary | ICD-10-CM

## 2018-11-17 ENCOUNTER — Ambulatory Visit (HOSPITAL_COMMUNITY): Payer: Medicaid Other

## 2018-11-17 ENCOUNTER — Ambulatory Visit (HOSPITAL_COMMUNITY): Admission: RE | Admit: 2018-11-17 | Payer: Medicaid Other | Source: Ambulatory Visit

## 2018-11-17 ENCOUNTER — Encounter (HOSPITAL_COMMUNITY): Payer: Self-pay

## 2018-11-21 ENCOUNTER — Encounter: Payer: Self-pay | Admitting: Radiology

## 2018-11-23 ENCOUNTER — Emergency Department (HOSPITAL_COMMUNITY): Payer: Medicaid Other

## 2018-11-23 ENCOUNTER — Emergency Department (HOSPITAL_COMMUNITY)
Admission: EM | Admit: 2018-11-23 | Discharge: 2018-11-23 | Disposition: A | Discharge status: Home / Self Care | Payer: Medicaid Other | Attending: Emergency Medicine | Admitting: Emergency Medicine

## 2018-11-23 DIAGNOSIS — R079 Chest pain, unspecified: Secondary | ICD-10-CM | POA: Diagnosis present

## 2018-11-23 DIAGNOSIS — Z5321 Procedure and treatment not carried out due to patient leaving prior to being seen by health care provider: Secondary | ICD-10-CM | POA: Insufficient documentation

## 2018-11-23 LAB — CBC
HEMATOCRIT: 27.3 % — AB (ref 36.0–46.0)
Hemoglobin: 9.2 g/dL — ABNORMAL LOW (ref 12.0–15.0)
MCH: 31.2 pg (ref 26.0–34.0)
MCHC: 33.7 g/dL (ref 30.0–36.0)
MCV: 92.5 fL (ref 80.0–100.0)
Platelets: 175 10*3/uL (ref 150–400)
RBC: 2.95 MIL/uL — ABNORMAL LOW (ref 3.87–5.11)
RDW: 12.6 % (ref 11.5–15.5)
WBC: 8.8 10*3/uL (ref 4.0–10.5)
nRBC: 0 % (ref 0.0–0.2)

## 2018-11-23 LAB — I-STAT TROPONIN, ED: Troponin i, poc: 0.04 ng/mL (ref 0.00–0.08)

## 2018-11-23 LAB — I-STAT BETA HCG BLOOD, ED (MC, WL, AP ONLY): I-stat hCG, quantitative: <5 m[IU]/mL (ref <5)

## 2018-11-23 LAB — BASIC METABOLIC PANEL
ANION GAP: 11 (ref 5–15)
BUN: 28 mg/dL — ABNORMAL HIGH (ref 6–20)
CO2: 19 mmol/L — ABNORMAL LOW (ref 22–32)
Calcium: 8.2 mg/dL — ABNORMAL LOW (ref 8.9–10.3)
Chloride: 106 mmol/L (ref 98–111)
Creatinine, Ser: 2.71 mg/dL — ABNORMAL HIGH (ref 0.44–1.00)
GFR calc Af Amer: 25 mL/min — ABNORMAL LOW (ref >60)
GFR calc non Af Amer: 21 mL/min — ABNORMAL LOW (ref >60)
GLUCOSE: 348 mg/dL — AB (ref 70–99)
Potassium: 3.8 mmol/L (ref 3.5–5.1)
Sodium: 136 mmol/L (ref 135–145)

## 2018-11-23 MED ORDER — SODIUM CHLORIDE 0.9% FLUSH: 3.0000 mL | Freq: Once | INTRAVENOUS | Status: DC

## 2018-11-23 NOTE — ED Notes (Signed)
Pt states she is leaving  

## 2018-11-23 NOTE — ED Triage Notes (Signed)
BIB EMS from home, pt reports CP onset 1hr PTA. Given 3NTG, 324 ASA. Now pain free. CBG 400's which pt states "is good for her" Given approx 578ml NS.

## 2018-12-05 ENCOUNTER — Encounter: Payer: Self-pay | Admitting: Obstetrics & Gynecology

## 2018-12-05 NOTE — Progress Notes (Deleted)
   Patient did not show up today for her scheduled appointment.   Nakeitha Milligan, MD, FACOG Obstetrician & Gynecologist, [facility], [facility]  

## 2019-02-07 ENCOUNTER — Inpatient Hospital Stay (HOSPITAL_COMMUNITY): Payer: Medicaid Other

## 2019-02-07 ENCOUNTER — Other Ambulatory Visit: Payer: Self-pay

## 2019-02-07 ENCOUNTER — Inpatient Hospital Stay (HOSPITAL_COMMUNITY)
Admission: EM | Admit: 2019-02-07 | Discharge: 2019-02-08 | DRG: 683 | Discharge status: Against Medical Advice | Payer: Medicaid Other | Attending: Internal Medicine | Admitting: Internal Medicine

## 2019-02-07 ENCOUNTER — Encounter (HOSPITAL_COMMUNITY): Payer: Self-pay

## 2019-02-07 DIAGNOSIS — Z79899 Other long term (current) drug therapy: Secondary | ICD-10-CM

## 2019-02-07 DIAGNOSIS — Z885 Allergy status to narcotic agent status: Secondary | ICD-10-CM

## 2019-02-07 DIAGNOSIS — Z5329 Procedure and treatment not carried out because of patient's decision for other reasons: Secondary | ICD-10-CM | POA: Diagnosis not present

## 2019-02-07 DIAGNOSIS — D631 Anemia in chronic kidney disease: Secondary | ICD-10-CM | POA: Diagnosis present

## 2019-02-07 DIAGNOSIS — J45909 Unspecified asthma, uncomplicated: Secondary | ICD-10-CM | POA: Diagnosis present

## 2019-02-07 DIAGNOSIS — E878 Other disorders of electrolyte and fluid balance, not elsewhere classified: Secondary | ICD-10-CM | POA: Diagnosis present

## 2019-02-07 DIAGNOSIS — Z7902 Long term (current) use of antithrombotics/antiplatelets: Secondary | ICD-10-CM

## 2019-02-07 DIAGNOSIS — E1122 Type 2 diabetes mellitus with diabetic chronic kidney disease: Secondary | ICD-10-CM | POA: Diagnosis present

## 2019-02-07 DIAGNOSIS — R45851 Suicidal ideations: Secondary | ICD-10-CM | POA: Diagnosis not present

## 2019-02-07 DIAGNOSIS — F419 Anxiety disorder, unspecified: Secondary | ICD-10-CM | POA: Diagnosis present

## 2019-02-07 DIAGNOSIS — F209 Schizophrenia, unspecified: Secondary | ICD-10-CM | POA: Diagnosis present

## 2019-02-07 DIAGNOSIS — T383X6A Underdosing of insulin and oral hypoglycemic [antidiabetic] drugs, initial encounter: Secondary | ICD-10-CM | POA: Diagnosis present

## 2019-02-07 DIAGNOSIS — R339 Retention of urine, unspecified: Secondary | ICD-10-CM | POA: Diagnosis present

## 2019-02-07 DIAGNOSIS — E785 Hyperlipidemia, unspecified: Secondary | ICD-10-CM | POA: Diagnosis present

## 2019-02-07 DIAGNOSIS — Z91128 Patient's intentional underdosing of medication regimen for other reason: Secondary | ICD-10-CM

## 2019-02-07 DIAGNOSIS — Z720 Tobacco use: Secondary | ICD-10-CM | POA: Diagnosis not present

## 2019-02-07 DIAGNOSIS — F319 Bipolar disorder, unspecified: Secondary | ICD-10-CM | POA: Diagnosis present

## 2019-02-07 DIAGNOSIS — Z9114 Patient's other noncompliance with medication regimen: Secondary | ICD-10-CM | POA: Diagnosis not present

## 2019-02-07 DIAGNOSIS — E1165 Type 2 diabetes mellitus with hyperglycemia: Secondary | ICD-10-CM

## 2019-02-07 DIAGNOSIS — I129 Hypertensive chronic kidney disease with stage 1 through stage 4 chronic kidney disease, or unspecified chronic kidney disease: Secondary | ICD-10-CM | POA: Diagnosis present

## 2019-02-07 DIAGNOSIS — K219 Gastro-esophageal reflux disease without esophagitis: Secondary | ICD-10-CM | POA: Diagnosis present

## 2019-02-07 DIAGNOSIS — E872 Acidosis: Secondary | ICD-10-CM | POA: Diagnosis present

## 2019-02-07 DIAGNOSIS — Z89432 Acquired absence of left foot: Secondary | ICD-10-CM

## 2019-02-07 DIAGNOSIS — F329 Major depressive disorder, single episode, unspecified: Secondary | ICD-10-CM | POA: Diagnosis not present

## 2019-02-07 DIAGNOSIS — E11649 Type 2 diabetes mellitus with hypoglycemia without coma: Secondary | ICD-10-CM | POA: Diagnosis not present

## 2019-02-07 DIAGNOSIS — F17210 Nicotine dependence, cigarettes, uncomplicated: Secondary | ICD-10-CM | POA: Diagnosis present

## 2019-02-07 DIAGNOSIS — Z8673 Personal history of transient ischemic attack (TIA), and cerebral infarction without residual deficits: Secondary | ICD-10-CM

## 2019-02-07 DIAGNOSIS — IMO0002 Reserved for concepts with insufficient information to code with codable children: Secondary | ICD-10-CM | POA: Diagnosis present

## 2019-02-07 DIAGNOSIS — N189 Chronic kidney disease, unspecified: Secondary | ICD-10-CM

## 2019-02-07 DIAGNOSIS — Z9119 Patient's noncompliance with other medical treatment and regimen: Secondary | ICD-10-CM | POA: Diagnosis not present

## 2019-02-07 DIAGNOSIS — Z9049 Acquired absence of other specified parts of digestive tract: Secondary | ICD-10-CM

## 2019-02-07 DIAGNOSIS — Z89512 Acquired absence of left leg below knee: Secondary | ICD-10-CM

## 2019-02-07 DIAGNOSIS — N183 Chronic kidney disease, stage 3 (moderate): Secondary | ICD-10-CM | POA: Diagnosis present

## 2019-02-07 DIAGNOSIS — Z886 Allergy status to analgesic agent status: Secondary | ICD-10-CM

## 2019-02-07 DIAGNOSIS — N179 Acute kidney failure, unspecified: Secondary | ICD-10-CM | POA: Diagnosis present

## 2019-02-07 DIAGNOSIS — I10 Essential (primary) hypertension: Secondary | ICD-10-CM | POA: Diagnosis not present

## 2019-02-07 DIAGNOSIS — Z89519 Acquired absence of unspecified leg below knee: Secondary | ICD-10-CM | POA: Diagnosis not present

## 2019-02-07 DIAGNOSIS — E1129 Type 2 diabetes mellitus with other diabetic kidney complication: Secondary | ICD-10-CM | POA: Diagnosis present

## 2019-02-07 DIAGNOSIS — E875 Hyperkalemia: Secondary | ICD-10-CM | POA: Diagnosis not present

## 2019-02-07 DIAGNOSIS — Z7982 Long term (current) use of aspirin: Secondary | ICD-10-CM

## 2019-02-07 DIAGNOSIS — Z8249 Family history of ischemic heart disease and other diseases of the circulatory system: Secondary | ICD-10-CM

## 2019-02-07 DIAGNOSIS — Z794 Long term (current) use of insulin: Secondary | ICD-10-CM

## 2019-02-07 DIAGNOSIS — Y92009 Unspecified place in unspecified non-institutional (private) residence as the place of occurrence of the external cause: Secondary | ICD-10-CM

## 2019-02-07 DIAGNOSIS — Z89511 Acquired absence of right leg below knee: Secondary | ICD-10-CM

## 2019-02-07 DIAGNOSIS — Z9851 Tubal ligation status: Secondary | ICD-10-CM

## 2019-02-07 DIAGNOSIS — T465X6A Underdosing of other antihypertensive drugs, initial encounter: Secondary | ICD-10-CM | POA: Diagnosis present

## 2019-02-07 DIAGNOSIS — Z833 Family history of diabetes mellitus: Secondary | ICD-10-CM

## 2019-02-07 DIAGNOSIS — F32A Depression, unspecified: Secondary | ICD-10-CM

## 2019-02-07 LAB — ACETAMINOPHEN LEVEL: Acetaminophen (Tylenol), Serum: <10 ug/mL — ABNORMAL LOW (ref 10–30)

## 2019-02-07 LAB — COMPREHENSIVE METABOLIC PANEL
ALT: 6 U/L (ref 0–44)
ALT: 6 U/L (ref 0–44)
AST: 12 U/L — ABNORMAL LOW (ref 15–41)
AST: 9 U/L — ABNORMAL LOW (ref 15–41)
Albumin: 3.2 g/dL — ABNORMAL LOW (ref 3.5–5.0)
Albumin: 2.9 g/dL — ABNORMAL LOW (ref 3.5–5.0)
Alkaline Phosphatase: 59 U/L (ref 38–126)
Alkaline Phosphatase: 52 U/L (ref 38–126)
Anion gap: 7 (ref 5–15)
Anion gap: 3 — ABNORMAL LOW (ref 5–15)
BUN: 36 mg/dL — ABNORMAL HIGH (ref 6–20)
BUN: 37 mg/dL — ABNORMAL HIGH (ref 6–20)
CO2: 15 mmol/L — ABNORMAL LOW (ref 22–32)
CO2: 15 mmol/L — ABNORMAL LOW (ref 22–32)
Calcium: 8.2 mg/dL — ABNORMAL LOW (ref 8.9–10.3)
Calcium: 7.9 mg/dL — ABNORMAL LOW (ref 8.9–10.3)
Chloride: 115 mmol/L — ABNORMAL HIGH (ref 98–111)
Chloride: 120 mmol/L — ABNORMAL HIGH (ref 98–111)
Creatinine, Ser: 3.77 mg/dL — ABNORMAL HIGH (ref 0.44–1.00)
Creatinine, Ser: 3.48 mg/dL — ABNORMAL HIGH (ref 0.44–1.00)
GFR calc Af Amer: 17 mL/min — ABNORMAL LOW (ref >60)
GFR calc Af Amer: 18 mL/min — ABNORMAL LOW (ref >60)
GFR calc non Af Amer: 14 mL/min — ABNORMAL LOW (ref >60)
GFR calc non Af Amer: 16 mL/min — ABNORMAL LOW (ref >60)
Glucose, Bld: 166 mg/dL — ABNORMAL HIGH (ref 70–99)
Glucose, Bld: 109 mg/dL — ABNORMAL HIGH (ref 70–99)
Potassium: 4.8 mmol/L (ref 3.5–5.1)
Potassium: 4.5 mmol/L (ref 3.5–5.1)
Sodium: 137 mmol/L (ref 135–145)
Sodium: 138 mmol/L (ref 135–145)
Total Bilirubin: 0.1 mg/dL — ABNORMAL LOW (ref 0.3–1.2)
Total Bilirubin: 0.2 mg/dL — ABNORMAL LOW (ref 0.3–1.2)
Total Protein: 6.4 g/dL — ABNORMAL LOW (ref 6.5–8.1)
Total Protein: 5.8 g/dL — ABNORMAL LOW (ref 6.5–8.1)

## 2019-02-07 LAB — CBC
HCT: 24.7 % — ABNORMAL LOW (ref 36.0–46.0)
Hemoglobin: 7.7 g/dL — ABNORMAL LOW (ref 12.0–15.0)
MCH: 29.8 pg (ref 26.0–34.0)
MCHC: 31.2 g/dL (ref 30.0–36.0)
MCV: 95.7 fL (ref 80.0–100.0)
Platelets: 193 10*3/uL (ref 150–400)
RBC: 2.58 MIL/uL — ABNORMAL LOW (ref 3.87–5.11)
RDW: 12.4 % (ref 11.5–15.5)
WBC: 9.5 10*3/uL (ref 4.0–10.5)
nRBC: 0 % (ref 0.0–0.2)

## 2019-02-07 LAB — GLUCOSE, CAPILLARY
Glucose-Capillary: 72 mg/dL (ref 70–99)
Glucose-Capillary: 82 mg/dL (ref 70–99)
Glucose-Capillary: 64 mg/dL — ABNORMAL LOW (ref 70–99)
Glucose-Capillary: 59 mg/dL — ABNORMAL LOW (ref 70–99)
Glucose-Capillary: 81 mg/dL (ref 70–99)
Glucose-Capillary: 90 mg/dL (ref 70–99)

## 2019-02-07 LAB — CBC WITH DIFFERENTIAL/PLATELET
Abs Immature Granulocytes: 0.05 10*3/uL (ref 0.00–0.07)
Basophils Absolute: 0.1 10*3/uL (ref 0.0–0.1)
Basophils Relative: 1 %
Eosinophils Absolute: 0.4 10*3/uL (ref 0.0–0.5)
Eosinophils Relative: 4 %
HCT: 28 % — ABNORMAL LOW (ref 36.0–46.0)
Hemoglobin: 9.1 g/dL — ABNORMAL LOW (ref 12.0–15.0)
Immature Granulocytes: 1 %
Lymphocytes Relative: 27 %
Lymphs Abs: 2.8 10*3/uL (ref 0.7–4.0)
MCH: 30.6 pg (ref 26.0–34.0)
MCHC: 32.5 g/dL (ref 30.0–36.0)
MCV: 94.3 fL (ref 80.0–100.0)
Monocytes Absolute: 0.6 10*3/uL (ref 0.1–1.0)
Monocytes Relative: 5 %
Neutro Abs: 6.5 10*3/uL (ref 1.7–7.7)
Neutrophils Relative %: 62 %
Platelets: 228 10*3/uL (ref 150–400)
RBC: 2.97 MIL/uL — ABNORMAL LOW (ref 3.87–5.11)
RDW: 12.6 % (ref 11.5–15.5)
WBC: 10.4 10*3/uL (ref 4.0–10.5)
nRBC: 0 % (ref 0.0–0.2)

## 2019-02-07 LAB — CBG MONITORING, ED: Glucose-Capillary: 135 mg/dL — ABNORMAL HIGH (ref 70–99)

## 2019-02-07 LAB — I-STAT BETA HCG BLOOD, ED (MC, WL, AP ONLY): I-stat hCG, quantitative: <5 m[IU]/mL (ref <5)

## 2019-02-07 LAB — ETHANOL: Alcohol, Ethyl (B): <10 mg/dL (ref <10)

## 2019-02-07 LAB — SALICYLATE LEVEL: Salicylate Lvl: <7 mg/dL (ref 2.8–30.0)

## 2019-02-07 MED ORDER — ONDANSETRON HCL 4 MG/2ML IJ SOLN: 4.0000 mg | Freq: Four times a day (QID) | INTRAMUSCULAR | Status: DC | PRN

## 2019-02-07 MED ORDER — CLONIDINE HCL 0.1 MG PO TABS
0.1000 mg | ORAL_TABLET | Freq: Two times a day (BID) | ORAL | Status: DC
  Administered 2019-02-07 – 2019-02-08 (×3): 0.1 mg via ORAL
  Filled 2019-02-07 (×4): qty 1

## 2019-02-07 MED ORDER — GABAPENTIN 300 MG PO CAPS
300.0000 mg | ORAL_CAPSULE | Freq: Two times a day (BID) | ORAL | Status: DC
  Administered 2019-02-08: 300 mg via ORAL
  Filled 2019-02-07 (×2): qty 1

## 2019-02-07 MED ORDER — INSULIN ASPART 100 UNIT/ML ~~LOC~~ SOLN: 0.0000 [IU] | Freq: Three times a day (TID) | SUBCUTANEOUS | Status: DC

## 2019-02-07 MED ORDER — ALBUTEROL SULFATE HFA 108 (90 BASE) MCG/ACT IN AERS
2.0000 | INHALATION_SPRAY | Freq: Four times a day (QID) | RESPIRATORY_TRACT | Status: DC | PRN
  Filled 2019-02-07: qty 6.7

## 2019-02-07 MED ORDER — PENTOXIFYLLINE ER 400 MG PO TBCR
400.0000 mg | EXTENDED_RELEASE_TABLET | Freq: Three times a day (TID) | ORAL | Status: DC
  Administered 2019-02-07 – 2019-02-08 (×3): 400 mg via ORAL
  Filled 2019-02-07 (×6): qty 1

## 2019-02-07 MED ORDER — SODIUM CHLORIDE 0.9 % IV SOLN
INTRAVENOUS | Status: DC
  Administered 2019-02-07 – 2019-02-08 (×2): via INTRAVENOUS

## 2019-02-07 MED ORDER — ALPRAZOLAM 1 MG PO TABS: 2.0000 mg | ORAL_TABLET | Freq: Three times a day (TID) | ORAL | Status: DC | PRN

## 2019-02-07 MED ORDER — GLUCOSE 40 % PO GEL
ORAL | Status: AC
  Administered 2019-02-07: 37.5 g
  Filled 2019-02-07: qty 1

## 2019-02-07 MED ORDER — ATORVASTATIN CALCIUM 40 MG PO TABS
80.0000 mg | ORAL_TABLET | Freq: Every day | ORAL | Status: DC
  Filled 2019-02-07: qty 2

## 2019-02-07 MED ORDER — TAMSULOSIN HCL 0.4 MG PO CAPS
0.4000 mg | ORAL_CAPSULE | Freq: Every day | ORAL | Status: DC
  Administered 2019-02-08: 12:00:00 0.4 mg via ORAL
  Filled 2019-02-07: qty 1

## 2019-02-07 MED ORDER — INSULIN GLARGINE 100 UNIT/ML ~~LOC~~ SOLN
30.0000 [IU] | Freq: Every day | SUBCUTANEOUS | Status: DC
  Filled 2019-02-07 (×2): qty 0.3

## 2019-02-07 MED ORDER — CLONIDINE HCL 0.1 MG PO TABS: 0.1000 mg | ORAL_TABLET | Freq: Two times a day (BID) | ORAL | Status: DC

## 2019-02-07 MED ORDER — NICOTINE 21 MG/24HR TD PT24
21.0000 mg | MEDICATED_PATCH | Freq: Every day | TRANSDERMAL | Status: DC
  Filled 2019-02-07 (×2): qty 1

## 2019-02-07 MED ORDER — DULOXETINE HCL 60 MG PO CPEP
90.0000 mg | ORAL_CAPSULE | ORAL | Status: DC
  Administered 2019-02-07: 90 mg via ORAL
  Filled 2019-02-07: qty 1

## 2019-02-07 MED ORDER — ALBUTEROL SULFATE (2.5 MG/3ML) 0.083% IN NEBU: 2.5000 mg | INHALATION_SOLUTION | Freq: Four times a day (QID) | RESPIRATORY_TRACT | Status: DC | PRN

## 2019-02-07 MED ORDER — LACTATED RINGERS IV BOLUS
500.0000 mL | Freq: Once | INTRAVENOUS | Status: AC
  Administered 2019-02-07: 500 mL via INTRAVENOUS

## 2019-02-07 MED ORDER — ASPIRIN EC 81 MG PO TBEC
81.0000 mg | DELAYED_RELEASE_TABLET | Freq: Every day | ORAL | Status: DC
  Administered 2019-02-07 – 2019-02-08 (×2): 81 mg via ORAL
  Filled 2019-02-07 (×2): qty 1

## 2019-02-07 MED ORDER — LURASIDONE HCL 40 MG PO TABS
120.0000 mg | ORAL_TABLET | Freq: Every day | ORAL | Status: DC
  Administered 2019-02-07: 120 mg via ORAL
  Filled 2019-02-07: qty 3

## 2019-02-07 MED ORDER — SODIUM CHLORIDE 0.9 % IV BOLUS (SEPSIS): 1000.0000 mL | Freq: Once | INTRAVENOUS | Status: DC

## 2019-02-07 MED ORDER — PANTOPRAZOLE SODIUM 40 MG PO TBEC: 40.0000 mg | DELAYED_RELEASE_TABLET | Freq: Every day | ORAL | Status: DC | PRN

## 2019-02-07 MED ORDER — METHOCARBAMOL 500 MG PO TABS: 500.0000 mg | ORAL_TABLET | Freq: Four times a day (QID) | ORAL | Status: DC | PRN

## 2019-02-07 MED ORDER — MIRTAZAPINE 15 MG PO TABS
15.0000 mg | ORAL_TABLET | Freq: Every day | ORAL | Status: DC
  Administered 2019-02-07: 15 mg via ORAL
  Filled 2019-02-07: qty 1
  Filled 2019-02-07: qty 2

## 2019-02-07 MED ORDER — HEPARIN SODIUM (PORCINE) 5000 UNIT/ML IJ SOLN
5000.0000 [IU] | Freq: Three times a day (TID) | INTRAMUSCULAR | Status: DC
  Administered 2019-02-07 – 2019-02-08 (×3): 5000 [IU] via SUBCUTANEOUS
  Filled 2019-02-07 (×4): qty 1

## 2019-02-07 MED ORDER — INSULIN ASPART 100 UNIT/ML ~~LOC~~ SOLN: 0.0000 [IU] | Freq: Every day | SUBCUTANEOUS | Status: DC

## 2019-02-07 MED ORDER — LURASIDONE HCL 40 MG PO TABS
80.0000 mg | ORAL_TABLET | Freq: Every day | ORAL | Status: DC
  Filled 2019-02-07: qty 2

## 2019-02-07 MED ORDER — ONDANSETRON HCL 4 MG PO TABS: 4.0000 mg | ORAL_TABLET | Freq: Four times a day (QID) | ORAL | Status: DC | PRN

## 2019-02-07 MED ORDER — ALPRAZOLAM 1 MG PO TABS
2.0000 mg | ORAL_TABLET | Freq: Four times a day (QID) | ORAL | Status: DC | PRN
  Administered 2019-02-07: 02:00:00 2 mg via ORAL
  Filled 2019-02-07: qty 4

## 2019-02-07 MED ORDER — INSULIN GLARGINE 100 UNIT/ML ~~LOC~~ SOLN
20.0000 [IU] | Freq: Every day | SUBCUTANEOUS | Status: DC
  Administered 2019-02-07: 20 [IU] via SUBCUTANEOUS
  Filled 2019-02-07 (×2): qty 0.2

## 2019-02-07 MED ORDER — FLUTICASONE FUROATE-VILANTEROL 100-25 MCG/INH IN AEPB
1.0000 | INHALATION_SPRAY | Freq: Every day | RESPIRATORY_TRACT | Status: DC
  Filled 2019-02-07: qty 28

## 2019-02-07 MED ORDER — GABAPENTIN 400 MG PO CAPS
800.0000 mg | ORAL_CAPSULE | Freq: Every day | ORAL | Status: DC
  Administered 2019-02-07: 800 mg via ORAL
  Filled 2019-02-07: qty 2

## 2019-02-07 NOTE — ED Triage Notes (Addendum)
Per PTAR, patient coming from home with complaints of suicidal ideation. There was a family dispute that upset the patient so she was planning to take Gabapentin to end her life. Patient states that her kids arguing and saying they hate each other on top of being stuck in the house and recently losing her right leg has just been too much to handle. Patient was given option to go to jail or hospital, but she chose to go to hospital for her kids. Patient states that she did not take the medication, she just grabbed the bottle of medication.  Patient has a history of depression, bipolar disorder, and anxiety. Patient is noncompliant with medications due to not being able to get in with her physician.

## 2019-02-07 NOTE — ED Notes (Signed)
Patient has a history of hypertension and has not been able to take BP medications.

## 2019-02-07 NOTE — Progress Notes (Signed)
PHARMACY NOTE:   MEDICATION DOSAGES IN SETTING OF RENAL INSUFFICIENCY  Active inpatient orders include:  gabapentin 800 mg 5 x / day  lurasidone 120 mg q hs  According to admission medication history:  Patient has prescription on file for gabapentin with directions to take 800 mg BID but states her actual dosage is 800 mg 5 x /day.  She reported most recently taken ~ 3 months ago.  Patient self-reported lurasidone dosage of 120 mg q hs. She reported most recently taken ~ 3 months ago.   Elevated SCr noted: 3.48 today, est CrCl 22 mL/min. Recent PTA SCr was 2.71 on 11/23/18, est CrCl 31 mL/min.  Usual recommended maximum dosages in setting of renal insufficiency include the following:  Gabapentin:  Est CrCl 15 - 29 mL/min:  max 600 mg/day in 1 to 2 divided doses  Est CrCl 30 - 49 mL/min:  max 900 mg/day in 2 to 3 divided doses  Lurasidone:  Est CrCl < 50 mL/min:    max 80 mg/day  Recommend:  Consider reducing gabapentin dosage to 300 mg BID until SCr improves to baseline, then consider increasing to 300 mg TID.  Consider reducing lurasidone to 80 mg qhs    Clayburn Pert, PharmD, BCPS [phone] 02/07/2019  6:28 AM  Alert text-paged to attending MD Dr Ree Kida at [phone].

## 2019-02-07 NOTE — Progress Notes (Signed)
Pt. Holding 626 ml of fluid in bladder. She is requesting to try and void. MD made aware. Will do I&O. Pt. Refusing I&O. After 2 hours of attempting to void she voided 800 ml in bedpan. She states that it is common for her to void one time daily. MD made aware.

## 2019-02-07 NOTE — ED Provider Notes (Signed)
TIME SEEN: 12:48 AM  CHIEF COMPLAINT: Suicidal thoughts  HPI: Patient is a 38 year old female with history of hypertension, diabetes, hyperlipidemia, schizophrenia, bilateral BKA who presents to the emergency department with suicidal thoughts.  States initially that it was "a misunderstanding".  States that she got upset tonight and took her bottle of gabapentin and emptied the pills into her hand but states she never took them.  States that her significant other called 911.  She states she has been having passive suicidal thoughts where she feels like "I wish I just was not here".  She does tell me that she has having visual and auditory hallucinations for the past week and is hearing voices telling her to kill her self.  States she has thought about overdosing.  Denies any homicidal ideation.  Denies any illicit drug or alcohol use.  No medical complaints.  No fevers, cough, vomiting, diarrhea, new pain.  States she has been off of all of her medications for the past several weeks because no one can take her to see her primary care doctor.  ROS: See HPI Constitutional: no fever  Eyes: no drainage  ENT: no runny nose   Cardiovascular:  no chest pain  Resp: no SOB  GI: no vomiting GU: no dysuria Integumentary: no rash  Allergy: no hives  Musculoskeletal: no leg swelling  Neurological: no slurred speech ROS otherwise negative  PAST MEDICAL HISTORY/PAST SURGICAL HISTORY:  Past Medical History:  Diagnosis Date  . Anxiety   . Asthma   . Bipolar affective ([place])   . Complete miscarriage   . Constipation   . 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 Depression   . Diabetes mellitus    Type II  . Dyspnea     " only at night and I stop breathing in my sleep too for about the last three weeks"  . GERD (gastroesophageal reflux disease)   . Headache    "migraraines"  . Hyperlipidemia   . Hypertension   . Osteomyelitis ([place])    left foot  . Pregnancy complication     HELP Syndrome  . Renal disorder   . Schizophrenia ([place])   . Stroke [facility])    " mild stroke", memory loss- approx 2017    MEDICATIONS:  Prior to Admission medications   Medication Sig Start Date End Date Taking? Authorizing Provider  ADVAIR DISKUS 100-50 MCG/DOSE AEPB Inhale 1 puff into the lungs daily. 01/10/15   [provider]  alprazolam Duanne Moron) 2 MG tablet Take 1 tablet (2 mg total) by mouth 4 (four) times daily as needed for sleep. Patient taking differently: Take 2 mg by mouth 5 (five) times daily.  08/12/17   Debbe Odea, MD  aspirin EC 81 MG tablet Take 1 tablet (81 mg total) by mouth daily. 07/03/18 07/03/19  [name], MD  atorvastatin (LIPITOR) 80 MG tablet Take 1 tablet (80 mg total) by mouth daily at 6 PM. 07/03/18   [name], MD  busPIRone (BUSPAR) 15 MG tablet Take 15 mg by mouth 3 (three) times daily. 06/09/18   [provider]  cloNIDine (CATAPRES) 0.1 MG tablet Take 0.1 mg by mouth 2 (two) times daily. 05/12/18   [provider]  clopidogrel (PLAVIX) 75 MG tablet Take 1 tablet (75 mg total) by mouth daily with breakfast. Patient taking differently: Take 75 mg by mouth at bedtime.  09/08/17   Rhyne, Hulen Shouts, PA-C  DULoxetine (CYMBALTA) 30 MG capsule Take 90  mg by mouth every morning.  06/09/18   [provider]  gabapentin (NEURONTIN) 800 MG tablet Take 1 tablet (800 mg total) by mouth 2 (two) times daily. Patient taking differently: Take 800 mg by mouth 5 (five) times daily.  02/11/17   Hosie Poisson, MD  insulin glargine (LANTUS) 100 UNIT/ML injection Inject 0.15 mLs (15 Units total) into the skin at bedtime. 20 U in the morning and 30 U in the evening 08/15/18   Bonnielee Haff, MD  Insulin Syringe-Needle U-100 (INSULIN SYRINGE .5CC/31GX5/16") 31G X 5/16" 0.5 ML MISC 1 each by Does not apply route QID. 08/12/17   Gherghe, Vella Redhead, MD  LATUDA 120 MG TABS Take 120 mg by mouth at bedtime. 04/12/16   [provider]   methocarbamol (ROBAXIN) 500 MG tablet Take 1 tablet (500 mg total) by mouth every 6 (six) hours as needed for muscle spasms. 08/15/18   Bonnielee Haff, MD  metoCLOPramide (REGLAN) 5 MG tablet Take 1 tablet (5 mg total) by mouth 3 (three) times daily before meals. 09/08/17   Bonnielee Haff, MD  mirtazapine (REMERON) 15 MG tablet Take 15 mg by mouth at bedtime. 12/22/17   [provider]  nicotine (NICODERM CQ - DOSED IN MG/24 HOURS) 21 mg/24hr patch Place 1 patch (21 mg total) onto the skin daily. 02/12/17   Hosie Poisson, MD  nitroGLYCERIN (NITRO-DUR) 0.2 mg/hr patch Place 1 patch (0.2 mg total) onto the skin daily. 01/16/18   Newt Minion, MD  NOVOLOG 100 UNIT/ML injection Inject 0-30 Units into the skin 3 (three) times daily with meals. Sliding Scale:  >300 15 units < 300 do not use 08/12/17   Gherghe, Vella Redhead, MD  oxyCODONE-acetaminophen (PERCOCET/ROXICET) 5-325 MG tablet Take 1 tablet by mouth every 6 (six) hours as needed for moderate pain or severe pain. 09/19/18   Newt Minion, MD  pantoprazole (PROTONIX) 40 MG tablet Take 1 tablet (40 mg total) by mouth daily. Patient taking differently: Take 40 mg by mouth daily as needed (reflux).  02/11/17   Hosie Poisson, MD  pentoxifylline (TRENTAL) 400 MG CR tablet Take 1 tablet (400 mg total) by mouth 3 (three) times daily with meals. 01/16/18   Newt Minion, MD  polyethylene glycol [facility]) packet Take 17 g by mouth daily. 08/15/18   Bonnielee Haff, MD  PROAIR HFA 108 (90 BASE) MCG/ACT inhaler Inhale 2 puffs into the lungs every 6 (six) hours as needed for shortness of breath.  01/10/15   [provider]  silver sulfADIAZINE (SILVADENE) 1 % cream Apply 1 application topically daily. 04/20/18   Suzan Slick, NP  zolpidem (AMBIEN) 10 MG tablet Take 10 mg by mouth at bedtime as needed for sleep. 04/26/16   [provider]    ALLERGIES:  Allergies  Allergen Reactions  . Hydrocodone-Acetaminophen Hives  . Tylenol  [Acetaminophen] Itching and Swelling    Patient tolerated APAP 650 mg (06/29/18) as well as oxycodone/APAP 5-325 mg during 06/25/2018 admission.    SOCIAL HISTORY:  Social History   Tobacco Use  . Smoking status: Current Every Day Smoker    Packs/day: 1.00    Years: 28.00    Pack years: 28.00    Types: Cigarettes  . Smokeless tobacco: Never Used  Substance Use Topics  . Alcohol use: No    FAMILY HISTORY: Family History  Problem Relation Age of Onset  . Diabetes Mellitus II Mother   . Heart disease Mother   . Heart disease  Father     EXAM: BP (!) 188/111 (BP Location: Left Arm)   Pulse 96   Temp 98.3 F (36.8 C) (Oral)   Resp 18   Ht 5\' 9"  (1.753 m)   Wt 72.6 kg   SpO2 100%   BMI 23.63 kg/m  CONSTITUTIONAL: Alert and oriented and responds appropriately to questions.  Chronically ill-appearing, appears older than stated age, tearful HEAD: Normocephalic EYES: Conjunctivae clear, pupils appear equal, EOMI ENT: normal nose; moist mucous membranes NECK: Supple, no meningismus, no nuchal rigidity, no LAD  CARD: RRR; S1 and S2 appreciated; no murmurs, no clicks, no rubs, no gallops RESP: Normal chest excursion without splinting or tachypnea; breath sounds clear and equal bilaterally; no wheezes, no rhonchi, no rales, no hypoxia or respiratory distress, speaking full sentences ABD/GI: Normal bowel sounds; non-distended; soft, non-tender, no rebound, no guarding, no peritoneal signs, no hepatosplenomegaly BACK:  The back appears normal and is non-tender to palpation, there is no CVA tenderness EXT: Normal ROM in all joints; non-tender to palpation; no edema; normal capillary refill; no cyanosis, status post bilateral BKA with well-appearing stumps with no open wounds SKIN: Normal color for age and race; warm; no rash NEURO: Moves all extremities equally PSYCH: Patient is tearful.  She endorses passive suicidal thoughts of feeling like she wishes she was not here.  Also endorses  command hallucinations.  MEDICAL DECISION MAKING: Patient here with suicidal ideation, command hallucinations.  Have recommended that she talk to TTS who may recommend inpatient treatment.  She agrees to stay voluntarily at this time but is very concerned about "being admitted to the crazy house" because she does not want to leave her children.  I feel she could potentially benefit from inpatient treatment especially given she is having command hallucinations telling her to kill herself and had concerning gesture tonight where she dumped all of her gabapentin into her hand to ingest it.  Will obtain screening labs, urine.  Will consult TTS.  ED PROGRESS: Patient's labs show creatinine of 3.77.  Creatinine was 2.7 in February.  She has a GFR that has dropped from 21 down to 14.  She has a metabolic acidosis likely secondary to uremia and hyperchloremia.  I am concerned that this acute on chronic renal failure is because of poor control of her blood pressure and diabetes and feel she needs admission.  We will give her home dose of clonidine.  Will discuss with hospitalist for admission for acute on chronic renal failure.  2:17 AM Discussed patient's case with hospitalist, Dr. Jonelle Sidle.  I have recommended admission and patient (and family if present) agree with this plan. Admitting physician will place admission orders.   I reviewed all nursing notes, vitals, pertinent previous records, EKGs, lab and urine results, imaging (as available).     , Delice Bison, DO 02/07/19 [phone]

## 2019-02-07 NOTE — ED Notes (Signed)
CBG 135  

## 2019-02-07 NOTE — H&P (Signed)
History and Physical   Kathy Phelps VOZ:[id number] DOB: [birth year] DOA: 02/07/2019  Referring MD/NP/PA: Dr. Leonides Schanz  PCP: Patient, No Pcp Per   Outpatient Specialists: None  Patient coming from: Home  Chief Complaint: Suicidal ideation  HPI: Kathy Phelps is a 38 y.o. female with medical history significant of diabetes with noncompliance, schizophrenia, hypertension, hyperlipidemia, bilateral BKA who was brought to the emergency room with suicidal ideation.  Patient has not been taking her medications for her blood pressure and diabetes.  She apparently was upset tonight and took gabapentin and emptied the bottle into her house threatening to take them to kill herself but never did.  Significant other called 911 and she was brought to the emergency room.  Patient voiced suicidal ideation in the emergency room.  Also mention visual and auditory hallucinations consistent with her schizophrenia which has been going on for about a week.  Some of the voices have been telling her to kill herself.  She has thought about overdosing several times but did not move forward.  No homicidal ideation.  Patient has had a history of poor compliance with treatment.  High evaluation in the ER showed acute on chronic renal failure with baseline creatinine is around 2 but currently she is more than 3.  Psychiatric consultation was sought for however patient needs medical clearance with her worsening renal function.  She is therefore being admitted to the medical service.  She denied any shortness of breath, no cough no nausea vomiting or diarrhea..  ED Course: Her temperature is 98.3 blood pressure 188/111 pulse 96 respiratory rate of 18 oxygen sats 100% room air.  White count is 10.4 hemoglobin 9.1 with platelets 228.  Sodium 137 potassium 4.8 chloride 115 CO2 is 15 with BUN 36 and creatinine 3.77 calcium 8.2 and glucose 166.  Tylenol and salicylate levels were negative alcohol level negative.  Chest x-ray showed no  active disease.  Patient is being admitted therefore for medical clearance prior to psychiatric admission.  Review of Systems: As per HPI otherwise 10 point review of systems negative.    Past Medical History:  Diagnosis Date   Anxiety    Asthma    Bipolar affective ([place])    Complete miscarriage    Constipation    Dehiscence of amputation stump ([place])    left below knee   Depression    Diabetes mellitus    Type II   Dyspnea     " only at night and I stop breathing in my sleep too for about the last three weeks"   GERD (gastroesophageal reflux disease)    Headache    "migraraines"   Hyperlipidemia    Hypertension    Osteomyelitis ([place])    left foot   Pregnancy complication    HELP Syndrome   Renal disorder    Schizophrenia ([place])    Stroke ([place])    " mild stroke", memory loss- approx 2017    Past Surgical History:  Procedure Laterality Date   ABDOMINAL AORTOGRAM N/A 09/07/2017   Procedure: ABDOMINAL AORTOGRAM;  Surgeon: Waynetta Sandy, MD;  Location: [facility];  Service: Cardiovascular;  Laterality: N/A;   ABDOMINAL AORTOGRAM W/LOWER EXTREMITY N/A 02/03/2017   Procedure: Abdominal Aortogram w/Lower Extremity;  Surgeon: Waynetta Sandy, MD;  Location: [facility];  Service: Cardiovascular;  Laterality: N/A;   ABDOMINAL AORTOGRAM W/LOWER EXTREMITY N/A 06/27/2018   Procedure: ABDOMINAL AORTOGRAM W/LOWER EXTREMITY;  Surgeon: Serafina Mitchell, MD;  Location: [facility];  Service: Cardiovascular;  Laterality: N/A;   AMPUTATION Left 02/10/2017   Procedure: AMPUTATION TOES 3, 4 AND 5  LEFT FOOT;  Surgeon: Waynetta Sandy, MD;  Location: [facility];  Service: Vascular;  Laterality: Left;   AMPUTATION Left 12/21/2017   Procedure: LEFT FOOT 5TH RAY AMPUTATION;  Surgeon: Newt Minion, MD;  Location: [facility];  Service: Orthopedics;  Laterality: Left;   AMPUTATION Left 02/01/2018   Procedure: LEFT BELOW KNEE AMPUTATION;   Surgeon: Newt Minion, MD;  Location: [facility];  Service: Orthopedics;  Laterality: Left;   AMPUTATION Right 08/11/2018   Procedure: AMPUTATION BELOW KNEE;  Surgeon: Newt Minion, MD;  Location: [facility];  Service: Orthopedics;  Laterality: Right;   CESAREAN SECTION     CHOLECYSTECTOMY     LOWER EXTREMITY ANGIOGRAPHY Bilateral 09/07/2017   Procedure: Lower Extremity Angiography;  Surgeon: Waynetta Sandy, MD;  Location: [facility];  Service: Cardiovascular;  Laterality: Bilateral;   PERIPHERAL VASCULAR ATHERECTOMY  02/03/2017   Procedure: Peripheral Vascular Atherectomy;  Surgeon: Waynetta Sandy, MD;  Location: [facility];  Service: Cardiovascular;;   PERIPHERAL VASCULAR ATHERECTOMY Right 06/27/2018   Procedure: PERIPHERAL VASCULAR ATHERECTOMY;  Surgeon: Serafina Mitchell, MD;  Location: [facility];  Service: Cardiovascular;  Laterality: Right;  superficial femoral   PERIPHERAL VASCULAR BALLOON ANGIOPLASTY  02/03/2017   Procedure: Peripheral Vascular Balloon Angioplasty;  Surgeon: Waynetta Sandy, MD;  Location: [facility];  Service: Cardiovascular;;   PERIPHERAL VASCULAR INTERVENTION Left 09/07/2017   Procedure: PERIPHERAL VASCULAR INTERVENTION;  Surgeon: Waynetta Sandy, MD;  Location: [facility];  Service: Cardiovascular;  Laterality: Left;  SFA/POPLITEAL   STUMP REVISION Left 05/24/2018   STUMP REVISION Left 05/24/2018   Procedure: REVISION LEFT BELOW KNEE AMPUTATION;  Surgeon: Newt Minion, MD;  Location: [facility];  Service: Orthopedics;  Laterality: Left;   TUBAL LIGATION       reports that she has been smoking cigarettes. She has a 28.00 pack-year smoking history. She has never used smokeless tobacco. She reports that she does not drink alcohol or use drugs.  Allergies  Allergen Reactions   Hydrocodone-Acetaminophen Hives   Tylenol [Acetaminophen] Itching and Swelling    Patient tolerated APAP 650 mg  (06/29/18) as well as oxycodone/APAP 5-325 mg during 06/25/2018 admission.    Family History  Problem Relation Age of Onset   Diabetes Mellitus II Mother    Heart disease Mother    Heart disease Father      Prior to Admission medications   Medication Sig Start Date End Date Taking? Authorizing Provider  ADVAIR DISKUS 100-50 MCG/DOSE AEPB Inhale 1 puff into the lungs daily. 01/10/15   [provider]  alprazolam Duanne Moron) 2 MG tablet Take 1 tablet (2 mg total) by mouth 4 (four) times daily as needed for sleep. Patient taking differently: Take 2 mg by mouth 5 (five) times daily.  08/12/17   Debbe Odea, MD  aspirin EC 81 MG tablet Take 1 tablet (81 mg total) by mouth daily. 07/03/18 07/03/19  [name], MD  atorvastatin (LIPITOR) 80 MG tablet Take 1 tablet (80 mg total) by mouth daily at 6 PM. 07/03/18   [name], MD  cloNIDine (CATAPRES) 0.1 MG tablet Take 0.1 mg by mouth 2 (two) times daily. 05/12/18   [provider]  DULoxetine (CYMBALTA) 30 MG capsule Take 90 mg by mouth every morning.  06/09/18   [provider]  gabapentin (NEURONTIN) 800 MG tablet Take 1 tablet (800 mg total) by mouth 2 (two) times daily. Patient taking differently: Take 800 mg by mouth 5 (five) times daily.  02/11/17   Hosie Poisson, MD  insulin glargine (LANTUS) 100 UNIT/ML injection Inject 0.15 mLs (15 Units total) into the skin at bedtime. 20 U in the morning and 30 U in the evening 08/15/18   Bonnielee Haff, MD  Insulin Syringe-Needle U-100 (INSULIN SYRINGE .5CC/31GX5/16") 31G X 5/16" 0.5 ML MISC 1 each by Does not apply route QID. 08/12/17   Gherghe, Vella Redhead, MD  LATUDA 120 MG TABS Take 120 mg by mouth at bedtime. 04/12/16   [provider]  methocarbamol (ROBAXIN) 500 MG tablet Take 1 tablet (500 mg total) by mouth every 6 (six) hours as needed for muscle spasms. 08/15/18   Bonnielee Haff, MD  mirtazapine (REMERON) 15 MG tablet Take 15 mg by mouth at bedtime. 12/22/17    [provider]  nicotine (NICODERM CQ - DOSED IN MG/24 HOURS) 21 mg/24hr patch Place 1 patch (21 mg total) onto the skin daily. 02/12/17   Hosie Poisson, MD  NOVOLOG 100 UNIT/ML injection Inject 0-30 Units into the skin 3 (three) times daily with meals. Sliding Scale:  >300 15 units < 300 do not use 08/12/17   Gherghe, Vella Redhead, MD  pantoprazole (PROTONIX) 40 MG tablet Take 1 tablet (40 mg total) by mouth daily. Patient taking differently: Take 40 mg by mouth daily as needed (reflux).  02/11/17   Hosie Poisson, MD  pentoxifylline (TRENTAL) 400 MG CR tablet Take 1 tablet (400 mg total) by mouth 3 (three) times daily with meals. 01/16/18   Newt Minion, MD  PROAIR HFA 108 [phone] BASE) MCG/ACT inhaler Inhale 2 puffs into the lungs every 6 (six) hours as needed for shortness of breath.  01/10/15   [provider]    Physical Exam: Vitals:   02/07/19 0039 02/07/19 0040  BP:  (!) 188/111  Pulse:  96  Resp:  18  Temp:  98.3 F (36.8 C)  TempSrc:  Oral  SpO2:  100%  Weight: 72.6 kg   Height: 5\' 9"  (1.753 m)       Constitutional: NAD, calm, withdrawn, suicidal Vitals:   02/07/19 0039 02/07/19 0040  BP:  (!) 188/111  Pulse:  96  Resp:  18  Temp:  98.3 F (36.8 C)  TempSrc:  Oral  SpO2:  100%  Weight: 72.6 kg   Height: 5\' 9"  (1.753 m)    Eyes: PERRL, lids and conjunctivae normal ENMT: Mucous membranes are dry. Posterior pharynx clear of any exudate or lesions.Normal dentition.  Neck: normal, supple, no masses, no thyromegaly Respiratory: clear to auscultation bilaterally, no wheezing, no crackles. Normal respiratory effort. No accessory muscle use.  Cardiovascular: Regular rate and rhythm, no murmurs / rubs / gallops.  Abdomen: no tenderness, no masses palpated. No hepatosplenomegaly. Bowel sounds positive.  Musculoskeletal: Bilateral BKA.  Skin: no rashes, lesions, ulcers. No induration Neurologic: CN 2-12 grossly intact. Sensation intact, DTR normal. Strength 5/5 in all  4.  Psychiatric: Suicidal, depressed, occasionally in tears.     Labs on Admission: I have personally reviewed following labs and imaging studies  CBC: Recent Labs  Lab 02/07/19 0107  WBC 10.4  NEUTROABS 6.5  HGB 9.1*  HCT 28.0*  MCV 94.3  PLT 798   Basic Metabolic Panel: Recent Labs  Lab 02/07/19 0107  NA 137  K 4.8  CL 115*  CO2 15*  GLUCOSE 166*  BUN 36*  CREATININE 3.77*  CALCIUM 8.2*   GFR: Estimated Creatinine Clearance: 20.9 mL/min (A) (by C-G formula based on SCr of 3.77 mg/dL (H)). Liver Function Tests: Recent Labs  Lab 02/07/19 0107  AST 12*  ALT 6  ALKPHOS 59  BILITOT 0.1*  PROT 6.4*  ALBUMIN 3.2*   No results for input(s): LIPASE, AMYLASE in the last 168 hours. No results for input(s): AMMONIA in the last 168 hours. Coagulation Profile: No results for input(s): INR, PROTIME in the last 168 hours. Cardiac Enzymes: No results for input(s): CKTOTAL, CKMB, CKMBINDEX, TROPONINI in the last 168 hours. BNP (last 3 results) No results for input(s): PROBNP in the last 8760 hours. HbA1C: No results for input(s): HGBA1C in the last 72 hours. CBG: No results for input(s): GLUCAP in the last 168 hours. Lipid Profile: No results for input(s): CHOL, HDL, LDLCALC, TRIG, CHOLHDL, LDLDIRECT in the last 72 hours. Thyroid Function Tests: No results for input(s): TSH, T4TOTAL, FREET4, T3FREE, THYROIDAB in the last 72 hours. Anemia Panel: No results for input(s): VITAMINB12, FOLATE, FERRITIN, TIBC, IRON, RETICCTPCT in the last 72 hours. Urine analysis:    Component Value Date/Time   COLORURINE YELLOW 08/14/2018 1646   APPEARANCEUR CLEAR 08/14/2018 1646   LABSPEC 1.008 08/14/2018 1646   PHURINE 5.0 08/14/2018 1646   GLUCOSEU NEGATIVE 08/14/2018 1646   HGBUR MODERATE (A) 08/14/2018 1646   BILIRUBINUR NEGATIVE 08/14/2018 1646   KETONESUR NEGATIVE 08/14/2018 1646   PROTEINUR 30 (A) 08/14/2018 1646   UROBILINOGEN 1.0 02/26/2015 1152   NITRITE NEGATIVE  08/14/2018 1646   LEUKOCYTESUR NEGATIVE 08/14/2018 1646   Sepsis Labs: @LABRCNTIP (procalcitonin:4,lacticidven:4) )No results found for this or any previous visit (from the past 240 hour(s)).   Radiological Exams on Admission: No results found.  EKG: Independently reviewed.  It showed normal sinus rhythm with no significant changes.  Assessment/Plan Principal Problem:   Acute on chronic renal failure (HCC) Active Problems:   Type II diabetes mellitus with renal manifestations, uncontrolled (HCC)   HLD (hyperlipidemia)   Tobacco abuse   Essential hypertension   GERD (gastroesophageal reflux disease)   Bipolar 1 disorder (HCC)   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HCC)     #1 acute on chronic renal failure: Patient's baseline creatinine was 2.7.  She had chronic kidney disease stage III.  Currently 3.77.  Probably prerenal.  It could also be worsening of her chronic kidney disease.  She will be admitted and hydrated.  Follow renal function.  Avoid nephrotoxic medications.  If and when patient renal function stabilizes she will probably be admitted to inpatient psych.  #2 suicidal ideation: Patient will get psychiatric consultation.  She is relatively stable.  Able to communicate without a problem.  Has psych issues include schizophrenia and bipolar disorder.  #3 diabetes: Blood sugar is reasonably controlled although she has poor compliance.  Initiate sliding scale insulin and follow with monitoring.  #4 hypertension: Blood pressure is elevated tonight.  Initiate home regimen and adjust as necessary.  #5 hyperlipidemia: Resume home regimen and monitor closely.  #6 tobacco abuse: Tobacco cessation counseling.  Add nicotine patch.  #7 GERD: PPIs will be ordered.     DVT prophylaxis: Heparin Code Status: Full code Family Communication: No family at bedside but significant other Disposition Plan: Most likely inpatient psych Consults called: Psychiatry Admission status: Inpatient  Severity  of Illness: The appropriate patient status for this patient is INPATIENT. Inpatient status is judged to be reasonable and necessary in order to  provide the required intensity of service to ensure the patient's safety. The patient's presenting symptoms, physical exam findings, and initial radiographic and laboratory data in the context of their chronic comorbidities is felt to place them at high risk for further clinical deterioration. Furthermore, it is not anticipated that the patient will be medically stable for discharge from the hospital within 2 midnights of admission. The following factors support the patient status of inpatient.   " The patient's presenting symptoms include suicidal ideation. " The worrisome physical exam findings include confusion. " The initial radiographic and laboratory data are worrisome because of no significant finding on x-ray but renal failure. " The chronic co-morbidities include schizophrenia with bipolar disorder.   * I certify that at the point of admission it is my clinical judgment that the patient will require inpatient hospital care spanning beyond 2 midnights from the point of admission due to high intensity of service, high risk for further deterioration and high frequency of surveillance required.Barbette Merino MD [facility] Pager [phone]  If 7PM-7AM, please contact night-coverage [email or web address] Password [email or web address]  02/07/2019, 2:19 AM

## 2019-02-07 NOTE — Progress Notes (Signed)
Hypoglycemic Event  CBG: 64  Treatment: 1 tube glucose gel; followed by simple carb- grapes  Symptoms: None  Follow-up CBG: [id number] CBG Result:59 Follow up time 1711   CBG Result: 81  Possible Reasons for Event: Inadequate meal intake  Comments/MD notified:Yes    Eliot Ford

## 2019-02-07 NOTE — Progress Notes (Signed)
PROGRESS NOTE    Kathy Phelps  GXQ:[id number] DOB: April 13, 1981 DOA: 02/07/2019 PCP: Patient, No Pcp Per   Brief Narrative:  HPI on 02/07/2019 by Dr. Gala Romney Kathy Phelps is a 39 y.o. female with medical history significant of diabetes with noncompliance, schizophrenia, hypertension, hyperlipidemia, bilateral BKA who was brought to the emergency room with suicidal ideation.  Patient has not been taking her medications for her blood pressure and diabetes.  She apparently was upset tonight and took gabapentin and emptied the bottle into her house threatening to take them to kill herself but never did.  Significant other called 911 and she was brought to the emergency room.  Patient voiced suicidal ideation in the emergency room.  Also mention visual and auditory hallucinations consistent with her schizophrenia which has been going on for about a week.  Some of the voices have been telling her to kill herself.  She has thought about overdosing several times but did not move forward.  No homicidal ideation.  Patient has had a history of poor compliance with treatment.  High evaluation in the ER showed acute on chronic renal failure with baseline creatinine is around 2 but currently she is more than 3.  Psychiatric consultation was sought for however patient needs medical clearance with her worsening renal function.  She is therefore being admitted to the medical service.  She denied any shortness of breath, no cough no nausea vomiting or diarrhea. Assessment & Plan   Admitted earlier today by Dr. Jonelle Sidle. See full H&P for details.   Acute kidney injury on chronic kidney disease, stage III to IV -Creatinine on admission 3.77, currently down to 3.48 -Continue IV fluids -Upon review of patient's chart, creatinine was 2.71 in February 2020 however prior to that baseline creatinine approximate 1.7-1.9 -Will continue to monitor BMP closely  Suicidal ideation -Psychiatry consulted and appreciated  -History of schizophrenia and bipolar disorder -Continue Latuda, gabapentin (have renally adjusted doses), Remeron, Cymbalta  Chronic normocytic anemia/anemia of chronic disease -Hemoglobin 9.1 on admission, as above is dropped to 7.7 -Suspect drop in hemoglobin due to dilutional component -Continue to monitor CBC, transfuse if hemoglobin drops below 7  Diabetes mellitus, type II -Last hemoglobin A1c 8 on 08/11/2018 -Continue lantus, insulin sliding scale with CBG monitoring -will obtain repeat A1c  Essential hypertension -Continue clonidine  Hyperlipidemia -Continue statin  Tobacco abuse -Continue nicotine patch  GERD -Continue PPI   DVT Prophylaxis  Heparin  Code Status: Full  Family Communication: None at bedside.  Disposition Plan: Admitted today. Will likely be hospitalized for an additional 1-2 days given AKI. Will also obtain psychiatry consult.  Consultants Psychiatry  Procedures  None  Antibiotics   Anti-infectives (From admission, onward)   None      Subjective:   Kathy Phelps seen and examined today.  Feeling sleepy this morning. No complaints.  Objective:   Vitals:   02/07/19 0331 02/07/19 0505 02/07/19 0829 02/07/19 0831  BP: (!) 166/103 (!) 164/102    Pulse: 87 88    Resp: 18 12    Temp: 97.8 F (36.6 C) (!) 97.3 F (36.3 C)    TempSrc: Oral Oral    SpO2: 100% 100% 98% 98%  Weight: 70.1 kg     Height: 5\' 9"  (1.753 m)       Intake/Output Summary (Last 24 hours) at 02/07/2019 1116 Last data filed at 02/07/2019 0630 Gross per 24 hour  Intake 350.96 ml  Output -  Net 350.96 ml  Filed Weights   02/07/19 0039 02/07/19 0331  Weight: 72.6 kg 70.1 kg    Exam No exam, admitted earlier today.   Data Reviewed: I have personally reviewed following labs and imaging studies  CBC: Recent Labs  Lab 02/07/19 0107 02/07/19 0522  WBC 10.4 9.5  NEUTROABS 6.5  --   HGB 9.1* 7.7*  HCT 28.0* 24.7*  MCV 94.3 95.7  PLT 228 935    Basic Metabolic Panel: Recent Labs  Lab 02/07/19 0107 02/07/19 0522  NA 137 138  K 4.8 4.5  CL 115* 120*  CO2 15* 15*  GLUCOSE 166* 109*  BUN 36* 37*  CREATININE 3.77* 3.48*  CALCIUM 8.2* 7.9*   GFR: Estimated Creatinine Clearance: 22.7 mL/min (A) (by C-G formula based on SCr of 3.48 mg/dL (H)). Liver Function Tests: Recent Labs  Lab 02/07/19 0107 02/07/19 0522  AST 12* 9*  ALT 6 6  ALKPHOS 59 52  BILITOT 0.1* 0.2*  PROT 6.4* 5.8*  ALBUMIN 3.2* 2.9*   No results for input(s): LIPASE, AMYLASE in the last 168 hours. No results for input(s): AMMONIA in the last 168 hours. Coagulation Profile: No results for input(s): INR, PROTIME in the last 168 hours. Cardiac Enzymes: No results for input(s): CKTOTAL, CKMB, CKMBINDEX, TROPONINI in the last 168 hours. BNP (last 3 results) No results for input(s): PROBNP in the last 8760 hours. HbA1C: No results for input(s): HGBA1C in the last 72 hours. CBG: Recent Labs  Lab 02/07/19 0241 02/07/19 0848  GLUCAP 135* 72   Lipid Profile: No results for input(s): CHOL, HDL, LDLCALC, TRIG, CHOLHDL, LDLDIRECT in the last 72 hours. Thyroid Function Tests: No results for input(s): TSH, T4TOTAL, FREET4, T3FREE, THYROIDAB in the last 72 hours. Anemia Panel: No results for input(s): VITAMINB12, FOLATE, FERRITIN, TIBC, IRON, RETICCTPCT in the last 72 hours. Urine analysis:    Component Value Date/Time   COLORURINE YELLOW 08/14/2018 1646   APPEARANCEUR CLEAR 08/14/2018 1646   LABSPEC 1.008 08/14/2018 1646   PHURINE 5.0 08/14/2018 1646   GLUCOSEU NEGATIVE 08/14/2018 1646   HGBUR MODERATE (A) 08/14/2018 1646   BILIRUBINUR NEGATIVE 08/14/2018 1646   KETONESUR NEGATIVE 08/14/2018 1646   PROTEINUR 30 (A) 08/14/2018 1646   UROBILINOGEN 1.0 02/26/2015 1152   NITRITE NEGATIVE 08/14/2018 1646   LEUKOCYTESUR NEGATIVE 08/14/2018 1646   Sepsis Labs: @LABRCNTIP (procalcitonin:4,lacticidven:4)  )No results found for this or any previous visit  (from the past 240 hour(s)).    Radiology Studies: No results found.   Scheduled Meds: . aspirin EC  81 mg Oral Daily  . atorvastatin  80 mg Oral q1800  . cloNIDine  0.1 mg Oral BID  . DULoxetine  90 mg Oral BH-q7a  . fluticasone furoate-vilanterol  1 puff Inhalation Daily  . gabapentin  300 mg Oral BID  . heparin  5,000 Units Subcutaneous Q8H  . insulin aspart  0-30 Units Subcutaneous TID WC  . insulin aspart  0-5 Units Subcutaneous QHS  . insulin aspart  0-9 Units Subcutaneous TID WC  . insulin glargine  20 Units Subcutaneous Q breakfast  . insulin glargine  30 Units Subcutaneous QHS  . lurasidone  80 mg Oral QHS  . mirtazapine  15 mg Oral QHS  . nicotine  21 mg Transdermal Daily  . pentoxifylline  400 mg Oral TID WC   Continuous Infusions: . sodium chloride 125 mL/hr at 02/07/19 0339     LOS: 0 days   Time Spent in minutes   30 minutes  Kathy Phelps D.O.  on 02/07/2019 at 11:16 AM  Between 7am to 7pm - Please see pager noted on [email or web address]  After 7pm go to [email or web address]  And look for the night coverage person covering for me after hours  [facility] Office  [phone]

## 2019-02-07 NOTE — ED Notes (Signed)
Bed: EL40 Expected date:  Expected time:  Means of arrival:  Comments: Suicide attempt

## 2019-02-07 NOTE — TOC Initial Note (Signed)
Transition of Care [facility]) - Initial/Assessment Note    Patient Details  Name: Kathy Phelps MRN: [id number] Date of Birth: 1980/11/03  Transition of Care [facility]) CM/SW Contact:    Dessa Phi, RN Phone Number: 02/07/2019, 11:54 AM  Clinical Narrative:Referral for transportation-attempted to arouse patient to discuss d/c plans but unable to awaken-sleeping soundly while 1:1 in rm for SI. CM TC to [facility] transportation([phone]-they need to talk directly to patient for transportation needs.Noted psych called-await recc. Bilateral BKA-will assess if PTAR needed for home.                  Expected Discharge Plan: Home/Self Care Barriers to Discharge: No Barriers Identified   Patient Goals and CMS Choice        Expected Discharge Plan and Services Expected Discharge Plan: Home/Self Care   Discharge Planning Services: CM Consult   Living arrangements for the past 2 months: Single Family Home                                      Prior Living Arrangements/Services Living arrangements for the past 2 months: Single Family Home   Patient language and need for interpreter reviewed:: Yes Do you feel safe going back to the place where you live?: Yes      Need for Family Participation in Patient Care: No (Comment) Care giver support system in place?: Yes (comment) Current home services: DME(Bilateral leg prothesis) Criminal Activity/Legal Involvement Pertinent to Current Situation/Hospitalization: No - Comment as needed  Activities of Daily Living Home Assistive Devices/Equipment: CBG Meter, Wheelchair ADL Screening (condition at time of admission) Patient's cognitive ability adequate to safely complete daily activities?: Yes Is the patient deaf or have difficulty hearing?: No Does the patient have difficulty seeing, even when wearing glasses/contacts?: No Does the patient have difficulty concentrating, remembering, or making decisions?: No Patient able to  express need for assistance with ADLs?: Yes Does the patient have difficulty dressing or bathing?: No Independently performs ADLs?: Yes (appropriate for developmental age) Does the patient have difficulty walking or climbing stairs?: Yes Weakness of Legs: Both(Bilateral BKAs) Weakness of Arms/Hands: None  Permission Sought/Granted Permission sought to share information with : Case Manager Permission granted to share information with : Yes, Verbal Permission Granted  Share Information with NAME: Janalyn Shy     Permission granted to share info w Relationship: Friend  Permission granted to share info w Contact Information: [phone]  Emotional Assessment Appearance:: Appears stated age Attitude/Demeanor/Rapport: Other (comment)(sleeping-taken gabapentin.) Affect (typically observed): Unable to Assess Orientation: : Oriented to Self, Oriented to Place, Oriented to  Time, Oriented to Situation Alcohol / Substance Use: Tobacco Use Psych Involvement: Yes (comment)(consult placed for suicide ideation)  Admission diagnosis:  Suicidal ideation [R45.851] Acute renal failure superimposed on chronic kidney disease, unspecified CKD stage, unspecified acute renal failure type ([place]) [N17.9, N18.9] Patient Active Problem List   Diagnosis Date Noted  . 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[place]) 02/07/2019  . Acute on chronic renal failure ([place]) 02/07/2019  . 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[place]) 08/11/2018  . CKD stage 3 secondary to diabetes ([place]) 08/11/2018  . Uncontrolled diabetes mellitus type 2 with peripheral artery disease ([place]) 08/11/2018  . Gangrene of right foot ([place])   . Lower limb ischemia 06/25/2018  . Lymphangitis 06/25/2018  . Menorrhagia 06/25/2018  . Hx of BKA, left ([place]) 05/24/2018  . History  of left below knee amputation ([place]) 02/01/2018  . Gangrene of left foot ([place])   . Dehiscence of amputation stump ([place]) 01/26/2018  . Surgical wound, non healing 09/02/2017  . Bipolar 1 disorder  ([place]) 09/02/2017  . Nausea vomiting and diarrhea 08/11/2017  . Great toe pain, left 08/11/2017  . GERD (gastroesophageal reflux disease) 08/10/2017  . Anxiety 08/10/2017  . Edema   . Cellulitis   . AKI (acute kidney injury) ([place]) 02/04/2017  . Type II diabetes mellitus with renal manifestations, uncontrolled ([place]) 02/02/2017  . HLD (hyperlipidemia) 02/02/2017  . Tobacco abuse 02/02/2017  . Essential hypertension 02/02/2017  . Malnutrition of moderate degree 02/02/2017   PCP:  Patient, No Pcp Per Pharmacy:   [facility] [place], [address] [place] [address] Phone: [phone] Fax: [phone]     Social Determinants of Health (SDOH) Interventions    Readmission Risk Interventions No flowsheet data found.

## 2019-02-07 NOTE — Consult Note (Addendum)
Kathy Phelps   Reason for Phelps:  SI Referring Physician:  Dr. Cristal Ford Patient Identification: Kathy Phelps MRN:  [id number] Principal Diagnosis: Anxiety and depression Diagnosis:  Principal Problem:   Acute on chronic renal failure (HCC) Active Problems:   Type II diabetes mellitus with renal manifestations, uncontrolled (HCC)   HLD (hyperlipidemia)   Tobacco abuse   Essential hypertension   GERD (gastroesophageal reflux disease)   Bipolar 1 disorder (HCC)   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Kathy Phelps)   Total Time spent with patient: 1 hour  Subjective:   Kathy Phelps is a 38 y.o. female patient admitted with SI.  HPI:   Per chart review, patient was admitted with SI. She emptied a bottle of Gabapentin in her house and threatened to ingest the medication to kill herself. Her significant other called 911. She endorses AVH for a week. She reports intermittent CAH. She reports thoughts to overdose multiple times. She has not been taking her medications for her blood pressure or diabetes. She was admitted to the medical floor for management of acute on chronic renal failure. Home medications include Xanax 2 mg QID PRN, Cymbalta 90 mg daily, Gabapentin 800 mg 5 times daily, Latuda 120 mg qhs and Remeron 15 mg qhs. Gabapentin was reduced to 300 mg BID and Latuda was reduced to 80 mg qhs since hospitalization due to worsening renal function.  PMP indicates that she last filled a prescription for Xanax 2 mg tablets (#120) in 09/2018 which was prescribed by Dr. Bernita Raisin.   On interview, Ms. Chestang is significantly drowsy and has to be awakened several times. She initially reports overdosing on Gabapentin and then reports that she has had thoughts but not overdosed. She reports worsening mood due to stress. She has not been taking her medication since she does not have transportation to see her providers. She denies current SI, HI or AVH. She reports problems with falling  asleep and maintaining sleep. She reports poor appetite and denies recent changes in weight.   Past Psychiatric History: Schizophrenia, bipolar disorder, depression and anxiety.   Risk to Self:  Yes given recent SI with plan to overdose.  Risk to Others:  None. Denies HI.  Prior Inpatient Therapy:  She was last hospitalized over a year ago.  Prior Outpatient Therapy:  She is followed by Kathy Phelps (patient does not know her provider's last name or clinic name).   Past Medical History:  Past Medical History:  Diagnosis Date  . Anxiety   . Asthma   . Bipolar affective ([place])   . Complete miscarriage   . Constipation   . 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 Depression   . Diabetes mellitus    Type II  . Dyspnea     " only at night and I stop breathing in my sleep too for about the last three weeks"  . GERD (gastroesophageal reflux disease)   . Headache    "migraraines"  . Hyperlipidemia   . Hypertension   . Osteomyelitis ([place])    left foot  . Pregnancy complication    HELP Syndrome  . Renal disorder   . Schizophrenia ([place])   . Stroke [facility])    " mild stroke", memory loss- approx 2017    Past Surgical History:  Procedure Laterality Date  . ABDOMINAL AORTOGRAM N/A 09/07/2017   Procedure: ABDOMINAL AORTOGRAM;  Surgeon: Waynetta Sandy, MD;  Location: [place] [facility];  Service:  Cardiovascular;  Laterality: N/A;  . ABDOMINAL AORTOGRAM W/LOWER EXTREMITY N/A 02/03/2017   Procedure: Abdominal Aortogram w/Lower Extremity;  Surgeon: Waynetta Sandy, MD;  Location: [place] [facility];  Service: Cardiovascular;  Laterality: N/A;  . ABDOMINAL AORTOGRAM W/LOWER EXTREMITY N/A 06/27/2018   Procedure: ABDOMINAL AORTOGRAM W/LOWER EXTREMITY;  Surgeon: Serafina Mitchell, MD;  Location: [place] [facility];  Service: Cardiovascular;  Laterality: N/A;  . AMPUTATION Left 02/10/2017   Procedure: AMPUTATION TOES 3, 4 AND 5  LEFT FOOT;  Surgeon: Waynetta Sandy, MD;  Location: [place];  Service: Vascular;  Laterality: Left;  . AMPUTATION Left 12/21/2017   Procedure: LEFT FOOT 5TH RAY AMPUTATION;  Surgeon: Newt Minion, MD;  Location: [place];  Service: Orthopedics;  Laterality: Left;  . AMPUTATION Left 02/01/2018   Procedure: LEFT BELOW KNEE AMPUTATION;  Surgeon: Newt Minion, MD;  Location: [place];  Service: Orthopedics;  Laterality: Left;  . AMPUTATION Right 08/11/2018   Procedure: AMPUTATION BELOW KNEE;  Surgeon: Newt Minion, MD;  Location: [place];  Service: Orthopedics;  Laterality: Right;  . CESAREAN SECTION    . CHOLECYSTECTOMY    . LOWER EXTREMITY ANGIOGRAPHY Bilateral 09/07/2017   Procedure: Lower Extremity Angiography;  Surgeon: Waynetta Sandy, MD;  Location: [place] [facility];  Service: Cardiovascular;  Laterality: Bilateral;  . PERIPHERAL VASCULAR ATHERECTOMY  02/03/2017   Procedure: Peripheral Vascular Atherectomy;  Surgeon: Waynetta Sandy, MD;  Location: [place] [facility];  Service: Cardiovascular;;  . PERIPHERAL VASCULAR ATHERECTOMY Right 06/27/2018   Procedure: PERIPHERAL VASCULAR ATHERECTOMY;  Surgeon: Serafina Mitchell, MD;  Location: [place] [facility];  Service: Cardiovascular;  Laterality: Right;  superficial femoral  . PERIPHERAL VASCULAR BALLOON ANGIOPLASTY  02/03/2017   Procedure: Peripheral Vascular Balloon Angioplasty;  Surgeon: Waynetta Sandy, MD;  Location: [place] [facility];  Service: Cardiovascular;;  . PERIPHERAL VASCULAR INTERVENTION Left 09/07/2017   Procedure: PERIPHERAL VASCULAR INTERVENTION;  Surgeon: Waynetta Sandy, MD;  Location: [place] [facility];  Service: Cardiovascular;  Laterality: Left;  SFA/POPLITEAL  . STUMP REVISION Left 05/24/2018  . STUMP REVISION Left 05/24/2018   Procedure: REVISION LEFT BELOW KNEE AMPUTATION;  Surgeon: Newt Minion, MD;  Location: [place];  Service: Orthopedics;  Laterality: Left;  . TUBAL LIGATION     Family History:  Family  History  Problem Relation Age of Onset  . Diabetes Mellitus II Mother   . Heart disease Mother   . Heart disease Father    Family Psychiatric  History: Nephew-mental illness.  Social History:  Social History   Substance and Sexual Activity  Alcohol Use No     Social History   Substance and Sexual Activity  Drug Use No    Social History   Socioeconomic History  . Marital status: Single    Spouse name: Not on file  . Number of children: Not on file  . Years of education: Not on file  . Highest education level: Not on file  Occupational History  . Not on file  Social Needs  . Financial resource strain: Not on file  . Food insecurity:    Worry: Not on file    Inability: Not on file  . Transportation needs:    Medical: Not on file    Non-medical: Not on file  Tobacco Use  . Smoking status: Current Every Day Smoker    Packs/day: 1.00    Years: 28.00    Pack years: 28.00  Types: Cigarettes  . Smokeless tobacco: Never Used  Substance and Sexual Activity  . Alcohol use: No  . Drug use: No  . Sexual activity: Yes    Birth control/protection: None  Lifestyle  . Physical activity:    Days per week: Not on file    Minutes per session: Not on file  . Stress: Not on file  Relationships  . Social connections:    Talks on phone: Not on file    Gets together: Not on file    Attends religious service: Not on file    Active member of club or organization: Not on file    Attends meetings of clubs or organizations: Not on file    Relationship status: Not on file  Other Topics Concern  . Not on file  Social History Narrative  . Not on file   Additional Social History: She lives at home with her significant other and 3 young children. She is unemployed. She denies alcohol or illicit substance use.     Allergies:   Allergies  Allergen Reactions  . Hydrocodone-Acetaminophen Hives  . Tylenol [Acetaminophen] Itching and Swelling    Patient tolerated APAP 650 mg  (06/29/18) as well as oxycodone/APAP 5-325 mg during 06/25/2018 admission.    Labs:  Results for orders placed or performed during the hospital encounter of 02/07/19 (from the past 48 hour(s))  Comprehensive metabolic panel     Status: Abnormal   Collection Time: 02/07/19  1:07 AM  Result Value Ref Range   Sodium 137 135 - 145 mmol/L   Potassium 4.8 3.5 - 5.1 mmol/L   Chloride 115 (H) 98 - 111 mmol/L   CO2 15 (L) 22 - 32 mmol/L   Glucose, Bld 166 (H) 70 - 99 mg/dL   BUN 36 (H) 6 - 20 mg/dL   Creatinine, Ser 3.77 (H) 0.44 - 1.00 mg/dL   Calcium 8.2 (L) 8.9 - 10.3 mg/dL   Total Protein 6.4 (L) 6.5 - 8.1 g/dL   Albumin 3.2 (L) 3.5 - 5.0 g/dL   AST 12 (L) 15 - 41 U/L   ALT 6 0 - 44 U/L   Alkaline Phosphatase 59 38 - 126 U/L   Total Bilirubin 0.1 (L) 0.3 - 1.2 mg/dL   GFR calc non Af Amer 14 (L) >60 mL/min   GFR calc Af Amer 17 (L) >60 mL/min   Anion gap 7 5 - 15    Comment: Performed at [facility], [place] [address] [place], [place] [address]  Ethanol     Status: None   Collection Time: 02/07/19  1:07 AM  Result Value Ref Range   Alcohol, Ethyl (B) <10 <10 mg/dL    Comment: (NOTE) Lowest detectable limit for serum alcohol is 10 mg/dL. For medical purposes only. Performed at [facility], [place] [address] [place], [place] [address]   CBC with Diff     Status: Abnormal   Collection Time: 02/07/19  1:07 AM  Result Value Ref Range   WBC 10.4 4.0 - 10.5 K/uL   RBC 2.97 (L) 3.87 - 5.11 MIL/uL   Hemoglobin 9.1 (L) 12.0 - 15.0 g/dL   HCT 28.0 (L) 36.0 - 46.0 %   MCV 94.3 80.0 - 100.0 fL   MCH 30.6 26.0 - 34.0 pg   MCHC 32.5 30.0 - 36.0 g/dL   RDW 12.6 11.5 - 15.5 %   Platelets 228 150 - 400 K/uL   nRBC 0.0 0.0 - 0.2 %  Neutrophils Relative % 62 %   Neutro Abs 6.5 1.7 - 7.7 K/uL   Lymphocytes Relative 27 %   Lymphs Abs 2.8 0.7 - 4.0 K/uL   Monocytes Relative 5 %   Monocytes Absolute 0.6 0.1 - 1.0 K/uL   Eosinophils Relative 4 %    Eosinophils Absolute 0.4 0.0 - 0.5 K/uL   Basophils Relative 1 %   Basophils Absolute 0.1 0.0 - 0.1 K/uL   Immature Granulocytes 1 %   Abs Immature Granulocytes 0.05 0.00 - 0.07 K/uL    Comment: Performed at [facility], [place] [address] [place], [place] [address]  Salicylate level     Status: None   Collection Time: 02/07/19  1:07 AM  Result Value Ref Range   Salicylate Lvl <2.9 2.8 - 30.0 mg/dL    Comment: Performed at [facility], [place] [address], [place], [place] [address]  Acetaminophen level     Status: Abnormal   Collection Time: 02/07/19  1:07 AM  Result Value Ref Range   Acetaminophen (Tylenol), Serum <10 (L) 10 - 30 ug/mL    Comment: (NOTE) Therapeutic concentrations vary significantly. A range of 10-30 ug/mL  may be an effective concentration for many patients. However, some  are best treated at concentrations outside of this range. Acetaminophen concentrations >150 ug/mL at 4 hours after ingestion  and >50 ug/mL at 12 hours after ingestion are often associated with  toxic reactions. Performed at [facility], [place] [address], [place], [place] [address]   I-Stat beta hCG blood, ED     Status: None   Collection Time: 02/07/19  2:14 AM  Result Value Ref Range   I-stat hCG, quantitative <5.0 <5 mIU/mL   Comment 3            Comment:   GEST. AGE      CONC.  (mIU/mL)   <=1 WEEK        5 - 50     2 WEEKS       50 - 500     3 WEEKS       100 - 10,000     4 WEEKS     1,000 - 30,000        FEMALE AND NON-PREGNANT FEMALE:     LESS THAN 5 mIU/mL   CBG monitoring, ED     Status: Abnormal   Collection Time: 02/07/19  2:41 AM  Result Value Ref Range   Glucose-Capillary 135 (H) 70 - 99 mg/dL  CBC     Status: Abnormal   Collection Time: 02/07/19  5:22 AM  Result Value Ref Range   WBC 9.5 4.0 - 10.5 K/uL   RBC 2.58 (L) 3.87 - 5.11 MIL/uL   Hemoglobin 7.7 (L) 12.0 - 15.0 g/dL   HCT 24.7 (L) 36.0 - 46.0 %   MCV 95.7 80.0  - 100.0 fL   MCH 29.8 26.0 - 34.0 pg   MCHC 31.2 30.0 - 36.0 g/dL   RDW 12.4 11.5 - 15.5 %   Platelets 193 150 - 400 K/uL   nRBC 0.0 0.0 - 0.2 %    Comment: Performed at [facility], [place] [address], [place], [place] [address]  Comprehensive metabolic panel     Status: Abnormal   Collection Time: 02/07/19  5:22 AM  Result Value Ref Range   Sodium 138 135 - 145 mmol/L   Potassium 4.5 3.5 - 5.1 mmol/L   Chloride 120 (H) 98 -  111 mmol/L   CO2 15 (L) 22 - 32 mmol/L   Glucose, Bld 109 (H) 70 - 99 mg/dL   BUN 37 (H) 6 - 20 mg/dL   Creatinine, Ser 3.48 (H) 0.44 - 1.00 mg/dL   Calcium 7.9 (L) 8.9 - 10.3 mg/dL   Total Protein 5.8 (L) 6.5 - 8.1 g/dL   Albumin 2.9 (L) 3.5 - 5.0 g/dL   AST 9 (L) 15 - 41 U/L   ALT 6 0 - 44 U/L   Alkaline Phosphatase 52 38 - 126 U/L   Total Bilirubin 0.2 (L) 0.3 - 1.2 mg/dL   GFR calc non Af Amer 16 (L) >60 mL/min   GFR calc Af Amer 18 (L) >60 mL/min   Anion gap 3 (L) 5 - 15    Comment: Performed at [facility], [place] [address], [place], [place] [address]  Glucose, capillary     Status: None   Collection Time: 02/07/19  8:48 AM  Result Value Ref Range   Glucose-Capillary 72 70 - 99 mg/dL  Glucose, capillary     Status: None   Collection Time: 02/07/19 11:27 AM  Result Value Ref Range   Glucose-Capillary 82 70 - 99 mg/dL   Comment 1 Notify RN     Current Facility-Administered Medications  Medication Dose Route Frequency Provider Last Rate Last Dose  . 0.9 %  sodium chloride infusion   Intravenous Continuous Elwyn Reach, MD 125 mL/hr at 02/07/19 0339    . albuterol (PROVENTIL) (2.5 MG/3ML) 0.083% nebulizer solution 2.5 mg  2.5 mg Nebulization Q6H PRN Elwyn Reach, MD      . ALPRAZolam Duanne Moron) tablet 2 mg  2 mg Oral QID PRN Ward, Kristen N, DO   2 mg at 02/07/19 0229  . aspirin EC tablet 81 mg  81 mg Oral Daily Ward, Kristen N, DO   81 mg at 02/07/19 0900  . atorvastatin (LIPITOR) tablet 80 mg  80 mg Oral  q1800 Ward, Kristen N, DO      . cloNIDine (CATAPRES) tablet 0.1 mg  0.1 mg Oral BID Ward, Kristen N, DO   0.1 mg at 02/07/19 0900  . DULoxetine (CYMBALTA) DR capsule 90 mg  90 mg Oral BH-q7a Ward, Kristen N, DO   90 mg at 02/07/19 0900  . fluticasone furoate-vilanterol (BREO ELLIPTA) 100-25 MCG/INH 1 puff  1 puff Inhalation Daily Ward, Kristen N, DO      . gabapentin (NEURONTIN) capsule 300 mg  300 mg Oral BID Mikhail, Velta [place]ison, DO      . heparin injection 5,000 Units  5,000 Units Subcutaneous Q8H Elwyn Reach, MD   5,000 Units at 02/07/19 0501  . insulin aspart (novoLOG) injection 0-30 Units  0-30 Units Subcutaneous TID WC Ward, Kristen N, DO      . insulin aspart (novoLOG) injection 0-5 Units  0-5 Units Subcutaneous QHS Garba, Mohammad L, MD      . insulin aspart (novoLOG) injection 0-9 Units  0-9 Units Subcutaneous TID WC Garba, Mohammad L, MD      . insulin glargine (LANTUS) injection 20 Units  20 Units Subcutaneous Q breakfast Ward, Kristen N, DO   20 Units at 02/07/19 0900  . insulin glargine (LANTUS) injection 30 Units  30 Units Subcutaneous QHS Ward, Kristen N, DO   Stopped at 02/07/19 0248  . lurasidone (LATUDA) tablet 80 mg  80 mg Oral QHS Mikhail, Maryann, DO      . methocarbamol (ROBAXIN) tablet 500 mg  500 mg Oral Q6H PRN Ward, Kristen N, DO      . mirtazapine (REMERON) tablet 15 mg  15 mg Oral QHS Ward, Kristen N, DO   15 mg at 02/07/19 0221  . nicotine (NICODERM CQ - dosed in mg/24 hours) patch 21 mg  21 mg Transdermal Daily Ward, Kristen N, DO      . ondansetron (ZOFRAN) tablet 4 mg  4 mg Oral Q6H PRN Elwyn Reach, MD       Or  . ondansetron (ZOFRAN) injection 4 mg  4 mg Intravenous Q6H PRN Elwyn Reach, MD      . pantoprazole (PROTONIX) EC tablet 40 mg  40 mg Oral Daily PRN Ward, Kristen N, DO      . pentoxifylline (TRENTAL) CR tablet 400 mg  400 mg Oral TID WC Ward, Kristen N, DO   400 mg at 02/07/19 0900    Musculoskeletal: Strength & Muscle Tone: within  normal limits Gait & Station: UTA since patient is lying in bed. Patient leans: N/A  Psychiatric Specialty Exam: Physical Exam  Nursing note and vitals reviewed. Constitutional: She is oriented to person, place, and time. She appears well-developed and well-nourished.  HENT:  Head: Normocephalic and atraumatic.  Neck: Normal range of motion.  Respiratory: Effort normal.  Musculoskeletal: Normal range of motion.  Neurological: She is alert and oriented to person, place, and time.  Psychiatric: Her speech is normal and behavior is normal. Thought content normal. Her affect is blunt. She expresses impulsivity.    Review of Systems  Psychiatric/Behavioral: Positive for depression. Negative for hallucinations, substance abuse and suicidal ideas. The patient is nervous/anxious.   All other systems reviewed and are negative.   Blood pressure (!) 164/102, pulse 88, temperature (!) 97.3 F (36.3 C), temperature source Oral, resp. rate 12, height 5\' 9"  (1.753 m), weight 70.1 kg, SpO2 98 %.Body mass index is 22.82 kg/m.  General Appearance: Fairly Groomed, young, Caucasian female who appears older than her stated age who is lying in bed. NAD.   Eye Contact:  Good  Speech:  Clear and Coherent and Normal Rate  Volume:  Normal  Mood:  Depressed  Affect:  Constricted  Thought Process:  Goal Directed, Linear and Descriptions of Associations: Intact  Orientation:  Full (Time, Place, and Person)  Thought Content:  Logical  Suicidal Thoughts:  No  Homicidal Thoughts:  No  Memory:  Immediate;   Good Recent;   Good Remote;   Good  Judgement:  Fair  Insight:  Fair  Psychomotor Activity:  Normal  Concentration:  Concentration: Poor and Attention Span: Poor  Recall:  [place] of Knowledge:  Good  Language:  Good  Akathisia:  No  Handed:  Right  AIMS (if indicated):   N/A  Assets:  Housing Intimacy Resilience Social Support  ADL's:  Intact  Cognition:  WNL  Sleep:   Poor   Assessment:   EMIRA EUBANKS is a 38 y.o. female who presented with SI and was found to have acute on chronic renal failure so she was subsequently admitted to the medical floor. Patient denies current SI although she reports ongoing stressors and poor medication compliance. She is unable to safety plan. She warrants inpatient psychiatric hospitalization for stabilization and treatment at this time.   Treatment Plan Summary: -Patient warrants inpatient psychiatric hospitalization given high risk of harm to self. -Continue bedside sitter.  -Continue home medications for mood stabilization.  -Patient is significantly drowsy. Would recommend tapering  Xanax to TID dosing.  -Recommend updated EKG to monitor for QTc prolongation.  -Please pursue involuntary commitment if patient refuses voluntary psychiatric hospitalization or attempts to leave the hospital.  -Will sign off on patient at this time. Please Phelps psychiatry again as needed.    Disposition: Recommend psychiatric Inpatient admission when medically cleared.  Faythe Ding[place], DO 02/07/2019 11:42 AM

## 2019-02-08 ENCOUNTER — Encounter (HOSPITAL_COMMUNITY): Payer: Self-pay | Admitting: Radiology

## 2019-02-08 ENCOUNTER — Inpatient Hospital Stay (HOSPITAL_COMMUNITY): Payer: Medicaid Other

## 2019-02-08 DIAGNOSIS — F419 Anxiety disorder, unspecified: Secondary | ICD-10-CM

## 2019-02-08 DIAGNOSIS — Z89519 Acquired absence of unspecified leg below knee: Secondary | ICD-10-CM

## 2019-02-08 DIAGNOSIS — F329 Major depressive disorder, single episode, unspecified: Secondary | ICD-10-CM

## 2019-02-08 DIAGNOSIS — E1129 Type 2 diabetes mellitus with other diabetic kidney complication: Secondary | ICD-10-CM

## 2019-02-08 DIAGNOSIS — E785 Hyperlipidemia, unspecified: Secondary | ICD-10-CM

## 2019-02-08 DIAGNOSIS — E1165 Type 2 diabetes mellitus with hyperglycemia: Secondary | ICD-10-CM

## 2019-02-08 LAB — HEMOGLOBIN AND HEMATOCRIT, BLOOD
HCT: 24.5 % — ABNORMAL LOW (ref 36.0–46.0)
Hemoglobin: 7.8 g/dL — ABNORMAL LOW (ref 12.0–15.0)

## 2019-02-08 LAB — CBC
HCT: 26.7 % — ABNORMAL LOW (ref 36.0–46.0)
Hemoglobin: 8.2 g/dL — ABNORMAL LOW (ref 12.0–15.0)
MCH: 30.6 pg (ref 26.0–34.0)
MCHC: 30.7 g/dL (ref 30.0–36.0)
MCV: 99.6 fL (ref 80.0–100.0)
Platelets: 202 10*3/uL (ref 150–400)
RBC: 2.68 MIL/uL — ABNORMAL LOW (ref 3.87–5.11)
RDW: 12.6 % (ref 11.5–15.5)
WBC: 10.3 10*3/uL (ref 4.0–10.5)
nRBC: 0 % (ref 0.0–0.2)

## 2019-02-08 LAB — GLUCOSE, CAPILLARY
Glucose-Capillary: 43 mg/dL — CL (ref 70–99)
Glucose-Capillary: 50 mg/dL — ABNORMAL LOW (ref 70–99)
Glucose-Capillary: 142 mg/dL — ABNORMAL HIGH (ref 70–99)
Glucose-Capillary: 118 mg/dL — ABNORMAL HIGH (ref 70–99)
Glucose-Capillary: 76 mg/dL (ref 70–99)

## 2019-02-08 LAB — BASIC METABOLIC PANEL
Anion gap: 4 — ABNORMAL LOW (ref 5–15)
BUN: 31 mg/dL — ABNORMAL HIGH (ref 6–20)
CO2: 12 mmol/L — ABNORMAL LOW (ref 22–32)
Calcium: 7.9 mg/dL — ABNORMAL LOW (ref 8.9–10.3)
Chloride: 125 mmol/L — ABNORMAL HIGH (ref 98–111)
Creatinine, Ser: 3.29 mg/dL — ABNORMAL HIGH (ref 0.44–1.00)
GFR calc Af Amer: 19 mL/min — ABNORMAL LOW (ref >60)
GFR calc non Af Amer: 17 mL/min — ABNORMAL LOW (ref >60)
Glucose, Bld: 60 mg/dL — ABNORMAL LOW (ref 70–99)
Potassium: 5.3 mmol/L — ABNORMAL HIGH (ref 3.5–5.1)
Sodium: 141 mmol/L (ref 135–145)

## 2019-02-08 LAB — HEMOGLOBIN A1C
Hgb A1c MFr Bld: 6.8 % — ABNORMAL HIGH (ref 4.8–5.6)
Mean Plasma Glucose: 148.46 mg/dL

## 2019-02-08 MED ORDER — DEXTROSE 50 % IV SOLN
INTRAVENOUS | Status: AC
  Administered 2019-02-08: 09:00:00 50 mL
  Filled 2019-02-08: qty 50

## 2019-02-08 MED ORDER — INSULIN GLARGINE 100 UNIT/ML ~~LOC~~ SOLN
15.0000 [IU] | Freq: Every day | SUBCUTANEOUS | Status: DC
  Filled 2019-02-08: qty 0.15

## 2019-02-08 MED ORDER — SODIUM ZIRCONIUM CYCLOSILICATE 10 G PO PACK
10.0000 g | PACK | Freq: Once | ORAL | Status: DC
  Filled 2019-02-08: qty 1

## 2019-02-08 NOTE — Progress Notes (Signed)
Hypoglycemic Event  CBG: 43  Treatment: D50 25 mL (12.5 gm)  Symptoms: None  Follow-up CBG: Time:0900 CBG Result:142  Possible Reasons for Event: Inadequate meal intake  Comments/MD notified: Dr. Ree Kida notified    Maryjo Rochester, Laurel Dimmer

## 2019-02-08 NOTE — Progress Notes (Signed)
Hypoglycemic Event  CBG: 50  Treatment: 8 oz juice/soda  Symptoms: None  Follow-up CBG: Time:0823 CBG Result:43  Possible Reasons for Event: Inadequate meal intake  Comments/MD notified: Dr. Ree Kida notified, will hold lantus this am.     Stacey Drain

## 2019-02-08 NOTE — Consult Note (Addendum)
[facility] Psych Consult Progress Note  02/08/2019 1:38 PM BRIANI MAUL  MRN:  [id number] Subjective:   Ms. Slevin was seen yesterday for SI. She reportedly emptied a bottle of Gabapentin in her house and threatened to ingest the medication to kill herself. He significant other called 911. She was inconsistent with reporting information yesterday. She initially endorsed overdosing then denied a suicide attempt. She was also significantly drowsy. Latuda and Gabapentin were reduced on admission due to worsening renal function. She was unable to develop a safety plan and was recommended for inpatient psychiatric hospitalization. Psychiatry is consulted for reevaluation today.   On interview, Ms. Kallstrom reports that she was off her medications for 4 months because she was unable to fill them. She restarted them at their full doses just prior to hospitalization and it made her significantly drowsy. She reports that she did not try to harm herself. She reports that her 40 y/o and 17 y/o daughters were arguing and she grabbed her bottle of Gabapentin. She shook it and stated, "This is going to stop." She reports that her youngest daughter took the medication from her and informed her significant other who told her daughter to call 911. She reports that they interpreted her behavior to be suicidal but she reports that she only meant that her children needed to stop arguing with one another. She denies current SI, HI or AVH. She denies problems with sleep or appetite. She requests resources for a new psychiatrist since her former psychiatrist, Dr. Rosine Door lost his medical license.   Principal Problem: Anxiety and depression Diagnosis:  Principal Problem:   Anxiety and depression Active Problems:   Type II diabetes mellitus with renal manifestations, uncontrolled (HCC)   HLD (hyperlipidemia)   Tobacco abuse   Essential hypertension   GERD (gastroesophageal reflux disease)   Bipolar 1 disorder (HCC)   Hx of  bilateral BKA (HCC)   Acute on chronic renal failure (HCC)  Total Time spent with patient: 15 minutes  Past Psychiatric History: Schizophrenia, bipolar disorder, depression and anxiety.   Past Medical History:  Past Medical History:  Diagnosis Date  . Anxiety   . Asthma   . Bipolar affective ([place])   . Complete miscarriage   . Constipation   . 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 Depression   . Diabetes mellitus    Type II  . Dyspnea     " only at night and I stop breathing in my sleep too for about the last three weeks"  . GERD (gastroesophageal reflux disease)   . Headache    "migraraines"  . Hyperlipidemia   . Hypertension   . Osteomyelitis ([place])    left foot  . Pregnancy complication    HELP Syndrome  . Renal disorder   . Schizophrenia ([place])   . Stroke [facility])    " mild stroke", memory loss- approx 2017    Past Surgical History:  Procedure Laterality Date  . ABDOMINAL AORTOGRAM N/A 09/07/2017   Procedure: ABDOMINAL AORTOGRAM;  Surgeon: Waynetta Sandy, MD;  Location: [place] [facility];  Service: Cardiovascular;  Laterality: N/A;  . ABDOMINAL AORTOGRAM W/LOWER EXTREMITY N/A 02/03/2017   Procedure: Abdominal Aortogram w/Lower Extremity;  Surgeon: Waynetta Sandy, MD;  Location: [place] [facility];  Service: Cardiovascular;  Laterality: N/A;  . ABDOMINAL AORTOGRAM W/LOWER EXTREMITY N/A 06/27/2018   Procedure: ABDOMINAL AORTOGRAM W/LOWER EXTREMITY;  Surgeon: Serafina Mitchell, MD;  Location: [place] [facility];  Service: Cardiovascular;  Laterality: N/A;  . AMPUTATION Left 02/10/2017   Procedure: AMPUTATION TOES 3, 4 AND 5  LEFT FOOT;  Surgeon: Waynetta Sandy, MD;  Location: [place];  Service: Vascular;  Laterality: Left;  . AMPUTATION Left 12/21/2017   Procedure: LEFT FOOT 5TH RAY AMPUTATION;  Surgeon: Newt Minion, MD;  Location: [place];  Service: Orthopedics;  Laterality: Left;  . AMPUTATION Left 02/01/2018   Procedure: LEFT  BELOW KNEE AMPUTATION;  Surgeon: Newt Minion, MD;  Location: [place];  Service: Orthopedics;  Laterality: Left;  . AMPUTATION Right 08/11/2018   Procedure: AMPUTATION BELOW KNEE;  Surgeon: Newt Minion, MD;  Location: [place];  Service: Orthopedics;  Laterality: Right;  . CESAREAN SECTION    . CHOLECYSTECTOMY    . LOWER EXTREMITY ANGIOGRAPHY Bilateral 09/07/2017   Procedure: Lower Extremity Angiography;  Surgeon: Waynetta Sandy, MD;  Location: [place] [facility];  Service: Cardiovascular;  Laterality: Bilateral;  . PERIPHERAL VASCULAR ATHERECTOMY  02/03/2017   Procedure: Peripheral Vascular Atherectomy;  Surgeon: Waynetta Sandy, MD;  Location: [place] [facility];  Service: Cardiovascular;;  . PERIPHERAL VASCULAR ATHERECTOMY Right 06/27/2018   Procedure: PERIPHERAL VASCULAR ATHERECTOMY;  Surgeon: Serafina Mitchell, MD;  Location: [place] [facility];  Service: Cardiovascular;  Laterality: Right;  superficial femoral  . PERIPHERAL VASCULAR BALLOON ANGIOPLASTY  02/03/2017   Procedure: Peripheral Vascular Balloon Angioplasty;  Surgeon: Waynetta Sandy, MD;  Location: [place] [facility];  Service: Cardiovascular;;  . PERIPHERAL VASCULAR INTERVENTION Left 09/07/2017   Procedure: PERIPHERAL VASCULAR INTERVENTION;  Surgeon: Waynetta Sandy, MD;  Location: [place] [facility];  Service: Cardiovascular;  Laterality: Left;  SFA/POPLITEAL  . STUMP REVISION Left 05/24/2018  . STUMP REVISION Left 05/24/2018   Procedure: REVISION LEFT BELOW KNEE AMPUTATION;  Surgeon: Newt Minion, MD;  Location: [place];  Service: Orthopedics;  Laterality: Left;  . TUBAL LIGATION     Family History:  Family History  Problem Relation Age of Onset  . Diabetes Mellitus II Mother   . Heart disease Mother   . Heart disease Father    Family Psychiatric  History: Nephew-mental illness.  Social History:  Social History   Substance and Sexual Activity  Alcohol Use No     Social History    Substance and Sexual Activity  Drug Use No    Social History   Socioeconomic History  . Marital status: Single    Spouse name: Not on file  . Number of children: Not on file  . Years of education: Not on file  . Highest education level: Not on file  Occupational History  . Not on file  Social Needs  . Financial resource strain: Not on file  . Food insecurity:    Worry: Not on file    Inability: Not on file  . Transportation needs:    Medical: Not on file    Non-medical: Not on file  Tobacco Use  . Smoking status: Current Every Day Smoker    Packs/day: 1.00    Years: 28.00    Pack years: 28.00    Types: Cigarettes  . Smokeless tobacco: Never Used  Substance and Sexual Activity  . Alcohol use: No  . Drug use: No  . Sexual activity: Yes    Birth control/protection: None  Lifestyle  . Physical activity:    Days per week: Not on file    Minutes per session: Not on file  . Stress: Not on file  Relationships  . Social connections:    Talks on phone: Not on file    Gets together: Not on file    Attends religious service: Not on file    Active member of club or organization: Not on file    Attends meetings of clubs or organizations: Not on file    Relationship status: Not on file  Other Topics Concern  . Not on file  Social History Narrative  . Not on file    Sleep: Good  Appetite:  Good  Current Medications: Current Facility-Administered Medications  Medication Dose Route Frequency Provider Last Rate Last Dose  . 0.9 %  sodium chloride infusion   Intravenous Continuous Elwyn Reach, MD 125 mL/hr at 02/08/19 1328    . albuterol (PROVENTIL) (2.5 MG/3ML) 0.083% nebulizer solution 2.5 mg  2.5 mg Nebulization Q6H PRN Elwyn Reach, MD      . ALPRAZolam Duanne Moron) tablet 2 mg  2 mg Oral TID PRN Cristal Ford, DO      . aspirin EC tablet 81 mg  81 mg Oral Daily Ward, Kristen N, DO   81 mg at 02/08/19 1156  . atorvastatin (LIPITOR) tablet 80 mg  80 mg Oral  q1800 Ward, Kristen N, DO      . cloNIDine (CATAPRES) tablet 0.1 mg  0.1 mg Oral BID Ward, Kristen N, DO   0.1 mg at 02/08/19 1157  . DULoxetine (CYMBALTA) DR capsule 90 mg  90 mg Oral BH-q7a Ward, Kristen N, DO   90 mg at 02/07/19 0900  . fluticasone furoate-vilanterol (BREO ELLIPTA) 100-25 MCG/INH 1 puff  1 puff Inhalation Daily Ward, Kristen N, DO      . gabapentin (NEURONTIN) capsule 300 mg  300 mg Oral BID Cristal Ford, DO   300 mg at 02/08/19 1156  . heparin injection 5,000 Units  5,000 Units Subcutaneous Q8H Elwyn Reach, MD   5,000 Units at 02/07/19 1424  . insulin aspart (novoLOG) injection 0-5 Units  0-5 Units Subcutaneous QHS Garba, Mohammad L, MD      . insulin aspart (novoLOG) injection 0-9 Units  0-9 Units Subcutaneous TID WC Garba, Mohammad L, MD      . insulin glargine (LANTUS) injection 15 Units  15 Units Subcutaneous QHS Mikhail, Maryann, DO      . lurasidone (LATUDA) tablet 80 mg  80 mg Oral QHS Mikhail, Maryann, DO      . methocarbamol (ROBAXIN) tablet 500 mg  500 mg Oral Q6H PRN Ward, Kristen N, DO      . mirtazapine (REMERON) tablet 15 mg  15 mg Oral QHS Ward, Kristen N, DO   15 mg at 02/07/19 0221  . nicotine (NICODERM CQ - dosed in mg/24 hours) patch 21 mg  21 mg Transdermal Daily Ward, Kristen N, DO      . ondansetron (ZOFRAN) tablet 4 mg  4 mg Oral Q6H PRN Elwyn Reach, MD       Or  . ondansetron (ZOFRAN) injection 4 mg  4 mg Intravenous Q6H PRN Elwyn Reach, MD      . pantoprazole (PROTONIX) EC tablet 40 mg  40 mg Oral Daily PRN Ward, Kristen N, DO      . pentoxifylline (TRENTAL) CR tablet 400 mg  400 mg Oral TID WC Ward, Kristen N, DO   400 mg at 02/08/19 0800  . sodium zirconium cyclosilicate (LOKELMA) packet 10 g  10 g Oral Once Cristal Ford, DO      . tamsulosin (  FLOMAX) capsule 0.4 mg  0.4 mg Oral Daily Cristal Ford, DO   0.4 mg at 02/08/19 1157    Lab Results:  Results for orders placed or performed during the hospital encounter of  02/07/19 (from the past 48 hour(s))  Comprehensive metabolic panel     Status: Abnormal   Collection Time: 02/07/19  1:07 AM  Result Value Ref Range   Sodium 137 135 - 145 mmol/L   Potassium 4.8 3.5 - 5.1 mmol/L   Chloride 115 (H) 98 - 111 mmol/L   CO2 15 (L) 22 - 32 mmol/L   Glucose, Bld 166 (H) 70 - 99 mg/dL   BUN 36 (H) 6 - 20 mg/dL   Creatinine, Ser 3.77 (H) 0.44 - 1.00 mg/dL   Calcium 8.2 (L) 8.9 - 10.3 mg/dL   Total Protein 6.4 (L) 6.5 - 8.1 g/dL   Albumin 3.2 (L) 3.5 - 5.0 g/dL   AST 12 (L) 15 - 41 U/L   ALT 6 0 - 44 U/L   Alkaline Phosphatase 59 38 - 126 U/L   Total Bilirubin 0.1 (L) 0.3 - 1.2 mg/dL   GFR calc non Af Amer 14 (L) >60 mL/min   GFR calc Af Amer 17 (L) >60 mL/min   Anion gap 7 5 - 15    Comment: Performed at [facility], [place] [address] [place], [place] [address]  Ethanol     Status: None   Collection Time: 02/07/19  1:07 AM  Result Value Ref Range   Alcohol, Ethyl (B) <10 <10 mg/dL    Comment: (NOTE) Lowest detectable limit for serum alcohol is 10 mg/dL. For medical purposes only. Performed at [facility], [place] [address], [place], [place] [address]   CBC with Diff     Status: Abnormal   Collection Time: 02/07/19  1:07 AM  Result Value Ref Range   WBC 10.4 4.0 - 10.5 K/uL   RBC 2.97 (L) 3.87 - 5.11 MIL/uL   Hemoglobin 9.1 (L) 12.0 - 15.0 g/dL   HCT 28.0 (L) 36.0 - 46.0 %   MCV 94.3 80.0 - 100.0 fL   MCH 30.6 26.0 - 34.0 pg   MCHC 32.5 30.0 - 36.0 g/dL   RDW 12.6 11.5 - 15.5 %   Platelets 228 150 - 400 K/uL   nRBC 0.0 0.0 - 0.2 %   Neutrophils Relative % 62 %   Neutro Abs 6.5 1.7 - 7.7 K/uL   Lymphocytes Relative 27 %   Lymphs Abs 2.8 0.7 - 4.0 K/uL   Monocytes Relative 5 %   Monocytes Absolute 0.6 0.1 - 1.0 K/uL   Eosinophils Relative 4 %   Eosinophils Absolute 0.4 0.0 - 0.5 K/uL   Basophils Relative 1 %   Basophils Absolute 0.1 0.0 - 0.1 K/uL   Immature Granulocytes 1 %   Abs Immature Granulocytes  0.05 0.00 - 0.07 K/uL    Comment: Performed at [facility], [place] [address] [place], [place] [address]  Salicylate level     Status: None   Collection Time: 02/07/19  1:07 AM  Result Value Ref Range   Salicylate Lvl <1.7 2.8 - 30.0 mg/dL    Comment: Performed at [facility], [place] [address], [place], [place] [address]  Acetaminophen level     Status: Abnormal   Collection Time: 02/07/19  1:07 AM  Result Value Ref Range   Acetaminophen (Tylenol), Serum <10 (L) 10 - 30 ug/mL  Comment: (NOTE) Therapeutic concentrations vary significantly. A range of 10-30 ug/mL  may be an effective concentration for many patients. However, some  are best treated at concentrations outside of this range. Acetaminophen concentrations >150 ug/mL at 4 hours after ingestion  and >50 ug/mL at 12 hours after ingestion are often associated with  toxic reactions. Performed at [facility], [place] [address], [place], [place] [address]   I-Stat beta hCG blood, ED     Status: None   Collection Time: 02/07/19  2:14 AM  Result Value Ref Range   I-stat hCG, quantitative <5.0 <5 mIU/mL   Comment 3            Comment:   GEST. AGE      CONC.  (mIU/mL)   <=1 WEEK        5 - 50     2 WEEKS       50 - 500     3 WEEKS       100 - 10,000     4 WEEKS     1,000 - 30,000        FEMALE AND NON-PREGNANT FEMALE:     LESS THAN 5 mIU/mL   CBG monitoring, ED     Status: Abnormal   Collection Time: 02/07/19  2:41 AM  Result Value Ref Range   Glucose-Capillary 135 (H) 70 - 99 mg/dL  CBC     Status: Abnormal   Collection Time: 02/07/19  5:22 AM  Result Value Ref Range   WBC 9.5 4.0 - 10.5 K/uL   RBC 2.58 (L) 3.87 - 5.11 MIL/uL   Hemoglobin 7.7 (L) 12.0 - 15.0 g/dL   HCT 24.7 (L) 36.0 - 46.0 %   MCV 95.7 80.0 - 100.0 fL   MCH 29.8 26.0 - 34.0 pg   MCHC 31.2 30.0 - 36.0 g/dL   RDW 12.4 11.5 - 15.5 %   Platelets 193 150 - 400 K/uL   nRBC 0.0 0.0 - 0.2 %    Comment:  Performed at [facility], [place] [address], [place], [place] [address]  Comprehensive metabolic panel     Status: Abnormal   Collection Time: 02/07/19  5:22 AM  Result Value Ref Range   Sodium 138 135 - 145 mmol/L   Potassium 4.5 3.5 - 5.1 mmol/L   Chloride 120 (H) 98 - 111 mmol/L   CO2 15 (L) 22 - 32 mmol/L   Glucose, Bld 109 (H) 70 - 99 mg/dL   BUN 37 (H) 6 - 20 mg/dL   Creatinine, Ser 3.48 (H) 0.44 - 1.00 mg/dL   Calcium 7.9 (L) 8.9 - 10.3 mg/dL   Total Protein 5.8 (L) 6.5 - 8.1 g/dL   Albumin 2.9 (L) 3.5 - 5.0 g/dL   AST 9 (L) 15 - 41 U/L   ALT 6 0 - 44 U/L   Alkaline Phosphatase 52 38 - 126 U/L   Total Bilirubin 0.2 (L) 0.3 - 1.2 mg/dL   GFR calc non Af Amer 16 (L) >60 mL/min   GFR calc Af Amer 18 (L) >60 mL/min   Anion gap 3 (L) 5 - 15    Comment: Performed at [facility], [place] [address] [place], [place] [address]  Glucose, capillary     Status: None   Collection Time: 02/07/19  8:48 AM  Result Value Ref Range   Glucose-Capillary 72 70 - 99 mg/dL  Glucose, capillary     Status: None  Collection Time: 02/07/19 11:27 AM  Result Value Ref Range   Glucose-Capillary 82 70 - 99 mg/dL   Comment 1 Notify RN   Glucose, capillary     Status: Abnormal   Collection Time: 02/07/19  4:19 PM  Result Value Ref Range   Glucose-Capillary 64 (L) 70 - 99 mg/dL   Comment 1 Notify RN   Glucose, capillary     Status: Abnormal   Collection Time: 02/07/19  4:45 PM  Result Value Ref Range   Glucose-Capillary 59 (L) 70 - 99 mg/dL   Comment 1 Notify RN   Glucose, capillary     Status: None   Collection Time: 02/07/19  5:11 PM  Result Value Ref Range   Glucose-Capillary 81 70 - 99 mg/dL   Comment 1 Notify RN   Glucose, capillary     Status: None   Collection Time: 02/07/19  8:52 PM  Result Value Ref Range   Glucose-Capillary 90 70 - 99 mg/dL   Comment 1 Notify RN    Comment 2 Document in Chart   CBC     Status: Abnormal   Collection Time: 02/08/19   5:00 AM  Result Value Ref Range   WBC 10.3 4.0 - 10.5 K/uL   RBC 2.68 (L) 3.87 - 5.11 MIL/uL   Hemoglobin 8.2 (L) 12.0 - 15.0 g/dL   HCT 26.7 (L) 36.0 - 46.0 %   MCV 99.6 80.0 - 100.0 fL   MCH 30.6 26.0 - 34.0 pg   MCHC 30.7 30.0 - 36.0 g/dL   RDW 12.6 11.5 - 15.5 %   Platelets 202 150 - 400 K/uL   nRBC 0.0 0.0 - 0.2 %    Comment: Performed at [facility], [place] [address] [place], [place] [address]  Hemoglobin A1c     Status: Abnormal   Collection Time: 02/08/19  5:00 AM  Result Value Ref Range   Hgb A1c MFr Bld 6.8 (H) 4.8 - 5.6 %    Comment: (NOTE) Pre diabetes:          5.7%-6.4% Diabetes:              >6.4% Glycemic control for   <7.0% adults with diabetes    Mean Plasma Glucose 148.46 mg/dL    Comment: Performed at [place] [address], [place], [place] [address]  Basic metabolic panel     Status: Abnormal   Collection Time: 02/08/19  5:00 AM  Result Value Ref Range   Sodium 141 135 - 145 mmol/L   Potassium 5.3 (H) 3.5 - 5.1 mmol/L   Chloride 125 (H) 98 - 111 mmol/L   CO2 12 (L) 22 - 32 mmol/L   Glucose, Bld 60 (L) 70 - 99 mg/dL   BUN 31 (H) 6 - 20 mg/dL   Creatinine, Ser 3.29 (H) 0.44 - 1.00 mg/dL   Calcium 7.9 (L) 8.9 - 10.3 mg/dL   GFR calc non Af Amer 17 (L) >60 mL/min   GFR calc Af Amer 19 (L) >60 mL/min   Anion gap 4 (L) 5 - 15    Comment: Performed at [facility], [place] [address], [place], [place] [address]  Glucose, capillary     Status: Abnormal   Collection Time: 02/08/19  7:55 AM  Result Value Ref Range   Glucose-Capillary 50 (L) 70 - 99 mg/dL  Glucose, capillary     Status: Abnormal   Collection Time: 02/08/19  8:23 AM  Result Value Ref  Range   Glucose-Capillary 43 (LL) 70 - 99 mg/dL   Comment 1 Notify RN    Comment 2 Document in Chart   Glucose, capillary     Status: Abnormal   Collection Time: 02/08/19  9:07 AM  Result Value Ref Range   Glucose-Capillary 142 (H) 70 - 99 mg/dL   Comment 1  Notify RN    Comment 2 Document in Chart   Hemoglobin and hematocrit, blood     Status: Abnormal   Collection Time: 02/08/19 10:58 AM  Result Value Ref Range   Hemoglobin 7.8 (L) 12.0 - 15.0 g/dL   HCT 24.5 (L) 36.0 - 46.0 %    Comment: Performed at [facility], [place] [address], [place], [place] [address]  Glucose, capillary     Status: Abnormal   Collection Time: 02/08/19 11:47 AM  Result Value Ref Range   Glucose-Capillary 118 (H) 70 - 99 mg/dL   Comment 1 Notify RN    Comment 2 Document in Chart     Blood Alcohol level:  Lab Results  Component Value Date   [facility] <10 02/07/2019   ETH <11 07/16/2014    Musculoskeletal: Strength & Muscle Tone: within normal limits Gait & Station: unable to stand.History of right AKA and left BKA.  Patient leans: N/A  Psychiatric Specialty Exam: Physical Exam  Nursing note and vitals reviewed. Constitutional: She is oriented to person, place, and time. She appears well-developed and well-nourished.  HENT:  Head: Normocephalic and atraumatic.  Neck: Normal range of motion.  Respiratory: Effort normal.  Musculoskeletal: Normal range of motion.  Neurological: She is alert and oriented to person, place, and time.  Psychiatric: She has a normal mood and affect. Her speech is normal and behavior is normal. Judgment and thought content normal. Cognition and memory are normal.    Review of Systems  Gastrointestinal: Positive for diarrhea. Negative for constipation, nausea and vomiting.  Psychiatric/Behavioral: Negative for depression, hallucinations and suicidal ideas.  All other systems reviewed and are negative.   Blood pressure (!) 153/91, pulse 82, temperature 98 F (36.7 C), temperature source Oral, resp. rate 11, height 5\' 9"  (1.753 m), weight 70.1 kg, last menstrual period 02/08/2019, SpO2 100 %.Body mass index is 22.82 kg/m.  General Appearance: Fairly Groomed, young, Caucasian female, wearing a hospital gown who is  lying in bed. NAD.   Eye Contact:  Good  Speech:  Clear and Coherent and Normal Rate  Volume:  Normal  Mood:  Euthymic  Affect:  Appropriate and Congruent  Thought Process:  Goal Directed, Linear and Descriptions of Associations: Intact  Orientation:  Full (Time, Place, and Person)  Thought Content:  Logical  Suicidal Thoughts:  No  Homicidal Thoughts:  No  Memory:  Immediate;   Good Recent;   Good Remote;   Good  Judgement:  Fair  Insight:  Fair  Psychomotor Activity:  Normal  Concentration:  Concentration: Good and Attention Span: Good  Recall:  Good  Fund of Knowledge:  Good  Language:  Good  Akathisia:  No  Handed:  Right  AIMS (if indicated):   N/A  Assets:  Communication Skills Desire for Improvement Housing Intimacy Resilience Social Support  ADL's:  Intact  Cognition:  WNL  Sleep:   Okay   Assessment:  Kathy Phelps is a 38 y.o. female who was admitted with SI and was found to have acute on chronic renal failure so she was subsequently admitted to the medical floor. She adamantly denies  SI and mood symptoms. She denies HI or AVH. She reports restarting her medications at her home dose after stopping them for 4 months prior to hospitalization which lead to AMS and sedation. She is alert today and appropriate in behavior. She does not appear to warrant inpatient psychiatric hospitalization at this time. Her significant other was contacted for collateral without success but will need to speak to him regarding safety concerns prior to discharge.   Treatment Plan Summary: -Continue Cymbalta 90 mg daily for depression. -Continue Gabapentin 300 mg BID for anxiety/mood stabilization. -Continue Latuda 80 mg qhs for mood stabilization.  -Continue Remeron 15 mg qhs for mood/anxiety.  -Discontinue Xanax since patient has not had this medication for months and it is not indicated for long term management of anxiety and can cause rebound anxiety.  -Disposition is pending until  able to reach significant other, Louis.    Faythe Dingwall, DO 02/08/2019, 1:38 PM   Addendum: Spoke to patient's significant other, Louis. He denies concerns for her safety and reports that she never endorsed SI and there was a misunderstanding among the family. She is psychiatrically cleared. SW contacted to provide patient with outpatient resources for medication management.   Buford Dresser, DO 02/08/19 3:46 PM

## 2019-02-08 NOTE — Progress Notes (Addendum)
PROGRESS NOTE    Kathy Phelps  NID:[id number] DOB: 30-Apr-1981 DOA: 02/07/2019 PCP: Patient, No Pcp Per   Brief Narrative:  HPI on 02/07/2019 by Dr. Gala Romney Kathy Phelps is a 38 y.o. female with medical history significant of diabetes with noncompliance, schizophrenia, hypertension, hyperlipidemia, bilateral BKA who was brought to the emergency room with suicidal ideation.  Patient has not been taking her medications for her blood pressure and diabetes.  She apparently was upset tonight and took gabapentin and emptied the bottle into her house threatening to take them to kill herself but never did.  Significant other called 911 and she was brought to the emergency room.  Patient voiced suicidal ideation in the emergency room.  Also mention visual and auditory hallucinations consistent with her schizophrenia which has been going on for about a week.  Some of the voices have been telling her to kill herself.  She has thought about overdosing several times but did not move forward.  No homicidal ideation.  Patient has had a history of poor compliance with treatment.  High evaluation in the ER showed acute on chronic renal failure with baseline creatinine is around 2 but currently she is more than 3.  Psychiatric consultation was sought for however patient needs medical clearance with her worsening renal function.  She is therefore being admitted to the medical service.  She denied any shortness of breath, no cough no nausea vomiting or diarrhea.  Interim history Admitted with acute kidney injury and suicidal ideations.  Creatinine is improving slowly.  Pending further recommendations from psychiatry. 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  This may indicate distal utero obstruction, correlation with CT may be indicated.  Unremarkable left kidney with no hydronephrosis. -Will obtain CT abdomen pelvis, pending those results may consult urology -Continue to monitor BMP  Urinary retention -Patient states she only urinates once daily -Started Flomax  Suicidal ideation -Psychiatry consulted and appreciated, and recommended inpatient psychiatric hospitalization -History of schizophrenia and bipolar disorder -Continue Latuda, gabapentin (have renally adjusted doses), Remeron, Cymbalta -Pending urine drug screen -Had discussion with patient this morning, she states that she listened to her daughters arguing prior to admission and at that point was tired of it.  She grabbed a bottle of medications which she has not taken any in 4 months, for them into her hand instead I would like to end this in front of her daughters.  She stated that her daughter stopped her.  Currently she denies any suicidal thoughts or ideations or intentions.  Chronic normocytic anemia/anemia of chronic disease -Hemoglobin 9.1 on admission, as above is dropped to 7.7 -Suspect drop in hemoglobin due to dilutional component -Hemoglobin currently 8.2 -Continue to monitor CBC, transfuse if hemoglobin drops below 7  Diabetes mellitus, type II complicated by episodes of hypoglycemia -Last hemoglobin A1c 8 on 08/11/2018 -Continue insulin sliding scale with CBG monitoring -Hemoglobin A1c 6.8 -Patient has not been eating much, will discontinue Lantus as her blood sugars have been dropping  Essential hypertension -Continue clonidine  Hyperlipidemia -Continue statin  Tobacco abuse -Continue nicotine patch  GERD -Continue PPI  Hyperkalemia -Potassium 5.3, will give dose of the Lokelma continue to monitor BMP  DVT Prophylaxis  Heparin  Code Status: Full   Family Communication: None at bedside.  Disposition Plan: Admitted.  Disposition pending.  Will continue to monitor renal function.  Per psychiatry, patient needing inpatient psychiatric admission.  Consultants Psychiatry  Procedures  Renal US  Antibiotics   Anti-infectives (From admission, onward)   None      Subjective:   Kathy Phelps seen and examined today.  She with no complaints this morning.  She denies any current chest pain, shortness breath, abdominal pain, nausea or vomiting, diarrhea constipation, dizziness or headache.  States that she is not suicidal has no thoughts of harming herself or anyone else at this time.  Objective:   Vitals:   02/07/19 0831 02/07/19 1300 02/07/19 2043 02/08/19 0554  BP:  122/85 (!) 154/92 (!) 172/100  Pulse:  78 77 87  Resp:  18 18   Temp:  98.4 F (36.9 C) 97.8 F (36.6 C) 98.5 F (36.9 C)  TempSrc:  Oral Oral Oral  SpO2: 98% 100% 100% 100%  Weight:      Height:        Intake/Output Summary (Last 24 hours) at 02/08/2019 0945 Last data filed at 02/08/2019 0630 Gross per 24 hour  Intake 3344.47 ml  Output 800 ml  Net 2544.47 ml   Filed Weights   02/07/19 0039 02/07/19 0331  Weight: 72.6 kg 70.1 kg   Exam  General: Well developed, well nourished, NAD, appears stated age  63: NCAT, PERRLA, EOMI, Anicteic Sclera, mucous membranes moist.  Poor dentition  Neck: Supple  Cardiovascular: S1 S2 auscultated, RRR, no murmur  Respiratory: Clear to auscultation bilaterally with equal chest rise  Abdomen: Soft, nontender, nondistended, + bowel sounds  Extremities: Bilateral BKA  Neuro: AAOx3, nonfocal  Psych: Appropriate mood and affect   Data Reviewed: I have personally reviewed following labs and imaging studies  CBC: Recent Labs  Lab 02/07/19 0107 02/07/19 0522 02/08/19 0500  WBC 10.4 9.5 10.3  NEUTROABS 6.5  --   --   HGB 9.1* 7.7* 8.2*  HCT 28.0* 24.7* 26.7*  MCV 94.3 95.7 99.6  PLT 228 193 557    Basic Metabolic Panel: Recent Labs  Lab 02/07/19 0107 02/07/19 0522 02/08/19 0500  NA 137 138 141  K 4.8 4.5 5.3*  CL 115* 120* 125*  CO2 15* 15* 12*  GLUCOSE 166* 109* 60*  BUN 36* 37* 31*  CREATININE 3.77* 3.48* 3.29*  CALCIUM 8.2* 7.9* 7.9*   GFR: Estimated Creatinine Clearance: 24 mL/min (A) (by C-G formula based on SCr of 3.29 mg/dL (H)). Liver Function Tests: Recent Labs  Lab 02/07/19 0107 02/07/19 0522  AST 12* 9*  ALT 6 6  ALKPHOS 59 52  BILITOT 0.1* 0.2*  PROT 6.4* 5.8*  ALBUMIN 3.2* 2.9*   No results for input(s): LIPASE, AMYLASE in the last 168 hours. No results for input(s): AMMONIA in the last 168 hours. Coagulation Profile: No results for input(s): INR, PROTIME in the last 168 hours. Cardiac Enzymes: No results for input(s): CKTOTAL, CKMB, CKMBINDEX, TROPONINI in the last 168 hours. BNP (last 3 results) No results for input(s): PROBNP in the last 8760 hours. HbA1C: Recent Labs    02/08/19 0500  HGBA1C 6.8*   CBG: Recent Labs  Lab 02/07/19 1711 02/07/19 2052 02/08/19 0755 02/08/19 0823 02/08/19 0907  GLUCAP 81 90 50* 43* 142*   Lipid Profile: No results for input(s): CHOL, HDL, LDLCALC, TRIG, CHOLHDL, LDLDIRECT in the last 72 hours. Thyroid Function Tests: No results  for input(s): TSH, T4TOTAL, FREET4, T3FREE, THYROIDAB in the last 72 hours. Anemia Panel: No results for input(s): VITAMINB12, FOLATE, FERRITIN, TIBC, IRON, RETICCTPCT in the last 72 hours. Urine analysis:    Component Value Date/Time   COLORURINE YELLOW 08/14/2018 1646   APPEARANCEUR CLEAR 08/14/2018 1646   LABSPEC 1.008 08/14/2018 1646   PHURINE 5.0 08/14/2018 1646   GLUCOSEU NEGATIVE 08/14/2018 1646   HGBUR MODERATE (A) 08/14/2018 1646   BILIRUBINUR NEGATIVE 08/14/2018 1646   KETONESUR NEGATIVE 08/14/2018 1646   PROTEINUR 30 (A) 08/14/2018 1646   UROBILINOGEN 1.0 02/26/2015 1152   NITRITE NEGATIVE 08/14/2018 1646   LEUKOCYTESUR NEGATIVE 08/14/2018 1646   Sepsis  Labs: @LABRCNTIP (procalcitonin:4,lacticidven:4)  )No results found for this or any previous visit (from the past 240 hour(s)).    Radiology Studies: US Renal  Result Date: 02/07/2019 CLINICAL DATA:  38 year old female with a history of acute kidney injury EXAM: RENAL / URINARY TRACT ULTRASOUND COMPLETE COMPARISON:  08/11/2018 FINDINGS: Right Kidney: Length: 10.6 cm x 4.9 cm x 4.9 cm. Pelvicaliectasis of the right kidney with fullness in the pelvis, appears new from the comparison ultrasound of 08/11/2018. Flow confirmed in the hilum of the right kidney. No cortical thinning. Left Kidney: Length: 11.5 cm x 5.0 cm x 4.8 cm, 143 cc. No evidence of left-sided hydronephrosis with similar appearance of the left kidney to the comparison ultrasound survey. Bladder: Echogenic debris within the urinary bladder. Left ureteral jet visualized. The right ureteral jet not visualized. IMPRESSION: Mild right-sided pelvicaliectasis, with no right ureteral jet visualized. This may indicate distal ureteral obstruction, and correlation with CT scan may be indicated. Unremarkable left kidney with no hydronephrosis. Echogenic debris layered within the urinary bladder, potentially blood products or stone debris. Electronically Signed   By: Corrie Mckusick D.O.   On: 02/07/2019 20:05     Scheduled Meds: . aspirin EC  81 mg Oral Daily  . atorvastatin  80 mg Oral q1800  . cloNIDine  0.1 mg Oral BID  . DULoxetine  90 mg Oral BH-q7a  . fluticasone furoate-vilanterol  1 puff Inhalation Daily  . gabapentin  300 mg Oral BID  . heparin  5,000 Units Subcutaneous Q8H  . insulin aspart  0-5 Units Subcutaneous QHS  . insulin aspart  0-9 Units Subcutaneous TID WC  . insulin glargine  20 Units Subcutaneous Q breakfast  . insulin glargine  30 Units Subcutaneous QHS  . lurasidone  80 mg Oral QHS  . mirtazapine  15 mg Oral QHS  . nicotine  21 mg Transdermal Daily  . pentoxifylline  400 mg Oral TID WC  . sodium zirconium  cyclosilicate  10 g Oral Once  . tamsulosin  0.4 mg Oral Daily   Continuous Infusions: . sodium chloride 125 mL/hr at 02/07/19 1350     LOS: 1 day   Time Spent in minutes   45 minutes (greater than 50% of time spent with patient face to face, as well as reviewing old records, discussing with consults, and formulating a plan)  Reather Steller D.O. on 02/08/2019 at 9:45 AM  Between 7am to 7pm - Please see pager noted on [email or web address]  After 7pm go to [email or web address]  And look for the night coverage person covering for me after hours  [facility] Office  [phone]

## 2019-02-08 NOTE — Progress Notes (Signed)
Pt requested that RN notify Dr. Ree Kida that she (the patient) would like to be discharged from the hospital since psychiatry had "mentally cleared" her.  RN explained to pt that her medical issues and concerns would be need to be addressed by the primary MD prior to discharge.  Pt expressed that she understood but had the right to sign out Against Medical Advice (AMA).  Dr. Ree Kida notified of the above.  Pt decided to sign out AMA and Dr. Ree Kida notified of pt's decision.  Pt signed AMA form and called for her transportation. Stacey Drain

## 2019-02-09 NOTE — Discharge Summary (Signed)
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 Jasmine Pang Curtisis a 38 y.o.femalewith medical history significant ofdiabetes with noncompliance, schizophrenia, hypertension, hyperlipidemia, bilateral BKA who was brought to the emergency room with suicidal ideation. Patient has not been taking her medications for her blood pressure and diabetes. She apparently was upset tonight and took gabapentin and emptied the bottle into her house threatening to take them to kill herself but never did. Significant other called 911 and she was brought to the emergency room. Patient voiced suicidal ideation in the emergency room. Also mention visual and auditory hallucinations consistent with her schizophrenia which has been going on for about a week. Some of the voices have been telling her to kill herself. She has thought about overdosing several times but did not move forward. No homicidal ideation. Patient has had a history of poor compliance with treatment. High evaluation in the ER showed acute on chronic renal failure with baseline creatinine is around 2 but currently she is more than 3. Psychiatric consultation was sought for however patient needs medical  clearance with her worsening renal function. She is therefore being admitted to the medical service. She denied any shortness of breath, no cough no nausea vomiting or diarrhea.  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  This may indicate distal utero obstruction, correlation with CT may be indicated.  Unremarkable left kidney with no hydronephrosis. -obtained CT- no urolithiasis or obstructive uropathy  Urinary retention -Patient states she only urinates once daily -Started Flomax  Suicidal ideation -Psychiatry consulted and appreciated, and initially recommended inpatient psychiatric hospitalization -History of schizophrenia and bipolar disorder -Continue Latuda, gabapentin (have renally adjusted doses), Remeron, Cymbalta -Pending urine drug screen -Had discussion with patient this morning, she states that she listened to her daughters arguing prior to admission and at that point was tired of it.  She grabbed a bottle of medications which she has not taken any in 4 months, for them into her hand instead I would like to end this in front of her daughters.  She stated that her daughter stopped her.  Currently she denies any suicidal thoughts or ideations or intentions. -Discussed with Dr. Mariea Clonts, psychiatry, no longer feels patient needs inpatient psych   Chronic normocytic anemia/anemia of chronic disease -Hemoglobin 9.1 on admission, as above is dropped to 7.7 -Suspect drop in hemoglobin due to dilutional component -Hemoglobin currently 7.8  Diabetes mellitus, type II complicated by episodes of hypoglycemia -Last hemoglobin A1c 8 on  08/11/2018 -was placed on insulin sliding scale with CBG monitoring -Hemoglobin A1c 6.8 -  Patient has not been eating much,  lantus dosage changed as her blood sugars have been dropping  Essential hypertension -Continue clonidine  Hyperlipidemia -Continue statin  Tobacco abuse -Continue nicotine patch  GERD -Continue PPI  Hyperkalemia -Potassium 5.3, will give dose of the Lokelma continue to monitor BMP  Consultants Psychiatry  Procedures  Renal US  Discharge Exam: Vitals:   02/08/19 0554 02/08/19 1323  BP: (!) 172/100 (!) 153/91  Pulse: 87 82  Resp:  11  Temp: 98.5 F (36.9 C) 98 F (36.7 C)  SpO2: 100% 100%    No exam- patient left AMA  Discharge Instructions  Allergies as of 02/08/2019      Reactions   Hydrocodone-acetaminophen Hives   Tylenol [acetaminophen] Itching, Swelling   Patient tolerated APAP 650 mg (06/29/18) as well as oxycodone/APAP 5-325 mg during 06/25/2018 admission.      Medication List    ASK your doctor about these medications   Advair Diskus 100-50 MCG/DOSE Aepb Generic drug:  Fluticasone-Salmeterol Inhale 1 puff into the lungs daily.   alprazolam 2 MG tablet Commonly known as:  XANAX Take 1 tablet (2 mg total) by mouth 4 (four) times daily as needed for sleep.   aspirin EC 81 MG tablet Take 1 tablet (81 mg total) by mouth daily.   atorvastatin 80 MG tablet Commonly known as:  LIPITOR Take 1 tablet (80 mg total) by mouth daily at 6 PM.   cloNIDine 0.1 MG tablet Commonly known as:  CATAPRES Take 0.1 mg by mouth 2 (two) times daily.   DULoxetine 30 MG capsule Commonly known as:  CYMBALTA Take 90 mg by mouth every morning.   gabapentin 800 MG tablet Commonly known as:  NEURONTIN Take 1 tablet (800 mg total) by mouth 2 (two) times daily.   insulin glargine 100 UNIT/ML injection Commonly known as:  Lantus Inject 0.15 mLs (15 Units total) into the skin at bedtime. 20 U in the morning and 30 U in the evening   INSULIN  SYRINGE .5CC/31GX5/16" 31G X 5/16" 0.5 ML Misc 1 each by Does not apply route QID.   Latuda 120 MG Tabs Generic drug:  Lurasidone HCl Take 120 mg by mouth at bedtime.   mirtazapine 15 MG tablet Commonly known as:  REMERON Take 15 mg by mouth at bedtime.   NovoLOG 100 UNIT/ML injection Generic drug:  insulin aspart Inject 0-30 Units into the skin 3 (three) times daily with meals. Sliding Scale:  >300 15 units < 300 do not use   pantoprazole 40 MG tablet Commonly known as:  PROTONIX Take 1 tablet (40 mg total) by mouth daily.   pentoxifylline 400 MG CR tablet Commonly known as:  TRENTAL Take 1 tablet (400 mg total) by mouth 3 (three) times daily with meals.   ProAir HFA 108 (90 Base) MCG/ACT inhaler Generic drug:  albuterol Inhale 2 puffs into the lungs every 6 (six) hours as needed for shortness of breath.      Allergies  Allergen Reactions   Hydrocodone-Acetaminophen Hives   Tylenol [Acetaminophen] Itching and Swelling    Patient tolerated APAP 650 mg (06/29/18) as well as oxycodone/APAP 5-325 mg during 06/25/2018 admission.      The results of significant diagnostics from this hospitalization (including imaging, microbiology, ancillary and laboratory) are listed below for reference.    Significant Diagnostic Studies: Ct Abdomen Pelvis Wo Contrast  Result Date: 02/08/2019 CLINICAL DATA:  Renal failure, possible distal right ureteral calculus EXAM: CT ABDOMEN AND PELVIS WITHOUT CONTRAST TECHNIQUE:  Multidetector CT imaging of the abdomen and pelvis was performed following the standard protocol without IV contrast. COMPARISON:  Renal ultrasound 02/07/2019 FINDINGS: Lower chest: No acute abnormality. Hepatobiliary: No focal liver abnormality is seen. Status post cholecystectomy. No biliary dilatation. Pancreas: Unremarkable. No pancreatic ductal dilatation or surrounding inflammatory changes. Spleen: Normal in size without focal abnormality. Adrenals/Urinary Tract: Adrenal  glands are unremarkable. Kidneys are normal, without renal calculi, focal lesion, or hydronephrosis. Bladder is unremarkable. Stomach/Bowel: Stomach is within normal limits. Appendix appears normal. No evidence of bowel wall thickening, distention, or inflammatory changes. Vascular/Lymphatic: Normal caliber abdominal aorta with mild atherosclerosis. No lymphadenopathy. Reproductive: Uterus and bilateral adnexa are unremarkable. Other: No abdominal wall hernia or abnormality. No abdominopelvic ascites. Musculoskeletal: No acute osseous abnormality. No aggressive osseous lesion. IMPRESSION: 1. No urolithiasis or obstructive uropathy. Electronically Signed   By: Kathreen Devoid   On: 02/08/2019 10:26   US Renal  Result Date: 02/07/2019 CLINICAL DATA:  38 year old female with a history of acute kidney injury EXAM: RENAL / URINARY TRACT ULTRASOUND COMPLETE COMPARISON:  08/11/2018 FINDINGS: Right Kidney: Length: 10.6 cm x 4.9 cm x 4.9 cm. Pelvicaliectasis of the right kidney with fullness in the pelvis, appears new from the comparison ultrasound of 08/11/2018. Flow confirmed in the hilum of the right kidney. No cortical thinning. Left Kidney: Length: 11.5 cm x 5.0 cm x 4.8 cm, 143 cc. No evidence of left-sided hydronephrosis with similar appearance of the left kidney to the comparison ultrasound survey. Bladder: Echogenic debris within the urinary bladder. Left ureteral jet visualized. The right ureteral jet not visualized. IMPRESSION: Mild right-sided pelvicaliectasis, with no right ureteral jet visualized. This may indicate distal ureteral obstruction, and correlation with CT scan may be indicated. Unremarkable left kidney with no hydronephrosis. Echogenic debris layered within the urinary bladder, potentially blood products or stone debris. Electronically Signed   By: Corrie Mckusick D.O.   On: 02/07/2019 20:05    Microbiology: No results found for this or any previous visit (from the past 240 hour(s)).    Labs: Basic Metabolic Panel: Recent Labs  Lab 02/07/19 0107 02/07/19 0522 02/08/19 0500  NA 137 138 141  K 4.8 4.5 5.3*  CL 115* 120* 125*  CO2 15* 15* 12*  GLUCOSE 166* 109* 60*  BUN 36* 37* 31*  CREATININE 3.77* 3.48* 3.29*  CALCIUM 8.2* 7.9* 7.9*   Liver Function Tests: Recent Labs  Lab 02/07/19 0107 02/07/19 0522  AST 12* 9*  ALT 6 6  ALKPHOS 59 52  BILITOT 0.1* 0.2*  PROT 6.4* 5.8*  ALBUMIN 3.2* 2.9*   No results for input(s): LIPASE, AMYLASE in the last 168 hours. No results for input(s): AMMONIA in the last 168 hours. CBC: Recent Labs  Lab 02/07/19 0107 02/07/19 0522 02/08/19 0500 02/08/19 1058  WBC 10.4 9.5 10.3  --   NEUTROABS 6.5  --   --   --   HGB 9.1* 7.7* 8.2* 7.8*  HCT 28.0* 24.7* 26.7* 24.5*  MCV 94.3 95.7 99.6  --   PLT 228 193 202  --    Cardiac Enzymes: No results for input(s): CKTOTAL, CKMB, CKMBINDEX, TROPONINI in the last 168 hours. BNP: BNP (last 3 results) No results for input(s): BNP in the last 8760 hours.  ProBNP (last 3 results) No results for input(s): PROBNP in the last 8760 hours.  CBG: Recent Labs  Lab 02/08/19 0755 02/08/19 0823 02/08/19 0907 02/08/19 1147 02/08/19 1431  GLUCAP 50* 43* 142* 118* 76  Signed:  Cristal Ford  [facility] 02/09/2019, 12:13 PM

## 2019-04-23 IMAGING — CR DG FOOT COMPLETE 3+V*R*
3 series · 3 of 3 positions shown · non-contrast
Comparison: Prior radiograph from 08/03/2008.

CLINICAL DATA: Initial evaluation for acute pain and soreness,
concern for infection. History of diabetes.

EXAM:
RIGHT FOOT COMPLETE - 3+ VIEW

[foot ap]
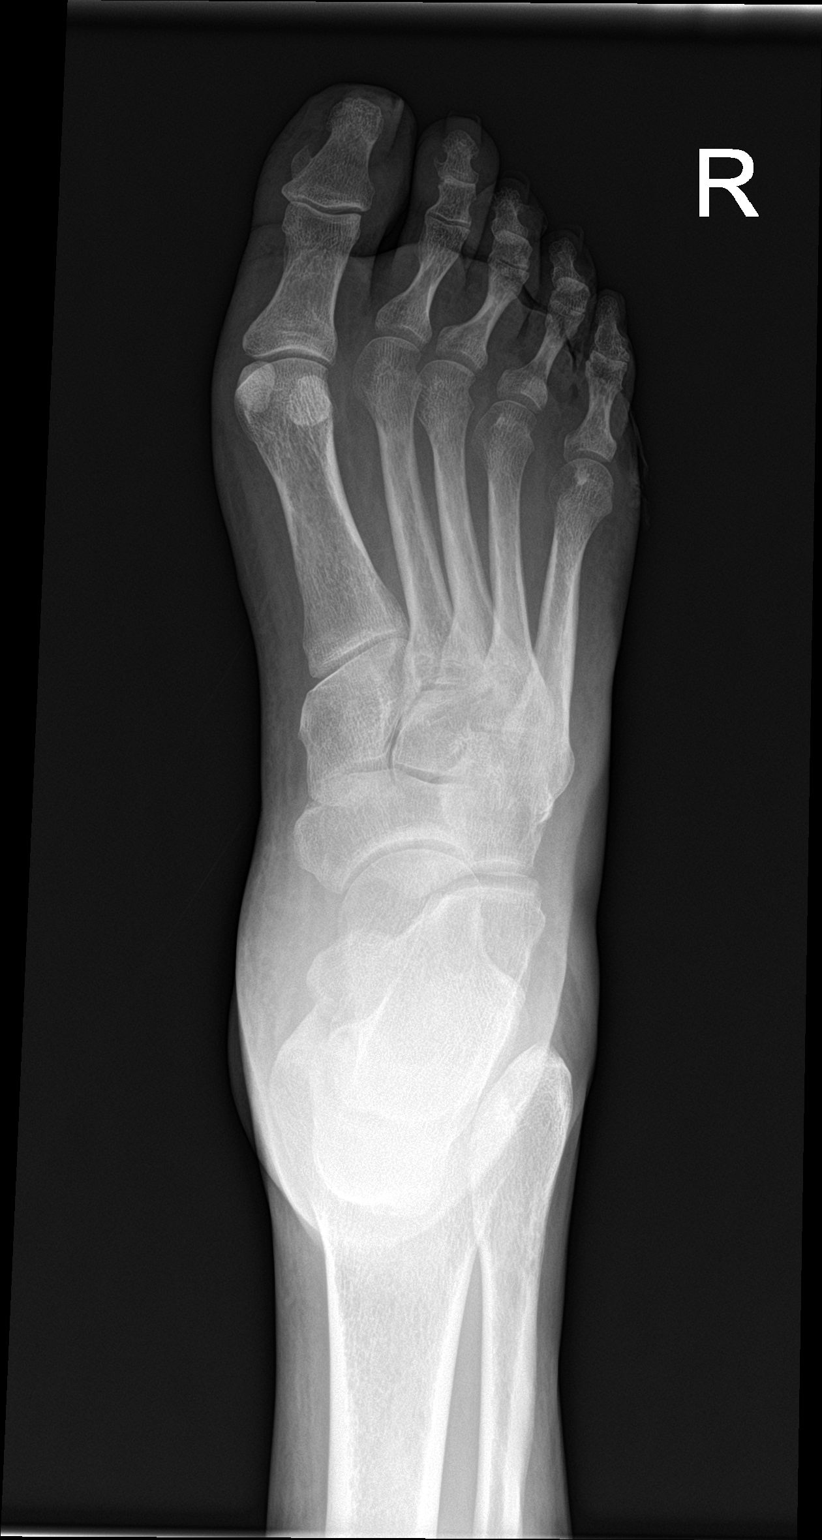

[foot obl]
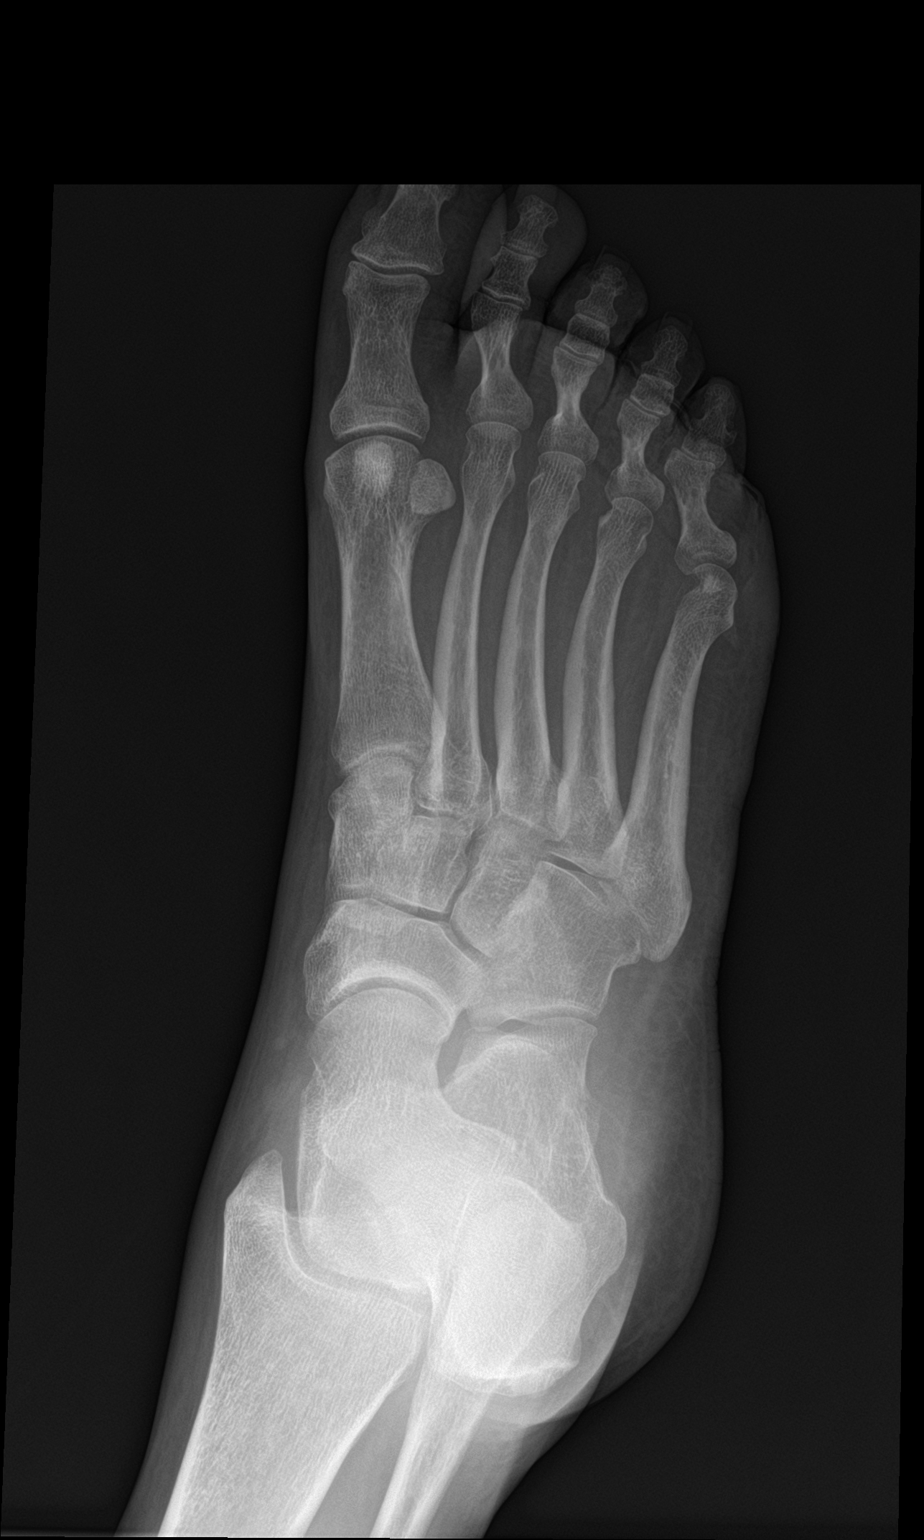

[foot lat]
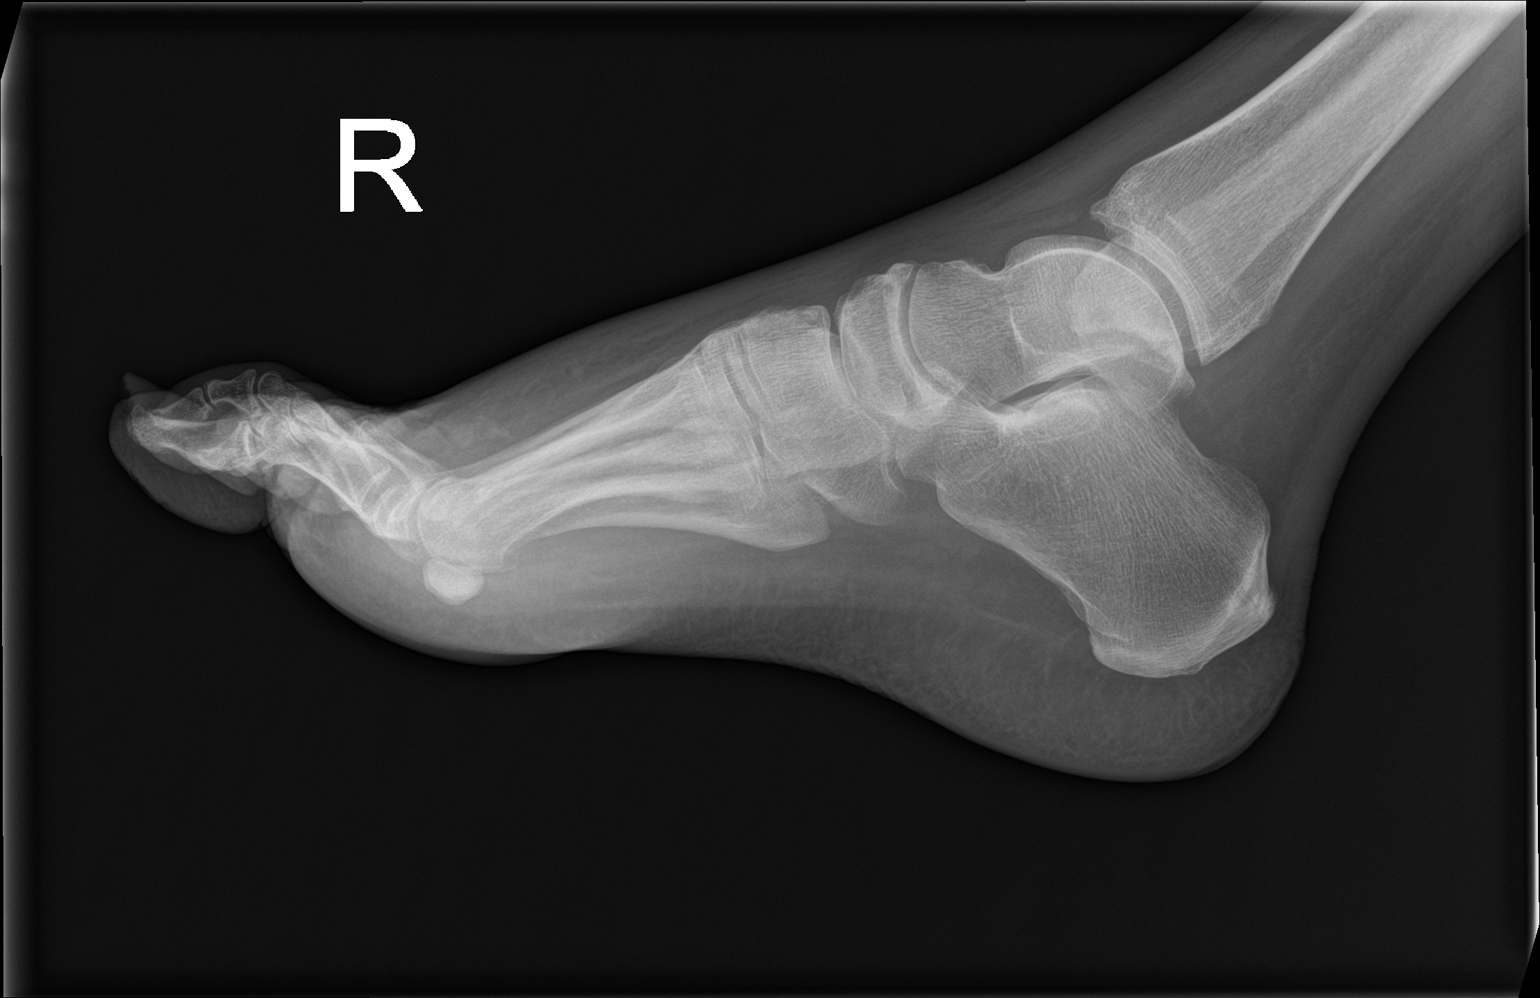

[3 of 3 positions shown; findings below may reference images not displayed]

FINDINGS: Soft tissue irregularity with ulceration overlies the lateral aspect
of the right forefoot at the level of the right fourth and fifth
digits. Probable involvement of the right third digit as well.
Findings concerning for possible soft tissue infection given
provided history. No convincing osseous erosions or periosteal
reaction to suggest acute osteomyelitis. No acute fracture or
dislocation. Mild scattered degenerative changes noted about the
foot and ankle.
IMPRESSION: 1. Soft tissue irregularity with ulceration involving the right for
foot, most evident at the right third through fifth digits. Findings
concerning for soft tissue infection/cellulitis given provided
history. No convincing radiographic evidence for active
osteomyelitis at this time.
2. No other acute osseous abnormality.

## 2019-04-30 ENCOUNTER — Other Ambulatory Visit: Payer: Self-pay

## 2019-04-30 ENCOUNTER — Emergency Department
Admission: EM | Admit: 2019-04-30 | Discharge: 2019-04-30 | Discharge status: Against Medical Advice | Payer: Medicaid Other | Attending: Emergency Medicine | Admitting: Emergency Medicine

## 2019-04-30 DIAGNOSIS — R1110 Vomiting, unspecified: Secondary | ICD-10-CM | POA: Insufficient documentation

## 2019-04-30 DIAGNOSIS — R1032 Left lower quadrant pain: Secondary | ICD-10-CM | POA: Diagnosis not present

## 2019-04-30 DIAGNOSIS — R1031 Right lower quadrant pain: Secondary | ICD-10-CM | POA: Diagnosis present

## 2019-04-30 DIAGNOSIS — Z5321 Procedure and treatment not carried out due to patient leaving prior to being seen by health care provider: Secondary | ICD-10-CM | POA: Diagnosis not present

## 2019-04-30 LAB — COMPREHENSIVE METABOLIC PANEL
ALT: 7 U/L (ref 0–44)
AST: 11 U/L — ABNORMAL LOW (ref 15–41)
Albumin: 2.8 g/dL — ABNORMAL LOW (ref 3.5–5.0)
Alkaline Phosphatase: 47 U/L (ref 38–126)
Anion gap: 8 (ref 5–15)
BUN: 48 mg/dL — ABNORMAL HIGH (ref 6–20)
CO2: 16 mmol/L — ABNORMAL LOW (ref 22–32)
Calcium: 8.1 mg/dL — ABNORMAL LOW (ref 8.9–10.3)
Chloride: 111 mmol/L (ref 98–111)
Creatinine, Ser: 4.96 mg/dL — ABNORMAL HIGH (ref 0.44–1.00)
GFR calc Af Amer: 12 mL/min — ABNORMAL LOW (ref >60)
GFR calc non Af Amer: 10 mL/min — ABNORMAL LOW (ref >60)
Glucose, Bld: 252 mg/dL — ABNORMAL HIGH (ref 70–99)
Potassium: 4 mmol/L (ref 3.5–5.1)
Sodium: 135 mmol/L (ref 135–145)
Total Bilirubin: 0.4 mg/dL (ref 0.3–1.2)
Total Protein: 6.3 g/dL — ABNORMAL LOW (ref 6.5–8.1)

## 2019-04-30 LAB — CBC
HCT: 26.5 % — ABNORMAL LOW (ref 36.0–46.0)
Hemoglobin: 8.8 g/dL — ABNORMAL LOW (ref 12.0–15.0)
MCH: 30 pg (ref 26.0–34.0)
MCHC: 33.2 g/dL (ref 30.0–36.0)
MCV: 90.4 fL (ref 80.0–100.0)
Platelets: 238 10*3/uL (ref 150–400)
RBC: 2.93 MIL/uL — ABNORMAL LOW (ref 3.87–5.11)
RDW: 12.9 % (ref 11.5–15.5)
WBC: 9.7 10*3/uL (ref 4.0–10.5)
nRBC: 0 % (ref 0.0–0.2)

## 2019-04-30 LAB — LIPASE, BLOOD: Lipase: 43 U/L (ref 11–51)

## 2019-04-30 NOTE — ED Notes (Signed)
Pt called from [facility] to treatment room, no response ?

## 2019-04-30 NOTE — ED Notes (Signed)
Pt called from [facility] to treatment room, no response 

## 2019-04-30 NOTE — ED Triage Notes (Signed)
Bilateral flank pain and emesis after eating X 3 days. Recent hospitalization for AKI. Told by PCP to come to ER today. Pt alert and oriented X4, active, cooperative, pt in NAD. RR even and unlabored, color WNL.

## 2019-05-01 ENCOUNTER — Telehealth: Payer: Self-pay | Admitting: Emergency Medicine

## 2019-05-01 NOTE — Telephone Encounter (Signed)
Called patient due to lwot to inquire about condition and follow up plans. Says she is still same, but couldn't wait that long.  She has not made plans to see a physician.  She does not have pcp. I told her she can always return here and that if she goes somewhere else, her labs are available.  I told her that she does have some abnormal labs, but she really needs to see a doctor.

## 2019-06-15 ENCOUNTER — Other Ambulatory Visit: Payer: Self-pay

## 2019-06-15 ENCOUNTER — Inpatient Hospital Stay (HOSPITAL_COMMUNITY)
Admission: EM | Admit: 2019-06-15 | Discharge: 2019-06-16 | DRG: 683 | Discharge status: Against Medical Advice | Payer: Medicare Other | Attending: Internal Medicine | Admitting: Internal Medicine

## 2019-06-15 ENCOUNTER — Encounter (HOSPITAL_COMMUNITY): Payer: Self-pay | Admitting: *Deleted

## 2019-06-15 ENCOUNTER — Emergency Department (HOSPITAL_COMMUNITY): Payer: Medicare Other

## 2019-06-15 ENCOUNTER — Observation Stay (HOSPITAL_COMMUNITY): Payer: Medicare Other

## 2019-06-15 DIAGNOSIS — E785 Hyperlipidemia, unspecified: Secondary | ICD-10-CM | POA: Diagnosis present

## 2019-06-15 DIAGNOSIS — Z9119 Patient's noncompliance with other medical treatment and regimen: Secondary | ICD-10-CM

## 2019-06-15 DIAGNOSIS — Z833 Family history of diabetes mellitus: Secondary | ICD-10-CM

## 2019-06-15 DIAGNOSIS — Z8249 Family history of ischemic heart disease and other diseases of the circulatory system: Secondary | ICD-10-CM

## 2019-06-15 DIAGNOSIS — N185 Chronic kidney disease, stage 5: Secondary | ICD-10-CM | POA: Diagnosis present

## 2019-06-15 DIAGNOSIS — F17210 Nicotine dependence, cigarettes, uncomplicated: Secondary | ICD-10-CM | POA: Diagnosis present

## 2019-06-15 DIAGNOSIS — N390 Urinary tract infection, site not specified: Secondary | ICD-10-CM | POA: Diagnosis present

## 2019-06-15 DIAGNOSIS — Z794 Long term (current) use of insulin: Secondary | ICD-10-CM

## 2019-06-15 DIAGNOSIS — F319 Bipolar disorder, unspecified: Secondary | ICD-10-CM | POA: Diagnosis present

## 2019-06-15 DIAGNOSIS — E872 Acidosis: Secondary | ICD-10-CM | POA: Diagnosis present

## 2019-06-15 DIAGNOSIS — E1151 Type 2 diabetes mellitus with diabetic peripheral angiopathy without gangrene: Secondary | ICD-10-CM | POA: Diagnosis present

## 2019-06-15 DIAGNOSIS — K219 Gastro-esophageal reflux disease without esophagitis: Secondary | ICD-10-CM | POA: Diagnosis present

## 2019-06-15 DIAGNOSIS — Z885 Allergy status to narcotic agent status: Secondary | ICD-10-CM

## 2019-06-15 DIAGNOSIS — Z20828 Contact with and (suspected) exposure to other viral communicable diseases: Secondary | ICD-10-CM | POA: Diagnosis present

## 2019-06-15 DIAGNOSIS — Z89512 Acquired absence of left leg below knee: Secondary | ICD-10-CM

## 2019-06-15 DIAGNOSIS — I120 Hypertensive chronic kidney disease with stage 5 chronic kidney disease or end stage renal disease: Secondary | ICD-10-CM | POA: Diagnosis present

## 2019-06-15 DIAGNOSIS — E11319 Type 2 diabetes mellitus with unspecified diabetic retinopathy without macular edema: Secondary | ICD-10-CM | POA: Diagnosis present

## 2019-06-15 DIAGNOSIS — D631 Anemia in chronic kidney disease: Secondary | ICD-10-CM | POA: Diagnosis present

## 2019-06-15 DIAGNOSIS — Z89511 Acquired absence of right leg below knee: Secondary | ICD-10-CM

## 2019-06-15 DIAGNOSIS — E1122 Type 2 diabetes mellitus with diabetic chronic kidney disease: Secondary | ICD-10-CM | POA: Diagnosis present

## 2019-06-15 DIAGNOSIS — F209 Schizophrenia, unspecified: Secondary | ICD-10-CM | POA: Diagnosis present

## 2019-06-15 DIAGNOSIS — N189 Chronic kidney disease, unspecified: Secondary | ICD-10-CM | POA: Diagnosis present

## 2019-06-15 DIAGNOSIS — N179 Acute kidney failure, unspecified: Principal | ICD-10-CM | POA: Diagnosis present

## 2019-06-15 DIAGNOSIS — Z886 Allergy status to analgesic agent status: Secondary | ICD-10-CM

## 2019-06-15 DIAGNOSIS — J45909 Unspecified asthma, uncomplicated: Secondary | ICD-10-CM | POA: Diagnosis present

## 2019-06-15 DIAGNOSIS — I69311 Memory deficit following cerebral infarction: Secondary | ICD-10-CM

## 2019-06-15 DIAGNOSIS — Z7982 Long term (current) use of aspirin: Secondary | ICD-10-CM

## 2019-06-15 DIAGNOSIS — D509 Iron deficiency anemia, unspecified: Secondary | ICD-10-CM | POA: Diagnosis present

## 2019-06-15 DIAGNOSIS — G8929 Other chronic pain: Secondary | ICD-10-CM | POA: Diagnosis present

## 2019-06-15 LAB — CBC
HCT: 23.3 % — ABNORMAL LOW (ref 36.0–46.0)
HCT: 23.6 % — ABNORMAL LOW (ref 36.0–46.0)
Hemoglobin: 7.5 g/dL — ABNORMAL LOW (ref 12.0–15.0)
Hemoglobin: 7.6 g/dL — ABNORMAL LOW (ref 12.0–15.0)
MCH: 30.5 pg (ref 26.0–34.0)
MCH: 31.3 pg (ref 26.0–34.0)
MCHC: 31.8 g/dL (ref 30.0–36.0)
MCHC: 32.6 g/dL (ref 30.0–36.0)
MCV: 95.9 fL (ref 80.0–100.0)
MCV: 95.9 fL (ref 80.0–100.0)
Platelets: 260 10*3/uL (ref 150–400)
Platelets: 261 10*3/uL (ref 150–400)
RBC: 2.43 MIL/uL — ABNORMAL LOW (ref 3.87–5.11)
RBC: 2.46 MIL/uL — ABNORMAL LOW (ref 3.87–5.11)
RDW: 13.2 % (ref 11.5–15.5)
RDW: 13.2 % (ref 11.5–15.5)
WBC: 10.6 10*3/uL — ABNORMAL HIGH (ref 4.0–10.5)
WBC: 10.6 10*3/uL — ABNORMAL HIGH (ref 4.0–10.5)
nRBC: 0 % (ref 0.0–0.2)
nRBC: 0 % (ref 0.0–0.2)

## 2019-06-15 LAB — SARS CORONAVIRUS 2 BY RT PCR (HOSPITAL ORDER, PERFORMED IN ~~LOC~~ HOSPITAL LAB): SARS Coronavirus 2: NEGATIVE

## 2019-06-15 LAB — HEMOGLOBIN A1C
Hgb A1c MFr Bld: 6.4 % — ABNORMAL HIGH (ref 4.8–5.6)
Mean Plasma Glucose: 136.98 mg/dL

## 2019-06-15 LAB — URINALYSIS, ROUTINE W REFLEX MICROSCOPIC
Bilirubin Urine: NEGATIVE
Glucose, UA: NEGATIVE mg/dL
Hgb urine dipstick: NEGATIVE
Ketones, ur: NEGATIVE mg/dL
Leukocytes,Ua: NEGATIVE
Nitrite: NEGATIVE
Protein, ur: 100 mg/dL — AB
Specific Gravity, Urine: 1.012 (ref 1.005–1.030)
pH: 8 (ref 5.0–8.0)

## 2019-06-15 LAB — DIFFERENTIAL
Abs Immature Granulocytes: 0.09 10*3/uL — ABNORMAL HIGH (ref 0.00–0.07)
Basophils Absolute: 0 10*3/uL (ref 0.0–0.1)
Basophils Relative: 0 %
Eosinophils Absolute: 0.4 10*3/uL (ref 0.0–0.5)
Eosinophils Relative: 4 %
Immature Granulocytes: 1 %
Lymphocytes Relative: 19 %
Lymphs Abs: 2 10*3/uL (ref 0.7–4.0)
Monocytes Absolute: 0.6 10*3/uL (ref 0.1–1.0)
Monocytes Relative: 6 %
Neutro Abs: 7.5 10*3/uL (ref 1.7–7.7)
Neutrophils Relative %: 70 %

## 2019-06-15 LAB — COMPREHENSIVE METABOLIC PANEL
ALT: 5 U/L (ref 0–44)
AST: 8 U/L — ABNORMAL LOW (ref 15–41)
Albumin: 2.6 g/dL — ABNORMAL LOW (ref 3.5–5.0)
Alkaline Phosphatase: 70 U/L (ref 38–126)
Anion gap: 10 (ref 5–15)
BUN: 44 mg/dL — ABNORMAL HIGH (ref 6–20)
CO2: 18 mmol/L — ABNORMAL LOW (ref 22–32)
Calcium: 7.8 mg/dL — ABNORMAL LOW (ref 8.9–10.3)
Chloride: 111 mmol/L (ref 98–111)
Creatinine, Ser: 5.42 mg/dL — ABNORMAL HIGH (ref 0.44–1.00)
GFR calc Af Amer: 11 mL/min — ABNORMAL LOW (ref >60)
GFR calc non Af Amer: 9 mL/min — ABNORMAL LOW (ref >60)
Glucose, Bld: 201 mg/dL — ABNORMAL HIGH (ref 70–99)
Potassium: 4.7 mmol/L (ref 3.5–5.1)
Sodium: 139 mmol/L (ref 135–145)
Total Bilirubin: 0.3 mg/dL (ref 0.3–1.2)
Total Protein: 6.2 g/dL — ABNORMAL LOW (ref 6.5–8.1)

## 2019-06-15 LAB — LIPID PANEL
Cholesterol: 101 mg/dL (ref 0–200)
HDL: 31 mg/dL — ABNORMAL LOW (ref >40)
LDL Cholesterol: 57 mg/dL (ref 0–99)
Total CHOL/HDL Ratio: 3.3 RATIO
Triglycerides: 66 mg/dL (ref <150)
VLDL: 13 mg/dL (ref 0–40)

## 2019-06-15 LAB — FERRITIN: Ferritin: 39 ng/mL (ref 11–307)

## 2019-06-15 LAB — PROTEIN / CREATININE RATIO, URINE
Creatinine, Urine: 117.45 mg/dL
Protein Creatinine Ratio: 0.94 mg/mg{Cre} — ABNORMAL HIGH (ref 0.00–0.15)
Total Protein, Urine: 110 mg/dL

## 2019-06-15 LAB — IRON AND TIBC
Iron: 28 ug/dL (ref 28–170)
Saturation Ratios: 11 % (ref 10.4–31.8)
TIBC: 262 ug/dL (ref 250–450)
UIBC: 234 ug/dL

## 2019-06-15 LAB — NA AND K (SODIUM & POTASSIUM), RAND UR
Potassium Urine: 16 mmol/L
Sodium, Ur: 60 mmol/L

## 2019-06-15 LAB — I-STAT BETA HCG BLOOD, ED (MC, WL, AP ONLY): I-stat hCG, quantitative: <5 m[IU]/mL (ref <5)

## 2019-06-15 LAB — LIPASE, BLOOD: Lipase: 30 U/L (ref 11–51)

## 2019-06-15 LAB — GLUCOSE, CAPILLARY: Glucose-Capillary: 128 mg/dL — ABNORMAL HIGH (ref 70–99)

## 2019-06-15 LAB — CBG MONITORING, ED: Glucose-Capillary: 110 mg/dL — ABNORMAL HIGH (ref 70–99)

## 2019-06-15 LAB — MAGNESIUM: Magnesium: 1.7 mg/dL (ref 1.7–2.4)

## 2019-06-15 MED ORDER — ONDANSETRON HCL 4 MG/2ML IJ SOLN: 4.0000 mg | Freq: Four times a day (QID) | INTRAMUSCULAR | Status: DC | PRN

## 2019-06-15 MED ORDER — ASPIRIN EC 81 MG PO TBEC
81.0000 mg | DELAYED_RELEASE_TABLET | Freq: Every day | ORAL | Status: DC
  Administered 2019-06-16: 81 mg via ORAL
  Filled 2019-06-15 (×2): qty 1

## 2019-06-15 MED ORDER — STERILE WATER FOR INJECTION IV SOLN
INTRAVENOUS | Status: DC
  Administered 2019-06-15 – 2019-06-16 (×2): via INTRAVENOUS
  Filled 2019-06-15 (×2): qty 850

## 2019-06-15 MED ORDER — CLONIDINE HCL 0.1 MG PO TABS
0.1000 mg | ORAL_TABLET | Freq: Two times a day (BID) | ORAL | Status: DC
  Administered 2019-06-15: 0.1 mg via ORAL
  Filled 2019-06-15: qty 1

## 2019-06-15 MED ORDER — SODIUM CHLORIDE 0.9% FLUSH
3.0000 mL | Freq: Once | INTRAVENOUS | Status: AC
  Administered 2019-06-15: 3 mL via INTRAVENOUS

## 2019-06-15 MED ORDER — FENTANYL CITRATE (PF) 100 MCG/2ML IJ SOLN
25.0000 ug | Freq: Once | INTRAMUSCULAR | Status: AC
  Administered 2019-06-15: 25 ug via INTRAVENOUS
  Filled 2019-06-15: qty 2

## 2019-06-15 MED ORDER — ATORVASTATIN CALCIUM 80 MG PO TABS
80.0000 mg | ORAL_TABLET | Freq: Every day | ORAL | Status: DC
  Filled 2019-06-15: qty 1

## 2019-06-15 MED ORDER — INSULIN ASPART 100 UNIT/ML ~~LOC~~ SOLN: 0.0000 [IU] | Freq: Every day | SUBCUTANEOUS | Status: DC

## 2019-06-15 MED ORDER — OXYCODONE-ACETAMINOPHEN 5-325 MG PO TABS
1.0000 | ORAL_TABLET | Freq: Once | ORAL | Status: AC
  Administered 2019-06-15: 1 via ORAL
  Filled 2019-06-15: qty 1

## 2019-06-15 MED ORDER — ONDANSETRON HCL 4 MG PO TABS: 4.0000 mg | ORAL_TABLET | Freq: Four times a day (QID) | ORAL | Status: DC | PRN

## 2019-06-15 MED ORDER — AMLODIPINE BESYLATE 5 MG PO TABS
5.0000 mg | ORAL_TABLET | Freq: Every day | ORAL | Status: DC
  Administered 2019-06-15 – 2019-06-16 (×2): 5 mg via ORAL
  Filled 2019-06-15 (×3): qty 1

## 2019-06-15 MED ORDER — MORPHINE SULFATE (PF) 2 MG/ML IV SOLN
2.0000 mg | Freq: Once | INTRAVENOUS | Status: AC
  Administered 2019-06-15: 2 mg via INTRAVENOUS
  Filled 2019-06-15: qty 1

## 2019-06-15 MED ORDER — NICOTINE 21 MG/24HR TD PT24
21.0000 mg | MEDICATED_PATCH | Freq: Once | TRANSDERMAL | Status: DC
  Administered 2019-06-15: 21 mg via TRANSDERMAL
  Filled 2019-06-15: qty 1

## 2019-06-15 MED ORDER — ALPRAZOLAM 0.5 MG PO TABS: 1.0000 mg | ORAL_TABLET | Freq: Three times a day (TID) | ORAL | Status: DC | PRN

## 2019-06-15 MED ORDER — INSULIN ASPART 100 UNIT/ML ~~LOC~~ SOLN: 0.0000 [IU] | Freq: Three times a day (TID) | SUBCUTANEOUS | Status: DC

## 2019-06-15 MED ORDER — NICOTINE 21 MG/24HR TD PT24: 21.0000 mg | MEDICATED_PATCH | Freq: Once | TRANSDERMAL | Status: DC

## 2019-06-15 MED ORDER — LURASIDONE HCL 20 MG PO TABS
20.0000 mg | ORAL_TABLET | Freq: Every day | ORAL | Status: DC
  Administered 2019-06-16: 20 mg via ORAL
  Filled 2019-06-15 (×2): qty 1

## 2019-06-15 NOTE — Progress Notes (Signed)
Patient threatening to go AMA if her daughter can not stay with her. Explained policy to her and she started  getting agitated.  Someone in the ER told her that her daughter can stay with her at all times.  Patient very hostile to staff. Patient also complaining of severe pain in her lower back. MD on call paged.

## 2019-06-15 NOTE — ED Notes (Signed)
Patient states fentanyl was not effective. Patient requests dilaudid. Provider will be notified.

## 2019-06-15 NOTE — ED Notes (Signed)
Bladder scan 85 mls 

## 2019-06-15 NOTE — ED Notes (Signed)
Patient reports that she only urinates once a month

## 2019-06-15 NOTE — ED Notes (Signed)
Patient transported to Ultrasound 

## 2019-06-15 NOTE — H&P (Addendum)
History and Physical    Kathy Phelps [id number] DOB: [birth year] DOA: 06/15/2019  PCP: Patient, No Pcp Per  Patient coming from: [facility] I have personally briefly reviewed patient's old medical records in [place]  Chief Complaint: Worsening kidney function  HPI: Kathy Phelps is a 38 y.o. female with medical history significant of hypertension, type 2 diabetes mellitus, hyperlipidemia, bilateral below-knee amputation, tobacco abuse presents to emergency department due to concern of worsening kidney function.  Patient reports that she went to see nephrologist today at [facility] and where she was told that her kidney function is worsening and she needs to go to ED for further evaluation and management.  Patient reports bilateral flank pain since 1 month, greenish vomiting and decreased urinary output.  Patient reports that she pees once in a month.  She has uncontrolled diabetes and smokes 1 pack of cigarettes per day.  She denies chest pain, shortness of breath, palpitation, decreased appetite, fever chills, decreased appetite.  Denies alcohol, illicit drug use.  Reports that her blood sugar was 217 this morning.  She is compliant with her insulin regime.  She denies numbness tingling sensation, ulcers or sores.  ED Course: Patient lying comfortably on the bed.  Vitals stable.  Reports that she would not stay in the hospital if she is not allowed to smoke cigarettes while she is hospitalized.  Counseled in details-she agreed with nicotine patch.   Review of Systems: As per HPI otherwise negative.    Past Medical History:  Diagnosis Date  . Anxiety   . Asthma   . Bipolar affective ([place])   . Complete miscarriage   . Constipation   . 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 Depression   . Diabetes mellitus    Type II  . Dyspnea     " only at night and I stop breathing in my sleep too for about the last three weeks"  . GERD  (gastroesophageal reflux disease)   . Headache    "migraraines"  . Hyperlipidemia   . Hypertension   . Osteomyelitis ([place])    left foot  . Pregnancy complication    HELP Syndrome  . Renal disorder   . Schizophrenia ([place])   . Stroke [facility])    " mild stroke", memory loss- approx 2017    Past Surgical History:  Procedure Laterality Date  . ABDOMINAL AORTOGRAM N/A 09/07/2017   Procedure: ABDOMINAL AORTOGRAM;  Surgeon: Waynetta Sandy, MD;  Location: [place] [facility];  Service: Cardiovascular;  Laterality: N/A;  . ABDOMINAL AORTOGRAM W/LOWER EXTREMITY N/A 02/03/2017   Procedure: Abdominal Aortogram w/Lower Extremity;  Surgeon: Waynetta Sandy, MD;  Location: [place] [facility];  Service: Cardiovascular;  Laterality: N/A;  . ABDOMINAL AORTOGRAM W/LOWER EXTREMITY N/A 06/27/2018   Procedure: ABDOMINAL AORTOGRAM W/LOWER EXTREMITY;  Surgeon: Serafina Mitchell, MD;  Location: [place] [facility];  Service: Cardiovascular;  Laterality: N/A;  . AMPUTATION Left 02/10/2017   Procedure: AMPUTATION TOES 3, 4 AND 5  LEFT FOOT;  Surgeon: Waynetta Sandy, MD;  Location: [place];  Service: Vascular;  Laterality: Left;  . AMPUTATION Left 12/21/2017   Procedure: LEFT FOOT 5TH RAY AMPUTATION;  Surgeon: Newt Minion, MD;  Location: [place];  Service: Orthopedics;  Laterality: Left;  . AMPUTATION Left 02/01/2018   Procedure: LEFT BELOW KNEE AMPUTATION;  Surgeon: Newt Minion, MD;  Location: [place];  Service: Orthopedics;  Laterality:  Left;  . AMPUTATION Right 08/11/2018   Procedure: AMPUTATION BELOW KNEE;  Surgeon: Newt Minion, MD;  Location: [place];  Service: Orthopedics;  Laterality: Right;  . CESAREAN SECTION    . CHOLECYSTECTOMY    . LOWER EXTREMITY ANGIOGRAPHY Bilateral 09/07/2017   Procedure: Lower Extremity Angiography;  Surgeon: Waynetta Sandy, MD;  Location: [name] [facility];  Service: Cardiovascular;  Laterality: Bilateral;  . PERIPHERAL VASCULAR ATHERECTOMY   02/03/2017   Procedure: Peripheral Vascular Atherectomy;  Surgeon: Waynetta Sandy, MD;  Location: [place] [facility];  Service: Cardiovascular;;  . PERIPHERAL VASCULAR ATHERECTOMY Right 06/27/2018   Procedure: PERIPHERAL VASCULAR ATHERECTOMY;  Surgeon: Serafina Mitchell, MD;  Location: [place] [facility];  Service: Cardiovascular;  Laterality: Right;  superficial femoral  . PERIPHERAL VASCULAR BALLOON ANGIOPLASTY  02/03/2017   Procedure: Peripheral Vascular Balloon Angioplasty;  Surgeon: Waynetta Sandy, MD;  Location: [place] [facility];  Service: Cardiovascular;;  . PERIPHERAL VASCULAR INTERVENTION Left 09/07/2017   Procedure: PERIPHERAL VASCULAR INTERVENTION;  Surgeon: Waynetta Sandy, MD;  Location: [place] [facility];  Service: Cardiovascular;  Laterality: Left;  SFA/POPLITEAL  . STUMP REVISION Left 05/24/2018  . STUMP REVISION Left 05/24/2018   Procedure: REVISION LEFT BELOW KNEE AMPUTATION;  Surgeon: Newt Minion, MD;  Location: [place];  Service: Orthopedics;  Laterality: Left;  . TUBAL LIGATION       reports that she has been smoking cigarettes. She has a 28.00 pack-year smoking history. She has never used smokeless tobacco. She reports that she does not drink alcohol or use drugs.  Allergies  Allergen Reactions  . Hydrocodone-Acetaminophen Hives  . Tylenol [Acetaminophen] Itching and Swelling    Patient tolerated APAP 650 mg (06/29/18) as well as oxycodone/APAP 5-325 mg during 06/25/2018 admission.    Family History  Problem Relation Age of Onset  . Diabetes Mellitus II Mother   . Heart disease Mother   . Heart disease Father     Prior to Admission medications   Medication Sig Start Date End Date Taking? Authorizing Provider  ADVAIR DISKUS 100-50 MCG/DOSE AEPB Inhale 1 puff into the lungs daily. 01/10/15   [provider]  alprazolam Duanne Moron) 2 MG tablet Take 1 tablet (2 mg total) by mouth 4 (four) times daily as needed for sleep. Patient taking  differently: Take 2 mg by mouth as directed. 4 times daily and at night if needed for sleep 08/12/17   Debbe Odea, MD  aspirin EC 81 MG tablet Take 1 tablet (81 mg total) by mouth daily. 07/03/18 07/03/19  [name], MD  atorvastatin (LIPITOR) 80 MG tablet Take 1 tablet (80 mg total) by mouth daily at 6 PM. 07/03/18   [name], MD  cloNIDine (CATAPRES) 0.1 MG tablet Take 0.1 mg by mouth 2 (two) times daily. 05/12/18   [provider]  DULoxetine (CYMBALTA) 30 MG capsule Take 90 mg by mouth every morning.  06/09/18   [provider]  gabapentin (NEURONTIN) 800 MG tablet Take 1 tablet (800 mg total) by mouth 2 (two) times daily. Patient taking differently: Take 800 mg by mouth 5 (five) times daily.  02/11/17   Hosie Poisson, MD  insulin glargine (LANTUS) 100 UNIT/ML injection Inject 0.15 mLs (15 Units total) into the skin at bedtime. 20 U in the morning and 30 U in the evening Patient taking differently: Inject 20-30 Units into the skin as directed. 20 U in the morning and 30 U in the evening 08/15/18  Bonnielee Haff, MD  Insulin Syringe-Needle U-100 (INSULIN SYRINGE .5CC/31GX5/16") 31G X 5/16" 0.5 ML MISC 1 each by Does not apply route QID. 08/12/17   Gherghe, Costin M, MD  LATUDA 120 MG TABS Take 120 mg by mouth at bedtime. 04/12/16   [provider]  mirtazapine (REMERON) 15 MG tablet Take 15 mg by mouth at bedtime. 12/22/17   [provider]  NOVOLOG 100 UNIT/ML injection Inject 0-30 Units into the skin 3 (three) times daily with meals. Sliding Scale:  >300 15 units < 300 do not use 08/12/17   Gherghe, Vella Redhead, MD  pantoprazole (PROTONIX) 40 MG tablet Take 1 tablet (40 mg total) by mouth daily. Patient taking differently: Take 40 mg by mouth daily as needed (reflux).  02/11/17   Hosie Poisson, MD  pentoxifylline (TRENTAL) 400 MG CR tablet Take 1 tablet (400 mg total) by mouth 3 (three) times daily with meals. 01/16/18   Newt Minion, MD  PROAIR HFA 108 [phone]  BASE) MCG/ACT inhaler Inhale 2 puffs into the lungs every 6 (six) hours as needed for shortness of breath.  01/10/15   [provider]    Physical Exam: Vitals:   06/15/19 1415 06/15/19 1430 06/15/19 1445 06/15/19 1500  BP: (!) 150/88 137/86 (!) 144/85 (!) 132/95  Pulse:      Resp:      Temp:      TempSrc:      SpO2:      Weight:      Height:        Constitutional: NAD, calm, comfortable Vitals:   06/15/19 1415 06/15/19 1430 06/15/19 1445 06/15/19 1500  BP: (!) 150/88 137/86 (!) 144/85 (!) 132/95  Pulse:      Resp:      Temp:      TempSrc:      SpO2:      Weight:      Height:       Eyes: PERRL, lids and conjunctivae normal ENMT: Mucous membranes are moist. Posterior pharynx clear of any exudate or lesions.Normal dentition.  Neck: normal, supple, no masses, no thyromegaly Respiratory: clear to auscultation bilaterally, no wheezing, no crackles. Normal respiratory effort. No accessory muscle use.  Cardiovascular: Regular rate and rhythm, no murmurs / rubs / gallops. Abdomen: no tenderness, no masses palpated. No hepatosplenomegaly. Bowel sounds positive.  Musculoskeletal: no clubbing / cyanosis.  Bilateral below-knee amputation noted.  No signs of infection or redness noted.  Skin: no rashes, lesions, ulcers. No induration Neurologic: CN 2-12 grossly intact. Sensation intact, DTR normal. Strength 5/5 in all 4.  Psychiatric: Normal judgment and insight. Alert and oriented x 3. Normal mood.    Labs on Admission: I have personally reviewed following labs and imaging studies  CBC: Recent Labs  Lab 06/15/19 1158  WBC 10.6*  10.6*  NEUTROABS 7.5  HGB 7.6*  7.5*  HCT 23.3*  23.6*  MCV 95.9  95.9  PLT 260  0000000   Basic Metabolic Panel: Recent Labs  Lab 06/15/19 1158  NA 139  K 4.7  CL 111  CO2 18*  GLUCOSE 201*  BUN 44*  CREATININE 5.42*  CALCIUM 7.8*   GFR: Estimated Creatinine Clearance: 14.6 mL/min (A) (by C-G formula based on SCr of 5.42 mg/dL  (H)). Liver Function Tests: Recent Labs  Lab 06/15/19 1158  AST 8*  ALT 5  ALKPHOS 70  BILITOT 0.3  PROT 6.2*  ALBUMIN 2.6*   Recent Labs  Lab 06/15/19 1158  LIPASE 30  No results for input(s): AMMONIA in the last 168 hours. Coagulation Profile: No results for input(s): INR, PROTIME in the last 168 hours. Cardiac Enzymes: No results for input(s): CKTOTAL, CKMB, CKMBINDEX, TROPONINI in the last 168 hours. BNP (last 3 results) No results for input(s): PROBNP in the last 8760 hours. HbA1C: No results for input(s): HGBA1C in the last 72 hours. CBG: No results for input(s): GLUCAP in the last 168 hours. Lipid Profile: No results for input(s): CHOL, HDL, LDLCALC, TRIG, CHOLHDL, LDLDIRECT in the last 72 hours. Thyroid Function Tests: No results for input(s): TSH, T4TOTAL, FREET4, T3FREE, THYROIDAB in the last 72 hours. Anemia Panel: No results for input(s): VITAMINB12, FOLATE, FERRITIN, TIBC, IRON, RETICCTPCT in the last 72 hours. Urine analysis:    Component Value Date/Time   COLORURINE YELLOW 08/14/2018 [place] 08/14/2018 1646   LABSPEC 1.008 08/14/2018 1646   PHURINE 5.0 08/14/2018 1646   GLUCOSEU NEGATIVE 08/14/2018 1646   HGBUR MODERATE (A) 08/14/2018 1646   BILIRUBINUR NEGATIVE 08/14/2018 1646   KETONESUR NEGATIVE 08/14/2018 1646   PROTEINUR 30 (A) 08/14/2018 1646   UROBILINOGEN 1.0 02/26/2015 1152   NITRITE NEGATIVE 08/14/2018 1646   LEUKOCYTESUR NEGATIVE 08/14/2018 1646    Radiological Exams on Admission: Ct Abdomen Pelvis Wo Contrast  Result Date: 06/15/2019 CLINICAL DATA:  Lower abdominal pain for 5 days. EXAM: CT ABDOMEN AND PELVIS WITHOUT CONTRAST TECHNIQUE: Multidetector CT imaging of the abdomen and pelvis was performed following the standard protocol without IV contrast. COMPARISON:  CT abdomen and pelvis 02/08/2019. FINDINGS: Lower chest: Mild dependent atelectasis. No pleural or pericardial effusion. Hepatobiliary: No focal liver  abnormality is seen. Status post cholecystectomy. No biliary dilatation. Pancreas: Unremarkable. No pancreatic ductal dilatation or surrounding inflammatory changes. Spleen: Normal in size without focal abnormality. Adrenals/Urinary Tract: Adrenal glands are unremarkable. Kidneys are normal, without renal calculi, focal lesion, or hydronephrosis. Bladder is unremarkable. Stomach/Bowel: Stomach is within normal limits. Appendix appears normal. No evidence of bowel wall thickening, distention, or inflammatory changes. Vascular/Lymphatic: Aortic atherosclerosis. No enlarged abdominal or pelvic lymph nodes. Reproductive: Uterus and bilateral adnexa are unremarkable. Other: None. Musculoskeletal: No acute or focal abnormality. IMPRESSION: No acute abnormality or finding to explain the patient's symptoms. Negative for urinary tract stone. Atherosclerosis. Electronically Signed   By: Inge Rise M.D.   On: 06/15/2019 13:57    Assessment/Plan Active Problems:   Acute on chronic renal failure (HCC)   Acute on chronic renal failure stage V: -Likely secondary to underlying chronic hypertension and diabetes mellitus. -Elevated creatinine and GFR is 5.  Potassium is WNL, she is not in fluid overload. -Renal ultrasound, UA- ordered and is pending. -Consulted nephrology for further management. -She needs access for hemodialysis-if her kidney function continues to worse.  Anemia of chronic disease: -Likely secondary to underlying chronic kidney disease. -H&H is on lower side.  No signs of active bleeding. -Check iron studies -Monitor H&H closely.  Metabolic acidosis: -Secondary to underlying worsening kidney function -On IV bicarb drip  Hypertension: Blood pressure is currently stable. -We will continue her home medication amlodipine and clonidine and continue to monitor her blood pressure. -We will check lipid panel today.  Diabetes mellitus: -We will check A1c and start patient on sliding scale  insulin. -Monitor blood sugar closely.  Bilateral below-knee amputation: -Secondary to diabetes foot infection. -No signs of infection or bleeding noted on exam.  Anxiety & bipolar disorder: -Continue home meds of Xanax and Latuda.  Tobacco abuse: -Counseled patient regarding cessation she is  not ready to quit yet. -We will order nicotine patch.  DVT prophylaxis: Encourage ambulation  code Status: Full code Family Communication:None present at bedside.  Plan of care discussed with patient in length and he verbalized understanding and agreed with it. Disposition Plan: Home Consults called: Nephrologist Dr. Carolin Sicks  admission status: Observation   Mckinley Jewel MD [facility] Pager [phone]  If 7PM-7AM, please contact night-coverage [email or web address] Password [email or web address]  06/15/2019, 3:24 PM

## 2019-06-15 NOTE — Progress Notes (Signed)
Called for report. ER RN will call back.

## 2019-06-15 NOTE — ED Triage Notes (Signed)
Pt sent here by MD at [facility] for acute on chronic kidney failure.  She was going to her apt to discuss placement of graft when her she was told to come here.

## 2019-06-15 NOTE — Consult Note (Addendum)
[facility]phrology Consultation Note  Requesting MD: Dr Doristine Bosworth, [place] Reason for consult: CKD  HPI:  Kathy Phelps is a 38 y.o. female with history of hypertension, bipolar mood disorder, uncontrolled diabetes with retinopathy, neuropathy and diabetic foot status post bilateral BKA, HLD, tobacco abuse, consulted nephrology for the further evaluation and management of renal failure. Patient went to see her primary care doctor at [facility] about 2 days ago when the lab was consistent with worsening renal failure with GFR less than 10 (exact creatinine level unknown).  Patient came to [place] [facility] office today for the urgent evaluation of renal failure.  In the clinic, the patient was complaining of urinary symptoms associated with chronic vomiting and overall not feeling well.  She was sent to the ER for further evaluation. In the ER, blood pressure 144/87, O2 sat 100% on room air.  The labs showed serum creatinine level of 5.42, CO2 18, potassium 4.7, hemoglobin 7.6.  The creatinine level was 4.9 in 04/2019. She was seen by nephrology team in 2018 in the hospital  for AKI when she was admitted with diabetic foot infection and nephrogenic bladder. No follow up since then.   Patient said she has very minimal urine output, states only once a month.  She reports vomiting with greenish bile intermittently this is going on for some time.  She reports that the blood sugar usually runs around 5-600 and today she was surprised to see her BS better around 200.  Her mother and sister on dialysis. No use of NSAIDs, over-the-counter medication. She denied dysuria, urgency, frequency, nausea, chest pain, shortness of breath.  No dysgeusia, weakness, skin rash or small joint pain.  No recent throat infection.  Reports chronic back pain which he attributes to 'kidney pain'.   PMHx:   Past Medical History:  Diagnosis Date  . Anxiety   . Asthma   . Bipolar affective ([place])   .  Complete miscarriage   . Constipation   . 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 Depression   . Diabetes mellitus    Type II  . Dyspnea     " only at night and I stop breathing in my sleep too for about the last three weeks"  . GERD (gastroesophageal reflux disease)   . Headache    "migraraines"  . Hyperlipidemia   . Hypertension   . Osteomyelitis ([place])    left foot  . Pregnancy complication    HELP Syndrome  . Renal disorder   . Schizophrenia ([place])   . Stroke [facility])    " mild stroke", memory loss- approx 2017    Past Surgical History:  Procedure Laterality Date  . ABDOMINAL AORTOGRAM N/A 09/07/2017   Procedure: ABDOMINAL AORTOGRAM;  Surgeon: Waynetta Sandy, MD;  Location: [place] [facility];  Service: Cardiovascular;  Laterality: N/A;  . ABDOMINAL AORTOGRAM W/LOWER EXTREMITY N/A 02/03/2017   Procedure: Abdominal Aortogram w/Lower Extremity;  Surgeon: Waynetta Sandy, MD;  Location: [place] [facility];  Service: Cardiovascular;  Laterality: N/A;  . ABDOMINAL AORTOGRAM W/LOWER EXTREMITY N/A 06/27/2018   Procedure: ABDOMINAL AORTOGRAM W/LOWER EXTREMITY;  Surgeon: Serafina Mitchell, MD;  Location: [place] [facility];  Service: Cardiovascular;  Laterality: N/A;  . AMPUTATION Left 02/10/2017   Procedure: AMPUTATION TOES 3, 4 AND 5  LEFT FOOT;  Surgeon: Waynetta Sandy, MD;  Location: [place];  Service: Vascular;  Laterality: Left;  . AMPUTATION Left 12/21/2017  Procedure: LEFT FOOT 5TH RAY AMPUTATION;  Surgeon: Newt Minion, MD;  Location: [place];  Service: Orthopedics;  Laterality: Left;  . AMPUTATION Left 02/01/2018   Procedure: LEFT BELOW KNEE AMPUTATION;  Surgeon: Newt Minion, MD;  Location: [place];  Service: Orthopedics;  Laterality: Left;  . AMPUTATION Right 08/11/2018   Procedure: AMPUTATION BELOW KNEE;  Surgeon: Newt Minion, MD;  Location: [place];  Service: Orthopedics;  Laterality: Right;  . CESAREAN SECTION    . CHOLECYSTECTOMY     . LOWER EXTREMITY ANGIOGRAPHY Bilateral 09/07/2017   Procedure: Lower Extremity Angiography;  Surgeon: Waynetta Sandy, MD;  Location: [place] [facility];  Service: Cardiovascular;  Laterality: Bilateral;  . PERIPHERAL VASCULAR ATHERECTOMY  02/03/2017   Procedure: Peripheral Vascular Atherectomy;  Surgeon: Waynetta Sandy, MD;  Location: [place] [facility];  Service: Cardiovascular;;  . PERIPHERAL VASCULAR ATHERECTOMY Right 06/27/2018   Procedure: PERIPHERAL VASCULAR ATHERECTOMY;  Surgeon: Serafina Mitchell, MD;  Location: [place] [facility];  Service: Cardiovascular;  Laterality: Right;  superficial femoral  . PERIPHERAL VASCULAR BALLOON ANGIOPLASTY  02/03/2017   Procedure: Peripheral Vascular Balloon Angioplasty;  Surgeon: Waynetta Sandy, MD;  Location: [place] [facility];  Service: Cardiovascular;;  . PERIPHERAL VASCULAR INTERVENTION Left 09/07/2017   Procedure: PERIPHERAL VASCULAR INTERVENTION;  Surgeon: Waynetta Sandy, MD;  Location: [place] [facility];  Service: Cardiovascular;  Laterality: Left;  SFA/POPLITEAL  . STUMP REVISION Left 05/24/2018  . STUMP REVISION Left 05/24/2018   Procedure: REVISION LEFT BELOW KNEE AMPUTATION;  Surgeon: Newt Minion, MD;  Location: [place];  Service: Orthopedics;  Laterality: Left;  . TUBAL LIGATION      Family Hx:  Family History  Problem Relation Age of Onset  . Diabetes Mellitus II Mother   . Heart disease Mother   . Heart disease Father     Social History:  reports that she has been smoking cigarettes. She has a 28.00 pack-year smoking history. She has never used smokeless tobacco. She reports that she does not drink alcohol or use drugs.  Allergies:  Allergies  Allergen Reactions  . Hydrocodone-Acetaminophen Hives  . Tylenol [Acetaminophen] Itching and Swelling    Patient tolerated APAP 650 mg (06/29/18) as well as oxycodone/APAP 5-325 mg during 06/25/2018 admission ??    Medications: Prior to  Admission medications   Medication Sig Start Date End Date Taking? Authorizing Provider  ALPRAZolam Duanne Moron) 1 MG tablet Take 1 mg by mouth 3 (three) times daily as needed for anxiety.  06/07/19  Yes [provider]  amLODipine (NORVASC) 5 MG tablet Take 5 mg by mouth daily. 06/13/19  Yes [provider]  gabapentin (NEURONTIN) 800 MG tablet Take 1 tablet (800 mg total) by mouth 2 (two) times daily. Patient taking differently: Take 800 mg by mouth 4 (four) times daily.  02/11/17  Yes Hosie Poisson, MD  LATUDA 20 MG TABS tablet Take 20 mg by mouth daily.  06/07/19  Yes [provider]  oxyCODONE (OXY IR/ROXICODONE) 5 MG immediate release tablet Take 5 mg by mouth 4 (four) times daily. 06/13/19  Yes [provider]  ADVAIR DISKUS 100-50 MCG/DOSE AEPB Inhale 1 puff into the lungs daily. 01/10/15   [provider]  alprazolam Duanne Moron) 2 MG tablet Take 1 tablet (2 mg total) by mouth 4 (four) times daily as needed for sleep. Patient not taking: Reported on 06/15/2019 08/12/17   Debbe Odea, MD  aspirin EC 81 MG tablet Take 1 tablet (  81 mg total) by mouth daily. Patient not taking: Reported on 06/15/2019 07/03/18 07/03/19  [name], MD  atorvastatin (LIPITOR) 80 MG tablet Take 1 tablet (80 mg total) by mouth daily at 6 PM. Patient not taking: Reported on 06/15/2019 07/03/18   [name], MD  cloNIDine (CATAPRES) 0.1 MG tablet Take 0.1 mg by mouth 2 (two) times daily. 05/12/18   [provider]  DULoxetine (CYMBALTA) 30 MG capsule Take 90 mg by mouth every morning.  06/09/18   [provider]  insulin glargine (LANTUS) 100 UNIT/ML injection Inject 0.15 mLs (15 Units total) into the skin at bedtime. 20 U in the morning and 30 U in the evening Patient not taking: Reported on 06/15/2019 08/15/18   Bonnielee Haff, MD  Insulin Syringe-Needle U-100 (INSULIN SYRINGE .5CC/31GX5/16") 31G X 5/16" 0.5 ML MISC 1 each by Does not apply route QID. 08/12/17   Gherghe,  Vella Redhead, MD  mirtazapine (REMERON) 15 MG tablet Take 15 mg by mouth at bedtime. 12/22/17   [provider]  NOVOLOG 100 UNIT/ML injection Inject 0-30 Units into the skin 3 (three) times daily with meals. Sliding Scale:  >300 15 units < 300 do not use Patient not taking: Reported on 06/15/2019 08/12/17   Caren Griffins, MD  pantoprazole (PROTONIX) 40 MG tablet Take 1 tablet (40 mg total) by mouth daily. Patient not taking: Reported on 06/15/2019 02/11/17   Hosie Poisson, MD  pentoxifylline (TRENTAL) 400 MG CR tablet Take 1 tablet (400 mg total) by mouth 3 (three) times daily with meals. Patient not taking: Reported on 06/15/2019 01/16/18   Newt Minion, MD  PROAIR HFA 108 [phone] BASE) MCG/ACT inhaler Inhale 2 puffs into the lungs every 6 (six) hours as needed for shortness of breath.  01/10/15   [provider]    I have reviewed the patient's current medications.  Labs:  Results for orders placed or performed during the hospital encounter of 06/15/19 (from the past 48 hour(s))  Lipase, blood     Status: None   Collection Time: 06/15/19 11:58 AM  Result Value Ref Range   Lipase 30 11 - 51 U/L    Comment: Performed at [facility], [place] [address], [place], [place] [address]  Comprehensive metabolic panel     Status: Abnormal   Collection Time: 06/15/19 11:58 AM  Result Value Ref Range   Sodium 139 135 - 145 mmol/L   Potassium 4.7 3.5 - 5.1 mmol/L   Chloride 111 98 - 111 mmol/L   CO2 18 (L) 22 - 32 mmol/L   Glucose, Bld 201 (H) 70 - 99 mg/dL   BUN 44 (H) 6 - 20 mg/dL   Creatinine, Ser 5.42 (H) 0.44 - 1.00 mg/dL   Calcium 7.8 (L) 8.9 - 10.3 mg/dL   Total Protein 6.2 (L) 6.5 - 8.1 g/dL   Albumin 2.6 (L) 3.5 - 5.0 g/dL   AST 8 (L) 15 - 41 U/L   ALT 5 0 - 44 U/L   Alkaline Phosphatase 70 38 - 126 U/L   Total Bilirubin 0.3 0.3 - 1.2 mg/dL   GFR calc non Af Amer 9 (L) >60 mL/min   GFR calc Af Amer 11 (L) >60 mL/min   Anion gap 10 5 - 15    Comment: Performed at [facility], [place] [address] [place], [place] [address]  CBC     Status: Abnormal   Collection Time: 06/15/19 11:58 AM  Result Value Ref  Range   WBC 10.6 (H) 4.0 - 10.5 K/uL   RBC 2.46 (L) 3.87 - 5.11 MIL/uL   Hemoglobin 7.5 (L) 12.0 - 15.0 g/dL   HCT 23.6 (L) 36.0 - 46.0 %   MCV 95.9 80.0 - 100.0 fL   MCH 30.5 26.0 - 34.0 pg   MCHC 31.8 30.0 - 36.0 g/dL   RDW 13.2 11.5 - 15.5 %   Platelets 261 150 - 400 K/uL   nRBC 0.0 0.0 - 0.2 %    Comment: Performed at [facility], [place] [address], [place], [place] [address]  Differential     Status: Abnormal   Collection Time: 06/15/19 11:58 AM  Result Value Ref Range   Neutrophils Relative % 70 %   Neutro Abs 7.5 1.7 - 7.7 K/uL   Lymphocytes Relative 19 %   Lymphs Abs 2.0 0.7 - 4.0 K/uL   Monocytes Relative 6 %   Monocytes Absolute 0.6 0.1 - 1.0 K/uL   Eosinophils Relative 4 %   Eosinophils Absolute 0.4 0.0 - 0.5 K/uL   Basophils Relative 0 %   Basophils Absolute 0.0 0.0 - 0.1 K/uL   Immature Granulocytes 1 %   Abs Immature Granulocytes 0.09 (H) 0.00 - 0.07 K/uL    Comment: Performed at [place] [address], [place], [place] [address]  CBC     Status: Abnormal   Collection Time: 06/15/19 11:58 AM  Result Value Ref Range   WBC 10.6 (H) 4.0 - 10.5 K/uL   RBC 2.43 (L) 3.87 - 5.11 MIL/uL   Hemoglobin 7.6 (L) 12.0 - 15.0 g/dL   HCT 23.3 (L) 36.0 - 46.0 %   MCV 95.9 80.0 - 100.0 fL   MCH 31.3 26.0 - 34.0 pg   MCHC 32.6 30.0 - 36.0 g/dL   RDW 13.2 11.5 - 15.5 %   Platelets 260 150 - 400 K/uL   nRBC 0.0 0.0 - 0.2 %    Comment: Performed at [facility], [place] [address] [place], [place] [address]  I-Stat beta hCG blood, ED     Status: None   Collection Time: 06/15/19 12:21 PM  Result Value Ref Range   I-stat hCG, quantitative <5.0 <5 mIU/mL   Comment 3            Comment:   GEST. AGE      CONC.  (mIU/mL)   <=1 WEEK        5 - 50     2 WEEKS       50 - 500     3 WEEKS       100 - 10,000     4 WEEKS     1,000 -  30,000        FEMALE AND NON-PREGNANT FEMALE:     LESS THAN 5 mIU/mL   SARS Coronavirus 2 [facility] order, Performed in [facility] hospital lab) Nasopharyngeal Nasopharyngeal Swab     Status: None   Collection Time: 06/15/19  1:12 PM   Specimen: Nasopharyngeal Swab  Result Value Ref Range   SARS Coronavirus 2 NEGATIVE NEGATIVE    Comment: (NOTE) If result is NEGATIVE SARS-CoV-2 target nucleic acids are NOT DETECTED. The SARS-CoV-2 RNA is generally detectable in upper and lower  respiratory specimens during the acute phase of infection. The lowest  concentration of SARS-CoV-2 viral copies this assay can detect is 250  copies / mL. A negative result does not preclude SARS-CoV-2 infection  and  should not be used as the sole basis for treatment or other  patient management decisions.  A negative result may occur with  improper specimen collection / handling, submission of specimen other  than nasopharyngeal swab, presence of viral mutation(s) within the  areas targeted by this assay, and inadequate number of viral copies  (<250 copies / mL). A negative result must be combined with clinical  observations, patient history, and epidemiological information. If result is POSITIVE SARS-CoV-2 target nucleic acids are DETECTED. The SARS-CoV-2 RNA is generally detectable in upper and lower  respiratory specimens dur ing the acute phase of infection.  Positive  results are indicative of active infection with SARS-CoV-2.  Clinical  correlation with patient history and other diagnostic information is  necessary to determine patient infection status.  Positive results do  not rule out bacterial infection or co-infection with other viruses. If result is PRESUMPTIVE POSTIVE SARS-CoV-2 nucleic acids MAY BE PRESENT.   A presumptive positive result was obtained on the submitted specimen  and confirmed on repeat testing.  While 2019 novel coronavirus  (SARS-CoV-2) nucleic acids may be present in the  submitted sample  additional confirmatory testing may be necessary for epidemiological  and / or clinical management purposes  to differentiate between  SARS-CoV-2 and other Sarbecovirus currently known to infect humans.  If clinically indicated additional testing with an alternate test  methodology [id number]) is advised. The SARS-CoV-2 RNA is generally  detectable in upper and lower respiratory sp ecimens during the acute  phase of infection. The expected result is Negative. Fact Sheet for Patients:  StrictlyIdeas.no Fact Sheet for Healthcare Providers: BankingDealers.co.za This test is not yet approved or cleared by the [place] FDA and has been authorized for detection and/or diagnosis of SARS-CoV-2 by FDA under an Emergency Use Authorization (EUA).  This EUA will remain in effect (meaning this test can be used) for the duration of the COVID-19 declaration under Section 564(b)(1) of the Act, 21 U.S.C. section 360bbb-3(b)(1), unless the authorization is terminated or revoked sooner. Performed at [facility], [place] [address], [place], [place] [address]   Hemoglobin A1c     Status: Abnormal   Collection Time: 06/15/19  3:21 PM  Result Value Ref Range   Hgb A1c MFr Bld 6.4 (H) 4.8 - 5.6 %    Comment: (NOTE) Pre diabetes:          5.7%-6.4% Diabetes:              >6.4% Glycemic control for   <7.0% adults with diabetes    Mean Plasma Glucose 136.98 mg/dL    Comment: Performed at [facility] [address], [place],  [address]     ROS:  Pertinent items noted in HPI and remainder of comprehensive ROS otherwise negative.  Physical Exam: Vitals:   06/15/19 1545 06/15/19 1600  BP: (!) 159/85 (!) 148/97  Pulse:  88  Resp:  18  Temp:    SpO2:  99%     General exam: Appears calm and comfortable  Respiratory system: Clear to auscultation. Respiratory effort normal. No wheezing or crackle Cardiovascular  system: S1 & S2 heard, RRR.   Gastrointestinal system: Abdomen is nondistended, soft and nontender. Normal bowel sounds heard. Central nervous system: Alert and oriented. No focal neurological deficits. Extremities: Bilateral BKA, no edema. Skin: No rashes, lesions or ulcers Psychiatry: Judgement and insight appear normal. Mood & affect appropriate, no asterixis.   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  The creatinine was 4.9 in 04/2019.  The bladder scan done in ER with 85 cc of urine.  Potassium level is acceptable and patient has no overt uremic symptoms.  She reports chronic vomiting which could be explained by possible diabetic gastroparesis.  She is on room air and euvolemic on exam.  No urgent indication for dialysis today. However, I have explained to her that if no improvement in  renal function then she may need dialysis. She agreed with the plan. -Check urinalysis, urine electrolytes, urine protein creatinine ratio, Kidney US. -Insert Foley catheter for a strict ins and out as patient says decreasing urine output. -Start gentle IV hydration. -Avoid IV contrast, hypotension. -Check CKD lab including PTH, phosphorus and iron.   #Metabolic acidosis: Start sodium bicarb and IV fluid.  #Anemia due to CKD: Check iron studies.  Monitor CBC.  #Hypertension: Resume amlodipine and clonidine.  Monitor blood pressure.  #Uncontrolled diabetes with retinopathy, neuropathy and vascular disease: Follow-up A1c.  On insulin per primary team.  Thank you for the consult. We'll follow.   Alisse Tuite Tanna Furry 06/15/2019, 4:32 PM  [facility].

## 2019-06-15 NOTE — ED Provider Notes (Signed)
[facility] EMERGENCY DEPARTMENT Provider Note   CSN: [id number] Arrival date & time: 06/15/19  1143     History   Chief Complaint Chief Complaint  Patient presents with  . Acute Renal Failure    on chronic    HPI Kathy Phelps is a 38 y.o. female presenting for evaluation of kidney failure.  Patient states the past 4 months, she is only been urinating once a month.  She urinated yesterday, urine was very dark, foul-smelling, and burning.  She reports 2 months of chronic low back pain which radiates up to her kidneys.  She reports daily vomiting which occurs after eating.  She saw her PCP with [facility] 2 days ago, and was referred to nephrology.  She was seen by [facility] in the office today to discuss graft placement, but it was felt that she would benefit from admission to the hospital and possible HD port placement until graft can be arranged.  Patient reports a history of poorly controlled diabetes, states blood sugars are normally 600-800.  She denies fevers, chills, cough, abdominal pain.  She states she has not been having normal bowel movements, as she is not eating very much.  Additional history obtained from chart review.  Patient was hospitalized in April 2020, was found that she had an AKI.  It was recommended that she stay, but she left AMA prior to complete evaluation of her kidneys and development of a plan.  As of November 2019, patient's baseline creatinine was 1.7. Additional medical history of anxiety, bipolar, diabetes, bilateral BKA, GERD, hypertension, hyperlipidemia, schizophrenia     HPI  Past Medical History:  Diagnosis Date  . Anxiety   . Asthma   . Bipolar affective ([place])   . Complete miscarriage   . Constipation   . 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 Depression   . Diabetes mellitus    Type II  . Dyspnea     " only at night and I stop breathing in my sleep too for about the last three  weeks"  . GERD (gastroesophageal reflux disease)   . Headache    "migraraines"  . Hyperlipidemia   . Hypertension   . Osteomyelitis ([place])    left foot  . Pregnancy complication    HELP Syndrome  . Renal disorder   . Schizophrenia ([place])   . Stroke [facility])    " mild stroke", memory loss- approx 2017    Patient Active Problem List   Diagnosis Date Noted  . 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 Acute on chronic renal failure ([place]) 02/07/2019  . Anxiety and depression   . 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[place]) 08/11/2018  . CKD stage 3 secondary to diabetes ([place]) 08/11/2018  . Uncontrolled diabetes mellitus type 2 with peripheral artery disease ([place]) 08/11/2018  . Gangrene of right foot ([place])   . Lower limb ischemia 06/25/2018  . Lymphangitis 06/25/2018  . Menorrhagia 06/25/2018  . Hx of BKA, left ([place]) 05/24/2018  . History of left below knee amputation ([place]) 02/01/2018  . Gangrene of left foot ([place])   . Dehiscence of amputation stump ([place]) 01/26/2018  . Surgical wound, non healing 09/02/2017  . Bipolar 1 disorder ([place]) 09/02/2017  . Nausea vomiting and diarrhea 08/11/2017  . Great toe pain, left 08/11/2017  . GERD (gastroesophageal reflux disease) 08/10/2017  . Anxiety 08/10/2017  . Edema   . Cellulitis   .  AKI (acute kidney injury) ([place]) 02/04/2017  . Type II diabetes mellitus with renal manifestations, uncontrolled ([place]) 02/02/2017  . HLD (hyperlipidemia) 02/02/2017  . Tobacco abuse 02/02/2017  . Essential hypertension 02/02/2017  . Malnutrition of moderate degree 02/02/2017    Past Surgical History:  Procedure Laterality Date  . ABDOMINAL AORTOGRAM N/A 09/07/2017   Procedure: ABDOMINAL AORTOGRAM;  Surgeon: Waynetta Sandy, MD;  Location: [facility];  Service: Cardiovascular;  Laterality: N/A;  . ABDOMINAL AORTOGRAM W/LOWER EXTREMITY N/A 02/03/2017   Procedure: Abdominal Aortogram w/Lower Extremity;  Surgeon: Waynetta Sandy, MD;   Location: [place] [facility];  Service: Cardiovascular;  Laterality: N/A;  . ABDOMINAL AORTOGRAM W/LOWER EXTREMITY N/A 06/27/2018   Procedure: ABDOMINAL AORTOGRAM W/LOWER EXTREMITY;  Surgeon: Serafina Mitchell, MD;  Location: [facility];  Service: Cardiovascular;  Laterality: N/A;  . AMPUTATION Left 02/10/2017   Procedure: AMPUTATION TOES 3, 4 AND 5  LEFT FOOT;  Surgeon: Waynetta Sandy, MD;  Location: [facility];  Service: Vascular;  Laterality: Left;  . AMPUTATION Left 12/21/2017   Procedure: LEFT FOOT 5TH RAY AMPUTATION;  Surgeon: Newt Minion, MD;  Location: [facility];  Service: Orthopedics;  Laterality: Left;  . AMPUTATION Left 02/01/2018   Procedure: LEFT BELOW KNEE AMPUTATION;  Surgeon: Newt Minion, MD;  Location: [facility];  Service: Orthopedics;  Laterality: Left;  . AMPUTATION Right 08/11/2018   Procedure: AMPUTATION BELOW KNEE;  Surgeon: Newt Minion, MD;  Location: [facility];  Service: Orthopedics;  Laterality: Right;  . CESAREAN SECTION    . CHOLECYSTECTOMY    . LOWER EXTREMITY ANGIOGRAPHY Bilateral 09/07/2017   Procedure: Lower Extremity Angiography;  Surgeon: Waynetta Sandy, MD;  Location: [facility];  Service: Cardiovascular;  Laterality: Bilateral;  . PERIPHERAL VASCULAR ATHERECTOMY  02/03/2017   Procedure: Peripheral Vascular Atherectomy;  Surgeon: Waynetta Sandy, MD;  Location: [place] [facility];  Service: Cardiovascular;;  . PERIPHERAL VASCULAR ATHERECTOMY Right 06/27/2018   Procedure: PERIPHERAL VASCULAR ATHERECTOMY;  Surgeon: Serafina Mitchell, MD;  Location: [facility];  Service: Cardiovascular;  Laterality: Right;  superficial femoral  . PERIPHERAL VASCULAR BALLOON ANGIOPLASTY  02/03/2017   Procedure: Peripheral Vascular Balloon Angioplasty;  Surgeon: Waynetta Sandy, MD;  Location: [place] [facility];  Service: Cardiovascular;;  . PERIPHERAL VASCULAR INTERVENTION Left 09/07/2017   Procedure: PERIPHERAL VASCULAR INTERVENTION;   Surgeon: Waynetta Sandy, MD;  Location: [facility];  Service: Cardiovascular;  Laterality: Left;  SFA/POPLITEAL  . STUMP REVISION Left 05/24/2018  . STUMP REVISION Left 05/24/2018   Procedure: REVISION LEFT BELOW KNEE AMPUTATION;  Surgeon: Newt Minion, MD;  Location: [facility];  Service: Orthopedics;  Laterality: Left;  . TUBAL LIGATION       OB History   No obstetric history on file.      Home Medications    Prior to Admission medications   Medication Sig Start Date End Date Taking? Authorizing Provider  ALPRAZolam Duanne Moron) 1 MG tablet Take 1 mg by mouth 3 (three) times daily as needed for anxiety.  06/07/19  Yes [provider]  amLODipine (NORVASC) 5 MG tablet Take 5 mg by mouth daily. 06/13/19  Yes [provider]  gabapentin (NEURONTIN) 800 MG tablet Take 1 tablet (800 mg total) by mouth 2 (two) times daily. Patient taking differently: Take 800 mg by mouth 4 (four) times daily.  02/11/17  Yes Hosie Poisson, MD  LATUDA 20 MG TABS tablet Take 20  mg by mouth daily.  06/07/19  Yes [provider]  oxyCODONE (OXY IR/ROXICODONE) 5 MG immediate release tablet Take 5 mg by mouth 4 (four) times daily. 06/13/19  Yes [provider]  ADVAIR DISKUS 100-50 MCG/DOSE AEPB Inhale 1 puff into the lungs daily. 01/10/15   [provider]  alprazolam Duanne Moron) 2 MG tablet Take 1 tablet (2 mg total) by mouth 4 (four) times daily as needed for sleep. Patient not taking: Reported on 06/15/2019 08/12/17   Debbe Odea, MD  aspirin EC 81 MG tablet Take 1 tablet (81 mg total) by mouth daily. Patient not taking: Reported on 06/15/2019 07/03/18 07/03/19  [name], MD  atorvastatin (LIPITOR) 80 MG tablet Take 1 tablet (80 mg total) by mouth daily at 6 PM. Patient not taking: Reported on 06/15/2019 07/03/18   [name], MD  cloNIDine (CATAPRES) 0.1 MG tablet Take 0.1 mg by mouth 2 (two) times daily. 05/12/18   [provider]  DULoxetine  (CYMBALTA) 30 MG capsule Take 90 mg by mouth every morning.  06/09/18   [provider]  insulin glargine (LANTUS) 100 UNIT/ML injection Inject 0.15 mLs (15 Units total) into the skin at bedtime. 20 U in the morning and 30 U in the evening Patient not taking: Reported on 06/15/2019 08/15/18   Bonnielee Haff, MD  Insulin Syringe-Needle U-100 (INSULIN SYRINGE .5CC/31GX5/16") 31G X 5/16" 0.5 ML MISC 1 each by Does not apply route QID. 08/12/17   Gherghe, Vella Redhead, MD  mirtazapine (REMERON) 15 MG tablet Take 15 mg by mouth at bedtime. 12/22/17   [provider]  NOVOLOG 100 UNIT/ML injection Inject 0-30 Units into the skin 3 (three) times daily with meals. Sliding Scale:  >300 15 units < 300 do not use Patient not taking: Reported on 06/15/2019 08/12/17   Caren Griffins, MD  pantoprazole (PROTONIX) 40 MG tablet Take 1 tablet (40 mg total) by mouth daily. Patient not taking: Reported on 06/15/2019 02/11/17   Hosie Poisson, MD  pentoxifylline (TRENTAL) 400 MG CR tablet Take 1 tablet (400 mg total) by mouth 3 (three) times daily with meals. Patient not taking: Reported on 06/15/2019 01/16/18   Newt Minion, MD  PROAIR HFA 108 [phone] BASE) MCG/ACT inhaler Inhale 2 puffs into the lungs every 6 (six) hours as needed for shortness of breath.  01/10/15   [provider]    Family History Family History  Problem Relation Age of Onset  . Diabetes Mellitus II Mother   . Heart disease Mother   . Heart disease Father     Social History Social History   Tobacco Use  . Smoking status: Current Every Day Smoker    Packs/day: 1.00    Years: 28.00    Pack years: 28.00    Types: Cigarettes  . Smokeless tobacco: Never Used  Substance Use Topics  . Alcohol use: No  . Drug use: No     Allergies   Hydrocodone-acetaminophen and Tylenol [acetaminophen]   Review of Systems Review of Systems  Gastrointestinal: Positive for nausea and vomiting.  Genitourinary: Positive for decreased urine  volume, dysuria and flank pain.  All other systems reviewed and are negative.   Physical Exam Updated Vital Signs BP (!) 148/97 (BP Location: Right Arm)   Pulse 88   Temp 98.4 F (36.9 C) (Oral)   Resp 18   Ht 5\' 9"  (1.753 m)   Wt 72 kg   LMP 06/10/2019   SpO2 99%   BMI 23.44  kg/m   Physical Exam Vitals signs and nursing note reviewed.  Constitutional:      Appearance: She is well-developed. She is ill-appearing.     Comments: Appears older than stated age.  Sallow appearing  HENT:     Head: Normocephalic and atraumatic.  Eyes:     Conjunctiva/sclera: Conjunctivae normal.     Pupils: Pupils are equal, round, and reactive to light.  Neck:     Musculoskeletal: Normal range of motion and neck supple.  Cardiovascular:     Rate and Rhythm: Normal rate and regular rhythm.     Pulses: Normal pulses.  Pulmonary:     Effort: Pulmonary effort is normal. No respiratory distress.     Breath sounds: Normal breath sounds. No wheezing.  Abdominal:     General: There is no distension.     Palpations: Abdomen is soft. There is no mass.     Tenderness: There is no abdominal tenderness. There is right CVA tenderness and left CVA tenderness. There is no guarding or rebound.     Comments: Bilateral CVA and low back pain.  Musculoskeletal: Normal range of motion.     Comments: Bilateral BKA's.  No pitting edema.  Skin:    General: Skin is warm and dry.     Capillary Refill: Capillary refill takes less than 2 seconds.  Neurological:     Mental Status: She is alert and oriented to person, place, and time.      ED Treatments / Results  Labs (all labs ordered are listed, but only abnormal results are displayed) Labs Reviewed  COMPREHENSIVE METABOLIC PANEL - Abnormal; Notable for the following components:      Result Value   CO2 18 (*)    Glucose, Bld 201 (*)    BUN 44 (*)    Creatinine, Ser 5.42 (*)    Calcium 7.8 (*)    Total Protein 6.2 (*)    Albumin 2.6 (*)    AST 8 (*)     GFR calc non Af Amer 9 (*)    GFR calc Af Amer 11 (*)    All other components within normal limits  CBC - Abnormal; Notable for the following components:   WBC 10.6 (*)    RBC 2.46 (*)    Hemoglobin 7.5 (*)    HCT 23.6 (*)    All other components within normal limits  DIFFERENTIAL - Abnormal; Notable for the following components:   Abs Immature Granulocytes 0.09 (*)    All other components within normal limits  CBC - Abnormal; Notable for the following components:   WBC 10.6 (*)    RBC 2.43 (*)    Hemoglobin 7.6 (*)    HCT 23.3 (*)    All other components within normal limits  HEMOGLOBIN A1C - Abnormal; Notable for the following components:   Hgb A1c MFr Bld 6.4 (*)    All other components within normal limits  SARS CORONAVIRUS 2 (HOSPITAL ORDER, [facility])  URINE CULTURE  LIPASE, BLOOD  URINALYSIS, ROUTINE W REFLEX MICROSCOPIC  MAGNESIUM  LIPID PANEL  IRON AND TIBC  FERRITIN  URINALYSIS, ROUTINE W REFLEX MICROSCOPIC  PROTEIN / CREATININE RATIO, URINE  NA AND K (SODIUM & POTASSIUM), RAND UR  I-STAT BETA HCG BLOOD, ED ([facility], [facility], [facility] ONLY)    EKG None  Radiology Ct Abdomen Pelvis Wo Contrast  Result Date: 06/15/2019 CLINICAL DATA:  Lower abdominal pain for 5 days. EXAM: CT ABDOMEN AND PELVIS WITHOUT  CONTRAST TECHNIQUE: Multidetector CT imaging of the abdomen and pelvis was performed following the standard protocol without IV contrast. COMPARISON:  CT abdomen and pelvis 02/08/2019. FINDINGS: Lower chest: Mild dependent atelectasis. No pleural or pericardial effusion. Hepatobiliary: No focal liver abnormality is seen. Status post cholecystectomy. No biliary dilatation. Pancreas: Unremarkable. No pancreatic ductal dilatation or surrounding inflammatory changes. Spleen: Normal in size without focal abnormality. Adrenals/Urinary Tract: Adrenal glands are unremarkable. Kidneys are normal, without renal calculi, focal lesion, or hydronephrosis. Bladder is  unremarkable. Stomach/Bowel: Stomach is within normal limits. Appendix appears normal. No evidence of bowel wall thickening, distention, or inflammatory changes. Vascular/Lymphatic: Aortic atherosclerosis. No enlarged abdominal or pelvic lymph nodes. Reproductive: Uterus and bilateral adnexa are unremarkable. Other: None. Musculoskeletal: No acute or focal abnormality. IMPRESSION: No acute abnormality or finding to explain the patient's symptoms. Negative for urinary tract stone. Atherosclerosis. Electronically Signed   By: Inge Rise M.D.   On: 06/15/2019 13:57    Procedures .Critical Care Performed by: Franchot Heidelberg, PA-C Authorized by: Franchot Heidelberg, PA-C   Critical care provider statement:    Critical care time (minutes):  45   Critical care time was exclusive of:  Separately billable procedures and treating other patients and teaching time   Critical care was necessary to treat or prevent imminent or life-threatening deterioration of the following conditions:  Renal failure   Critical care was time spent personally by me on the following activities:  Blood draw for specimens, development of treatment plan with patient or surrogate, discussions with consultants, evaluation of patient's response to treatment, examination of patient, obtaining history from patient or surrogate, ordering and performing treatments and interventions, ordering and review of laboratory studies, ordering and review of radiographic studies, pulse oximetry, re-evaluation of patient's condition and review of old charts   I assumed direction of critical care for this patient from another provider in my specialty: no   Comments:     Chronically ill-appearing, sallow appearance.  Acute on chronic kidney failure requiring admission to the hospital and dialysis.   (including critical care time)  Medications Ordered in ED Medications  nicotine (NICODERM CQ - dosed in mg/24 hours) patch 21 mg (21 mg Transdermal  Patch Applied 06/15/19 1617)  ondansetron (ZOFRAN) tablet 4 mg (has no administration in time range)    Or  ondansetron (ZOFRAN) injection 4 mg (has no administration in time range)  insulin aspart (novoLOG) injection 0-15 Units (has no administration in time range)  insulin aspart (novoLOG) injection 0-5 Units (has no administration in time range)  sodium bicarbonate 150 mEq in sterile water 1,000 mL infusion (has no administration in time range)  sodium chloride flush (NS) 0.9 % injection 3 mL (3 mLs Intravenous Given 06/15/19 1517)  oxyCODONE-acetaminophen (PERCOCET/ROXICET) 5-325 MG per tablet 1 tablet (1 tablet Oral Given 06/15/19 1319)  fentaNYL (SUBLIMAZE) injection 25 mcg (25 mcg Intravenous Given 06/15/19 1518)     Initial Impression / Assessment and Plan / ED Course  I have reviewed the triage vital signs and the nursing notes.  Pertinent labs & imaging results that were available during my care of the patient were reviewed by me and considered in my medical decision making (see chart for details).        Patient presenting for evaluation of kidney failure.  Sent from the nephrologist.  Physical exam shows chronically ill, sallow appearing patient, however she is not acutely toxic and imminent threat of death.  No obvious fluid overload.  As such, doubt veracity of  urination x1/month for the past several months.  However, I do believe she has had decreased urination, and consider UTI due to urinary symptoms yesterday.  Will obtain labs.  Bladder scan to assess for retention versus lack of urine production.  CT for further evaluation.  Labs show acute on chronic kidney failure with a creatinine of 5.4.  Mild leukocytosis at 10.  No signs of sepsis or systemic infection.  Bladder scan 85.  Will call nephrology for consult.  Discussed with Dr. Carolin Sicks from nephrology who recommends admission to the hospitalist service, they will consult for dialysis needs.  CT without acute findings.   Discussed with Dr. Doristine Bosworth from [facility]ad hospitalist service, patient be admitted.  Patient states she does not want be admitted because she would not be allowed to smoke a cigarette.  I discussed with patient option of nicotine patch, and my concern that if she leaves AMA she will have worsening kidney failure and die.  Patient states she understands, is agreeable to stay for a short period and will try nicotine patch.   Final Clinical Impressions(s) / ED Diagnoses   Final diagnoses:  Acute renal failure superimposed on stage 5 chronic kidney disease, not on chronic dialysis, unspecified acute renal failure type [facility])    ED Discharge Orders    None       Franchot Heidelberg, PA-C 06/15/19 1638    Carmin Muskrat, MD 06/16/19 [phone]

## 2019-06-15 NOTE — ED Notes (Signed)
Pt possibly anuric. Pt asked prev RN to cath her, although she had 85 ml per bladder scanner. Pt reports her kidneys hurt and that she needs pain medicine for them.

## 2019-06-15 NOTE — Progress Notes (Signed)
New Admission Note:  Arrival Method: Stretcher Mental Orientation: Alert and oriented x 4 Telemetry: N/A Assessment: Completed Skin: Warm and dry IV: NSL Pain: 10/10 back pain  Tubes: N/A Safety Measures: Safety Fall Prevention Plan initiated.  Admission: Completed 5 M  Orientation: Patient has been orientated to the room, unit and the staff. Welcome booklet given.  Family: N/A  Orders have been reviewed and implemented. Will continue to monitor the patient. Call light has been placed within reach and bed alarm has been activated.   Sima Matas BSN, RN  Phone Number: [phone]

## 2019-06-15 NOTE — ED Notes (Signed)
Patient transported to CT 

## 2019-06-16 DIAGNOSIS — E785 Hyperlipidemia, unspecified: Secondary | ICD-10-CM | POA: Diagnosis present

## 2019-06-16 DIAGNOSIS — F17210 Nicotine dependence, cigarettes, uncomplicated: Secondary | ICD-10-CM | POA: Diagnosis present

## 2019-06-16 DIAGNOSIS — N185 Chronic kidney disease, stage 5: Secondary | ICD-10-CM

## 2019-06-16 DIAGNOSIS — Z833 Family history of diabetes mellitus: Secondary | ICD-10-CM | POA: Diagnosis not present

## 2019-06-16 DIAGNOSIS — Z885 Allergy status to narcotic agent status: Secondary | ICD-10-CM | POA: Diagnosis not present

## 2019-06-16 DIAGNOSIS — E11319 Type 2 diabetes mellitus with unspecified diabetic retinopathy without macular edema: Secondary | ICD-10-CM | POA: Diagnosis present

## 2019-06-16 DIAGNOSIS — Z20828 Contact with and (suspected) exposure to other viral communicable diseases: Secondary | ICD-10-CM | POA: Diagnosis present

## 2019-06-16 DIAGNOSIS — K219 Gastro-esophageal reflux disease without esophagitis: Secondary | ICD-10-CM | POA: Diagnosis present

## 2019-06-16 DIAGNOSIS — I120 Hypertensive chronic kidney disease with stage 5 chronic kidney disease or end stage renal disease: Secondary | ICD-10-CM | POA: Diagnosis present

## 2019-06-16 DIAGNOSIS — E872 Acidosis: Secondary | ICD-10-CM | POA: Diagnosis present

## 2019-06-16 DIAGNOSIS — Z886 Allergy status to analgesic agent status: Secondary | ICD-10-CM | POA: Diagnosis not present

## 2019-06-16 DIAGNOSIS — I69311 Memory deficit following cerebral infarction: Secondary | ICD-10-CM | POA: Diagnosis not present

## 2019-06-16 DIAGNOSIS — E1122 Type 2 diabetes mellitus with diabetic chronic kidney disease: Secondary | ICD-10-CM | POA: Diagnosis present

## 2019-06-16 DIAGNOSIS — Z89512 Acquired absence of left leg below knee: Secondary | ICD-10-CM | POA: Diagnosis not present

## 2019-06-16 DIAGNOSIS — N179 Acute kidney failure, unspecified: Principal | ICD-10-CM

## 2019-06-16 DIAGNOSIS — Z794 Long term (current) use of insulin: Secondary | ICD-10-CM | POA: Diagnosis not present

## 2019-06-16 DIAGNOSIS — D631 Anemia in chronic kidney disease: Secondary | ICD-10-CM | POA: Diagnosis present

## 2019-06-16 DIAGNOSIS — N390 Urinary tract infection, site not specified: Secondary | ICD-10-CM | POA: Diagnosis present

## 2019-06-16 DIAGNOSIS — Z89511 Acquired absence of right leg below knee: Secondary | ICD-10-CM | POA: Diagnosis not present

## 2019-06-16 DIAGNOSIS — J45909 Unspecified asthma, uncomplicated: Secondary | ICD-10-CM | POA: Diagnosis present

## 2019-06-16 DIAGNOSIS — F209 Schizophrenia, unspecified: Secondary | ICD-10-CM | POA: Diagnosis present

## 2019-06-16 DIAGNOSIS — F319 Bipolar disorder, unspecified: Secondary | ICD-10-CM | POA: Diagnosis present

## 2019-06-16 DIAGNOSIS — Z7982 Long term (current) use of aspirin: Secondary | ICD-10-CM | POA: Diagnosis not present

## 2019-06-16 DIAGNOSIS — Z8249 Family history of ischemic heart disease and other diseases of the circulatory system: Secondary | ICD-10-CM | POA: Diagnosis not present

## 2019-06-16 LAB — RENAL FUNCTION PANEL
Albumin: 2.1 g/dL — ABNORMAL LOW (ref 3.5–5.0)
Anion gap: 10 (ref 5–15)
BUN: 43 mg/dL — ABNORMAL HIGH (ref 6–20)
CO2: 22 mmol/L (ref 22–32)
Calcium: 7.5 mg/dL — ABNORMAL LOW (ref 8.9–10.3)
Chloride: 108 mmol/L (ref 98–111)
Creatinine, Ser: 5.13 mg/dL — ABNORMAL HIGH (ref 0.44–1.00)
GFR calc Af Amer: 11 mL/min — ABNORMAL LOW (ref >60)
GFR calc non Af Amer: 10 mL/min — ABNORMAL LOW (ref >60)
Glucose, Bld: 230 mg/dL — ABNORMAL HIGH (ref 70–99)
Phosphorus: 6.5 mg/dL — ABNORMAL HIGH (ref 2.5–4.6)
Potassium: 4.3 mmol/L (ref 3.5–5.1)
Sodium: 140 mmol/L (ref 135–145)

## 2019-06-16 LAB — FERRITIN: Ferritin: 37 ng/mL (ref 11–307)

## 2019-06-16 LAB — CBC
HCT: 20.1 % — ABNORMAL LOW (ref 36.0–46.0)
Hemoglobin: 6.4 g/dL — CL (ref 12.0–15.0)
MCH: 30.2 pg (ref 26.0–34.0)
MCHC: 31.8 g/dL (ref 30.0–36.0)
MCV: 94.8 fL (ref 80.0–100.0)
Platelets: 216 10*3/uL (ref 150–400)
RBC: 2.12 MIL/uL — ABNORMAL LOW (ref 3.87–5.11)
RDW: 13.3 % (ref 11.5–15.5)
WBC: 7.3 10*3/uL (ref 4.0–10.5)
nRBC: 0 % (ref 0.0–0.2)

## 2019-06-16 LAB — GLUCOSE, CAPILLARY
Glucose-Capillary: 192 mg/dL — ABNORMAL HIGH (ref 70–99)
Glucose-Capillary: 182 mg/dL — ABNORMAL HIGH (ref 70–99)
Glucose-Capillary: 216 mg/dL — ABNORMAL HIGH (ref 70–99)

## 2019-06-16 LAB — IRON AND TIBC
Iron: 37 ug/dL (ref 28–170)
Saturation Ratios: 16 % (ref 10.4–31.8)
TIBC: 234 ug/dL — ABNORMAL LOW (ref 250–450)
UIBC: 197 ug/dL

## 2019-06-16 LAB — PREPARE RBC (CROSSMATCH)

## 2019-06-16 MED ORDER — OXYCODONE HCL 5 MG PO TABS
5.0000 mg | ORAL_TABLET | Freq: Four times a day (QID) | ORAL | Status: DC | PRN
  Administered 2019-06-16 (×2): 5 mg via ORAL
  Filled 2019-06-16 (×2): qty 1

## 2019-06-16 MED ORDER — HEPARIN SODIUM (PORCINE) 5000 UNIT/ML IJ SOLN: 5000.0000 [IU] | Freq: Three times a day (TID) | INTRAMUSCULAR | Status: DC

## 2019-06-16 MED ORDER — STERILE WATER FOR INJECTION IV SOLN
INTRAVENOUS | Status: AC
  Filled 2019-06-16: qty 850

## 2019-06-16 MED ORDER — SODIUM BICARBONATE 650 MG PO TABS
1300.0000 mg | ORAL_TABLET | Freq: Two times a day (BID) | ORAL | Status: DC
  Administered 2019-06-16: 1300 mg via ORAL
  Filled 2019-06-16: qty 2

## 2019-06-16 MED ORDER — NICOTINE 14 MG/24HR TD PT24
14.0000 mg | MEDICATED_PATCH | Freq: Every day | TRANSDERMAL | Status: DC
  Administered 2019-06-16: 14 mg via TRANSDERMAL
  Filled 2019-06-16: qty 1

## 2019-06-16 MED ORDER — DARBEPOETIN ALFA 100 MCG/0.5ML IJ SOSY
100.0000 ug | PREFILLED_SYRINGE | Freq: Once | INTRAMUSCULAR | Status: AC
  Administered 2019-06-16: 100 ug via SUBCUTANEOUS
  Filled 2019-06-16 (×2): qty 0.5

## 2019-06-16 MED ORDER — OXYCODONE-ACETAMINOPHEN 5-325 MG PO TABS
1.0000 | ORAL_TABLET | Freq: Once | ORAL | Status: AC
  Administered 2019-06-16: 1 via ORAL
  Filled 2019-06-16: qty 1

## 2019-06-16 MED ORDER — SODIUM CHLORIDE 0.9 % IV SOLN
250.0000 mg | Freq: Every day | INTRAVENOUS | Status: DC
  Administered 2019-06-16: 250 mg via INTRAVENOUS
  Filled 2019-06-16: qty 20

## 2019-06-16 MED ORDER — SODIUM CHLORIDE 0.9% IV SOLUTION
Freq: Once | INTRAVENOUS | Status: AC
  Administered 2019-06-16: 11:00:00 via INTRAVENOUS

## 2019-06-16 NOTE — Progress Notes (Signed)
CRITICAL VALUE ALERT  Critical Value: Hgb: 6.4  Date & Time Notied: 06/16/19 0745  Provider Notified: Broadus John, MD   Orders Received/Actions taken: 1 unit of PRBC ordered.

## 2019-06-16 NOTE — Progress Notes (Signed)
[facility] NEPHROLOGY PROGRESS NOTE  Assessment/ Plan: Pt is a 38 y.o. 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  Serum creatinine level 5.42 in ER.  The prior creatinine level was 4.9 in 04/2019.  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  Urinalysis  with no RBC but consistent with UTI.  She is asymptomatic.  The urine PCR 0.94.  Kidney ultrasound: Unremarkable with normal echogenicity.  Urine output around 400 cc overnight.  Electrolytes are acceptable and she is euvolemic on exam.  No uremic features to warrant urgent dialysis at this time.  I will order vein mapping.  -Creatinine level mildly trended down to 5.1 today.  Continue Foley catheter for a strict ins and out. -Follow-up PTH level.  #Metabolic acidosis: Improved with IV sodium bicarbonate.  Starting fluid in few hours.  Will start oral sodium bicarb.  #Anemia due to CKD: Hemoglobin 6.4 today receiving blood transfusion.  Iron saturation 16%.  IV will order IV iron and Aranesp.  No sign of bleeding. Monitor CBC.  #Hypertension: Resume amlodipine and clonidine.  Monitor blood pressure.  #Uncontrolled diabetes with retinopathy, neuropathy and vascular disease: On insulin per primary team.  Subjective: Seen and examined at bedside.  Sitting on bed comfortably.  Asking for pain medication.  Denies nausea vomiting headache dizziness chest pain shortness of breath.  No dysgeusia.  Her daughter at bedside. Objective Vital signs in last 24 hours: Vitals:   06/16/19 0457 06/16/19 0457 06/16/19 1022 06/16/19 1043  BP: 137/89 137/89 136/83 136/83  Pulse: 81 80 75 75  Resp: 18 18  18   Temp: 97.7 F (36.5 C) 97.7 F (36.5 C) 97.7 F (36.5 C) 97.7 F (36.5 C)  TempSrc: Oral Oral Oral Oral  SpO2: 97% 97% 100% 100%  Weight:       Height:       Weight change:   Intake/Output Summary (Last 24 hours) at 06/16/2019 1058 Last data filed at 06/16/2019 1000 Gross per 24 hour  Intake 2709.38 ml  Output 600 ml  Net 2109.38 ml       Labs: Basic Metabolic Panel: Recent Labs  Lab 06/15/19 1158 06/16/19 0611  NA 139 140  K 4.7 4.3  CL 111 108  CO2 18* 22  GLUCOSE 201* 230*  BUN 44* 43*  CREATININE 5.42* 5.13*  CALCIUM 7.8* 7.5*  PHOS  --  6.5*   Liver Function Tests: Recent Labs  Lab 06/15/19 1158 06/16/19 0611  AST 8*  --   ALT 5  --   ALKPHOS 70  --   BILITOT 0.3  --   PROT 6.2*  --   ALBUMIN 2.6* 2.1*   Recent Labs  Lab 06/15/19 1158  LIPASE 30   No results for input(s): AMMONIA in the last 168 hours. CBC: Recent Labs  Lab 06/15/19 1158 06/16/19 0611  WBC 10.6*  10.6* 7.3  NEUTROABS 7.5  --   HGB 7.6*  7.5* 6.4*  HCT 23.3*  23.6* 20.1*  MCV 95.9  95.9 94.8  PLT 260  261 216   Cardiac Enzymes: No results for input(s): CKTOTAL, CKMB, CKMBINDEX, TROPONINI in the last 168 hours. CBG: Recent Labs  Lab 06/15/19 1911 06/15/19 2140 06/16/19 0650  GLUCAP 110* 128* 192*    Iron Studies:  Recent Labs    06/16/19 [phone]  IRON 37  TIBC 234*  FERRITIN 37   Studies/Results: Ct Abdomen Pelvis Wo Contrast  Result Date: 06/15/2019 CLINICAL DATA:  Lower abdominal pain for 5 days. EXAM: CT ABDOMEN AND PELVIS WITHOUT CONTRAST TECHNIQUE: Multidetector CT imaging of the abdomen and pelvis was performed following the standard protocol without IV contrast. COMPARISON:  CT abdomen and pelvis 02/08/2019. FINDINGS: Lower chest: Mild dependent atelectasis. No pleural or pericardial effusion. Hepatobiliary: No focal liver abnormality is seen. Status post cholecystectomy. No biliary dilatation. Pancreas: Unremarkable. No pancreatic ductal dilatation or surrounding inflammatory changes. Spleen: Normal in size without focal abnormality. Adrenals/Urinary Tract: Adrenal glands are unremarkable. Kidneys  are normal, without renal calculi, focal lesion, or hydronephrosis. Bladder is unremarkable. Stomach/Bowel: Stomach is within normal limits. Appendix appears normal. No evidence of bowel wall thickening, distention, or inflammatory changes. Vascular/Lymphatic: Aortic atherosclerosis. No enlarged abdominal or pelvic lymph nodes. Reproductive: Uterus and bilateral adnexa are unremarkable. Other: None. Musculoskeletal: No acute or focal abnormality. IMPRESSION: No acute abnormality or finding to explain the patient's symptoms. Negative for urinary tract stone. Atherosclerosis. Electronically Signed   By: Inge Rise M.D.   On: 06/15/2019 13:57   US Renal  Result Date: 06/15/2019 CLINICAL DATA:  Acute kidney injury EXAM: RENAL / URINARY TRACT ULTRASOUND COMPLETE COMPARISON:  02/07/2019, CT 06/15/2019 FINDINGS: Right Kidney: Renal measurements: 10.4 x 4.3 x 4.6 cm = volume: 108.8 mL . Echogenicity within normal limits. No mass or hydronephrosis visualized. Left Kidney: Renal measurements: 11.4 x 5.2 x 4.9 cm = volume: 154.2 mL. Echogenicity within normal limits. No mass or hydronephrosis visualized. Bladder: Appears normal for degree of bladder distention. IMPRESSION: Negative renal ultrasound Electronically Signed   By: Donavan Foil M.D.   On: 06/15/2019 18:05    Medications: Infusions: . ferric gluconate (FERRLECIT/NULECIT) IV    .  sodium bicarbonate (isotonic) infusion in sterile water 75 mL/hr at 06/16/19 1021    Scheduled Medications: . amLODipine  5 mg Oral Daily  . aspirin EC  81 mg Oral Daily  . darbepoetin (ARANESP) injection - NON-DIALYSIS  100 mcg Subcutaneous Once  . lurasidone  20 mg Oral Daily  . nicotine  14 mg Transdermal Daily    have reviewed scheduled and prn medications.  Physical Exam: General:NAD, comfortable Heart:RRR, s1s2 nl Lungs:clear b/l, no crackle Abdomen:soft, Non-tender, non-distended Extremities:No edema, bilateral BKA Neurology: Alert awake and following  commands, no asterixis Skin: No rash or ulcer   Dron Tanna Furry 06/16/2019,10:58 AM  LOS: 0 days  Pager: ID:[phone]

## 2019-06-16 NOTE — Progress Notes (Signed)
This AC called to unit due to the potential for a minor child staying overnight in patient room. Per charge nurse, pt has been advised that her daughter could not stay overnight at the hospital.  This [facility] asked if pt needed resources for her daughter while admitted to the hospital and the pt responded "she could go with her dad, but I want her to stay with me."  I asked the patient if she felt that was the best decision as her daughter could come back at 10 am.  She said, just get the paperwork and discharge me." Pt plans to leave AMA. Spoke with MD and she is aware of above.  [id number]

## 2019-06-16 NOTE — Progress Notes (Signed)
This RN informed patient that since her daughter was 25, that the patients daughter would have to leave at 2000 when visiting hours are over, but that she could return at 1000 tomorrow morning. Patient stated, "I need my daughter! She is the only one that knows how to calm me down. She is my support! If she has to leave then you might as well let me go home now!" [place], [facility] contacted and she stated she would be up to see the patient. Broadus John, MD also notified.

## 2019-06-16 NOTE — Progress Notes (Signed)
Patient left AMA. IV and foley catheter removed. AMA paperwork signed and patient escorted via [place].

## 2019-06-16 NOTE — Progress Notes (Signed)
PROGRESS NOTE    ZORIANA LIMBAUGH  [id number] DOB: 02-20-1981 DOA: 06/15/2019 PCP: Patient, No Pcp Per  Brief Narrative: 38 year old female with history of uncontrolled type 2 diabetes, tobacco abuse, hypertension, bipolar disease, chronic pain, noncompliance presented to the ED with abnormal labs indicating worsening kidney function, creatinine of 5.4 compared to baseline slightly less than 5 -Nephrology was consulted she was also noted to have worsening of anemia of chronic disease  Assessment & Plan:   Progressive CKD 5 -Likely secondary to diabetic nephropathy -Appreciate nephrology consult, no hydronephrosis noted on imaging -Urine output of 400 cc overnight -No symptoms of uremia at this time -Will require hemodialysis in the near future, per renal will also need access planning  Anion gap metabolic acidosis -Due to above, status post IV sodium bicarbonate infusion, cutdown fluids today and start oral bicarb  Progressive anemia of chronic disease -Very mild iron deficiency on anemia panel -No symptoms of upper or lower GI bleed, she still has menstrual cycles -Suspect anemia is predominantly from untreated chronic disease/CKD and component of mild menstrual blood loss -Transfuse 1 unit of PRBC today, renal starting EPO and iron  Hypertension -Continue amlodipine and clonidine  Type 2 diabetes mellitus -Reports no longer taking insulin, -Hemoglobin A1c is 6.4, continue sliding scale  Tobacco abuse -Counseled, nicotine patch  Peripheral vascular disease -Status post bilateral BKA likely secondary to tobacco use and diabetes  DVT prophylaxis: Start Heparin subcutaneous Code Status: Full code Family Communication: Daughter at bedside Disposition Plan: Home next week  Consultants:   Nephrology   Procedures:   Antimicrobials:    Subjective: -Feels better today, surprised that she urinated today -Reports chronic nausea, intermittent vomiting -Denies any  dyspnea -Denies any overt GI blood loss, still has regular menstrual cycles  Objective: Vitals:   06/16/19 0457 06/16/19 1022 06/16/19 1043 06/16/19 1058  BP: 137/89 136/83 136/83 (!) 135/94  Pulse: 80 75 75 73  Resp: 18  18 18   Temp: 97.7 F (36.5 C) 97.7 F (36.5 C) 97.7 F (36.5 C) 97.9 F (36.6 C)  TempSrc: Oral Oral Oral Oral  SpO2: 97% 100% 100% 100%  Weight:      Height:        Intake/Output Summary (Last 24 hours) at 06/16/2019 1205 Last data filed at 06/16/2019 1000 Gross per 24 hour  Intake 2709.38 ml  Output 600 ml  Net 2109.38 ml   Filed Weights   06/15/19 1152 06/15/19 1242  Weight: 72.6 kg 72 kg    Examination:  General exam: Chronically ill-appearing female, appears much older than stated age Respiratory system: Clear bilaterally Cardiovascular system: S1 & S2 heard, RRR. Gastrointestinal system: Abdomen is nondistended, soft and nontender.Normal bowel sounds heard. Central nervous system: Nonfocal Extremities: Bilateral BKA Skin: No rashes, lesions or ulcers Psychiatry: Flat affect    Data Reviewed:   CBC: Recent Labs  Lab 06/15/19 1158 06/16/19 0611  WBC 10.6*   10.6* 7.3  NEUTROABS 7.5  --   HGB 7.6*   7.5* 6.4*  HCT 23.3*   23.6* 20.1*  MCV 95.9   95.9 94.8  PLT 260   261 [id number]   Basic Metabolic Panel: Recent Labs  Lab 06/15/19 1158 06/15/19 1521 06/16/19 0611  NA 139  --  140  K 4.7  --  4.3  CL 111  --  108  CO2 18*  --  22  GLUCOSE 201*  --  230*  BUN 44*  --  43*  CREATININE 5.42*  --  5.13*  CALCIUM 7.8*  --  7.5*  MG  --  1.7  --   PHOS  --   --  6.5*   GFR: Estimated Creatinine Clearance: 15.4 mL/min (A) (by C-G formula based on SCr of 5.13 mg/dL (H)). Liver Function Tests: Recent Labs  Lab 06/15/19 1158 06/16/19 0611  AST 8*  --   ALT 5  --   ALKPHOS 70  --   BILITOT 0.3  --   PROT 6.2*  --   ALBUMIN 2.6* 2.1*   Recent Labs  Lab 06/15/19 1158  LIPASE 30   No results for input(s): AMMONIA in the last  168 hours. Coagulation Profile: No results for input(s): INR, PROTIME in the last 168 hours. Cardiac Enzymes: No results for input(s): CKTOTAL, CKMB, CKMBINDEX, TROPONINI in the last 168 hours. BNP (last 3 results) No results for input(s): PROBNP in the last 8760 hours. HbA1C: Recent Labs    06/15/19 1521  HGBA1C 6.4*   CBG: Recent Labs  Lab 06/15/19 1911 06/15/19 2140 06/16/19 0650 06/16/19 1128  GLUCAP 110* 128* 192* 182*   Lipid Profile: Recent Labs    06/15/19 1524  CHOL 101  HDL 31*  LDLCALC 57  TRIG 66  CHOLHDL 3.3   Thyroid Function Tests: No results for input(s): TSH, T4TOTAL, FREET4, T3FREE, THYROIDAB in the last 72 hours. Anemia Panel: Recent Labs    06/15/19 1521 06/15/19 1532 06/16/19 0611  FERRITIN  --  39 37  TIBC 262  --  234*  IRON 28  --  37   Urine analysis:    Component Value Date/Time   COLORURINE YELLOW 06/15/2019 2252   APPEARANCEUR CLOUDY (A) 06/15/2019 2252   LABSPEC 1.012 06/15/2019 2252   PHURINE 8.0 06/15/2019 2252   GLUCOSEU NEGATIVE 06/15/2019 2252   HGBUR NEGATIVE 06/15/2019 2252   BILIRUBINUR NEGATIVE 06/15/2019 2252   KETONESUR NEGATIVE 06/15/2019 2252   PROTEINUR 100 (A) 06/15/2019 2252   UROBILINOGEN 1.0 02/26/2015 1152   NITRITE NEGATIVE 06/15/2019 2252   LEUKOCYTESUR NEGATIVE 06/15/2019 2252   Sepsis Labs: @LABRCNTIP (procalcitonin:4,lacticidven:4)  ) Recent Results (from the past 240 hour(s))  SARS Coronavirus 2 [facility] order, Performed in [facility] hospital lab) Nasopharyngeal Nasopharyngeal Swab     Status: None   Collection Time: 06/15/19  1:12 PM   Specimen: Nasopharyngeal Swab  Result Value Ref Range Status   SARS Coronavirus 2 NEGATIVE NEGATIVE Final    Comment: (NOTE) If result is NEGATIVE SARS-CoV-2 target nucleic acids are NOT DETECTED. The SARS-CoV-2 RNA is generally detectable in upper and lower  respiratory specimens during the acute phase of infection. The lowest  concentration of  SARS-CoV-2 viral copies this assay can detect is 250  copies / mL. A negative result does not preclude SARS-CoV-2 infection  and should not be used as the sole basis for treatment or other  patient management decisions.  A negative result may occur with  improper specimen collection / handling, submission of specimen other  than nasopharyngeal swab, presence of viral mutation(s) within the  areas targeted by this assay, and inadequate number of viral copies  (<250 copies / mL). A negative result must be combined with clinical  observations, patient history, and epidemiological information. If result is POSITIVE SARS-CoV-2 target nucleic acids are DETECTED. The SARS-CoV-2 RNA is generally detectable in upper and lower  respiratory specimens dur ing the acute phase of infection.  Positive  results are indicative of active infection with SARS-CoV-2.  Clinical  correlation with patient history  and other diagnostic information is  necessary to determine patient infection status.  Positive results do  not rule out bacterial infection or co-infection with other viruses. If result is PRESUMPTIVE POSTIVE SARS-CoV-2 nucleic acids MAY BE PRESENT.   A presumptive positive result was obtained on the submitted specimen  and confirmed on repeat testing.  While 2019 novel coronavirus  (SARS-CoV-2) nucleic acids may be present in the submitted sample  additional confirmatory testing may be necessary for epidemiological  and / or clinical management purposes  to differentiate between  SARS-CoV-2 and other Sarbecovirus currently known to infect humans.  If clinically indicated additional testing with an alternate test  methodology [phone]) is advised. The SARS-CoV-2 RNA is generally  detectable in upper and lower respiratory sp ecimens during the acute  phase of infection. The expected result is Negative. Fact Sheet for Patients:  StrictlyIdeas.no Fact Sheet for Healthcare  Providers: BankingDealers.co.za This test is not yet approved or cleared by the [place] FDA and has been authorized for detection and/or diagnosis of SARS-CoV-2 by FDA under an Emergency Use Authorization (EUA).  This EUA will remain in effect (meaning this test can be used) for the duration of the COVID-19 declaration under Section 564(b)(1) of the Act, 21 U.S.C. section 360bbb-3(b)(1), unless the authorization is terminated or revoked sooner. Performed at [facility], [place] [address] [place], [place] [address]          Radiology Studies: Ct Abdomen Pelvis Wo Contrast  Result Date: 06/15/2019 CLINICAL DATA:  Lower abdominal pain for 5 days. EXAM: CT ABDOMEN AND PELVIS WITHOUT CONTRAST TECHNIQUE: Multidetector CT imaging of the abdomen and pelvis was performed following the standard protocol without IV contrast. COMPARISON:  CT abdomen and pelvis 02/08/2019. FINDINGS: Lower chest: Mild dependent atelectasis. No pleural or pericardial effusion. Hepatobiliary: No focal liver abnormality is seen. Status post cholecystectomy. No biliary dilatation. Pancreas: Unremarkable. No pancreatic ductal dilatation or surrounding inflammatory changes. Spleen: Normal in size without focal abnormality. Adrenals/Urinary Tract: Adrenal glands are unremarkable. Kidneys are normal, without renal calculi, focal lesion, or hydronephrosis. Bladder is unremarkable. Stomach/Bowel: Stomach is within normal limits. Appendix appears normal. No evidence of bowel wall thickening, distention, or inflammatory changes. Vascular/Lymphatic: Aortic atherosclerosis. No enlarged abdominal or pelvic lymph nodes. Reproductive: Uterus and bilateral adnexa are unremarkable. Other: None. Musculoskeletal: No acute or focal abnormality. IMPRESSION: No acute abnormality or finding to explain the patient's symptoms. Negative for urinary tract stone. Atherosclerosis. Electronically Signed   By: Inge Rise  M.D.   On: 06/15/2019 13:57   US Renal  Result Date: 06/15/2019 CLINICAL DATA:  Acute kidney injury EXAM: RENAL / URINARY TRACT ULTRASOUND COMPLETE COMPARISON:  02/07/2019, CT 06/15/2019 FINDINGS: Right Kidney: Renal measurements: 10.4 x 4.3 x 4.6 cm = volume: 108.8 mL . Echogenicity within normal limits. No mass or hydronephrosis visualized. Left Kidney: Renal measurements: 11.4 x 5.2 x 4.9 cm = volume: 154.2 mL. Echogenicity within normal limits. No mass or hydronephrosis visualized. Bladder: Appears normal for degree of bladder distention. IMPRESSION: Negative renal ultrasound Electronically Signed   By: Donavan Foil M.D.   On: 06/15/2019 18:05        Scheduled Meds:  amLODipine  5 mg Oral Daily   aspirin EC  81 mg Oral Daily   darbepoetin (ARANESP) injection - NON-DIALYSIS  100 mcg Subcutaneous Once   lurasidone  20 mg Oral Daily   nicotine  14 mg Transdermal Daily   sodium bicarbonate  1,300 mg Oral BID   Continuous  Infusions:  ferric gluconate (FERRLECIT/NULECIT) IV       LOS: 0 days    Time spent: 67min    Domenic Polite, MD [facility] 06/16/2019, 12:05 PM

## 2019-06-16 NOTE — Progress Notes (Signed)
Patient stated, "If I am going to be on a diet (renal) then the doctor might as well let me go now because I can't do it. I'm not going to do it!" This RN provided education on the importance of following a renal diet, especially at this time. Patient still refusing to follow diet. Carolin Sicks, MD notified and made aware.

## 2019-06-17 LAB — BPAM RBC
Blood Product Expiration Date: 202009302359
ISSUE DATE / TIME: 202009051037
Unit Type and Rh: 6200

## 2019-06-17 LAB — TYPE AND SCREEN
ABO/RH(D): A POS
Antibody Screen: NEGATIVE
Unit division: 0

## 2019-06-18 LAB — URINE CULTURE: Culture: 80000 — AB

## 2019-06-18 LAB — PTH, INTACT AND CALCIUM

## 2019-06-30 NOTE — Discharge Summary (Signed)
Physician Discharge Summary  Kathy Phelps [id number] DOB: 02-07-1981 DOA: 06/15/2019  PCP: Patient, No Pcp Per  Admit date: 06/15/2019 Discharge date: 06/16/2019  Time spent: 40minutes  Recommendations for Outpatient Follow-up:  Left AMA  Discharge Diagnoses:  AGAINST MEDICAL ADVICE Progressive CKD 5 Type 2 diabetes mellitus Anemia of chronic disease Iron deficiency anemia Tobacco abuse Peripheral vascular disease Bilateral BKA   Discharge Condition:   Diet recommendation:  Filed Weights   06/15/19 1152 06/15/19 1242  Weight: 72.6 kg 72 kg    History of present illness:  38 year old female with history of uncontrolled type 2 diabetes, tobacco abuse, hypertension, bipolar disease, chronic pain, noncompliance presented to the ED with abnormal labs indicating worsening kidney function, creatinine of 5.4 compared to baseline slightly less than 5  Hospital Course:   Progressive CKD 5 -Likely secondary to diabetic nephropathy -Urine output of 400 cc overnight -No symptoms of uremia at this time -Nephrology consulted, patient left AMA 9/5 pm since per hospital admin/policy young daughter could not be allowed to stay in patient's room overnight with her  Anion gap metabolic acidosis -Due to above, status post IV sodium bicarbonate infusion,  -Left AMA  Progressive anemia of chronic disease -Very mild iron deficiency on anemia panel -No symptoms of upper or lower GI bleed, she still has menstrual cycles -Suspect anemia is predominantly from untreated chronic disease/CKD and component of mild menstrual blood loss -Transfused 1 unit of PRBC today, renal started EPO and iron  Hypertension -Continue amlodipine and clonidine  Type 2 diabetes mellitus -Reports no longer taking insulin, -Hemoglobin A1c is 6.4, continue sliding scale  Tobacco abuse -Counseled, nicotine patch  Peripheral vascular disease -Status post bilateral BKA likely secondary to tobacco use and  diabetes   Consultations:  Renal  Discharge Exam: Vitals:   06/16/19 1345 06/16/19 1638  BP: (!) 146/96 136/73  Pulse: 76 73  Resp: 18 18  Temp: 97.7 F (36.5 C) 97.8 F (36.6 C)  SpO2: 100% 100%    Discharge Instructions    Allergies as of 06/16/2019      Reactions   Hydrocodone-acetaminophen Hives   Tylenol [acetaminophen] Itching, Swelling   Patient tolerated APAP 650 mg (06/29/18) as well as oxycodone/APAP 5-325 mg during 06/25/2018 admission ??      Medication List         Allergies  Allergen Reactions  . Hydrocodone-Acetaminophen Hives  . Tylenol [Acetaminophen] Itching and Swelling    Patient tolerated APAP 650 mg (06/29/18) as well as oxycodone/APAP 5-325 mg during 06/25/2018 admission ??      The results of significant diagnostics from this hospitalization (including imaging, microbiology, ancillary and laboratory) are listed below for reference.    Significant Diagnostic Studies: Ct Abdomen Pelvis Wo Contrast  Result Date: 06/15/2019 CLINICAL DATA:  Lower abdominal pain for 5 days. EXAM: CT ABDOMEN AND PELVIS WITHOUT CONTRAST TECHNIQUE: Multidetector CT imaging of the abdomen and pelvis was performed following the standard protocol without IV contrast. COMPARISON:  CT abdomen and pelvis 02/08/2019. FINDINGS: Lower chest: Mild dependent atelectasis. No pleural or pericardial effusion. Hepatobiliary: No focal liver abnormality is seen. Status post cholecystectomy. No biliary dilatation. Pancreas: Unremarkable. No pancreatic ductal dilatation or surrounding inflammatory changes. Spleen: Normal in size without focal abnormality. Adrenals/Urinary Tract: Adrenal glands are unremarkable. Kidneys are normal, without renal calculi, focal lesion, or hydronephrosis. Bladder is unremarkable. Stomach/Bowel: Stomach is within normal limits. Appendix appears normal. No evidence of bowel wall thickening, distention, or inflammatory changes. Vascular/Lymphatic: Aortic  atherosclerosis. No  enlarged abdominal or pelvic lymph nodes. Reproductive: Uterus and bilateral adnexa are unremarkable. Other: None. Musculoskeletal: No acute or focal abnormality. IMPRESSION: No acute abnormality or finding to explain the patient's symptoms. Negative for urinary tract stone. Atherosclerosis. Electronically Signed   By: Inge Rise M.D.   On: 06/15/2019 13:57   US Renal  Result Date: 06/15/2019 CLINICAL DATA:  Acute kidney injury EXAM: RENAL / URINARY TRACT ULTRASOUND COMPLETE COMPARISON:  02/07/2019, CT 06/15/2019 FINDINGS: Right Kidney: Renal measurements: 10.4 x 4.3 x 4.6 cm = volume: 108.8 mL . Echogenicity within normal limits. No mass or hydronephrosis visualized. Left Kidney: Renal measurements: 11.4 x 5.2 x 4.9 cm = volume: 154.2 mL. Echogenicity within normal limits. No mass or hydronephrosis visualized. Bladder: Appears normal for degree of bladder distention. IMPRESSION: Negative renal ultrasound Electronically Signed   By: Donavan Foil M.D.   On: 06/15/2019 18:05    Microbiology: No results found for this or any previous visit (from the past 240 hour(s)).   Labs: Basic Metabolic Panel: No results for input(s): NA, K, CL, CO2, GLUCOSE, BUN, CREATININE, CALCIUM, MG, PHOS in the last 168 hours. Liver Function Tests: No results for input(s): AST, ALT, ALKPHOS, BILITOT, PROT, ALBUMIN in the last 168 hours. No results for input(s): LIPASE, AMYLASE in the last 168 hours. No results for input(s): AMMONIA in the last 168 hours. CBC: No results for input(s): WBC, NEUTROABS, HGB, HCT, MCV, PLT in the last 168 hours. Cardiac Enzymes: No results for input(s): CKTOTAL, CKMB, CKMBINDEX, TROPONINI in the last 168 hours. BNP: BNP (last 3 results) No results for input(s): BNP in the last 8760 hours.  ProBNP (last 3 results) No results for input(s): PROBNP in the last 8760 hours.  CBG: No results for input(s): GLUCAP in the last 168 hours.     Signed:  Domenic Polite MD.  [facility] 06/30/2019, 1:51 PM

## 2019-08-17 ENCOUNTER — Other Ambulatory Visit: Payer: Self-pay

## 2019-08-17 DIAGNOSIS — N184 Chronic kidney disease, stage 4 (severe): Secondary | ICD-10-CM

## 2019-08-20 ENCOUNTER — Emergency Department: Payer: Medicare Other

## 2019-08-20 ENCOUNTER — Inpatient Hospital Stay
Admission: EM | Admit: 2019-08-20 | Discharge: 2019-08-20 | DRG: 194 | Discharge status: Against Medical Advice | Payer: Medicare Other | Attending: Internal Medicine | Admitting: Internal Medicine

## 2019-08-20 ENCOUNTER — Inpatient Hospital Stay (HOSPITAL_COMMUNITY)
Admit: 2019-08-20 | Discharge: 2019-08-20 | Disposition: A | Discharge status: Home / Self Care | Payer: Medicare Other | Attending: Family Medicine | Admitting: Family Medicine

## 2019-08-20 ENCOUNTER — Encounter: Payer: Self-pay | Admitting: Emergency Medicine

## 2019-08-20 ENCOUNTER — Other Ambulatory Visit: Payer: Self-pay

## 2019-08-20 DIAGNOSIS — F209 Schizophrenia, unspecified: Secondary | ICD-10-CM | POA: Diagnosis present

## 2019-08-20 DIAGNOSIS — Z885 Allergy status to narcotic agent status: Secondary | ICD-10-CM

## 2019-08-20 DIAGNOSIS — E1122 Type 2 diabetes mellitus with diabetic chronic kidney disease: Secondary | ICD-10-CM | POA: Diagnosis present

## 2019-08-20 DIAGNOSIS — I69311 Memory deficit following cerebral infarction: Secondary | ICD-10-CM

## 2019-08-20 DIAGNOSIS — Z8249 Family history of ischemic heart disease and other diseases of the circulatory system: Secondary | ICD-10-CM | POA: Diagnosis not present

## 2019-08-20 DIAGNOSIS — N179 Acute kidney failure, unspecified: Secondary | ICD-10-CM | POA: Diagnosis present

## 2019-08-20 DIAGNOSIS — K219 Gastro-esophageal reflux disease without esophagitis: Secondary | ICD-10-CM | POA: Diagnosis present

## 2019-08-20 DIAGNOSIS — E785 Hyperlipidemia, unspecified: Secondary | ICD-10-CM | POA: Diagnosis present

## 2019-08-20 DIAGNOSIS — N185 Chronic kidney disease, stage 5: Secondary | ICD-10-CM | POA: Diagnosis present

## 2019-08-20 DIAGNOSIS — N186 End stage renal disease: Secondary | ICD-10-CM

## 2019-08-20 DIAGNOSIS — J189 Pneumonia, unspecified organism: Principal | ICD-10-CM | POA: Diagnosis present

## 2019-08-20 DIAGNOSIS — I248 Other forms of acute ischemic heart disease: Secondary | ICD-10-CM | POA: Diagnosis present

## 2019-08-20 DIAGNOSIS — Z89512 Acquired absence of left leg below knee: Secondary | ICD-10-CM | POA: Diagnosis not present

## 2019-08-20 DIAGNOSIS — Z833 Family history of diabetes mellitus: Secondary | ICD-10-CM | POA: Diagnosis not present

## 2019-08-20 DIAGNOSIS — N189 Chronic kidney disease, unspecified: Secondary | ICD-10-CM

## 2019-08-20 DIAGNOSIS — Z886 Allergy status to analgesic agent status: Secondary | ICD-10-CM

## 2019-08-20 DIAGNOSIS — I340 Nonrheumatic mitral (valve) insufficiency: Secondary | ICD-10-CM

## 2019-08-20 DIAGNOSIS — R778 Other specified abnormalities of plasma proteins: Secondary | ICD-10-CM

## 2019-08-20 DIAGNOSIS — Z794 Long term (current) use of insulin: Secondary | ICD-10-CM

## 2019-08-20 DIAGNOSIS — Z20828 Contact with and (suspected) exposure to other viral communicable diseases: Secondary | ICD-10-CM | POA: Diagnosis present

## 2019-08-20 DIAGNOSIS — I160 Hypertensive urgency: Secondary | ICD-10-CM | POA: Diagnosis present

## 2019-08-20 DIAGNOSIS — R0602 Shortness of breath: Secondary | ICD-10-CM | POA: Diagnosis present

## 2019-08-20 DIAGNOSIS — D638 Anemia in other chronic diseases classified elsewhere: Secondary | ICD-10-CM

## 2019-08-20 DIAGNOSIS — J45909 Unspecified asthma, uncomplicated: Secondary | ICD-10-CM | POA: Diagnosis present

## 2019-08-20 DIAGNOSIS — F319 Bipolar disorder, unspecified: Secondary | ICD-10-CM

## 2019-08-20 DIAGNOSIS — E875 Hyperkalemia: Secondary | ICD-10-CM | POA: Diagnosis present

## 2019-08-20 DIAGNOSIS — I120 Hypertensive chronic kidney disease with stage 5 chronic kidney disease or end stage renal disease: Secondary | ICD-10-CM | POA: Diagnosis present

## 2019-08-20 DIAGNOSIS — F419 Anxiety disorder, unspecified: Secondary | ICD-10-CM | POA: Diagnosis present

## 2019-08-20 DIAGNOSIS — D631 Anemia in chronic kidney disease: Secondary | ICD-10-CM | POA: Diagnosis present

## 2019-08-20 DIAGNOSIS — F17210 Nicotine dependence, cigarettes, uncomplicated: Secondary | ICD-10-CM | POA: Diagnosis present

## 2019-08-20 DIAGNOSIS — Z89511 Acquired absence of right leg below knee: Secondary | ICD-10-CM

## 2019-08-20 DIAGNOSIS — E1169 Type 2 diabetes mellitus with other specified complication: Secondary | ICD-10-CM

## 2019-08-20 DIAGNOSIS — E1142 Type 2 diabetes mellitus with diabetic polyneuropathy: Secondary | ICD-10-CM | POA: Diagnosis present

## 2019-08-20 LAB — BASIC METABOLIC PANEL
Anion gap: 8 (ref 5–15)
BUN: 46 mg/dL — ABNORMAL HIGH (ref 6–20)
CO2: 16 mmol/L — ABNORMAL LOW (ref 22–32)
Calcium: 7.8 mg/dL — ABNORMAL LOW (ref 8.9–10.3)
Chloride: 113 mmol/L — ABNORMAL HIGH (ref 98–111)
Creatinine, Ser: 5.8 mg/dL — ABNORMAL HIGH (ref 0.44–1.00)
GFR calc Af Amer: 10 mL/min — ABNORMAL LOW (ref >60)
GFR calc non Af Amer: 8 mL/min — ABNORMAL LOW (ref >60)
Glucose, Bld: 124 mg/dL — ABNORMAL HIGH (ref 70–99)
Potassium: 5.3 mmol/L — ABNORMAL HIGH (ref 3.5–5.1)
Sodium: 137 mmol/L (ref 135–145)

## 2019-08-20 LAB — GLUCOSE, CAPILLARY
Glucose-Capillary: 121 mg/dL — ABNORMAL HIGH (ref 70–99)
Glucose-Capillary: 77 mg/dL (ref 70–99)

## 2019-08-20 LAB — CBC
HCT: 21.1 % — ABNORMAL LOW (ref 36.0–46.0)
Hemoglobin: 6.8 g/dL — ABNORMAL LOW (ref 12.0–15.0)
MCH: 30.1 pg (ref 26.0–34.0)
MCHC: 32.2 g/dL (ref 30.0–36.0)
MCV: 93.4 fL (ref 80.0–100.0)
Platelets: 145 10*3/uL — ABNORMAL LOW (ref 150–400)
RBC: 2.26 MIL/uL — ABNORMAL LOW (ref 3.87–5.11)
RDW: 13.6 % (ref 11.5–15.5)
WBC: 9.8 10*3/uL (ref 4.0–10.5)
nRBC: 0 % (ref 0.0–0.2)

## 2019-08-20 LAB — ECHOCARDIOGRAM COMPLETE: Weight: 2640 oz

## 2019-08-20 LAB — TROPONIN I (HIGH SENSITIVITY)
Troponin I (High Sensitivity): 229 ng/L (ref <18)
Troponin I (High Sensitivity): 235 ng/L (ref <18)
Troponin I (High Sensitivity): 218 ng/L (ref <18)

## 2019-08-20 LAB — SARS CORONAVIRUS 2 (TAT 6-24 HRS): SARS Coronavirus 2: NEGATIVE

## 2019-08-20 MED ORDER — SODIUM CHLORIDE 0.9 % IV BOLUS
1000.0000 mL | Freq: Once | INTRAVENOUS | Status: AC
  Administered 2019-08-20: 1000 mL via INTRAVENOUS

## 2019-08-20 MED ORDER — INSULIN ASPART 100 UNIT/ML ~~LOC~~ SOLN
10.0000 [IU] | Freq: Once | SUBCUTANEOUS | Status: AC
  Administered 2019-08-20: 10 [IU] via INTRAVENOUS
  Filled 2019-08-20: qty 1

## 2019-08-20 MED ORDER — GABAPENTIN 800 MG PO TABS: 800.0000 mg | ORAL_TABLET | Freq: Four times a day (QID) | ORAL | Status: DC

## 2019-08-20 MED ORDER — LURASIDONE HCL 20 MG PO TABS: 20.0000 mg | ORAL_TABLET | Freq: Every day | ORAL | Status: DC

## 2019-08-20 MED ORDER — LURASIDONE HCL 40 MG PO TABS
40.0000 mg | ORAL_TABLET | Freq: Every day | ORAL | Status: DC
  Filled 2019-08-20: qty 1

## 2019-08-20 MED ORDER — HEPARIN SODIUM (PORCINE) 5000 UNIT/ML IJ SOLN: 5000.0000 [IU] | Freq: Three times a day (TID) | INTRAMUSCULAR | Status: DC

## 2019-08-20 MED ORDER — FUROSEMIDE 10 MG/ML IJ SOLN
20.0000 mg | Freq: Once | INTRAMUSCULAR | Status: AC
  Administered 2019-08-20: 20 mg via INTRAVENOUS
  Filled 2019-08-20: qty 4

## 2019-08-20 MED ORDER — SODIUM CHLORIDE 0.9 % IV SOLN
1.0000 g | Freq: Once | INTRAVENOUS | Status: AC
  Administered 2019-08-20: 03:00:00 1 g via INTRAVENOUS
  Filled 2019-08-20: qty 1

## 2019-08-20 MED ORDER — DEXTROSE 50 % IV SOLN
1.0000 | Freq: Once | INTRAVENOUS | Status: AC
  Administered 2019-08-20: 50 mL via INTRAVENOUS
  Filled 2019-08-20: qty 50

## 2019-08-20 MED ORDER — INSULIN ASPART 100 UNIT/ML ~~LOC~~ SOLN
0.0000 [IU] | Freq: Three times a day (TID) | SUBCUTANEOUS | Status: DC
  Administered 2019-08-20: 2 [IU] via SUBCUTANEOUS
  Filled 2019-08-20: qty 1

## 2019-08-20 MED ORDER — DULOXETINE HCL 60 MG PO CPEP: 90.0000 mg | ORAL_CAPSULE | ORAL | Status: DC

## 2019-08-20 MED ORDER — DIPHENHYDRAMINE HCL 50 MG/ML IJ SOLN: 25.0000 mg | Freq: Four times a day (QID) | INTRAMUSCULAR | Status: DC | PRN

## 2019-08-20 MED ORDER — ALPRAZOLAM 0.5 MG PO TABS: 2.0000 mg | ORAL_TABLET | Freq: Four times a day (QID) | ORAL | Status: DC | PRN

## 2019-08-20 MED ORDER — VANCOMYCIN HCL IN DEXTROSE 1-5 GM/200ML-% IV SOLN
1000.0000 mg | Freq: Once | INTRAVENOUS | Status: AC
  Administered 2019-08-20: 1000 mg via INTRAVENOUS
  Filled 2019-08-20: qty 200

## 2019-08-20 MED ORDER — IPRATROPIUM-ALBUTEROL 0.5-2.5 (3) MG/3ML IN SOLN: 3.0000 mL | Freq: Four times a day (QID) | RESPIRATORY_TRACT | Status: DC | PRN

## 2019-08-20 MED ORDER — ALPRAZOLAM 0.5 MG PO TABS
1.0000 mg | ORAL_TABLET | Freq: Three times a day (TID) | ORAL | Status: DC | PRN
  Administered 2019-08-20: 10:00:00 1 mg via ORAL
  Filled 2019-08-20: qty 2

## 2019-08-20 MED ORDER — OXYCODONE HCL 5 MG PO TABS
5.0000 mg | ORAL_TABLET | Freq: Four times a day (QID) | ORAL | Status: DC | PRN
  Administered 2019-08-20: 5 mg via ORAL
  Filled 2019-08-20: qty 1

## 2019-08-20 MED ORDER — SODIUM CHLORIDE 0.9 % IV SOLN
500.0000 mg | INTRAVENOUS | Status: DC
  Administered 2019-08-20: 500 mg via INTRAVENOUS
  Filled 2019-08-20: qty 500

## 2019-08-20 MED ORDER — LABETALOL HCL 5 MG/ML IV SOLN: 20.0000 mg | INTRAVENOUS | Status: DC | PRN

## 2019-08-20 MED ORDER — DIPHENHYDRAMINE HCL 25 MG PO CAPS: 25.0000 mg | ORAL_CAPSULE | ORAL | Status: DC | PRN

## 2019-08-20 MED ORDER — AMLODIPINE BESYLATE 5 MG PO TABS
5.0000 mg | ORAL_TABLET | Freq: Every day | ORAL | Status: DC
  Administered 2019-08-20: 5 mg via ORAL
  Filled 2019-08-20: qty 1

## 2019-08-20 MED ORDER — SODIUM CHLORIDE 0.9 % IV SOLN
2.0000 g | INTRAVENOUS | Status: DC
  Administered 2019-08-20: 10:00:00 2 g via INTRAVENOUS
  Filled 2019-08-20: qty 20

## 2019-08-20 MED ORDER — GUAIFENESIN ER 600 MG PO TB12
600.0000 mg | ORAL_TABLET | Freq: Two times a day (BID) | ORAL | Status: DC
  Administered 2019-08-20 (×2): 600 mg via ORAL
  Filled 2019-08-20 (×2): qty 1

## 2019-08-20 MED ORDER — SODIUM BICARBONATE 8.4 % IV SOLN
50.0000 meq | Freq: Once | INTRAVENOUS | Status: AC
  Administered 2019-08-20: 50 meq via INTRAVENOUS
  Filled 2019-08-20: qty 50

## 2019-08-20 NOTE — ED Provider Notes (Signed)
[facility] Emergency Department Provider Note  ____________________________________________  Time seen: Approximately 2:16 AM  I have reviewed the triage vital signs and the nursing notes.   HISTORY  Chief Complaint Cough and Shortness of Breath   HPI Kathy Phelps is a 38 y.o. female the history of bilateral BKA's, chronic kidney disease stage V, schizophrenia, hypertension, hyperlipidemia, diabetes who presents for evaluation of cough and shortness of breath.  Patient reports 2 days of shortness of breath worse with movement and laying down and a dry cough.  No fever or chills, no body aches, no known exposures to Covid, no vomiting or diarrhea, no chest pain, no sore throat, no loss of taste or smell.  Her symptoms have been getting progressively worse and are currently constant and moderate in intensity.   Past Medical History:  Diagnosis Date  . Anxiety   . Asthma   . Bipolar affective ([place])   . Complete miscarriage   . Constipation   . 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 Depression   . Diabetes mellitus    Type II  . Dyspnea     " only at night and I stop breathing in my sleep too for about the last three weeks"  . GERD (gastroesophageal reflux disease)   . Headache    "migraraines"  . Hyperlipidemia   . Hypertension   . Osteomyelitis ([place])    left foot  . Pregnancy complication    HELP Syndrome  . Renal disorder   . Schizophrenia ([place])   . Stroke [facility])    " mild stroke", memory loss- approx 2017    Patient Active Problem List   Diagnosis Date Noted  . 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[place]) 02/07/2019  . Acute on chronic renal failure ([place]) 02/07/2019  . Anxiety and depression   . 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[place]) 08/11/2018  . CKD stage 3 secondary to diabetes ([place]) 08/11/2018  . Uncontrolled diabetes mellitus type 2 with peripheral artery disease ([place]) 08/11/2018  . Gangrene of right foot ([place])   . Lower limb  ischemia 06/25/2018  . Lymphangitis 06/25/2018  . Menorrhagia 06/25/2018  . Hx of BKA, left ([place]) 05/24/2018  . History of left below knee amputation ([place]) 02/01/2018  . Gangrene of left foot ([place])   . Dehiscence of amputation stump ([place]) 01/26/2018  . Surgical wound, non healing 09/02/2017  . Bipolar 1 disorder ([place]) 09/02/2017  . Nausea vomiting and diarrhea 08/11/2017  . Great toe pain, left 08/11/2017  . GERD (gastroesophageal reflux disease) 08/10/2017  . Anxiety 08/10/2017  . Edema   . Cellulitis   . AKI (acute kidney injury) ([place]) 02/04/2017  . Type II diabetes mellitus with renal manifestations, uncontrolled ([place]) 02/02/2017  . HLD (hyperlipidemia) 02/02/2017  . Tobacco abuse 02/02/2017  . Essential hypertension 02/02/2017  . Malnutrition of moderate degree 02/02/2017    Past Surgical History:  Procedure Laterality Date  . ABDOMINAL AORTOGRAM N/A 09/07/2017   Procedure: ABDOMINAL AORTOGRAM;  Surgeon: Waynetta Sandy, MD;  Location: [place] [facility];  Service: Cardiovascular;  Laterality: N/A;  . ABDOMINAL AORTOGRAM W/LOWER EXTREMITY N/A 02/03/2017   Procedure: Abdominal Aortogram w/Lower Extremity;  Surgeon: Waynetta Sandy, MD;  Location: [place] [facility];  Service: Cardiovascular;  Laterality: N/A;  . ABDOMINAL AORTOGRAM W/LOWER EXTREMITY N/A 06/27/2018   Procedure: ABDOMINAL AORTOGRAM W/LOWER EXTREMITY;  Surgeon: Serafina Mitchell, MD;  Location: [place] [facility];  Service:  Cardiovascular;  Laterality: N/A;  . AMPUTATION Left 02/10/2017   Procedure: AMPUTATION TOES 3, 4 AND 5  LEFT FOOT;  Surgeon: Waynetta Sandy, MD;  Location: [place];  Service: Vascular;  Laterality: Left;  . AMPUTATION Left 12/21/2017   Procedure: LEFT FOOT 5TH RAY AMPUTATION;  Surgeon: Newt Minion, MD;  Location: [place];  Service: Orthopedics;  Laterality: Left;  . AMPUTATION Left 02/01/2018   Procedure: LEFT BELOW KNEE AMPUTATION;  Surgeon: Newt Minion, MD;  Location:  [place];  Service: Orthopedics;  Laterality: Left;  . AMPUTATION Right 08/11/2018   Procedure: AMPUTATION BELOW KNEE;  Surgeon: Newt Minion, MD;  Location: [place];  Service: Orthopedics;  Laterality: Right;  . CESAREAN SECTION    . CHOLECYSTECTOMY    . LOWER EXTREMITY ANGIOGRAPHY Bilateral 09/07/2017   Procedure: Lower Extremity Angiography;  Surgeon: Waynetta Sandy, MD;  Location: [place] [facility];  Service: Cardiovascular;  Laterality: Bilateral;  . PERIPHERAL VASCULAR ATHERECTOMY  02/03/2017   Procedure: Peripheral Vascular Atherectomy;  Surgeon: Waynetta Sandy, MD;  Location: [place] [facility];  Service: Cardiovascular;;  . PERIPHERAL VASCULAR ATHERECTOMY Right 06/27/2018   Procedure: PERIPHERAL VASCULAR ATHERECTOMY;  Surgeon: Serafina Mitchell, MD;  Location: [place] [facility];  Service: Cardiovascular;  Laterality: Right;  superficial femoral  . PERIPHERAL VASCULAR BALLOON ANGIOPLASTY  02/03/2017   Procedure: Peripheral Vascular Balloon Angioplasty;  Surgeon: Waynetta Sandy, MD;  Location: [place] [facility];  Service: Cardiovascular;;  . PERIPHERAL VASCULAR INTERVENTION Left 09/07/2017   Procedure: PERIPHERAL VASCULAR INTERVENTION;  Surgeon: Waynetta Sandy, MD;  Location: [place] [facility];  Service: Cardiovascular;  Laterality: Left;  SFA/POPLITEAL  . STUMP REVISION Left 05/24/2018  . STUMP REVISION Left 05/24/2018   Procedure: REVISION LEFT BELOW KNEE AMPUTATION;  Surgeon: Newt Minion, MD;  Location: [place];  Service: Orthopedics;  Laterality: Left;  . TUBAL LIGATION      Prior to Admission medications   Medication Sig Start Date End Date Taking? Authorizing Provider  ADVAIR DISKUS 100-50 MCG/DOSE AEPB Inhale 1 puff into the lungs daily. 01/10/15   [provider]  ALPRAZolam Duanne Moron) 1 MG tablet Take 1 mg by mouth 3 (three) times daily as needed for anxiety.  06/07/19   [provider]  alprazolam Duanne Moron) 2 MG tablet Take 1  tablet (2 mg total) by mouth 4 (four) times daily as needed for sleep. Patient not taking: Reported on 06/15/2019 08/12/17   Debbe Odea, MD  amLODipine (NORVASC) 5 MG tablet Take 5 mg by mouth daily. 06/13/19   [provider]  atorvastatin (LIPITOR) 80 MG tablet Take 1 tablet (80 mg total) by mouth daily at 6 PM. Patient not taking: Reported on 06/15/2019 07/03/18   [name], MD  cloNIDine (CATAPRES) 0.1 MG tablet Take 0.1 mg by mouth 2 (two) times daily. 05/12/18   [provider]  DULoxetine (CYMBALTA) 30 MG capsule Take 90 mg by mouth every morning.  06/09/18   [provider]  gabapentin (NEURONTIN) 800 MG tablet Take 1 tablet (800 mg total) by mouth 2 (two) times daily. Patient taking differently: Take 800 mg by mouth 4 (four) times daily.  02/11/17   Hosie Poisson, MD  insulin glargine (LANTUS) 100 UNIT/ML injection Inject 0.15 mLs (15 Units total) into the skin at bedtime. 20 U in the morning and 30 U in the evening Patient not taking: Reported on 06/15/2019 08/15/18   Bonnielee Haff, MD  Insulin Syringe-Needle U-100 (  INSULIN SYRINGE .5CC/31GX5/16") 31G X 5/16" 0.5 ML MISC 1 each by Does not apply route QID. 08/12/17   Gherghe, Costin M, MD  LATUDA 20 MG TABS tablet Take 20 mg by mouth daily.  06/07/19   [provider]  mirtazapine (REMERON) 15 MG tablet Take 15 mg by mouth at bedtime. 12/22/17   [provider]  NOVOLOG 100 UNIT/ML injection Inject 0-30 Units into the skin 3 (three) times daily with meals. Sliding Scale:  >300 15 units < 300 do not use Patient not taking: Reported on 06/15/2019 08/12/17   Caren Griffins, MD  oxyCODONE (OXY IR/ROXICODONE) 5 MG immediate release tablet Take 5 mg by mouth 4 (four) times daily. 06/13/19   [provider]  pantoprazole (PROTONIX) 40 MG tablet Take 1 tablet (40 mg total) by mouth daily. Patient not taking: Reported on 06/15/2019 02/11/17   Hosie Poisson, MD  pentoxifylline (TRENTAL) 400 MG CR tablet  Take 1 tablet (400 mg total) by mouth 3 (three) times daily with meals. Patient not taking: Reported on 06/15/2019 01/16/18   Newt Minion, MD  PROAIR HFA 108 [phone] BASE) MCG/ACT inhaler Inhale 2 puffs into the lungs every 6 (six) hours as needed for shortness of breath.  01/10/15   [provider]    Allergies Hydrocodone-acetaminophen and Tylenol [acetaminophen]  Family History  Problem Relation Age of Onset  . Diabetes Mellitus II Mother   . Heart disease Mother   . Heart disease Father     Social History Social History   Tobacco Use  . Smoking status: Current Every Day Smoker    Packs/day: 1.00    Years: 28.00    Pack years: 28.00    Types: Cigarettes  . Smokeless tobacco: Never Used  Substance Use Topics  . Alcohol use: No  . Drug use: No    Review of Systems  Constitutional: Negative for fever. Eyes: Negative for visual changes. ENT: Negative for sore throat. Neck: No neck pain  Cardiovascular: Negative for chest pain. Respiratory: + shortness of breath and cough Gastrointestinal: Negative for abdominal pain, vomiting or diarrhea. Genitourinary: Negative for dysuria. Musculoskeletal: Negative for back pain. Skin: Negative for rash. Neurological: Negative for headaches, weakness or numbness. Psych: No SI or HI  ____________________________________________   PHYSICAL EXAM:  VITAL SIGNS: ED Triage Vitals [08/20/19 0029]  Enc Vitals Group     BP (!) 183/93     Pulse Rate (!) 101     Resp 18     Temp 98.6 F (37 C)     Temp Source Oral     SpO2 99 %     Weight 165 lb (74.8 kg)     Height      Head Circumference      Peak Flow      Pain Score 8     Pain Loc      Pain Edu?      Excl. in [place]?     Constitutional: Alert and oriented. Well appearing and in no apparent distress. HEENT:      Head: Normocephalic and atraumatic.         Eyes: Conjunctivae are normal. Sclera is non-icteric.       Mouth/Throat: Mucous membranes are moist.       Neck:  Supple with no signs of meningismus. Cardiovascular: Regular rate and rhythm. No murmurs, gallops, or rubs. 2+ symmetrical distal pulses are present in all extremities. No JVD. Respiratory: Normal respiratory effort. Lungs are clear to  auscultation bilaterally. No wheezes, crackles, or rhonchi.  Gastrointestinal: Soft, non tender, and non distended with positive bowel sounds. No rebound or guarding. Musculoskeletal: b/l BKA Neurologic: Normal speech and language. Face is symmetric. Moving all extremities. No gross focal neurologic deficits are appreciated. Skin: Skin is warm, dry and intact. No rash noted. Psychiatric: Mood and affect are normal. Speech and behavior are normal.  ____________________________________________   LABS (all labs ordered are listed, but only abnormal results are displayed)  Labs Reviewed  BASIC METABOLIC PANEL - Abnormal; Notable for the following components:      Result Value   Potassium 5.3 (*)    Chloride 113 (*)    CO2 16 (*)    Glucose, Bld 124 (*)    BUN 46 (*)    Creatinine, Ser 5.80 (*)    Calcium 7.8 (*)    GFR calc non Af Amer 8 (*)    GFR calc Af Amer 10 (*)    All other components within normal limits  CBC - Abnormal; Notable for the following components:   RBC 2.26 (*)    Hemoglobin 6.8 (*)    HCT 21.1 (*)    Platelets 145 (*)    All other components within normal limits  TROPONIN I (HIGH SENSITIVITY) - Abnormal; Notable for the following components:   Troponin I (High Sensitivity) 229 (*)    All other components within normal limits  SARS CORONAVIRUS 2 (TAT 6-24 HRS)  POC URINE PREG, ED  TROPONIN I (HIGH SENSITIVITY)   ____________________________________________  EKG  ED ECG REPORT I, Rudene Re, the attending physician, personally viewed and interpreted this ECG.  Normal sinus rhythm, rate of 98, normal intervals, normal axis, low voltage QRS, anterior Q waves, T wave inversion in 1 and aVL.  Unchanged from prior  ____________________________________________  RADIOLOGY  I have personally reviewed the images performed during this visit and I agree with the Radiologist's read.   Interpretation by Radiologist:  Dg Chest 2 View  Result Date: 08/20/2019 CLINICAL DATA:  Shortness of breath EXAM: CHEST - 2 VIEW COMPARISON:  11/23/2018 FINDINGS: Cardiac shadow is stable. Lungs are well aerated bilaterally. Patchy bibasilar opacities are seen new from the prior exam. No bony abnormality is noted. IMPRESSION: Bibasilar infiltrates. Electronically Signed   By: Inez Catalina M.D.   On: 08/20/2019 01:02     ____________________________________________   PROCEDURES  Procedure(s) performed: None Procedures Critical Care performed: yes  CRITICAL CARE Performed by: Rudene Re  ?  Total critical care time: 40 min  Critical care time was exclusive of separately billable procedures and treating other patients.  Critical care was necessary to treat or prevent imminent or life-threatening deterioration.  Critical care was time spent personally by me on the following activities: development of treatment plan with patient and/or surrogate as well as nursing, discussions with consultants, evaluation of patient's response to treatment, examination of patient, obtaining history from patient or surrogate, ordering and performing treatments and interventions, ordering and review of laboratory studies, ordering and review of radiographic studies, pulse oximetry and re-evaluation of patient's condition.  ____________________________________________   INITIAL IMPRESSION / ASSESSMENT AND PLAN / ED COURSE  38 y.o. female the history of bilateral BKA's, chronic kidney disease stage V, schizophrenia, hypertension, hyperlipidemia, diabetes who presents for evaluation of cough and shortness of breath worse with movement and laying down.  Ddx pulmonary edema, PNA, COVID, ACS, PE  Patient is chronically  ill-appearing, normal work of breathing, normal sats, lungs are clear to  auscultation.  EKG with no evidence of ischemia or dysrhythmias.  Chest x-ray concerning for pneumonia.  With recent hospitalization 2 months ago would cover for H CAP with cefepime and Vanco.  Will swab patient for Covid.  Patient has mild hyperkalemia with no EKG changes.  Progression of her kidney disease.  I discussed with the patient importance of considering dialysis and patient is in agreement to pursue further treatment for her kidney disease.  She has anemia of chronic disease however that is stable.  She has an elevated troponin most likely from demand ischemia in the setting of pneumonia.  She has no chest pain and nonischemic EKG.  At this time she is on room air and doing well.  We will treat her hyperkalemia with Lasix, fluids, D50, insulin and bicarb.  With no EKG changes we will hold off on calcium.  Discussed with Dr. Sidney Ace for admission.       As part of my medical decision making, I reviewed the following data within the [facility] notes reviewed and incorporated, Labs reviewed , EKG interpreted , Old EKG reviewed, Old chart reviewed, Radiograph reviewed , Discussed with admitting physician , Notes from prior ED visits and [place] Controlled Substance Database   Patient was evaluated in Emergency Department today for the symptoms described in the history of present illness. Patient was evaluated in the context of the global COVID-19 pandemic, which necessitated consideration that the patient might be at risk for infection with the SARS-CoV-2 virus that causes COVID-19. Institutional protocols and algorithms that pertain to the evaluation of patients at risk for COVID-19 are in a state of rapid change based on information released by regulatory bodies including the CDC and federal and state organizations. These policies and algorithms were followed during the patient's care in the ED.    ____________________________________________   FINAL CLINICAL IMPRESSION(S) / ED DIAGNOSES   Final diagnoses:  Healthcare-associated pneumonia  Hyperkalemia  Demand ischemia ([place])  Acute kidney injury superimposed on chronic kidney disease ([place])  Anemia of chronic disease      NEW MEDICATIONS STARTED DURING THIS VISIT:  ED Discharge Orders    None       Note:  This document was prepared using Dragon voice recognition software and may include unintentional dictation errors.    Rudene Re, MD 08/20/19 [phone]

## 2019-08-20 NOTE — ED Notes (Signed)
Admitting MD aware of patient's desire to leave if she cannot smoke and possibly follow-up with outpatient nephrology. MD states she does not set up outpatient appointments and is not in charge of hospital policy against smoking on campus.

## 2019-08-20 NOTE — ED Notes (Signed)
Pt states she has no chest pain, but is sometimes SOB and has dry cough. Pt has generalized red bumps. Denies itching.

## 2019-08-20 NOTE — ED Notes (Signed)
Patient stating the doctor came to talk to her and she is going to just leave AMA and follow-up with outpatient dialysis. Patient reports she understands risk of leaving and will come back if she begins to feel worse. Patient's IV removed and given resources on low cost healthcare options. Patient to lobby in wheel chair.

## 2019-08-20 NOTE — ED Notes (Signed)
Patient states she wants to go outside to smoke a cigarette. This RN informed patient that she is not allowed to leave to smoke. Offered patient nicoderm patch which she refused stating "they dont work. It'll only take 5 minutes. This RN again stated that patient was not allowed to leave or she would be discharged as AMA. Patient wanting to speak with admitting doctor. MD notified by this RN.

## 2019-08-20 NOTE — H&P (Addendum)
[facility] NAME: Kathy Phelps    MR#:  [id number]  DATE OF BIRTH:  [birth year]  DATE OF ADMISSION:  08/20/2019  PRIMARY CARE PHYSICIAN: Davonna Belling, MD   REQUESTING/REFERRING PHYSICIAN: Gonzella Lex, MD  CHIEF COMPLAINT:   Chief Complaint  Patient presents with  . Cough  . Shortness of Breath    HISTORY OF PRESENT ILLNESS:  Kathy Phelps  is a 38 y.o. pleasant Caucasian female with a known history of asthma, bipolar disorder, and anxiety, depression, type 2 diabetes mellitus, dyslipidemia and chronic kidney disease, presented to the emergency room with acute onset of dyspnea with associated congestive cough with inability to expectorate as well as wheezing which have been worsening over the last couple of days.  She admitted to headache and dizziness but denied any nausea or vomiting or abdominal pain.  No chest pain or palpitations.  No reported fever or chills at home.  She has no urine output.  She has recently refused hemodialysis though after explanation of benefits and risks with the ER physician she is agreeable to go for it.  Upon presentation to the emergency room, blood pressure was 183/93 and later 172/103 with a pulse of 1 1 and otherwise normal vital signs.  Labs revealed potassium 5.3 with BUN of 46 and creatinine 5.8 compared to 43 and 5.13 with high-sensitivity troponin I of 229 and anemia of 6.8 with hematocrit 21.1 compared to 6.4 and 20.1 on 06/16/2019  The patient was given broad-spectrum antibiotic therapy with IV vancomycin cefepime and Flagyl given hospitalization over the last 90 days, as well as 20 mg of IV Lasix 10 units of IV NovoLog and an amp of sodium bicarbonate with 1 L normal saline bolus.  She will be admitted to a medical monitored bed for further evaluation and management. PAST MEDICAL HISTORY:   Past Medical History:  Diagnosis Date  . Anxiety   . Asthma   . Bipolar affective ([place])   . Complete  miscarriage   . Constipation   . 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 Depression   . Diabetes mellitus    Type II  . Dyspnea     " only at night and I stop breathing in my sleep too for about the last three weeks"  . GERD (gastroesophageal reflux disease)   . Headache    "migraraines"  . Hyperlipidemia   . Hypertension   . Osteomyelitis ([place])    left foot  . Pregnancy complication    HELP Syndrome  . Renal disorder   . Schizophrenia ([place])   . Stroke [facility]    " mild stroke", memory loss- approx 2017    PAST SURGICAL HISTORY:   Past Surgical History:  Procedure Laterality Date  . ABDOMINAL AORTOGRAM N/A 09/07/2017   Procedure: ABDOMINAL AORTOGRAM;  Surgeon: Waynetta Sandy, MD;  Location: [place] [facility];  Service: Cardiovascular;  Laterality: N/A;  . ABDOMINAL AORTOGRAM W/LOWER EXTREMITY N/A 02/03/2017   Procedure: Abdominal Aortogram w/Lower Extremity;  Surgeon: Waynetta Sandy, MD;  Location: [place] [facility];  Service: Cardiovascular;  Laterality: N/A;  . ABDOMINAL AORTOGRAM W/LOWER EXTREMITY N/A 06/27/2018   Procedure: ABDOMINAL AORTOGRAM W/LOWER EXTREMITY;  Surgeon: Serafina Mitchell, MD;  Location: [place] [facility];  Service: Cardiovascular;  Laterality: N/A;  . AMPUTATION Left 02/10/2017   Procedure: AMPUTATION TOES 3, 4 AND 5  LEFT FOOT;  Surgeon: Waynetta Sandy, MD;  Location: [facility];  Service: Vascular;  Laterality: Left;  . AMPUTATION Left 12/21/2017   Procedure: LEFT FOOT 5TH RAY AMPUTATION;  Surgeon: Newt Minion, MD;  Location: [place];  Service: Orthopedics;  Laterality: Left;  . AMPUTATION Left 02/01/2018   Procedure: LEFT BELOW KNEE AMPUTATION;  Surgeon: Newt Minion, MD;  Location: [facility];  Service: Orthopedics;  Laterality: Left;  . AMPUTATION Right 08/11/2018   Procedure: AMPUTATION BELOW KNEE;  Surgeon: Newt Minion, MD;  Location: [place];  Service: Orthopedics;  Laterality: Right;  . CESAREAN SECTION     . CHOLECYSTECTOMY    . LOWER EXTREMITY ANGIOGRAPHY Bilateral 09/07/2017   Procedure: Lower Extremity Angiography;  Surgeon: Waynetta Sandy, MD;  Location: [place] [facility];  Service: Cardiovascular;  Laterality: Bilateral;  . PERIPHERAL VASCULAR ATHERECTOMY  02/03/2017   Procedure: Peripheral Vascular Atherectomy;  Surgeon: Waynetta Sandy, MD;  Location: [place] [facility];  Service: Cardiovascular;;  . PERIPHERAL VASCULAR ATHERECTOMY Right 06/27/2018   Procedure: PERIPHERAL VASCULAR ATHERECTOMY;  Surgeon: Serafina Mitchell, MD;  Location: [facility] [facility];  Service: Cardiovascular;  Laterality: Right;  superficial femoral  . PERIPHERAL VASCULAR BALLOON ANGIOPLASTY  02/03/2017   Procedure: Peripheral Vascular Balloon Angioplasty;  Surgeon: Waynetta Sandy, MD;  Location: [place] [facility];  Service: Cardiovascular;;  . PERIPHERAL VASCULAR INTERVENTION Left 09/07/2017   Procedure: PERIPHERAL VASCULAR INTERVENTION;  Surgeon: Waynetta Sandy, MD;  Location: [place] [facility];  Service: Cardiovascular;  Laterality: Left;  SFA/POPLITEAL  . STUMP REVISION Left 05/24/2018  . STUMP REVISION Left 05/24/2018   Procedure: REVISION LEFT BELOW KNEE AMPUTATION;  Surgeon: Newt Minion, MD;  Location: [place];  Service: Orthopedics;  Laterality: Left;  . TUBAL LIGATION      SOCIAL HISTORY:   Social History   Tobacco Use  . Smoking status: Current Every Day Smoker    Packs/day: 1.00    Years: 28.00    Pack years: 28.00    Types: Cigarettes  . Smokeless tobacco: Never Used  Substance Use Topics  . Alcohol use: No    FAMILY HISTORY:   Family History  Problem Relation Age of Onset  . Diabetes Mellitus II Mother   . Heart disease Mother   . Heart disease Father     DRUG ALLERGIES:   Allergies  Allergen Reactions  . Hydrocodone-Acetaminophen Hives  . Tylenol [Acetaminophen] Itching and Swelling    Patient tolerated APAP 650 mg (06/29/18) as well as  oxycodone/APAP 5-325 mg during 06/25/2018 admission ??    REVIEW OF SYSTEMS:   ROS As per history of present illness. All pertinent systems were reviewed above. Constitutional,  HEENT, cardiovascular, respiratory, GI, GU, musculoskeletal, neuro, psychiatric, endocrine,  integumentary and hematologic systems were reviewed and are otherwise  negative/unremarkable except for positive findings mentioned above in the HPI.   MEDICATIONS AT HOME:   Prior to Admission medications   Medication Sig Start Date End Date Taking? Authorizing Provider  ADVAIR DISKUS 100-50 MCG/DOSE AEPB Inhale 1 puff into the lungs daily. 01/10/15   [provider]  ALPRAZolam Duanne Moron) 1 MG tablet Take 1 mg by mouth 3 (three) times daily as needed for anxiety.  06/07/19   [provider]  alprazolam Duanne Moron) 2 MG tablet Take 1 tablet (2 mg total) by mouth 4 (four) times daily as needed for sleep. Patient not taking: Reported on 06/15/2019 08/12/17   Debbe Odea, MD  amLODipine (NORVASC) 5 MG tablet Take 5 mg by  mouth daily. 06/13/19   [provider]  atorvastatin (LIPITOR) 80 MG tablet Take 1 tablet (80 mg total) by mouth daily at 6 PM. Patient not taking: Reported on 06/15/2019 07/03/18   [name], MD  cloNIDine (CATAPRES - DOSED IN MG/24 HR) 0.1 mg/24hr patch 0.1 mg once a week. 07/13/19   [provider]  cloNIDine (CATAPRES) 0.1 MG tablet Take 0.1 mg by mouth 2 (two) times daily. 05/12/18   [provider]  DULoxetine (CYMBALTA) 30 MG capsule Take 90 mg by mouth every morning.  06/09/18   [provider]  gabapentin (NEURONTIN) 800 MG tablet Take 1 tablet (800 mg total) by mouth 2 (two) times daily. Patient taking differently: Take 800 mg by mouth 4 (four) times daily.  02/11/17   Hosie Poisson, MD  insulin glargine (LANTUS) 100 UNIT/ML injection Inject 0.15 mLs (15 Units total) into the skin at bedtime. 20 U in the morning and 30 U in the evening Patient not taking:  Reported on 06/15/2019 08/15/18   Bonnielee Haff, MD  Insulin Syringe-Needle U-100 (INSULIN SYRINGE .5CC/31GX5/16") 31G X 5/16" 0.5 ML MISC 1 each by Does not apply route QID. 08/12/17   Gherghe, Vella Redhead, MD  LATUDA 20 MG TABS tablet Take 20 mg by mouth daily.  06/07/19   [provider]  LATUDA 40 MG TABS tablet Take 40 mg by mouth at bedtime. 07/05/19   [provider]  mirtazapine (REMERON) 15 MG tablet Take 15 mg by mouth at bedtime. 12/22/17   [provider]  NOVOLOG 100 UNIT/ML injection Inject 0-30 Units into the skin 3 (three) times daily with meals. Sliding Scale:  >300 15 units < 300 do not use Patient not taking: Reported on 06/15/2019 08/12/17   Caren Griffins, MD  oxyCODONE (OXY IR/ROXICODONE) 5 MG immediate release tablet Take 5 mg by mouth 4 (four) times daily. 06/13/19   [provider]  pantoprazole (PROTONIX) 40 MG tablet Take 1 tablet (40 mg total) by mouth daily. Patient not taking: Reported on 06/15/2019 02/11/17   Hosie Poisson, MD  pentoxifylline (TRENTAL) 400 MG CR tablet Take 1 tablet (400 mg total) by mouth 3 (three) times daily with meals. Patient not taking: Reported on 06/15/2019 01/16/18   Newt Minion, MD  PROAIR HFA 108 [phone] BASE) MCG/ACT inhaler Inhale 2 puffs into the lungs every 6 (six) hours as needed for shortness of breath.  01/10/15   [provider]  sodium bicarbonate 650 MG tablet Take 1,300 mg by mouth 2 (two) times daily. 07/12/19   [provider]  Vitamin D, Ergocalciferol, (DRISDOL) 1.25 MG (50000 UT) CAPS capsule Take 50,000 Units by mouth once a week. 07/13/19   [provider]      VITAL SIGNS:  Blood pressure (!) 172/103, pulse 100, temperature 98.6 F (37 C), temperature source Oral, resp. rate 18, weight 74.8 kg, last menstrual period 08/06/2019, SpO2 100 %.  PHYSICAL EXAMINATION:  Physical Exam  GENERAL:  38 y.o.-year-old pleasant Caucasian female patient lying in the bed in minimal  respiratory distress with conversational dyspnea.   EYES: Pupils equal, round, reactive to light and accommodation. No scleral icterus. Extraocular muscles intact.  HEENT: Head atraumatic, normocephalic. Oropharynx and nasopharynx clear.  NECK:  Supple, no jugular venous distention. No thyroid enlargement, no tenderness.  LUNGS: Diminished bibasilar breath sounds with bibasal crackles. CARDIOVASCULAR: Regular rate and rhythm, S1, S2 normal. No murmurs, rubs, or gallops.  ABDOMEN: Soft, nondistended, nontender. Bowel sounds present. No  organomegaly or mass.  EXTREMITIES: She is status post bilateral below-knee amputations with intact stumps. NEUROLOGIC: Cranial nerves II through XII are intact. Muscle strength 5/5 in all extremities. Sensation intact. Gait not checked.  PSYCHIATRIC: The patient is alert and oriented x 3.  Normal affect and good eye contact. SKIN: No obvious rash, lesion, or ulcer.   LABORATORY PANEL:   CBC Recent Labs  Lab 08/20/19 0036  WBC 9.8  HGB 6.8*  HCT 21.1*  PLT 145*   ------------------------------------------------------------------------------------------------------------------  Chemistries  Recent Labs  Lab 08/20/19 0036  NA 137  K 5.3*  CL 113*  CO2 16*  GLUCOSE 124*  BUN 46*  CREATININE 5.80*  CALCIUM 7.8*   ------------------------------------------------------------------------------------------------------------------  Cardiac Enzymes No results for input(s): TROPONINI in the last 168 hours. ------------------------------------------------------------------------------------------------------------------  RADIOLOGY:  Dg Chest 2 View  Result Date: 08/20/2019 CLINICAL DATA:  Shortness of breath EXAM: CHEST - 2 VIEW COMPARISON:  11/23/2018 FINDINGS: Cardiac shadow is stable. Lungs are well aerated bilaterally. Patchy bibasilar opacities are seen new from the prior exam. No bony abnormality is noted. IMPRESSION: Bibasilar infiltrates.  Electronically Signed   By: Inez Catalina M.D.   On: 08/20/2019 01:02      IMPRESSION AND PLAN:   1.  Bibasal pneumonia likely community-acquired.  The patient has been recently hospitalized within the last 90 days and therefore was started on broad-spectrum IV antibiotics including vancomycin cefepime and Flagyl. I do not really suspect healthcare associated pneumonia and therefore will place on IV Rocephin and Zithromax.  Mucolytic therapy will be provided as well as scheduled and as needed DuoNeb's.  2.  Elevated troponin I.  This could be related to #1 leading to demand ischemia.  End-stage renal disease could be contributing as well.  The patient will be followed by serial troponin I's.  It could also be related to hypertensive urgency.  We will check a 2D echo.  3.  Hypertensive urgency.  Patient will be placed on as needed IV labetalol and will continue her antihypertensives.  4.  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  Nephrology consultation will be obtained by Dr. Holley Raring.  I notified Dr. Holley Raring about the patient.  5.  Type 2 diabetes mellitus.  The patient will be placed on supplemental coverage with Humalog we will continue her basal coverage.  6.  Anxiety.  We will continue Xanax.  7.  Peripheral neuropathy.  We will continue Neurontin.  8.  Bipolar disorder.  We will continue Latuda.  9.  DVT prophylaxis.  Subcutaneous Lovenox..  All the records are reviewed and case discussed with ED provider. The plan of care was discussed in details with the patient (and family). I answered all questions. The patient agreed to proceed with the above mentioned plan. Further management will depend upon hospital course.   CODE STATUS: Full code  TOTAL TIME TAKING CARE OF THIS PATIENT:55 minutes.    Christel Mormon M.D on 08/20/2019 at 3:02 AM  [facility]   From 7 PM-7 AM, contact night-coverage [email or web address]  CC: Primary care  physician; Davonna Belling, MD   Note: This dictation was prepared with Dragon dictation along with smaller phrase technology. Any transcriptional errors that result from this process are unintentional.

## 2019-08-20 NOTE — Progress Notes (Signed)
*  PRELIMINARY RESULTS* Echocardiogram 2D Echocardiogram has been performed.  Sherrie Sport 08/20/2019, 1:17 PM

## 2019-08-20 NOTE — ED Triage Notes (Signed)
Patient with complaint of shortness of breath that started yesterday. Patient states that she developed a cough today.

## 2019-08-21 NOTE — Discharge Summary (Addendum)
Physician Discharge Summary  Kathy Phelps [id number] DOB: 1981-09-22 DOA: 08/20/2019  PCP: Kathy Belling, MD  Admit date: 08/20/2019 Discharge date: 08/21/2019  Recommendations for Outpatient Follow-up:  1. AMA  Discharge Diagnoses: Principal diagnosis is #1 1. AMA  Discharge Condition: AMA  Disposition: AMA  Diet recommendation: AMA  Filed Weights   08/20/19 0029  Weight: 74.8 kg    History of present illness:  Kathy Phelps  is a 38 y.o. pleasant Caucasian female with a known history of asthma, bipolar disorder, and anxiety, depression, type 2 diabetes mellitus, dyslipidemia and chronic kidney disease, presented to the emergency room with acute onset of dyspnea with associated congestive cough with inability to expectorate as well as wheezing which have been worsening over the last couple of days.  She admitted to headache and dizziness but denied any nausea or vomiting or abdominal pain.  No chest pain or palpitations.  No reported fever or chills at home.  She has no urine output.  She has recently refused hemodialysis though after explanation of benefits and risks with the ER physician she is agreeable to go for it.  Upon presentation to the emergency room, blood pressure was 183/93 and later 172/103 with a pulse of 1 1 and otherwise normal vital signs.  Labs revealed potassium 5.3 with BUN of 46 and creatinine 5.8 compared to 43 and 5.13 with high-sensitivity troponin I of 229 and anemia of 6.8 with hematocrit 21.1 compared to 6.4 and 20.1 on 06/16/2019  The patient was given broad-spectrum antibiotic therapy with IV vancomycin cefepime and Flagyl given hospitalization over the last 90 days, as well as 20 mg of IV Lasix 10 units of IV NovoLog and an amp of sodium bicarbonate with 1 L normal saline bolus.  She will be admitted to a medical monitored bed for further evaluation and management.  Hospital Course:  The patient was roomed in the ED awaiting a telemetry bed  upstairs. She had received IV antibiotics. The patient became upset that she would not be able to go outside to smoke and return to the ED. She stated that she wanted to leave AMA. I saw the patient and explained to her that she needed to start dialysis and that nephrology had been consulted. I explained that if she left without completing treatment for her pneumonia she could become much worse. She just wanted to be set up for outpatient dialysis, but she did not even have a temporary catheter. She was told that if she left, her kidneys might get worse.  She stated that she wanted to leave anyway.  Today's assessment: S: The patient is resting comfortably. O: Vitals:  Vitals:   08/20/19 1130 08/20/19 1200  BP: (!) 139/92 (!) 135/92  Pulse:    Resp: 15 (!) 25  Temp:    SpO2:     Please seen History and Physical dated 08/20/2019 for physical exam on the day patient left AMA.  Discharge Instructions   Allergies as of 08/20/2019      Reactions   Hydrocodone-acetaminophen Hives   Tylenol [acetaminophen] Itching, Swelling   Patient tolerated APAP 650 mg (06/29/18) as well as oxycodone/APAP 5-325 mg during 06/25/2018 admission ??      Medication List    ASK your doctor about these medications   ALPRAZolam 1 MG tablet Commonly known as: XANAX Take 1 mg by mouth 3 (three) times daily as needed for anxiety. Ask about: Which instructions should I use?   amLODipine 5 MG tablet Commonly known  as: NORVASC Take 5 mg by mouth daily.   cloNIDine 0.1 mg/24hr patch Commonly known as: CATAPRES - Dosed in mg/24 hr 0.1 mg once a week.   gabapentin 800 MG tablet Commonly known as: NEURONTIN Take 1 tablet (800 mg total) by mouth 2 (two) times daily.   insulin glargine 100 UNIT/ML injection Commonly known as: Lantus Inject 0.15 mLs (15 Units total) into the skin at bedtime. 20 U in the morning and 30 U in the evening   INSULIN SYRINGE .5CC/31GX5/16" 31G X 5/16" 0.5 ML Misc 1 each by Does not  apply route QID.   Latuda 40 MG Tabs tablet Generic drug: lurasidone Take 40 mg by mouth at bedtime. Ask about: Which instructions should I use?   NovoLOG 100 UNIT/ML injection Generic drug: insulin aspart Inject 0-30 Units into the skin 3 (three) times daily with meals. Sliding Scale:  >300 15 units < 300 do not use   oxyCODONE 5 MG immediate release tablet Commonly known as: Oxy IR/ROXICODONE Take 5 mg by mouth 4 (four) times daily.   ProAir HFA 108 (90 Base) MCG/ACT inhaler Generic drug: albuterol Inhale 2 puffs into the lungs every 6 (six) hours as needed for shortness of breath.   sodium bicarbonate 650 MG tablet Take 1,300 mg by mouth 2 (two) times daily.   Vitamin D (Ergocalciferol) 1.25 MG (50000 UT) Caps capsule Commonly known as: DRISDOL Take 50,000 Units by mouth once a week.      Allergies  Allergen Reactions  . Hydrocodone-Acetaminophen Hives  . Tylenol [Acetaminophen] Itching and Swelling    Patient tolerated APAP 650 mg (06/29/18) as well as oxycodone/APAP 5-325 mg during 06/25/2018 admission ??    The results of significant diagnostics from this hospitalization (including imaging, microbiology, ancillary and laboratory) are listed below for reference.    Significant Diagnostic Studies: Dg Chest 2 View  Result Date: 08/20/2019 CLINICAL DATA:  Shortness of breath EXAM: CHEST - 2 VIEW COMPARISON:  11/23/2018 FINDINGS: Cardiac shadow is stable. Lungs are well aerated bilaterally. Patchy bibasilar opacities are seen new from the prior exam. No bony abnormality is noted. IMPRESSION: Bibasilar infiltrates. Electronically Signed   By: Inez Catalina M.D.   On: 08/20/2019 01:02    Microbiology: Recent Results (from the past 240 hour(s))  SARS CORONAVIRUS 2 (TAT 6-24 HRS) Nasopharyngeal Nasopharyngeal Swab     Status: None   Collection Time: 08/20/19  2:43 AM   Specimen: Nasopharyngeal Swab  Result Value Ref Range Status   SARS Coronavirus 2 NEGATIVE NEGATIVE Final     Comment: (NOTE) SARS-CoV-2 target nucleic acids are NOT DETECTED. The SARS-CoV-2 RNA is generally detectable in upper and lower respiratory specimens during the acute phase of infection. Negative results do not preclude SARS-CoV-2 infection, do not rule out co-infections with other pathogens, and should not be used as the sole basis for treatment or other patient management decisions. Negative results must be combined with clinical observations, patient history, and epidemiological information. The expected result is Negative. Fact Sheet for Patients: SugarRoll.be Fact Sheet for Healthcare Providers: https://www.woods-mathews.com/ This test is not yet approved or cleared by the [place] FDA and  has been authorized for detection and/or diagnosis of SARS-CoV-2 by FDA under an Emergency Use Authorization (EUA). This EUA will remain  in effect (meaning this test can be used) for the duration of the COVID-19 declaration under Section 56 4(b)(1) of the Act, 21 U.S.C. section 360bbb-3(b)(1), unless the authorization is terminated or revoked sooner. Performed at [facility]  Hospital Lab, [place] [address], [place], [place] [address]   Culture, blood (routine x 2) Call MD if unable to obtain prior to antibiotics being given     Status: None (Preliminary result)   Collection Time: 08/20/19  5:44 AM   Specimen: BLOOD  Result Value Ref Range Status   Specimen Description BLOOD RIGHT [facility]  Final   Special Requests   Final    BOTTLES DRAWN AEROBIC AND ANAEROBIC Blood Culture adequate volume   Culture   Final    NO GROWTH 1 DAY Performed at [facility], [address], [place], [place] [address]    Report Status PENDING  Incomplete  Culture, blood (routine x 2) Call MD if unable to obtain prior to antibiotics being given     Status: None (Preliminary result)   Collection Time: 08/20/19  5:44 AM   Specimen: BLOOD  Result Value Ref Range Status    Specimen Description BLOOD RIGHT FA  Final   Special Requests   Final    BOTTLES DRAWN AEROBIC AND ANAEROBIC Blood Culture adequate volume   Culture   Final    NO GROWTH 1 DAY Performed at [facility], [address] [place], [place] [address]    Report Status PENDING  Incomplete     Labs: Basic Metabolic Panel: Recent Labs  Lab 08/20/19 0036  NA 137  K 5.3*  CL 113*  CO2 16*  GLUCOSE 124*  BUN 46*  CREATININE 5.80*  CALCIUM 7.8*   Liver Function Tests: No results for input(s): AST, ALT, ALKPHOS, BILITOT, PROT, ALBUMIN in the last 168 hours. No results for input(s): LIPASE, AMYLASE in the last 168 hours. No results for input(s): AMMONIA in the last 168 hours. CBC: Recent Labs  Lab 08/20/19 0036  WBC 9.8  HGB 6.8*  HCT 21.1*  MCV 93.4  PLT 145*   Cardiac Enzymes: No results for input(s): CKTOTAL, CKMB, CKMBINDEX, TROPONINI in the last 168 hours. BNP: BNP (last 3 results) No results for input(s): BNP in the last 8760 hours.  ProBNP (last 3 results) No results for input(s): PROBNP in the last 8760 hours.  CBG: Recent Labs  Lab 08/20/19 1002 08/20/19 1238  GLUCAP 121* 77    Active Problems:   Pneumonia   Time coordinating discharge: 38 minutes  Signed:        Josaphine Shimamoto, DO [facility]  08/21/2019, 8:50 PM

## 2019-08-22 ENCOUNTER — Emergency Department (HOSPITAL_COMMUNITY)
Admission: EM | Admit: 2019-08-22 | Discharge: 2019-08-23 | Discharge status: Against Medical Advice | Payer: Medicare Other | Attending: Emergency Medicine | Admitting: Emergency Medicine

## 2019-08-22 ENCOUNTER — Other Ambulatory Visit: Payer: Self-pay

## 2019-08-22 ENCOUNTER — Encounter (HOSPITAL_COMMUNITY): Payer: Self-pay

## 2019-08-22 ENCOUNTER — Emergency Department (HOSPITAL_COMMUNITY): Payer: Medicare Other

## 2019-08-22 DIAGNOSIS — F419 Anxiety disorder, unspecified: Secondary | ICD-10-CM | POA: Insufficient documentation

## 2019-08-22 DIAGNOSIS — Z5321 Procedure and treatment not carried out due to patient leaving prior to being seen by health care provider: Secondary | ICD-10-CM | POA: Insufficient documentation

## 2019-08-22 DIAGNOSIS — R0789 Other chest pain: Secondary | ICD-10-CM | POA: Diagnosis present

## 2019-08-22 LAB — CBC
HCT: 21.2 % — ABNORMAL LOW (ref 36.0–46.0)
Hemoglobin: 6.6 g/dL — CL (ref 12.0–15.0)
MCH: 31.1 pg (ref 26.0–34.0)
MCHC: 31.1 g/dL (ref 30.0–36.0)
MCV: 100 fL (ref 80.0–100.0)
Platelets: 135 10*3/uL — ABNORMAL LOW (ref 150–400)
RBC: 2.12 MIL/uL — ABNORMAL LOW (ref 3.87–5.11)
RDW: 13.8 % (ref 11.5–15.5)
WBC: 8.5 10*3/uL (ref 4.0–10.5)
nRBC: 0 % (ref 0.0–0.2)

## 2019-08-22 LAB — I-STAT BETA HCG BLOOD, ED (MC, WL, AP ONLY): I-stat hCG, quantitative: <5 m[IU]/mL (ref <5)

## 2019-08-22 MED ORDER — SODIUM CHLORIDE 0.9% FLUSH: 3.0000 mL | Freq: Once | INTRAVENOUS | Status: DC

## 2019-08-22 NOTE — ED Notes (Signed)
Pt asked me to assist her to a Kathy Phelps to leave and to take her iv out. Iv was removed and the pt was assisted into [place] by family

## 2019-08-22 NOTE — ED Notes (Addendum)
Called pt per Dr. Dolly Rias to notify her of her Hgb of 6.6. No one answered. Left message informing the pt that she had an abnormal lab value and we wanted to make sure she knew.  Left number to call back.

## 2019-08-22 NOTE — ED Triage Notes (Signed)
Per ems, pt from home. Pt tonight reports anxiety and cp. Pt was altered tonight during argument with daughter. Combative with ems. Daughter reports pt gets like this when she doesn't take her medications. Pt received 5mg  of IM Haldol by ems. After haldol, pt compliant and oriented. Hx of schizophrenia, not compliant with medications. Pt was seen and diagnosed with pneumonia recently and did not receive antibiotics, left AMA.   20g by ems in left hand

## 2019-08-22 NOTE — ED Provider Notes (Signed)
Patient apparently had anxiety attack and left without being seen.  I was approached by nursing to inform her she had a hemoglobin of 6.6. Her vital signs appear stable aside from heart rate of 102.  It does appear that she has been below 7 on multiple hemoglobin checks in the past and generally trends to be in the 7-8 range. I informed them that the patient should be notified of her results.  This does not look like it would be something that she would get transfused for as it seems to be chronic in nature (however I can't be certain without H&P) but she needs to be informed none the less so she can come back for evaluation if she feels symptomatic.   Labs Reviewed  CBC - Abnormal; Notable for the following components:      Result Value   RBC 2.12 (*)    Hemoglobin 6.6 (*)    HCT 21.2 (*)    Platelets 135 (*)    All other components within normal limits  BASIC METABOLIC PANEL  I-STAT BETA HCG BLOOD, ED ([facility], [facility], [facility] ONLY)  TROPONIN I (HIGH SENSITIVITY)      Isabellamarie Randa, Corene Cornea, MD 08/22/19 2337

## 2019-08-22 NOTE — ED Triage Notes (Signed)
Pt reports chest pain that started around 7pm. Denies pain at this time.

## 2019-08-22 NOTE — ED Notes (Signed)
[phone] Judson Roch pts niece, checking on her

## 2019-08-23 LAB — BASIC METABOLIC PANEL
Anion gap: 10 (ref 5–15)
BUN: 42 mg/dL — ABNORMAL HIGH (ref 6–20)
CO2: 14 mmol/L — ABNORMAL LOW (ref 22–32)
Calcium: 7.6 mg/dL — ABNORMAL LOW (ref 8.9–10.3)
Chloride: 115 mmol/L — ABNORMAL HIGH (ref 98–111)
Creatinine, Ser: 6.21 mg/dL — ABNORMAL HIGH (ref 0.44–1.00)
GFR calc Af Amer: 9 mL/min — ABNORMAL LOW (ref >60)
GFR calc non Af Amer: 8 mL/min — ABNORMAL LOW (ref >60)
Glucose, Bld: 121 mg/dL — ABNORMAL HIGH (ref 70–99)
Potassium: 4.8 mmol/L (ref 3.5–5.1)
Sodium: 139 mmol/L (ref 135–145)

## 2019-08-23 LAB — TROPONIN I (HIGH SENSITIVITY): Troponin I (High Sensitivity): 207 ng/L (ref <18)

## 2019-08-24 ENCOUNTER — Ambulatory Visit: Payer: Medicare Other | Admitting: Vascular Surgery

## 2019-08-24 ENCOUNTER — Inpatient Hospital Stay (HOSPITAL_COMMUNITY): Admission: RE | Admit: 2019-08-24 | Payer: Medicare Other | Source: Ambulatory Visit

## 2019-08-24 ENCOUNTER — Other Ambulatory Visit (HOSPITAL_COMMUNITY): Payer: Medicare Other

## 2019-08-25 LAB — CULTURE, BLOOD (ROUTINE X 2)
Culture: NO GROWTH
Culture: NO GROWTH
Special Requests: ADEQUATE
Special Requests: ADEQUATE

## 2019-08-27 ENCOUNTER — Telehealth: Payer: Self-pay

## 2019-08-27 NOTE — Telephone Encounter (Signed)
Kathy Phelps called [facility] and spoke with a scheduler to reschedule the appointment that she missed on Friday, 11.13.20  She wished to be seen before the first available appointment of December 18. She said that she felt very bad and needed to be seen emergently.  I suggested that she contact her renal doctor for advice in case she needed to go to the hospital for emergency treatment.  She said that she would contact that office.  jennifer [employer]

## 2019-09-28 ENCOUNTER — Ambulatory Visit: Payer: Medicare Other | Admitting: Vascular Surgery

## 2019-09-28 ENCOUNTER — Other Ambulatory Visit (HOSPITAL_COMMUNITY): Payer: Medicare Other

## 2019-09-28 ENCOUNTER — Inpatient Hospital Stay (HOSPITAL_COMMUNITY): Admission: RE | Admit: 2019-09-28 | Payer: Medicare Other | Source: Ambulatory Visit

## 2019-10-31 ENCOUNTER — Encounter: Payer: Self-pay | Admitting: Emergency Medicine

## 2019-10-31 ENCOUNTER — Inpatient Hospital Stay
Admission: EM | Admit: 2019-10-31 | Discharge: 2019-11-08 | DRG: 291 | Discharge status: Against Medical Advice | Payer: Medicare Other | Attending: Internal Medicine | Admitting: Internal Medicine

## 2019-10-31 ENCOUNTER — Emergency Department: Payer: Medicare Other

## 2019-10-31 ENCOUNTER — Other Ambulatory Visit: Payer: Self-pay

## 2019-10-31 DIAGNOSIS — E1122 Type 2 diabetes mellitus with diabetic chronic kidney disease: Secondary | ICD-10-CM | POA: Diagnosis present

## 2019-10-31 DIAGNOSIS — U071 COVID-19: Secondary | ICD-10-CM | POA: Diagnosis present

## 2019-10-31 DIAGNOSIS — E1165 Type 2 diabetes mellitus with hyperglycemia: Secondary | ICD-10-CM | POA: Diagnosis present

## 2019-10-31 DIAGNOSIS — E8770 Fluid overload, unspecified: Secondary | ICD-10-CM

## 2019-10-31 DIAGNOSIS — D631 Anemia in chronic kidney disease: Secondary | ICD-10-CM | POA: Diagnosis present

## 2019-10-31 DIAGNOSIS — E11649 Type 2 diabetes mellitus with hypoglycemia without coma: Secondary | ICD-10-CM | POA: Diagnosis not present

## 2019-10-31 DIAGNOSIS — Z9115 Patient's noncompliance with renal dialysis: Secondary | ICD-10-CM

## 2019-10-31 DIAGNOSIS — J810 Acute pulmonary edema: Secondary | ICD-10-CM | POA: Diagnosis not present

## 2019-10-31 DIAGNOSIS — F319 Bipolar disorder, unspecified: Secondary | ICD-10-CM | POA: Diagnosis present

## 2019-10-31 DIAGNOSIS — F419 Anxiety disorder, unspecified: Secondary | ICD-10-CM | POA: Diagnosis present

## 2019-10-31 DIAGNOSIS — I509 Heart failure, unspecified: Secondary | ICD-10-CM | POA: Diagnosis present

## 2019-10-31 DIAGNOSIS — T8249XA Other complication of vascular dialysis catheter, initial encounter: Secondary | ICD-10-CM | POA: Diagnosis not present

## 2019-10-31 DIAGNOSIS — E1151 Type 2 diabetes mellitus with diabetic peripheral angiopathy without gangrene: Secondary | ICD-10-CM | POA: Diagnosis present

## 2019-10-31 DIAGNOSIS — I70209 Unspecified atherosclerosis of native arteries of extremities, unspecified extremity: Secondary | ICD-10-CM | POA: Diagnosis not present

## 2019-10-31 DIAGNOSIS — Z992 Dependence on renal dialysis: Secondary | ICD-10-CM | POA: Diagnosis not present

## 2019-10-31 DIAGNOSIS — IMO0002 Reserved for concepts with insufficient information to code with codable children: Secondary | ICD-10-CM | POA: Diagnosis present

## 2019-10-31 DIAGNOSIS — Z89519 Acquired absence of unspecified leg below knee: Secondary | ICD-10-CM

## 2019-10-31 DIAGNOSIS — N186 End stage renal disease: Secondary | ICD-10-CM | POA: Diagnosis present

## 2019-10-31 DIAGNOSIS — F209 Schizophrenia, unspecified: Secondary | ICD-10-CM | POA: Diagnosis present

## 2019-10-31 DIAGNOSIS — I272 Pulmonary hypertension, unspecified: Secondary | ICD-10-CM | POA: Diagnosis present

## 2019-10-31 DIAGNOSIS — Z885 Allergy status to narcotic agent status: Secondary | ICD-10-CM

## 2019-10-31 DIAGNOSIS — E8779 Other fluid overload: Secondary | ICD-10-CM | POA: Diagnosis not present

## 2019-10-31 DIAGNOSIS — Z794 Long term (current) use of insulin: Secondary | ICD-10-CM

## 2019-10-31 DIAGNOSIS — Z9049 Acquired absence of other specified parts of digestive tract: Secondary | ICD-10-CM

## 2019-10-31 DIAGNOSIS — Z9119 Patient's noncompliance with other medical treatment and regimen: Secondary | ICD-10-CM

## 2019-10-31 DIAGNOSIS — J811 Chronic pulmonary edema: Secondary | ICD-10-CM

## 2019-10-31 DIAGNOSIS — E1129 Type 2 diabetes mellitus with other diabetic kidney complication: Secondary | ICD-10-CM | POA: Diagnosis present

## 2019-10-31 DIAGNOSIS — I160 Hypertensive urgency: Secondary | ICD-10-CM | POA: Diagnosis present

## 2019-10-31 DIAGNOSIS — D696 Thrombocytopenia, unspecified: Secondary | ICD-10-CM | POA: Diagnosis present

## 2019-10-31 DIAGNOSIS — E785 Hyperlipidemia, unspecified: Secondary | ICD-10-CM | POA: Diagnosis present

## 2019-10-31 DIAGNOSIS — K219 Gastro-esophageal reflux disease without esophagitis: Secondary | ICD-10-CM | POA: Diagnosis present

## 2019-10-31 DIAGNOSIS — L299 Pruritus, unspecified: Secondary | ICD-10-CM | POA: Diagnosis present

## 2019-10-31 DIAGNOSIS — R079 Chest pain, unspecified: Secondary | ICD-10-CM

## 2019-10-31 DIAGNOSIS — Z8249 Family history of ischemic heart disease and other diseases of the circulatory system: Secondary | ICD-10-CM

## 2019-10-31 DIAGNOSIS — F17210 Nicotine dependence, cigarettes, uncomplicated: Secondary | ICD-10-CM | POA: Diagnosis present

## 2019-10-31 DIAGNOSIS — Z9289 Personal history of other medical treatment: Secondary | ICD-10-CM

## 2019-10-31 DIAGNOSIS — I132 Hypertensive heart and chronic kidney disease with heart failure and with stage 5 chronic kidney disease, or end stage renal disease: Principal | ICD-10-CM | POA: Diagnosis present

## 2019-10-31 DIAGNOSIS — N2581 Secondary hyperparathyroidism of renal origin: Secondary | ICD-10-CM | POA: Diagnosis present

## 2019-10-31 DIAGNOSIS — Z886 Allergy status to analgesic agent status: Secondary | ICD-10-CM

## 2019-10-31 DIAGNOSIS — Z89512 Acquired absence of left leg below knee: Secondary | ICD-10-CM

## 2019-10-31 DIAGNOSIS — Z833 Family history of diabetes mellitus: Secondary | ICD-10-CM

## 2019-10-31 DIAGNOSIS — Z79899 Other long term (current) drug therapy: Secondary | ICD-10-CM

## 2019-10-31 DIAGNOSIS — R21 Rash and other nonspecific skin eruption: Secondary | ICD-10-CM

## 2019-10-31 DIAGNOSIS — I10 Essential (primary) hypertension: Secondary | ICD-10-CM | POA: Diagnosis present

## 2019-10-31 DIAGNOSIS — Y838 Other surgical procedures as the cause of abnormal reaction of the patient, or of later complication, without mention of misadventure at the time of the procedure: Secondary | ICD-10-CM | POA: Diagnosis not present

## 2019-10-31 DIAGNOSIS — Z89511 Acquired absence of right leg below knee: Secondary | ICD-10-CM

## 2019-10-31 DIAGNOSIS — L281 Prurigo nodularis: Secondary | ICD-10-CM | POA: Diagnosis present

## 2019-10-31 DIAGNOSIS — Z8673 Personal history of transient ischemic attack (TIA), and cerebral infarction without residual deficits: Secondary | ICD-10-CM | POA: Diagnosis not present

## 2019-10-31 DIAGNOSIS — I5021 Acute systolic (congestive) heart failure: Secondary | ICD-10-CM | POA: Diagnosis present

## 2019-10-31 LAB — CBC WITH DIFFERENTIAL/PLATELET
Abs Immature Granulocytes: 0.01 10*3/uL (ref 0.00–0.07)
Basophils Absolute: 0.1 10*3/uL (ref 0.0–0.1)
Basophils Relative: 1 %
Eosinophils Absolute: 1.5 10*3/uL — ABNORMAL HIGH (ref 0.0–0.5)
Eosinophils Relative: 19 %
HCT: 26.4 % — ABNORMAL LOW (ref 36.0–46.0)
Hemoglobin: 8.3 g/dL — ABNORMAL LOW (ref 12.0–15.0)
Immature Granulocytes: 0 %
Lymphocytes Relative: 15 %
Lymphs Abs: 1.2 10*3/uL (ref 0.7–4.0)
MCH: 29.9 pg (ref 26.0–34.0)
MCHC: 31.4 g/dL (ref 30.0–36.0)
MCV: 95 fL (ref 80.0–100.0)
Monocytes Absolute: 0.4 10*3/uL (ref 0.1–1.0)
Monocytes Relative: 5 %
Neutro Abs: 4.5 10*3/uL (ref 1.7–7.7)
Neutrophils Relative %: 60 %
Platelets: 144 10*3/uL — ABNORMAL LOW (ref 150–400)
RBC: 2.78 MIL/uL — ABNORMAL LOW (ref 3.87–5.11)
RDW: 15.4 % (ref 11.5–15.5)
WBC: 7.6 10*3/uL (ref 4.0–10.5)
nRBC: 0 % (ref 0.0–0.2)

## 2019-10-31 LAB — COMPREHENSIVE METABOLIC PANEL
ALT: 6 U/L (ref 0–44)
AST: 14 U/L — ABNORMAL LOW (ref 15–41)
Albumin: 2.8 g/dL — ABNORMAL LOW (ref 3.5–5.0)
Alkaline Phosphatase: 87 U/L (ref 38–126)
Anion gap: 11 (ref 5–15)
BUN: 62 mg/dL — ABNORMAL HIGH (ref 6–20)
CO2: 14 mmol/L — ABNORMAL LOW (ref 22–32)
Calcium: 8 mg/dL — ABNORMAL LOW (ref 8.9–10.3)
Chloride: 113 mmol/L — ABNORMAL HIGH (ref 98–111)
Creatinine, Ser: 7.63 mg/dL — ABNORMAL HIGH (ref 0.44–1.00)
GFR calc Af Amer: 7 mL/min — ABNORMAL LOW (ref >60)
GFR calc non Af Amer: 6 mL/min — ABNORMAL LOW (ref >60)
Glucose, Bld: 142 mg/dL — ABNORMAL HIGH (ref 70–99)
Potassium: 4.5 mmol/L (ref 3.5–5.1)
Sodium: 138 mmol/L (ref 135–145)
Total Bilirubin: 0.5 mg/dL (ref 0.3–1.2)
Total Protein: 6.3 g/dL — ABNORMAL LOW (ref 6.5–8.1)

## 2019-10-31 LAB — RESPIRATORY PANEL BY RT PCR (FLU A&B, COVID)
Influenza A by PCR: NEGATIVE
Influenza B by PCR: NEGATIVE
SARS Coronavirus 2 by RT PCR: POSITIVE — AB

## 2019-10-31 LAB — BRAIN NATRIURETIC PEPTIDE: B Natriuretic Peptide: 2533 pg/mL — ABNORMAL HIGH (ref 0.0–100.0)

## 2019-10-31 LAB — MAGNESIUM: Magnesium: 2 mg/dL (ref 1.7–2.4)

## 2019-10-31 LAB — PHOSPHORUS: Phosphorus: 6.6 mg/dL — ABNORMAL HIGH (ref 2.5–4.6)

## 2019-10-31 LAB — GLUCOSE, CAPILLARY: Glucose-Capillary: 103 mg/dL — ABNORMAL HIGH (ref 70–99)

## 2019-10-31 MED ORDER — FUROSEMIDE 10 MG/ML IJ SOLN
60.0000 mg | Freq: Once | INTRAMUSCULAR | Status: AC
  Administered 2019-10-31: 60 mg via INTRAVENOUS
  Filled 2019-10-31: qty 8

## 2019-10-31 MED ORDER — SODIUM CHLORIDE 0.9% FLUSH: 3.0000 mL | INTRAVENOUS | Status: DC | PRN

## 2019-10-31 MED ORDER — AMLODIPINE BESYLATE 5 MG PO TABS
5.0000 mg | ORAL_TABLET | Freq: Every day | ORAL | Status: DC
  Administered 2019-10-31 – 2019-11-08 (×7): 5 mg via ORAL
  Filled 2019-10-31 (×8): qty 1

## 2019-10-31 MED ORDER — INSULIN ASPART 100 UNIT/ML ~~LOC~~ SOLN: 0.0000 [IU] | Freq: Three times a day (TID) | SUBCUTANEOUS | Status: DC

## 2019-10-31 MED ORDER — ACETAMINOPHEN 325 MG PO TABS
650.0000 mg | ORAL_TABLET | ORAL | Status: DC | PRN
  Administered 2019-11-02 – 2019-11-05 (×4): 650 mg via ORAL
  Filled 2019-10-31 (×4): qty 2

## 2019-10-31 MED ORDER — HEPARIN SODIUM (PORCINE) 5000 UNIT/ML IJ SOLN
5000.0000 [IU] | Freq: Three times a day (TID) | INTRAMUSCULAR | Status: DC
  Administered 2019-10-31 – 2019-11-08 (×20): 5000 [IU] via SUBCUTANEOUS
  Filled 2019-10-31 (×21): qty 1

## 2019-10-31 MED ORDER — OXYCODONE HCL 5 MG PO TABS
5.0000 mg | ORAL_TABLET | Freq: Four times a day (QID) | ORAL | Status: DC
  Administered 2019-10-31 – 2019-11-02 (×7): 5 mg via ORAL
  Filled 2019-10-31 (×7): qty 1

## 2019-10-31 MED ORDER — OXYCODONE HCL 5 MG PO TABS
5.0000 mg | ORAL_TABLET | Freq: Once | ORAL | Status: AC
  Administered 2019-10-31: 5 mg via ORAL
  Filled 2019-10-31: qty 1

## 2019-10-31 MED ORDER — FUROSEMIDE 10 MG/ML IJ SOLN
80.0000 mg | Freq: Two times a day (BID) | INTRAMUSCULAR | Status: DC
  Administered 2019-11-01 – 2019-11-03 (×4): 80 mg via INTRAVENOUS
  Filled 2019-10-31 (×5): qty 8

## 2019-10-31 MED ORDER — ONDANSETRON HCL 4 MG/2ML IJ SOLN: 4.0000 mg | Freq: Four times a day (QID) | INTRAMUSCULAR | Status: DC | PRN

## 2019-10-31 MED ORDER — INSULIN ASPART 100 UNIT/ML ~~LOC~~ SOLN: 0.0000 [IU] | Freq: Every day | SUBCUTANEOUS | Status: DC

## 2019-10-31 MED ORDER — SODIUM CHLORIDE 0.9 % IV SOLN: 250.0000 mL | INTRAVENOUS | Status: DC | PRN

## 2019-10-31 MED ORDER — SODIUM CHLORIDE 0.9% FLUSH
3.0000 mL | Freq: Two times a day (BID) | INTRAVENOUS | Status: DC
  Administered 2019-10-31 – 2019-11-06 (×12): 3 mL via INTRAVENOUS

## 2019-10-31 NOTE — ED Notes (Signed)
Pt presents today with fluid retention in abdomen and back pt states she stopped dialysis x 2 months ago.

## 2019-10-31 NOTE — H&P (Signed)
History and Physical    Kathy Phelps [id number] DOB: Mar 06, 1981 DOA: 10/31/2019  PCP: Davonna Belling, MD   Patient coming from: home  I have personally briefly reviewed patient's old medical records in [place]  Chief Complaint: abdominal distention  HPI: Kathy Phelps is a 39 y.o. 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  She states that she missed dialysis for the past 2 months because she did not have a ride.  She is short of breath only when she lays flat but feels fine when she is sitting up.  She denies cough, chest pain, fever or chills.  She does complain of a rash on her back and abdomen for the past month that is very pruritic.  ED Course: On arrival in the emergency room, blood pressure was 176/103 with O2 sat 100% on room air and otherwise normal vitals.  She appeared comfortable.  BNP was elevated at 2533.  Potassium 4.5 and bicarb 14.  Creatinine 7.63.  Chest x-ray showed cardiomegaly with vascular congestion and developing pulmonary edema with small to moderate-sized bilateral pleural effusions and also showed a well-positioned tunneled dialysis catheter.  Patient was given IV Lasix in the emergency room and hospitalist consulted for admission.  Review of Systems: As per HPI otherwise 10 point review of systems negative.    Past Medical History:  Diagnosis Date  . Anxiety   . Asthma   . Bipolar affective ([place])   . Complete miscarriage   . Constipation   . 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 Depression   . Diabetes mellitus    Type II  . Dyspnea     " only at night and I stop breathing  in my sleep too for about the last three weeks"  . GERD (gastroesophageal reflux disease)   . Headache    "migraraines"  . Hyperlipidemia   . Hypertension   . Osteomyelitis ([place])    left foot  . Pregnancy complication    HELP Syndrome  . Renal disorder   . Schizophrenia ([place])   . Stroke [facility])    " mild stroke", memory loss- approx 2017    Past Surgical History:  Procedure Laterality Date  . ABDOMINAL AORTOGRAM N/A 09/07/2017   Procedure: ABDOMINAL AORTOGRAM;  Surgeon: Waynetta Sandy, MD;  Location: [place] [facility];  Service: Cardiovascular;  Laterality: N/A;  . ABDOMINAL AORTOGRAM W/LOWER EXTREMITY N/A 02/03/2017   Procedure: Abdominal Aortogram w/Lower Extremity;  Surgeon: Waynetta Sandy, MD;  Location: [place] [facility];  Service: Cardiovascular;  Laterality: N/A;  . ABDOMINAL AORTOGRAM W/LOWER EXTREMITY N/A 06/27/2018   Procedure: ABDOMINAL AORTOGRAM W/LOWER EXTREMITY;  Surgeon: Serafina Mitchell, MD;  Location: [place] [facility];  Service: Cardiovascular;  Laterality: N/A;  . AMPUTATION Left 02/10/2017   Procedure: AMPUTATION TOES 3, 4 AND 5  LEFT FOOT;  Surgeon: Waynetta Sandy, MD;  Location: [place];  Service: Vascular;  Laterality: Left;  . AMPUTATION Left 12/21/2017   Procedure: LEFT FOOT 5TH RAY AMPUTATION;  Surgeon: Newt Minion, MD;  Location: [place];  Service: Orthopedics;  Laterality: Left;  . AMPUTATION Left 02/01/2018   Procedure: LEFT BELOW KNEE AMPUTATION;  Surgeon: Newt Minion, MD;  Location: [place];  Service: Orthopedics;  Laterality: Left;  . AMPUTATION Right 08/11/2018   Procedure: AMPUTATION BELOW KNEE;  Surgeon: Newt Minion, MD;  Location: [place];  Service: Orthopedics;  Laterality: Right;  . CESAREAN SECTION    . CHOLECYSTECTOMY    . LOWER EXTREMITY ANGIOGRAPHY Bilateral 09/07/2017   Procedure: Lower Extremity Angiography;  Surgeon: Waynetta Sandy, MD;  Location: [place] [facility];  Service: Cardiovascular;   Laterality: Bilateral;  . PERIPHERAL VASCULAR ATHERECTOMY  02/03/2017   Procedure: Peripheral Vascular Atherectomy;  Surgeon: Waynetta Sandy, MD;  Location: [place] [facility];  Service: Cardiovascular;;  . PERIPHERAL VASCULAR ATHERECTOMY Right 06/27/2018   Procedure: PERIPHERAL VASCULAR ATHERECTOMY;  Surgeon: Serafina Mitchell, MD;  Location: [place] [facility];  Service: Cardiovascular;  Laterality: Right;  superficial femoral  . PERIPHERAL VASCULAR BALLOON ANGIOPLASTY  02/03/2017   Procedure: Peripheral Vascular Balloon Angioplasty;  Surgeon: Waynetta Sandy, MD;  Location: [place] [facility];  Service: Cardiovascular;;  . PERIPHERAL VASCULAR INTERVENTION Left 09/07/2017   Procedure: PERIPHERAL VASCULAR INTERVENTION;  Surgeon: Waynetta Sandy, MD;  Location: [place] [facility];  Service: Cardiovascular;  Laterality: Left;  SFA/POPLITEAL  . STUMP REVISION Left 05/24/2018  . STUMP REVISION Left 05/24/2018   Procedure: REVISION LEFT BELOW KNEE AMPUTATION;  Surgeon: Newt Minion, MD;  Location: [place];  Service: Orthopedics;  Laterality: Left;  . TUBAL LIGATION       reports that she has been smoking cigarettes. She has a 28.00 pack-year smoking history. She has never used smokeless tobacco. She reports that she does not drink alcohol or use drugs.  Allergies  Allergen Reactions  . Hydrocodone-Acetaminophen Hives  . Tylenol [Acetaminophen] Itching and Swelling    Patient tolerated APAP 650 mg (06/29/18) as well as oxycodone/APAP 5-325 mg during 06/25/2018 admission ??    Family History  Problem Relation Age of Onset  . Diabetes Mellitus II Mother   . Heart disease Mother   . Heart disease Father      Prior to Admission medications   Medication Sig Start Date End Date Taking? Authorizing Provider  ALPRAZolam Duanne Moron) 1 MG tablet Take 1 mg by mouth 3 (three) times daily as needed for anxiety.  06/07/19   [provider]  amLODipine (NORVASC) 5 MG tablet  Take 5 mg by mouth daily. 06/13/19   [provider]  cloNIDine (CATAPRES - DOSED IN MG/24 HR) 0.1 mg/24hr patch 0.1 mg once a week. 07/13/19   [provider]  gabapentin (NEURONTIN) 800 MG tablet Take 1 tablet (800 mg total) by mouth 2 (two) times daily. Patient not taking: Reported on 08/20/2019 02/11/17   Hosie Poisson, MD  insulin glargine (LANTUS) 100 UNIT/ML injection Inject 0.15 mLs (15 Units total) into the skin at bedtime. 20 U in the morning and 30 U in the evening Patient not taking: Reported on 06/15/2019 08/15/18   Bonnielee Haff, MD  Insulin Syringe-Needle U-100 (INSULIN SYRINGE .5CC/31GX5/16") 31G X 5/16" 0.5 ML MISC 1 each by Does not apply route QID. Patient not taking: Reported on 08/20/2019 08/12/17   Caren Griffins, MD  LATUDA 40 MG TABS tablet Take 40 mg by mouth at bedtime. 07/05/19   [provider]  NOVOLOG 100 UNIT/ML injection Inject 0-30 Units into the skin 3 (three) times daily with meals. Sliding Scale:  >300 15 units < 300 do not use Patient not taking: Reported on 06/15/2019 08/12/17   Cruzita Lederer,  Vella Redhead, MD  oxyCODONE (OXY IR/ROXICODONE) 5 MG immediate release tablet Take 5 mg by mouth 4 (four) times daily. 06/13/19   [provider]  PROAIR HFA 108 (90 BASE) MCG/ACT inhaler Inhale 2 puffs into the lungs every 6 (six) hours as needed for shortness of breath.  01/10/15   [provider]  sodium bicarbonate 650 MG tablet Take 1,300 mg by mouth 2 (two) times daily. 07/12/19   [provider]  Vitamin D, Ergocalciferol, (DRISDOL) 1.25 MG (50000 UT) CAPS capsule Take 50,000 Units by mouth once a week. 07/13/19   [provider]    Physical Exam: Vitals:   10/31/19 1833 10/31/19 1834 10/31/19 2047 10/31/19 2100  BP: (!) 176/103  (!) 180/121 (!) 187/128  Pulse: 97  95 95  Resp: 16  18 17   Temp: 97.9 F (36.6 C)     TempSrc: Oral     SpO2: 100%  99% 100%  Weight:  72.6 kg       Vitals:   10/31/19 1833 10/31/19 1834  10/31/19 2047 10/31/19 2100  BP: (!) 176/103  (!) 180/121 (!) 187/128  Pulse: 97  95 95  Resp: 16  18 17   Temp: 97.9 F (36.6 C)     TempSrc: Oral     SpO2: 100%  99% 100%  Weight:  72.6 kg      Constitutional: NAD, alert and oriented x 3 Eyes: PERRL, lids and conjunctivae normal ENMT: Mucous membranes are moist.  Neck: normal, supple, no masses, no thyromegaly Respiratory: diminished bilaterally, no wheezing, no crackles. Normal respiratory effort. No accessory muscle use.  Cardiovascular: Regular rate and rhythm, no murmurs / rubs / gallops.  2+ pedal pulses. No carotid bruits.  Abdomen: no tenderness, no masses palpated. No hepatosplenomegaly. Bowel sounds positive.  Musculoskeletal: no clubbing / cyanosis. Bilateral BKA Skin: Papular/ pustular rash on trunk with excoriation marks Neurologic: No gross focal neurologic deficit. Psychiatric: Normal mood and affect.   Labs on Admission: I have personally reviewed following labs and imaging studies  CBC: Recent Labs  Lab 10/31/19 1848  WBC 7.6  NEUTROABS 4.5  HGB 8.3*  HCT 26.4*  MCV 95.0  PLT 123456*   Basic Metabolic Panel: Recent Labs  Lab 10/31/19 1848  NA 138  K 4.5  CL 113*  CO2 14*  GLUCOSE 142*  BUN 62*  CREATININE 7.63*  CALCIUM 8.0*  MG 2.0  PHOS 6.6*   GFR: Estimated Creatinine Clearance: 10.2 mL/min (A) (by C-G formula based on SCr of 7.63 mg/dL (H)). Liver Function Tests: Recent Labs  Lab 10/31/19 1848  AST 14*  ALT 6  ALKPHOS 87  BILITOT 0.5  PROT 6.3*  ALBUMIN 2.8*   No results for input(s): LIPASE, AMYLASE in the last 168 hours. No results for input(s): AMMONIA in the last 168 hours. Coagulation Profile: No results for input(s): INR, PROTIME in the last 168 hours. Cardiac Enzymes: No results for input(s): CKTOTAL, CKMB, CKMBINDEX, TROPONINI in the last 168 hours. BNP (last 3 results) No results for input(s): PROBNP in the last 8760 hours. HbA1C: No results for input(s): HGBA1C in  the last 72 hours. CBG: No results for input(s): GLUCAP in the last 168 hours. Lipid Profile: No results for input(s): CHOL, HDL, LDLCALC, TRIG, CHOLHDL, LDLDIRECT in the last 72 hours. Thyroid Function Tests: No results for input(s): TSH, T4TOTAL, FREET4, T3FREE, THYROIDAB in the last 72 hours. Anemia Panel: No results for input(s): VITAMINB12, FOLATE, FERRITIN, TIBC, IRON, RETICCTPCT in the last  72 hours. Urine analysis:    Component Value Date/Time   COLORURINE YELLOW 06/15/2019 2252   APPEARANCEUR CLOUDY (A) 06/15/2019 2252   LABSPEC 1.012 06/15/2019 2252   PHURINE 8.0 06/15/2019 2252   GLUCOSEU NEGATIVE 06/15/2019 2252   HGBUR NEGATIVE 06/15/2019 2252   BILIRUBINUR NEGATIVE 06/15/2019 2252   KETONESUR NEGATIVE 06/15/2019 2252   PROTEINUR 100 (A) 06/15/2019 2252   UROBILINOGEN 1.0 02/26/2015 1152   NITRITE NEGATIVE 06/15/2019 2252   LEUKOCYTESUR NEGATIVE 06/15/2019 2252    Radiological Exams on Admission: DG Chest 1 View  Result Date: 10/31/2019 CLINICAL DATA:  Shortness of breath EXAM: CHEST  1 VIEW COMPARISON:  August 22, 2019 FINDINGS: There is a well-positioned tunneled dialysis catheter on the right. The heart size is enlarged. Aortic calcifications are noted. There are small to moderate-sized bilateral pleural effusions. There is vascular congestion with early developing pulmonary edema. There is no pneumothorax. No acute osseous abnormality. IMPRESSION: 1. Cardiomegaly with vascular congestion and developing pulmonary edema. 2. Small to moderate-sized bilateral pleural effusions. 3. Well-positioned tunneled dialysis catheter. Electronically Signed   By: Constance Holster M.D.   On: 10/31/2019 19:34    EKG: Independently reviewed.   Assessment/Plan    Pulmonary edema Patient missed dialysis for 2 months and symptomatic for abdominal distention and orthopnea Chest x-ray showing vascular congestion and developing pulmonary edema, BNP elevated at 2500, Last  echocardiogram November 2020, with no mention of systolic or diastolic dysfunction Fluid overload likely related to missed dialysis Lasix IV.  Monitor electrolytes Hold beta-blocker and ACE inhibitor pending repeat echocardiogram for evaluation of LVEF We will repeat echocardiogram Nephrology, Dr. Candiss Norse consulted    ESRD on hemodialysis [facility]) Patient noncompliant with dialysis which she has missed for 2 months but still makes urine Neurology consulted for continuation of dialysis      Essential hypertension Uncontrolled likely secondary to missed dialysis Expect improvement with dialysis Labetalol as needed systolic blood pressure over 180    Uncontrolled diabetes mellitus type 2 with peripheral artery disease ([place]) Patient states she has been noncompliant with her insulin for several weeks Supplemental insulin coverage A1c to assess baseline control    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[place]) Secondary to diabetes with complication Increased nursing assistance with transfers    Generalized papular rash Suspect related to diabetes As needed Benadryl Consider dermatology consult      DVT prophylaxis: heparin  Code Status: full  Family Communication: none  Disposition Plan: Back to previous home environment Consults called: Nephrology, Dr. Joanna Puff MD [facility]     10/31/2019, 9:22 PM

## 2019-10-31 NOTE — ED Triage Notes (Addendum)
Patient states she was started on dialysis approximately 3 months ago. States she was able to go for 1 month then was no longer able to get a ride so has not gone the last 2 months. Patient reports significant abdominal swelling and pain in back. Patient also reports decreased appetite due to swelling. Denies NVD.   Patient also has red, nodular rash over abdomen.

## 2019-10-31 NOTE — ED Notes (Signed)
Cleaned pt after she used the bathroom w/ ED tech Mimi. Put new brief on pt and placed new pure wick.

## 2019-10-31 NOTE — ED Notes (Signed)
Attempted to pull pt's Amlodipine prior to transport to the floor but med had not been verified by pharmacy yet.

## 2019-10-31 NOTE — ED Provider Notes (Signed)
[facility] Emergency Department Provider Note  ____________________________________________   First MD Initiated Contact with Patient 10/31/19 1958     (approximate)  I have reviewed the triage vital signs and the nursing notes.  History  Chief Complaint Abdominal Swelling and Rash    HPI Kathy Phelps is a 39 y.o. female with history of asthma, bipolar, HLD, CKD/ESRD, previously on dialysis (has not dialyzed in 2 months) who presents for volume overload in her abdomen/back, with resultant abdominal discomfort, orthopnea.  Patient states she started dialysis approximately 3 months ago, but has not been in the last 2 months because she has not had a ride.  Over the last several weeks she has started to retain fluid, particularly in her abdomen and back area.  This is causing associated discomfort, burning/aching.  Moderate in severity, no radiation, no alleviating/aggravating factors.  She denies any nausea, vomiting, diarrhea. She reports associated shortness of breath while laying flat.  She reports compliance with her oral medications.  She makes minimal urine.     Past Medical Hx Past Medical History:  Diagnosis Date  . Anxiety   . Asthma   . Bipolar affective ([place])   . Complete miscarriage   . Constipation   . 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 Depression   . Diabetes mellitus    Type II  . Dyspnea     " only at night and I stop breathing in my sleep too for about the last three weeks"  . GERD (gastroesophageal reflux disease)   . Headache    "migraraines"  . Hyperlipidemia   . Hypertension   . Osteomyelitis ([place])    left foot  . Pregnancy complication    HELP Syndrome  . Renal disorder   . Schizophrenia ([place])   . Stroke [facility])    " mild stroke", memory loss- approx 2017    Problem List Patient Active Problem List   Diagnosis Date Noted  . Pneumonia 08/20/2019  . 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  Acute on chronic renal failure ([place]) 02/07/2019  . Anxiety and depression   . 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[place]) 08/11/2018  . CKD stage 3 secondary to diabetes ([place]) 08/11/2018  . Uncontrolled diabetes mellitus type 2 with peripheral artery disease ([place]) 08/11/2018  . Gangrene of right foot ([place])   . Lower limb ischemia 06/25/2018  . Lymphangitis 06/25/2018  . Menorrhagia 06/25/2018  . Hx of BKA, left ([place]) 05/24/2018  . History of left below knee amputation ([place]) 02/01/2018  . Gangrene of left foot ([place])   . Dehiscence of amputation stump ([place]) 01/26/2018  . Surgical wound, non healing 09/02/2017  . Bipolar 1 disorder ([place]) 09/02/2017  . Nausea vomiting and diarrhea 08/11/2017  . Great toe pain, left 08/11/2017  . GERD (gastroesophageal reflux disease) 08/10/2017  . Anxiety 08/10/2017  . Edema   . Cellulitis   . AKI (acute kidney injury) ([place]) 02/04/2017  . Type II diabetes mellitus with renal manifestations, uncontrolled ([place]) 02/02/2017  . HLD (hyperlipidemia) 02/02/2017  . Tobacco abuse 02/02/2017  . Essential hypertension 02/02/2017  . Malnutrition of moderate degree 02/02/2017    Past Surgical Hx Past Surgical History:  Procedure Laterality Date  . ABDOMINAL AORTOGRAM N/A 09/07/2017   Procedure: ABDOMINAL AORTOGRAM;  Surgeon: Waynetta Sandy, MD;  Location: [place] [facility];  Service: Cardiovascular;  Laterality: N/A;  . ABDOMINAL AORTOGRAM W/LOWER EXTREMITY N/A 02/03/2017  Procedure: Abdominal Aortogram w/Lower Extremity;  Surgeon: Waynetta Sandy, MD;  Location: [place] [facility];  Service: Cardiovascular;  Laterality: N/A;  . ABDOMINAL AORTOGRAM W/LOWER EXTREMITY N/A 06/27/2018   Procedure: ABDOMINAL AORTOGRAM W/LOWER EXTREMITY;  Surgeon: Serafina Mitchell, MD;  Location: [place] [facility];  Service: Cardiovascular;  Laterality: N/A;  . AMPUTATION Left 02/10/2017   Procedure: AMPUTATION TOES 3, 4 AND 5  LEFT FOOT;  Surgeon: Waynetta Sandy, MD;  Location: [place];  Service: Vascular;  Laterality: Left;  . AMPUTATION Left 12/21/2017   Procedure: LEFT FOOT 5TH RAY AMPUTATION;  Surgeon: Newt Minion, MD;  Location: [place];  Service: Orthopedics;  Laterality: Left;  . AMPUTATION Left 02/01/2018   Procedure: LEFT BELOW KNEE AMPUTATION;  Surgeon: Newt Minion, MD;  Location: [place];  Service: Orthopedics;  Laterality: Left;  . AMPUTATION Right 08/11/2018   Procedure: AMPUTATION BELOW KNEE;  Surgeon: Newt Minion, MD;  Location: [place];  Service: Orthopedics;  Laterality: Right;  . CESAREAN SECTION    . CHOLECYSTECTOMY    . LOWER EXTREMITY ANGIOGRAPHY Bilateral 09/07/2017   Procedure: Lower Extremity Angiography;  Surgeon: Waynetta Sandy, MD;  Location: [place] [facility];  Service: Cardiovascular;  Laterality: Bilateral;  . PERIPHERAL VASCULAR ATHERECTOMY  02/03/2017   Procedure: Peripheral Vascular Atherectomy;  Surgeon: Waynetta Sandy, MD;  Location: [place] [facility];  Service: Cardiovascular;;  . PERIPHERAL VASCULAR ATHERECTOMY Right 06/27/2018   Procedure: PERIPHERAL VASCULAR ATHERECTOMY;  Surgeon: Serafina Mitchell, MD;  Location: [place] [facility];  Service: Cardiovascular;  Laterality: Right;  superficial femoral  . PERIPHERAL VASCULAR BALLOON ANGIOPLASTY  02/03/2017   Procedure: Peripheral Vascular Balloon Angioplasty;  Surgeon: Waynetta Sandy, MD;  Location: [place] [facility];  Service: Cardiovascular;;  . PERIPHERAL VASCULAR INTERVENTION Left 09/07/2017   Procedure: PERIPHERAL VASCULAR INTERVENTION;  Surgeon: Waynetta Sandy, MD;  Location: [place] [facility];  Service: Cardiovascular;  Laterality: Left;  SFA/POPLITEAL  . STUMP REVISION Left 05/24/2018  . STUMP REVISION Left 05/24/2018   Procedure: REVISION LEFT BELOW KNEE AMPUTATION;  Surgeon: Newt Minion, MD;  Location: [place];  Service: Orthopedics;  Laterality: Left;  . TUBAL LIGATION      Medications Prior to Admission  medications   Medication Sig Start Date End Date Taking? Authorizing Provider  ALPRAZolam Duanne Moron) 1 MG tablet Take 1 mg by mouth 3 (three) times daily as needed for anxiety.  06/07/19   [provider]  amLODipine (NORVASC) 5 MG tablet Take 5 mg by mouth daily. 06/13/19   [provider]  cloNIDine (CATAPRES - DOSED IN MG/24 HR) 0.1 mg/24hr patch 0.1 mg once a week. 07/13/19   [provider]  gabapentin (NEURONTIN) 800 MG tablet Take 1 tablet (800 mg total) by mouth 2 (two) times daily. Patient not taking: Reported on 08/20/2019 02/11/17   Hosie Poisson, MD  insulin glargine (LANTUS) 100 UNIT/ML injection Inject 0.15 mLs (15 Units total) into the skin at bedtime. 20 U in the morning and 30 U in the evening Patient not taking: Reported on 06/15/2019 08/15/18   Bonnielee Haff, MD  Insulin Syringe-Needle U-100 (INSULIN SYRINGE .5CC/31GX5/16") 31G X 5/16" 0.5 ML MISC 1 each by Does not apply route QID. Patient not taking: Reported on 08/20/2019 08/12/17   Caren Griffins, MD  LATUDA 40 MG TABS tablet Take 40 mg by mouth at bedtime. 07/05/19   [provider]  NOVOLOG 100 UNIT/ML injection Inject 0-30  Units into the skin 3 (three) times daily with meals. Sliding Scale:  >300 15 units < 300 do not use Patient not taking: Reported on 06/15/2019 08/12/17   Caren Griffins, MD  oxyCODONE (OXY IR/ROXICODONE) 5 MG immediate release tablet Take 5 mg by mouth 4 (four) times daily. 06/13/19   [provider]  PROAIR HFA 108 (90 BASE) MCG/ACT inhaler Inhale 2 puffs into the lungs every 6 (six) hours as needed for shortness of breath.  01/10/15   [provider]  sodium bicarbonate 650 MG tablet Take 1,300 mg by mouth 2 (two) times daily. 07/12/19   [provider]  Vitamin D, Ergocalciferol, (DRISDOL) 1.25 MG (50000 UT) CAPS capsule Take 50,000 Units by mouth once a week. 07/13/19   [provider]    Allergies Hydrocodone-acetaminophen and Tylenol  [acetaminophen]  Family Hx Family History  Problem Relation Age of Onset  . Diabetes Mellitus II Mother   . Heart disease Mother   . Heart disease Father     Social Hx Social History   Tobacco Use  . Smoking status: Current Every Day Smoker    Packs/day: 1.00    Years: 28.00    Pack years: 28.00    Types: Cigarettes  . Smokeless tobacco: Never Used  Substance Use Topics  . Alcohol use: No  . Drug use: No     Review of Systems  Constitutional: Negative for fever, chills. Eyes: Negative for visual changes. ENT: Negative for sore throat. Cardiovascular: Negative for chest pain. Respiratory: Negative for shortness of breath. + orthopnea Gastrointestinal: Negative for nausea, vomiting. + volume in abdomen Genitourinary: Negative for dysuria. Musculoskeletal: Negative for leg swelling. Skin: + for rash. Neurological: Negative for for headaches.   Physical Exam  Vital Signs: ED Triage Vitals  Enc Vitals Group     BP 10/31/19 1833 (!) 176/103     Pulse Rate 10/31/19 1833 97     Resp 10/31/19 1833 16     Temp 10/31/19 1833 97.9 F (36.6 C)     Temp Source 10/31/19 1833 Oral     SpO2 10/31/19 1833 100 %     Weight 10/31/19 1834 160 lb (72.6 kg)     Height --      Head Circumference --      Peak Flow --      Pain Score 10/31/19 1834 10     Pain Loc --      Pain Edu? --      Excl. in [place]? --     Constitutional: Alert and oriented. Appears older than stated age.  Head: Normocephalic. Atraumatic. Eyes: Conjunctivae clear. Sclera anicteric. Nose: No congestion. No rhinorrhea. Mouth/Throat: Wearing mask.  Neck: No stridor.   Cardiovascular: Normal rate, regular rhythm. Extremities well perfused. Chest: Dialysis catheter in R upper chest, dressing does not appear to be changed for quite some time.  Respiratory: Normal respiratory effort.  Lungs CTA anteriorly, decreased at bases.  Gastrointestinal: Soft. Non-tender. Mild distension.  Musculoskeletal: Bilateral  BKA.  Neurologic:  Normal speech and language. No gross focal neurologic deficits are appreciated.  Skin: Diffuse rash, dried scabs/slightly erythematous papular lesions with excoriations.  Psychiatric: Mood and affect are appropriate for situation.  EKG  Personally reviewed.   Rate: 93 Rhythm: sinus Axis: LAD Intervals: WNL Low voltage No STEMI    Radiology  CXR: IMPRESSION: 1. Cardiomegaly with vascular congestion and developing pulmonary edema. 2. Small to moderate-sized bilateral pleural effusions. 3. Well-positioned tunneled dialysis catheter.  Procedures  Procedure(s) performed (including critical care):  Procedures   Initial Impression / Assessment and Plan / ED Course  39 y.o. female hx CKD/ESRD, has not dialyzed in 2 months, who presents to the ED for volume overload. Respiratory status stable. No hypoxia or oxygen requirement, though does complain of orthopnea.  Ddx: volume overload 2/2 CKD, HF, pulmonary edema  Labs reveal increased Cr to 7.63 from ~5-6 previously. BNP significantly elevated and CXR notable for cardiomegaly w/ pulmonary edema. Normal potassium.   Patient clearly volume overloaded, including pulmonary edema on CXR, in setting of worsening renal status. She does make a small amount of urine. Will give dose of Lasix here, but will likely need to be restarted on dialysis. Will plan for admission for further management. Patient agreeable. Discussed w/ hospitalist for admission.   Final Clinical Impression(s) / ED Diagnosis  Final diagnoses:  ESRD on dialysis [facility])  Other hypervolemia       Note:  This document was prepared using Dragon voice recognition software and may include unintentional dictation errors.   Lilia Pro., MD 10/31/19 2101

## 2019-11-01 ENCOUNTER — Inpatient Hospital Stay
Admit: 2019-11-01 | Discharge: 2019-11-01 | Disposition: A | Discharge status: Home / Self Care | Payer: Medicare Other | Attending: Internal Medicine | Admitting: Internal Medicine

## 2019-11-01 ENCOUNTER — Encounter: Payer: Self-pay | Admitting: Internal Medicine

## 2019-11-01 DIAGNOSIS — N186 End stage renal disease: Secondary | ICD-10-CM

## 2019-11-01 DIAGNOSIS — I5021 Acute systolic (congestive) heart failure: Secondary | ICD-10-CM

## 2019-11-01 DIAGNOSIS — I70209 Unspecified atherosclerosis of native arteries of extremities, unspecified extremity: Secondary | ICD-10-CM

## 2019-11-01 DIAGNOSIS — Z992 Dependence on renal dialysis: Secondary | ICD-10-CM

## 2019-11-01 DIAGNOSIS — I10 Essential (primary) hypertension: Secondary | ICD-10-CM

## 2019-11-01 DIAGNOSIS — E1151 Type 2 diabetes mellitus with diabetic peripheral angiopathy without gangrene: Secondary | ICD-10-CM

## 2019-11-01 DIAGNOSIS — E8779 Other fluid overload: Secondary | ICD-10-CM

## 2019-11-01 LAB — BASIC METABOLIC PANEL
Anion gap: 10 (ref 5–15)
BUN: 63 mg/dL — ABNORMAL HIGH (ref 6–20)
CO2: 15 mmol/L — ABNORMAL LOW (ref 22–32)
Calcium: 8.1 mg/dL — ABNORMAL LOW (ref 8.9–10.3)
Chloride: 117 mmol/L — ABNORMAL HIGH (ref 98–111)
Creatinine, Ser: 7.75 mg/dL — ABNORMAL HIGH (ref 0.44–1.00)
GFR calc Af Amer: 7 mL/min — ABNORMAL LOW (ref >60)
GFR calc non Af Amer: 6 mL/min — ABNORMAL LOW (ref >60)
Glucose, Bld: 80 mg/dL (ref 70–99)
Potassium: 4.5 mmol/L (ref 3.5–5.1)
Sodium: 142 mmol/L (ref 135–145)

## 2019-11-01 LAB — GLUCOSE, CAPILLARY
Glucose-Capillary: 69 mg/dL — ABNORMAL LOW (ref 70–99)
Glucose-Capillary: 83 mg/dL (ref 70–99)
Glucose-Capillary: 76 mg/dL (ref 70–99)
Glucose-Capillary: 81 mg/dL (ref 70–99)

## 2019-11-01 LAB — HEMOGLOBIN A1C
Hgb A1c MFr Bld: 5.6 % (ref 4.8–5.6)
Mean Plasma Glucose: 114.02 mg/dL

## 2019-11-01 LAB — HEPATITIS B SURFACE ANTIBODY,QUALITATIVE: Hep B S Ab: NONREACTIVE

## 2019-11-01 LAB — HIV ANTIBODY (ROUTINE TESTING W REFLEX): HIV Screen 4th Generation wRfx: NONREACTIVE

## 2019-11-01 LAB — HEPATITIS B CORE ANTIBODY, TOTAL: Hep B Core Total Ab: NONREACTIVE

## 2019-11-01 LAB — ECHOCARDIOGRAM COMPLETE: Weight: 2551.69 oz

## 2019-11-01 LAB — HEPATITIS C ANTIBODY: HCV Ab: NONREACTIVE

## 2019-11-01 LAB — HEPATITIS B SURFACE ANTIGEN: Hepatitis B Surface Ag: NONREACTIVE

## 2019-11-01 LAB — MRSA PCR SCREENING: MRSA by PCR: NEGATIVE

## 2019-11-01 MED ORDER — EPOETIN ALFA 10000 UNIT/ML IJ SOLN: 4000.0000 [IU] | Freq: Once | INTRAMUSCULAR | Status: DC

## 2019-11-01 MED ORDER — GABAPENTIN 400 MG PO CAPS
800.0000 mg | ORAL_CAPSULE | Freq: Two times a day (BID) | ORAL | Status: DC
  Administered 2019-11-01 – 2019-11-08 (×14): 800 mg via ORAL
  Filled 2019-11-01 (×8): qty 2
  Filled 2019-11-01: qty 8
  Filled 2019-11-01 (×2): qty 2
  Filled 2019-11-01: qty 8
  Filled 2019-11-01 (×5): qty 2

## 2019-11-01 MED ORDER — HYDROCERIN EX CREA
TOPICAL_CREAM | Freq: Two times a day (BID) | CUTANEOUS | Status: DC
  Administered 2019-11-04: 1 via TOPICAL
  Filled 2019-11-01 (×2): qty 113

## 2019-11-01 MED ORDER — LURASIDONE HCL 40 MG PO TABS
60.0000 mg | ORAL_TABLET | Freq: Every day | ORAL | Status: DC
  Administered 2019-11-01 – 2019-11-07 (×7): 60 mg via ORAL
  Filled 2019-11-01 (×9): qty 2

## 2019-11-01 MED ORDER — ALPRAZOLAM 1 MG PO TABS
1.0000 mg | ORAL_TABLET | Freq: Three times a day (TID) | ORAL | Status: DC | PRN
  Administered 2019-11-02 – 2019-11-07 (×9): 1 mg via ORAL
  Filled 2019-11-01 (×9): qty 1

## 2019-11-01 MED ORDER — DIPHENHYDRAMINE HCL 25 MG PO CAPS
25.0000 mg | ORAL_CAPSULE | Freq: Three times a day (TID) | ORAL | Status: DC | PRN
  Administered 2019-11-01 – 2019-11-08 (×9): 25 mg via ORAL
  Filled 2019-11-01 (×10): qty 1

## 2019-11-01 MED ORDER — HEPARIN SODIUM (PORCINE) 1000 UNIT/ML DIALYSIS
20.0000 [IU]/kg | INTRAMUSCULAR | Status: DC | PRN
  Filled 2019-11-01: qty 2

## 2019-11-01 MED ORDER — CHLORHEXIDINE GLUCONATE CLOTH 2 % EX PADS
6.0000 | MEDICATED_PAD | Freq: Every day | CUTANEOUS | Status: DC
  Administered 2019-11-03 – 2019-11-08 (×4): 6 via TOPICAL

## 2019-11-01 MED ORDER — ALTEPLASE 2 MG IJ SOLR: 2.0000 mg | Freq: Once | INTRAMUSCULAR | Status: DC

## 2019-11-01 NOTE — Progress Notes (Signed)
Patient is back from dialysis. Per patient they were unable to start dialysis due to flow issues.

## 2019-11-01 NOTE — Progress Notes (Signed)
Patient's hemodialysis catheter site dressing noticeably in need of dressing change. This nurse offered to complete a site dressing change in which the patient refused until scheduled pain meds administered.

## 2019-11-01 NOTE — Progress Notes (Signed)
Patient was established at [facility] ([place]) however due to patient not showing up to treatments or answering calls or returning calls,  Patient has been discharged by Dr.J Posey Pronto with [facility] [place]. Patient does not have a clinic to go to upon discharge, new patient labs will be required for new referral to be sent out; Hep B antigen, antibody and total core, also PPD or CXR. Will follow up with patient about which clinic they would like to go to. Please contact me with any dialysis placement concerns.  Elvera Bicker Dialysis Coordinator [phone]

## 2019-11-01 NOTE — Progress Notes (Addendum)
Progress Note    Kathy Phelps  [id number] DOB: 24-Mar-1981  DOA: 10/31/2019 PCP: Davonna Belling, MD      Brief Narrative:    Medical records reviewed and are as summarized below:  Kathy Phelps is an 39 y.o. 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  She states that she missed dialysis for the past 2 months because she did not have a ride.  She is short of breath only when she lays flat but feels fine when she is sitting up.  She denies cough, chest pain, fever or chills.  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   End-stage renal disease with fluid overload/acute systolic heart failure secondary to missed dialysis: 2D echo showed EF estimated at 45 to 50%, mild LVH.  Follow-up with nephrologist for hemodialysis.  IDDM: Patient said she has not taken her insulin for the about 2 weeks at high blood sugar has been okay.  NovoLog as needed for hyperglycemia.  Hypertensive urgency: BP is better.  Continue antihypertensives.  Generalized papular rash: Etiology unclear.  Recommended outpatient follow-up with dermatologist.  COVID-19 infection: Asymptomatic.  She is tolerating room air.  No indication for treatment or remdesivir or  steroids at this time.  Bipolar disorder: Continue Latuda  PVD: Status post bilateral BKA   Family Communication/Anticipated D/C date and plan/Code Status   DVT prophylaxis: Heparin Code Status: Full code Family Communication: Plan discussed with the patient Disposition Plan: Possible discharge to home in 1 to 2 days      Subjective:   She complains of an itchy rash that has been present for about a month.  She is not sure how she got it.  No cough, fever, shortness of breath, chest pain.  Objective:    Vitals:   11/01/19 0055 11/01/19 0405 11/01/19 0809 11/01/19 1558  BP: (!) 190/109 (!) 145/100 (!) 152/99 133/88  Pulse: 83 93 94 85  Resp:  15 18 18   Temp:  98.2 F (36.8 C) 97.9 F (36.6 C) 97.6 F (36.4 C)  TempSrc:  Oral Oral Oral  SpO2:  99% 98% 98%  Weight:  72.3 kg      Intake/Output Summary (Last 24 hours) at 11/01/2019 1715 Last data filed at 11/01/2019 1500 Gross per 24 hour  Intake 130 ml  Output 600 ml  Net -470 ml   Filed Weights   10/31/19 1834 11/01/19 0405  Weight: 72.6 kg 72.3 kg    Exam:  GEN: NAD SKIN: Diffuse papular rash on the chest, back and thighs EYES: EOMI ENT: MMM CV: RRR PULM: CTA B ABD: soft, abdominal distention, NT, +BS CNS: AAO x 3, non focal EXT: Bilateral BKA.  Swelling on bilateral thighs.   Data Reviewed:  I have personally reviewed following labs and imaging studies:  Labs: Labs show the following:   Basic Metabolic Panel: Recent Labs  Lab 10/31/19 1848 11/01/19 0637  NA 138 142  K 4.5 4.5  CL 113* 117*  CO2 14* 15*  GLUCOSE 142* 80  BUN 62* 63*  CREATININE 7.63* 7.75*  CALCIUM 8.0* 8.1*  MG 2.0  --   PHOS 6.6*  --    GFR Estimated Creatinine Clearance: 10.1 mL/min (A) (by C-G formula based on SCr of 7.75 mg/dL (H)). Liver Function Tests: Recent Labs  Lab 10/31/19 1848  AST 14*  ALT 6  ALKPHOS 87  BILITOT 0.5  PROT 6.3*  ALBUMIN 2.8*   No results for input(s): LIPASE, AMYLASE in the  last 168 hours. No results for input(s): AMMONIA in the last 168 hours. Coagulation profile No results for input(s): INR, PROTIME in the last 168 hours.  CBC: Recent Labs  Lab 10/31/19 1848  WBC 7.6  NEUTROABS 4.5  HGB 8.3*  HCT 26.4*  MCV 95.0  PLT 144*   Cardiac Enzymes: No results for input(s): CKTOTAL, CKMB, CKMBINDEX, TROPONINI in the last 168 hours. BNP (last 3 results) No results for input(s): PROBNP in the last 8760 hours. CBG: Recent Labs  Lab 10/31/19 2205 11/01/19 0806 11/01/19 0834 11/01/19 1147 11/01/19 1645  GLUCAP 103* 69* 83 76 81   D-Dimer: No results for input(s): DDIMER in the last 72 hours. Hgb A1c: Recent Labs    11/01/19 0637  HGBA1C 5.6   Lipid Profile: No results for input(s): CHOL, HDL, LDLCALC, TRIG, CHOLHDL, LDLDIRECT in the last 72 hours. Thyroid function studies: No results for input(s): TSH, T4TOTAL, T3FREE, THYROIDAB in the last 72 hours.  Invalid input(s): FREET3 Anemia work up: No results for input(s): VITAMINB12, FOLATE, FERRITIN, TIBC, IRON, RETICCTPCT in the last 72 hours. Sepsis Labs: Recent Labs  Lab 10/31/19 1848  WBC 7.6    Microbiology Recent Results (from the past 240 hour(s))  Respiratory Panel by RT PCR (Flu A&B, Covid) - Nasopharyngeal Swab     Status: Abnormal   Collection Time: 10/31/19  8:56 PM   Specimen: Nasopharyngeal Swab  Result Value Ref Range Status   SARS Coronavirus 2 by RT PCR POSITIVE (A) NEGATIVE Final    Comment: RESULT CALLED TO, READ BACK BY AND VERIFIED WITH: Enis Gash SLADE 10/31/19 @ 2218 [place] (NOTE) SARS-CoV-2 target nucleic acids are DETECTED. SARS-CoV-2 RNA is generally detectable in upper respiratory specimens  during the acute phase of infection. Positive results are indicative of the presence of the identified virus, but do not rule out bacterial infection or co-infection with other pathogens not detected by the test. Clinical correlation with patient history and other diagnostic  information is necessary to determine patient infection status. The expected result is Negative. Fact Sheet for Patients:  PinkCheek.be Fact Sheet for Healthcare Providers: GravelBags.it This test is not yet approved or cleared by the [place] FDA and  has been authorized for detection and/or diagnosis of SARS-CoV-2 by FDA under an Emergency Use Authorization (EUA).  This EUA will remain in effect (meaning this test can be used) for th e duration of  the COVID-19 declaration under Section 564(b)(1) of the Act, 21 U.S.C. section 360bbb-3(b)(1), unless the authorization is terminated or revoked sooner.    Influenza A by PCR NEGATIVE NEGATIVE Final   Influenza B by PCR NEGATIVE NEGATIVE Final    Comment: (NOTE) The Xpert Xpress SARS-CoV-2/FLU/RSV assay is intended as an aid in  the  diagnosis of influenza from Nasopharyngeal swab specimens and  should not be used as a sole basis for treatment. Nasal washings and  aspirates are unacceptable for Xpert Xpress SARS-CoV-2/FLU/RSV  testing. Fact Sheet for Patients: PinkCheek.be Fact Sheet for Healthcare Providers: GravelBags.it This test is not yet approved or cleared by the [place] FDA and  has been authorized for detection and/or diagnosis of SARS-CoV-2 by  FDA under an Emergency Use Authorization (EUA). This EUA will remain  in effect (meaning this test can be used) for the duration of the  Covid-19 declaration under Section 564(b)(1) of the Act, 21  U.S.C. section 360bbb-3(b)(1), unless the authorization is  terminated or revoked. Performed at [facility], [address] [place], [place] [address]   MRSA PCR Screening     Status: None   Collection Time: 10/31/19 11:11 PM   Specimen: Nasopharyngeal  Result Value Ref Range Status   MRSA by PCR NEGATIVE NEGATIVE Final    Comment:        The  GeneXpert MRSA Assay (FDA approved for NASAL specimens only), is one component of a comprehensive MRSA colonization surveillance program. It is not intended to diagnose MRSA infection nor to guide or monitor treatment for MRSA infections. Performed at [facility], [address] [place], [place] [address]     Procedures and diagnostic studies:  DG Chest 1 View  Result Date: 10/31/2019 CLINICAL DATA:  Shortness of breath EXAM: CHEST  1 VIEW COMPARISON:  August 22, 2019 FINDINGS: There is a well-positioned tunneled dialysis catheter on the right. The heart size is enlarged. Aortic calcifications are noted. There are small to moderate-sized bilateral pleural effusions. There is vascular congestion with early developing pulmonary edema. There is no pneumothorax. No acute osseous abnormality. IMPRESSION: 1. Cardiomegaly with vascular congestion and developing pulmonary edema. 2. Small to moderate-sized bilateral pleural effusions. 3. Well-positioned tunneled dialysis catheter. Electronically Signed   By: Constance Holster M.D.   On: 10/31/2019 19:34   ECHOCARDIOGRAM COMPLETE  Result Date: 11/01/2019   ECHOCARDIOGRAM REPORT   Patient Name:   Kathy Phelps Date of Exam: 11/01/2019 Medical Rec #:  [id number]       Height:       69.0 in Accession #:    [id number]      Weight:       159.5 lb Date of Birth:  May 01, 1981        BSA:          1.88 m Patient Age:    43 years        BP:           152/99 mmHg Patient Gender: F               HR:           94 bpm. Exam Location:  [facility] Procedure: 2D Echo, Color Doppler and Cardiac Doppler Indications:     CHF-cute diastolic [id number]  History:         Patient has prior history of Echocardiogram examinations, most                  recent 08/20/2019. Stroke; Risk Factors:Hypertension and                  Diabetes. Schizophrenia.  Sonographer:     Sherrie Sport RDCS (AE) Referring Phys:  [id number] Athena Masse Diagnosing Phys: Yolonda Kida MD IMPRESSIONS   1. Left ventricular ejection fraction, by visual estimation, is  45 to 50%. The left ventricle has low normal function. There is mildly increased left ventricular hypertrophy.  2. The left ventricle has no regional wall motion abnormalities.  3. Global right ventricle has normal systolic function.The right ventricular size is normal. No increase in right ventricular wall thickness.  4. Left atrial size was normal.  5. Right atrial size was normal.  6. The mitral valve is normal in structure. No evidence of mitral valve regurgitation.  7. The tricuspid valve is normal in structure.  8. The tricuspid valve is normal in structure. Tricuspid valve regurgitation is trivial.  9. The aortic valve is normal in structure. Aortic valve regurgitation is not visualized. 10. The pulmonic valve was grossly normal. Pulmonic valve regurgitation is not visualized. 11. Moderately elevated pulmonary artery systolic pressure. FINDINGS  Left Ventricle: Left ventricular ejection fraction, by visual estimation, is 45 to 50%. The left ventricle has low normal function. The left ventricle has no regional wall motion abnormalities. There is mildly increased left ventricular hypertrophy. Left ventricular diastolic parameters were normal. Right Ventricle: The right ventricular size is normal. No increase in right ventricular wall thickness. Global RV systolic function is has normal systolic function. The tricuspid regurgitant velocity is 3.06 m/s, and with an assumed right atrial pressure  of 10 mmHg, the estimated right ventricular systolic pressure is moderately elevated at 47.5 mmHg. Left Atrium: Left atrial size was normal in size. Right Atrium: Right atrial size was normal in size Pericardium: There is no evidence of pericardial effusion. Mitral Valve: The mitral valve is normal in structure. No evidence of mitral valve regurgitation. Tricuspid Valve: The tricuspid valve is normal in structure. Tricuspid valve regurgitation is trivial.  Aortic Valve: The aortic valve is normal in structure. Aortic valve regurgitation is not visualized. Aortic valve mean gradient measures 4.0 mmHg. Aortic valve peak gradient measures 5.9 mmHg. Aortic valve area, by VTI measures 2.33 cm. Pulmonic Valve: The pulmonic valve was grossly normal. Pulmonic valve regurgitation is not visualized. Pulmonic regurgitation is not visualized. Aorta: The aortic root is normal in size and structure. IAS/Shunts: No atrial level shunt detected by color flow Doppler.  LEFT VENTRICLE PLAX 2D LVIDd:         5.77 cm  Diastology LVIDs:         3.79 cm  LV e' lateral:   6.20 cm/s LV PW:         1.71 cm  LV E/e' lateral: 18.4 LV IVS:        0.97 cm  LV e' medial:    5.11 cm/s LVOT diam:     2.00 cm  LV E/e' medial:  22.3 LV SV:         103 ml LV SV Index:   54.77 LVOT Area:     3.14 cm  RIGHT VENTRICLE RV Basal diam:  4.51 cm LEFT ATRIUM           Index       RIGHT ATRIUM           Index LA diam:      4.30 cm 2.29 cm/m  RA Area:     12.80 cm LA Vol (A2C): 64.5 ml 34.37 ml/m RA Volume:   29.30 ml  15.61 ml/m LA Vol (A4C): 51.0 ml 27.18 ml/m  AORTIC VALVE                   PULMONIC VALVE AV Area (Vmax):    2.73 cm    PV Vmax:  0.61 m/s AV Area (Vmean):   2.41 cm    PV Peak grad:   1.5 mmHg AV Area (VTI):     2.33 cm    RVOT Peak grad: 2 mmHg AV Vmax:           121.00 cm/s AV Vmean:          90.100 cm/s AV VTI:            0.193 m AV Peak Grad:      5.9 mmHg AV Mean Grad:      4.0 mmHg LVOT Vmax:         105.00 cm/s LVOT Vmean:        69.100 cm/s LVOT VTI:          0.143 m LVOT/AV VTI ratio: 0.74  AORTA Ao Root diam: 3.30 cm MITRAL VALVE                         TRICUSPID VALVE MV Area (PHT): 6.32 cm              TR Peak grad:   37.5 mmHg MV PHT:        34.80 msec            TR Vmax:        316.00 cm/s MV Decel Time: 120 msec MV E velocity: 114.00 cm/s 103 cm/s  SHUNTS MV A velocity: 73.70 cm/s  70.3 cm/s Systemic VTI:  0.14 m MV E/A ratio:  1.55        1.5       Systemic Diam:  2.00 cm  Dwayne Prince Rome MD Electronically signed by Yolonda Kida MD Signature Date/Time: 11/01/2019/2:06:52 PM    Final     Medications:   . amLODipine  5 mg Oral Daily  . Chlorhexidine Gluconate Cloth  6 each Topical Daily  . epoetin (EPOGEN/PROCRIT) injection  4,000 Units Intravenous Once  . furosemide  80 mg Intravenous BID  . gabapentin  800 mg Oral BID  . heparin  5,000 Units Subcutaneous Q8H  . insulin aspart  0-5 Units Subcutaneous QHS  . insulin aspart  0-6 Units Subcutaneous TID WC  . lurasidone  60 mg Oral QHS  . oxyCODONE  5 mg Oral QID  . sodium chloride flush  3 mL Intravenous Q12H   Continuous Infusions: . sodium chloride       LOS: 1 day   [address] [place]  [facility]   *Please refer to [email or web address], password [email or web address] to get updated schedule on who will round on this patient, as hospitalists switch teams weekly. If 7PM-7AM, please contact night-coverage at [email or web address], password [email or web address] for any overnight needs.  11/01/2019, 5:15 PM

## 2019-11-01 NOTE — Consult Note (Signed)
[address] [place], [place] [address] Phone [phone]. Fax [phone]  Date: 11/01/2019                  Patient Name:  Kathy Phelps  MRN: [id number]  DOB: [birth year]  Age / Sex: 39 y.o., female         PCP: Davonna Belling, MD                 Service Requesting Consult: IM/ Jennye Boroughs, MD                 Reason for Consult: ESRD            History of Present Illness: Patient is a 39 y.o. female with medical problems of ESRD, who was admitted to [facility] on 10/31/2019 for evaluation of increasing edema.  Patient reports she presented to the emergency room because of increasing edema.  She states she was started on dialysis about 3 months ago.  She dialyzed for about a month and then took herself off because could not find any transportation to the center.  She then started to develop edema in her abdomen and legs and now presents for further evaluation. Patient has a right IJ PermCath   Medications: Outpatient medications: Medications Prior to Admission  Medication Sig Dispense Refill Last Dose  . ALPRAZolam (XANAX) 1 MG tablet Take 1 mg by mouth 3 (three) times daily as needed for anxiety.      Marland Kitchen amLODipine (NORVASC) 5 MG tablet Take 5 mg by mouth daily.     . cloNIDine (CATAPRES - DOSED IN MG/24 HR) 0.1 mg/24hr patch 0.1 mg once a week.     . gabapentin (NEURONTIN) 800 MG tablet Take 1 tablet (800 mg total) by mouth 2 (two) times daily. (Patient not taking: Reported on 08/20/2019) 60 tablet 0   . insulin glargine (LANTUS) 100 UNIT/ML injection Inject 0.15 mLs (15 Units total) into the skin at bedtime. 20 U in the morning and 30 U in the evening (Patient not taking: Reported on 06/15/2019) 2 vial 0   . Insulin Syringe-Needle U-100 (INSULIN SYRINGE .5CC/31GX5/16") 31G X 5/16" 0.5 ML MISC 1 each by Does not apply route QID. (Patient not taking: Reported on 08/20/2019) 100 each 1   . LATUDA 40 MG TABS tablet Take 40 mg by mouth at bedtime.     Marland Kitchen NOVOLOG 100  UNIT/ML injection Inject 0-30 Units into the skin 3 (three) times daily with meals. Sliding Scale:  >300 15 units < 300 do not use (Patient not taking: Reported on 06/15/2019) 10 mL 3   . oxyCODONE (OXY IR/ROXICODONE) 5 MG immediate release tablet Take 5 mg by mouth 4 (four) times daily.     Marland Kitchen PROAIR HFA 108 (90 BASE) MCG/ACT inhaler Inhale 2 puffs into the lungs every 6 (six) hours as needed for shortness of breath.   0   . sodium bicarbonate 650 MG tablet Take 1,300 mg by mouth 2 (two) times daily.     . Vitamin D, Ergocalciferol, (DRISDOL) 1.25 MG (50000 UT) CAPS capsule Take 50,000 Units by mouth once a week.       Current medications: Current Facility-Administered Medications  Medication Dose Route Frequency Provider Last Rate Last Admin  . 0.9 %  sodium chloride infusion  250 mL Intravenous PRN Athena Masse, MD      . acetaminophen (TYLENOL) tablet 650 mg  650 mg Oral Q4H PRN Athena Masse, MD      .  amLODipine (NORVASC) tablet 5 mg  5 mg Oral Daily Athena Masse, MD   5 mg at 11/01/19 0913  . Chlorhexidine Gluconate Cloth 2 % PADS 6 each  6 each Topical Daily Jennye Boroughs, MD      . furosemide (LASIX) injection 80 mg  80 mg Intravenous BID Judd Gaudier V, MD   80 mg at 11/01/19 0912  . heparin injection 5,000 Units  5,000 Units Subcutaneous Q8H Athena Masse, MD   5,000 Units at 11/01/19 [id number]  . insulin aspart (novoLOG) injection 0-5 Units  0-5 Units Subcutaneous QHS Judd Gaudier V, MD      . insulin aspart (novoLOG) injection 0-6 Units  0-6 Units Subcutaneous TID WC Athena Masse, MD      . ondansetron [facility]) injection 4 mg  4 mg Intravenous Q6H PRN Athena Masse, MD      . oxyCODONE (Oxy IR/ROXICODONE) immediate release tablet 5 mg  5 mg Oral QID Lang Snow, NP   5 mg at 11/01/19 0913  . sodium chloride flush (NS) 0.9 % injection 3 mL  3 mL Intravenous Q12H Athena Masse, MD   3 mL at 11/01/19 1004  . sodium chloride flush (NS) 0.9 % injection 3 mL  3 mL  Intravenous PRN Athena Masse, MD          Allergies: Allergies  Allergen Reactions  . Hydrocodone-Acetaminophen Hives  . Tylenol [Acetaminophen] Itching and Swelling    Patient tolerated APAP 650 mg (06/29/18) as well as oxycodone/APAP 5-325 mg during 06/25/2018 admission ??      Past Medical History: Past Medical History:  Diagnosis Date  . Anxiety   . Asthma   . Bipolar affective ([place])   . Complete miscarriage   . Constipation   . 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 Depression   . Diabetes mellitus    Type II  . Dyspnea     " only at night and I stop breathing in my sleep too for about the last three weeks"  . GERD (gastroesophageal reflux disease)   . Headache    "migraraines"  . Hyperlipidemia   . Hypertension   . Osteomyelitis ([place])    left foot  . Pregnancy complication    HELP Syndrome  . Renal disorder   . Schizophrenia ([place])   . Stroke [facility])    " mild stroke", memory loss- approx 2017     Past Surgical History: Past Surgical History:  Procedure Laterality Date  . ABDOMINAL AORTOGRAM N/A 09/07/2017   Procedure: ABDOMINAL AORTOGRAM;  Surgeon: Waynetta Sandy, MD;  Location: [place] [facility];  Service: Cardiovascular;  Laterality: N/A;  . ABDOMINAL AORTOGRAM W/LOWER EXTREMITY N/A 02/03/2017   Procedure: Abdominal Aortogram w/Lower Extremity;  Surgeon: Waynetta Sandy, MD;  Location: [place] [facility];  Service: Cardiovascular;  Laterality: N/A;  . ABDOMINAL AORTOGRAM W/LOWER EXTREMITY N/A 06/27/2018   Procedure: ABDOMINAL AORTOGRAM W/LOWER EXTREMITY;  Surgeon: Serafina Mitchell, MD;  Location: [place] [facility];  Service: Cardiovascular;  Laterality: N/A;  . AMPUTATION Left 02/10/2017   Procedure: AMPUTATION TOES 3, 4 AND 5  LEFT FOOT;  Surgeon: Waynetta Sandy, MD;  Location: [place];  Service: Vascular;  Laterality: Left;  . AMPUTATION Left 12/21/2017   Procedure: LEFT FOOT 5TH RAY AMPUTATION;  Surgeon:  Newt Minion, MD;  Location: [place];  Service: Orthopedics;  Laterality: Left;  . AMPUTATION Left 02/01/2018  Procedure: LEFT BELOW KNEE AMPUTATION;  Surgeon: Newt Minion, MD;  Location: [place];  Service: Orthopedics;  Laterality: Left;  . AMPUTATION Right 08/11/2018   Procedure: AMPUTATION BELOW KNEE;  Surgeon: Newt Minion, MD;  Location: [place];  Service: Orthopedics;  Laterality: Right;  . CESAREAN SECTION    . CHOLECYSTECTOMY    . LOWER EXTREMITY ANGIOGRAPHY Bilateral 09/07/2017   Procedure: Lower Extremity Angiography;  Surgeon: Waynetta Sandy, MD;  Location: [place] [facility];  Service: Cardiovascular;  Laterality: Bilateral;  . PERIPHERAL VASCULAR ATHERECTOMY  02/03/2017   Procedure: Peripheral Vascular Atherectomy;  Surgeon: Waynetta Sandy, MD;  Location: [place] [facility];  Service: Cardiovascular;;  . PERIPHERAL VASCULAR ATHERECTOMY Right 06/27/2018   Procedure: PERIPHERAL VASCULAR ATHERECTOMY;  Surgeon: Serafina Mitchell, MD;  Location: [place] [facility];  Service: Cardiovascular;  Laterality: Right;  superficial femoral  . PERIPHERAL VASCULAR BALLOON ANGIOPLASTY  02/03/2017   Procedure: Peripheral Vascular Balloon Angioplasty;  Surgeon: Waynetta Sandy, MD;  Location: [place] [facility];  Service: Cardiovascular;;  . PERIPHERAL VASCULAR INTERVENTION Left 09/07/2017   Procedure: PERIPHERAL VASCULAR INTERVENTION;  Surgeon: Waynetta Sandy, MD;  Location: [place] [facility];  Service: Cardiovascular;  Laterality: Left;  SFA/POPLITEAL  . STUMP REVISION Left 05/24/2018  . STUMP REVISION Left 05/24/2018   Procedure: REVISION LEFT BELOW KNEE AMPUTATION;  Surgeon: Newt Minion, MD;  Location: [place];  Service: Orthopedics;  Laterality: Left;  . TUBAL LIGATION       Family History: Family History  Problem Relation Age of Onset  . Diabetes Mellitus II Mother   . Heart disease Mother   . Heart disease Father      Social History: Social  History   Socioeconomic History  . Marital status: Single    Spouse name: Not on file  . Number of children: Not on file  . Years of education: Not on file  . Highest education level: Not on file  Occupational History  . Not on file  Tobacco Use  . Smoking status: Current Every Day Smoker    Packs/day: 1.00    Years: 28.00    Pack years: 28.00    Types: Cigarettes  . Smokeless tobacco: Never Used  Substance and Sexual Activity  . Alcohol use: No  . Drug use: No  . Sexual activity: Not on file  Other Topics Concern  . Not on file  Social History Narrative  . Not on file   Social Determinants of Health   Financial Resource Strain:   . Difficulty of Paying Living Expenses: Not on file  Food Insecurity:   . Worried About Charity fundraiser in the Last Year: Not on file  . Ran Out of Food in the Last Year: Not on file  Transportation Needs:   . Lack of Transportation (Medical): Not on file  . Lack of Transportation (Non-Medical): Not on file  Physical Activity:   . Days of Exercise per Week: Not on file  . Minutes of Exercise per Session: Not on file  Stress:   . Feeling of Stress : Not on file  Social Connections:   . Frequency of Communication with Friends and Family: Not on file  . Frequency of Social Gatherings with Friends and Family: Not on file  . Attends Religious Services: Not on file  . Active Member of Clubs or Organizations: Not on file  . Attends Archivist Meetings: Not on file  . Marital Status:  Not on file  Intimate Partner Violence:   . Fear of Current or Ex-Partner: Not on file  . Emotionally Abused: Not on file  . Physically Abused: Not on file  . Sexually Abused: Not on file     Review of Systems: Gen: Denies any fevers or chills HEENT: Denies vision or hearing complaints CV: Worsening lower extremity edema and abdominal swelling Resp: Some congestion GI: no nausea or vomiting but low appetite due to abdominal swelling GU :  Denies any dysuria MS: Has leg amputations due to diabetic complications Derm:    Pruritic nodules have developed all over abdomen and back Psych: No complaints Heme: No complaints Neuro: No complaints Endocrine.  Patient is diabetic  Vital Signs: Blood pressure (!) 152/99, pulse 94, temperature 97.9 F (36.6 C), temperature source Oral, resp. rate 18, weight 72.3 kg, last menstrual period 09/30/2019, SpO2 98 %.   Intake/Output Summary (Last 24 hours) at 11/01/2019 1152 Last data filed at 11/01/2019 0651 Gross per 24 hour  Intake 130 ml  Output 100 ml  Net 30 ml    Weight trends: Filed Weights   10/31/19 1834 11/01/19 0405  Weight: 72.6 kg 72.3 kg    Physical Exam: General:  No acute distress, laying in the bed  HEENT  anicteric, moist oral mucous membranes  Lungs:  Normal breathing effort, mild basilar crackles  Heart::  No rub or gallop  Abdomen:  Soft, mildly distended, pitting edema over back  Extremities:  Bilateral leg amputations  Neurologic:  Alert, able to answer questions  Skin:  Prurigo nodules over abdomen and back  Access:  Right IJ PermCath    Lab results: Basic Metabolic Panel: Recent Labs  Lab 10/31/19 1848 11/01/19 0637  NA 138 142  K 4.5 4.5  CL 113* 117*  CO2 14* 15*  GLUCOSE 142* 80  BUN 62* 63*  CREATININE 7.63* 7.75*  CALCIUM 8.0* 8.1*  MG 2.0  --   PHOS 6.6*  --     Liver Function Tests: Recent Labs  Lab 10/31/19 1848  AST 14*  ALT 6  ALKPHOS 87  BILITOT 0.5  PROT 6.3*  ALBUMIN 2.8*   No results for input(s): LIPASE, AMYLASE in the last 168 hours. No results for input(s): AMMONIA in the last 168 hours.  CBC: Recent Labs  Lab 10/31/19 1848  WBC 7.6  NEUTROABS 4.5  HGB 8.3*  HCT 26.4*  MCV 95.0  PLT 144*    Cardiac Enzymes: No results for input(s): CKTOTAL, TROPONINI in the last 168 hours.  BNP: Invalid input(s): POCBNP  CBG: Recent Labs  Lab 10/31/19 2205 11/01/19 0806 11/01/19 0834 11/01/19 1147   GLUCAP 103* 69* 83 76    Microbiology: Recent Results (from the past 720 hour(s))  Respiratory Panel by RT PCR (Flu A&B, Covid) - Nasopharyngeal Swab     Status: Abnormal   Collection Time: 10/31/19  8:56 PM   Specimen: Nasopharyngeal Swab  Result Value Ref Range Status   SARS Coronavirus 2 by RT PCR POSITIVE (A) NEGATIVE Final    Comment: RESULT CALLED TO, READ BACK BY AND VERIFIED WITH: Anderson Malta 10/31/19 @ [address] (NOTE) SARS-CoV-2 target nucleic acids are DETECTED. SARS-CoV-2 RNA is generally detectable in upper respiratory specimens  during the acute phase of infection. Positive results are indicative of the presence of the identified virus, but do not rule out bacterial infection or co-infection with other pathogens not detected by the test. Clinical correlation with patient history and other diagnostic information  is necessary to determine patient infection status. The expected result is Negative. Fact Sheet for Patients:  PinkCheek.be Fact Sheet for Healthcare Providers: GravelBags.it This test is not yet approved or cleared by the [place] FDA and  has been authorized for detection and/or diagnosis of SARS-CoV-2 by FDA under an Emergency Use Authorization (EUA).  This EUA will remain in effect (meaning this test can be used) for th e duration of  the COVID-19 declaration under Section 564(b)(1) of the Act, 21 U.S.C. section 360bbb-3(b)(1), unless the authorization is terminated or revoked sooner.    Influenza A by PCR NEGATIVE NEGATIVE Final   Influenza B by PCR NEGATIVE NEGATIVE Final    Comment: (NOTE) The Xpert Xpress SARS-CoV-2/FLU/RSV assay is intended as an aid in  the diagnosis of influenza from Nasopharyngeal swab specimens and  should not be used as a sole basis for treatment. Nasal washings and  aspirates are unacceptable for Xpert Xpress SARS-CoV-2/FLU/RSV  testing. Fact Sheet for  Patients: PinkCheek.be Fact Sheet for Healthcare Providers: GravelBags.it This test is not yet approved or cleared by the [place] FDA and  has been authorized for detection and/or diagnosis of SARS-CoV-2 by  FDA under an Emergency Use Authorization (EUA). This EUA will remain  in effect (meaning this test can be used) for the duration of the  Covid-19 declaration under Section 564(b)(1) of the Act, 21  U.S.C. section 360bbb-3(b)(1), unless the authorization is  terminated or revoked. Performed at [facility], [address] [place], [place] [address]   MRSA PCR Screening     Status: None   Collection Time: 10/31/19 11:11 PM   Specimen: Nasopharyngeal  Result Value Ref Range Status   MRSA by PCR NEGATIVE NEGATIVE Final    Comment:        The GeneXpert MRSA Assay (FDA approved for NASAL specimens only), is one component of a comprehensive MRSA colonization surveillance program. It is not intended to diagnose MRSA infection nor to guide or monitor treatment for MRSA infections. Performed at [facility], [address] [place], [place] [address]      Coagulation Studies: No results for input(s): LABPROT, INR in the last 72 hours.  Urinalysis: No results for input(s): COLORURINE, LABSPEC, PHURINE, GLUCOSEU, HGBUR, BILIRUBINUR, KETONESUR, PROTEINUR, UROBILINOGEN, NITRITE, LEUKOCYTESUR in the last 72 hours.  Invalid input(s): APPERANCEUR      Imaging: DG Chest 1 View  Result Date: 10/31/2019 CLINICAL DATA:  Shortness of breath EXAM: CHEST  1 VIEW COMPARISON:  August 22, 2019 FINDINGS: There is a well-positioned tunneled dialysis catheter on the right. The heart size is enlarged. Aortic calcifications are noted. There are small to moderate-sized bilateral pleural effusions. There is vascular congestion with early developing pulmonary edema. There is no pneumothorax. No acute osseous  abnormality. IMPRESSION: 1. Cardiomegaly with vascular congestion and developing pulmonary edema. 2. Small to moderate-sized bilateral pleural effusions. 3. Well-positioned tunneled dialysis catheter. Electronically Signed   By: Constance Holster M.D.   On: 10/31/2019 19:34      Assessment & Plan: Pt is a 39 y.o. Caucasian  female with insulin-dependent diabetes, end-stage renal disease, peripheral vascular disease, bilateral BKA, hypertension, anxiety, depression, pulmonary hypertension by echo January 2021 was admitted on 10/31/2019 with Acute heart failure ([place]) [I50.9] ESRD on dialysis ([place]) [N18.6, Z99.2] Chest pain [R07.9] Other hypervolemia [E87.79]  1.  End-stage renal disease We will arrange for hemodialysis today We will plan for another treatment tomorrow Start discharge planning for outpatient dialysis unit.  Patient  pathways program notified  2.  Anemia of chronic kidney disease Epogen with hemodialysis  3.  Volume overload with lower extremity edema, pulmonary edema Fluid removal with dialysis as tolerated 2D echo from January 21 shows moderately elevated pulmonary artery systolic pressure suggesting pulmonary hypertension, LVEF 45 to 50%  4.  Covid positive diagnosed November 08, 2019 Airborne precautions Treatment as per internal medicine team  5.  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 Any transcription errors are unintentional

## 2019-11-01 NOTE — Progress Notes (Signed)
*  PRELIMINARY RESULTS* Echocardiogram 2D Echocardiogram has been performed.  Sherrie Sport 11/01/2019, 10:49 AM

## 2019-11-02 ENCOUNTER — Other Ambulatory Visit (INDEPENDENT_AMBULATORY_CARE_PROVIDER_SITE_OTHER): Payer: Self-pay | Admitting: Vascular Surgery

## 2019-11-02 ENCOUNTER — Ambulatory Visit: Payer: Medicare Other

## 2019-11-02 LAB — BASIC METABOLIC PANEL
Anion gap: 9 (ref 5–15)
BUN: 67 mg/dL — ABNORMAL HIGH (ref 6–20)
CO2: 14 mmol/L — ABNORMAL LOW (ref 22–32)
Calcium: 8.1 mg/dL — ABNORMAL LOW (ref 8.9–10.3)
Chloride: 116 mmol/L — ABNORMAL HIGH (ref 98–111)
Creatinine, Ser: 8.05 mg/dL — ABNORMAL HIGH (ref 0.44–1.00)
GFR calc Af Amer: 7 mL/min — ABNORMAL LOW (ref >60)
GFR calc non Af Amer: 6 mL/min — ABNORMAL LOW (ref >60)
Glucose, Bld: 73 mg/dL (ref 70–99)
Potassium: 5.1 mmol/L (ref 3.5–5.1)
Sodium: 139 mmol/L (ref 135–145)

## 2019-11-02 LAB — GLUCOSE, CAPILLARY
Glucose-Capillary: 61 mg/dL — ABNORMAL LOW (ref 70–99)
Glucose-Capillary: 66 mg/dL — ABNORMAL LOW (ref 70–99)
Glucose-Capillary: 136 mg/dL — ABNORMAL HIGH (ref 70–99)
Glucose-Capillary: 65 mg/dL — ABNORMAL LOW (ref 70–99)
Glucose-Capillary: 76 mg/dL (ref 70–99)
Glucose-Capillary: 84 mg/dL (ref 70–99)

## 2019-11-02 MED ORDER — OXYCODONE HCL 5 MG PO TABS
10.0000 mg | ORAL_TABLET | Freq: Four times a day (QID) | ORAL | Status: DC | PRN
  Administered 2019-11-03 – 2019-11-08 (×16): 10 mg via ORAL
  Filled 2019-11-02 (×20): qty 2

## 2019-11-02 MED ORDER — HEPARIN SODIUM (PORCINE) 5000 UNIT/ML IJ SOLN
INTRAMUSCULAR | Status: AC
  Filled 2019-11-02: qty 4

## 2019-11-02 MED ORDER — DEXTROSE 50 % IV SOLN
INTRAVENOUS | Status: AC
  Filled 2019-11-02: qty 50

## 2019-11-02 MED ORDER — DEXTROSE 50 % IV SOLN: 1.0000 | Freq: Once | INTRAVENOUS | Status: AC

## 2019-11-02 MED ORDER — HYDRALAZINE HCL 50 MG PO TABS
25.0000 mg | ORAL_TABLET | Freq: Four times a day (QID) | ORAL | Status: DC | PRN
  Administered 2019-11-03 – 2019-11-04 (×2): 25 mg via ORAL
  Filled 2019-11-02 (×3): qty 1

## 2019-11-02 MED ORDER — CALCIUM ACETATE (PHOS BINDER) 667 MG PO CAPS
1334.0000 mg | ORAL_CAPSULE | Freq: Three times a day (TID) | ORAL | Status: DC
  Administered 2019-11-03 – 2019-11-08 (×14): 1334 mg via ORAL
  Filled 2019-11-02 (×19): qty 2

## 2019-11-02 MED ORDER — SODIUM CHLORIDE 0.9 % IV SOLN
5.0000 mg | Freq: Once | INTRAVENOUS | Status: AC
  Administered 2019-11-02: 5 mg via INTRAVENOUS
  Filled 2019-11-02: qty 5

## 2019-11-02 NOTE — Progress Notes (Signed)
[facility], [place] 11/02/19  Subjective:   LOS: 2 01/21 0701 - 01/22 0700 In: -  Out: 66 [Urine:600]  Last HD was attempted yesterday but was not able to be completed because of clotted catheter Vascular surgery team has been consulted for declot of catheter with TPA  Objective:  Vital signs in last 24 hours:  Temp:  [97.5 F (36.4 C)-97.6 F (36.4 C)] 97.6 F (36.4 C) (01/22 0803) Pulse Rate:  [77-87] 79 (01/22 1330) Resp:  [16-18] 18 (01/22 1330) BP: (116-146)/(82-104) 127/92 (01/22 1330) SpO2:  [97 %-100 %] 100 % (01/22 1330) Weight:  [81.2 kg-81.6 kg] 81.2 kg (01/22 0506)  Weight change: 9.072 kg Filed Weights   11/01/19 0405 11/01/19 2204 11/02/19 0506  Weight: 72.3 kg 81.6 kg 81.2 kg    Intake/Output:    Intake/Output Summary (Last 24 hours) at 11/02/2019 1357 Last data filed at 11/02/2019 1252 Gross per 24 hour  Intake --  Output 750 ml  Net -750 ml    Physical Exam: General:  No acute distress, laying in the bed  HEENT  anicteric, moist oral mucous membranes  Lungs:  Normal breathing effort, clear to auscultation  Heart::  No rub or gallop  Abdomen:  Soft, mildly distended, pitting edema over back  Extremities:  Bilateral leg amputations  Neurologic:  Alert, able to answer questions  Skin:  Prurigo nodules over abdomen and back  Access:  Right IJ PermCath      Basic Metabolic Panel:  Recent Labs  Lab 10/31/19 1848 11/01/19 0637 11/02/19 0643  NA 138 142 139  K 4.5 4.5 5.1  CL 113* 117* 116*  CO2 14* 15* 14*  GLUCOSE 142* 80 73  BUN 62* 63* 67*  CREATININE 7.63* 7.75* 8.05*  CALCIUM 8.0* 8.1* 8.1*  MG 2.0  --   --   PHOS 6.6*  --   --      CBC: Recent Labs  Lab 10/31/19 1848  WBC 7.6  NEUTROABS 4.5  HGB 8.3*  HCT 26.4*  MCV 95.0  PLT 144*      Lab Results  Component Value Date   HEPBSAG NON REACTIVE 11/01/2019   HEPBSAB NON REACTIVE 11/01/2019      Microbiology:  Recent Results (from  the past 240 hour(s))  Respiratory Panel by RT PCR (Flu A&B, Covid) - Nasopharyngeal Swab     Status: Abnormal   Collection Time: 10/31/19  8:56 PM   Specimen: Nasopharyngeal Swab  Result Value Ref Range Status   SARS Coronavirus 2 by RT PCR POSITIVE (A) NEGATIVE Final    Comment: RESULT CALLED TO, READ BACK BY AND VERIFIED WITH: Enis Gash SLADE 10/31/19 @ 2218 [place] (NOTE) SARS-CoV-2 target nucleic acids are DETECTED. SARS-CoV-2 RNA is generally detectable in upper respiratory specimens  during the acute phase of infection. Positive results are indicative of the presence of the identified virus, but do not rule out bacterial infection or co-infection with other pathogens not detected by the test. Clinical correlation with patient history and other diagnostic information is necessary to determine patient infection status. The expected result is Negative. Fact Sheet for Patients:  PinkCheek.be Fact Sheet for Healthcare Providers: GravelBags.it This test is not yet approved or cleared by the [place] FDA and  has been authorized for detection and/or diagnosis of SARS-CoV-2 by FDA under an Emergency Use Authorization (EUA).  This EUA will remain in effect (meaning this test can be used) for th e duration of  the COVID-19 declaration  under Section 564(b)(1) of the Act, 21 U.S.C. section 360bbb-3(b)(1), unless the authorization is terminated or revoked sooner.    Influenza A by PCR NEGATIVE NEGATIVE Final   Influenza B by PCR NEGATIVE NEGATIVE Final    Comment: (NOTE) The Xpert Xpress SARS-CoV-2/FLU/RSV assay is intended as an aid in  the diagnosis of influenza from Nasopharyngeal swab specimens and  should not be used as a sole basis for treatment. Nasal washings and  aspirates are unacceptable for Xpert Xpress SARS-CoV-2/FLU/RSV  testing. Fact Sheet for Patients: PinkCheek.be Fact Sheet for  Healthcare Providers: GravelBags.it This test is not yet approved or cleared by the [place] FDA and  has been authorized for detection and/or diagnosis of SARS-CoV-2 by  FDA under an Emergency Use Authorization (EUA). This EUA will remain  in effect (meaning this test can be used) for the duration of the  Covid-19 declaration under Section 564(b)(1) of the Act, 21  U.S.C. section 360bbb-3(b)(1), unless the authorization is  terminated or revoked. Performed at [facility], [address] [place], [place] [address]   MRSA PCR Screening     Status: None   Collection Time: 10/31/19 11:11 PM   Specimen: Nasopharyngeal  Result Value Ref Range Status   MRSA by PCR NEGATIVE NEGATIVE Final    Comment:        The GeneXpert MRSA Assay (FDA approved for NASAL specimens only), is one component of a comprehensive MRSA colonization surveillance program. It is not intended to diagnose MRSA infection nor to guide or monitor treatment for MRSA infections. Performed at [facility], [address] [place], [place] [address]     Coagulation Studies: No results for input(s): LABPROT, INR in the last 72 hours.  Urinalysis: No results for input(s): COLORURINE, LABSPEC, PHURINE, GLUCOSEU, HGBUR, BILIRUBINUR, KETONESUR, PROTEINUR, UROBILINOGEN, NITRITE, LEUKOCYTESUR in the last 72 hours.  Invalid input(s): APPERANCEUR    Imaging: DG Chest 1 View  Result Date: 10/31/2019 CLINICAL DATA:  Shortness of breath EXAM: CHEST  1 VIEW COMPARISON:  August 22, 2019 FINDINGS: There is a well-positioned tunneled dialysis catheter on the right. The heart size is enlarged. Aortic calcifications are noted. There are small to moderate-sized bilateral pleural effusions. There is vascular congestion with early developing pulmonary edema. There is no pneumothorax. No acute osseous abnormality. IMPRESSION: 1. Cardiomegaly with vascular congestion and developing  pulmonary edema. 2. Small to moderate-sized bilateral pleural effusions. 3. Well-positioned tunneled dialysis catheter. Electronically Signed   By: Constance Holster M.D.   On: 10/31/2019 19:34   ECHOCARDIOGRAM COMPLETE  Result Date: 11/01/2019   ECHOCARDIOGRAM REPORT   Patient Name:   Kathy Phelps Date of Exam: 11/01/2019 Medical Rec #:  [id number]       Height:       69.0 in Accession #:    [id number]      Weight:       159.5 lb Date of Birth:  1981/08/22        BSA:          1.88 m Patient Age:    12 years        BP:           152/99 mmHg Patient Gender: F               HR:           94 bpm. Exam Location:  [facility] Procedure: 2D Echo, Color Doppler and Cardiac Doppler Indications:     CHF-cute diastolic [id number].[place]  History:         Patient has prior history of Echocardiogram examinations, most                  recent 08/20/2019. Stroke; Risk Factors:Hypertension and                  Diabetes. Schizophrenia.  Sonographer:     Sherrie Sport RDCS (AE) Referring Phys:  [id number] Athena Masse Diagnosing Phys: Yolonda Kida MD IMPRESSIONS  1. Left ventricular ejection fraction, by visual estimation, is 45 to 50%. The left ventricle has low normal function. There is mildly increased left ventricular hypertrophy.  2. The left ventricle has no regional wall motion abnormalities.  3. Global right ventricle has normal systolic function.The right ventricular size is normal. No increase in right ventricular wall thickness.  4. Left atrial size was normal.  5. Right atrial size was normal.  6. The mitral valve is normal in structure. No evidence of mitral valve regurgitation.  7. The tricuspid valve is normal in structure.  8. The tricuspid valve is normal in structure. Tricuspid valve regurgitation is trivial.  9. The aortic valve is normal in structure. Aortic valve regurgitation is not visualized. 10. The pulmonic valve was grossly normal. Pulmonic valve regurgitation is not visualized. 11. Moderately elevated pulmonary  artery systolic pressure. FINDINGS  Left Ventricle: Left ventricular ejection fraction, by visual estimation, is 45 to 50%. The left ventricle has low normal function. The left ventricle has no regional wall motion abnormalities. There is mildly increased left ventricular hypertrophy. Left ventricular diastolic parameters were normal. Right Ventricle: The right ventricular size is normal. No increase in right ventricular wall thickness. Global RV systolic function is has normal systolic function. The tricuspid regurgitant velocity is 3.06 m/s, and with an assumed right atrial pressure  of 10 mmHg, the estimated right ventricular systolic pressure is moderately elevated at 47.5 mmHg. Left Atrium: Left atrial size was normal in size. Right Atrium: Right atrial size was normal in size Pericardium: There is no evidence of pericardial effusion. Mitral Valve: The mitral valve is normal in structure. No evidence of mitral valve regurgitation. Tricuspid Valve: The tricuspid valve is normal in structure. Tricuspid valve regurgitation is trivial. Aortic Valve: The aortic valve is normal in structure. Aortic valve regurgitation is not visualized. Aortic valve mean gradient measures 4.0 mmHg. Aortic valve peak gradient measures 5.9 mmHg. Aortic valve area, by VTI measures 2.33 cm. Pulmonic Valve: The pulmonic valve was grossly normal. Pulmonic valve regurgitation is not visualized. Pulmonic regurgitation is not visualized. Aorta: The aortic root is normal in size and structure. IAS/Shunts: No atrial level shunt detected by color flow Doppler.  LEFT VENTRICLE PLAX 2D LVIDd:         5.77 cm  Diastology LVIDs:         3.79 cm  LV e' lateral:   6.20 cm/s LV PW:         1.71 cm  LV E/e' lateral: 18.4 LV IVS:        0.97 cm  LV e' medial:    5.11 cm/s LVOT diam:     2.00 cm  LV E/e' medial:  22.3 LV SV:         103 ml LV SV Index:   54.77 LVOT Area:     3.14 cm  RIGHT VENTRICLE RV Basal diam:  4.51 cm LEFT ATRIUM           Index  RIGHT ATRIUM           Index LA diam:      4.30 cm 2.29 cm/m  RA Area:     12.80 cm LA Vol (A2C): 64.5 ml 34.37 ml/m RA Volume:   29.30 ml  15.61 ml/m LA Vol (A4C): 51.0 ml 27.18 ml/m  AORTIC VALVE                   PULMONIC VALVE AV Area (Vmax):    2.73 cm    PV Vmax:        0.61 m/s AV Area (Vmean):   2.41 cm    PV Peak grad:   1.5 mmHg AV Area (VTI):     2.33 cm    RVOT Peak grad: 2 mmHg AV Vmax:           121.00 cm/s AV Vmean:          90.100 cm/s AV VTI:            0.193 m AV Peak Grad:      5.9 mmHg AV Mean Grad:      4.0 mmHg LVOT Vmax:         105.00 cm/s LVOT Vmean:        69.100 cm/s LVOT VTI:          0.143 m LVOT/AV VTI ratio: 0.74  AORTA Ao Root diam: 3.30 cm MITRAL VALVE                         TRICUSPID VALVE MV Area (PHT): 6.32 cm              TR Peak grad:   37.5 mmHg MV PHT:        34.80 msec            TR Vmax:        316.00 cm/s MV Decel Time: 120 msec MV E velocity: 114.00 cm/s 103 cm/s  SHUNTS MV A velocity: 73.70 cm/s  70.3 cm/s Systemic VTI:  0.14 m MV E/A ratio:  1.55        1.5       Systemic Diam: 2.00 cm  Dwayne Prince Rome MD Electronically signed by Yolonda Kida MD Signature Date/Time: 11/01/2019/2:06:52 PM    Final      Medications:   . sodium chloride    . alteplase (TPA-ACTIVASE) *DIALYSIS CATH* 5 mg infusion 5 mg (11/02/19 1240)  . alteplase (TPA-ACTIVASE) *DIALYSIS CATH* 5 mg infusion 5 mg (11/02/19 1240)   . alteplase  2 mg Intracatheter Once  . alteplase  2 mg Intracatheter Once  . amLODipine  5 mg Oral Daily  . Chlorhexidine Gluconate Cloth  6 each Topical Daily  . epoetin (EPOGEN/PROCRIT) injection  4,000 Units Intravenous Once  . furosemide  80 mg Intravenous BID  . gabapentin  800 mg Oral BID  . heparin  5,000 Units Subcutaneous Q8H  . hydrocerin   Topical BID  . insulin aspart  0-5 Units Subcutaneous QHS  . insulin aspart  0-6 Units Subcutaneous TID WC  . lurasidone  60 mg Oral QHS  . oxyCODONE  5 mg Oral QID  . sodium chloride flush  3  mL Intravenous Q12H   sodium chloride, acetaminophen, ALPRAZolam, diphenhydrAMINE, heparin, ondansetron (ZOFRAN) IV, sodium chloride flush  Assessment/ Plan:  39 y.o. 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 ESRD with LE edema Hemodialysis today after catheter has been declotted. could not get HD yesterday Discharge disposition is difficult because of transportation issues.  Per report, patient was discharged from her home unit. Volume removal with dialysis as tolerated Agree with diuretic use.  Currently getting IV.  Consider changing to oral torsemide 100 mg daily  #. Anemia of CKD  Lab Results  Component Value Date   HGB 8.3 (L) 10/31/2019  Low dose EPO with HD  #. SHPTH     Component Value Date/Time   PTH NOT PERFORMED 06/16/2019 [id number]   Lab Results  Component Value Date   PHOS 6.6 (H) 10/31/2019   Monitor calcium and phos level during this admission Low phosphorus diet [place] with meals     LOS: [place] 1/22/20211:57 PM  [place], [place]

## 2019-11-02 NOTE — Progress Notes (Signed)
CRITICAL VALUE ALERT  Critical Value:  Blood sugar 65  Date & Time Notied:  11/02/2019 11:37 AM   Provider Notified: Dr. Mal Misty  Orders Received/Actions taken: verbal order for an amp of d50, recheck sugar afterwards.

## 2019-11-02 NOTE — Progress Notes (Signed)
Pt request to remain off tele until after she eats. CCMD notified.

## 2019-11-02 NOTE — Progress Notes (Signed)
Report called to 1C

## 2019-11-02 NOTE — Progress Notes (Signed)
   11/02/19 1815  Neurological  Level of Consciousness Alert  Orientation Level Oriented X4  Respiratory  Respiratory Pattern Regular;Unlabored  Bilateral Breath Sounds Diminished  Cardiac  Pulse Regular  Heart Sounds S1, S2  ECG Monitor Yes  STABLE FOR HD TX NO C/OS CVC WDL UFG 1L

## 2019-11-02 NOTE — Consult Note (Signed)
[facility]   OPERATIVE NOTE   PROCEDURE: 1. Continuous infusion of TPA for nonfunctional tunneled hemodialysis catheter  PRE-OPERATIVE DIAGNOSIS:  1. ESRD 2. Nonfunctional tunneled hemodialysis catheter  POST-OPERATIVE DIAGNOSIS:  1. ESRD 2. Nonfunctional tunneled hemodialysis catheter  PHYSICIAN ASSISTANT: Hezzie Bump, PA-C  SUPERVISING SURGEON: Hortencia Pilar, MD  ANESTHESIA: None  ESTIMATED BLOOD LOSS: Minimal  FINDING(S): 1.  Catheters withdrew blood well and flushed easily after infusion  SPECIMEN(S):  None  INDICATIONS:   Kathy Phelps is a 39 y.o. female who presents with a nonfunctional tunneled dialysis catheter.  We are performing a TPA infusion to try to open the catheter and salvage this for use.  Risks and benefits were discussed.  DESCRIPTION: After obtaining full informed written consent, the patient is brought to the vascular and interventional radiology area. 2 mg of alteplase is infused over a two-hour period through both lumens of the tunneled hemodialysis catheter. At the conclusion of the infusion, we sterilely withdrew blood and flushed easily with sterile saline in both lumens. A concentrated heparin solution was then placed in both lumens and sterile caps were placed. The patient tolerated the procedure well.  COMPLICATIONS: None  CONDITION: Stable  [name] [name]  11/02/2019, 3:12 PM

## 2019-11-02 NOTE — Progress Notes (Signed)
Progress Note    Kathy Phelps  [id number] DOB: [birth year]  DOA: 10/31/2019 PCP: Davonna Belling, MD      Brief Narrative:    Medical records reviewed and are as summarized below:  Kathy Phelps is an 39 y.o. 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  She states that she missed dialysis for the past 2 months because she did not have a ride.  She is short of breath only when she lays flat but feels fine when she is sitting up.  She denies cough, chest pain, fever or chills.  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   End-stage renal disease with fluid overload/acute systolic heart failure secondary to missed dialysis/dysfunctional permacath: 2D echo showed EF estimated at 45 to 50%, mild LVH.  She was seen by vascular surgeon on continuous infusion of TPA of tunneled hemodialysis catheter was performed successfully.  Follow-up with nephrologist for hemodialysis.  Diabetes mellitus with hypoglycemia: She is not on any hypoglycemic agents in the hospital but her blood sugar dropped to 61 today.  She was given 50% dextrose for hypoglycemia..  Hypertensive urgency: BP is better.  Continue antihypertensives.  Generalized papular  rash: Etiology unclear.  Recommended outpatient follow-up with dermatologist.  COVID-19 infection: Asymptomatic.  She is tolerating room air.  No indication for treatment or remdesivir or steroids at this time.  Bipolar disorder: Continue Latuda  PVD: Status post bilateral BKA   Family Communication/Anticipated D/C date and plan/Code Status   DVT prophylaxis: Heparin Code Status: Full code Family Communication: Plan discussed with the patient Disposition Plan: Possible discharge to home in 1 to 2 days      Subjective:   She said she could not complete dialysis because of dysfunctional permacath.  She still feels swollen in her abdomen, lower back and thighs.  No shortness of breath, cough or chest pain.  Objective:    Vitals:   11/02/19 1330 11/02/19 1400 11/02/19 1430 11/02/19 1500  BP: (!) 127/92 137/86 (!) 147/83 (!) 147/83  Pulse: 79 75 82 87  Resp: 18 18 18 18   Temp:      TempSrc:      SpO2: 100% 97% 100% 100%  Weight:        Intake/Output Summary (Last 24 hours) at 11/02/2019 1555 Last data filed at 11/02/2019 1252 Gross per 24 hour  Intake --  Output 250 ml  Net -250 ml   Filed Weights   11/01/19 0405 11/01/19 2204 11/02/19 0506  Weight: 72.3 kg 81.6 kg 81.2 kg    Exam:  GEN: NAD SKIN: Diffuse papular rash on the chest, back and thighs EYES: No pallor or icterus ENT: Moist mucous membranes  CV: RRR PULM: No wheezing or rales heard  ABD: soft, abdominal distention, NT, +BS CNS: AAO x 3, non focal EXT: Bilateral BKA.  Swelling on bilateral thighs but no tenderness or erythema.   Data Reviewed:   I have personally reviewed following labs and imaging studies:  Labs: Labs show the following:   Basic Metabolic Panel: Recent Labs  Lab 10/31/19 1848 10/31/19 1848 11/01/19 0637 11/02/19 0643  NA 138  --  142 139  K 4.5   < > 4.5 5.1  CL 113*  --  117* 116*  CO2 14*  --  15* 14*  GLUCOSE 142*  --  80 73  BUN 62*  --  63* 67*  CREATININE  7.63*  --  7.75* 8.05*  CALCIUM 8.0*  --  8.1* 8.1*  MG 2.0  --   --   --   PHOS 6.6*  --   --   --    < > = values in this interval not displayed.   GFR Estimated Creatinine Clearance: 10.6 mL/min (A) (by C-G formula based on SCr of 8.05 mg/dL (H)). Liver Function Tests: Recent Labs  Lab 10/31/19 1848  AST 14*  ALT 6  ALKPHOS 87  BILITOT 0.5  PROT 6.3*  ALBUMIN 2.8*   No results for input(s): LIPASE, AMYLASE in the last 168 hours. No results for input(s): AMMONIA in the last 168 hours. Coagulation profile No results for input(s): INR, PROTIME in the last 168 hours.  CBC: Recent Labs  Lab 10/31/19 1848  WBC 7.6  NEUTROABS 4.5  HGB 8.3*  HCT 26.4*  MCV 95.0  PLT 144*   Cardiac Enzymes: No results for input(s): CKTOTAL, CKMB, CKMBINDEX, TROPONINI in the last 168 hours. BNP (last 3 results) No results for input(s): PROBNP in the last 8760 hours. CBG: Recent Labs  Lab 11/01/19 1645 11/02/19 0805 11/02/19 0835 11/02/19 1131 11/02/19 1214  GLUCAP 81 61* 66* 65* 136*   D-Dimer: No results for input(s): DDIMER in the last 72 hours. Hgb A1c: Recent Labs    11/01/19 0637  HGBA1C 5.6   Lipid Profile: No results for input(s): CHOL, HDL, LDLCALC, TRIG, CHOLHDL, LDLDIRECT in the last 72 hours. Thyroid function studies: No results for input(s): TSH, T4TOTAL, T3FREE, THYROIDAB in the last 72 hours.  Invalid input(s): FREET3 Anemia work up: No results for input(s): VITAMINB12, FOLATE, FERRITIN, TIBC, IRON, RETICCTPCT in the last 72 hours. Sepsis Labs: Recent Labs  Lab 10/31/19 1848  WBC 7.6    Microbiology Recent Results (from the past 240 hour(s))  Respiratory Panel by RT PCR (Flu A&B, Covid) - Nasopharyngeal Swab     Status: Abnormal   Collection Time: 10/31/19  8:56 PM   Specimen: Nasopharyngeal Swab  Result Value Ref Range Status   SARS Coronavirus 2 by RT PCR POSITIVE (A) NEGATIVE Final    Comment: RESULT CALLED TO, READ BACK BY AND VERIFIED  WITH: Enis Gash SLADE 10/31/19 @ 2218 [place] (NOTE) SARS-CoV-2 target nucleic acids are DETECTED. SARS-CoV-2 RNA is generally detectable in upper respiratory specimens  during the acute phase of infection. Positive results are indicative of the presence of the identified virus, but do not rule out bacterial infection or co-infection with other pathogens not detected by the test. Clinical correlation with patient history and other diagnostic information is necessary to determine patient infection status. The expected result is Negative. Fact Sheet for Patients:  PinkCheek.be Fact Sheet for Healthcare Providers: GravelBags.it This test is not yet approved or cleared by the [place] and  has been authorized  for detection and/or diagnosis of SARS-CoV-2 by FDA under an Emergency Use Authorization (EUA).  This EUA will remain in effect (meaning this test can be used) for th e duration of  the COVID-19 declaration under Section 564(b)(1) of the Act, 21 U.S.C. section 360bbb-3(b)(1), unless the authorization is terminated or revoked sooner.    Influenza A by PCR NEGATIVE NEGATIVE Final   Influenza B by PCR NEGATIVE NEGATIVE Final    Comment: (NOTE) The Xpert Xpress SARS-CoV-2/FLU/RSV assay is intended as an aid in  the diagnosis of influenza from Nasopharyngeal swab specimens and  should not be used as a sole basis for treatment. Nasal washings and  aspirates are unacceptable for Xpert Xpress SARS-CoV-2/FLU/RSV  testing. Fact Sheet for Patients: PinkCheek.be Fact Sheet for Healthcare Providers: GravelBags.it This test is not yet approved or cleared by the [place] FDA and  has been authorized for detection and/or diagnosis of SARS-CoV-2 by  FDA under an Emergency Use Authorization (EUA). This EUA will remain  in effect (meaning this test can be used) for the duration  of the  Covid-19 declaration under Section 564(b)(1) of the Act, 21  U.S.C. section 360bbb-3(b)(1), unless the authorization is  terminated or revoked. Performed at [facility], [address] [place], [place] [address]   MRSA PCR Screening     Status: None   Collection Time: 10/31/19 11:11 PM   Specimen: Nasopharyngeal  Result Value Ref Range Status   MRSA by PCR NEGATIVE NEGATIVE Final    Comment:        The GeneXpert MRSA Assay (FDA approved for NASAL specimens only), is one component of a comprehensive MRSA colonization surveillance program. It is not intended to diagnose MRSA infection nor to guide or monitor treatment for MRSA infections. Performed at [facility], [address] [place], [place] [address]     Procedures and diagnostic studies:  DG Chest 1 View  Result Date: 10/31/2019 CLINICAL DATA:  Shortness of breath EXAM: CHEST  1 VIEW COMPARISON:  August 22, 2019 FINDINGS: There is a well-positioned tunneled dialysis catheter on the right. The heart size is enlarged. Aortic calcifications are noted. There are small to moderate-sized bilateral pleural effusions. There is vascular congestion with early developing pulmonary edema. There is no pneumothorax. No acute osseous abnormality. IMPRESSION: 1. Cardiomegaly with vascular congestion and developing pulmonary edema. 2. Small to moderate-sized bilateral pleural effusions. 3. Well-positioned tunneled dialysis catheter. Electronically Signed   By: Constance Holster M.D.   On: 10/31/2019 19:34   ECHOCARDIOGRAM COMPLETE  Result Date: 11/01/2019   ECHOCARDIOGRAM REPORT   Patient Name:   SOFIE GUINYARD Date of Exam: 11/01/2019 Medical Rec #:  [id number]       Height:       69.0 in Accession #:    [id number]      Weight:       159.5 lb Date of Birth:  [birth year]        BSA:          1.88 m Patient Age:    62 years        BP:           152/99 mmHg Patient Gender: F               HR:           94 bpm. Exam  Location:  [facility] Procedure: 2D Echo, Color Doppler and Cardiac Doppler Indications:     CHF-cute diastolic [id number]  History:  Patient has prior history of Echocardiogram examinations, most                  recent 08/20/2019. Stroke; Risk Factors:Hypertension and                  Diabetes. Schizophrenia.  Sonographer:     Sherrie Sport RDCS (AE) Referring Phys:  [id number] Athena Masse Diagnosing Phys: Yolonda Kida MD IMPRESSIONS  1. Left ventricular ejection fraction, by visual estimation, is 45 to 50%. The left ventricle has low normal function. There is mildly increased left ventricular hypertrophy.  2. The left ventricle has no regional wall motion abnormalities.  3. Global right ventricle has normal systolic function.The right ventricular size is normal. No increase in right ventricular wall thickness.  4. Left atrial size was normal.  5. Right atrial size was normal.  6. The mitral valve is normal in structure. No evidence of mitral valve regurgitation.  7. The tricuspid valve is normal in structure.  8. The tricuspid valve is normal in structure. Tricuspid valve regurgitation is trivial.  9. The aortic valve is normal in structure. Aortic valve regurgitation is not visualized. 10. The pulmonic valve was grossly normal. Pulmonic valve regurgitation is not visualized. 11. Moderately elevated pulmonary artery systolic pressure. FINDINGS  Left Ventricle: Left ventricular ejection fraction, by visual estimation, is 45 to 50%. The left ventricle has low normal function. The left ventricle has no regional wall motion abnormalities. There is mildly increased left ventricular hypertrophy. Left ventricular diastolic parameters were normal. Right Ventricle: The right ventricular size is normal. No increase in right ventricular wall thickness. Global RV systolic function is has normal systolic function. The tricuspid regurgitant velocity is 3.06 m/s, and with an assumed right atrial pressure  of 10 mmHg, the  estimated right ventricular systolic pressure is moderately elevated at 47.5 mmHg. Left Atrium: Left atrial size was normal in size. Right Atrium: Right atrial size was normal in size Pericardium: There is no evidence of pericardial effusion. Mitral Valve: The mitral valve is normal in structure. No evidence of mitral valve regurgitation. Tricuspid Valve: The tricuspid valve is normal in structure. Tricuspid valve regurgitation is trivial. Aortic Valve: The aortic valve is normal in structure. Aortic valve regurgitation is not visualized. Aortic valve mean gradient measures 4.0 mmHg. Aortic valve peak gradient measures 5.9 mmHg. Aortic valve area, by VTI measures 2.33 cm. Pulmonic Valve: The pulmonic valve was grossly normal. Pulmonic valve regurgitation is not visualized. Pulmonic regurgitation is not visualized. Aorta: The aortic root is normal in size and structure. IAS/Shunts: No atrial level shunt detected by color flow Doppler.  LEFT VENTRICLE PLAX 2D LVIDd:         5.77 cm  Diastology LVIDs:         3.79 cm  LV e' lateral:   6.20 cm/s LV PW:         1.71 cm  LV E/e' lateral: 18.4 LV IVS:        0.97 cm  LV e' medial:    5.11 cm/s LVOT diam:     2.00 cm  LV E/e' medial:  22.3 LV SV:         103 ml LV SV Index:   54.77 LVOT Area:     3.14 cm  RIGHT VENTRICLE RV Basal diam:  4.51 cm LEFT ATRIUM           Index       RIGHT ATRIUM  Index LA diam:      4.30 cm 2.29 cm/m  RA Area:     12.80 cm LA Vol (A2C): 64.5 ml 34.37 ml/m RA Volume:   29.30 ml  15.61 ml/m LA Vol (A4C): 51.0 ml 27.18 ml/m  AORTIC VALVE                   PULMONIC VALVE AV Area (Vmax):    2.73 cm    PV Vmax:        0.61 m/s AV Area (Vmean):   2.41 cm    PV Peak grad:   1.5 mmHg AV Area (VTI):     2.33 cm    RVOT Peak grad: 2 mmHg AV Vmax:           121.00 cm/s AV Vmean:          90.100 cm/s AV VTI:            0.193 m AV Peak Grad:      5.9 mmHg AV Mean Grad:      4.0 mmHg LVOT Vmax:         105.00 cm/s LVOT Vmean:        69.100  cm/s LVOT VTI:          0.143 m LVOT/AV VTI ratio: 0.74  AORTA Ao Root diam: 3.30 cm MITRAL VALVE                         TRICUSPID VALVE MV Area (PHT): 6.32 cm              TR Peak grad:   37.5 mmHg MV PHT:        34.80 msec            TR Vmax:        316.00 cm/s MV Decel Time: 120 msec MV E velocity: 114.00 cm/s 103 cm/s  SHUNTS MV A velocity: 73.70 cm/s  70.3 cm/s Systemic VTI:  0.14 m MV E/A ratio:  1.55        1.5       Systemic Diam: 2.00 cm  Dwayne Prince Rome MD Electronically signed by Yolonda Kida MD Signature Date/Time: 11/01/2019/2:06:52 PM    Final     Medications:   . alteplase  2 mg Intracatheter Once  . alteplase  2 mg Intracatheter Once  . amLODipine  5 mg Oral Daily  . calcium acetate  1,334 mg Oral TID WC  . Chlorhexidine Gluconate Cloth  6 each Topical Daily  . epoetin (EPOGEN/PROCRIT) injection  4,000 Units Intravenous Once  . furosemide  80 mg Intravenous BID  . gabapentin  800 mg Oral BID  . heparin      . heparin  5,000 Units Subcutaneous Q8H  . hydrocerin   Topical BID  . insulin aspart  0-5 Units Subcutaneous QHS  . insulin aspart  0-6 Units Subcutaneous TID WC  . lurasidone  60 mg Oral QHS  . oxyCODONE  5 mg Oral QID  . sodium chloride flush  3 mL Intravenous Q12H   Continuous Infusions: . sodium chloride    . alteplase (TPA-ACTIVASE) *DIALYSIS CATH* 5 mg infusion 5 mg (11/02/19 1240)  . alteplase (TPA-ACTIVASE) *DIALYSIS CATH* 5 mg infusion 5 mg (11/02/19 1240)     LOS: 2 days   Hortence Charter  [facility]   *Please refer to [email or web address], password [email or web address] to get updated schedule on who will round on this patient,  as hospitalists switch teams weekly. If 7PM-7AM, please contact night-coverage at [email or web address], password [email or web address] for any overnight needs.  11/02/2019, 3:55 PM

## 2019-11-02 NOTE — Progress Notes (Signed)
   11/02/19 2105  Neurological  Level of Consciousness Alert  Orientation Level Oriented X4  Respiratory  Respiratory Pattern Regular;Unlabored  Cardiac  Pulse Regular  Heart Sounds S1, S2  ECG Monitor Yes  TOLERATED HD TX WELL NO ISSUES CVC WDL UFG REACHED 1L

## 2019-11-02 NOTE — Care Management Important Message (Signed)
Important Message  Patient Details  Name: Kathy Phelps MRN: [id number] Date of Birth: 07/17/1981   Medicare Important Message Given:  N/A - LOS <3 / Initial given by admissions     Dannette Barbara 11/02/2019, 1:31 PM

## 2019-11-03 DIAGNOSIS — R21 Rash and other nonspecific skin eruption: Secondary | ICD-10-CM

## 2019-11-03 LAB — CBC
HCT: 22.3 % — ABNORMAL LOW (ref 36.0–46.0)
Hemoglobin: 7.3 g/dL — ABNORMAL LOW (ref 12.0–15.0)
MCH: 29.9 pg (ref 26.0–34.0)
MCHC: 32.7 g/dL (ref 30.0–36.0)
MCV: 91.4 fL (ref 80.0–100.0)
Platelets: 119 10*3/uL — ABNORMAL LOW (ref 150–400)
RBC: 2.44 MIL/uL — ABNORMAL LOW (ref 3.87–5.11)
RDW: 14.8 % (ref 11.5–15.5)
WBC: 6.1 10*3/uL (ref 4.0–10.5)
nRBC: 0 % (ref 0.0–0.2)

## 2019-11-03 LAB — GLUCOSE, CAPILLARY
Glucose-Capillary: 93 mg/dL (ref 70–99)
Glucose-Capillary: 92 mg/dL (ref 70–99)
Glucose-Capillary: 101 mg/dL — ABNORMAL HIGH (ref 70–99)
Glucose-Capillary: 89 mg/dL (ref 70–99)

## 2019-11-03 LAB — BASIC METABOLIC PANEL
Anion gap: 8 (ref 5–15)
BUN: 38 mg/dL — ABNORMAL HIGH (ref 6–20)
CO2: 21 mmol/L — ABNORMAL LOW (ref 22–32)
Calcium: 7.6 mg/dL — ABNORMAL LOW (ref 8.9–10.3)
Chloride: 110 mmol/L (ref 98–111)
Creatinine, Ser: 5.41 mg/dL — ABNORMAL HIGH (ref 0.44–1.00)
GFR calc Af Amer: 11 mL/min — ABNORMAL LOW (ref >60)
GFR calc non Af Amer: 9 mL/min — ABNORMAL LOW (ref >60)
Glucose, Bld: 91 mg/dL (ref 70–99)
Potassium: 3.8 mmol/L (ref 3.5–5.1)
Sodium: 139 mmol/L (ref 135–145)

## 2019-11-03 NOTE — Progress Notes (Signed)
Called to update Kathy Phelps who is pts contact. She is very concerned that pt does not have rides to HD and also voiced concerns about the pts home living conditions. Informed that Social work will be involved in case prior to D/C. No further questions or concerns.

## 2019-11-03 NOTE — Progress Notes (Signed)
CCMD notified of pt SPO2 destat into 80's. Pt showed no visible signs of distress.  Pt refused . Upon conversation with pt reported she is supposed to use a non-rebreather at night time. Pt is now 100% 4L on non- rebreather mask. She reports she is not sure of the O2 flow rate and reports non-compliance stating she used oxygen at night "before I lost the machine in one of our moves." Pt also reports a history of COPD.

## 2019-11-03 NOTE — Progress Notes (Signed)
[place], [place] 11/03/19  Subjective:  We confirmed with the patient's prior outpatient dialysis unit that she is no longer a patient there. She apparently only came to 1 or 2 treatments and then did not show up for any further treatments.    Objective:  Vital signs in last 24 hours:  Temp:  [98 F (36.7 C)-98.1 F (36.7 C)] 98.1 F (36.7 C) (01/22 2100) Pulse Rate:  [81-101] 84 (01/23 0541) Resp:  [11-24] 17 (01/23 0541) BP: (134-195)/(59-137) 134/82 (01/23 0938) SpO2:  [94 %-100 %] 100 % (01/23 0541) Weight:  [83.1 kg] 83.1 kg (01/23 0500)  Weight change: 1.453 kg Filed Weights   11/01/19 2204 11/02/19 0506 11/03/19 0500  Weight: 81.6 kg 81.2 kg 83.1 kg    Intake/Output:    Intake/Output Summary (Last 24 hours) at 11/03/2019 1444 Last data filed at 11/03/2019 0931 Gross per 24 hour  Intake 668 ml  Output 1400 ml  Net -732 ml    Physical Exam: General:  No acute distress, laying in the bed  HEENT  anicteric, moist oral mucous membranes  Lungs:  Normal breathing effort, clear to auscultation  Heart::  No rub or gallop  Abdomen:  Soft, BS present  Extremities:  Bilateral leg amputations  Neurologic:  Alert, able to answer questions  Skin:  Prurigo nodules over abdomen and back  Access:  Right IJ PermCath      Basic Metabolic Panel:  Recent Labs  Lab 10/31/19 1848 10/31/19 1848 11/01/19 0637 11/02/19 0643 11/03/19 0502  NA 138  --  142 139 139  K 4.5  --  4.5 5.1 3.8  CL 113*  --  117* 116* 110  CO2 14*  --  15* 14* 21*  GLUCOSE 142*  --  80 73 91  BUN 62*  --  63* 67* 38*  CREATININE 7.63*  --  7.75* 8.05* 5.41*  CALCIUM 8.0*   < > 8.1* 8.1* 7.6*  MG 2.0  --   --   --   --   PHOS 6.6*  --   --   --   --    < > = values in this interval not displayed.     CBC: Recent Labs  Lab 10/31/19 1848 11/03/19 0502  WBC 7.6 6.1  NEUTROABS 4.5  --   HGB 8.3* 7.3*  HCT 26.4* 22.3*  MCV 95.0 91.4  PLT 144* 119*      Lab Results  Component Value Date   HEPBSAG NON REACTIVE 11/01/2019   HEPBSAB NON REACTIVE 11/01/2019      Microbiology:  Recent Results (from the past 240 hour(s))  Respiratory Panel by RT PCR (Flu A&B, Covid) - Nasopharyngeal Swab     Status: Abnormal   Collection Time: 10/31/19  8:56 PM   Specimen: Nasopharyngeal Swab  Result Value Ref Range Status   SARS Coronavirus 2 by RT PCR POSITIVE (A) NEGATIVE Final    Comment: RESULT CALLED TO, READ BACK BY AND VERIFIED WITH: Enis Gash SLADE 10/31/19 @ 2218 [place] (NOTE) SARS-CoV-2 target nucleic acids are DETECTED. SARS-CoV-2 RNA is generally detectable in upper respiratory specimens  during the acute phase of infection. Positive results are indicative of the presence of the identified virus, but do not rule out bacterial infection or co-infection with other pathogens not detected by the test. Clinical correlation with patient history and other diagnostic information is necessary to determine patient infection status. The expected result is Negative. Fact Sheet for Patients:  PinkCheek.be  Fact Sheet for Healthcare Providers: GravelBags.it This test is not yet approved or cleared by the [place] FDA and  has been authorized for detection and/or diagnosis of SARS-CoV-2 by FDA under an Emergency Use Authorization (EUA).  This EUA will remain in effect (meaning this test can be used) for th e duration of  the COVID-19 declaration under Section 564(b)(1) of the Act, 21 U.S.C. section 360bbb-3(b)(1), unless the authorization is terminated or revoked sooner.    Influenza A by PCR NEGATIVE NEGATIVE Final   Influenza B by PCR NEGATIVE NEGATIVE Final    Comment: (NOTE) The Xpert Xpress SARS-CoV-2/FLU/RSV assay is intended as an aid in  the diagnosis of influenza from Nasopharyngeal swab specimens and  should not be used as a sole basis for treatment. Nasal washings and  aspirates  are unacceptable for Xpert Xpress SARS-CoV-2/FLU/RSV  testing. Fact Sheet for Patients: PinkCheek.be Fact Sheet for Healthcare Providers: GravelBags.it This test is not yet approved or cleared by the [place] FDA and  has been authorized for detection and/or diagnosis of SARS-CoV-2 by  FDA under an Emergency Use Authorization (EUA). This EUA will remain  in effect (meaning this test can be used) for the duration of the  Covid-19 declaration under Section 564(b)(1) of the Act, 21  U.S.C. section 360bbb-3(b)(1), unless the authorization is  terminated or revoked. Performed at [facility], [address] [place], [place] [address]   MRSA PCR Screening     Status: None   Collection Time: 10/31/19 11:11 PM   Specimen: Nasopharyngeal  Result Value Ref Range Status   MRSA by PCR NEGATIVE NEGATIVE Final    Comment:        The GeneXpert MRSA Assay (FDA approved for NASAL specimens only), is one component of a comprehensive MRSA colonization surveillance program. It is not intended to diagnose MRSA infection nor to guide or monitor treatment for MRSA infections. Performed at [facility], [address] [place], [place] [address]     Coagulation Studies: No results for input(s): LABPROT, INR in the last 72 hours.  Urinalysis: No results for input(s): COLORURINE, LABSPEC, PHURINE, GLUCOSEU, HGBUR, BILIRUBINUR, KETONESUR, PROTEINUR, UROBILINOGEN, NITRITE, LEUKOCYTESUR in the last 72 hours.  Invalid input(s): APPERANCEUR    Imaging: No results found.   Medications:   . sodium chloride     . alteplase  2 mg Intracatheter Once  . alteplase  2 mg Intracatheter Once  . amLODipine  5 mg Oral Daily  . calcium acetate  1,334 mg Oral TID WC  . Chlorhexidine Gluconate Cloth  6 each Topical Daily  . epoetin (EPOGEN/PROCRIT) injection  4,000 Units Intravenous Once  . furosemide  80 mg Intravenous  BID  . gabapentin  800 mg Oral BID  . heparin  5,000 Units Subcutaneous Q8H  . hydrocerin   Topical BID  . insulin aspart  0-5 Units Subcutaneous QHS  . insulin aspart  0-6 Units Subcutaneous TID WC  . lurasidone  60 mg Oral QHS  . sodium chloride flush  3 mL Intravenous Q12H   sodium chloride, acetaminophen, ALPRAZolam, diphenhydrAMINE, heparin, hydrALAZINE, ondansetron (ZOFRAN) IV, oxyCODONE, sodium chloride flush  Assessment/ Plan:  39 y.o. 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 ESRD with LE edema Patient underwent hemodialysis yesterday.  Tolerated well.  No urgent indication for dialysis again today.  We will plan for dialysis again most likely on Monday.  #. Anemia of CKD  Lab Results  Component Value Date   HGB 7.3 (L) 11/03/2019  We plan to maintain the patient on Epogen during dialysis treatments.  #. SHPTH     Component Value Date/Time   PTH NOT PERFORMED 06/16/2019 0611   Lab Results  Component Value Date   PHOS 6.6 (H) 10/31/2019   Phosphorus high at 6.6 which should begin to come down with ongoing dialysis treatments.     LOS: 3 [place] Freeze 1/23/20212:44 PM  [place], [place]

## 2019-11-03 NOTE — Progress Notes (Signed)
Progress Note    Kathy Phelps  [id number] DOB: 09-Oct-1981  DOA: 10/31/2019 PCP: Davonna Belling, MD      Brief Narrative:    Medical records reviewed and are as summarized below:  Kathy Phelps is an 39 y.o. 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  She states that she missed dialysis for the past 2 months because she did not have a ride.  She is short of breath only when she lays flat but feels fine when she is sitting up.  She denies cough, chest pain, fever or chills.  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   End-stage renal disease with fluid overload/acute systolic heart failure secondary to missed dialysis/dysfunctional permacath: 2D echo showed EF estimated at 45 to 50%, mild LVH.  She was seen by vascular surgeon and continuous infusion of TPA of tunneled hemodialysis catheter was performed successfully on 11/02/2019.  Follow-up with nephrologist for hemodialysis.  She does not have an outpatient hemodialysis center as of yet.  This has to be arranged prior to discharge.  Nephrologist.  Diabetes mellitus with recent hypoglycemia: Encouraged adequate oral intake.  Hypertension: Continue  antihypertensives.  Generalized papular rash: Etiology unclear.  Recommended outpatient follow-up with dermatologist.  COVID-19 infection: Asymptomatic.   Bipolar disorder: Continue Latuda  PVD: Status post bilateral BKA   Family Communication/Anticipated D/C date and plan/Code Status   DVT prophylaxis: Heparin Code Status: Full code Family Communication: Plan discussed with the patient Disposition Plan: Possible discharge to home in 2 to 3 days     Subjective:   She still has some swelling in her thighs.  No cough, chest pain or shortness of breath.  She wants to go home  Objective:    Vitals:   11/03/19 0536 11/03/19 0541 11/03/19 0938 11/03/19 1535  BP: 138/71 (!) 142/75 134/82   Pulse: 86 84  84  Resp: 18 17  10   Temp:      TempSrc:      SpO2: 100% 100%  95%  Weight:        Intake/Output Summary (Last 24 hours) at 11/03/2019 1730 Last data filed at 11/03/2019 1300 Gross per 24 hour  Intake 1028 ml  Output 1400 ml  Net -372 ml   Filed Weights   11/01/19 2204 11/02/19 0506 11/03/19 0500  Weight: 81.6 kg 81.2 kg 83.1 kg    Exam:  GEN: NAD SKIN: Diffuse papular rash on the trunk and thighs EYES: EOMI ENT: MMM CV: RRR PULM: CTA B ABD: soft, mild distention, NT, +BS CNS: AAO x 3, non focal EXT: b/l BKA.  Edema of bilateral thighs    Data Reviewed:   I have personally reviewed following labs and imaging studies:  Labs: Labs  show the following:   Basic Metabolic Panel: Recent Labs  Lab 10/31/19 1848 10/31/19 1848 11/01/19 [id number] 11/01/19 0637 11/02/19 0643 11/03/19 0502  NA 138  --  142  --  139 139  K 4.5   < > 4.5   < > 5.1 3.8  CL 113*  --  117*  --  116* 110  CO2 14*  --  15*  --  14* 21*  GLUCOSE 142*  --  80  --  73 91  BUN 62*  --  63*  --  67* 38*  CREATININE 7.63*  --  7.75*  --  8.05* 5.41*  CALCIUM 8.0*  --  8.1*  --  8.1* 7.6*  MG 2.0  --   --   --   --   --   PHOS 6.6*  --   --   --   --   --    < > = values in this  interval not displayed.   GFR Estimated Creatinine Clearance: 15.9 mL/min (A) (by C-G formula based on SCr of 5.41 mg/dL (H)). Liver Function Tests: Recent Labs  Lab 10/31/19 1848  AST 14*  ALT 6  ALKPHOS 87  BILITOT 0.5  PROT 6.3*  ALBUMIN 2.8*   No results for input(s): LIPASE, AMYLASE in the last 168 hours. No results for input(s): AMMONIA in the last 168 hours. Coagulation profile No results for input(s): INR, PROTIME in the last 168 hours.  CBC: Recent Labs  Lab 10/31/19 1848 11/03/19 0502  WBC 7.6 6.1  NEUTROABS 4.5  --   HGB 8.3* 7.3*  HCT 26.4* 22.3*  MCV 95.0 91.4  PLT 144* 119*   Cardiac Enzymes: No results for input(s): CKTOTAL, CKMB, CKMBINDEX, TROPONINI in the last 168 hours. BNP (last 3 results) No results for input(s): PROBNP in the last 8760 hours. CBG: Recent Labs  Lab 11/02/19 1214 11/02/19 1634 11/02/19 2217 11/03/19 0757 11/03/19 1144  GLUCAP 136* 76 84 93 92   D-Dimer: No results for input(s): DDIMER in the last 72 hours. Hgb A1c: Recent Labs    11/01/19 0637  HGBA1C 5.6   Lipid Profile: No results for input(s): CHOL, HDL, LDLCALC, TRIG, CHOLHDL, LDLDIRECT in the last 72 hours. Thyroid function studies: No results for input(s): TSH, T4TOTAL, T3FREE, THYROIDAB in the last 72 hours.  Invalid input(s): FREET3 Anemia work up: No results for input(s): VITAMINB12, FOLATE, FERRITIN, TIBC, IRON, RETICCTPCT in the last 72 hours. Sepsis Labs: Recent Labs  Lab 10/31/19 1848 11/03/19 0502  WBC 7.6 6.1    Microbiology Recent Results (from the past 240 hour(s))  Respiratory Panel by RT PCR (Flu A&B, Covid) - Nasopharyngeal Swab     Status: Abnormal   Collection Time: 10/31/19  8:56 PM   Specimen: Nasopharyngeal Swab  Result Value Ref Range Status   SARS Coronavirus 2 by RT PCR POSITIVE (A) NEGATIVE Final    Comment: RESULT CALLED TO, READ BACK BY AND VERIFIED WITH: Anderson Malta 10/31/19 @ 2218 [place] (NOTE) SARS-CoV-2 target nucleic  acids are DETECTED. SARS-CoV-2 RNA is generally detectable in upper respiratory specimens  during the acute phase of infection. Positive results are indicative of the presence of the identified virus, but do not rule out bacterial infection or co-infection with other pathogens not detected by the test. Clinical correlation with patient history and other diagnostic information is necessary to determine patient infection status. The expected result is Negative. Fact Sheet for Patients:  PinkCheek.be Fact Sheet for Healthcare Providers:  GravelBags.it This test is not yet approved or cleared by the [place] and  has been authorized for detection and/or diagnosis of SARS-CoV-2 by FDA under an Emergency Use Authorization (EUA).  This EUA will remain in effect (meaning this test can be used) for th e duration of  the COVID-19 declaration under Section 564(b)(1) of the Act, 21 U.S.C. section 360bbb-3(b)(1), unless the authorization is terminated or revoked sooner.    Influenza A by PCR NEGATIVE NEGATIVE Final   Influenza B by PCR NEGATIVE NEGATIVE Final    Comment: (NOTE) The Xpert Xpress SARS-CoV-2/FLU/RSV assay is intended as an aid in  the diagnosis of influenza from Nasopharyngeal swab specimens and  should not be used as a sole basis for treatment. Nasal washings and  aspirates are unacceptable for Xpert Xpress SARS-CoV-2/FLU/RSV  testing. Fact Sheet for Patients: PinkCheek.be Fact Sheet for Healthcare Providers: GravelBags.it This test is not yet approved or cleared by the [place] FDA and  has been authorized for detection and/or diagnosis of SARS-CoV-2 by  FDA under an Emergency Use Authorization (EUA). This EUA will remain  in effect (meaning this test can be used) for the duration of the  Covid-19 declaration under Section 564(b)(1) of the Act, 21   U.S.C. section 360bbb-3(b)(1), unless the authorization is  terminated or revoked. Performed at [facility], [address] [place], [place] [address]   MRSA PCR Screening     Status: None   Collection Time: 10/31/19 11:11 PM   Specimen: Nasopharyngeal  Result Value Ref Range Status   MRSA by PCR NEGATIVE NEGATIVE Final    Comment:        The GeneXpert MRSA Assay (FDA approved for NASAL specimens only), is one component of a comprehensive MRSA colonization surveillance program. It is not intended to diagnose MRSA infection nor to guide or monitor treatment for MRSA infections. Performed at [facility], [address] [place], [place] [address]     Procedures and diagnostic studies:  No results found.  Medications:   . alteplase  2 mg Intracatheter Once  . alteplase  2 mg Intracatheter Once  . amLODipine  5 mg Oral Daily  . calcium acetate  1,334 mg Oral TID WC  . Chlorhexidine Gluconate Cloth  6 each Topical Daily  . epoetin (EPOGEN/PROCRIT) injection  4,000 Units Intravenous Once  . furosemide  80 mg Intravenous BID  . gabapentin  800 mg Oral BID  . heparin  5,000 Units Subcutaneous Q8H  . hydrocerin   Topical BID  . insulin aspart  0-5 Units Subcutaneous QHS  . insulin aspart  0-6 Units Subcutaneous TID WC  . lurasidone  60 mg Oral QHS  . sodium chloride flush  3 mL Intravenous Q12H   Continuous Infusions: . sodium chloride       LOS: 3 days   Eiza Canniff  [facility]   *Please refer to [email or web address], password [email or web address] to get updated schedule on who will round on this patient, as hospitalists switch teams weekly. If 7PM-7AM, please contact night-coverage at [email or web address], password [email or web address] for any overnight needs.  11/03/2019, 5:30 PM

## 2019-11-03 NOTE — Progress Notes (Signed)
Plan of care reviewed with pt, meds discussed prior to administration; pt verbalized understanding; Continued BG monitoring; pt on IV lasix, MD aware of no changes in output; pain addressed with prn pain meds; pt given prn benadryl for itching due to rash on back; pt declined update to family; bed low and locked; call bell within reach; instructed to call for assistance.

## 2019-11-04 LAB — CBC
HCT: 24.5 % — ABNORMAL LOW (ref 36.0–46.0)
Hemoglobin: 7.8 g/dL — ABNORMAL LOW (ref 12.0–15.0)
MCH: 30.1 pg (ref 26.0–34.0)
MCHC: 31.8 g/dL (ref 30.0–36.0)
MCV: 94.6 fL (ref 80.0–100.0)
Platelets: 133 10*3/uL — ABNORMAL LOW (ref 150–400)
RBC: 2.59 MIL/uL — ABNORMAL LOW (ref 3.87–5.11)
RDW: 15.2 % (ref 11.5–15.5)
WBC: 5.9 10*3/uL (ref 4.0–10.5)
nRBC: 0 % (ref 0.0–0.2)

## 2019-11-04 LAB — GLUCOSE, CAPILLARY
Glucose-Capillary: 69 mg/dL — ABNORMAL LOW (ref 70–99)
Glucose-Capillary: 81 mg/dL (ref 70–99)
Glucose-Capillary: 84 mg/dL (ref 70–99)
Glucose-Capillary: 74 mg/dL (ref 70–99)
Glucose-Capillary: 80 mg/dL (ref 70–99)

## 2019-11-04 LAB — BASIC METABOLIC PANEL
Anion gap: 9 (ref 5–15)
BUN: 41 mg/dL — ABNORMAL HIGH (ref 6–20)
CO2: 21 mmol/L — ABNORMAL LOW (ref 22–32)
Calcium: 8.1 mg/dL — ABNORMAL LOW (ref 8.9–10.3)
Chloride: 108 mmol/L (ref 98–111)
Creatinine, Ser: 5.99 mg/dL — ABNORMAL HIGH (ref 0.44–1.00)
GFR calc Af Amer: 9 mL/min — ABNORMAL LOW (ref >60)
GFR calc non Af Amer: 8 mL/min — ABNORMAL LOW (ref >60)
Glucose, Bld: 78 mg/dL (ref 70–99)
Potassium: 4.3 mmol/L (ref 3.5–5.1)
Sodium: 138 mmol/L (ref 135–145)

## 2019-11-04 LAB — MAGNESIUM: Magnesium: 1.8 mg/dL (ref 1.7–2.4)

## 2019-11-04 LAB — BRAIN NATRIURETIC PEPTIDE: B Natriuretic Peptide: 920 pg/mL — ABNORMAL HIGH (ref 0.0–100.0)

## 2019-11-04 MED ORDER — FUROSEMIDE 10 MG/ML IJ SOLN
40.0000 mg | Freq: Two times a day (BID) | INTRAMUSCULAR | Status: DC
  Administered 2019-11-04 – 2019-11-05 (×4): 40 mg via INTRAVENOUS
  Filled 2019-11-04 (×5): qty 4

## 2019-11-04 MED ORDER — HYDROMORPHONE HCL 1 MG/ML IJ SOLN
0.5000 mg | INTRAMUSCULAR | Status: DC | PRN
  Administered 2019-11-04 – 2019-11-05 (×3): 0.5 mg via INTRAVENOUS
  Filled 2019-11-04 (×3): qty 1

## 2019-11-04 NOTE — Progress Notes (Signed)
Attempted to call family member with an update. Call went to voicemail.

## 2019-11-04 NOTE — Progress Notes (Signed)
Spoke with on call NP about pts order for IV lasix. Reported to NP that pt has been making very minimal urine and that the day shift RN reported that the pt had no increased urination after the 0800 dose of 80mg  of lasix. NP modified orders and states she will speak to the daytime medical team for further treatment.

## 2019-11-04 NOTE — Progress Notes (Signed)
Called and updated contact Lelan Pons. No further questions at this time.

## 2019-11-04 NOTE — Progress Notes (Signed)
[facility], [place] 11/04/19  Subjective:  Patient resting comfortably in bed. Ask as to when to she may be discharged. Unfortunately she does not have an outpatient hemodialysis center that is set up at the moment.    Objective:  Vital signs in last 24 hours:  Temp:  [97.7 F (36.5 C)-97.8 F (36.6 C)] 97.7 F (36.5 C) (01/24 0922) Pulse Rate:  [73-103] 80 (01/24 0600) Resp:  [10-22] 17 (01/24 0600) BP: (142-159)/(73-118) 143/86 (01/24 0922) SpO2:  [93 %-100 %] 99 % (01/24 0600) FiO2 (%):  [21 %] 21 % (01/23 2057) Weight:  [82.6 kg] 82.6 kg (01/24 0500)  Weight change: -0.5 kg Filed Weights   11/02/19 0506 11/03/19 0500 11/04/19 0500  Weight: 81.2 kg 83.1 kg 82.6 kg    Intake/Output:    Intake/Output Summary (Last 24 hours) at 11/04/2019 1302 Last data filed at 11/04/2019 0840 Gross per 24 hour  Intake 1298 ml  Output 50 ml  Net 1248 ml    Physical Exam: General:  No acute distress, laying in the bed  HEENT  anicteric, moist oral mucous membranes  Lungs:  Normal breathing effort, clear to auscultation  Heart::  S1S2 no rubs  Abdomen:  Soft, BS present  Extremities:  Bilateral leg amputations  Neurologic:  Alert, able to answer questions  Skin:  Prurigo nodules over abdomen and back  Access:  Right IJ PermCath      Basic Metabolic Panel:  Recent Labs  Lab 10/31/19 1848 10/31/19 1848 11/01/19 [id number] 11/01/19 0637 11/02/19 0643 11/03/19 0502 11/04/19 0511  NA 138  --  142  --  139 139 138  K 4.5  --  4.5  --  5.1 3.8 4.3  CL 113*  --  117*  --  116* 110 108  CO2 14*  --  15*  --  14* 21* 21*  GLUCOSE 142*  --  80  --  73 91 78  BUN 62*  --  63*  --  67* 38* 41*  CREATININE 7.63*  --  7.75*  --  8.05* 5.41* 5.99*  CALCIUM 8.0*   < > 8.1*   < > 8.1* 7.6* 8.1*  MG 2.0  --   --   --   --   --  1.8  PHOS 6.6*  --   --   --   --   --   --    < > = values in this interval not displayed.     CBC: Recent Labs  Lab  10/31/19 1848 11/03/19 0502 11/04/19 0511  WBC 7.6 6.1 5.9  NEUTROABS 4.5  --   --   HGB 8.3* 7.3* 7.8*  HCT 26.4* 22.3* 24.5*  MCV 95.0 91.4 94.6  PLT 144* 119* 133*      Lab Results  Component Value Date   HEPBSAG NON REACTIVE 11/01/2019   HEPBSAB NON REACTIVE 11/01/2019      Microbiology:  Recent Results (from the past 240 hour(s))  Respiratory Panel by RT PCR (Flu A&B, Covid) - Nasopharyngeal Swab     Status: Abnormal   Collection Time: 10/31/19  8:56 PM   Specimen: Nasopharyngeal Swab  Result Value Ref Range Status   SARS Coronavirus 2 by RT PCR POSITIVE (A) NEGATIVE Final    Comment: RESULT CALLED TO, READ BACK BY AND VERIFIED WITH: Enis Gash SLADE 10/31/19 @ 2218 [place] (NOTE) SARS-CoV-2 target nucleic acids are DETECTED. SARS-CoV-2 RNA is generally detectable in upper respiratory specimens  during  the acute phase of infection. Positive results are indicative of the presence of the identified virus, but do not rule out bacterial infection or co-infection with other pathogens not detected by the test. Clinical correlation with patient history and other diagnostic information is necessary to determine patient infection status. The expected result is Negative. Fact Sheet for Patients:  PinkCheek.be Fact Sheet for Healthcare Providers: GravelBags.it This test is not yet approved or cleared by the [place] FDA and  has been authorized for detection and/or diagnosis of SARS-CoV-2 by FDA under an Emergency Use Authorization (EUA).  This EUA will remain in effect (meaning this test can be used) for th e duration of  the COVID-19 declaration under Section 564(b)(1) of the Act, 21 U.S.C. section 360bbb-3(b)(1), unless the authorization is terminated or revoked sooner.    Influenza A by PCR NEGATIVE NEGATIVE Final   Influenza B by PCR NEGATIVE NEGATIVE Final    Comment: (NOTE) The Xpert Xpress SARS-CoV-2/FLU/RSV  assay is intended as an aid in  the diagnosis of influenza from Nasopharyngeal swab specimens and  should not be used as a sole basis for treatment. Nasal washings and  aspirates are unacceptable for Xpert Xpress SARS-CoV-2/FLU/RSV  testing. Fact Sheet for Patients: PinkCheek.be Fact Sheet for Healthcare Providers: GravelBags.it This test is not yet approved or cleared by the [place] FDA and  has been authorized for detection and/or diagnosis of SARS-CoV-2 by  FDA under an Emergency Use Authorization (EUA). This EUA will remain  in effect (meaning this test can be used) for the duration of the  Covid-19 declaration under Section 564(b)(1) of the Act, 21  U.S.C. section 360bbb-3(b)(1), unless the authorization is  terminated or revoked. Performed at [facility], [address] [place], [place] [address]   MRSA PCR Screening     Status: None   Collection Time: 10/31/19 11:11 PM   Specimen: Nasopharyngeal  Result Value Ref Range Status   MRSA by PCR NEGATIVE NEGATIVE Final    Comment:        The GeneXpert MRSA Assay (FDA approved for NASAL specimens only), is one component of a comprehensive MRSA colonization surveillance program. It is not intended to diagnose MRSA infection nor to guide or monitor treatment for MRSA infections. Performed at [facility], [address] [place],  [address]     Coagulation Studies: No results for input(s): LABPROT, INR in the last 72 hours.  Urinalysis: No results for input(s): COLORURINE, LABSPEC, PHURINE, GLUCOSEU, HGBUR, BILIRUBINUR, KETONESUR, PROTEINUR, UROBILINOGEN, NITRITE, LEUKOCYTESUR in the last 72 hours.  Invalid input(s): APPERANCEUR    Imaging: No results found.   Medications:   . sodium chloride     . alteplase  2 mg Intracatheter Once  . alteplase  2 mg Intracatheter Once  . amLODipine  5 mg Oral Daily  . calcium acetate   1,334 mg Oral TID WC  . Chlorhexidine Gluconate Cloth  6 each Topical Daily  . epoetin (EPOGEN/PROCRIT) injection  4,000 Units Intravenous Once  . furosemide  40 mg Intravenous BID  . gabapentin  800 mg Oral BID  . heparin  5,000 Units Subcutaneous Q8H  . hydrocerin   Topical BID  . insulin aspart  0-5 Units Subcutaneous QHS  . insulin aspart  0-6 Units Subcutaneous TID WC  . lurasidone  60 mg Oral QHS  . sodium chloride flush  3 mL Intravenous Q12H   sodium chloride, acetaminophen, ALPRAZolam, diphenhydrAMINE, heparin, hydrALAZINE, HYDROmorphone (DILAUDID) injection, ondansetron (ZOFRAN) IV, oxyCODONE,  sodium chloride flush  Assessment/ Plan:  39 y.o. 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 ESRD with LE edema No urgent indication for dialysis at the moment.  We will plan for dialysis again most likely tomorrow.  #. Anemia of CKD  Lab Results  Component Value Date   HGB 7.8 (L) 11/04/2019  Continue Epogen 4000 units IV with dialysis treatments.  #. SHPTH     Component Value Date/Time   PTH NOT PERFORMED 06/16/2019 [id number]   Lab Results  Component Value Date   PHOS 6.6 (H) 10/31/2019   Phosphorus 6.6 at last check.  Continue calcium acetate 2 tablets p.o. 3 times daily with meals.     LOS: 4 Kathy Phelps 1/24/20211:02 PM  [facility] [place], [place]

## 2019-11-04 NOTE — Progress Notes (Signed)
Progress Note    Kathy Phelps  [id number] DOB: June 04, 1981  DOA: 10/31/2019 PCP: Davonna Belling, MD      Brief Narrative:    Medical records reviewed and are as summarized below:  Kathy Phelps is an 39 y.o. 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  She states that she missed dialysis for the past 2 months because she did not have a ride.  She is short of breath only when she lays flat but feels fine when she is sitting up.  She denies cough, chest pain, fever or chills.  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   End-stage renal disease with fluid overload/acute systolic heart failure secondary to missed dialysis/dysfunctional permacath: 2D echo showed EF estimated at 45 to 50%, mild LVH.  She was seen by vascular surgeon and continuous infusion of TPA of tunneled hemodialysis catheter was performed successfully on 11/02/2019.  Follow-up with nephrologist for hemodialysis.  She does not have an outpatient hemodialysis center as of yet.  This has to be arranged prior to discharge per nephrologist.  Analgesics as needed for pain.  Added IV Dilaudid to oxycodone.  Continue IV Lasix.  Diabetes mellitus with recent hypoglycemia:  Encouraged adequate oral intake.  Hypertension: Continue antihypertensives.  Generalized papular rash: Etiology unclear.  Recommended outpatient follow-up with dermatologist.  COVID-19 infection: Asymptomatic.   Bipolar disorder: Continue Latuda  PVD: Status post bilateral BKA   Family Communication/Anticipated D/C date and plan/Code Status   DVT prophylaxis: Heparin Code Status: Full code Family Communication: Plan discussed with the patient Disposition Plan: Possible discharge to home in 1 to 2 days     Subjective:   She complains of pain in the lower back from swelling.  She said that oxycodone is not helping with her pain.  She is not having much urine output with IV Lasix.  Objective:    Vitals:   11/04/19 0400 11/04/19 0500 11/04/19 0600 11/04/19 0922  BP: (!) 144/84   (!) 143/86  Pulse:  86 80   Resp: 12 13 17    Temp:    97.7 F (36.5 C)  TempSrc:    Oral  SpO2: 95% 97% 99%   Weight:  82.6 kg      Intake/Output Summary (Last 24 hours) at 11/04/2019 1216 Last data filed at 11/04/2019 0840 Gross per 24 hour  Intake 1418 ml  Output 50 ml  Net 1368 ml   Filed Weights   11/02/19 0506 11/03/19 0500 11/04/19 0500  Weight: 81.2 kg 83.1 kg 82.6 kg    Exam:  GEN: NAD SKIN: Diffuse papular rash on the trunk and thighs EYES: EOMI ENT: MMM CV: RRR PULM: CTA B ABD: soft, distended, non tender CNS: AAO x 3,  non focal EXT: b/l BKA.  Edema of bilateral thighs BACK: Swelling/edema of lower back    Data Reviewed:   I have personally reviewed following labs and imaging studies:  Labs: Labs show the following:   Basic Metabolic Panel: Recent Labs  Lab 10/31/19 1848 10/31/19 1848 11/01/19 [id number] 11/01/19 [id number] 11/02/19 0643 11/02/19 0643 11/03/19 0502 11/04/19 0511  NA 138  --  142  --  139  --  139 138  K 4.5   < > 4.5   < > 5.1   < > 3.8 4.3  CL 113*  --  117*  --  116*  --  110 108  CO2 14*  --  15*  --  14*  --  21* 21*  GLUCOSE 142*  --  80   --  73  --  91 78  BUN 62*  --  63*  --  67*  --  38* 41*  CREATININE 7.63*  --  7.75*  --  8.05*  --  5.41* 5.99*  CALCIUM 8.0*  --  8.1*  --  8.1*  --  7.6* 8.1*  MG 2.0  --   --   --   --   --   --  1.8  PHOS 6.6*  --   --   --   --   --   --   --    < > = values in this interval not displayed.   GFR Estimated Creatinine Clearance: 14.3 mL/min (A) (by C-G formula based on SCr of 5.99 mg/dL (H)). Liver Function Tests: Recent Labs  Lab 10/31/19 1848  AST 14*  ALT 6  ALKPHOS 87  BILITOT 0.5  PROT 6.3*  ALBUMIN 2.8*   No results for input(s): LIPASE, AMYLASE in the last 168 hours. No results for input(s): AMMONIA in the last 168 hours. Coagulation profile No results for input(s): INR, PROTIME in the last 168 hours.  CBC: Recent Labs  Lab 10/31/19 1848 11/03/19 0502 11/04/19 0511  WBC 7.6 6.1 5.9  NEUTROABS 4.5  --   --   HGB 8.3* 7.3* 7.8*  HCT 26.4* 22.3* 24.5*  MCV 95.0 91.4 94.6  PLT 144* 119* 133*   Cardiac Enzymes: No results for input(s): CKTOTAL, CKMB, CKMBINDEX, TROPONINI in the last 168 hours. BNP (last 3 results) No results for input(s): PROBNP in the last 8760 hours. CBG: Recent Labs  Lab 11/03/19 1734 11/03/19 2205 11/04/19 0751 11/04/19 0827 11/04/19 1119  GLUCAP 101* 89 69* 81 84   D-Dimer: No results for input(s): DDIMER in the last 72 hours. Hgb A1c: No results for input(s): HGBA1C in the last 72 hours. Lipid Profile: No results for input(s): CHOL, HDL, LDLCALC, TRIG, CHOLHDL, LDLDIRECT in the last 72 hours. Thyroid function studies: No results for input(s): TSH, T4TOTAL, T3FREE, THYROIDAB in the last 72 hours.  Invalid input(s): FREET3 Anemia work up: No results for input(s): VITAMINB12, FOLATE, FERRITIN, TIBC, IRON, RETICCTPCT in the last 72 hours. Sepsis Labs: Recent Labs  Lab 10/31/19 1848 11/03/19 0502 11/04/19 0511  WBC 7.6 6.1 5.9    Microbiology Recent Results (from the past 240 hour(s))  Respiratory Panel by RT PCR  (Flu A&B, Covid) - Nasopharyngeal Swab     Status: Abnormal   Collection Time: 10/31/19  8:56 PM   Specimen: Nasopharyngeal Swab  Result Value Ref Range Status   SARS Coronavirus 2 by RT PCR POSITIVE (A) NEGATIVE Final    Comment: RESULT CALLED TO, READ BACK BY  AND VERIFIED WITH: Anderson Malta 10/31/19 @ 2218 [place] (NOTE) SARS-CoV-2 target nucleic acids are DETECTED. SARS-CoV-2 RNA is generally detectable in upper respiratory specimens  during the acute phase of infection. Positive results are indicative of the presence of the identified virus, but do not rule out bacterial infection or co-infection with other pathogens not detected by the test. Clinical correlation with patient history and other diagnostic information is necessary to determine patient infection status. The expected result is Negative. Fact Sheet for Patients:  PinkCheek.be Fact Sheet for Healthcare Providers: GravelBags.it This test is not yet approved or cleared by the [place] FDA and  has been authorized for detection and/or diagnosis of SARS-CoV-2 by FDA under an Emergency Use Authorization (EUA).  This EUA will remain in effect (meaning this test can be used) for th e duration of  the COVID-19 declaration under Section 564(b)(1) of the Act, 21 U.S.C. section 360bbb-3(b)(1), unless the authorization is terminated or revoked sooner.    Influenza A by PCR NEGATIVE NEGATIVE Final   Influenza B by PCR NEGATIVE NEGATIVE Final    Comment: (NOTE) The Xpert Xpress SARS-CoV-2/FLU/RSV assay is intended as an aid in  the diagnosis of influenza from Nasopharyngeal swab specimens and  should not be used as a sole basis for treatment. Nasal washings and  aspirates are unacceptable for Xpert Xpress SARS-CoV-2/FLU/RSV  testing. Fact Sheet for Patients: PinkCheek.be Fact Sheet for Healthcare  Providers: GravelBags.it This test is not yet approved or cleared by the [place] FDA and  has been authorized for detection and/or diagnosis of SARS-CoV-2 by  FDA under an Emergency Use Authorization (EUA). This EUA will remain  in effect (meaning this test can be used) for the duration of the  Covid-19 declaration under Section 564(b)(1) of the Act, 21  U.S.C. section 360bbb-3(b)(1), unless the authorization is  terminated or revoked. Performed at [facility], [address] [place], [place] [address]   MRSA PCR Screening     Status: None   Collection Time: 10/31/19 11:11 PM   Specimen: Nasopharyngeal  Result Value Ref Range Status   MRSA by PCR NEGATIVE NEGATIVE Final    Comment:        The GeneXpert MRSA Assay (FDA approved for NASAL specimens only), is one component of a comprehensive MRSA colonization surveillance program. It is not intended to diagnose MRSA infection nor to guide or monitor treatment for MRSA infections. Performed at [facility], [address] [place], [place] [address]     Procedures and diagnostic studies:  No results found.  Medications:   . alteplase  2 mg Intracatheter Once  . alteplase  2 mg Intracatheter Once  . amLODipine  5 mg Oral Daily  . calcium acetate  1,334 mg Oral TID WC  . Chlorhexidine Gluconate Cloth  6 each Topical Daily  . epoetin (EPOGEN/PROCRIT) injection  4,000 Units Intravenous Once  . furosemide  40 mg Intravenous BID  . gabapentin  800 mg Oral BID  . heparin  5,000 Units Subcutaneous Q8H  . hydrocerin   Topical BID  . insulin aspart  0-5 Units Subcutaneous QHS  . insulin aspart  0-6 Units Subcutaneous TID WC  . lurasidone  60 mg Oral QHS  . sodium chloride flush  3 mL Intravenous Q12H   Continuous Infusions: . sodium chloride       LOS: 4 days   Clarabelle Oscarson  [facility]   *Please refer to [email or web address], password [email or web address] to get updated schedule on  who will round on this patient, as hospitalists switch teams weekly. If 7PM-7AM, please contact night-coverage at [email or web address], password [email or web address] for any overnight needs.  11/04/2019, 12:16 PM

## 2019-11-05 LAB — BASIC METABOLIC PANEL
Anion gap: 10 (ref 5–15)
BUN: 41 mg/dL — ABNORMAL HIGH (ref 6–20)
CO2: 22 mmol/L (ref 22–32)
Calcium: 8.4 mg/dL — ABNORMAL LOW (ref 8.9–10.3)
Chloride: 109 mmol/L (ref 98–111)
Creatinine, Ser: 6.44 mg/dL — ABNORMAL HIGH (ref 0.44–1.00)
GFR calc Af Amer: 9 mL/min — ABNORMAL LOW (ref >60)
GFR calc non Af Amer: 7 mL/min — ABNORMAL LOW (ref >60)
Glucose, Bld: 87 mg/dL (ref 70–99)
Potassium: 4.1 mmol/L (ref 3.5–5.1)
Sodium: 141 mmol/L (ref 135–145)

## 2019-11-05 LAB — GLUCOSE, CAPILLARY
Glucose-Capillary: 78 mg/dL (ref 70–99)
Glucose-Capillary: 123 mg/dL — ABNORMAL HIGH (ref 70–99)
Glucose-Capillary: 95 mg/dL (ref 70–99)
Glucose-Capillary: 103 mg/dL — ABNORMAL HIGH (ref 70–99)

## 2019-11-05 NOTE — TOC Initial Note (Signed)
Transition of Care [facility]) - Initial/Assessment Note    Patient Details  Name: Kathy Phelps MRN: [id number] Date of Birth: 17-Aug-1981  [facility]) CM/SW Contact:    Shelbie Hutching, RN Phone Number: 11/05/2019, 11:52 AM  Clinical Narrative:                 Patient admitted to the hospital with pulmonary edema and COVID 19.  RNCM attempted to reach patient by phone, room phone and cell phone both called with no answer.  RNCM also called emergency contact and left a voice mail for return call.   Patient is a dialysis patient but has been non compliant with her treatment per notes because of transportation issues.  Patient has been discharged from her dialysis center for missing so many treatments.  Elvera Bicker with [facility], dialysis coordinator is actively looking for dialysis center to accept patient.  Patient has COVID and will need to go to a facility that is accepting COVID patient's.  RNCM will try to reach out to patient again this afternoon.    Expected Discharge Plan: Home/Self Care Barriers to Discharge: Continued Medical Work up   Patient Goals and CMS Choice        Expected Discharge Plan and Services Expected Discharge Plan: Home/Self Care   Discharge Planning Services: CM Consult   Living arrangements for the past 2 months: Mobile Home                                      Prior Living Arrangements/Services Living arrangements for the past 2 months: Mobile Home   Patient language and need for interpreter reviewed:: Yes              Criminal Activity/Legal Involvement Pertinent to Current Situation/Hospitalization: No - Comment as needed  Activities of Daily Living Home Assistive Devices/Equipment: CBG Meter, Wheelchair ADL Screening (condition at time of admission) Patient's cognitive ability adequate to safely complete daily activities?: Yes Is the patient deaf or have difficulty hearing?: No Does the patient have difficulty  seeing, even when wearing glasses/contacts?: No Does the patient have difficulty concentrating, remembering, or making decisions?: No Patient able to express need for assistance with ADLs?: Yes Does the patient have difficulty dressing or bathing?: No Independently performs ADLs?: No Does the patient have difficulty walking or climbing stairs?: Yes Weakness of Legs: Both Weakness of Arms/Hands: None  Permission Sought/Granted                  Emotional Assessment              Admission diagnosis:  Acute heart failure ([place]) [I50.9] ESRD on dialysis ([place]) [N18.6, Z99.2] Chest pain [R07.9] Other hypervolemia [E87.79] Patient Active Problem List   Diagnosis Date Noted  . ESRD on hemodialysis ([place]) 10/31/2019  . Pulmonary edema 10/31/2019  . Fluid overload 10/31/2019  . Acute heart failure ([place]) 10/31/2019  . Generalized papular rash 10/31/2019  . Pneumonia 08/20/2019  . 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[place]) 02/07/2019  . Acute on chronic renal failure ([place]) 02/07/2019  . Anxiety and depression   . 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 CKD stage 3 secondary to diabetes ([place]) 08/11/2018  . 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[place]) 08/11/2018  . Gangrene of right foot ([place])   . Lower limb ischemia 06/25/2018  . Lymphangitis 06/25/2018  .  Menorrhagia 06/25/2018  . Hx of BKA, left ([place]) 05/24/2018  . History of left below knee amputation ([place]) 02/01/2018  . Gangrene of left foot ([place])   . Dehiscence of amputation stump ([place]) 01/26/2018  . Surgical wound, non healing 09/02/2017  . Bipolar 1 disorder ([place]) 09/02/2017  . Nausea vomiting and diarrhea 08/11/2017  . Great toe pain, left 08/11/2017  . GERD (gastroesophageal reflux disease) 08/10/2017  . Anxiety 08/10/2017  . Edema   . Cellulitis   . AKI (acute kidney injury) ([place]) 02/04/2017  . HLD (hyperlipidemia) 02/02/2017  . Tobacco abuse 02/02/2017  . Essential hypertension  02/02/2017  . Malnutrition of moderate degree 02/02/2017   PCP:  Davonna Belling, MD Pharmacy:   [facility] [place], [address] [place] [address] Phone: [phone] Fax: [phone]     Social Determinants of Health (SDOH) Interventions    Readmission Risk Interventions Readmission Risk Prevention Plan 02/07/2019  Transportation Screening Complete  PCP or Specialist Appt within 3-5 Days Complete  HRI or [place] Complete  Social Work Consult for [place] Planning/Counseling Complete  Palliative Care Screening Not Applicable  Medication Review Press photographer) Complete  Some recent data might be hidden

## 2019-11-05 NOTE — Progress Notes (Signed)
Pt c/o not being able to urinate after attempting to use the bedpan. Notifying NP for straight cath order.

## 2019-11-05 NOTE — Progress Notes (Signed)
After several attempts to contact patient last week, today this person was able to contact patient through the dialysis nurses phone. Patient was aware that she was discharged from her home clinic and would like to get set up in [place]. Patient agreed to try [facility]. Patient was also informed that she will need to contact her Case Worker to get transportation set up for treatments.

## 2019-11-05 NOTE — Progress Notes (Signed)
Progress Note    Kathy Phelps  [id number] DOB: 07-12-1981  DOA: 10/31/2019 PCP: Davonna Belling, MD      Brief Narrative:    Medical records reviewed and are as summarized below:  Kathy Phelps is an 39 y.o. 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  She states that she missed dialysis for the past 2 months because she did not have a ride.  She is short of breath only when she lays flat but feels fine when she is sitting up.  She denies cough, chest pain, fever or chills.  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   End-stage renal disease with fluid overload/acute systolic heart failure secondary to missed dialysis/dysfunctional permacath: 2D echo showed EF estimated at 45 to 50%, mild LVH.  She was seen by vascular surgeon and continuous infusion of TPA of tunneled hemodialysis catheter was performed successfully on 11/02/2019.  Follow-up with nephrologist for hemodialysis.  She does not have an outpatient hemodialysis center as of yet.  This has to be arranged prior to discharge per nephrologist.  Continue oxycodone.  Discontinue Dilaudid.  Diabetes mellitus with recent hypoglycemia: Encouraged adequate oral  intake.  Hypertension: Continue antihypertensives.  Generalized papular rash: Etiology unclear.  Recommended outpatient follow-up with dermatologist.  COVID-19 infection: Asymptomatic.   Bipolar disorder: Continue Latuda  PVD: Status post bilateral BKA   Family Communication/Anticipated D/C date and plan/Code Status   DVT prophylaxis: Heparin Code Status: Full code Family Communication: Plan discussed with the patient Disposition Plan: Arrangements are underway for her to have an outpatient hemodialysis center.  This has to be done prior to discharge.       Subjective:   Patient has been very sleepy likely from Dilaudid.  She wants to go home.  Objective:    Vitals:   11/05/19 0450 11/05/19 0500 11/05/19 0616 11/05/19 0700  BP: (!) 163/103   (!) 151/68  Pulse: 84  88 80  Resp: 20  13 20   Temp: 97.9 F (36.6 C)   98.1 F (36.7 C)  TempSrc: Oral   Oral  SpO2: 100%  100% 98%  Weight:  81 kg      Intake/Output Summary (Last 24 hours) at 11/05/2019 1113 Last data filed at 11/05/2019 0100 Gross per 24 hour  Intake 683 ml  Output 475 ml  Net 208 ml   Filed Weights   11/03/19 0500 11/04/19 0500 11/05/19 0500  Weight: 83.1 kg 82.6 kg 81 kg    Exam:  GEN: NAD SKIN: Diffuse papular rash on the trunk and thighs EYES: EOMI ENT: MMM CV: RRR PULM: No wheezing or rales.   ABD: Soft, distended but nontender, soft.   CNS: Drowsy but arousable, non focal EXT: b/l BKA.  Edema of bilateral thighs BACK: Swelling/edema of lower back    Data Reviewed:   I have personally reviewed following labs and imaging studies:  Labs: Labs show the following:   Basic Metabolic Panel: Recent Labs  Lab 10/31/19 1848 10/31/19 1848 11/01/19 [id number] 11/01/19 [id number] 11/02/19 [id number] 11/02/19 0643 11/03/19 0502 11/03/19 0502 11/04/19 0511 11/05/19 0431  NA 138   < > 142  --  139  --  139  --  138 141  K 4.5   < > 4.5   < > 5.1   < > 3.8   < > 4.3 4.1  CL 113*   < > 117*  --   116*  --  110  --  108 109  CO2 14*   < > 15*  --  14*  --  21*  --  21* 22  GLUCOSE 142*   < > 80  --  73  --  91  --  78 87  BUN 62*   < > 63*  --  67*  --  38*  --  41* 41*  CREATININE 7.63*   < > 7.75*  --  8.05*  --  5.41*  --  5.99* 6.44*  CALCIUM 8.0*   < > 8.1*  --  8.1*  --  7.6*  --  8.1* 8.4*  MG 2.0  --   --   --   --   --   --   --  1.8  --   PHOS 6.6*  --   --   --   --   --   --   --   --   --    < > = values in this interval not displayed.   GFR Estimated Creatinine Clearance: 13.2 mL/min (A) (by C-G formula based on SCr of 6.44 mg/dL (H)). Liver Function Tests: Recent Labs  Lab 10/31/19 1848  AST 14*  ALT 6  ALKPHOS 87  BILITOT 0.5  PROT 6.3*  ALBUMIN 2.8*   No results for input(s): LIPASE, AMYLASE in the last 168 hours. No results for input(s): AMMONIA in the last 168 hours. Coagulation profile No results for input(s): INR, PROTIME in the last 168 hours.  CBC: Recent Labs  Lab 10/31/19 1848 11/03/19 0502 11/04/19 0511  WBC 7.6 6.1 5.9  NEUTROABS 4.5  --   --   HGB 8.3* 7.3* 7.8*  HCT 26.4* 22.3* 24.5*  MCV 95.0 91.4 94.6  PLT 144* 119* 133*   Cardiac Enzymes: No results for input(s): CKTOTAL, CKMB, CKMBINDEX, TROPONINI in the last 168 hours. BNP (last 3 results) No results for input(s): PROBNP in the last 8760 hours. CBG: Recent Labs  Lab 11/04/19 0827 11/04/19 1119 11/04/19 1629 11/04/19 2057 11/05/19 0752  GLUCAP 81 84 74 80 78   D-Dimer: No results for input(s): DDIMER in the last 72 hours. Hgb A1c: No results for input(s): HGBA1C in the last 72 hours. Lipid Profile: No results for input(s): CHOL, HDL, LDLCALC, TRIG, CHOLHDL, LDLDIRECT in the last 72 hours. Thyroid function studies: No results for input(s): TSH, T4TOTAL, T3FREE, THYROIDAB in the last 72 hours.  Invalid input(s): FREET3 Anemia work up: No results for input(s): VITAMINB12, FOLATE, FERRITIN, TIBC, IRON, RETICCTPCT in the last 72 hours. Sepsis Labs: Recent Labs   Lab 10/31/19 1848 11/03/19 0502 11/04/19 0511  WBC 7.6 6.1 5.9    Microbiology Recent Results (from the past 240 hour(s))  Respiratory Panel by RT PCR (Flu A&B, Covid) -  Nasopharyngeal Swab     Status: Abnormal   Collection Time: 10/31/19  8:56 PM   Specimen: Nasopharyngeal Swab  Result Value Ref Range Status   SARS Coronavirus 2 by RT PCR POSITIVE (A) NEGATIVE Final    Comment: RESULT CALLED TO, READ BACK BY AND VERIFIED WITH: Anderson Malta 10/31/19 @ 2218 [place] (NOTE) SARS-CoV-2 target nucleic acids are DETECTED. SARS-CoV-2 RNA is generally detectable in upper respiratory specimens  during the acute phase of infection. Positive results are indicative of the presence of the identified virus, but do not rule out bacterial infection or co-infection with other pathogens not detected by the test. Clinical correlation with patient history and other diagnostic information is necessary to determine patient infection status. The expected result is Negative. Fact Sheet for Patients:  PinkCheek.be Fact Sheet for Healthcare Providers: GravelBags.it This test is not yet approved or cleared by the [place] FDA and  has been authorized for detection and/or diagnosis of SARS-CoV-2 by FDA under an Emergency Use Authorization (EUA).  This EUA will remain in effect (meaning this test can be used) for th e duration of  the COVID-19 declaration under Section 564(b)(1) of the Act, 21 U.S.C. section 360bbb-3(b)(1), unless the authorization is terminated or revoked sooner.    Influenza A by PCR NEGATIVE NEGATIVE Final   Influenza B by PCR NEGATIVE NEGATIVE Final    Comment: (NOTE) The Xpert Xpress SARS-CoV-2/FLU/RSV assay is intended as an aid in  the diagnosis of influenza from Nasopharyngeal swab specimens and  should not be used as a sole basis for treatment. Nasal washings and  aspirates are unacceptable for Xpert Xpress  SARS-CoV-2/FLU/RSV  testing. Fact Sheet for Patients: PinkCheek.be Fact Sheet for Healthcare Providers: GravelBags.it This test is not yet approved or cleared by the [place] FDA and  has been authorized for detection and/or diagnosis of SARS-CoV-2 by  FDA under an Emergency Use Authorization (EUA). This EUA will remain  in effect (meaning this test can be used) for the duration of the  Covid-19 declaration under Section 564(b)(1) of the Act, 21  U.S.C. section 360bbb-3(b)(1), unless the authorization is  terminated or revoked. Performed at [facility], [address] [place], [place] [address]   MRSA PCR Screening     Status: None   Collection Time: 10/31/19 11:11 PM   Specimen: Nasopharyngeal  Result Value Ref Range Status   MRSA by PCR NEGATIVE NEGATIVE Final    Comment:        The GeneXpert MRSA Assay (FDA approved for NASAL specimens only), is one component of a comprehensive MRSA colonization surveillance program. It is not intended to diagnose MRSA infection nor to guide or monitor treatment for MRSA infections. Performed at [facility] [address] [place], [place] [address]     Procedures and diagnostic studies:  No results found.  Medications:   . alteplase  2 mg Intracatheter Once  . alteplase  2 mg Intracatheter Once  . amLODipine  5 mg Oral Daily  . calcium acetate  1,334 mg Oral TID WC  . Chlorhexidine Gluconate Cloth  6 each Topical Daily  . epoetin (EPOGEN/PROCRIT) injection  4,000 Units Intravenous Once  . furosemide  40 mg Intravenous BID  . gabapentin  800 mg Oral BID  . heparin  5,000 Units Subcutaneous Q8H  . hydrocerin   Topical BID  . insulin aspart  0-5 Units Subcutaneous QHS  . insulin aspart  0-6 Units Subcutaneous TID WC  . lurasidone  60  mg Oral QHS  . sodium chloride flush  3 mL Intravenous Q12H   Continuous Infusions: . sodium chloride        LOS: 5 days   Philisha Weinel  [facility]   *Please refer to [email or web address], password [email or web address] to get updated schedule on who will round on this patient, as hospitalists switch teams weekly. If 7PM-7AM, please contact night-coverage at [email or web address], password [email or web address] for any overnight needs.  11/05/2019, 11:13 AM

## 2019-11-05 NOTE — TOC Progression Note (Signed)
Transition of Care [facility]) - Progression Note    Patient Details  Name: SHASHAWNA STECKEL MRN: [id number] Date of Birth: 1981-01-29  Transition of Care [facility]) CM/SW Contact  Shelbie Hutching, RN Phone Number: 11/05/2019, 2:12 PM  Clinical Narrative:    Janalyn Shy returned RNCM's phone call.  Lelan Pons reports that the patient and her have been friends since childhood and are like sisters, the MD has been calling her with updates every evening.  Lelan Pons reports that the patient's living situation is not ideal, patient is in a wheelchair, she has bilateral BKA's, Lelan Pons says her wheelchair will not fit through the bathroom door.  The patient lives with her 3 minor children 25, 64, and 29 years old.  Patient has no transportation and missed dialysis for 2 months because she could not get transportation.  Lelan Pons does not live close by and cannot assist with care needs.  Lelan Pons reports that the patient is not close to her family, and that she would give them money for transportation and then they would never pick her up and take her to her appointments.  Patient has Medicaid and should be able to get Medicaid transportation but she has not been able to set this up.   RNCM can set up home health at discharge, RNCM will cont to follow for any additional needs.    Expected Discharge Plan: Home/Self Care Barriers to Discharge: Continued Medical Work up  Expected Discharge Plan and Services Expected Discharge Plan: Home/Self Care   Discharge Planning Services: CM Consult   Living arrangements for the past 2 months: Mobile Home                                       Social Determinants of Health (SDOH) Interventions    Readmission Risk Interventions Readmission Risk Prevention Plan 02/07/2019  Transportation Screening Complete  PCP or Specialist Appt within 3-5 Days Complete  HRI or [place] Complete  Social Work Consult for [place] Planning/Counseling Complete  Palliative Care  Screening Not Applicable  Medication Review Press photographer) Complete  Some recent data might be hidden

## 2019-11-05 NOTE — Progress Notes (Signed)
[place], [place] 11/05/19  Subjective:  Patient seen and evaluated during hemodialysis. Tolerating well.    HEMODIALYSIS FLOWSHEET:  Blood Flow Rate (mL/min): 400 mL/min Arterial Pressure (mmHg): -180 mmHg Venous Pressure (mmHg): 150 mmHg Transmembrane Pressure (mmHg): 50 mmHg Ultrafiltration Rate (mL/min): 920 mL/min Dialysate Flow Rate (mL/min): 800 ml/min Conductivity: Machine : 13.8 Conductivity: Machine : 13.8 Dialysis Fluid Bolus: Normal Saline Bolus Amount (mL): 200 mL Dialysate Change: 2K     Objective:  Vital signs in last 24 hours:  Temp:  [97.9 F (36.6 C)-98.4 F (36.9 C)] 98.2 F (36.8 C) (01/25 1100) Pulse Rate:  [77-88] 88 (01/25 1145) Resp:  [12-20] 12 (01/25 1145) BP: (111-163)/(51-103) 140/90 (01/25 1145) SpO2:  [95 %-100 %] 96 % (01/25 1145) FiO2 (%):  [21 %] 21 % (01/24 1948) Weight:  [81 kg] 81 kg (01/25 0500)  Weight change: -1.6 kg Filed Weights   11/03/19 0500 11/04/19 0500 11/05/19 0500  Weight: 83.1 kg 82.6 kg 81 kg    Intake/Output:    Intake/Output Summary (Last 24 hours) at 11/05/2019 1205 Last data filed at 11/05/2019 0100 Gross per 24 hour  Intake 683 ml  Output 475 ml  Net 208 ml    Physical Exam: General:  No acute distress, laying in the bed  HEENT  anicteric, moist oral mucous membranes  Lungs:  Normal breathing effort, clear to auscultation  Heart::  S1S2 no rubs  Abdomen:  Soft, BS present  Extremities:  Bilateral leg amputations  Neurologic:  Alert, able to answer questions  Skin:  Prurigo nodules over abdomen and back  Access:  Right IJ PermCath      Basic Metabolic Panel:  Recent Labs  Lab 10/31/19 1848 10/31/19 1848 11/01/19 [id number] 11/01/19 [id number] 11/02/19 0643 11/02/19 0643 11/03/19 0502 11/04/19 0511 11/05/19 0431  NA 138   < > 142  --  139  --  139 138 141  K 4.5   < > 4.5  --  5.1  --  3.8 4.3 4.1  CL 113*   < > 117*  --  116*  --  110 108 109  CO2 14*   < > 15*   --  14*  --  21* 21* 22  GLUCOSE 142*   < > 80  --  73  --  91 78 87  BUN 62*   < > 63*  --  67*  --  38* 41* 41*  CREATININE 7.63*   < > 7.75*  --  8.05*  --  5.41* 5.99* 6.44*  CALCIUM 8.0*   < > 8.1*   < > 8.1*   < > 7.6* 8.1* 8.4*  MG 2.0  --   --   --   --   --   --  1.8  --   PHOS 6.6*  --   --   --   --   --   --   --   --    < > = values in this interval not displayed.     CBC: Recent Labs  Lab 10/31/19 1848 11/03/19 0502 11/04/19 0511  WBC 7.6 6.1 5.9  NEUTROABS 4.5  --   --   HGB 8.3* 7.3* 7.8*  HCT 26.4* 22.3* 24.5*  MCV 95.0 91.4 94.6  PLT 144* 119* 133*      Lab Results  Component Value Date   HEPBSAG NON REACTIVE 11/01/2019   HEPBSAB NON REACTIVE 11/01/2019      Microbiology:  Recent Results (from the past 240 hour(s))  Respiratory Panel by RT PCR (Flu A&B, Covid) - Nasopharyngeal Swab     Status: Abnormal   Collection Time: 10/31/19  8:56 PM   Specimen: Nasopharyngeal Swab  Result Value Ref Range Status   SARS Coronavirus 2 by RT PCR POSITIVE (A) NEGATIVE Final    Comment: RESULT CALLED TO, READ BACK BY AND VERIFIED WITH: Enis Gash SLADE 10/31/19 @ 2218 [place] (NOTE) SARS-CoV-2 target nucleic acids are DETECTED. SARS-CoV-2 RNA is generally detectable in upper respiratory specimens  during the acute phase of infection. Positive results are indicative of the presence of the identified virus, but do not rule out bacterial infection or co-infection with other pathogens not detected by the test. Clinical correlation with patient history and other diagnostic information is necessary to determine patient infection status. The expected result is Negative. Fact Sheet for Patients:  PinkCheek.be Fact Sheet for Healthcare Providers: GravelBags.it This test is not yet approved or cleared by the [place] FDA and  has been authorized for detection and/or diagnosis of SARS-CoV-2 by FDA under an Emergency  Use Authorization (EUA).  This EUA will remain in effect (meaning this test can be used) for th e duration of  the COVID-19 declaration under Section 564(b)(1) of the Act, 21 U.S.C. section 360bbb-3(b)(1), unless the authorization is terminated or revoked sooner.    Influenza A by PCR NEGATIVE NEGATIVE Final   Influenza B by PCR NEGATIVE NEGATIVE Final    Comment: (NOTE) The Xpert Xpress SARS-CoV-2/FLU/RSV assay is intended as an aid in  the diagnosis of influenza from Nasopharyngeal swab specimens and  should not be used as a sole basis for treatment. Nasal washings and  aspirates are unacceptable for Xpert Xpress SARS-CoV-2/FLU/RSV  testing. Fact Sheet for Patients: PinkCheek.be Fact Sheet for Healthcare Providers: GravelBags.it This test is not yet approved or cleared by the [place] FDA and  has been authorized for detection and/or diagnosis of SARS-CoV-2 by  FDA under an Emergency Use Authorization (EUA). This EUA will remain  in effect (meaning this test can be used) for the duration of the  Covid-19 declaration under Section 564(b)(1) of the Act, 21  U.S.C. section 360bbb-3(b)(1), unless the authorization is  terminated or revoked. Performed at [facility], [address] [place], [place] [address]   MRSA PCR Screening     Status: None   Collection Time: 10/31/19 11:11 PM   Specimen: Nasopharyngeal  Result Value Ref Range Status   MRSA by PCR NEGATIVE NEGATIVE Final    Comment:        The GeneXpert MRSA Assay (FDA approved for NASAL specimens only), is one component of a comprehensive MRSA colonization surveillance program. It is not intended to diagnose MRSA infection nor to guide or monitor treatment for MRSA infections. Performed at [facility], [address] [place], [place] [address]     Coagulation Studies: No results for input(s): LABPROT, INR in the last 72  hours.  Urinalysis: No results for input(s): COLORURINE, LABSPEC, PHURINE, GLUCOSEU, HGBUR, BILIRUBINUR, KETONESUR, PROTEINUR, UROBILINOGEN, NITRITE, LEUKOCYTESUR in the last 72 hours.  Invalid input(s): APPERANCEUR    Imaging: No results found.   Medications:   . sodium chloride     . alteplase  2 mg Intracatheter Once  . alteplase  2 mg Intracatheter Once  . amLODipine  5 mg Oral Daily  . calcium acetate  1,334 mg Oral TID WC  . Chlorhexidine Gluconate Cloth  6 each Topical Daily  .  epoetin (EPOGEN/PROCRIT) injection  4,000 Units Intravenous Once  . furosemide  40 mg Intravenous BID  . gabapentin  800 mg Oral BID  . heparin  5,000 Units Subcutaneous Q8H  . hydrocerin   Topical BID  . insulin aspart  0-5 Units Subcutaneous QHS  . insulin aspart  0-6 Units Subcutaneous TID WC  . lurasidone  60 mg Oral QHS  . sodium chloride flush  3 mL Intravenous Q12H   sodium chloride, acetaminophen, ALPRAZolam, diphenhydrAMINE, heparin, hydrALAZINE, ondansetron (ZOFRAN) IV, oxyCODONE, sodium chloride flush  Assessment/ Plan:  39 y.o. 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 ESRD with LE edema Patient seen during dialysis treatment.  Tolerating well.  We will begin outpatient hemodialysis center placement.  #. Anemia of CKD  Lab Results  Component Value Date   HGB 7.8 (L) 11/04/2019  Hemoglobin currently 7.8.  Maintain the patient on Epogen 4000 IV with dialysis.  #. SHPTH     Component Value Date/Time    PTH NOT PERFORMED 06/16/2019 0611   Lab Results  Component Value Date   PHOS 6.6 (H) 10/31/2019   Repeat serum phosphorus today.  Otherwise maintain the patient on calcium acetate.     LOS: 5 Kinser Fellman 1/25/202112:05 PM  [facility] [place], [place]

## 2019-11-06 LAB — GLUCOSE, CAPILLARY
Glucose-Capillary: 83 mg/dL (ref 70–99)
Glucose-Capillary: 116 mg/dL — ABNORMAL HIGH (ref 70–99)
Glucose-Capillary: 102 mg/dL — ABNORMAL HIGH (ref 70–99)
Glucose-Capillary: 110 mg/dL — ABNORMAL HIGH (ref 70–99)

## 2019-11-06 MED ORDER — CARVEDILOL 3.125 MG PO TABS
3.1250 mg | ORAL_TABLET | Freq: Two times a day (BID) | ORAL | Status: DC
  Administered 2019-11-06 – 2019-11-08 (×3): 3.125 mg via ORAL
  Filled 2019-11-06 (×3): qty 1

## 2019-11-06 MED ORDER — FUROSEMIDE 40 MG PO TABS: 40.0000 mg | ORAL_TABLET | Freq: Two times a day (BID) | ORAL | Status: DC

## 2019-11-06 MED ORDER — FUROSEMIDE 40 MG PO TABS
40.0000 mg | ORAL_TABLET | Freq: Two times a day (BID) | ORAL | Status: DC
  Administered 2019-11-06 – 2019-11-08 (×5): 40 mg via ORAL
  Filled 2019-11-06 (×5): qty 1

## 2019-11-06 NOTE — Progress Notes (Signed)
Dr. Mal Misty is ok with patient not having an IV.  Clarise Cruz, BSN

## 2019-11-06 NOTE — Progress Notes (Signed)
   11/06/19 1613  Urine Characteristics  Urinary Interventions Bladder scan  Bladder Scan Volume (mL) 253 mL  Dr. Mal Misty made aware that patient has not voided this shift and bladder scan shows 253.  Per MD, continue to wait to see if patient can void on her own.  Clarise Cruz, BSN

## 2019-11-06 NOTE — Progress Notes (Signed)
[place], [place] 11/06/19  Subjective:  Patient completed hemodialysis yesterday. Tolerating well. Outpatient hemodialysis center placement pending.    Objective:  Vital signs in last 24 hours:  Temp:  [98.2 F (36.8 C)-99.7 F (37.6 C)] 98.3 F (36.8 C) (01/26 0800) Pulse Rate:  [77-95] 95 (01/26 0800) Resp:  [10-21] 21 (01/26 0800) BP: (111-159)/(51-131) 136/67 (01/26 0800) SpO2:  [90 %-100 %] 96 % (01/26 0800) Weight:  [79.9 kg] 79.9 kg (01/26 0700)  Weight change: -1.1 kg Filed Weights   11/04/19 0500 11/05/19 0500 11/06/19 0700  Weight: 82.6 kg 81 kg 79.9 kg    Intake/Output:    Intake/Output Summary (Last 24 hours) at 11/06/2019 1043 Last data filed at 11/06/2019 0600 Gross per 24 hour  Intake --  Output 2300 ml  Net -2300 ml    Physical Exam: General:  No acute distress, laying in the bed  HEENT  anicteric, moist oral mucous membranes  Lungs:  Normal breathing effort, clear to auscultation  Heart::  S1S2 no rubs  Abdomen:  Soft, BS present  Extremities:  Bilateral leg amputations  Neurologic:  Alert, able to answer questions  Skin:  Prurigo nodules over abdomen and back  Access:  Right IJ PermCath      Basic Metabolic Panel:  Recent Labs  Lab 10/31/19 1848 10/31/19 1848 11/01/19 [id number] 11/01/19 [id number] 11/02/19 0643 11/02/19 0643 11/03/19 0502 11/04/19 0511 11/05/19 0431  NA 138   < > 142  --  139  --  139 138 141  K 4.5   < > 4.5  --  5.1  --  3.8 4.3 4.1  CL 113*   < > 117*  --  116*  --  110 108 109  CO2 14*   < > 15*  --  14*  --  21* 21* 22  GLUCOSE 142*   < > 80  --  73  --  91 78 87  BUN 62*   < > 63*  --  67*  --  38* 41* 41*  CREATININE 7.63*   < > 7.75*  --  8.05*  --  5.41* 5.99* 6.44*  CALCIUM 8.0*   < > 8.1*   < > 8.1*   < > 7.6* 8.1* 8.4*  MG 2.0  --   --   --   --   --   --  1.8  --   PHOS 6.6*  --   --   --   --   --   --   --   --    < > = values in this interval not displayed.      CBC: Recent Labs  Lab 10/31/19 1848 11/03/19 0502 11/04/19 0511  WBC 7.6 6.1 5.9  NEUTROABS 4.5  --   --   HGB 8.3* 7.3* 7.8*  HCT 26.4* 22.3* 24.5*  MCV 95.0 91.4 94.6  PLT 144* 119* 133*      Lab Results  Component Value Date   HEPBSAG NON REACTIVE 11/01/2019   HEPBSAB NON REACTIVE 11/01/2019      Microbiology:  Recent Results (from the past 240 hour(s))  Respiratory Panel by RT PCR (Flu A&B, Covid) - Nasopharyngeal Swab     Status: Abnormal   Collection Time: 10/31/19  8:56 PM   Specimen: Nasopharyngeal Swab  Result Value Ref Range Status   SARS Coronavirus 2 by RT PCR POSITIVE (A) NEGATIVE Final    Comment: RESULT CALLED TO, READ BACK BY  AND VERIFIED WITH: Anderson Malta 10/31/19 @ 2218 [place] (NOTE) SARS-CoV-2 target nucleic acids are DETECTED. SARS-CoV-2 RNA is generally detectable in upper respiratory specimens  during the acute phase of infection. Positive results are indicative of the presence of the identified virus, but do not rule out bacterial infection or co-infection with other pathogens not detected by the test. Clinical correlation with patient history and other diagnostic information is necessary to determine patient infection status. The expected result is Negative. Fact Sheet for Patients:  PinkCheek.be Fact Sheet for Healthcare Providers: GravelBags.it This test is not yet approved or cleared by the [place] FDA and  has been authorized for detection and/or diagnosis of SARS-CoV-2 by FDA under an Emergency Use Authorization (EUA).  This EUA will remain in effect (meaning this test can be used) for th e duration of  the COVID-19 declaration under Section 564(b)(1) of the Act, 21 U.S.C. section 360bbb-3(b)(1), unless the authorization is terminated or revoked sooner.    Influenza A by PCR NEGATIVE NEGATIVE Final   Influenza B by PCR NEGATIVE NEGATIVE Final    Comment: (NOTE) The  Xpert Xpress SARS-CoV-2/FLU/RSV assay is intended as an aid in  the diagnosis of influenza from Nasopharyngeal swab specimens and  should not be used as a sole basis for treatment. Nasal washings and  aspirates are unacceptable for Xpert Xpress SARS-CoV-2/FLU/RSV  testing. Fact Sheet for Patients: PinkCheek.be Fact Sheet for Healthcare Providers: GravelBags.it This test is not yet approved or cleared by the [place] FDA and  has been authorized for detection and/or diagnosis of SARS-CoV-2 by  FDA under an Emergency Use Authorization (EUA). This EUA will remain  in effect (meaning this test can be used) for the duration of the  Covid-19 declaration under Section 564(b)(1) of the Act, 21  U.S.C. section 360bbb-3(b)(1), unless the authorization is  terminated or revoked. Performed at [facility], [address] [place], [place] [address]   MRSA PCR Screening     Status: None   Collection Time: 10/31/19 11:11 PM   Specimen: Nasopharyngeal  Result Value Ref Range Status   MRSA by PCR NEGATIVE NEGATIVE Final    Comment:        The GeneXpert MRSA Assay (FDA approved for NASAL specimens only), is one component of a comprehensive MRSA colonization surveillance program. It is not intended to diagnose MRSA infection nor to guide or monitor treatment for MRSA infections. Performed at [facility], [address] [place], [place] [address]     Coagulation Studies: No results for input(s): LABPROT, INR in the last 72 hours.  Urinalysis: No results for input(s): COLORURINE, LABSPEC, PHURINE, GLUCOSEU, HGBUR, BILIRUBINUR, KETONESUR, PROTEINUR, UROBILINOGEN, NITRITE, LEUKOCYTESUR in the last 72 hours.  Invalid input(s): APPERANCEUR    Imaging: No results found.   Medications:   . sodium chloride     . alteplase  2 mg Intracatheter Once  . alteplase  2 mg Intracatheter Once  . amLODipine  5 mg  Oral Daily  . calcium acetate  1,334 mg Oral TID WC  . carvedilol  3.125 mg Oral BID WC  . Chlorhexidine Gluconate Cloth  6 each Topical Daily  . epoetin (EPOGEN/PROCRIT) injection  4,000 Units Intravenous Once  . furosemide  40 mg Oral BID  . gabapentin  800 mg Oral BID  . heparin  5,000 Units Subcutaneous Q8H  . hydrocerin   Topical BID  . insulin aspart  0-5 Units Subcutaneous QHS  . insulin aspart  0-6 Units Subcutaneous  TID WC  . lurasidone  60 mg Oral QHS  . sodium chloride flush  3 mL Intravenous Q12H   sodium chloride, acetaminophen, ALPRAZolam, diphenhydrAMINE, heparin, hydrALAZINE, ondansetron (ZOFRAN) IV, oxyCODONE, sodium chloride flush  Assessment/ Plan:  39 y.o. 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 ESRD with LE edema Patient completed dialysis yesterday.  Tolerated well.  We will plan for dialysis again tomorrow.  Outpatient hemodialysis placement currently pending at [facility].  #. Anemia of CKD  Lab Results  Component Value Date   HGB 7.8 (L) 11/04/2019  Maintain the patient on Epogen 4000 units IV with dialysis.  #. SHPTH     Component Value Date/Time   PTH NOT PERFORMED 06/16/2019 0611   Lab Results  Component Value Date   PHOS 6.6 (H) 10/31/2019   Repeat serum phosphorus tomorrow.  Most recent serum phosphorus was 6.6.     LOS: 6 Jarold Macomber 1/26/202110:43 AM  [place], [place]

## 2019-11-06 NOTE — Progress Notes (Signed)
Progress Note    Kathy Phelps  [id number] DOB: August 17, 1981  DOA: 10/31/2019 PCP: Davonna Belling, MD      Brief Narrative:    Medical records reviewed and are as summarized below:  Kathy Phelps is an 39 y.o. 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  She states that she missed dialysis for the past 2 months because she did not have a ride.  She is short of breath only when she lays flat but feels fine when she is sitting up.  She denies cough, chest pain, fever or chills.  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   End-stage renal disease with fluid overload/acute systolic heart failure secondary to missed dialysis/dysfunctional permacath: 2D echo showed EF estimated at 45 to 50%, mild LVH.  She was seen by vascular surgeon and continuous infusion of TPA of tunneled hemodialysis catheter was performed successfully on 11/02/2019.  Follow-up with nephrologist for hemodialysis.  She does not have an outpatient hemodialysis center as of yet.  This has to be arranged prior to discharge per nephrologist.  Continue oxycodone  Diabetes mellitus with recent hypoglycemia: Encouraged adequate oral intake.  Hypertension: Continue  amlodipine.  Added Coreg.  Generalized papular rash: Etiology unclear.  Recommended outpatient follow-up with dermatologist.  COVID-19 infection: Asymptomatic.   Bipolar disorder: Continue [place]  PVD: Status post bilateral BKA   Family Communication/Anticipated D/C date and plan/Code Status   DVT prophylaxis: Heparin Code Status: Full code Family Communication: Plan discussed with the patient Disposition Plan: Arrangements are underway for her to have an outpatient hemodialysis center.  This has to be done prior to discharge.       Subjective:   No new complaints.  No shortness of breath or chest pain.  Swelling in abdomen and thighs are improving.  Objective:    Vitals:   11/06/19 0324 11/06/19 0538 11/06/19 0700 11/06/19 0800  BP:    136/67  Pulse: 93 91  95  Resp: 10 13  (!) 21  Temp:    98.3 F (36.8 C)  TempSrc:    Oral  SpO2: 94% 95%  96%  Weight:   79.9 kg     Intake/Output Summary (Last 24 hours) at 11/06/2019 1254 Last data filed at 11/06/2019 0600 Gross per 24 hour  Intake --  Output 2300 ml  Net -2300 ml   Filed Weights   11/04/19 0500 11/05/19 0500 11/06/19 0700  Weight: 82.6 kg 81 kg 79.9 kg    Exam:  GEN: NAD SKIN: Diffuse papular rash on the trunk and thighs EYES: EOMI ENT: MMM CV: RRR PULM: No wheezing or rales.   ABD: Soft, distended but improving but nontender, soft.   CNS: Drowsy but arousable, non focal EXT:  b/l BKA.  Edema of bilateral thighs slowly improving BACK: Swelling/edema of lower back improving    Data Reviewed:   I have personally reviewed following labs and imaging studies:  Labs: Labs show the following:   Basic Metabolic Panel: Recent Labs  Lab 10/31/19 1848 10/31/19 1848 11/01/19 [id number] 11/01/19 [id number] 11/02/19 [id number] 11/02/19 0643 11/03/19 0502 11/03/19 0502 11/04/19 0511 11/05/19 0431  NA 138   < > 142  --  139  --  139  --  138 141  K 4.5   < > 4.5   < > 5.1   < > 3.8   < > 4.3 4.1  CL 113*   < >  117*  --  116*  --  110  --  108 109  CO2 14*   < > 15*  --  14*  --  21*  --  21* 22  GLUCOSE 142*   < > 80  --  73  --  91  --  78 87  BUN 62*   < > 63*  --  67*  --  38*  --  41* 41*  CREATININE 7.63*   < > 7.75*  --  8.05*  --  5.41*  --  5.99* 6.44*  CALCIUM 8.0*   < > 8.1*  --  8.1*  --  7.6*  --  8.1* 8.4*  MG 2.0  --   --   --   --   --   --   --  1.8  --   PHOS 6.6*  --   --   --   --   --   --   --   --   --    < > = values in this interval not displayed.   GFR Estimated Creatinine Clearance: 13.1 mL/min (A) (by C-G formula based on SCr of 6.44 mg/dL (H)). Liver Function Tests: Recent Labs  Lab 10/31/19 1848  AST 14*  ALT 6  ALKPHOS 87  BILITOT 0.5  PROT 6.3*  ALBUMIN 2.8*   No results for input(s): LIPASE, AMYLASE in the last 168 hours. No results for input(s): AMMONIA in the last 168 hours. Coagulation profile No results for input(s): INR, PROTIME in the last 168 hours.  CBC: Recent Labs  Lab 10/31/19 1848 11/03/19 0502 11/04/19 0511  WBC 7.6 6.1 5.9  NEUTROABS 4.5  --   --   HGB 8.3* 7.3* 7.8*  HCT 26.4* 22.3* 24.5*  MCV 95.0 91.4 94.6  PLT 144* 119* 133*   Cardiac Enzymes: No results for input(s): CKTOTAL, CKMB, CKMBINDEX, TROPONINI in the last 168 hours. BNP (last 3 results) No results for input(s): PROBNP in the last 8760 hours. CBG: Recent Labs  Lab 11/05/19 1438 11/05/19 1651 11/05/19 2126 11/06/19 0758 11/06/19 1154  GLUCAP 123* 95 103* 83 116*   D-Dimer: No results for input(s): DDIMER in the last 72 hours. Hgb A1c: No results for input(s): HGBA1C in the last 72 hours. Lipid Profile: No results for input(s): CHOL, HDL, LDLCALC, TRIG, CHOLHDL, LDLDIRECT in the last 72 hours. Thyroid function studies: No results for input(s): TSH, T4TOTAL, T3FREE, THYROIDAB in the last 72 hours.  Invalid input(s): FREET3 Anemia work up: No results for input(s): VITAMINB12, FOLATE, FERRITIN, TIBC, IRON, RETICCTPCT in the last 72 hours. Sepsis  Labs: Recent Labs  Lab 10/31/19 1848 11/03/19 0502 11/04/19 0511  WBC 7.6 6.1 5.9    Microbiology Recent Results (from the past 240 hour(s))  Respiratory Panel  by RT PCR (Flu A&B, Covid) - Nasopharyngeal Swab     Status: Abnormal   Collection Time: 10/31/19  8:56 PM   Specimen: Nasopharyngeal Swab  Result Value Ref Range Status   SARS Coronavirus 2 by RT PCR POSITIVE (A) NEGATIVE Final    Comment: RESULT CALLED TO, READ BACK BY AND VERIFIED WITH: Enis Gash SLADE 10/31/19 @ 2218 [place] (NOTE) SARS-CoV-2 target nucleic acids are DETECTED. SARS-CoV-2 RNA is generally detectable in upper respiratory specimens  during the acute phase of infection. Positive results are indicative of the presence of the identified virus, but do not rule out bacterial infection or co-infection with other pathogens not detected by the test. Clinical correlation with patient history and other diagnostic information is necessary to determine patient infection status. The expected result is Negative. Fact Sheet for Patients:  PinkCheek.be Fact Sheet for Healthcare Providers: GravelBags.it This test is not yet approved or cleared by the [place] FDA and  has been authorized for detection and/or diagnosis of SARS-CoV-2 by FDA under an Emergency Use Authorization (EUA).  This EUA will remain in effect (meaning this test can be used) for th e duration of  the COVID-19 declaration under Section 564(b)(1) of the Act, 21 U.S.C. section 360bbb-3(b)(1), unless the authorization is terminated or revoked sooner.    Influenza A by PCR NEGATIVE NEGATIVE Final   Influenza B by PCR NEGATIVE NEGATIVE Final    Comment: (NOTE) The Xpert Xpress SARS-CoV-2/FLU/RSV assay is intended as an aid in  the diagnosis of influenza from Nasopharyngeal swab specimens and  should not be used as a sole basis for treatment. Nasal washings and  aspirates are unacceptable for Xpert  Xpress SARS-CoV-2/FLU/RSV  testing. Fact Sheet for Patients: PinkCheek.be Fact Sheet for Healthcare Providers: GravelBags.it This test is not yet approved or cleared by the [place] FDA and  has been authorized for detection and/or diagnosis of SARS-CoV-2 by  FDA under an Emergency Use Authorization (EUA). This EUA will remain  in effect (meaning this test can be used) for the duration of the  Covid-19 declaration under Section 564(b)(1) of the Act, 21  U.S.C. section 360bbb-3(b)(1), unless the authorization is  terminated or revoked. Performed at [facility], [address] [place], [place] [address]   MRSA PCR Screening     Status: None   Collection Time: 10/31/19 11:11 PM   Specimen: Nasopharyngeal  Result Value Ref Range Status   MRSA by PCR NEGATIVE NEGATIVE Final    Comment:        The GeneXpert MRSA Assay (FDA approved for NASAL specimens only), is one component of a comprehensive MRSA colonization surveillance program. It is not intended to diagnose MRSA infection nor to guide or monitor treatment for MRSA infections. Performed at [facility], [address] [place], [place] [address]     Procedures and diagnostic studies:  No results found.  Medications:   . alteplase  2 mg Intracatheter Once  . alteplase  2 mg Intracatheter Once  . amLODipine  5 mg Oral Daily  . calcium acetate  1,334 mg Oral TID WC  . carvedilol  3.125 mg Oral BID WC  . Chlorhexidine Gluconate Cloth  6 each Topical Daily  . epoetin (EPOGEN/PROCRIT) injection  4,000 Units Intravenous Once  . furosemide  40 mg Oral BID  . gabapentin  800 mg Oral BID  . heparin  5,000 Units Subcutaneous Q8H  . hydrocerin   Topical BID  . insulin aspart  0-5 Units Subcutaneous  QHS  . insulin aspart  0-6 Units Subcutaneous TID WC  . lurasidone  60 mg Oral QHS  . sodium chloride flush  3 mL Intravenous Q12H   Continuous  Infusions: . sodium chloride       LOS: 6 days   Migdalia Olejniczak  [facility]   *Please refer to [email or web address], password [email or web address] to get updated schedule on who will round on this patient, as hospitalists switch teams weekly. If 7PM-7AM, please contact night-coverage at [email or web address], password [email or web address] for any overnight needs.  11/06/2019, 12:54 PM

## 2019-11-06 NOTE — Care Management Important Message (Signed)
Important Message  Patient Details  Name: Kathy Phelps MRN: [id number] Date of Birth: 12/21/1980   Medicare Important Message Given:  Yes  Reviewed verbally over room phone with patient due to isolation.   Dannette Barbara 11/06/2019, 3:10 PM

## 2019-11-06 NOTE — TOC Progression Note (Signed)
Transition of Care [facility]) - Progression Note    Patient Details  Name: Kathy Phelps MRN: [id number] Date of Birth: [birth year]  Transition of Care [facility]) CM/SW Contact  Shelbie Hutching, RN Phone Number: 11/06/2019, 12:10 PM  Clinical Narrative:    RNCM was able to speak with patient about discharge planning today.  Patient would like to go home, she reports that her children help her out some but she was doing most things by herself until she got so swollen she couldn't any more.  Patient agrees to having home health set up and chooses [facility].  Floydene Flock with [facility] accepts home health referral for PT, OT, aide and SW.   Patient reports that she understands that she needs to get in touch with her Medicaid Case worker so they can arrange transportation for her.  Patient reports that her niece will be able to pick her up at discharge and take her home.   RNCM will cont to follow.  Dialysis Coordinator with [facility] still working on dialysis clinic placement.    Expected Discharge Plan: [place] Barriers to Discharge: Continued Medical Work up  Expected Discharge Plan and Services Expected Discharge Plan: [place]   Discharge Planning Services: CM Consult Post Acute Care Choice: [place] arrangements for the past 2 months: Mobile Home                           HH Arranged: PT, OT, Nurse's Aide, Social Work [employer] Agency: [place] ([place]) Date [place]: 11/06/19 Time [place]: 1208 Representative spoke with at [place]: [place] (SDOH) Interventions    Readmission Risk Interventions Readmission Risk Prevention Plan 02/07/2019  Transportation Screening Complete  PCP or Specialist Appt within 3-5 Days Complete  HRI or [place] Complete  Social Work Consult for [place] Planning/Counseling Complete  Palliative Care  Screening Not Applicable  Medication Review Press photographer) Complete  Some recent data might be hidden

## 2019-11-06 NOTE — Progress Notes (Signed)
Ch spoke with Pt over the phone. Pt requested prayer for Kidneys to be healed, because they are shutting down.Ch prayed with Pt. Pt was concerned about medication in [facility] that she would need transportation to get to. Ch will check with nurse on the floor on these medication concerns she is talking about.   11/06/19 1617  Clinical Encounter Type  Visited With Patient  Visit Type Initial;Spiritual support  Referral From Other (Comment)  Consult/Referral To Other (Comment)  Spiritual Encounters  Spiritual Needs Prayer  Stress Factors  Patient Stress Factors Health changes;Loss of control;Major life changes

## 2019-11-06 NOTE — Progress Notes (Signed)
Per [facility] via secure chat he will change IV lasix to PO and no PIV needed at this time.

## 2019-11-07 LAB — CBC
HCT: 26.6 % — ABNORMAL LOW (ref 36.0–46.0)
Hemoglobin: 8.3 g/dL — ABNORMAL LOW (ref 12.0–15.0)
MCH: 30 pg (ref 26.0–34.0)
MCHC: 31.2 g/dL (ref 30.0–36.0)
MCV: 96 fL (ref 80.0–100.0)
Platelets: 114 10*3/uL — ABNORMAL LOW (ref 150–400)
RBC: 2.77 MIL/uL — ABNORMAL LOW (ref 3.87–5.11)
RDW: 15.1 % (ref 11.5–15.5)
WBC: 6.7 10*3/uL (ref 4.0–10.5)
nRBC: 0 % (ref 0.0–0.2)

## 2019-11-07 LAB — GLUCOSE, CAPILLARY
Glucose-Capillary: 98 mg/dL (ref 70–99)
Glucose-Capillary: 88 mg/dL (ref 70–99)
Glucose-Capillary: 105 mg/dL — ABNORMAL HIGH (ref 70–99)
Glucose-Capillary: 125 mg/dL — ABNORMAL HIGH (ref 70–99)

## 2019-11-07 NOTE — Progress Notes (Signed)
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 Echo showed EF estimated at 45 to 50%, mild LVH.  Was seen by vascular surgeon and continuous infusion of TPA of tunneled hemodialysis catheter was performed successfully on 11/02/2019.  Does not have an outpatient hemodialysis center as of yet and pt cannot be d/c w/o this being set up as per nephro. Nephro recs apprec. Continue oxycodone  DM2: well controlled w/ HbA1c 5.6 11/01/19. Continue on SSI w/ accuchecks   Hypertension: Continue amlodipine & coreg.  Thrombocytopenia: etiology unclear, possibly secondary to [place]. 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 No need for a transfusion at this time. Will continue to monitor   Generalized papular rash: Etiology unclear.  Recommended outpatient follow-up with dermatologist.  COVID-19 infection: asymptomatic.   Bipolar disorder: Continue latuda  PVD: s/p bilateral BKA   DVT prophylaxis: heparin Code Status: full  Family Communication:  Disposition Plan: awaiting for outpatient HD to be set up   Consultants:   nephro  Vascular surgery    Procedures:    Antimicrobials:    Subjective: Pt c/o fatigue   Objective: Vitals:   11/07/19 1130 11/07/19 1145 11/07/19 1200 11/07/19 1217  BP: (!) 157/85 (!) 158/104 (!) 164/86 (!) 151/71  Pulse: 85 84 86 89  Resp: 12 12 14 12   Temp:    97.9 F (36.6 C)  TempSrc:    Oral  SpO2:    92%  Weight:         Intake/Output Summary (Last 24 hours) at 11/07/2019 1257 Last data filed at 11/07/2019 1217 Gross per 24 hour  Intake --  Output 1700 ml  Net -1700 ml   Filed Weights   11/05/19 0500 11/06/19 0700 11/07/19 0624  Weight: 81 kg 79.9 kg 79.5 kg    Examination:  General exam: Appears calm and comfortable  Respiratory system: diminished breath sounds b/l  Cardiovascular system: S1 & S2 +. No rubs, gallops or clicks.  Gastrointestinal system: Abdomen is nondistended, soft and nontender.  Normal bowel sounds heard. Central nervous system: Alert and oriented.  Psychiatry: Judgement and insight appear normal. Mood & affect appropriate.     Data Reviewed: I have personally reviewed following labs and imaging studies  CBC: Recent Labs  Lab 10/31/19 1848 11/03/19 0502 11/04/19 0511 11/07/19 0606  WBC 7.6 6.1 5.9 6.7  NEUTROABS 4.5  --   --   --   HGB 8.3* 7.3* 7.8* 8.3*  HCT 26.4* 22.3* 24.5* 26.6*  MCV 95.0 91.4 94.6 96.0  PLT 144* 119* 133* [id number]*   Basic Metabolic Panel: Recent Labs  Lab 10/31/19 1848 10/31/19 1848 11/01/19 0637 11/02/19 0643 11/03/19 0502 11/04/19 0511 11/05/19 0431  NA 138   < > 142 139 139 138 141  K 4.5   < > 4.5 5.1 3.8 4.3 4.1  CL 113*   < > 117* 116* 110 108 109  CO2 14*   < > 15* 14* 21* 21* 22  GLUCOSE 142*   < > 80 73 91 78 87  BUN 62*   < > 63* 67* 38* 41* 41*  CREATININE 7.63*   < > 7.75* 8.05* 5.41* 5.99* 6.44*  CALCIUM 8.0*   < > 8.1* 8.1* 7.6* 8.1* 8.4*  MG 2.0  --   --   --   --  1.8  --   PHOS 6.6*  --   --   --   --   --   --    < > = values in this interval not displayed.   GFR: Estimated Creatinine Clearance: 13.1 mL/min (A) (by C-G formula based on SCr of 6.44 mg/dL (H)). Liver Function Tests: Recent Labs  Lab 10/31/19 1848  AST 14*  ALT 6  ALKPHOS 87  BILITOT 0.5  PROT 6.3*  ALBUMIN 2.8*   No results for input(s): LIPASE, AMYLASE in the last 168 hours. No results for input(s): AMMONIA in the last 168  hours. Coagulation Profile: No results for input(s): INR, PROTIME in the last 168 hours. Cardiac Enzymes: No results for input(s): CKTOTAL, CKMB, CKMBINDEX, TROPONINI in the last 168 hours. BNP (last 3 results) No results for input(s): PROBNP in the last 8760 hours. HbA1C: No results for input(s): HGBA1C in the last 72 hours. CBG: Recent Labs  Lab 11/06/19 1154 11/06/19 1632 11/06/19 1954 11/07/19 0735 11/07/19 1248  GLUCAP 116* 102* 110* 98 88   Lipid Profile: No results for input(s): CHOL, HDL, LDLCALC, TRIG, CHOLHDL, LDLDIRECT in the last 72 hours. Thyroid Function Tests: No results for input(s): TSH, T4TOTAL, FREET4, T3FREE, THYROIDAB in the last 72 hours. Anemia Panel: No results for input(s): VITAMINB12, FOLATE, FERRITIN, TIBC, IRON, RETICCTPCT in the last 72 hours. Sepsis Labs: No results for input(s): PROCALCITON, LATICACIDVEN in the last 168 hours.  Recent Results (from the past 240 hour(s))  Respiratory Panel by RT PCR (Flu A&B, Covid) - Nasopharyngeal Swab     Status: Abnormal   Collection Time: 10/31/19  8:56 PM   Specimen: Nasopharyngeal Swab  Result Value Ref Range Status   SARS Coronavirus 2 by RT PCR POSITIVE (A) NEGATIVE Final    Comment: RESULT CALLED TO, READ BACK BY AND VERIFIED WITH: Enis Gash SLADE 10/31/19 @ 2218 [place]ISH (NOTE) SARS-CoV-2 target nucleic acids are DETECTED. SARS-CoV-2 RNA is generally detectable in upper respiratory specimens  during the acute phase of infection. Positive results are indicative of the presence of the identified virus, but do not rule out bacterial infection or co-infection with other pathogens not detected by the test. Clinical correlation with patient history and other diagnostic information is necessary to determine patient infection status. The expected result is Negative. Fact Sheet for Patients:  PinkCheek.be Fact Sheet for Healthcare  Providers: GravelBags.it This test is not yet approved or cleared by the [place] FDA and  has been authorized for detection and/or diagnosis of SARS-CoV-2 by FDA under an Emergency Use Authorization (EUA).  This EUA will remain in effect (meaning this test can be used) for th e duration of  the COVID-19 declaration under Section 564(b)(1) of the Act, 21 U.S.C. section 360bbb-3(b)(1), unless the authorization is terminated or revoked sooner.    Influenza A by PCR NEGATIVE NEGATIVE Final   Influenza B by PCR NEGATIVE NEGATIVE Final    Comment: (NOTE) The Xpert Xpress SARS-CoV-2/FLU/RSV assay is intended as an aid in  the diagnosis of influenza from Nasopharyngeal swab specimens and  should not be used as a sole basis for treatment. Nasal washings  and  aspirates are unacceptable for Xpert Xpress SARS-CoV-2/FLU/RSV  testing. Fact Sheet for Patients: PinkCheek.be Fact Sheet for Healthcare Providers: GravelBags.it This test is not yet approved or cleared by the [place] FDA and  has been authorized for detection and/or diagnosis of SARS-CoV-2 by  FDA under an Emergency Use Authorization (EUA). This EUA will remain  in effect (meaning this test can be used) for the duration of the  Covid-19 declaration under Section 564(b)(1) of the Act, 21  U.S.C. section 360bbb-3(b)(1), unless the authorization is  terminated or revoked. Performed at [facility], [address] [place], [place] [address]   MRSA PCR Screening     Status: None   Collection Time: 10/31/19 11:11 PM   Specimen: Nasopharyngeal  Result Value Ref Range Status   MRSA by PCR NEGATIVE NEGATIVE Final    Comment:        The GeneXpert MRSA Assay (FDA approved for NASAL specimens only), is one component of a comprehensive MRSA colonization surveillance program. It is not intended to diagnose MRSA infection nor to guide  or monitor treatment for MRSA infections. Performed at [facility], [address] [place], [place] [address]          Radiology Studies: No results found.      Scheduled Meds: . alteplase  2 mg Intracatheter Once  . alteplase  2 mg Intracatheter Once  . amLODipine  5 mg Oral Daily  . calcium acetate  1,334 mg Oral TID WC  . carvedilol  3.125 mg Oral BID WC  . Chlorhexidine Gluconate Cloth  6 each Topical Daily  . epoetin (EPOGEN/PROCRIT) injection  4,000 Units Intravenous Once  . furosemide  40 mg Oral BID  . gabapentin  800 mg Oral BID  . heparin  5,000 Units Subcutaneous Q8H  . hydrocerin   Topical BID  . insulin aspart  0-5 Units Subcutaneous QHS  . insulin aspart  0-6 Units Subcutaneous TID WC  . lurasidone  60 mg Oral QHS  . sodium chloride flush  3 mL Intravenous Q12H   Continuous Infusions: . sodium chloride       LOS: 7 days    Time spent: 30 mins    Wyvonnia Dusky, MD [facility] Pager [phone]  If 7PM-7AM, please contact night-coverage [email or web address] Password [facility] 11/07/2019, 12:57 PM

## 2019-11-07 NOTE — Progress Notes (Signed)
[facility], [place] 11/07/19  Subjective:  Patient seen and evaluated during hemodialysis. Tolerating well.    HEMODIALYSIS FLOWSHEET:  Blood Flow Rate (mL/min): 400 mL/min Arterial Pressure (mmHg): -170 mmHg Venous Pressure (mmHg): 130 mmHg Transmembrane Pressure (mmHg): 60 mmHg Ultrafiltration Rate (mL/min): 600 mL/min Dialysate Flow Rate (mL/min): 600 ml/min Conductivity: Machine : 15.5 Conductivity: Machine : 15.5 Dialysis Fluid Bolus: Normal Saline Bolus Amount (mL): 250 mL Dialysate Change: 2K    Objective:  Vital signs in last 24 hours:  Temp:  [97.8 F (36.6 C)-98.9 F (37.2 C)] 98.9 F (37.2 C) (01/27 0940) Pulse Rate:  [81-95] 86 (01/27 1200) Resp:  [9-19] 14 (01/27 1200) BP: (109-164)/(73-104) 164/86 (01/27 1200) SpO2:  [92 %-98 %] 96 % (01/27 1015) Weight:  [79.5 kg] 79.5 kg (01/27 0624)  Weight change: -0.4 kg Filed Weights   11/05/19 0500 11/06/19 0700 11/07/19 0624  Weight: 81 kg 79.9 kg 79.5 kg    Intake/Output:    Intake/Output Summary (Last 24 hours) at 11/07/2019 1219 Last data filed at 11/07/2019 [id number] Gross per 24 hour  Intake --  Output 700 ml  Net -700 ml    Physical Exam: General:  No acute distress, laying in the bed  HEENT  anicteric, moist oral mucous membranes  Lungs:  Normal breathing effort  Heart::  no rubs  Abdomen:  Soft, NTND  Extremities:  Bilateral leg amputations  Neurologic:  Alert, able to answer questions  Skin:  Prurigo nodules over abdomen and back  Access:  Right IJ PermCath      Basic Metabolic Panel:  Recent Labs  Lab 10/31/19 1848 10/31/19 1848 11/01/19 [id number] 11/01/19 [id number] 11/02/19 0643 11/02/19 0643 11/03/19 0502 11/04/19 0511 11/05/19 0431  NA 138   < > 142  --  139  --  139 138 141  K 4.5   < > 4.5  --  5.1  --  3.8 4.3 4.1  CL 113*   < > 117*  --  116*  --  110 108 109  CO2 14*   < > 15*  --  14*  --  21* 21* 22  GLUCOSE 142*   < > 80  --  73  --  91 78 87   BUN 62*   < > 63*  --  67*  --  38* 41* 41*  CREATININE 7.63*   < > 7.75*  --  8.05*  --  5.41* 5.99* 6.44*  CALCIUM 8.0*   < > 8.1*   < > 8.1*   < > 7.6* 8.1* 8.4*  MG 2.0  --   --   --   --   --   --  1.8  --   PHOS 6.6*  --   --   --   --   --   --   --   --    < > = values in this interval not displayed.     CBC: Recent Labs  Lab 10/31/19 1848 11/03/19 0502 11/04/19 0511 11/07/19 0606  WBC 7.6 6.1 5.9 6.7  NEUTROABS 4.5  --   --   --   HGB 8.3* 7.3* 7.8* 8.3*  HCT 26.4* 22.3* 24.5* 26.6*  MCV 95.0 91.4 94.6 96.0  PLT 144* 119* 133* 114*      Lab Results  Component Value Date   HEPBSAG NON REACTIVE 11/01/2019   HEPBSAB NON REACTIVE 11/01/2019      Microbiology:  Recent Results (from the past  240 hour(s))  Respiratory Panel by RT PCR (Flu A&B, Covid) - Nasopharyngeal Swab     Status: Abnormal   Collection Time: 10/31/19  8:56 PM   Specimen: Nasopharyngeal Swab  Result Value Ref Range Status   SARS Coronavirus 2 by RT PCR POSITIVE (A) NEGATIVE Final    Comment: RESULT CALLED TO, READ BACK BY AND VERIFIED WITH: Enis Gash SLADE 10/31/19 @ 2218 [place] (NOTE) SARS-CoV-2 target nucleic acids are DETECTED. SARS-CoV-2 RNA is generally detectable in upper respiratory specimens  during the acute phase of infection. Positive results are indicative of the presence of the identified virus, but do not rule out bacterial infection or co-infection with other pathogens not detected by the test. Clinical correlation with patient history and other diagnostic information is necessary to determine patient infection status. The expected result is Negative. Fact Sheet for Patients:  PinkCheek.be Fact Sheet for Healthcare Providers: GravelBags.it This test is not yet approved or cleared by the [place] FDA and  has been authorized for detection and/or diagnosis of SARS-CoV-2 by FDA under an Emergency Use Authorization (EUA).   This EUA will remain in effect (meaning this test can be used) for th e duration of  the COVID-19 declaration under Section 564(b)(1) of the Act, 21 U.S.C. section 360bbb-3(b)(1), unless the authorization is terminated or revoked sooner.    Influenza A by PCR NEGATIVE NEGATIVE Final   Influenza B by PCR NEGATIVE NEGATIVE Final    Comment: (NOTE) The Xpert Xpress SARS-CoV-2/FLU/RSV assay is intended as an aid in  the diagnosis of influenza from Nasopharyngeal swab specimens and  should not be used as a sole basis for treatment. Nasal washings and  aspirates are unacceptable for Xpert Xpress SARS-CoV-2/FLU/RSV  testing. Fact Sheet for Patients: PinkCheek.be Fact Sheet for Healthcare Providers: GravelBags.it This test is not yet approved or cleared by the [place] FDA and  has been authorized for detection and/or diagnosis of SARS-CoV-2 by  FDA under an Emergency Use Authorization (EUA). This EUA will remain  in effect (meaning this test can be used) for the duration of the  Covid-19 declaration under Section 564(b)(1) of the Act, 21  U.S.C. section 360bbb-3(b)(1), unless the authorization is  terminated or revoked. Performed at [facility], [address] [place], [place] [address]   MRSA PCR Screening     Status: None   Collection Time: 10/31/19 11:11 PM   Specimen: Nasopharyngeal  Result Value Ref Range Status   MRSA by PCR NEGATIVE NEGATIVE Final    Comment:        The GeneXpert MRSA Assay (FDA approved for NASAL specimens only), is one component of a comprehensive MRSA colonization surveillance program. It is not intended to diagnose MRSA infection nor to guide or monitor treatment for MRSA infections. Performed at [facility], [address] [place], [place] [address]     Coagulation Studies: No results for input(s): LABPROT, INR in the last 72 hours.  Urinalysis: No results for  input(s): COLORURINE, LABSPEC, PHURINE, GLUCOSEU, HGBUR, BILIRUBINUR, KETONESUR, PROTEINUR, UROBILINOGEN, NITRITE, LEUKOCYTESUR in the last 72 hours.  Invalid input(s): APPERANCEUR    Imaging: No results found.   Medications:   . sodium chloride     . alteplase  2 mg Intracatheter Once  . alteplase  2 mg Intracatheter Once  . amLODipine  5 mg Oral Daily  . calcium acetate  1,334 mg Oral TID WC  . carvedilol  3.125 mg Oral BID WC  . Chlorhexidine Gluconate Cloth  6  each Topical Daily  . epoetin (EPOGEN/PROCRIT) injection  4,000 Units Intravenous Once  . furosemide  40 mg Oral BID  . gabapentin  800 mg Oral BID  . heparin  5,000 Units Subcutaneous Q8H  . hydrocerin   Topical BID  . insulin aspart  0-5 Units Subcutaneous QHS  . insulin aspart  0-6 Units Subcutaneous TID WC  . lurasidone  60 mg Oral QHS  . sodium chloride flush  3 mL Intravenous Q12H   sodium chloride, acetaminophen, ALPRAZolam, diphenhydrAMINE, heparin, hydrALAZINE, ondansetron (ZOFRAN) IV, oxyCODONE, sodium chloride flush  Assessment/ Plan:  39 y.o. 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 ESRD with LE edema Patient seen during dialysis treatment.  Tolerating well.  We plan to complete dialysis treatment today.  Outpatient hemodialysis center placement still pending.  #. Anemia of CKD  Lab Results  Component Value Date   HGB 8.3 (L) 11/07/2019  Patient to be administered Epogen 4000 IV with dialysis today.  #. SHPTH      Component Value Date/Time   PTH NOT PERFORMED 06/16/2019 0611   Lab Results  Component Value Date   PHOS 6.6 (H) 10/31/2019   Maintain the patient on calcium acetate 2 tablets p.o. 3 times daily with meals.     LOS: 7 Fannye Myer 1/27/202112:19 PM  [place], [place]

## 2019-11-08 LAB — CBC
HCT: 22.4 % — ABNORMAL LOW (ref 36.0–46.0)
Hemoglobin: 7 g/dL — ABNORMAL LOW (ref 12.0–15.0)
MCH: 29.7 pg (ref 26.0–34.0)
MCHC: 31.3 g/dL (ref 30.0–36.0)
MCV: 94.9 fL (ref 80.0–100.0)
Platelets: 89 10*3/uL — ABNORMAL LOW (ref 150–400)
RBC: 2.36 MIL/uL — ABNORMAL LOW (ref 3.87–5.11)
RDW: 14.8 % (ref 11.5–15.5)
WBC: 6.3 10*3/uL (ref 4.0–10.5)
nRBC: 0 % (ref 0.0–0.2)

## 2019-11-08 LAB — GLUCOSE, CAPILLARY
Glucose-Capillary: 100 mg/dL — ABNORMAL HIGH (ref 70–99)
Glucose-Capillary: 89 mg/dL (ref 70–99)

## 2019-11-08 LAB — PHOSPHORUS: Phosphorus: 2.5 mg/dL (ref 2.5–4.6)

## 2019-11-08 LAB — BASIC METABOLIC PANEL
Anion gap: 9 (ref 5–15)
BUN: 16 mg/dL (ref 6–20)
CO2: 29 mmol/L (ref 22–32)
Calcium: 8 mg/dL — ABNORMAL LOW (ref 8.9–10.3)
Chloride: 102 mmol/L (ref 98–111)
Creatinine, Ser: 3.69 mg/dL — ABNORMAL HIGH (ref 0.44–1.00)
GFR calc Af Amer: 17 mL/min — ABNORMAL LOW (ref >60)
GFR calc non Af Amer: 15 mL/min — ABNORMAL LOW (ref >60)
Glucose, Bld: 103 mg/dL — ABNORMAL HIGH (ref 70–99)
Potassium: 3.7 mmol/L (ref 3.5–5.1)
Sodium: 140 mmol/L (ref 135–145)

## 2019-11-08 NOTE — Discharge Summary (Signed)
Physician Discharge Summary  Kathy Phelps [id number] DOB: Jan 31, 1981 DOA: 10/31/2019  PCP: Davonna Belling, MD  Admit date: 10/31/2019 Discharge date: 11/08/2019  Admitted From: home Discharge to: home  Recommendations for Outpatient Follow-up:  1. Follow up with PCP in 1-2 weeks 2. F/u nephro in 1 week   Home Health: yes Equipment/Devices:  Discharge Condition: stable CODE STATUS: full  Diet recommendation: Heart Healthy / Carb Modified  Brief/Interim Summary: HPI was taken from Dr. Damita Dunnings: Kathy Phelps is a 39 y.o. 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  She states that she missed dialysis for the past 2 months because she did not have a ride.  She is short of breath only when she lays flat but feels fine when she is sitting up.  She denies cough, chest pain, fever or chills.  She does complain of a rash on her back and abdomen for the past month that is very pruritic.  ED Course: On arrival in the emergency room, blood pressure was 176/103 with O2 sat 100% on room air and otherwise normal vitals.  She appeared comfortable.  BNP was elevated at 2533.  Potassium 4.5 and bicarb 14.  Creatinine 7.63.  Chest x-ray showed cardiomegaly with vascular congestion and developing pulmonary edema with small to moderate-sized bilateral pleural effusions and also showed a well-positioned tunneled dialysis catheter.  Patient was given IV Lasix in the emergency room and hospitalist consulted for admission.  Hospital Course from Dr. Lenise Herald 1/27-1/28/21: Pt was found to be fluid overloaded secondary to acute systolic CHF, missed HD as well  dysfunctional permacath. Pt was  seen by vascular surgeon and continuous infusion of TPA of tunneled hemodialysis catheter was performed successfully on 11/02/2019. Outpatient HD was set up but pt also needed transportation to HD as well and pt was told she needs to contact medicaid to set up her transportation. Unfortunately, pt could not wait for her to be d/c orders today as the pt has 3 kids at home and her babysitter was leaving so pt left AMA.  Please see previous progress notes prior to 11/07/19 for more information.   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 Echo showed EF estimated at 45 to 50%, mild LVH.  Was seen by vascular surgeon and continuous infusion of TPA of tunneled hemodialysis catheter was performed successfully on 11/02/2019. Outpatient HD was set up but also needed transportation to HD as well and pt was told she needs to contact Medicaid to set up her transportation.  Nephro recs apprec. Continue oxycodone  DM2: well controlled w/ HbA1c 5.6 11/01/19. Continue on SSI w/ accuchecks   Hypertension: Continue amlodipine & coreg.  Thrombocytopenia: etiology unclear, possibly secondary to [place]. 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 H&H continues to trend down. Will continue to monitor   Generalized papular rash: Etiology  unclear.  Recommended outpatient follow-up with dermatologist.  COVID-19 infection: asymptomatic.   Bipolar disorder: Continue latuda  PVD: s/p bilateral BKA   Discharge Instructions   #### Pt left AMA so d/c orders were placed  Allergies   Allergen Reactions  . Hydrocodone-Acetaminophen Hives  . Tylenol [Acetaminophen] Itching and Swelling    Patient tolerated APAP 650 mg (06/29/18) as well as oxycodone/APAP 5-325 mg during 06/25/2018 admission ??    Consultations:  Vascular surg  nephro    Procedures/Studies: DG Chest 1 View  Result Date: 10/31/2019 CLINICAL DATA:  Shortness of breath EXAM: CHEST  1 VIEW COMPARISON:  August 22, 2019 FINDINGS: There is a well-positioned tunneled dialysis catheter on the right. The heart size is enlarged. Aortic calcifications are noted. There are small to moderate-sized bilateral pleural effusions. There is vascular congestion with early developing pulmonary edema. There is no pneumothorax. No acute osseous abnormality. IMPRESSION: 1. Cardiomegaly with vascular congestion and developing pulmonary edema. 2. Small to moderate-sized bilateral pleural effusions. 3. Well-positioned tunneled dialysis catheter. Electronically Signed   By: Constance Holster M.D.   On: 10/31/2019 19:34   ECHOCARDIOGRAM COMPLETE  Result Date: 11/01/2019   ECHOCARDIOGRAM REPORT   Patient Name:   Kathy Phelps Date of Exam: 11/01/2019 Medical Rec #:  [id number]       Height:       69.0 in Accession #:    [id number]      Weight:       159.5 lb Date of Birth:  09-Aug-1981        BSA:          1.88 m Patient Age:    60 years        BP:           152/99 mmHg Patient Gender: F               HR:           94 bpm. Exam Location:  [facility] Procedure: 2D Echo, Color Doppler and Cardiac Doppler Indications:     CHF-cute diastolic [id number]  History:         Patient has prior history of Echocardiogram examinations, most                  recent 08/20/2019. Stroke; Risk Factors:Hypertension and                  Diabetes. Schizophrenia.  Sonographer:     Sherrie Sport RDCS (AE) Referring Phys:  [id number] Athena Masse Diagnosing Phys: Yolonda Kida MD IMPRESSIONS  1. Left ventricular ejection fraction, by visual estimation, is 45 to 50%. The left  ventricle has low normal function. There is mildly increased left ventricular hypertrophy.  2. The left ventricle has no regional wall motion abnormalities.  3. Global right ventricle has normal systolic function.The right ventricular size is normal. No increase in right ventricular wall thickness.  4. Left atrial size was normal.  5. Right atrial size was normal.  6. The mitral valve is normal in structure. No evidence of mitral valve regurgitation.  7. The tricuspid valve is normal in structure.  8. The tricuspid valve is normal in structure. Tricuspid valve regurgitation is trivial.  9. The aortic valve is normal in structure. Aortic valve regurgitation is not visualized. 10. The pulmonic valve was grossly normal. Pulmonic valve regurgitation is not visualized. 11. Moderately elevated pulmonary artery systolic pressure. FINDINGS  Left Ventricle: Left ventricular ejection fraction, by visual estimation, is  45 to 50%. The left ventricle has low normal function. The left ventricle has no regional wall motion abnormalities. There is mildly increased left ventricular hypertrophy. Left ventricular diastolic parameters were normal. Right Ventricle: The right ventricular size is normal. No increase in right ventricular wall thickness. Global RV systolic function is has normal systolic function. The tricuspid regurgitant velocity is 3.06 m/s, and with an assumed right atrial pressure  of 10 mmHg, the estimated right ventricular systolic pressure is moderately elevated at 47.5 mmHg. Left Atrium: Left atrial size was normal in size. Right Atrium: Right atrial size was normal in size Pericardium: There is no evidence of pericardial effusion. Mitral Valve: The mitral valve is normal in structure. No evidence of mitral valve regurgitation. Tricuspid Valve: The tricuspid valve is normal in structure. Tricuspid valve regurgitation is trivial. Aortic Valve: The aortic valve is normal in structure. Aortic valve regurgitation is  not visualized. Aortic valve mean gradient measures 4.0 mmHg. Aortic valve peak gradient measures 5.9 mmHg. Aortic valve area, by VTI measures 2.33 cm. Pulmonic Valve: The pulmonic valve was grossly normal. Pulmonic valve regurgitation is not visualized. Pulmonic regurgitation is not visualized. Aorta: The aortic root is normal in size and structure. IAS/Shunts: No atrial level shunt detected by color flow Doppler.  LEFT VENTRICLE PLAX 2D LVIDd:         5.77 cm  Diastology LVIDs:         3.79 cm  LV e' lateral:   6.20 cm/s LV PW:         1.71 cm  LV E/e' lateral: 18.4 LV IVS:        0.97 cm  LV e' medial:    5.11 cm/s LVOT diam:     2.00 cm  LV E/e' medial:  22.3 LV SV:         103 ml LV SV Index:   54.77 LVOT Area:     3.14 cm  RIGHT VENTRICLE RV Basal diam:  4.51 cm LEFT ATRIUM           Index       RIGHT ATRIUM           Index LA diam:      4.30 cm 2.29 cm/m  RA Area:     12.80 cm LA Vol (A2C): 64.5 ml 34.37 ml/m RA Volume:   29.30 ml  15.61 ml/m LA Vol (A4C): 51.0 ml 27.18 ml/m  AORTIC VALVE                   PULMONIC VALVE AV Area (Vmax):    2.73 cm    PV Vmax:        0.61 m/s AV Area (Vmean):   2.41 cm    PV Peak grad:   1.5 mmHg AV Area (VTI):     2.33 cm    RVOT Peak grad: 2 mmHg AV Vmax:           121.00 cm/s AV Vmean:          90.100 cm/s AV VTI:            0.193 m AV Peak Grad:      5.9 mmHg AV Mean Grad:      4.0 mmHg LVOT Vmax:         105.00 cm/s LVOT Vmean:        69.100 cm/s LVOT VTI:          0.143 m LVOT/AV VTI ratio: 0.74  AORTA Ao  Root diam: 3.30 cm MITRAL VALVE                         TRICUSPID VALVE MV Area (PHT): 6.32 cm              TR Peak grad:   37.5 mmHg MV PHT:        34.80 msec            TR Vmax:        316.00 cm/s MV Decel Time: 120 msec MV E velocity: 114.00 cm/s 103 cm/s  SHUNTS MV A velocity: 73.70 cm/s  70.3 cm/s Systemic VTI:  0.14 m MV E/A ratio:  1.55        1.5       Systemic Diam: 2.00 cm  Dwayne D Callwood MD Electronically signed by Yolonda Kida MD Signature  Date/Time: 11/01/2019/2:06:52 PM    Final        Subjective: Pt c/o itching   Discharge Exam: Vitals:   11/08/19 0000 11/08/19 0730  BP: (!) 151/82 129/66  Pulse: 90   Resp: 11 13  Temp:  97.8 F (36.6 C)  SpO2: 94%    Vitals:   11/07/19 2346 11/08/19 0000 11/08/19 0514 11/08/19 0730  BP:  (!) 151/82  129/66  Pulse:  90    Resp:  11  13  Temp: 98.9 F (37.2 C)   97.8 F (36.6 C)  TempSrc:    Oral  SpO2:  94%    Weight:   77.3 kg     General: Pt is alert, awake, not in acute distress Cardiovascular:  S1/S2 +, no rubs, no gallops Respiratory: decreased breath sounds b/l.  Abdominal: Soft, NT, ND, bowel sounds + Extremities: no edema, no cyanosis    The results of significant diagnostics from this hospitalization (including imaging, microbiology, ancillary and laboratory) are listed below for reference.     Microbiology: Recent Results (from the past 240 hour(s))  Respiratory Panel by RT PCR (Flu A&B, Covid) - Nasopharyngeal Swab     Status: Abnormal   Collection Time: 10/31/19  8:56 PM   Specimen: Nasopharyngeal Swab  Result Value Ref Range Status   SARS Coronavirus 2 by RT PCR POSITIVE (A) NEGATIVE Final    Comment: RESULT CALLED TO, READ BACK BY AND VERIFIED WITH: Enis Gash SLADE 10/31/19 @ 2218 [place] (NOTE) SARS-CoV-2 target nucleic acids are DETECTED. SARS-CoV-2 RNA is generally detectable in upper respiratory specimens  during the acute phase of infection. Positive results are indicative of the presence of the identified virus, but do not rule out bacterial infection or co-infection with other pathogens not detected by the test. Clinical correlation with patient history and other diagnostic information is necessary to determine patient infection status. The expected result is Negative. Fact Sheet for Patients:  PinkCheek.be Fact Sheet for Healthcare Providers: GravelBags.it This test is not yet  approved or cleared by the [place] FDA and  has been authorized for detection and/or diagnosis of SARS-CoV-2 by FDA under an Emergency Use Authorization (EUA).  This EUA will remain in effect (meaning this test can be used) for th e duration of  the COVID-19 declaration under Section 564(b)(1) of the Act, 21 U.S.C. section 360bbb-3(b)(1), unless the authorization is terminated or revoked sooner.    Influenza A by PCR NEGATIVE NEGATIVE Final   Influenza B by PCR NEGATIVE NEGATIVE Final    Comment: (NOTE) The Xpert Xpress SARS-CoV-2/FLU/RSV assay is intended as an  aid in  the diagnosis of influenza from Nasopharyngeal swab specimens and  should not be used as a sole basis for treatment. Nasal washings and  aspirates are unacceptable for Xpert Xpress SARS-CoV-2/FLU/RSV  testing. Fact Sheet for Patients: PinkCheek.be Fact Sheet for Healthcare Providers: GravelBags.it This test is not yet approved or cleared by the [place] FDA and  has been authorized for detection and/or diagnosis of SARS-CoV-2 by  FDA under an Emergency Use Authorization (EUA). This EUA will remain  in effect (meaning this test can be used) for the duration of the  Covid-19 declaration under Section 564(b)(1) of the Act, 21  U.S.C. section 360bbb-3(b)(1), unless the authorization is  terminated or revoked. Performed at [facility], [address] [place], [place] [address]   MRSA PCR Screening     Status: None   Collection Time: 10/31/19 11:11 PM   Specimen: Nasopharyngeal  Result Value Ref Range Status   MRSA by PCR NEGATIVE NEGATIVE Final    Comment:        The GeneXpert MRSA Assay (FDA approved for NASAL specimens only), is one component of a comprehensive MRSA colonization surveillance program. It is not intended to diagnose MRSA infection nor to guide or monitor treatment for MRSA infections. Performed at [facility], [address] [place], [place] [address]      Labs: BNP (last 3 results) Recent Labs    10/31/19 1841 11/04/19 0511  BNP 2,533.0* [id number]*   Basic Metabolic Panel: Recent Labs  Lab 11/02/19 0643 11/03/19 0502 11/04/19 0511 11/05/19 0431 11/08/19 0007 11/08/19 0255  NA 139 139 138 141  --  140  K 5.1 3.8 4.3 4.1  --  3.7  CL 116* 110 108 109  --  102  CO2 14* 21* 21* 22  --  29  GLUCOSE 73 91 78 87  --  103*  BUN 67* 38* 41* 41*  --  16  CREATININE 8.05* 5.41* 5.99* 6.44*  --  3.69*  CALCIUM 8.1* 7.6* 8.1* 8.4*  --  8.0*  MG  --   --  1.8  --   --   --   PHOS  --   --   --   --  2.5  --    Liver Function Tests: No results for input(s): AST, ALT, ALKPHOS, BILITOT, PROT, ALBUMIN in the last 168 hours. No results for input(s): LIPASE, AMYLASE in the last 168 hours. No results for input(s): AMMONIA in the last 168 hours. CBC: Recent Labs  Lab 11/03/19 0502 11/04/19 0511 11/07/19 0606 11/08/19 0255  WBC 6.1 5.9 6.7 6.3  HGB 7.3* 7.8* 8.3* 7.0*  HCT 22.3* 24.5* 26.6* 22.4*  MCV 91.4 94.6 96.0 94.9  PLT 119* 133* 114* 89*   Cardiac Enzymes: No results for input(s): CKTOTAL, CKMB, CKMBINDEX, TROPONINI in the last 168 hours. BNP: Invalid input(s): POCBNP CBG: Recent Labs  Lab 11/07/19 1248 11/07/19 1629 11/07/19 2056 11/08/19 0729 11/08/19 1114  GLUCAP 88 105* 125* 100* 89   D-Dimer No results for input(s): DDIMER in the last 72 hours. Hgb A1c No results for input(s): HGBA1C in the last 72 hours. Lipid Profile No results for input(s): CHOL, HDL, LDLCALC, TRIG, CHOLHDL, LDLDIRECT in the last 72 hours. Thyroid function studies No results for input(s): TSH, T4TOTAL, T3FREE, THYROIDAB in the last 72 hours.  Invalid input(s): FREET3 Anemia work up No results for input(s): VITAMINB12, FOLATE, FERRITIN, TIBC, IRON, RETICCTPCT in the last 72 hours. Urinalysis  Component Value Date/Time   COLORURINE YELLOW 06/15/2019 2252   APPEARANCEUR CLOUDY (A)  06/15/2019 2252   LABSPEC 1.012 06/15/2019 2252   PHURINE 8.0 06/15/2019 2252   GLUCOSEU NEGATIVE 06/15/2019 2252   HGBUR NEGATIVE 06/15/2019 2252   BILIRUBINUR NEGATIVE 06/15/2019 2252   KETONESUR NEGATIVE 06/15/2019 2252   PROTEINUR 100 (A) 06/15/2019 2252   UROBILINOGEN 1.0 02/26/2015 1152   NITRITE NEGATIVE 06/15/2019 2252   LEUKOCYTESUR NEGATIVE 06/15/2019 2252   Sepsis Labs Invalid input(s): PROCALCITONIN,  WBC,  LACTICIDVEN Microbiology Recent Results (from the past 240 hour(s))  Respiratory Panel by RT PCR (Flu A&B, Covid) - Nasopharyngeal Swab     Status: Abnormal   Collection Time: 10/31/19  8:56 PM   Specimen: Nasopharyngeal Swab  Result Value Ref Range Status   SARS Coronavirus 2 by RT PCR POSITIVE (A) NEGATIVE Final    Comment: RESULT CALLED TO, READ BACK BY AND VERIFIED WITH: Anderson Malta 10/31/19 @ 2218 [place] (NOTE) SARS-CoV-2 target nucleic acids are DETECTED. SARS-CoV-2 RNA is generally detectable in upper respiratory specimens  during the acute phase of infection. Positive results are indicative of the presence of the identified virus, but do not rule out bacterial infection or co-infection with other pathogens not detected by the test. Clinical correlation with patient history and other diagnostic information is necessary to determine patient infection status. The expected result is Negative. Fact Sheet for Patients:  PinkCheek.be Fact Sheet for Healthcare Providers: GravelBags.it This test is not yet approved or cleared by the [place] FDA and  has been authorized for detection and/or diagnosis of SARS-CoV-2 by FDA under an Emergency Use Authorization (EUA).  This EUA will remain in effect (meaning this test can be used) for th e duration of  the COVID-19 declaration under Section 564(b)(1) of the Act, 21 U.S.C. section 360bbb-3(b)(1), unless the authorization is terminated or revoked sooner.     Influenza A by PCR NEGATIVE NEGATIVE Final   Influenza B by PCR NEGATIVE NEGATIVE Final    Comment: (NOTE) The Xpert Xpress SARS-CoV-2/FLU/RSV assay is intended as an aid in  the diagnosis of influenza from Nasopharyngeal swab specimens and  should not be used as a sole basis for treatment. Nasal washings and  aspirates are unacceptable for Xpert Xpress SARS-CoV-2/FLU/RSV  testing. Fact Sheet for Patients: PinkCheek.be Fact Sheet for Healthcare Providers: GravelBags.it This test is not yet approved or cleared by the [place] FDA and  has been authorized for detection and/or diagnosis of SARS-CoV-2 by  FDA under an Emergency Use Authorization (EUA). This EUA will remain  in effect (meaning this test can be used) for the duration of the  Covid-19 declaration under Section 564(b)(1) of the Act, 21  U.S.C. section 360bbb-3(b)(1), unless the authorization is  terminated or revoked. Performed at [facility], [address] [place], [place] [address]   MRSA PCR Screening     Status: None   Collection Time: 10/31/19 11:11 PM   Specimen: Nasopharyngeal  Result Value Ref Range Status   MRSA by PCR NEGATIVE NEGATIVE Final    Comment:        The GeneXpert MRSA Assay (FDA approved for NASAL specimens only), is one component of a comprehensive MRSA colonization surveillance program. It is not intended to diagnose MRSA infection nor to guide or monitor treatment for MRSA infections. Performed at [facility], [address], [place], [place] [address]      Time coordinating discharge: Over 30 minutes  SIGNED:   August Saucer  Jimmye Norman, MD  [facility] 11/08/2019, 1:34 PM Pager   If 7PM-7AM, please contact night-coverage [email or web address] Password [email or web address]

## 2019-11-08 NOTE — Progress Notes (Signed)
Patient has been accepted at [facility] 9:00, start on Saturday at 9:00 for Cohort shift. Patient will need to provide own transportation or contact Case Worker at [place] to arrange transportation. Patient is aware of this. Patient also accepted at [facility] 11:30 after isolation is finished. Patient will also need to arrange transportation the same way through Social Service Department.

## 2019-11-08 NOTE — TOC Transition Note (Addendum)
Transition of Care [facility] - CM/SW Discharge Note   Patient Details  Name: Kathy Phelps MRN: [id number] Date of Birth: 03/13/1981  [facility] CM/SW Contact:  Shelbie Hutching, RN Phone Number: 11/08/2019, 1:04 PM   Clinical Narrative:    Patient would not wait for MD to discharge her home and decided to leave the hospital AMA, home health services will still go out to see her.  Floydene Flock with [facility] has agreed to accept home health referral for PT, OT, aide, and social work.  Patient reports that a friend will come and pick her up.  Dialysis Coordinator has arranged a dialysis clinic for the patient and has given all information to the patient.  Patient also has been told more that once that she needs to contact Medicaid and set up transportation.    Final next level of care: [place] Barriers to Discharge: Barriers Resolved   Patient Goals and CMS Choice Patient states their goals for this hospitalization and ongoing recovery are:: wants to go back home CMS Medicare.gov Compare Post Acute Care list provided to:: Patient Choice offered to / list presented to : Patient  Discharge Placement                       Discharge Plan and Services   Discharge Planning Services: CM Consult Post Acute Care Choice: Home Health                    HH Arranged: PT, OT, Nurse's Aide, Social Work [facility] Agency: [place] ([place]) Date [place]: 11/06/19 Time [place]: 1208 Representative spoke with at [place]: [place] (SDOH) Interventions     Readmission Risk Interventions Readmission Risk Prevention Plan 02/07/2019  Transportation Screening Complete  PCP or Specialist Appt within 3-5 Days Complete  HRI or [place] Complete  Social Work Consult for [place] Planning/Counseling Complete  Palliative Care Screening Not Applicable  Medication Review Human resources officer) Complete  Some recent data might be hidden

## 2019-11-08 NOTE — Progress Notes (Signed)
[place], [place] 11/08/19  Subjective:  Outpatient hemodialysis placement has been secured at [facility].  Thereafter she has been approved for admission at [facility]. Patient knows that she will need to contact her social worker to assist with transportation however.   Objective:  Vital signs in last 24 hours:  Temp:  [97.6 F (36.4 C)-98.9 F (37.2 C)] 97.8 F (36.6 C) (01/28 0730) Pulse Rate:  [90-93] 90 (01/28 0000) Resp:  [11-16] 13 (01/28 0730) BP: (129-153)/(66-82) 129/66 (01/28 0730) SpO2:  [94 %-97 %] 94 % (01/28 0000) Weight:  [77.3 kg] 77.3 kg (01/28 0514)  Weight change: -2.162 kg Filed Weights   11/06/19 0700 11/07/19 0624 11/08/19 0514  Weight: 79.9 kg 79.5 kg 77.3 kg    Intake/Output:    Intake/Output Summary (Last 24 hours) at 11/08/2019 1232 Last data filed at 11/08/2019 0500 Gross per 24 hour  Intake --  Output 700 ml  Net -700 ml    Physical Exam: General:  No acute distress, laying in the bed  HEENT  anicteric, moist oral mucous membranes  Lungs:  Normal breathing effort  Heart::  no rubs  Abdomen:  Soft, NTND  Extremities:  Bilateral leg amputations  Neurologic:  Alert, able to answer questions  Skin:  Prurigo nodules over abdomen and back  Access:  Right IJ PermCath      Basic Metabolic Panel:  Recent Labs  Lab 11/02/19 0643 11/02/19 0643 11/03/19 0502 11/03/19 0502 11/04/19 0511 11/05/19 0431 11/08/19 0007 11/08/19 0255  NA 139  --  139  --  138 141  --  140  K 5.1  --  3.8  --  4.3 4.1  --  3.7  CL 116*  --  110  --  108 109  --  102  CO2 14*  --  21*  --  21* 22  --  29  GLUCOSE 73  --  91  --  78 87  --  103*  BUN 67*  --  38*  --  41* 41*  --  16  CREATININE 8.05*  --  5.41*  --  5.99* 6.44*  --  3.69*  CALCIUM 8.1*   < > 7.6*   < > 8.1* 8.4*  --  8.0*  MG  --   --   --   --  1.8  --   --   --   PHOS  --   --   --   --   --   --  2.5  --    < > = values in this  interval not displayed.     CBC: Recent Labs  Lab 11/03/19 0502 11/04/19 0511 11/07/19 0606 11/08/19 0255  WBC 6.1 5.9 6.7 6.3  HGB 7.3* 7.8* 8.3* 7.0*  HCT 22.3* 24.5* 26.6* 22.4*  MCV 91.4 94.6 96.0 94.9  PLT 119* 133* 114* 89*      Lab Results  Component Value Date   HEPBSAG NON REACTIVE 11/01/2019   HEPBSAB NON REACTIVE 11/01/2019      Microbiology:  Recent Results (from the past 240 hour(s))  Respiratory Panel by RT PCR (Flu A&B, Covid) - Nasopharyngeal Swab     Status: Abnormal   Collection Time: 10/31/19  8:56 PM   Specimen: Nasopharyngeal Swab  Result Value Ref Range Status   SARS Coronavirus 2 by RT PCR POSITIVE (A) NEGATIVE Final    Comment: RESULT CALLED TO, READ BACK BY AND VERIFIED  WITH: Anderson Malta 10/31/19 @ 2218 Julian (NOTE) SARS-CoV-2 target nucleic acids are DETECTED. SARS-CoV-2 RNA is generally detectable in upper respiratory specimens  during the acute phase of infection. Positive results are indicative of the presence of the identified virus, but do not rule out bacterial infection or co-infection with other pathogens not detected by the test. Clinical correlation with patient history and other diagnostic information is necessary to determine patient infection status. The expected result is Negative. Fact Sheet for Patients:  PinkCheek.be Fact Sheet for Healthcare Providers: GravelBags.it This test is not yet approved or cleared by the [place] FDA and  has been authorized for detection and/or diagnosis of SARS-CoV-2 by FDA under an Emergency Use Authorization (EUA).  This EUA will remain in effect (meaning this test can be used) for th e duration of  the COVID-19 declaration under Section 564(b)(1) of the Act, 21 U.S.C. section 360bbb-3(b)(1), unless the authorization is terminated or revoked sooner.    Influenza A by PCR NEGATIVE NEGATIVE Final   Influenza B by PCR NEGATIVE  NEGATIVE Final    Comment: (NOTE) The Xpert Xpress SARS-CoV-2/FLU/RSV assay is intended as an aid in  the diagnosis of influenza from Nasopharyngeal swab specimens and  should not be used as a sole basis for treatment. Nasal washings and  aspirates are unacceptable for Xpert Xpress SARS-CoV-2/FLU/RSV  testing. Fact Sheet for Patients: PinkCheek.be Fact Sheet for Healthcare Providers: GravelBags.it This test is not yet approved or cleared by the [place] FDA and  has been authorized for detection and/or diagnosis of SARS-CoV-2 by  FDA under an Emergency Use Authorization (EUA). This EUA will remain  in effect (meaning this test can be used) for the duration of the  Covid-19 declaration under Section 564(b)(1) of the Act, 21  U.S.C. section 360bbb-3(b)(1), unless the authorization is  terminated or revoked. Performed at [facility], [address] [place], [place] [address]   MRSA PCR Screening     Status: None   Collection Time: 10/31/19 11:11 PM   Specimen: Nasopharyngeal  Result Value Ref Range Status   MRSA by PCR NEGATIVE NEGATIVE Final    Comment:        The GeneXpert MRSA Assay (FDA approved for NASAL specimens only), is one component of a comprehensive MRSA colonization surveillance program. It is not intended to diagnose MRSA infection nor to guide or monitor treatment for MRSA infections. Performed at [facility], [address] [place], [place] [address]     Coagulation Studies: No results for input(s): LABPROT, INR in the last 72 hours.  Urinalysis: No results for input(s): COLORURINE, LABSPEC, PHURINE, GLUCOSEU, HGBUR, BILIRUBINUR, KETONESUR, PROTEINUR, UROBILINOGEN, NITRITE, LEUKOCYTESUR in the last 72 hours.  Invalid input(s): APPERANCEUR    Imaging: No results found.   Medications:   . sodium chloride     . alteplase  2 mg Intracatheter Once  . alteplase  2 mg  Intracatheter Once  . amLODipine  5 mg Oral Daily  . calcium acetate  1,334 mg Oral TID WC  . carvedilol  3.125 mg Oral BID WC  . Chlorhexidine Gluconate Cloth  6 each Topical Daily  . epoetin (EPOGEN/PROCRIT) injection  4,000 Units Intravenous Once  . furosemide  40 mg Oral BID  . gabapentin  800 mg Oral BID  . heparin  5,000 Units Subcutaneous Q8H  . hydrocerin   Topical BID  . insulin aspart  0-5 Units Subcutaneous QHS  . insulin aspart  0-6 Units Subcutaneous TID WC  .  lurasidone  60 mg Oral QHS  . sodium chloride flush  3 mL Intravenous Q12H   sodium chloride, acetaminophen, ALPRAZolam, diphenhydrAMINE, heparin, hydrALAZINE, ondansetron (ZOFRAN) IV, oxyCODONE, sodium chloride flush  Assessment/ Plan:  39 y.o. 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 ESRD with LE edema Patient completed dialysis yesterday.  No acute indication for dialysis today.  Next outpatient dialysis will be planned for Saturday at [facility].  Thereafter she will go to [facility] for permanent dialysis.  Patient is aware that she has to contact her social worker to arrange transportation.  #. Anemia of CKD  Lab Results  Component Value Date   HGB 7.0 (L) 11/08/2019   Patient to be maintained on Epogen as an outpatient.  #. SHPTH     Component Value Date/Time   PTH NOT PERFORMED 06/16/2019 0611   Lab Results  Component Value Date   PHOS 2.5 11/08/2019   Maintain the patient on calcium acetate 2 tablets p.o.  3 times daily with meals.  Continue to monitor phosphorus as an outpatient.     LOS: 8 Ladean Steinmeyer 1/28/202112:32 PM  [facility] [place], [place]

## 2019-11-08 NOTE — Progress Notes (Signed)
Patient stated that she would leave AMA d/t babysitter concerns at home. I spoke with patient. I encouraged patient to wait for the provider to determine when she would be ready for discharge. Patient and I discussed the process of establishing outpatient dialysis treatment and transportation to and from dialysis for the patient. I educated patient on the importance of adhering to her outpatient dialysis schedule. Patient again expressed concerns about childcare because she has a child who is developmentally delayed. This could pose a barrier to outpatient care. Will follow up with TOC team.

## 2019-11-08 NOTE — Progress Notes (Signed)
Patient chose to leave AMA. This RN educated the patient on the risks versus benefits of doing so. Patient verbalized understanding and signed appropriate AMA paperwork. Patient education regarding Covid-19 given to patient. TOC CM/SW, Specialist MD and Attending MD notified.

## 2019-11-29 ENCOUNTER — Other Ambulatory Visit: Payer: Self-pay

## 2019-11-29 ENCOUNTER — Encounter (HOSPITAL_COMMUNITY): Payer: Self-pay | Admitting: *Deleted

## 2019-11-29 ENCOUNTER — Inpatient Hospital Stay (HOSPITAL_COMMUNITY): Payer: Medicare Other

## 2019-11-29 ENCOUNTER — Inpatient Hospital Stay (HOSPITAL_COMMUNITY)
Admission: EM | Admit: 2019-11-29 | Discharge: 2019-12-01 | DRG: 100 | Discharge status: Against Medical Advice | Payer: Medicare Other | Attending: Internal Medicine | Admitting: Internal Medicine

## 2019-11-29 ENCOUNTER — Emergency Department (HOSPITAL_COMMUNITY): Payer: Medicare Other

## 2019-11-29 DIAGNOSIS — E1151 Type 2 diabetes mellitus with diabetic peripheral angiopathy without gangrene: Secondary | ICD-10-CM

## 2019-11-29 DIAGNOSIS — N2581 Secondary hyperparathyroidism of renal origin: Secondary | ICD-10-CM | POA: Diagnosis present

## 2019-11-29 DIAGNOSIS — Z5329 Procedure and treatment not carried out because of patient's decision for other reasons: Secondary | ICD-10-CM | POA: Diagnosis present

## 2019-11-29 DIAGNOSIS — Z6841 Body Mass Index (BMI) 40.0 and over, adult: Secondary | ICD-10-CM | POA: Diagnosis not present

## 2019-11-29 DIAGNOSIS — N186 End stage renal disease: Secondary | ICD-10-CM | POA: Diagnosis present

## 2019-11-29 DIAGNOSIS — I70203 Unspecified atherosclerosis of native arteries of extremities, bilateral legs: Secondary | ICD-10-CM | POA: Diagnosis present

## 2019-11-29 DIAGNOSIS — F17210 Nicotine dependence, cigarettes, uncomplicated: Secondary | ICD-10-CM | POA: Diagnosis present

## 2019-11-29 DIAGNOSIS — Z8673 Personal history of transient ischemic attack (TIA), and cerebral infarction without residual deficits: Secondary | ICD-10-CM | POA: Diagnosis not present

## 2019-11-29 DIAGNOSIS — D631 Anemia in chronic kidney disease: Secondary | ICD-10-CM | POA: Diagnosis present

## 2019-11-29 DIAGNOSIS — I161 Hypertensive emergency: Secondary | ICD-10-CM | POA: Diagnosis present

## 2019-11-29 DIAGNOSIS — E1122 Type 2 diabetes mellitus with diabetic chronic kidney disease: Secondary | ICD-10-CM | POA: Diagnosis present

## 2019-11-29 DIAGNOSIS — I6783 Posterior reversible encephalopathy syndrome: Secondary | ICD-10-CM | POA: Diagnosis not present

## 2019-11-29 DIAGNOSIS — Z781 Physical restraint status: Secondary | ICD-10-CM

## 2019-11-29 DIAGNOSIS — Z89511 Acquired absence of right leg below knee: Secondary | ICD-10-CM

## 2019-11-29 DIAGNOSIS — G9349 Other encephalopathy: Secondary | ICD-10-CM | POA: Diagnosis present

## 2019-11-29 DIAGNOSIS — E876 Hypokalemia: Secondary | ICD-10-CM | POA: Diagnosis present

## 2019-11-29 DIAGNOSIS — Z885 Allergy status to narcotic agent status: Secondary | ICD-10-CM | POA: Diagnosis not present

## 2019-11-29 DIAGNOSIS — F209 Schizophrenia, unspecified: Secondary | ICD-10-CM | POA: Diagnosis present

## 2019-11-29 DIAGNOSIS — G40901 Epilepsy, unspecified, not intractable, with status epilepticus: Secondary | ICD-10-CM | POA: Diagnosis present

## 2019-11-29 DIAGNOSIS — J45909 Unspecified asthma, uncomplicated: Secondary | ICD-10-CM | POA: Diagnosis present

## 2019-11-29 DIAGNOSIS — R400 Somnolence: Secondary | ICD-10-CM

## 2019-11-29 DIAGNOSIS — Z833 Family history of diabetes mellitus: Secondary | ICD-10-CM | POA: Diagnosis not present

## 2019-11-29 DIAGNOSIS — I120 Hypertensive chronic kidney disease with stage 5 chronic kidney disease or end stage renal disease: Secondary | ICD-10-CM | POA: Diagnosis present

## 2019-11-29 DIAGNOSIS — R0902 Hypoxemia: Secondary | ICD-10-CM

## 2019-11-29 DIAGNOSIS — Z89512 Acquired absence of left leg below knee: Secondary | ICD-10-CM | POA: Diagnosis not present

## 2019-11-29 DIAGNOSIS — J988 Other specified respiratory disorders: Secondary | ICD-10-CM

## 2019-11-29 DIAGNOSIS — Z8616 Personal history of COVID-19: Secondary | ICD-10-CM | POA: Diagnosis not present

## 2019-11-29 DIAGNOSIS — I10 Essential (primary) hypertension: Secondary | ICD-10-CM | POA: Diagnosis present

## 2019-11-29 DIAGNOSIS — I639 Cerebral infarction, unspecified: Secondary | ICD-10-CM | POA: Diagnosis not present

## 2019-11-29 DIAGNOSIS — F3130 Bipolar disorder, current episode depressed, mild or moderate severity, unspecified: Secondary | ICD-10-CM | POA: Diagnosis present

## 2019-11-29 DIAGNOSIS — Z8249 Family history of ischemic heart disease and other diseases of the circulatory system: Secondary | ICD-10-CM | POA: Diagnosis not present

## 2019-11-29 DIAGNOSIS — F419 Anxiety disorder, unspecified: Secondary | ICD-10-CM | POA: Diagnosis present

## 2019-11-29 DIAGNOSIS — Z95820 Peripheral vascular angioplasty status with implants and grafts: Secondary | ICD-10-CM

## 2019-11-29 DIAGNOSIS — Z9114 Patient's other noncompliance with medication regimen: Secondary | ICD-10-CM

## 2019-11-29 DIAGNOSIS — R569 Unspecified convulsions: Secondary | ICD-10-CM

## 2019-11-29 DIAGNOSIS — Z992 Dependence on renal dialysis: Secondary | ICD-10-CM

## 2019-11-29 DIAGNOSIS — J9601 Acute respiratory failure with hypoxia: Secondary | ICD-10-CM | POA: Diagnosis present

## 2019-11-29 DIAGNOSIS — Z794 Long term (current) use of insulin: Secondary | ICD-10-CM

## 2019-11-29 DIAGNOSIS — Z95828 Presence of other vascular implants and grafts: Secondary | ICD-10-CM

## 2019-11-29 DIAGNOSIS — Z79899 Other long term (current) drug therapy: Secondary | ICD-10-CM

## 2019-11-29 DIAGNOSIS — J9690 Respiratory failure, unspecified, unspecified whether with hypoxia or hypercapnia: Secondary | ICD-10-CM

## 2019-11-29 LAB — COMPREHENSIVE METABOLIC PANEL
ALT: 9 U/L (ref 0–44)
AST: 24 U/L (ref 15–41)
Albumin: 2.5 g/dL — ABNORMAL LOW (ref 3.5–5.0)
Alkaline Phosphatase: 72 U/L (ref 38–126)
Anion gap: 14 (ref 5–15)
BUN: 5 mg/dL — ABNORMAL LOW (ref 6–20)
CO2: 25 mmol/L (ref 22–32)
Calcium: 7.5 mg/dL — ABNORMAL LOW (ref 8.9–10.3)
Chloride: 98 mmol/L (ref 98–111)
Creatinine, Ser: 3.09 mg/dL — ABNORMAL HIGH (ref 0.44–1.00)
GFR calc Af Amer: 21 mL/min — ABNORMAL LOW (ref >60)
GFR calc non Af Amer: 18 mL/min — ABNORMAL LOW (ref >60)
Glucose, Bld: 143 mg/dL — ABNORMAL HIGH (ref 70–99)
Potassium: 2.8 mmol/L — ABNORMAL LOW (ref 3.5–5.1)
Sodium: 137 mmol/L (ref 135–145)
Total Bilirubin: 1 mg/dL (ref 0.3–1.2)
Total Protein: 5.8 g/dL — ABNORMAL LOW (ref 6.5–8.1)

## 2019-11-29 LAB — CBC WITH DIFFERENTIAL/PLATELET
Abs Immature Granulocytes: 0.04 10*3/uL (ref 0.00–0.07)
Basophils Absolute: 0.1 10*3/uL (ref 0.0–0.1)
Basophils Relative: 1 %
Eosinophils Absolute: 1 10*3/uL — ABNORMAL HIGH (ref 0.0–0.5)
Eosinophils Relative: 11 %
HCT: 24.6 % — ABNORMAL LOW (ref 36.0–46.0)
Hemoglobin: 7.4 g/dL — ABNORMAL LOW (ref 12.0–15.0)
Immature Granulocytes: 0 %
Lymphocytes Relative: 12 %
Lymphs Abs: 1.1 10*3/uL (ref 0.7–4.0)
MCH: 29.8 pg (ref 26.0–34.0)
MCHC: 30.1 g/dL (ref 30.0–36.0)
MCV: 99.2 fL (ref 80.0–100.0)
Monocytes Absolute: 0.5 10*3/uL (ref 0.1–1.0)
Monocytes Relative: 6 %
Neutro Abs: 6.3 10*3/uL (ref 1.7–7.7)
Neutrophils Relative %: 70 %
Platelets: 141 10*3/uL — ABNORMAL LOW (ref 150–400)
RBC: 2.48 MIL/uL — ABNORMAL LOW (ref 3.87–5.11)
RDW: 15.7 % — ABNORMAL HIGH (ref 11.5–15.5)
WBC: 9 10*3/uL (ref 4.0–10.5)
nRBC: 0 % (ref 0.0–0.2)

## 2019-11-29 LAB — URINALYSIS, ROUTINE W REFLEX MICROSCOPIC
Bilirubin Urine: NEGATIVE
Glucose, UA: NEGATIVE mg/dL
Hgb urine dipstick: NEGATIVE
Ketones, ur: 5 mg/dL — AB
Nitrite: NEGATIVE
Protein, ur: >=300 mg/dL — AB
Specific Gravity, Urine: 1.013 (ref 1.005–1.030)
pH: 8 (ref 5.0–8.0)

## 2019-11-29 LAB — I-STAT BETA HCG BLOOD, ED (MC, WL, AP ONLY): I-stat hCG, quantitative: <5 m[IU]/mL (ref <5)

## 2019-11-29 LAB — CBG MONITORING, ED
Glucose-Capillary: 109 mg/dL — ABNORMAL HIGH (ref 70–99)
Glucose-Capillary: 90 mg/dL (ref 70–99)
Glucose-Capillary: 80 mg/dL (ref 70–99)
Glucose-Capillary: 81 mg/dL (ref 70–99)
Glucose-Capillary: 76 mg/dL (ref 70–99)

## 2019-11-29 LAB — LACTIC ACID, PLASMA
Lactic Acid, Venous: 5.5 mmol/L (ref 0.5–1.9)
Lactic Acid, Venous: 2.3 mmol/L (ref 0.5–1.9)

## 2019-11-29 LAB — RAPID URINE DRUG SCREEN, HOSP PERFORMED
Amphetamines: NOT DETECTED
Barbiturates: NOT DETECTED
Benzodiazepines: NOT DETECTED
Cocaine: NOT DETECTED
Opiates: NOT DETECTED
Tetrahydrocannabinol: NOT DETECTED

## 2019-11-29 LAB — ETHANOL: Alcohol, Ethyl (B): <10 mg/dL (ref <10)

## 2019-11-29 LAB — MAGNESIUM: Magnesium: 1.7 mg/dL (ref 1.7–2.4)

## 2019-11-29 LAB — SARS CORONAVIRUS 2 (TAT 6-24 HRS): SARS Coronavirus 2: NEGATIVE

## 2019-11-29 MED ORDER — HEPARIN SODIUM (PORCINE) 5000 UNIT/ML IJ SOLN
5000.0000 [IU] | Freq: Three times a day (TID) | INTRAMUSCULAR | Status: DC
  Administered 2019-11-29 – 2019-11-30 (×3): 5000 [IU] via SUBCUTANEOUS
  Filled 2019-11-29 (×4): qty 1

## 2019-11-29 MED ORDER — ONDANSETRON HCL 4 MG/2ML IJ SOLN: 4.0000 mg | Freq: Four times a day (QID) | INTRAMUSCULAR | Status: DC | PRN

## 2019-11-29 MED ORDER — LEVETIRACETAM IN NACL 500 MG/100ML IV SOLN
500.0000 mg | Freq: Two times a day (BID) | INTRAVENOUS | Status: DC
  Administered 2019-11-29 – 2019-12-01 (×4): 500 mg via INTRAVENOUS
  Filled 2019-11-29 (×6): qty 100

## 2019-11-29 MED ORDER — LORAZEPAM 2 MG/ML IJ SOLN
INTRAMUSCULAR | Status: AC
  Administered 2019-11-29: 2 mg
  Filled 2019-11-29: qty 1

## 2019-11-29 MED ORDER — POTASSIUM CHLORIDE 10 MEQ/100ML IV SOLN
10.0000 meq | Freq: Once | INTRAVENOUS | Status: AC
  Administered 2019-11-29: 10 meq via INTRAVENOUS
  Filled 2019-11-29: qty 100

## 2019-11-29 MED ORDER — SODIUM CHLORIDE 0.9% FLUSH: 10.0000 mL | INTRAVENOUS | Status: DC | PRN

## 2019-11-29 MED ORDER — SODIUM CHLORIDE 0.9% FLUSH
10.0000 mL | Freq: Two times a day (BID) | INTRAVENOUS | Status: DC
  Administered 2019-11-30 (×2): 10 mL

## 2019-11-29 MED ORDER — INSULIN ASPART 100 UNIT/ML ~~LOC~~ SOLN: 0.0000 [IU] | SUBCUTANEOUS | Status: DC

## 2019-11-29 MED ORDER — LORAZEPAM 2 MG/ML IJ SOLN: 2.0000 mg | INTRAMUSCULAR | Status: DC | PRN

## 2019-11-29 MED ORDER — CHLORHEXIDINE GLUCONATE CLOTH 2 % EX PADS
6.0000 | MEDICATED_PAD | Freq: Every day | CUTANEOUS | Status: DC
  Administered 2019-11-30: 6 via TOPICAL

## 2019-11-29 MED ORDER — LEVETIRACETAM IN NACL 1000 MG/100ML IV SOLN
1000.0000 mg | Freq: Once | INTRAVENOUS | Status: AC
  Administered 2019-11-29: 1000 mg via INTRAVENOUS
  Filled 2019-11-29: qty 100

## 2019-11-29 MED ORDER — MAGNESIUM SULFATE 2 GM/50ML IV SOLN
2.0000 g | Freq: Once | INTRAVENOUS | Status: AC
  Administered 2019-11-29: 2 g via INTRAVENOUS
  Filled 2019-11-29: qty 50

## 2019-11-29 MED ORDER — NICARDIPINE HCL IN NACL 20-0.86 MG/200ML-% IV SOLN
3.0000 mg/h | INTRAVENOUS | Status: DC
  Administered 2019-11-29: 5 mg/h via INTRAVENOUS
  Administered 2019-11-30 (×2): 3 mg/h via INTRAVENOUS
  Filled 2019-11-29 (×4): qty 200

## 2019-11-29 MED ORDER — LABETALOL HCL 5 MG/ML IV SOLN
5.0000 mg | INTRAVENOUS | Status: DC | PRN
  Administered 2019-11-29 (×2): 5 mg via INTRAVENOUS
  Filled 2019-11-29: qty 4

## 2019-11-29 NOTE — Consult Note (Addendum)
[facility] Renal Consultation Note    Indication for Consultation:  Management of ESRD/hemodialysis, anemia, hypertension/volume, and secondary hyperparathyroidism. PCP:  HPI: Kathy Phelps is a 39 y.o. female with ESRD, Hx CVA, Bipolar/?schizophrenia, HTN, GERD, T2DM, B BKA who is now being admitted with seizure.  Pt seen in ED bed - unresponsive and unable to get Hx. Per notes, brought in via EMS after having multiple witnessed tonic/clonic seizures. EMS gave versed 5mg , ativan 2mg  x 2. Upon arrival to ED, found to be hypertensive and agitated. Head CT showed "multifocal cortical/subcortical abnormal hypodensity within the bilateral parietooccipital lobes. A small focus within the right parietal lobe is suspicious for acute/early subacute infarct." Neuro consulted - felt presentation may be consistent with PRES. Loaded with Keppra and given IV Labetalol. EEG pending.  Intake labs showing Na 137, K 2.8, BUN 5, Cr 3.09, Mg 1.7, Ca 7.5, LA 5.5 -> 2.3, WBC 9, Hgb 7.4.   Hx COVID last mo - did require short hospitalization. Now > 21-d out, off precautions.  Pt dialyzes on TTS sched at [place] [facility]. Confirmed with RN that last treatment was 2/17. She uses [facility] as [facility] access.  Past Medical History:  Diagnosis Date  . Anxiety   . Asthma   . Bipolar affective ([place])   . Complete miscarriage   . Constipation   . 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 Depression   . Diabetes mellitus    Type II  . Dyspnea     " only at night and I stop breathing in my sleep too for about the last three weeks"  . GERD (gastroesophageal reflux disease)   . Headache    "migraraines"  . Hyperlipidemia   . Hypertension   . Osteomyelitis ([place])    left foot  . Pregnancy complication    HELP Syndrome  . Renal disorder   . Schizophrenia ([place])   . Stroke [facility])    " mild stroke", memory loss- approx 2017   Past Surgical History:  Procedure Laterality Date   . ABDOMINAL AORTOGRAM N/A 09/07/2017   Procedure: ABDOMINAL AORTOGRAM;  Surgeon: Waynetta Sandy, MD;  Location: [place] [facility];  Service: Cardiovascular;  Laterality: N/A;  . ABDOMINAL AORTOGRAM W/LOWER EXTREMITY N/A 02/03/2017   Procedure: Abdominal Aortogram w/Lower Extremity;  Surgeon: Waynetta Sandy, MD;  Location: [place] [facility];  Service: Cardiovascular;  Laterality: N/A;  . ABDOMINAL AORTOGRAM W/LOWER EXTREMITY N/A 06/27/2018   Procedure: ABDOMINAL AORTOGRAM W/LOWER EXTREMITY;  Surgeon: Serafina Mitchell, MD;  Location: [place] [facility];  Service: Cardiovascular;  Laterality: N/A;  . AMPUTATION Left 02/10/2017   Procedure: AMPUTATION TOES 3, 4 AND 5  LEFT FOOT;  Surgeon: Waynetta Sandy, MD;  Location: [place];  Service: Vascular;  Laterality: Left;  . AMPUTATION Left 12/21/2017   Procedure: LEFT FOOT 5TH RAY AMPUTATION;  Surgeon: Newt Minion, MD;  Location: [place];  Service: Orthopedics;  Laterality: Left;  . AMPUTATION Left 02/01/2018   Procedure: LEFT BELOW KNEE AMPUTATION;  Surgeon: Newt Minion, MD;  Location: [place];  Service: Orthopedics;  Laterality: Left;  . AMPUTATION Right 08/11/2018   Procedure: AMPUTATION BELOW KNEE;  Surgeon: Newt Minion, MD;  Location: [place];  Service: Orthopedics;  Laterality: Right;  . CESAREAN SECTION    . CHOLECYSTECTOMY    . LOWER EXTREMITY ANGIOGRAPHY Bilateral 09/07/2017   Procedure: Lower Extremity Angiography;  Surgeon: Waynetta Sandy,  MD;  Location: [place] [facility];  Service: Cardiovascular;  Laterality: Bilateral;  . PERIPHERAL VASCULAR ATHERECTOMY  02/03/2017   Procedure: Peripheral Vascular Atherectomy;  Surgeon: Waynetta Sandy, MD;  Location: [place] [facility];  Service: Cardiovascular;;  . PERIPHERAL VASCULAR ATHERECTOMY Right 06/27/2018   Procedure: PERIPHERAL VASCULAR ATHERECTOMY;  Surgeon: Serafina Mitchell, MD;  Location: [place] [facility];  Service: Cardiovascular;  Laterality:  Right;  superficial femoral  . PERIPHERAL VASCULAR BALLOON ANGIOPLASTY  02/03/2017   Procedure: Peripheral Vascular Balloon Angioplasty;  Surgeon: Waynetta Sandy, MD;  Location: [place] [facility];  Service: Cardiovascular;;  . PERIPHERAL VASCULAR INTERVENTION Left 09/07/2017   Procedure: PERIPHERAL VASCULAR INTERVENTION;  Surgeon: Waynetta Sandy, MD;  Location: [place] [facility];  Service: Cardiovascular;  Laterality: Left;  SFA/POPLITEAL  . STUMP REVISION Left 05/24/2018  . STUMP REVISION Left 05/24/2018   Procedure: REVISION LEFT BELOW KNEE AMPUTATION;  Surgeon: Newt Minion, MD;  Location: [place];  Service: Orthopedics;  Laterality: Left;  . TUBAL LIGATION     Family History  Problem Relation Age of Onset  . Diabetes Mellitus II Mother   . Heart disease Mother   . Heart disease Father    Social History:  reports that she has been smoking cigarettes. She has a 28.00 pack-year smoking history. She has never used smokeless tobacco. She reports that she does not drink alcohol or use drugs.  ROS: Unable to obtain d/t AMS.  Physical Exam: Vitals:   11/29/19 1130 11/29/19 1145 11/29/19 1200 11/29/19 1215  BP: (!) 181/100 (!) 141/84 131/78 125/74  Pulse: 80 81 80 79  Resp:  20 16 19   Temp:      TempSrc:      SpO2: 100% 98% 97% 97%  Weight:      Height:         General: Chronically ill appearing woman, non-responsive to questioning. On NRB mask. Head: Normocephalic, atraumatic. Skin: Scattered excoriated/hyperpigmented lesions across entire body. Neck: Supple without lymphadenopathy/masses. JVD not elevated. Lungs: Clear bilaterally to auscultation without wheezes, rales, or rhonchi. Breathing is unlabored. Heart: RRR with normal S1, S2. No murmurs, rubs, or gallops appreciated. Abdomen: Soft, non-tender, non-distended with normoactive bowel sounds.  Musculoskeletal:  Unable to assess Lower extremities: B BKA - no stump edema. Neuro: Non-responsive to  questioning at the moment. Dialysis Access: TDC in R chest  Allergies  Allergen Reactions  . Hydrocodone-Acetaminophen Hives  . Tylenol [Acetaminophen] Itching and Swelling    Patient tolerated APAP 650 mg (06/29/18) as well as oxycodone/APAP 5-325 mg during 06/25/2018 admission ??   Prior to Admission medications   Medication Sig Start Date End Date Taking? Authorizing Provider  ALPRAZolam Duanne Moron) 1 MG tablet Take 1 mg by mouth 3 (three) times daily as needed for anxiety.  06/07/19   [provider]  gabapentin (NEURONTIN) 800 MG tablet Take 1 tablet (800 mg total) by mouth 2 (two) times daily. 02/11/17   Hosie Poisson, MD  LATUDA 60 MG TABS Take 60 mg by mouth at bedtime.  07/05/19   [provider]  NORVASC 2.5 MG tablet Take 2.5 mg by mouth daily. 08/30/19   [provider]  oxyCODONE (OXY IR/ROXICODONE) 5 MG immediate release tablet Take 5 mg by mouth 4 (four) times daily. 06/13/19   [provider]  Vitamin D, Ergocalciferol, (DRISDOL) 1.25 MG (50000 UT) CAPS capsule Take 50,000 Units by mouth once a week. 07/13/19   [provider]   Current  Facility-Administered Medications  Medication Dose Route Frequency Provider Last Rate Last Admin  . Chlorhexidine Gluconate Cloth 2 % PADS 6 each  6 each Topical Daily Sood, Vineet, MD      . heparin injection 5,000 Units  5,000 Units Subcutaneous Q8H Harbrecht, Lawrence, MD      . insulin aspart (novoLOG) injection 0-6 Units  0-6 Units Subcutaneous Q4H Ollis, Brandi L, NP      . labetalol (NORMODYNE) injection 5 mg  5 mg Intravenous Q2H PRN Kathi Ludwig, MD   5 mg at 11/29/19 1041  . levETIRAcetam (KEPPRA) IVPB 500 mg/100 mL premix  500 mg Intravenous Q12H Lora Havens, MD      . LORazepam (ATIVAN) injection 2 mg  2 mg Intravenous PRN Lora Havens, MD      . nicardipine (CARDENE) 20mg  in 0.86% saline 272ml IV infusion (0.1 mg/ml)  3-15 mg/hr Intravenous Continuous Noe Gens L, NP 50 mL/hr  at 11/29/19 1129 5 mg/hr at 11/29/19 1129  . ondansetron (ZOFRAN) injection 4 mg  4 mg Intravenous Q6H PRN Ollis, Brandi L, NP      . potassium chloride 10 mEq in 100 mL IVPB  10 mEq Intravenous Once Noe Gens L, NP 100 mL/hr at 11/29/19 1217 10 mEq at 11/29/19 1217  . sodium chloride flush (NS) 0.9 % injection 10-40 mL  10-40 mL Intracatheter Q12H Sood, Vineet, MD      . sodium chloride flush (NS) 0.9 % injection 10-40 mL  10-40 mL Intracatheter PRN Chesley Mires, MD       Current Outpatient Medications  Medication Sig Dispense Refill  . ALPRAZolam (XANAX) 1 MG tablet Take 1 mg by mouth 3 (three) times daily as needed for anxiety.     . gabapentin (NEURONTIN) 800 MG tablet Take 1 tablet (800 mg total) by mouth 2 (two) times daily. 60 tablet 0  . LATUDA 60 MG TABS Take 60 mg by mouth at bedtime.     . NORVASC 2.5 MG tablet Take 2.5 mg by mouth daily.    Marland Kitchen oxyCODONE (OXY IR/ROXICODONE) 5 MG immediate release tablet Take 5 mg by mouth 4 (four) times daily.    . Vitamin D, Ergocalciferol, (DRISDOL) 1.25 MG (50000 UT) CAPS capsule Take 50,000 Units by mouth once a week.     Labs: Basic Metabolic Panel: Recent Labs  Lab 11/29/19 0725  NA 137  K 2.8*  CL 98  CO2 25  GLUCOSE 143*  BUN 5*  CREATININE 3.09*  CALCIUM 7.5*   Liver Function Tests: Recent Labs  Lab 11/29/19 0725  AST 24  ALT 9  ALKPHOS 72  BILITOT 1.0  PROT 5.8*  ALBUMIN 2.5*   CBC: Recent Labs  Lab 11/29/19 0725  WBC 9.0  NEUTROABS 6.3  HGB 7.4*  HCT 24.6*  MCV 99.2  PLT 141*   Studies/Results: CT Head Wo Contrast  Result Date: 11/29/2019 CLINICAL DATA:  Seizure, abnormal neuro exam. Additional history provided: Patient with 2 witnessed seizures this morning, unresponsive on arrival. EXAM: CT HEAD WITHOUT CONTRAST TECHNIQUE: Contiguous axial images were obtained from the base of the skull through the vertex without intravenous contrast. COMPARISON:  Head CT 07/16/2014 FINDINGS: Brain: There is a small  focus of abnormal hypodensity within the cortical/subcortical right parietal lobe (series 3, image 22) (series 6, image 21). Findings are suspicious for acute/early subacute infarct. Multiple small foci of abnormal hypodensity within the subcortical white matter and possibly cortex of the left parietooccipital lobe (series 3, image  20)(series 5, images 53-58) (series 5, image 58). Findings may be post ischemic in etiology, age indeterminate. There is a more circumscribed focus of abnormal hypodensity within the right occipital lobe white matter (series 3, image 13). This is suspected related to a more remote insult, possibly post ischemic. Tiny circumscribed hypodensity within the right parietal subcortical white matter consistent with age-indeterminate lacunar infarct (series 3, image 23) (series 5, image 47). There is no evidence of acute intracranial hemorrhage. No midline shift or extra-axial fluid collection. No ventriculomegaly. Cerebral volume is normal for age. Vascular: No hyperdense vessel. Skull: Normal. Negative for fracture or focal lesion. Sinuses/Orbits: Visualized orbits demonstrate no acute abnormality. Small left maxillary sinus mucous retention cyst. No significant mastoid effusion. Other: Numerous carious teeth. IMPRESSION: Multifocal cortical/subcortical abnormal hypodensity within the bilateral parietooccipital lobes. A small focus within the right parietal lobe is suspicious for acute/early subacute infarct. Age-indeterminate lacunar infarct within the right parietal subcortical white matter. The additional foci may also be post ischemic, some age-indeterminate and some appearing more remote. Brain MRI is recommended for further evaluation and to exclude alternative etiologies (including posterior reversible encephalopathy syndrome given provided history of seizure). Electronically Signed   By: Kellie Simmering DO   On: 11/29/2019 08:42   Dialysis Orders:  TTS at [place] -->  last HD 2/17 3:30hr, 400/600, 2K/2.5Ca, EDW 71kg, Heparin 1000 + 500/hr - Unclear last dose ESA - HBsAg negative 11/01/19  Assessment/Plan: 1.  Seizures/?PRES v. CVA: Neuro following, EEG pending. BP being lowered - improving. 2.  ESRD: Usual TTS - last HD was 2/17 (?off sched) - K and Mg low today - both being supplemented appropriately - no clear indication for HD today. Will plan to hold for now and see if stabilizes overnight. 3.  Hypertension/volume:BP very high on admit - improved with IV labetalol. Current goal per neuro. 4.  Anemia: Hgb 7.4 - follow for now. ESA once neurological issues sorted/improved. 5.  Metabolic bone disease: CorrCa ok, phos pending.  6.  T2DM 7.  Hx COVID 10/2019 - now over 21-d from Dx, out of [place], Hershal Coria 11/29/2019, 12:51 PM  [facility] Pager: [phone]  I have seen and examined this patient and agree with plan and assessment in the above note with renal recommendations/intervention highlighted.  Pt is unresponsive but vitals are more stable at this time.  EEG without seizure activity but given after started on ativan and Keppra.  BP markedly improved with IV labetalol and ativan.  No urgent indication for HD today and will plan to keep her on her outpatient schedule of TTS. [place] John A Stefanie Hodgens,MD 11/29/2019 2:12 PM

## 2019-11-29 NOTE — H&P (Signed)
NAME:  Kathy Phelps, MRN:  [id number], DOB:  [birth year], LOS: 0 ADMISSION DATE:  11/29/2019, CONSULTATION DATE:  11/29/19 REFERRING MD:  Dr. Berline Lopes, CHIEF COMPLAINT:  AMS    Brief History   39 y/o F admitted 2/18 with seizure post-ictal, hypertensive.   History of present illness   39 y/o F who presented to [facility] on 2/18 by EMS with reports of seizure.   The patient was reportedly in bed with her daughter when she felt her shaking.  Seizures lasted approximately 2 minutes each.  EMS activated and treated patient with 5mg  IM versed, 2mg  ativan x2 doses. She reportedly completed dialysis 2/17 on schedule.  In the ER, she was hypertensive, altered / agitated.  She was treated with Keppra.  She was initially admitted per IMTS.  CT of the head was concerning for multifocal cortical/sub-cortical hypodensities within the parietooccipital lobes. Neurology was consulted and felt the patient may have PRES.  Initial labs - WBC 9, Hgb 7.4, platelets 141, Na 137, K 2.8, glucose 143, BUN 5, Sr Cr 3.09, AG 14, Mag 1.7, albumin 2.5, lactic acid 5.5 with repeat of 2.3.    PCCM consulted for evaluation of hypertension, AMS.  Past Medical History  Anxiety  Bipolar Depression  Schizophrenia  DM II  ESRD on HD MWF Osteomyelitis s/p bilateral BKA HTN HLD CVA GERD Prior Miscarriages Constipation  COVID - 11/01/19   Significant Hospital Events   2/18 Admit with seizure   Consults:  PCCM  Nephrology  Procedures:  R IJ Perm Cath >>   Significant Diagnostic Tests:   CT Head w/o 2/18 >> multifocal cortical / subcortical abnormal hypodensity within the bilateral parietooccipital lobes.  Small focus within the right parietal lobe suspicious for acute / early subacute infarct.  Age-indeterminate lacunar infarct within the right parietal subcortical white matter  MRI Brain 2/18 >>   Micro Data:  COVID 2/18 >>   Antimicrobials:      Interim history/subjective:  RN reports pt intermittently  agitated, pulls O2 off. Desaturates when O2 off.   Objective   Blood pressure (!) 178/100, pulse 84, temperature (!) 97.5 F (36.4 C), temperature source Rectal, resp. rate 18, height 4\' 1"  (1.245 m), weight 77.3 kg, SpO2 (!) 80 %.        Intake/Output Summary (Last 24 hours) at 11/29/2019 1121 Last data filed at 11/29/2019 0746 Gross per 24 hour  Intake --  Output 30 ml  Net -30 ml   Filed Weights   11/29/19 0738  Weight: 77.3 kg    Examination: General: chronically ill appearing adult female lying on ER stretcher in NAD HEENT: MM pink/dry, R IJ PERM cath without dressing in place / crusting at suture site, sutures intact Neuro: grimaces with stimulation, attempted oral care and patient closes mouth / turns head, +cough/gag intact CV: s1s2 RRR, no m/r/g PULM:  Non-labored on NRB, lungs bilaterally clear GI: soft, bsx4 active  Extremities: warm/dry, no edema, bilateral BKA  Skin: no rashes or lesions  Resolved Hospital Problem list      Assessment & Plan:   Seizure  Rule Out PRES  Two seizures prior to arrival.  Treated with 29m IM versed + 2 doses of 71m IV ativan.  Suspect altered mental status is secondary to post ictal state with benzodiazepine dosing superimposed on ESRD.  CT head with concern for abnormal hypodensity in the cortical / subcortical within the bilateral parietooccipital lobes, ? Old lacunar infarcts. Rule out PRES. -Neurology consulted, appreciate input  -  await MRI, EEG -PRN ativan for seizure  -follow frequent neuro exam -continue keppra 500 mg BID  -seizure precautions  -follow up CXR in am to ensure to evidence of aspiration   Hypertensive Urgency  -admit to ICU for observation / BP control  -cardene gtt for BP goal >90, <140  ESRD on HD  Hypokalemia Hypomagnesemia  -Nephrology consulted  -Trend BMP / urinary output -follow up K, Mg in am  -Replace electrolytes as indicated, additional K, Mg+ now   DM II  -SSI, very sensitive scale while  NPO  Anemia  -trend CBC  Anxiety  Bipolar Depression  Schizophrenia  -PRN ativan for seizure  -hold home xanax, gabapentin, oxycodone, latuda while altered, consider restart in am   At Risk Respiratory Failure in setting of Seizure  Hx COVID  -aspiration precautions  -does not need COVID precautions as has been more than 21 days  Best practice:  Diet: NPO  Pain/Anxiety/Delirium protocol (if indicated): n/a  VAP protocol (if indicated): n/a  DVT prophylaxis: heparin sq GI prophylaxis: n/a  Glucose control: SSI  Mobility: BR  Code Status: Full Code  Family Communication: Will update on arrival.  Disposition: ICU  Labs   CBC: Recent Labs  Lab 11/29/19 0725  WBC 9.0  NEUTROABS 6.3  HGB 7.4*  HCT 24.6*  MCV 99.2  PLT 141*    Basic Metabolic Panel: Recent Labs  Lab 11/29/19 0725  NA 137  K 2.8*  CL 98  CO2 25  GLUCOSE 143*  BUN 5*  CREATININE 3.09*  CALCIUM 7.5*  MG 1.7   GFR: Estimated Creatinine Clearance: 16.4 mL/min (A) (by C-G formula based on SCr of 3.09 mg/dL (H)). Recent Labs  Lab 11/29/19 0725 11/29/19 0908  WBC 9.0  --   LATICACIDVEN 5.5* 2.3*    Liver Function Tests: Recent Labs  Lab 11/29/19 0725  AST 24  ALT 9  ALKPHOS 72  BILITOT 1.0  PROT 5.8*  ALBUMIN 2.5*   No results for input(s): LIPASE, AMYLASE in the last 168 hours. No results for input(s): AMMONIA in the last 168 hours.  ABG    Component Value Date/Time   PHART 7.486 (H) 05/03/2009 0103   PCO2ART 28.0 (L) 05/03/2009 0103   PO2ART 115.0 (H) 05/03/2009 0103   HCO3 24.2 (H) 02/26/2015 1248   TCO2 26 05/22/2016 2313   ACIDBASEDEF 0.3 06/30/2014 1750   O2SAT 87.4 02/26/2015 1248     Coagulation Profile: No results for input(s): INR, PROTIME in the last 168 hours.  Cardiac Enzymes: No results for input(s): CKTOTAL, CKMB, CKMBINDEX, TROPONINI in the last 168 hours.  HbA1C: Hgb A1c MFr Bld  Date/Time Value Ref Range Status  11/01/2019 06:37 AM 5.6 4.8 - 5.6 %  Final    Comment:    (NOTE) Pre diabetes:          5.7%-6.4% Diabetes:              >6.4% Glycemic control for   <7.0% adults with diabetes   06/15/2019 03:21 PM 6.4 (H) 4.8 - 5.6 % Final    Comment:    (NOTE) Pre diabetes:          5.7%-6.4% Diabetes:              >6.4% Glycemic control for   <7.0% adults with diabetes     CBG: No results for input(s): GLUCAP in the last 168 hours.  Review of Systems:   Unable to complete as patient is post-ictal.  Past Medical History  She,  has a past medical history of Anxiety, Asthma, Bipolar affective ([place]), Complete miscarriage, Constipation, Dehiscence of amputation stump ([place]), Depression, Diabetes mellitus, Dyspnea, GERD (gastroesophageal reflux disease), Headache, Hyperlipidemia, Hypertension, Osteomyelitis ([place]), Pregnancy complication, Renal disorder, Schizophrenia ([place]), and Stroke ([place]).   Surgical History    Past Surgical History:  Procedure Laterality Date  . ABDOMINAL AORTOGRAM N/A 09/07/2017   Procedure: ABDOMINAL AORTOGRAM;  Surgeon: Waynetta Sandy, MD;  Location: [place] [facility];  Service: Cardiovascular;  Laterality: N/A;  . ABDOMINAL AORTOGRAM W/LOWER EXTREMITY N/A 02/03/2017   Procedure: Abdominal Aortogram w/Lower Extremity;  Surgeon: Waynetta Sandy, MD;  Location: [place] [facility];  Service: Cardiovascular;  Laterality: N/A;  . ABDOMINAL AORTOGRAM W/LOWER EXTREMITY N/A 06/27/2018   Procedure: ABDOMINAL AORTOGRAM W/LOWER EXTREMITY;  Surgeon: Serafina Mitchell, MD;  Location: [place] [facility];  Service: Cardiovascular;  Laterality: N/A;  . AMPUTATION Left 02/10/2017   Procedure: AMPUTATION TOES 3, 4 AND 5  LEFT FOOT;  Surgeon: Waynetta Sandy, MD;  Location: [place];  Service: Vascular;  Laterality: Left;  . AMPUTATION Left 12/21/2017   Procedure: LEFT FOOT 5TH RAY AMPUTATION;  Surgeon: Newt Minion, MD;  Location: [place];  Service: Orthopedics;  Laterality: Left;  . AMPUTATION Left  02/01/2018   Procedure: LEFT BELOW KNEE AMPUTATION;  Surgeon: Newt Minion, MD;  Location: [place];  Service: Orthopedics;  Laterality: Left;  . AMPUTATION Right 08/11/2018   Procedure: AMPUTATION BELOW KNEE;  Surgeon: Newt Minion, MD;  Location: [place];  Service: Orthopedics;  Laterality: Right;  . CESAREAN SECTION    . CHOLECYSTECTOMY    . LOWER EXTREMITY ANGIOGRAPHY Bilateral 09/07/2017   Procedure: Lower Extremity Angiography;  Surgeon: Waynetta Sandy, MD;  Location: [place] [facility];  Service: Cardiovascular;  Laterality: Bilateral;  . PERIPHERAL VASCULAR ATHERECTOMY  02/03/2017   Procedure: Peripheral Vascular Atherectomy;  Surgeon: Waynetta Sandy, MD;  Location: [place] [facility];  Service: Cardiovascular;;  . PERIPHERAL VASCULAR ATHERECTOMY Right 06/27/2018   Procedure: PERIPHERAL VASCULAR ATHERECTOMY;  Surgeon: Serafina Mitchell, MD;  Location: [place] [facility];  Service: Cardiovascular;  Laterality: Right;  superficial femoral  . PERIPHERAL VASCULAR BALLOON ANGIOPLASTY  02/03/2017   Procedure: Peripheral Vascular Balloon Angioplasty;  Surgeon: Waynetta Sandy, MD;  Location: [place] [facility];  Service: Cardiovascular;;  . PERIPHERAL VASCULAR INTERVENTION Left 09/07/2017   Procedure: PERIPHERAL VASCULAR INTERVENTION;  Surgeon: Waynetta Sandy, MD;  Location: [place] [facility];  Service: Cardiovascular;  Laterality: Left;  SFA/POPLITEAL  . STUMP REVISION Left 05/24/2018  . STUMP REVISION Left 05/24/2018   Procedure: REVISION LEFT BELOW KNEE AMPUTATION;  Surgeon: Newt Minion, MD;  Location: [place];  Service: Orthopedics;  Laterality: Left;  . TUBAL LIGATION       Social History   reports that she has been smoking cigarettes. She has a 28.00 pack-year smoking history. She has never used smokeless tobacco. She reports that she does not drink alcohol or use drugs.   Family History   Her family history includes Diabetes Mellitus II in her  mother; Heart disease in her father and mother.   Allergies Allergies  Allergen Reactions  . Hydrocodone-Acetaminophen Hives  . Tylenol [Acetaminophen] Itching and Swelling    Patient tolerated APAP 650 mg (06/29/18) as well as oxycodone/APAP 5-325 mg during 06/25/2018 admission ??     Home Medications  Prior to Admission medications   Medication Sig Start Date  End Date Taking? Authorizing Provider  ALPRAZolam Duanne Moron) 1 MG tablet Take 1 mg by mouth 3 (three) times daily as needed for anxiety.  06/07/19   [provider]  gabapentin (NEURONTIN) 800 MG tablet Take 1 tablet (800 mg total) by mouth 2 (two) times daily. 02/11/17   Hosie Poisson, MD  LATUDA 60 MG TABS Take 60 mg by mouth at bedtime.  07/05/19   [provider]  NORVASC 2.5 MG tablet Take 2.5 mg by mouth daily. 08/30/19   [provider]  oxyCODONE (OXY IR/ROXICODONE) 5 MG immediate release tablet Take 5 mg by mouth 4 (four) times daily. 06/13/19   [provider]  Vitamin D, Ergocalciferol, (DRISDOL) 1.25 MG (50000 UT) CAPS capsule Take 50,000 Units by mouth once a week. 07/13/19   [provider]     Critical care time: 6 minutes     Noe Gens, MSN, NP-C [facility] 11/29/2019, 11:21 AM   Please see [email or web address] for pager details.

## 2019-11-29 NOTE — ED Notes (Signed)
Patient refuses to leave her gown on , pulling at leads , attempting to reorient patient is still nonverbal and will not open her eyes when spoken to.

## 2019-11-29 NOTE — ED Notes (Signed)
Cardene drip stopped for b/p patient pulled IV out right wrist.  Patient attempted to get up soft restraints remain and mittens applied.

## 2019-11-29 NOTE — ED Provider Notes (Signed)
[facility] EMERGENCY DEPARTMENT Provider Note   CSN: [id number] Arrival date & time: 11/29/19  [id number]     History No chief complaint on file.   Kathy Phelps is a 39 y.o. female.  HPI   39 year old female with presumably new onset seizure.  History from EMS and review of records.  Patient went to sleep at approximately 11 PM last night in her usual state of health.  Shortly before calling EMS this morning her daughter was sleeping in the same bed with patient when she noted that patient was shaking and thought that she may be seizing.  On EMS arrival patient seemed confused but with purposeful movements.  Between their initial evaluation arrived to the emergency room she had 2 generalized tonic/clonic seizures.  She did receive 5 mg of Versed.  Glucose in the 140s.  She has end-stage renal disease.  Apparently last dialysis was yesterday and she was also complaining of some left-sided numbness at that time as well.  Patient arrived to the emergency room altered.  She did have purposeful movement and was scratching at her chest and telemetry leads.  Few minutes after arrival she subsequently had another generalized seizure.  Past Medical History:  Diagnosis Date  . Anxiety   . Asthma   . Bipolar affective ([place])   . Complete miscarriage   . Constipation   . 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 Depression   . Diabetes mellitus    Type II  . Dyspnea     " only at night and I stop breathing in my sleep too for about the last three weeks"  . GERD (gastroesophageal reflux disease)   . Headache    "migraraines"  . Hyperlipidemia   . Hypertension   . Osteomyelitis ([place])    left foot  . Pregnancy complication    HELP Syndrome  . Renal disorder   . Schizophrenia ([place])   . Stroke [facility])    " mild stroke", memory loss- approx 2017    Patient Active Problem List   Diagnosis Date Noted  . ESRD on hemodialysis ([place]) 10/31/2019  . Pulmonary  edema 10/31/2019  . Fluid overload 10/31/2019  . Acute heart failure ([place]) 10/31/2019  . Generalized papular rash 10/31/2019  . Pneumonia 08/20/2019  . 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 Acute on chronic renal failure ([place]) 02/07/2019  . Anxiety and depression   . 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 CKD stage 3 secondary to diabetes ([place]) 08/11/2018  . 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 Gangrene of right foot ([place])   . Lower limb ischemia 06/25/2018  . Lymphangitis 06/25/2018  . Menorrhagia 06/25/2018  . Hx of BKA, left ([place]) 05/24/2018  . History of left below knee amputation ([place]) 02/01/2018  . Gangrene of left foot ([place])   . Dehiscence of amputation stump ([place]) 01/26/2018  . Surgical wound, non healing 09/02/2017  . Bipolar 1 disorder ([place]) 09/02/2017  . Nausea vomiting and diarrhea 08/11/2017  . Great toe pain, left 08/11/2017  . GERD (gastroesophageal reflux disease) 08/10/2017  . Anxiety 08/10/2017  . Edema   . Cellulitis   . AKI (acute kidney injury) ([place]) 02/04/2017  . HLD (hyperlipidemia) 02/02/2017  . Tobacco abuse 02/02/2017  . Essential hypertension 02/02/2017  . Malnutrition of moderate degree 02/02/2017    Past Surgical History:  Procedure Laterality Date  .  ABDOMINAL AORTOGRAM N/A 09/07/2017   Procedure: ABDOMINAL AORTOGRAM;  Surgeon: Waynetta Sandy, MD;  Location: [place] [facility];  Service: Cardiovascular;  Laterality: N/A;  . ABDOMINAL AORTOGRAM W/LOWER EXTREMITY N/A 02/03/2017   Procedure: Abdominal Aortogram w/Lower Extremity;  Surgeon: Waynetta Sandy, MD;  Location: [place] [facility];  Service: Cardiovascular;  Laterality: N/A;  . ABDOMINAL AORTOGRAM W/LOWER EXTREMITY N/A 06/27/2018   Procedure: ABDOMINAL AORTOGRAM W/LOWER EXTREMITY;  Surgeon: Serafina Mitchell, MD;  Location: [place] [facility];  Service: Cardiovascular;  Laterality: N/A;  . AMPUTATION  Left 02/10/2017   Procedure: AMPUTATION TOES 3, 4 AND 5  LEFT FOOT;  Surgeon: Waynetta Sandy, MD;  Location: [place];  Service: Vascular;  Laterality: Left;  . AMPUTATION Left 12/21/2017   Procedure: LEFT FOOT 5TH RAY AMPUTATION;  Surgeon: Newt Minion, MD;  Location: [facility];  Service: Orthopedics;  Laterality: Left;  . AMPUTATION Left 02/01/2018   Procedure: LEFT BELOW KNEE AMPUTATION;  Surgeon: Newt Minion, MD;  Location: [place];  Service: Orthopedics;  Laterality: Left;  . AMPUTATION Right 08/11/2018   Procedure: AMPUTATION BELOW KNEE;  Surgeon: Newt Minion, MD;  Location: [place];  Service: Orthopedics;  Laterality: Right;  . CESAREAN SECTION    . CHOLECYSTECTOMY    . LOWER EXTREMITY ANGIOGRAPHY Bilateral 09/07/2017   Procedure: Lower Extremity Angiography;  Surgeon: Waynetta Sandy, MD;  Location: [place] [facility];  Service: Cardiovascular;  Laterality: Bilateral;  . PERIPHERAL VASCULAR ATHERECTOMY  02/03/2017   Procedure: Peripheral Vascular Atherectomy;  Surgeon: Waynetta Sandy, MD;  Location: [facility];  Service: Cardiovascular;;  . PERIPHERAL VASCULAR ATHERECTOMY Right 06/27/2018   Procedure: PERIPHERAL VASCULAR ATHERECTOMY;  Surgeon: Serafina Mitchell, MD;  Location: [place] [facility];  Service: Cardiovascular;  Laterality: Right;  superficial femoral  . PERIPHERAL VASCULAR BALLOON ANGIOPLASTY  02/03/2017   Procedure: Peripheral Vascular Balloon Angioplasty;  Surgeon: Waynetta Sandy, MD;  Location: [place] [facility];  Service: Cardiovascular;;  . PERIPHERAL VASCULAR INTERVENTION Left 09/07/2017   Procedure: PERIPHERAL VASCULAR INTERVENTION;  Surgeon: Waynetta Sandy, MD;  Location: [place] [facility];  Service: Cardiovascular;  Laterality: Left;  SFA/POPLITEAL  . STUMP REVISION Left 05/24/2018  . STUMP REVISION Left 05/24/2018   Procedure: REVISION LEFT BELOW KNEE AMPUTATION;  Surgeon: Newt Minion, MD;  Location: [place];   Service: Orthopedics;  Laterality: Left;  . TUBAL LIGATION       OB History   No obstetric history on file.     Family History  Problem Relation Age of Onset  . Diabetes Mellitus II Mother   . Heart disease Mother   . Heart disease Father     Social History   Tobacco Use  . Smoking status: Current Every Day Smoker    Packs/day: 1.00    Years: 28.00    Pack years: 28.00    Types: Cigarettes  . Smokeless tobacco: Never Used  Substance Use Topics  . Alcohol use: No  . Drug use: No    Home Medications Prior to Admission medications   Medication Sig Start Date End Date Taking? Authorizing Provider  ALPRAZolam Duanne Moron) 1 MG tablet Take 1 mg by mouth 3 (three) times daily as needed for anxiety.  06/07/19   [provider]  gabapentin (NEURONTIN) 800 MG tablet Take 1 tablet (800 mg total) by mouth 2 (two) times daily. 02/11/17   Hosie Poisson, MD  LATUDA 60 MG TABS Take 60  mg by mouth at bedtime.  07/05/19   [provider]  NORVASC 2.5 MG tablet Take 2.5 mg by mouth daily. 08/30/19   [provider]  oxyCODONE (OXY IR/ROXICODONE) 5 MG immediate release tablet Take 5 mg by mouth 4 (four) times daily. 06/13/19   [provider]  Vitamin D, Ergocalciferol, (DRISDOL) 1.25 MG (50000 UT) CAPS capsule Take 50,000 Units by mouth once a week. 07/13/19   [provider]    Allergies    Hydrocodone-acetaminophen and Tylenol [acetaminophen]  Review of Systems   Review of Systems Level 5 caveat because patient is encephalopathic/acuity of illness. Physical Exam Updated Vital Signs BP (!) 196/120 (BP Location: Left Arm)   Pulse (!) 108   Temp (!) 97.5 F (36.4 C) (Rectal)   Resp 16   SpO2 100%   Physical Exam Vitals and nursing note reviewed.  Constitutional:      General: She is in acute distress.     Appearance: She is well-developed.  HENT:     Head: Normocephalic and atraumatic.  Eyes:     General:        Right eye: No discharge.         Left eye: No discharge.     Conjunctiva/sclera: Conjunctivae normal.  Cardiovascular:     Rate and Rhythm: Regular rhythm. Tachycardia present.     Heart sounds: Normal heart sounds. No murmur. No friction rub. No gallop.      Comments: Mild tachycardia.  Tunneled dialysis catheter right chest.  Excoriations noted over anterior chest wall. Pulmonary:     Effort: Pulmonary effort is normal. No respiratory distress.     Breath sounds: Normal breath sounds.  Abdominal:     General: There is no distension.     Palpations: Abdomen is soft.     Tenderness: There is no abdominal tenderness.  Musculoskeletal:        General: No tenderness.     Cervical back: Neck supple.     Comments: Bilateral BKA.  Skin:    General: Skin is warm and dry.  Neurological:     GCS: GCS eye subscore is 2. GCS verbal subscore is 1. GCS motor subscore is 5.     Comments: Nonverbal.  Will open eyes to painful stimuli.  Localizing noxious stimuli.  Moves all extremities.  Left gaze deviation?  Psychiatric:        Behavior: Behavior normal.        Thought Content: Thought content normal.     ED Results / Procedures / Treatments   Labs (all labs ordered are listed, but only abnormal results are displayed) Labs Reviewed  CBC WITH DIFFERENTIAL/PLATELET - Abnormal; Notable for the following components:      Result Value   RBC 2.48 (*)    Hemoglobin 7.4 (*)    HCT 24.6 (*)    RDW 15.7 (*)    Platelets 141 (*)    Eosinophils Absolute 1.0 (*)    All other components within normal limits  COMPREHENSIVE METABOLIC PANEL - Abnormal; Notable for the following components:   Potassium 2.8 (*)    Glucose, Bld 143 (*)    BUN 5 (*)    Creatinine, Ser 3.09 (*)    Calcium 7.5 (*)    Total Protein 5.8 (*)    Albumin 2.5 (*)    GFR calc non Af Amer 18 (*)    GFR calc Af Amer 21 (*)    All other components within normal limits  URINALYSIS, ROUTINE W REFLEX MICROSCOPIC - Abnormal; Notable for the following  components:   Color, Urine AMBER (*)    APPearance CLOUDY (*)    Ketones, ur 5 (*)    Protein, ur >=300 (*)    Leukocytes,Ua TRACE (*)    Bacteria, UA MANY (*)    Non Squamous Epithelial 0-5 (*)    All other components within normal limits  LACTIC ACID, PLASMA - Abnormal; Notable for the following components:   Lactic Acid, Venous 5.5 (*)    All other components within normal limits  LACTIC ACID, PLASMA - Abnormal; Notable for the following components:   Lactic Acid, Venous 2.3 (*)    All other components within normal limits  CBC - Abnormal; Notable for the following components:   RBC 2.52 (*)    Hemoglobin 7.6 (*)    HCT 24.5 (*)    RDW 16.2 (*)    Platelets 139 (*)    All other components within normal limits  BASIC METABOLIC PANEL - Abnormal; Notable for the following components:   Potassium 2.9 (*)    Creatinine, Ser 3.68 (*)    Calcium 7.9 (*)    GFR calc non Af Amer 15 (*)    GFR calc Af Amer 17 (*)    All other components within normal limits  CBG MONITORING, ED - Abnormal; Notable for the following components:   Glucose-Capillary 109 (*)    All other components within normal limits  SARS CORONAVIRUS 2 (TAT 6-24 HRS)  RAPID URINE DRUG SCREEN, HOSP PERFORMED  ETHANOL  MAGNESIUM  MAGNESIUM  PHOSPHORUS  GLUCOSE, CAPILLARY  I-STAT BETA HCG BLOOD, ED ([facility], [facility], [facility] ONLY)  CBG MONITORING, ED  CBG MONITORING, ED  CBG MONITORING, ED  CBG MONITORING, ED  CBG MONITORING, ED    EKG EKG Interpretation  Date/Time:  Thursday November 29 2019 07:13:19 EST Ventricular Rate:  133 PR Interval:    QRS Duration: 77 QT Interval:  313 QTC Calculation: 466 R Axis:   93 Text Interpretation: Age not entered, assumed to be  39 years old for purpose of ECG interpretation Sinus tachycardia Anterior infarct, old Confirmed by Virgel Manifold [phone]) on 11/29/2019 7:45:32 AM   Radiology EEG  Result Date: 11/29/2019 Lora Havens, MD     11/29/2019  1:49 PM Patient Name: Kathy Phelps MRN: [id number] Epilepsy Attending: Lora Havens Referring Physician/Provider: Dr. Zeb Comfort Date: 11/29/2019 Duration: 24.45 minutes Patient history: 39 year old female presented with multiple generalized tonic-clonic seizures, continues to be altered.  EEG to evaluate for subclinical status epilepticus. Level of alertness: Lethargic, asleep AEDs during EEG study: Ativan, Keppra Technical aspects: This EEG study was done with scalp electrodes positioned according to the 10-20 International system of electrode placement. Electrical activity was acquired at a sampling rate of 500Hz  and reviewed with a high frequency filter of 70Hz  and a low frequency filter of 1Hz . EEG data were recorded continuously and digitally stored. Description: During awake state, no clear posterior dominant was seen.  EEG showed generalized 5-6 hertz theta slowing.  Sleep was characterized by vertex waves, sleep spindles (12 to 15 Hz), maximal frontocentral.  Hyperventilation and photic stimulation were not performed. Abnormality -Continuous slow, generalized IMPRESSION: This study is suggestive of moderate diffuse encephalopathy, nonspecific to etiology but could be secondary to postictal state.  No seizures or epileptiform discharges were seen throughout the recording. Lora Havens   CT Head Wo Contrast  Result Date: 11/29/2019 CLINICAL DATA:  Seizure, abnormal  neuro exam. Additional history provided: Patient with 2 witnessed seizures this morning, unresponsive on arrival. EXAM: CT HEAD WITHOUT CONTRAST TECHNIQUE: Contiguous axial images were obtained from the base of the skull through the vertex without intravenous contrast. COMPARISON:  Head CT 07/16/2014 FINDINGS: Brain: There is a small focus of abnormal hypodensity within the cortical/subcortical right parietal lobe (series 3, image 22) (series 6, image 21). Findings are suspicious for acute/early subacute infarct. Multiple small foci of abnormal hypodensity  within the subcortical white matter and possibly cortex of the left parietooccipital lobe (series 3, image 20)(series 5, images 53-58) (series 5, image 58). Findings may be post ischemic in etiology, age indeterminate. There is a more circumscribed focus of abnormal hypodensity within the right occipital lobe white matter (series 3, image 13). This is suspected related to a more remote insult, possibly post ischemic. Tiny circumscribed hypodensity within the right parietal subcortical white matter consistent with age-indeterminate lacunar infarct (series 3, image 23) (series 5, image 47). There is no evidence of acute intracranial hemorrhage. No midline shift or extra-axial fluid collection. No ventriculomegaly. Cerebral volume is normal for age. Vascular: No hyperdense vessel. Skull: Normal. Negative for fracture or focal lesion. Sinuses/Orbits: Visualized orbits demonstrate no acute abnormality. Small left maxillary sinus mucous retention cyst. No significant mastoid effusion. Other: Numerous carious teeth. IMPRESSION: Multifocal cortical/subcortical abnormal hypodensity within the bilateral parietooccipital lobes. A small focus within the right parietal lobe is suspicious for acute/early subacute infarct. Age-indeterminate lacunar infarct within the right parietal subcortical white matter. The additional foci may also be post ischemic, some age-indeterminate and some appearing more remote. Brain MRI is recommended for further evaluation and to exclude alternative etiologies (including posterior reversible encephalopathy syndrome given provided history of seizure). Electronically Signed   By: Kellie Simmering DO   On: 11/29/2019 08:42   DG Chest Port 1 View  Result Date: 11/30/2019 CLINICAL DATA:  Seizure. EXAM: PORTABLE CHEST 1 VIEW COMPARISON:  11/29/2019. FINDINGS: Dual-lumen catheter tip in unchanged position over the right atrium. Cardiomegaly with bilateral pulmonary infiltrates/edema and bilateral pleural  effusions. Findings most consistent with CHF. Findings have progressed from prior exam. No pneumothorax. IMPRESSION: 1. Dual-lumen catheter tip in unchanged position over the right atrium. 2. Cardiomegaly with bilateral pulmonary infiltrates/edema bilateral pleural effusions. Findings most consistent CHF. Findings have progressed from prior exam. Electronically Signed   By: Marcello Moores  Register   On: 11/30/2019 06:56   DG Chest Port 1 View  Result Date: 11/29/2019 CLINICAL DATA:  Altered mental status EXAM: PORTABLE CHEST 1 VIEW COMPARISON:  October 31, 2019 FINDINGS: Central catheter tip is in the right atrium. No pneumothorax. There are pleural effusions bilaterally. There is consolidation in the left lower lobe and medial right base regions. Heart is enlarged with pulmonary vascularity normal. No adenopathy. No bone lesions. IMPRESSION: Pleural effusions bilaterally. Consolidation left lower lobe and medial right base. Cardiomegaly. Central catheter tip in right atrium without pneumothorax. No adenopathy. Electronically Signed   By: Lowella Grip III M.D.   On: 11/29/2019 14:09    Procedures Procedures (including critical care time)  CRITICAL CARE Performed by: Virgel Manifold Total critical care time: 35 minutes Critical care time was exclusive of separately billable procedures and treating other patients. Critical care was necessary to treat or prevent imminent or life-threatening deterioration. Critical care was time spent personally by me on the following activities: development of treatment plan with patient and/or surrogate as well as nursing, discussions with consultants, evaluation of patient's response to treatment, examination of  patient, obtaining history from patient or surrogate, ordering and performing treatments and interventions, ordering and review of laboratory studies, ordering and review of radiographic studies, pulse oximetry and re-evaluation of patient's  condition.   Medications Ordered in ED Medications  LORazepam (ATIVAN) 2 MG/ML injection (has no administration in time range)  levETIRAcetam (KEPPRA) IVPB 1000 mg/100 mL premix (1,000 mg Intravenous New Bag/Given 11/29/19 0723)  LORazepam (ATIVAN) 2 MG/ML injection (2 mg  Given 11/29/19 [id number])    ED Course  I have reviewed the triage vital signs and the nursing notes.  Pertinent labs & imaging results that were available during my care of the patient were reviewed by me and considered in my medical decision making (see chart for details).    MDM Rules/Calculators/A&P                      39 year old female with what appears to be new onset seizure.  Multiple seizures this morning witnessed by EMS and again here in the emergency room.  5 mg of Versed by EMS.  Additional 2 mg of Ativan given and then Keppra.   Diminished GCS.  Likely postictal on arrival and subsequently had another seizure.  Abated with Ativan.  Intubation deferred at this time.  Reconsider if mental status does not improve or she continues to seize. . Final Clinical Impression(s) / ED Diagnoses Final diagnoses:  Respiratory failure ([place])  Status epilepticus [facility])    Rx / DC Orders ED Discharge Orders    None       Virgel Manifold, MD 11/30/19 1147

## 2019-11-29 NOTE — ED Triage Notes (Signed)
Patient presents to ed [facility] states patient was laying in the bed with mother and felt her shaking no history of seizure, ems states patient had 2 seizures with them each lasting 2 mins. Patient was given versed 5 mg IM states patient had dialysis yest . C/o pain and numbness in left arm yest pain was relieved after taking pain medication, upon arrival patient is unresp. 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  0710 seizure in ED lasted approx. 1 min

## 2019-11-29 NOTE — Procedures (Signed)
Patient Name: Kathy Phelps  MRN: [id number]  Epilepsy Attending: Lora Havens  Referring Physician/Provider: Dr. Zeb Comfort Date: 11/29/2019 Duration: 24.45 minutes  Patient history: 39 year old female presented with multiple generalized tonic-clonic seizures, continues to be altered.  EEG to evaluate for subclinical status epilepticus.  Level of alertness: Lethargic, asleep  AEDs during EEG study: Ativan, Keppra  Technical aspects: This EEG study was done with scalp electrodes positioned according to the 10-20 International system of electrode placement. Electrical activity was acquired at a sampling rate of 500Hz  and reviewed with a high frequency filter of 70Hz  and a low frequency filter of 1Hz . EEG data were recorded continuously and digitally stored.   Description: During awake state, no clear posterior dominant was seen.  EEG showed generalized 5-6 hertz theta slowing.  Sleep was characterized by vertex waves, sleep spindles (12 to 15 Hz), maximal frontocentral.  Hyperventilation and photic stimulation were not performed.  Abnormality -Continuous slow, generalized  IMPRESSION: This study is suggestive of moderate diffuse encephalopathy, nonspecific to etiology but could be secondary to postictal state.  No seizures or epileptiform discharges were seen throughout the recording.     [name] [name]

## 2019-11-29 NOTE — Consult Note (Signed)
Date: 11/29/2019               Patient Name:  Kathy Phelps MRN: [id number]  DOB: [birth year] Age / Sex: 39 y.o., female   PCP: Davonna Belling, MD         Medical Service: Internal Medicine Teaching Service         Attending Physician: Dr. Rebeca Alert Raynaldo Opitz, MD    First Contact: Dr. Benjamine Mola Pager: [phone]  Second Contact: Dr. Maricela Bo Pager: [phone]       After Hours (After 5p/  First Contact Pager: [phone]  weekends / holidays): Second Contact Pager: [phone]   Chief Complaint: "seizure"  History of Present Illness: Per chart review and EDP, Kathy Phelps was in her usual state of health lying in her bed when she began to have generalized seizure activity per her daughter. She has a PMHx notable for ESRD, HTN, bilateral BKA's, and anxiety. Per daughter she had completed HD the prior day without issue. The patient received 5mg  Versed by EMS as well as two 2mg  doses of ativan for recurrent seizure like activity. She was given Keppra 1g following an additional seizure in the ED. Neurology was consulted and based on the CT they felt that the patient was experiencing PRES and would require tight BP control.   Meds:  Current Outpatient Medications  Medication Instructions  . ALPRAZolam (XANAX) 1 mg, Oral, 3 times daily PRN  . gabapentin (NEURONTIN) 800 mg, Oral, 2 times daily  . Latuda 60 mg, Oral, Daily at bedtime  . Norvasc 2.5 mg, Oral, Daily  . oxyCODONE (OXY IR/ROXICODONE) 5 mg, Oral, 4 times daily  . Vitamin D (Ergocalciferol) (DRISDOL) 50,000 Units, Oral, Weekly   Allergies: Allergies as of 11/29/2019 - Review Complete 11/01/2019  Allergen Reaction Noted  . Hydrocodone-acetaminophen Hives 07/16/2014  . Tylenol [acetaminophen] Itching and Swelling 02/27/2015   Past Medical History:  Diagnosis Date  . Anxiety   . Asthma   . Bipolar affective ([place])   . Complete miscarriage   . Constipation   . 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 Depression    . Diabetes mellitus    Type II  . Dyspnea     " only at night and I stop breathing in my sleep too for about the last three weeks"  . GERD (gastroesophageal reflux disease)   . Headache    "migraraines"  . Hyperlipidemia   . Hypertension   . Osteomyelitis ([place])    left foot  . Pregnancy complication    HELP Syndrome  . Renal disorder   . Schizophrenia ([place])   . Stroke [facility])    " mild stroke", memory loss- approx 2017   Family History:  Family History  Problem Relation Age of Onset  . Diabetes Mellitus II Mother   . Heart disease Mother   . Heart disease Father    Social History:  Social History   Tobacco Use  . Smoking status: Current Every Day Smoker    Packs/day: 1.00    Years: 28.00    Pack years: 28.00    Types: Cigarettes  . Smokeless tobacco: Never Used  Substance Use Topics  . Alcohol use: No  . Drug use: No   Review of Systems: Unable to obtain due to her mental status.  Physical Exam: Blood pressure (!) 169/99, pulse 81, temperature (!) 97.5 F (36.4 C), temperature source Rectal, resp. rate 20, height 4\' 1"  (1.245 m),  weight 77.3 kg, SpO2 100 %. General: Altered, afebrile, non-diaphoretic but hypoxic on room air HENT: Atraumatic, blood around the mouth Eyes: PEERL, upward gaze Cardio: RRR, no mrg's Pulmonary: Bilateral BKA, no drainage or erythema of the BLE  Neuro: Patient moves all four extremities spontaneously but does not respond to commands. There is withdrawal to pain Psych: Unable to assess  EKG: personally reviewed my interpretation is sinus tachycardai  CT Head:  IMPRESSION: Multifocal cortical/subcortical abnormal hypodensity within the bilateral parietooccipital lobes. A small focus within the right parietal lobe is suspicious for acute/early subacute infarct. Age-indeterminate lacunar infarct within the right parietal subcortical white matter. The additional foci may also be post ischemic, some age-indeterminate and some  appearing more remote. Brain MRI is recommended for further evaluation and to exclude alternative etiologies (including posterior reversible encephalopathy syndrome given provided history of seizure).  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 She was treated with benzodiazepines and Keppra in the ER.   Plan: Seizure: Events highly consistent with seizure based on report from the family, EMS and ER physician. Neurology to see after MRI now that she appears more stable. Concern for status given her prolonged confusion vs post ictal state. Etiology for the seizure uncertain. She was given a low dose Keppra bolus and ativan per EDP. CT head demonstrating multifocal cortical/subcortial abnormal hypodensity which could favor infarct given prior age-indeterminate lacunar infact within the right parietal subcortical white matter. MRI recommended to better delineate given the alternative diagnosis of PRES which may not be demonstrated this early in the process. Neurology feels that this is most likely PRES and recommended strict BP control.  Plan: -F/up MRI -Labetalol 5mg  IV Q2 hrs PRN for Systolic BP 0000000 -Seizure precautions  -Continue ativan 2mg  IV PRN for seizure q59min until seizure aborted  ESRD on HD MWF: Last HD the day prior per family. No emergent indication for HD based on her labs, EKG, or exam. Consulted Dr. Marval Regal with Nephrology to assist with HD when indicated. Plan: -HD per nephro  HTN: Will need strict BP control in the setting of likely PRES. One dose of IV labetalol was ineffective at reducing her BP significantly as expected. Dr. Hortense Ramal, the epileptologist, is  recommending consulting PCCM for ICU management with a cardene drip. I do feel that if PRES is the most likely diagnosis that she would be better suited being treated with a IV infusion than intermittent IV pushes of medications.  Plan: -IV labetalol 5mg  PRN -Await evaluation by PCCM  Anxiety: Hold home zanax 1mg  TID and per the PDMP databse she appears to fill this script approximately every 45 days and has been on and off treatment for >2 years.  Plan: -Continue ativan PRN for seizure -CIWA protocol ordered  Diet: NPO Code: Defaulted to full DVT PPX: Heparin Dispo: Admit patient to Inpatient with expected length of stay greater than 2 midnights.  Signed: Kathi Ludwig, MD 11/29/2019, 10:48 AM

## 2019-11-29 NOTE — Consult Note (Addendum)
Neurology Consultation Reason for Consult: ams, seizure Referring Physician: Dr Virgel Manifold  CC: ams, seizure  History is obtained from:chart review as patient is altered and unable to provide history  HPI: Kathy Phelps is a 39 y.o. female with past medical history of insulin-dependent type 2 diabetes complicated with ESRD and peripheral vascular disease status post bilateral BKA, hypertension, anxiety and depression, history of noncompliance with insulin, and dialysis, who was brought in by EMS for seizures.  Per review of ED notes, patient was sleeping in bed with her daughter and suddenly started having seizure-like episode.  EMS was called and patient reportedly had 2 generalized tonic-clonic seizures in route to [facility], ED for which she was administered 5 mg of Versed.  On arrival to the emergency department she was noted to have another generalized tonic-clonic seizure and received 2 mg of IV Ativan as well as loaded with Keppra 1000 mg.  CT head was performed which was concerning for PRES vs acute/subacute infarct in bilateral parieto-occipital region.  Neurology was consulted for further management.  ROS: Unable to obtain due to altered mental status.   Past Medical History:  Diagnosis Date  . Anxiety   . Asthma   . Bipolar affective ([place])   . Complete miscarriage   . Constipation   . 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 Depression   . Diabetes mellitus    Type II  . Dyspnea     " only at night and I stop breathing in my sleep too for about the last three weeks"  . GERD (gastroesophageal reflux disease)   . Headache    "migraraines"  . Hyperlipidemia   . Hypertension   . Osteomyelitis ([place])    left foot  . Pregnancy complication    HELP Syndrome  . Renal disorder   . Schizophrenia ([place])   . Stroke [facility])    " mild stroke", memory loss- approx 2017    Family History  Problem Relation Age of Onset  . Diabetes Mellitus II Mother   . Heart  disease Mother   . Heart disease Father     Social History: Per chart review reports that she has been smoking cigarettes. She has a 28.00 pack-year smoking history. She has never used smokeless tobacco. She reports that she does not drink alcohol or use drugs.   Exam: Current vital signs: BP (!) 168/95   Pulse 84   Temp (!) 97.5 F (36.4 C) (Rectal)   Resp (!) 21   Ht 4\' 1"  (1.245 m) Comment: bilateral BKA  Wt 77.3 kg   SpO2 100%   BMI 49.90 kg/m  Vital signs in last 24 hours: Temp:  [97.5 F (36.4 C)] 97.5 F (36.4 C) (02/18 0732) Pulse Rate:  [84-111] 84 (02/18 0845) Resp:  [16-26] 21 (02/18 0845) BP: (162-196)/(95-139) 168/95 (02/18 0845) SpO2:  [96 %-100 %] 100 % (02/18 0845) Weight:  [77.3 kg] 77.3 kg (02/18 0738)   Physical Exam  Constitutional: Appears well-developed and well-nourished.  Psych: Unable to assess as patient is altered Eyes: No scleral injection HENT: No OP obstrucion Head: Normocephalic, atraumatic Cardiovascular: Normal rate and regular rhythm.  Respiratory: Effort normal, non-labored breathing on nonrebreather GI: Soft.  No distension. There is no tenderness.  Extremities: Warm, bilateral BKA  Neuro: Opens eyes to noxious stimuli, does not follow commands, PERRLA, oculocephalic reflex intact, corneal reflex intact, gag reflex intact, spontaneously moving all 4 extremities against gravity, normal tone  in all 4 extremities   I have reviewed labs in epic and the results pertinent to this consultation are: Platelets: 141 BUN 5, Creat 3.09 Albumin 2.5 Lactic acid: 5.5 UA: Leucocytes Trace, Bacteria Many, WBC 6-10 UDS: negative for opiates, cocaine, benzodiazepines, amphetamines, THC, barbiturates.  I have reviewed the images obtained: CT head 11/29/2019: Multifocal cortical/subcortical abnormal hypodensity within the bilateral parietooccipital lobes. A small focus within the right parietal lobe is suspicious for acute/early subacute  infarct. Age-indeterminate lacunar infarct within the right parietal subcortical white matter. The additional foci may also be post ischemic, some age-indeterminate and some appearing more remote.   ASSESSMENT/PLAN: 39 year old female with history of type 2 diabetes, end-stage renal disease on dialysis presented after multiple generalized tonic-clonic seizures.  CT head concerning for PRES vs stroke.  Seizure, suspected provoked Acute encephalopathy, likely postictal as well as hypertensive Suspected PRES Type 2 diabetes End-stage renal disease on hemodialysis Hypertension Anxiety/depression -Acute encephalopathy likely secondary to postictal state as well as hypertensive emergency  Recommendations -Patient had multiple generalized tonic-clonic/seizures and has not returned to baseline.  This could be just a prolonged postictal state however cannot rule out subclinical status epilepticus.  Therefore will order stat EEG -Recommend SBP less than 140 for management of hypertensive emergency/PRES.  - Will order labetalol 5 mg stat, recheck blood pressure.  If it continues to be elevated, will require Cardene drip and close monitoring of blood pressure. -We will start Keppra 500 mg twice daily (adjusted maximum for end-stage renal disease).  Can administer morning dose after dialysis on days patient receives hemodialysis or supplement after dialysis -MRI brain without contrast to assess for acute stroke -Neurochecks every 4 hours -Seizure precautions -As needed IV Ativan for generalized tonic-clonic seizure lasting more than 2 minutes, focal seizure lasting more than 5 minutes -Management of rest of the comorbidities per primary team  Thank you for allowing [place] to participate in the care of this patient.  Neurology will follow.  Please page neuro hospitalist for any further questions after 5 PM.   Margrett Kalb [name]

## 2019-11-29 NOTE — ED Notes (Signed)
C-collar removed per Dr. Wilson Singer.

## 2019-11-29 NOTE — ED Notes (Signed)
Provided patient's sister with update with patient's consent.

## 2019-11-29 NOTE — ED Notes (Signed)
Patient is flailing all over the stretcher, will not lay still ME aware will give orders for soft restraints

## 2019-11-29 NOTE — Progress Notes (Signed)
EEG complete - results pending 

## 2019-11-29 NOTE — ED Notes (Signed)
EEG at bedside.

## 2019-11-29 NOTE — Progress Notes (Signed)
Pt brought to MRI and attempted.  Pt moving all over and uncooperative.  Pt sent back to ER.  Please contact us when Pt is more cooperative and MRI will be attempted again.

## 2019-11-29 NOTE — ED Notes (Signed)
Pt transitioned from [facility] to [place] @ 4 lpm

## 2019-11-30 ENCOUNTER — Inpatient Hospital Stay (HOSPITAL_COMMUNITY): Payer: Medicare Other

## 2019-11-30 DIAGNOSIS — I639 Cerebral infarction, unspecified: Secondary | ICD-10-CM

## 2019-11-30 DIAGNOSIS — R569 Unspecified convulsions: Secondary | ICD-10-CM

## 2019-11-30 LAB — RENAL FUNCTION PANEL
Albumin: 2.1 g/dL — ABNORMAL LOW (ref 3.5–5.0)
Anion gap: 9 (ref 5–15)
BUN: 6 mg/dL (ref 6–20)
CO2: 26 mmol/L (ref 22–32)
Calcium: 7.6 mg/dL — ABNORMAL LOW (ref 8.9–10.3)
Chloride: 101 mmol/L (ref 98–111)
Creatinine, Ser: 4.08 mg/dL — ABNORMAL HIGH (ref 0.44–1.00)
GFR calc Af Amer: 15 mL/min — ABNORMAL LOW (ref >60)
GFR calc non Af Amer: 13 mL/min — ABNORMAL LOW (ref >60)
Glucose, Bld: 88 mg/dL (ref 70–99)
Phosphorus: 2.9 mg/dL (ref 2.5–4.6)
Potassium: 3.1 mmol/L — ABNORMAL LOW (ref 3.5–5.1)
Sodium: 136 mmol/L (ref 135–145)

## 2019-11-30 LAB — CBC
HCT: 24.5 % — ABNORMAL LOW (ref 36.0–46.0)
HCT: 24.1 % — ABNORMAL LOW (ref 36.0–46.0)
Hemoglobin: 7.6 g/dL — ABNORMAL LOW (ref 12.0–15.0)
Hemoglobin: 7.5 g/dL — ABNORMAL LOW (ref 12.0–15.0)
MCH: 30.2 pg (ref 26.0–34.0)
MCH: 30.4 pg (ref 26.0–34.0)
MCHC: 31 g/dL (ref 30.0–36.0)
MCHC: 31.1 g/dL (ref 30.0–36.0)
MCV: 97.2 fL (ref 80.0–100.0)
MCV: 97.6 fL (ref 80.0–100.0)
Platelets: 139 10*3/uL — ABNORMAL LOW (ref 150–400)
Platelets: 148 10*3/uL — ABNORMAL LOW (ref 150–400)
RBC: 2.52 MIL/uL — ABNORMAL LOW (ref 3.87–5.11)
RBC: 2.47 MIL/uL — ABNORMAL LOW (ref 3.87–5.11)
RDW: 16.2 % — ABNORMAL HIGH (ref 11.5–15.5)
RDW: 16.1 % — ABNORMAL HIGH (ref 11.5–15.5)
WBC: 8.9 10*3/uL (ref 4.0–10.5)
WBC: 8.4 10*3/uL (ref 4.0–10.5)
nRBC: 0.2 % (ref 0.0–0.2)
nRBC: 0 % (ref 0.0–0.2)

## 2019-11-30 LAB — BASIC METABOLIC PANEL
Anion gap: 11 (ref 5–15)
BUN: 6 mg/dL (ref 6–20)
CO2: 27 mmol/L (ref 22–32)
Calcium: 7.9 mg/dL — ABNORMAL LOW (ref 8.9–10.3)
Chloride: 100 mmol/L (ref 98–111)
Creatinine, Ser: 3.68 mg/dL — ABNORMAL HIGH (ref 0.44–1.00)
GFR calc Af Amer: 17 mL/min — ABNORMAL LOW (ref >60)
GFR calc non Af Amer: 15 mL/min — ABNORMAL LOW (ref >60)
Glucose, Bld: 77 mg/dL (ref 70–99)
Potassium: 2.9 mmol/L — ABNORMAL LOW (ref 3.5–5.1)
Sodium: 138 mmol/L (ref 135–145)

## 2019-11-30 LAB — GLUCOSE, CAPILLARY
Glucose-Capillary: 105 mg/dL — ABNORMAL HIGH (ref 70–99)
Glucose-Capillary: 116 mg/dL — ABNORMAL HIGH (ref 70–99)
Glucose-Capillary: 63 mg/dL — ABNORMAL LOW (ref 70–99)
Glucose-Capillary: 65 mg/dL — ABNORMAL LOW (ref 70–99)
Glucose-Capillary: 69 mg/dL — ABNORMAL LOW (ref 70–99)
Glucose-Capillary: 71 mg/dL (ref 70–99)
Glucose-Capillary: 75 mg/dL (ref 70–99)

## 2019-11-30 LAB — CBG MONITORING, ED: Glucose-Capillary: 78 mg/dL (ref 70–99)

## 2019-11-30 LAB — PHOSPHORUS: Phosphorus: 3 mg/dL (ref 2.5–4.6)

## 2019-11-30 LAB — MAGNESIUM: Magnesium: 2.2 mg/dL (ref 1.7–2.4)

## 2019-11-30 MED ORDER — DEXTROSE 50 % IV SOLN
INTRAVENOUS | Status: AC
  Administered 2019-11-30: 12.5 g via INTRAVENOUS
  Filled 2019-11-30: qty 50

## 2019-11-30 MED ORDER — DEXTROSE 50 % IV SOLN
25.0000 mL | Freq: Once | INTRAVENOUS | Status: AC
  Administered 2019-11-30: 25 mL via INTRAVENOUS

## 2019-11-30 MED ORDER — SODIUM CHLORIDE 0.9 % IV SOLN: 100.0000 mL | INTRAVENOUS | Status: DC | PRN

## 2019-11-30 MED ORDER — ALTEPLASE 2 MG IJ SOLR
2.0000 mg | Freq: Once | INTRAMUSCULAR | Status: DC | PRN
  Filled 2019-11-30: qty 2

## 2019-11-30 MED ORDER — LIDOCAINE HCL (PF) 1 % IJ SOLN: 5.0000 mL | INTRAMUSCULAR | Status: DC | PRN

## 2019-11-30 MED ORDER — NEPRO/CARBSTEADY PO LIQD: 237.0000 mL | Freq: Two times a day (BID) | ORAL | Status: DC

## 2019-11-30 MED ORDER — HEPARIN SODIUM (PORCINE) 1000 UNIT/ML DIALYSIS: 1000.0000 [IU] | INTRAMUSCULAR | Status: DC | PRN

## 2019-11-30 MED ORDER — CHLORHEXIDINE GLUCONATE CLOTH 2 % EX PADS
6.0000 | MEDICATED_PAD | Freq: Every day | CUTANEOUS | Status: DC
  Administered 2019-11-30 – 2019-12-01 (×2): 6 via TOPICAL

## 2019-11-30 MED ORDER — PENTAFLUOROPROP-TETRAFLUOROETH EX AERO: 1.0000 "application " | INHALATION_SPRAY | CUTANEOUS | Status: DC | PRN

## 2019-11-30 MED ORDER — AMLODIPINE BESYLATE 5 MG PO TABS
2.5000 mg | ORAL_TABLET | Freq: Every day | ORAL | Status: DC
  Administered 2019-11-30: 2.5 mg via ORAL
  Filled 2019-11-30: qty 1

## 2019-11-30 MED ORDER — DEXTROSE 50 % IV SOLN: 12.5000 g | INTRAVENOUS | Status: AC

## 2019-11-30 MED ORDER — ASPIRIN 81 MG PO CHEW
81.0000 mg | CHEWABLE_TABLET | Freq: Every day | ORAL | Status: DC
  Administered 2019-11-30: 81 mg via ORAL
  Filled 2019-11-30: qty 1

## 2019-11-30 MED ORDER — POTASSIUM CHLORIDE 10 MEQ/100ML IV SOLN
10.0000 meq | INTRAVENOUS | Status: AC
  Administered 2019-11-30 (×2): 10 meq via INTRAVENOUS
  Filled 2019-11-30 (×2): qty 100

## 2019-11-30 MED ORDER — ATORVASTATIN CALCIUM 40 MG PO TABS
40.0000 mg | ORAL_TABLET | Freq: Every day | ORAL | Status: DC
  Administered 2019-11-30: 40 mg via ORAL
  Filled 2019-11-30: qty 1

## 2019-11-30 MED ORDER — HEPARIN SODIUM (PORCINE) 1000 UNIT/ML DIALYSIS: 20.0000 [IU]/kg | INTRAMUSCULAR | Status: DC | PRN

## 2019-11-30 MED ORDER — INSULIN ASPART 100 UNIT/ML ~~LOC~~ SOLN: 0.0000 [IU] | Freq: Three times a day (TID) | SUBCUTANEOUS | Status: DC

## 2019-11-30 MED ORDER — DIPHENHYDRAMINE HCL 25 MG PO CAPS
25.0000 mg | ORAL_CAPSULE | Freq: Four times a day (QID) | ORAL | Status: DC | PRN
  Administered 2019-11-30 – 2019-12-01 (×2): 25 mg via ORAL
  Filled 2019-11-30 (×2): qty 1

## 2019-11-30 MED ORDER — LIDOCAINE-PRILOCAINE 2.5-2.5 % EX CREA
1.0000 "application " | TOPICAL_CREAM | CUTANEOUS | Status: DC | PRN
  Filled 2019-11-30: qty 5

## 2019-11-30 NOTE — Progress Notes (Addendum)
Subjective: No acute events overnight. Patient awake, following commands this morning. Asked when she can go home. Also reports bifrontal headache, denies change in vision.  ROS: negative except above  Examination  Vital signs in last 24 hours: Temp:  [97.4 F (36.3 C)-98.2 F (36.8 C)] 97.8 F (36.6 C) (02/19 1100) Pulse Rate:  [46-91] 82 (02/19 1300) Resp:  [0-37] 14 (02/19 1300) BP: (110-162)/(64-101) 147/83 (02/19 1100) SpO2:  [86 %-100 %] 90 % (02/19 1300)  General: lying in bed, not in apparent distress CVS: pulse-normal rate and rhythm RS: breathing comfortably, CTA B Extremities: normal, bilateral BKA  Neuro: MS: Alert, oriented to place and person, not to time, follows commands CN: pupils equal and reactive,  EOMI, face symmetric, tongue midline, normal sensation over face Motor: 4/5 strength in bilateral upper extremities, antigravity strength in bilateral lower extremities Reflexes: 2+ bilaterally over biceps Coordination: normal FTN bilaterally Gait: not tested  Basic Metabolic Panel: Recent Labs  Lab 11/29/19 0725 11/30/19 0344  NA 137 138  K 2.8* 2.9*  CL 98 100  CO2 25 27  GLUCOSE 143* 77  BUN 5* 6  CREATININE 3.09* 3.68*  CALCIUM 7.5* 7.9*  MG 1.7 2.2  PHOS  --  3.0    CBC: Recent Labs  Lab 11/29/19 0725 11/30/19 0344  WBC 9.0 8.9  NEUTROABS 6.3  --   HGB 7.4* 7.6*  HCT 24.6* 24.5*  MCV 99.2 97.2  PLT 141* 139*     Coagulation Studies: No results for input(s): LABPROT, INR in the last 72 hours.  Imaging  CT head 11/29/2019: Multifocal cortical/subcortical abnormal hypodensity within the bilateral parietooccipital lobes. A small focus within the right parietal lobe is suspicious for acute/early subacute infarct. Age-indeterminate lacunar infarct within the right parietal subcortical white matter. The additional foci may also be post ischemic, some age-indeterminate and some appearing more remote.  MR brain 11/30/2019:  1. Acute infarcts  in the deep white matter of the left frontal lobe and left cerebellar hemisphere. 2. Multiple foci of increased signal on DWI and FLAIR within the centrum semiovale, corona radiata and subcortical white matter bilaterally, without corresponding low signal on the ADC map, may represent subacute infarcts in watershed zones. 3. 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      ASSESSMENT AND PLAN: 39 year old female with history of type 2 diabetes, end-stage renal disease on dialysis presented after multiple generalized tonic-clonic seizures.  CT head concerning for PRES vs stroke.  Seizure, suspected provoked Acute encephalopathy, likely postictal as well as hypertensive ( improving) Acute ischemic stroke End-stage renal disease on hemodialysis Hypertension Anxiety/depression -Acute encephalopathy likely secondary to postictal state as well as hypertensive emergency - Acute stroke suspected embolic. Was not a tpa candidate due to unclear LKN  Recommendations - Discussed with stroke team: Dr Leonie Man. - Ordered CTA head and neck to assess for vascular stenosis/occlusion - Ordered ECHO to assess for cardioembolic etiology - Patient has BKA, therefore also ordered LE doppler to assess for DVT - Will start ASA 81mg  daily and atorvastatin 40mg  daily for secondary stroke prevention  - Continue Keppra 500 mg twice daily (adjusted maximum for end-stage renal disease).  Can administer morning dose after dialysis on days patient receives hemodialysis or supplement after dialysis -Neurochecks every 4 hours -Seizure precautions, SBP<220/120 with goal for gradual normotension over next few days -As needed IV Ativan for generalized tonic-clonic seizure lasting more than 2 minutes, focal seizure lasting more than 5 minutes -Management of rest of the  comorbidities per primary team  Thank you for allowing [place] to participate in the care of this patient.  Stroke team will follow from  tomorrow.  Please page neuro hospitalist for any further questions after 5 PM.  I have spent a total of 35 minutes with the patient reviewing hospital notes,  test results, labs and examining the patient as well as establishing an assessment and plan that was discussed personally with the patient.  > 50% of time was spent in direct patient care.    Conlee Sliter [name]

## 2019-11-30 NOTE — ED Notes (Signed)
Pt asleep, attempted to remove soft wrist restraints. Within 10 minutes, pt began pulling off cardiac monitor and pulling at IV. Restraints reapplied.

## 2019-11-30 NOTE — ED Notes (Signed)
ED TO INPATIENT HANDOFF REPORT  ED Nurse Name and Phone #: Apolonio Schneiders [phone]  S Name/Age/Gender [name] 40 y.o. female Room/Bed: 034C/034C  Code Status   Code Status: Full Code  Home/SNF/Other Home Patient oriented to: self Is this baseline? No   Triage Complete: Triage complete  Chief Complaint Seizure [facility]) [R56.9]  Triage Note Patient [facility] states patient was laying in the bed with mother and felt her shaking no history of seizure, ems states patient had 2 seizures with them each lasting 2 mins. Patient was given versed 5 mg IM states patient had dialysis yest . C/o pain and numbness in left arm yest pain was relieved after taking pain medication, upon arrival patient is unresp. 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  0710 seizure in ED lasted approx. 1 min    Allergies Allergies  Allergen Reactions  . Hydrocodone-Acetaminophen Hives  . Tylenol [Acetaminophen] Itching and Swelling    Patient tolerated APAP 650 mg (06/29/18) as well as oxycodone/APAP 5-325 mg during 06/25/2018 admission ??    Level of Care/Admitting Diagnosis ED Disposition    ED Disposition Condition [facility] Area: [place] [[id number]]  Level of Care: ICU [6]  Covid Evaluation: Confirmed COVID Negative  Date Laboratory Confirmed COVID Negative: 11/22/2019  Diagnosis: Seizure ([place]) [id number]  Admitting Physician: Chesley Mires [[id number]]  Attending Physician: Chesley Mires [[id number]]  Estimated length of stay: 5 - 7 days  Certification:: I certify this patient will need inpatient services for at least 2 midnights       B Medical/Surgery History Past Medical History:  Diagnosis Date  . Anxiety   . Asthma   . Bipolar affective ([place])   . Complete miscarriage   . Constipation   . 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 Depression   . Diabetes mellitus    Type II  . Dyspnea     " only at night and I stop breathing in my sleep too  for about the last three weeks"  . GERD (gastroesophageal reflux disease)   . Headache    "migraraines"  . Hyperlipidemia   . Hypertension   . Osteomyelitis ([place])    left foot  . Pregnancy complication    HELP Syndrome  . Renal disorder   . Schizophrenia ([place])   . Stroke [facility])    " mild stroke", memory loss- approx 2017   Past Surgical History:  Procedure Laterality Date  . ABDOMINAL AORTOGRAM N/A 09/07/2017   Procedure: ABDOMINAL AORTOGRAM;  Surgeon: Waynetta Sandy, MD;  Location: [place] [facility];  Service: Cardiovascular;  Laterality: N/A;  . ABDOMINAL AORTOGRAM W/LOWER EXTREMITY N/A 02/03/2017   Procedure: Abdominal Aortogram w/Lower Extremity;  Surgeon: Waynetta Sandy, MD;  Location: [place] [facility];  Service: Cardiovascular;  Laterality: N/A;  . ABDOMINAL AORTOGRAM W/LOWER EXTREMITY N/A 06/27/2018   Procedure: ABDOMINAL AORTOGRAM W/LOWER EXTREMITY;  Surgeon: Serafina Mitchell, MD;  Location: [place] [facility];  Service: Cardiovascular;  Laterality: N/A;  . AMPUTATION Left 02/10/2017   Procedure: AMPUTATION TOES 3, 4 AND 5  LEFT FOOT;  Surgeon: Waynetta Sandy, MD;  Location: [place];  Service: Vascular;  Laterality: Left;  . AMPUTATION Left 12/21/2017   Procedure: LEFT FOOT 5TH RAY AMPUTATION;  Surgeon: Newt Minion, MD;  Location: [place];  Service: Orthopedics;  Laterality: Left;  . AMPUTATION Left 02/01/2018   Procedure: LEFT BELOW KNEE AMPUTATION;  Surgeon: Newt Minion, MD;  Location: [place];  Service: Orthopedics;  Laterality: Left;  . AMPUTATION Right 08/11/2018   Procedure: AMPUTATION BELOW KNEE;  Surgeon: Newt Minion, MD;  Location: [place];  Service: Orthopedics;  Laterality: Right;  . CESAREAN SECTION    . CHOLECYSTECTOMY    . LOWER EXTREMITY ANGIOGRAPHY Bilateral 09/07/2017   Procedure: Lower Extremity Angiography;  Surgeon: Waynetta Sandy, MD;  Location: [place] [facility];  Service: Cardiovascular;  Laterality: Bilateral;   . PERIPHERAL VASCULAR ATHERECTOMY  02/03/2017   Procedure: Peripheral Vascular Atherectomy;  Surgeon: Waynetta Sandy, MD;  Location: [place] [facility];  Service: Cardiovascular;;  . PERIPHERAL VASCULAR ATHERECTOMY Right 06/27/2018   Procedure: PERIPHERAL VASCULAR ATHERECTOMY;  Surgeon: Serafina Mitchell, MD;  Location: [place] [facility];  Service: Cardiovascular;  Laterality: Right;  superficial femoral  . PERIPHERAL VASCULAR BALLOON ANGIOPLASTY  02/03/2017   Procedure: Peripheral Vascular Balloon Angioplasty;  Surgeon: Waynetta Sandy, MD;  Location: [place] [facility];  Service: Cardiovascular;;  . PERIPHERAL VASCULAR INTERVENTION Left 09/07/2017   Procedure: PERIPHERAL VASCULAR INTERVENTION;  Surgeon: Waynetta Sandy, MD;  Location: [place] [facility];  Service: Cardiovascular;  Laterality: Left;  SFA/POPLITEAL  . STUMP REVISION Left 05/24/2018  . STUMP REVISION Left 05/24/2018   Procedure: REVISION LEFT BELOW KNEE AMPUTATION;  Surgeon: Newt Minion, MD;  Location: [place];  Service: Orthopedics;  Laterality: Left;  . TUBAL LIGATION       A IV Location/Drains/Wounds Patient Lines/Drains/Airways Status   Active Line/Drains/Airways    Name:   Placement date:   Placement time:   Site:   Days:   Peripheral IV 11/29/19 Left Forearm   11/29/19    0720    Forearm   1   Hemodialysis Catheter Right Internal jugular   --    --    Internal jugular      External Urinary Catheter   11/29/19    0747    --   1          Intake/Output Last 24 hours  Intake/Output Summary (Last 24 hours) at 11/30/2019 0429 Last data filed at 11/29/2019 0746 Gross per 24 hour  Intake --  Output 30 ml  Net -30 ml    Labs/Imaging Results for orders placed or performed during the hospital encounter of 11/29/19 (from the past 48 hour(s))  Urinalysis, Routine w reflex microscopic     Status: Abnormal   Collection Time: 11/29/19  7:17 AM  Result Value Ref Range   Color, Urine AMBER (A)  YELLOW    Comment: BIOCHEMICALS MAY BE AFFECTED BY COLOR   APPearance CLOUDY (A) CLEAR   Specific Gravity, Urine 1.013 1.005 - 1.030   pH 8.0 5.0 - 8.0   Glucose, UA NEGATIVE NEGATIVE mg/dL   Hgb urine dipstick NEGATIVE NEGATIVE   Bilirubin Urine NEGATIVE NEGATIVE   Ketones, ur 5 (A) NEGATIVE mg/dL   Protein, ur >=300 (A) NEGATIVE mg/dL   Nitrite NEGATIVE NEGATIVE   Leukocytes,Ua TRACE (A) NEGATIVE   RBC / HPF 0-5 0 - 5 RBC/hpf   WBC, UA 6-10 0 - 5 WBC/hpf   Bacteria, UA MANY (A) NONE SEEN   Squamous Epithelial / LPF 0-5 0 - 5   Mucus PRESENT    Non Squamous Epithelial 0-5 (A) NONE SEEN    Comment: Performed at [facility], [address] [place], [place] [address]  Rapid urine drug screen (hospital performed)  Status: None   Collection Time: 11/29/19  7:17 AM  Result Value Ref Range   Opiates NONE DETECTED NONE DETECTED   Cocaine NONE DETECTED NONE DETECTED   Benzodiazepines NONE DETECTED NONE DETECTED   Amphetamines NONE DETECTED NONE DETECTED   Tetrahydrocannabinol NONE DETECTED NONE DETECTED   Barbiturates NONE DETECTED NONE DETECTED    Comment: (NOTE) DRUG SCREEN FOR MEDICAL PURPOSES ONLY.  IF CONFIRMATION IS NEEDED FOR ANY PURPOSE, NOTIFY LAB WITHIN 5 DAYS. LOWEST DETECTABLE LIMITS FOR URINE DRUG SCREEN Drug Class                     Cutoff (ng/mL) Amphetamine and metabolites    1000 Barbiturate and metabolites    200 Benzodiazepine                 [id number] Tricyclics and metabolites     300 Opiates and metabolites        300 Cocaine and metabolites        300 THC                            50 Performed at [facility], [place] [address] [place], [place] [address]   CBC with Differential     Status: Abnormal   Collection Time: 11/29/19  7:25 AM  Result Value Ref Range   WBC 9.0 4.0 - 10.5 K/uL   RBC 2.48 (L) 3.87 - 5.11 MIL/uL   Hemoglobin 7.4 (L) 12.0 - 15.0 g/dL   HCT 24.6 (L) 36.0 - 46.0 %   MCV 99.2 80.0 - 100.0 fL   MCH 29.8 26.0 - 34.0 pg    MCHC 30.1 30.0 - 36.0 g/dL   RDW 15.7 (H) 11.5 - 15.5 %   Platelets 141 (L) 150 - 400 K/uL   nRBC 0.0 0.0 - 0.2 %   Neutrophils Relative % 70 %   Neutro Abs 6.3 1.7 - 7.7 K/uL   Lymphocytes Relative 12 %   Lymphs Abs 1.1 0.7 - 4.0 K/uL   Monocytes Relative 6 %   Monocytes Absolute 0.5 0.1 - 1.0 K/uL   Eosinophils Relative 11 %   Eosinophils Absolute 1.0 (H) 0.0 - 0.5 K/uL   Basophils Relative 1 %   Basophils Absolute 0.1 0.0 - 0.1 K/uL   Immature Granulocytes 0 %   Abs Immature Granulocytes 0.04 0.00 - 0.07 K/uL    Comment: Performed at [facility], [place] [address], [place], [place] [address]  Comprehensive metabolic panel     Status: Abnormal   Collection Time: 11/29/19  7:25 AM  Result Value Ref Range   Sodium 137 135 - 145 mmol/L   Potassium 2.8 (L) 3.5 - 5.1 mmol/L   Chloride 98 98 - 111 mmol/L   CO2 25 22 - 32 mmol/L   Glucose, Bld 143 (H) 70 - 99 mg/dL   BUN 5 (L) 6 - 20 mg/dL   Creatinine, Ser 3.09 (H) 0.44 - 1.00 mg/dL   Calcium 7.5 (L) 8.9 - 10.3 mg/dL   Total Protein 5.8 (L) 6.5 - 8.1 g/dL   Albumin 2.5 (L) 3.5 - 5.0 g/dL   AST 24 15 - 41 U/L   ALT 9 0 - 44 U/L   Alkaline Phosphatase 72 38 - 126 U/L   Total Bilirubin 1.0 0.3 - 1.2 mg/dL   GFR calc non Af Amer 18 (L) >60 mL/min   GFR calc Af Wyvonnia Lora  21 (L) >60 mL/min   Anion gap 14 5 - 15    Comment: Performed at [facility] [address] [place], [place] [address]  Ethanol     Status: None   Collection Time: 11/29/19  7:25 AM  Result Value Ref Range   Alcohol, Ethyl (B) <10 <10 mg/dL    Comment: (NOTE) Lowest detectable limit for serum alcohol is 10 mg/dL. For medical purposes only. Performed at [facility], [place] [address] [place], [place] [address]   Lactic acid, plasma     Status: Abnormal   Collection Time: 11/29/19  7:25 AM  Result Value Ref Range   Lactic Acid, Venous 5.5 (HH) 0.5 - 1.9 mmol/L    Comment: CRITICAL RESULT CALLED TO, READ BACK BY AND VERIFIED WITH: L.CHILTON,RN  0830 11/29/2019 CLARK,S Performed at [facility], [place] [address], [place], [place] [address]   Magnesium     Status: None   Collection Time: 11/29/19  7:25 AM  Result Value Ref Range   Magnesium 1.7 1.7 - 2.4 mg/dL    Comment: Performed at [facility] [address], [place], [place] [address]  I-Stat Beta hCG blood, ED ([facility], [facility], [facility] only)     Status: None   Collection Time: 11/29/19  7:35 AM  Result Value Ref Range   I-stat hCG, quantitative <5.0 <5 mIU/mL   Comment 3            Comment:   GEST. AGE      CONC.  (mIU/mL)   <=1 WEEK        5 - 50     2 WEEKS       50 - 500     3 WEEKS       100 - 10,000     4 WEEKS     1,000 - 30,000        FEMALE AND NON-PREGNANT FEMALE:     LESS THAN 5 mIU/mL   Lactic acid, plasma     Status: Abnormal   Collection Time: 11/29/19  9:08 AM  Result Value Ref Range   Lactic Acid, Venous 2.3 (HH) 0.5 - 1.9 mmol/L    Comment: CRITICAL VALUE NOTED.  VALUE IS CONSISTENT WITH PREVIOUSLY REPORTED AND CALLED VALUE. Performed at [facility], [place] [address], [place], [place] [address]   SARS CORONAVIRUS 2 (TAT 6-24 HRS) Nasopharyngeal Nasopharyngeal Swab     Status: None   Collection Time: 11/29/19 10:16 AM   Specimen: Nasopharyngeal Swab  Result Value Ref Range   SARS Coronavirus 2 NEGATIVE NEGATIVE    Comment: (NOTE) SARS-CoV-2 target nucleic acids are NOT DETECTED. The SARS-CoV-2 RNA is generally detectable in upper and lower respiratory specimens during the acute phase of infection. Negative results do not preclude SARS-CoV-2 infection, do not rule out co-infections with other pathogens, and should not be used as the sole basis for treatment or other patient management decisions. Negative results must be combined with clinical observations, patient history, and epidemiological information. The expected result is Negative. Fact Sheet for Patients: SugarRoll.be Fact Sheet for Healthcare  Providers: https://www.woods-mathews.com/ This test is not yet approved or cleared by the [place] FDA and  has been authorized for detection and/or diagnosis of SARS-CoV-2 by FDA under an Emergency Use Authorization (EUA). This EUA will remain  in effect (meaning this test can be used) for the duration of the COVID-19 declaration under Section 56 4(b)(1) of the Act,  21 U.S.C. section 360bbb-3(b)(1), unless the authorization is terminated or revoked sooner. Performed at [facility], [place] [address] [place], [place] [address]   CBG monitoring, ED     Status: Abnormal   Collection Time: 11/29/19  1:03 PM  Result Value Ref Range   Glucose-Capillary 109 (H) 70 - 99 mg/dL  CBG monitoring, ED     Status: None   Collection Time: 11/29/19  5:12 PM  Result Value Ref Range   Glucose-Capillary 90 70 - 99 mg/dL  CBG monitoring, ED     Status: None   Collection Time: 11/29/19  7:10 PM  Result Value Ref Range   Glucose-Capillary 80 70 - 99 mg/dL  CBG monitoring, ED     Status: None   Collection Time: 11/29/19  8:41 PM  Result Value Ref Range   Glucose-Capillary 81 70 - 99 mg/dL  CBG monitoring, ED     Status: None   Collection Time: 11/29/19 11:11 PM  Result Value Ref Range   Glucose-Capillary 76 70 - 99 mg/dL  CBC     Status: Abnormal   Collection Time: 11/30/19  3:44 AM  Result Value Ref Range   WBC 8.9 4.0 - 10.5 K/uL   RBC 2.52 (L) 3.87 - 5.11 MIL/uL   Hemoglobin 7.6 (L) 12.0 - 15.0 g/dL   HCT 24.5 (L) 36.0 - 46.0 %   MCV 97.2 80.0 - 100.0 fL   MCH 30.2 26.0 - 34.0 pg   MCHC 31.0 30.0 - 36.0 g/dL   RDW 16.2 (H) 11.5 - 15.5 %   Platelets 139 (L) 150 - 400 K/uL    Comment: REPEATED TO VERIFY   nRBC 0.2 0.0 - 0.2 %    Comment: Performed at [facility], [place]. [address], [place], [place] [id number]  Basic metabolic panel     Status: Abnormal   Collection Time: 11/30/19  3:44 AM  Result Value Ref Range   Sodium 138 135 - 145 mmol/L   Potassium 2.9 (L)  3.5 - 5.1 mmol/L   Chloride 100 98 - 111 mmol/L   CO2 27 22 - 32 mmol/L   Glucose, Bld 77 70 - 99 mg/dL   BUN 6 6 - 20 mg/dL   Creatinine, Ser 3.68 (H) 0.44 - 1.00 mg/dL   Calcium 7.9 (L) 8.9 - 10.3 mg/dL   GFR calc non Af Amer 15 (L) >60 mL/min   GFR calc Af Amer 17 (L) >60 mL/min   Anion gap 11 5 - 15    Comment: Performed at [facility] [address], [place], [place] [address]  Magnesium     Status: None   Collection Time: 11/30/19  3:44 AM  Result Value Ref Range   Magnesium 2.2 1.7 - 2.4 mg/dL    Comment: Performed at [facility] [address], [place], [place] [address]  Phosphorus     Status: None   Collection Time: 11/30/19  3:44 AM  Result Value Ref Range   Phosphorus 3.0 2.5 - 4.6 mg/dL    Comment: Performed at [facility] [address] [place], [place] [address]   EEG  Result Date: 11/29/2019 Lora Havens, MD     11/29/2019  1:49 PM Patient Name: Kathy Phelps MRN: [id number] Epilepsy Attending: Lora Havens Referring Physician/Provider: Dr. Zeb Comfort Date: 11/29/2019 Duration: 24.45 minutes Patient history: 39 year old female presented with multiple generalized tonic-clonic seizures, continues to be altered.  EEG to  evaluate for subclinical status epilepticus. Level of alertness: Lethargic, asleep AEDs during EEG study: Ativan, Keppra Technical aspects: This EEG study was done with scalp electrodes positioned according to the 10-20 International system of electrode placement. Electrical activity was acquired at a sampling rate of 500Hz  and reviewed with a high frequency filter of 70Hz  and a low frequency filter of 1Hz . EEG data were recorded continuously and digitally stored. Description: During awake state, no clear posterior dominant was seen.  EEG showed generalized 5-6 hertz theta slowing.  Sleep was characterized by vertex waves, sleep spindles (12 to 15 Hz), maximal frontocentral.  Hyperventilation and photic stimulation were not  performed. Abnormality -Continuous slow, generalized IMPRESSION: This study is suggestive of moderate diffuse encephalopathy, nonspecific to etiology but could be secondary to postictal state.  No seizures or epileptiform discharges were seen throughout the recording. Lora Havens   CT Head Wo Contrast  Result Date: 11/29/2019 CLINICAL DATA:  Seizure, abnormal neuro exam. Additional history provided: Patient with 2 witnessed seizures this morning, unresponsive on arrival. EXAM: CT HEAD WITHOUT CONTRAST TECHNIQUE: Contiguous axial images were obtained from the base of the skull through the vertex without intravenous contrast. COMPARISON:  Head CT 07/16/2014 FINDINGS: Brain: There is a small focus of abnormal hypodensity within the cortical/subcortical right parietal lobe (series 3, image 22) (series 6, image 21). Findings are suspicious for acute/early subacute infarct. Multiple small foci of abnormal hypodensity within the subcortical white matter and possibly cortex of the left parietooccipital lobe (series 3, image 20)(series 5, images 53-58) (series 5, image 58). Findings may be post ischemic in etiology, age indeterminate. There is a more circumscribed focus of abnormal hypodensity within the right occipital lobe white matter (series 3, image 13). This is suspected related to a more remote insult, possibly post ischemic. Tiny circumscribed hypodensity within the right parietal subcortical white matter consistent with age-indeterminate lacunar infarct (series 3, image 23) (series 5, image 47). There is no evidence of acute intracranial hemorrhage. No midline shift or extra-axial fluid collection. No ventriculomegaly. Cerebral volume is normal for age. Vascular: No hyperdense vessel. Skull: Normal. Negative for fracture or focal lesion. Sinuses/Orbits: Visualized orbits demonstrate no acute abnormality. Small left maxillary sinus mucous retention cyst. No significant mastoid effusion. Other: Numerous  carious teeth. IMPRESSION: Multifocal cortical/subcortical abnormal hypodensity within the bilateral parietooccipital lobes. A small focus within the right parietal lobe is suspicious for acute/early subacute infarct. Age-indeterminate lacunar infarct within the right parietal subcortical white matter. The additional foci may also be post ischemic, some age-indeterminate and some appearing more remote. Brain MRI is recommended for further evaluation and to exclude alternative etiologies (including posterior reversible encephalopathy syndrome given provided history of seizure). Electronically Signed   By: Kellie Simmering DO   On: 11/29/2019 08:42   DG Chest Port 1 View  Result Date: 11/29/2019 CLINICAL DATA:  Altered mental status EXAM: PORTABLE CHEST 1 VIEW COMPARISON:  October 31, 2019 FINDINGS: Central catheter tip is in the right atrium. No pneumothorax. There are pleural effusions bilaterally. There is consolidation in the left lower lobe and medial right base regions. Heart is enlarged with pulmonary vascularity normal. No adenopathy. No bone lesions. IMPRESSION: Pleural effusions bilaterally. Consolidation left lower lobe and medial right base. Cardiomegaly. Central catheter tip in right atrium without pneumothorax. No adenopathy. Electronically Signed   By: Lowella Grip III M.D.   On: 11/29/2019 14:09    Pending Labs Unresulted Labs (From admission, onward)   None      Vitals/Pain  Today's Vitals   11/30/19 0230 11/30/19 0300 11/30/19 0330 11/30/19 0400  BP: 134/81 (!) 141/79 130/76 131/78  Pulse: 82 80 80 83  Resp: 11  10 11   Temp:      TempSrc:      SpO2: 97% 99% 97% 97%  Weight:      Height:      PainSc:        Isolation Precautions No active isolations  Medications Medications  LORazepam (ATIVAN) injection 2 mg (has no administration in time range)  labetalol (NORMODYNE) injection 5 mg (5 mg Intravenous Given 11/29/19 1041)  levETIRAcetam (KEPPRA) IVPB 500 mg/100 mL  premix (0 mg Intravenous Stopped 11/29/19 1848)  heparin injection 5,000 Units (5,000 Units Subcutaneous Given 11/30/19 0207)  nicardipine (CARDENE) 20mg  in 0.86% saline 274ml IV infusion (0.1 mg/ml) (3 mg/hr Intravenous New Bag/Given 11/30/19 0046)  insulin aspart (novoLOG) injection 0-6 Units (0 Units Subcutaneous Not Given 11/29/19 2322)  ondansetron (ZOFRAN) injection 4 mg (has no administration in time range)  sodium chloride flush (NS) 0.9 % injection 10-40 mL (10 mLs Intracatheter Not Given 11/29/19 2239)  sodium chloride flush (NS) 0.9 % injection 10-40 mL (has no administration in time range)  Chlorhexidine Gluconate Cloth 2 % PADS 6 each (6 each Topical Not Given 11/29/19 1501)  levETIRAcetam (KEPPRA) IVPB 1000 mg/100 mL premix (0 mg Intravenous Stopped 11/29/19 0800)  LORazepam (ATIVAN) 2 MG/ML injection (2 mg  Given 11/29/19 0757)  LORazepam (ATIVAN) 2 MG/ML injection (2 mg  Given 11/29/19 0717)  potassium chloride 10 mEq in 100 mL IVPB (0 mEq Intravenous Stopped 11/29/19 1040)  magnesium sulfate IVPB 2 g 50 mL (0 g Intravenous Stopped 11/29/19 1214)  potassium chloride 10 mEq in 100 mL IVPB (0 mEq Intravenous Stopped 11/29/19 1322)    Mobility non-ambulatory     Focused Assessments Cardiac Assessment Handoff:    Lab Results  Component Value Date   CKTOTAL <5 (L) 02/04/2017   TROPONINI <0.03 02/21/2017   Lab Results  Component Value Date   DDIMER 0.94 (H) 02/21/2017   Does the Patient currently have chest pain? No     R Recommendations: See Admitting Provider Note  Report given to:   Additional Notes: Patient on Cardene drip at 3 mg/h; Pressures has been stable for hours; Patient continues on 3 L of 02 with sats in the mid 90's; pt has been slightly altered all day and remain in soft restraints with mittens due to pulling out IV and pulling leads off; Patient has pulled Oxygen off several times causing sats to drop to 80s; Patient is not oriented to place at this time; She  is bilateral BKA from home with mother; Morning labs has been drawn and are in process of resulting. Next restraint documentation is 5am-Monique,RN

## 2019-11-30 NOTE — ED Notes (Signed)
Paged CCM to RN [facility]

## 2019-11-30 NOTE — Progress Notes (Signed)
[facility] Progress Note Patient Name: Kathy Phelps DOB: 07-17-1981 MRN: [id number]   Date of Service  11/30/2019  HPI/Events of Note  K+ 2.9, patient is npo and has only peripheral access, she has EDRD and is dialysis dependent.  eICU Interventions  KCL 10 meq iv Q 1 hour (via piv) x 2        Camren Lipsett [place] Mabe 11/30/2019, 6:13 AM

## 2019-11-30 NOTE — ED Notes (Signed)
Patient continues to pull at lines when she wakes; RN continues to have to replace patient back on heart monitor; pt had removed O2 from nose and was de sating in the 24s; RN replaced N/C and advised patient the reasoning for 02; patient did not understand she was in the hospital stating "why do I need oxygen if I'm not in the hospital"-Monique,RN

## 2019-11-30 NOTE — Progress Notes (Signed)
Patient ID: Kathy Phelps, female   DOB: 1980-11-18, 39 y.o.   MRN: [id number]  [facility] Progress Note    Subjective:   Awake and oriented to person and place but slowed mentation   Objective:   BP (!) 147/83 (BP Location: Left Arm)   Pulse 85   Temp (!) 97.4 F (36.3 C) (Oral)   Resp 18   Ht 4\' 1"  (1.245 m) Comment: bilateral BKA  Wt 77.3 kg   SpO2 (!) 86%   BMI 49.90 kg/m   Intake/Output: I/O last 3 completed shifts: In: 16.5 [I.V.:16.5] Out: 30 [Urine:30]   Intake/Output this shift:  Total I/O In: 460.5 [I.V.:48.8; IV Piggyback:411.7] Out: -  Weight change:   Physical Exam: Gen: slowed mentation but in NAD CVS: no rub Resp: cta Abd: +BS, soft, Nt/Nd Ext: no edema s/p bilateral BKA's Dialysis access:  [facility]  Labs: BMET Recent Labs  Lab 11/29/19 0725 11/30/19 0344  NA 137 138  K 2.8* 2.9*  CL 98 100  CO2 25 27  GLUCOSE 143* 77  BUN 5* 6  CREATININE 3.09* 3.68*  ALBUMIN 2.5*  --   CALCIUM 7.5* 7.9*  PHOS  --  3.0   CBC Recent Labs  Lab 11/29/19 0725 11/30/19 0344  WBC 9.0 8.9  NEUTROABS 6.3  --   HGB 7.4* 7.6*  HCT 24.6* 24.5*  MCV 99.2 97.2  PLT 141* 139*      Medications:    . amLODipine  2.5 mg Oral Daily  . Chlorhexidine Gluconate Cloth  6 each Topical Q0600  . heparin  5,000 Units Subcutaneous Q8H  . insulin aspart  0-15 Units Subcutaneous TID WC  . sodium chloride flush  10-40 mL Intracatheter Q12H   Dialysis Orders:  TTS at [place] --> last HD 2/17 3:30hr, 400/600, 2K/2.5Ca, EDW 71kg, Heparin 1000 + 500/hr - Unclear last dose ESA - HBsAg negative 11/01/19  Assessment/ Plan:   1. Seizure- treated with versed and ativan.  Neuro following.  On Keppra 500 mg bid 2. Hypertensive urgency- improved and off all vasopressor agents.  For MRI today to r/o PRES 3. ESRD plan for HD today to keep on her normal schedule.  MS markedly improved 4. AMS- likely post-ictal 5. Anemia: s/p transfusion.   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 Nutrition:renal diet 8. Hypokalemia- will use added K bath 9. T2DM- per primary 10. H/o covid-19 10/2019  Donetta Potts, MD [place] Pager [phone] 11/30/2019, 11:34 AM

## 2019-11-30 NOTE — Progress Notes (Signed)
[facility] Progress Note Patient Name: ELANAH PRESTON DOB: 03-01-1981 MRN: [id number]   Date of Service  11/30/2019  HPI/Events of Note  Patient pulling at her lines.  eICU Interventions  Soft wrist restraints ordered for 24 hours.        Kerry Kass Amahia Madonia 11/30/2019, 1:48 AM

## 2019-11-30 NOTE — Plan of Care (Signed)

## 2019-11-30 NOTE — ED Notes (Signed)
Paged IM to RN [facility]

## 2019-11-30 NOTE — Progress Notes (Signed)
NAME:  ALMA BEITH, MRN:  [id number], DOB:  [birth year], LOS: 1 ADMISSION DATE:  11/29/2019, CONSULTATION DATE:  11/29/19 REFERRING MD:  Dr. Berline Lopes, CHIEF COMPLAINT:  AMS    Brief History   39 y/o F admitted 2/18 with seizure post-ictal, hypertensive.   History of present illness   39 y/o F who presented to [facility] on 2/18 by EMS with reports of seizure.   The patient was reportedly in bed with her daughter when she felt her shaking.  Seizures lasted approximately 2 minutes each.  EMS activated and treated patient with 5mg  IM versed, 2mg  ativan x2 doses. She reportedly completed dialysis 2/17 on schedule.  In the ER, she was hypertensive, altered / agitated.  She was treated with Keppra.  She was initially admitted per IMTS.  CT of the head was concerning for multifocal cortical/sub-cortical hypodensities within the parietooccipital lobes. Neurology was consulted and felt the patient may have PRES.  Initial labs - WBC 9, Hgb 7.4, platelets 141, Na 137, K 2.8, glucose 143, BUN 5, Sr Cr 3.09, AG 14, Mag 1.7, albumin 2.5, lactic acid 5.5 with repeat of 2.3.    PCCM consulted for evaluation of hypertension, AMS.  Past Medical History  Anxiety  Bipolar Depression  Schizophrenia  DM II  ESRD on HD MWF Osteomyelitis s/p bilateral BKA HTN HLD CVA GERD Prior Miscarriages Constipation  COVID - 11/01/19   Significant Hospital Events   2/18 Admit with seizure   Consults:  PCCM  Nephrology  Procedures:  R IJ Perm Cath >>   Significant Diagnostic Tests:   CT Head w/o 2/18 >> multifocal cortical / subcortical abnormal hypodensity within the bilateral parietooccipital lobes.  Small focus within the right parietal lobe suspicious for acute / early subacute infarct.  Age-indeterminate lacunar infarct within the right parietal subcortical white matter  MRI Brain 2/18 >>   Micro Data:  COVID 2/18 >>   Antimicrobials:      Interim history/subjective:  Await no acute distress off oxygen  ready for transition to pcu .  Objective   Blood pressure 135/74, pulse 82, temperature (!) 97.4 F (36.3 C), temperature source Oral, resp. rate 15, height 4\' 1"  (1.245 m), weight 77.3 kg, SpO2 93 %.        Intake/Output Summary (Last 24 hours) at 11/30/2019 0932 Last data filed at 11/30/2019 0800 Gross per 24 hour  Intake 65.31 ml  Output --  Net 65.31 ml   Filed Weights   11/29/19 0738  Weight: 77.3 kg    Examination: General: Somewhat subdued but arouses to voice follows commands becomes easily irritable HEENT: Pupils equal reactive to light.  No JVD lymphadenopathy is appreciated Neuro: Somewhat sleepy but follows commands moves all extremities CV: Heart sounds are regular regular rate rhythm PULM: Diminished in the bases  GI: soft, bsx4 active  Extremities: warm/dry, missing toes on left foot surgically removed Skin: no rashes or lesions   Resolved Hospital Problem list      Assessment & Plan:   Seizure  Rule Out PRES  Two seizures prior to arrival.  Treated with 34m IM versed + 2 doses of 110m IV ativan.  Suspect altered mental status is secondary to post ictal state with benzodiazepine dosing superimposed on ESRD.  CT head with concern for abnormal hypodensity in the cortical / subcortical within the bilateral parietooccipital lobes, ? Old lacunar infarcts. Rule out PRES. Neurology input appreciated Continue frequent neuro examinations Continue Keppra 500 mg twice daily No evidence of  aspiration Okay to transfer out to hospitalist service continue to be monitored by neurology    Hypertensive Urgency  Blood pressure adequately controlled off all vasopressor agents  ESRD on HD  Hypokalemia Hypomagnesemia  Recent Labs  Lab 11/29/19 0725 11/30/19 0344  K 2.8* 2.9*    Nephrology is following Monitor potassium Repleted this a.m. 11/30/2019  DM II  CBG (last 3)  Recent Labs    11/29/19 2311 11/30/19 0439 11/30/19 0800  GLUCAP 76 78 75    Sliding  scale insulin per protocol  Anemia  -trend CBC  Anxiety  Bipolar Depression  Schizophrenia  Resume antipsychotic medications  At Risk Respiratory Failure in setting of Seizure  Hx COVID  11/30/2019 currently on room air with no acute distress  Best practice:  Diet: Advance diet to renal diet Pain/Anxiety/Delirium protocol (if indicated): n/a  VAP protocol (if indicated): n/a  DVT prophylaxis: heparin sq GI prophylaxis: n/a  Glucose control: SSI  Mobility: BR  Code Status: Full Code  Family Communication: 11/30/2019 patient updated at bedside Disposition: ICU to progressive care unit  Labs   CBC: Recent Labs  Lab 11/29/19 0725 11/30/19 0344  WBC 9.0 8.9  NEUTROABS 6.3  --   HGB 7.4* 7.6*  HCT 24.6* 24.5*  MCV 99.2 97.2  PLT 141* 139*    Basic Metabolic Panel: Recent Labs  Lab 11/29/19 0725 11/30/19 0344  NA 137 138  K 2.8* 2.9*  CL 98 100  CO2 25 27  GLUCOSE 143* 77  BUN 5* 6  CREATININE 3.09* 3.68*  CALCIUM 7.5* 7.9*  MG 1.7 2.2  PHOS  --  3.0   GFR: Estimated Creatinine Clearance: 13.8 mL/min (A) (by C-G formula based on SCr of 3.68 mg/dL (H)). Recent Labs  Lab 11/29/19 0725 11/29/19 0908 11/30/19 0344  WBC 9.0  --  8.9  LATICACIDVEN 5.5* 2.3*  --     Liver Function Tests: Recent Labs  Lab 11/29/19 0725  AST 24  ALT 9  ALKPHOS 72  BILITOT 1.0  PROT 5.8*  ALBUMIN 2.5*   No results for input(s): LIPASE, AMYLASE in the last 168 hours. No results for input(s): AMMONIA in the last 168 hours.  ABG    Component Value Date/Time   PHART 7.486 (H) 05/03/2009 0103   PCO2ART 28.0 (L) 05/03/2009 0103   PO2ART 115.0 (H) 05/03/2009 0103   HCO3 24.2 (H) 02/26/2015 1248   TCO2 26 05/22/2016 2313   ACIDBASEDEF 0.3 06/30/2014 1750   O2SAT 87.4 02/26/2015 1248     Coagulation Profile: No results for input(s): INR, PROTIME in the last 168 hours.  Cardiac Enzymes: No results for input(s): CKTOTAL, CKMB, CKMBINDEX, TROPONINI in the last 168  hours.  HbA1C: Hgb A1c MFr Bld  Date/Time Value Ref Range Status  11/01/2019 06:37 AM 5.6 4.8 - 5.6 % Final    Comment:    (NOTE) Pre diabetes:          5.7%-6.4% Diabetes:              >6.4% Glycemic control for   <7.0% adults with diabetes   06/15/2019 03:21 PM 6.4 (H) 4.8 - 5.6 % Final    Comment:    (NOTE) Pre diabetes:          5.7%-6.4% Diabetes:              >6.4% Glycemic control for   <7.0% adults with diabetes     CBG: Recent Labs  Lab 11/29/19 1910 11/29/19 2041  11/29/19 2311 11/30/19 0439 11/30/19 0800  GLUCAP 80 81 76 78 75     Steve Amiliana Foutz ACNP Acute Care Nurse Practitioner [place] Please consult Amion 11/30/2019, 9:32 AM

## 2019-11-30 NOTE — Progress Notes (Signed)
[facility] Progress Note Patient Name: Kathy Phelps DOB: 11-30-1980 MRN: [id number]   Date of Service  11/30/2019  HPI/Events of Note  Pt is a poorly compliant diabetic with ESRD admitted to [facility] yesterday for altered mental status and suspected seizure disorder. She has had intermittent agitation with concern for her pulling out her Vascath and other lines.  eICU Interventions  New Patient Evaluation completed. Restraints were renewed for 24 hours pending return to her baseline neurologic status.        Kerry Kass Moraima Burd 11/30/2019, 5:43 AM

## 2019-12-01 ENCOUNTER — Encounter (HOSPITAL_COMMUNITY): Payer: Medicare Other

## 2019-12-01 ENCOUNTER — Inpatient Hospital Stay (HOSPITAL_COMMUNITY): Payer: Medicare Other

## 2019-12-01 ENCOUNTER — Other Ambulatory Visit (HOSPITAL_COMMUNITY): Payer: Medicare Other

## 2019-12-01 ENCOUNTER — Other Ambulatory Visit: Payer: Self-pay

## 2019-12-01 DIAGNOSIS — Z5329 Procedure and treatment not carried out because of patient's decision for other reasons: Secondary | ICD-10-CM

## 2019-12-01 LAB — LIPID PANEL
Cholesterol: 91 mg/dL (ref 0–200)
HDL: 35 mg/dL — ABNORMAL LOW (ref >40)
LDL Cholesterol: 33 mg/dL (ref 0–99)
Total CHOL/HDL Ratio: 2.6 RATIO
Triglycerides: 114 mg/dL (ref <150)
VLDL: 23 mg/dL (ref 0–40)

## 2019-12-01 LAB — HEMOGLOBIN A1C
Hgb A1c MFr Bld: 5.2 % (ref 4.8–5.6)
Mean Plasma Glucose: 102.54 mg/dL

## 2019-12-01 LAB — RENAL FUNCTION PANEL
Albumin: 2.1 g/dL — ABNORMAL LOW (ref 3.5–5.0)
Anion gap: 10 (ref 5–15)
BUN: <5 mg/dL — ABNORMAL LOW (ref 6–20)
CO2: 28 mmol/L (ref 22–32)
Calcium: 7.5 mg/dL — ABNORMAL LOW (ref 8.9–10.3)
Chloride: 100 mmol/L (ref 98–111)
Creatinine, Ser: 2.84 mg/dL — ABNORMAL HIGH (ref 0.44–1.00)
GFR calc Af Amer: 23 mL/min — ABNORMAL LOW (ref >60)
GFR calc non Af Amer: 20 mL/min — ABNORMAL LOW (ref >60)
Glucose, Bld: 76 mg/dL (ref 70–99)
Phosphorus: 2.3 mg/dL — ABNORMAL LOW (ref 2.5–4.6)
Potassium: 3.3 mmol/L — ABNORMAL LOW (ref 3.5–5.1)
Sodium: 138 mmol/L (ref 135–145)

## 2019-12-01 LAB — PHOSPHORUS: Phosphorus: 2.3 mg/dL — ABNORMAL LOW (ref 2.5–4.6)

## 2019-12-01 LAB — BASIC METABOLIC PANEL
Anion gap: 10 (ref 5–15)
BUN: <5 mg/dL — ABNORMAL LOW (ref 6–20)
CO2: 27 mmol/L (ref 22–32)
Calcium: 7.6 mg/dL — ABNORMAL LOW (ref 8.9–10.3)
Chloride: 101 mmol/L (ref 98–111)
Creatinine, Ser: 2.76 mg/dL — ABNORMAL HIGH (ref 0.44–1.00)
GFR calc Af Amer: 24 mL/min — ABNORMAL LOW (ref >60)
GFR calc non Af Amer: 21 mL/min — ABNORMAL LOW (ref >60)
Glucose, Bld: 74 mg/dL (ref 70–99)
Potassium: 3.3 mmol/L — ABNORMAL LOW (ref 3.5–5.1)
Sodium: 138 mmol/L (ref 135–145)

## 2019-12-01 LAB — GLUCOSE, CAPILLARY
Glucose-Capillary: 74 mg/dL (ref 70–99)
Glucose-Capillary: 64 mg/dL — ABNORMAL LOW (ref 70–99)
Glucose-Capillary: 83 mg/dL (ref 70–99)

## 2019-12-01 LAB — CBC
HCT: 24.2 % — ABNORMAL LOW (ref 36.0–46.0)
Hemoglobin: 7.7 g/dL — ABNORMAL LOW (ref 12.0–15.0)
MCH: 29.7 pg (ref 26.0–34.0)
MCHC: 31.8 g/dL (ref 30.0–36.0)
MCV: 93.4 fL (ref 80.0–100.0)
Platelets: 124 10*3/uL — ABNORMAL LOW (ref 150–400)
RBC: 2.59 MIL/uL — ABNORMAL LOW (ref 3.87–5.11)
RDW: 15.9 % — ABNORMAL HIGH (ref 11.5–15.5)
WBC: 7.7 10*3/uL (ref 4.0–10.5)
nRBC: 0 % (ref 0.0–0.2)

## 2019-12-01 LAB — VITAMIN B12: Vitamin B-12: 575 pg/mL (ref 180–914)

## 2019-12-01 LAB — MAGNESIUM: Magnesium: 2 mg/dL (ref 1.7–2.4)

## 2019-12-01 LAB — TSH: TSH: 3.407 u[IU]/mL (ref 0.350–4.500)

## 2019-12-01 NOTE — Procedures (Signed)
Echocardiogram attempted. Patient refused test.

## 2019-12-01 NOTE — Plan of Care (Signed)
Pt not in room when I trying to see her. I was told that pt wheeled herself to front desk to sign AMA. I met Dr. Neysa Bonito in the hallway and he is also looking for the pt. Later I learned that pt signed AMA. So far stroke work up has not been completed and she will need to call office for follow up in clinic if she desire.   Rosalin Hawking, MD PhD Stroke Neurology 12/01/2019 1:08 PM

## 2019-12-01 NOTE — Progress Notes (Signed)
Patient ID: Kathy Phelps, female   DOB: 21-Oct-1980, 39 y.o.   MRN: [id number] S: more awake and alert today, oriented to person and place.  Still a little slowed mentation. O:BP (!) 165/89 (BP Location: Right Arm)   Pulse 83   Temp 97.8 F (36.6 C) (Oral)   Resp 12   Ht 4\' 1"  (1.245 m) Comment: bilateral BKA  Wt (!) 169.7 kg   SpO2 99%   BMI 109.55 kg/m   Intake/Output Summary (Last 24 hours) at 12/01/2019 1051 Last data filed at 11/30/2019 1800 Gross per 24 hour  Intake 11.31 ml  Output 1100 ml  Net -1088.69 ml   Intake/Output: I/O last 3 completed shifts: In: 477 [I.V.:65.3; IV Piggyback:411.7] Out: 1100 [Urine:100; Other:1000]  Intake/Output this shift:  No intake/output data recorded. Weight change: 0.7 kg Gen:NAD CVS: no rub Resp: cta Abd: benign Ext: s/p bilateral BKA's, no edema  Recent Labs  Lab 11/29/19 0725 11/30/19 0344 11/30/19 1400 12/01/19 0247  NA 137 138 136 138  138  K 2.8* 2.9* 3.1* 3.3*  3.3*  CL 98 100 101 101  100  CO2 25 27 26 27  28   GLUCOSE 143* 77 88 74  76  BUN 5* 6 6 <5*  <5*  CREATININE 3.09* 3.68* 4.08* 2.76*  2.84*  ALBUMIN 2.5*  --  2.1* 2.1*  CALCIUM 7.5* 7.9* 7.6* 7.6*  7.5*  PHOS  --  3.0 2.9 2.3*  2.3*  AST 24  --   --   --   ALT 9  --   --   --    Liver Function Tests: Recent Labs  Lab 11/29/19 0725 11/30/19 1400 12/01/19 0247  AST 24  --   --   ALT 9  --   --   ALKPHOS 72  --   --   BILITOT 1.0  --   --   PROT 5.8*  --   --   ALBUMIN 2.5* 2.1* 2.1*   No results for input(s): LIPASE, AMYLASE in the last 168 hours. No results for input(s): AMMONIA in the last 168 hours. CBC: Recent Labs  Lab 11/29/19 0725 11/29/19 0725 11/30/19 0344 11/30/19 1400 12/01/19 0247  WBC 9.0   < > 8.9 8.4 7.7  NEUTROABS 6.3  --   --   --   --   HGB 7.4*   < > 7.6* 7.5* 7.7*  HCT 24.6*   < > 24.5* 24.1* 24.2*  MCV 99.2  --  97.2 97.6 93.4  PLT 141*   < > 139* 148* 124*   < > = values in this interval not displayed.    Cardiac Enzymes: No results for input(s): CKTOTAL, CKMB, CKMBINDEX, TROPONINI in the last 168 hours. CBG: Recent Labs  Lab 11/30/19 2021 11/30/19 2347 12/01/19 0421 12/01/19 0802 12/01/19 0953  GLUCAP 116* 69* 74 64* 83    Iron Studies: No results for input(s): IRON, TIBC, TRANSFERRIN, FERRITIN in the last 72 hours. Studies/Results: EEG  Result Date: 11/29/2019 Kathy Havens, MD     11/29/2019  1:49 PM Patient Name: Kathy Phelps MRN: [id number] Epilepsy Attending: Lora Phelps Referring Physician/Provider: Dr. Zeb Comfort Date: 11/29/2019 Duration: 24.45 minutes Patient history: 39 year old female presented with multiple generalized tonic-clonic seizures, continues to be altered.  EEG to evaluate for subclinical status epilepticus. Level of alertness: Lethargic, asleep AEDs during EEG study: Ativan, Keppra Technical aspects: This EEG study was done with scalp electrodes positioned according to the 10-20 International  system of electrode placement. Electrical activity was acquired at a sampling rate of 500Hz  and reviewed with a high frequency filter of 70Hz  and a low frequency filter of 1Hz . EEG data were recorded continuously and digitally stored. Description: During awake state, no clear posterior dominant was seen.  EEG showed generalized 5-6 hertz theta slowing.  Sleep was characterized by vertex waves, sleep spindles (12 to 15 Hz), maximal frontocentral.  Hyperventilation and photic stimulation were not performed. Abnormality -Continuous slow, generalized IMPRESSION: This study is suggestive of moderate diffuse encephalopathy, nonspecific to etiology but could be secondary to postictal state.  No seizures or epileptiform discharges were seen throughout the recording. Kathy Phelps   MR BRAIN WO CONTRAST  Result Date: 11/30/2019 CLINICAL DATA:  Seizures. Abnormal neuro exam. EXAM: MRI HEAD WITHOUT CONTRAST TECHNIQUE: Multiplanar, multiecho pulse sequences of the brain and  surrounding structures were obtained without intravenous contrast. COMPARISON:  Head CT November 29, 2019 FINDINGS: Brain: Focus of restricted diffusion is noted in the deep white matter of the left frontal lobe and in the left cerebellar hemisphere, consistent with acute infarcts. There also multiple foci with increased signal on DWI and FLAIR within the centrum semiovale, corona radiata and subcortical white matter bilaterally, however, without corresponding low signal on the ADC map, may represent subacute infarcts. Small old infarcts are noted in the bilateral parietooccipital regions. Pattern is suggestive of watershed infarcts. Some of the infarct show susceptibility artifact on the SWAN sequence, consistent with hemosiderin deposits. Vascular: Normal flow voids. Skull and upper cervical spine: Normal marrow signal. Sinuses/Orbits: Negative. Other: None. IMPRESSION: 1. Acute infarcts in the deep white matter of the left frontal lobe and left cerebellar hemisphere. 2. Multiple foci of increased signal on DWI and FLAIR within the centrum semiovale, corona radiata and subcortical white matter bilaterally, without corresponding low signal on the ADC map, may represent subacute infarcts in watershed zones. 3. 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 Electronically Signed   By: Pedro Earls M.D.   On: 11/30/2019 12:34   DG Chest Port 1 View  Result Date: 12/01/2019 CLINICAL DATA:  Respiratory failure EXAM: PORTABLE CHEST 1 VIEW COMPARISON:  Yesterday FINDINGS: Layering pleural effusions that are at least moderate, more inferiorly accumulated when compared yesterday. Dialysis catheter on the right with tip at the right atrium. Borderline heart size. IMPRESSION: At least moderate layering pleural effusions that have increased from recent priors. Electronically Signed   By: Monte Fantasia M.D.   On: 12/01/2019 07:29   DG Chest Port 1 View  Result Date: 11/30/2019 CLINICAL  DATA:  Seizure. EXAM: PORTABLE CHEST 1 VIEW COMPARISON:  11/29/2019. FINDINGS: Dual-lumen catheter tip in unchanged position over the right atrium. Cardiomegaly with bilateral pulmonary infiltrates/edema and bilateral pleural effusions. Findings most consistent with CHF. Findings have progressed from prior exam. No pneumothorax. IMPRESSION: 1. Dual-lumen catheter tip in unchanged position over the right atrium. 2. Cardiomegaly with bilateral pulmonary infiltrates/edema bilateral pleural effusions. Findings most consistent CHF. Findings have progressed from prior exam. Electronically Signed   By: Marcello Moores  Register   On: 11/30/2019 06:56   DG Chest Port 1 View  Result Date: 11/29/2019 CLINICAL DATA:  Altered mental status EXAM: PORTABLE CHEST 1 VIEW COMPARISON:  October 31, 2019 FINDINGS: Central catheter tip is in the right atrium. No pneumothorax. There are pleural effusions bilaterally. There is consolidation in the left lower lobe and medial right base regions. Heart is enlarged with pulmonary vascularity normal. No adenopathy. No bone lesions.  IMPRESSION: Pleural effusions bilaterally. Consolidation left lower lobe and medial right base. Cardiomegaly. Central catheter tip in right atrium without pneumothorax. No adenopathy. Electronically Signed   By: Lowella Grip III M.D.   On: 11/29/2019 14:09   . amLODipine  2.5 mg Oral Daily  . aspirin  81 mg Oral Daily  . atorvastatin  40 mg Oral q1800  . Chlorhexidine Gluconate Cloth  6 each Topical Q0600  . feeding supplement (NEPRO CARB STEADY)  237 mL Oral BID BM  . heparin  5,000 Units Subcutaneous Q8H  . insulin aspart  0-15 Units Subcutaneous TID WC  . sodium chloride flush  10-40 mL Intracatheter Q12H    BMET    Component Value Date/Time   NA 138 12/01/2019 0247   NA 138 12/01/2019 0247   K 3.3 (L) 12/01/2019 0247   K 3.3 (L) 12/01/2019 0247   CL 101 12/01/2019 0247   CL 100 12/01/2019 0247   CO2 27 12/01/2019 0247   CO2 28 12/01/2019  0247   GLUCOSE 74 12/01/2019 0247   GLUCOSE 76 12/01/2019 0247   BUN <5 (L) 12/01/2019 0247   BUN <5 (L) 12/01/2019 0247   CREATININE 2.76 (H) 12/01/2019 0247   CREATININE 2.84 (H) 12/01/2019 0247   CALCIUM 7.6 (L) 12/01/2019 0247   CALCIUM 7.5 (L) 12/01/2019 0247   CALCIUM NOPLAS 06/16/2019 0611   GFRNONAA 21 (L) 12/01/2019 0247   GFRNONAA 20 (L) 12/01/2019 0247   GFRAA 24 (L) 12/01/2019 0247   GFRAA 23 (L) 12/01/2019 0247   CBC    Component Value Date/Time   WBC 7.7 12/01/2019 0247   RBC 2.59 (L) 12/01/2019 0247   HGB 7.7 (L) 12/01/2019 0247   HCT 24.2 (L) 12/01/2019 0247   PLT 124 (L) 12/01/2019 0247   MCV 93.4 12/01/2019 0247   MCH 29.7 12/01/2019 0247   MCHC 31.8 12/01/2019 0247   RDW 15.9 (H) 12/01/2019 0247   LYMPHSABS 1.1 11/29/2019 0725   MONOABS 0.5 11/29/2019 0725   EOSABS 1.0 (H) 11/29/2019 0725   BASOSABS 0.1 11/29/2019 0725   Dialysis Orders: TTS at [place] --> last HD 2/17 3:30hr, 400/600, 2K/2.5Ca, EDW 71kg, Heparin 1000 + 500/hr - Unclear last dose ESA - HBsAg negative 11/01/19  Assessment/Plan: 1. Seizure- treated with versed and ativan.  Neuro following.  On Keppra 500 mg bid 2. Hypertensive urgency- improved.   3. ESRD Off schedule.  She had HD yesterday and will plan for HD again on Monday and get back on schedule Thursday. MS markedly improved 4. AMS- MRI showed acute infarcts in deep white matter of left frontal lobe and left cerebellar hemisphere.  Also with possible subacute infarcts in watershed zones. 1. Neuro following and ordered CTA of head and neck as well as LE dopplers to r/o DVT 2. Keppra for seizure precautions 5. Anemia: s/p transfusion.  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 Nutrition:renal diet 8. Hypokalemia- will use added K bath 9. T2DM- per primary 10. H/o covid-19 10/2019  Donetta Potts, MD [employer] [phone]

## 2019-12-01 NOTE — Discharge Summary (Addendum)
AMA NOTE    Patient ID: Kathy Phelps MRN: [id number] DOB/AGE: 1981/06/05 39 y.o. Primary Care Physician:  Davonna Belling, MD  Admit date: 11/29/2019 Discharge date: 12/01/2019   PLEASE NOTE THAT PATIENT LEFT AGAINST MEDICAL ADVICE. Risks of recurrent stroke, seizure, or death were explained in detail to the patient and he/she verbally understood the risks of leaving AMA.   Discharge Diagnoses:   Present on Admission:  Essential hypertension  Anxiety   Brief H and P: For complete details please refer to admission H and P, but in brief  39 y/o F who presented to [facility] on 2/18 by EMS with reports of seizure.    The patient was reportedly in bed with her daughter when she felt her shaking.  Seizures lasted approximately 2 minutes each.  EMS activated and treated patient with 5mg  IM versed, 2mg  ativan x2 doses. She reportedly completed dialysis 2/17 on schedule.  In the ER, she was hypertensive, altered / agitated.  She was treated with Keppra.  She was initially admitted per IMTS.  CT of the head was concerning for multifocal cortical/sub-cortical hypodensities within the parietooccipital lobes. Neurology was consulted and felt the patient may have PRES.  Initial labs - WBC 9, Hgb 7.4, platelets 141, Na 137, K 2.8, glucose 143, BUN 5, Sr Cr 3.09, AG 14, Mag 1.7, albumin 2.5, lactic acid 5.5 with repeat of 2.3.     PCCM consulted for evaluation of hypertension, AMS. She was admitted to the ICU Nephro consulted for ESRD on HD  MRI Brain:  1. Acute infarcts in the deep white matter of the left frontal lobe and left cerebellar hemisphere. 2. Multiple foci of increased signal on DWI and FLAIR within the centrum semiovale, corona radiata and subcortical white matter bilaterally, without corresponding low signal on the ADC map, may represent subacute infarcts in watershed zones. 3. 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  Patient left AMA on 2/20 before full neuro workup.   - morbid obesity - to follow up outpatient.  Consults:  pulmonary/intensive care, nephrology and neurology   Recommendations for Outpatient Follow-up:  follow up with neuro for complete workup  Allergies:   Allergies  Allergen Reactions   Hydrocodone-Acetaminophen Hives   Tylenol [Acetaminophen] Itching and Swelling    Patient tolerated APAP 650 mg (06/29/18) as well as oxycodone/APAP 5-325 mg during 06/25/2018 admission ??    Discharge Medications: Please note that patient left AMA (against medical advice)   Hospital Course:  Principal Problem:   Seizure [facility]) Active Problems:   Essential hypertension   Anxiety   ESRD on hemodialysis ([place])   Day of Discharge BP (!) 165/89 (BP Location: Right Arm)   Pulse 83   Temp 97.8 F (36.6 C) (Oral)   Resp 12   Ht 4\' 1"  (1.245 m) Comment: bilateral BKA  Wt (!) 169.7 kg   SpO2 99%   BMI 109.55 kg/m   Physical Exam: Physical Exam Vitals and nursing note reviewed.  Constitutional:      Appearance: Normal appearance.  HENT:     Head: Normocephalic and atraumatic.  Eyes:     Conjunctiva/sclera: Conjunctivae normal.  Cardiovascular:     Rate and Rhythm: Normal rate and regular rhythm.  Pulmonary:     Effort: Pulmonary effort is normal.     Breath sounds: Normal breath sounds.  Abdominal:     General: Abdomen is flat.     Palpations: Abdomen  is soft.  Musculoskeletal:     Comments: Bilateral BKA  Neurological:     Mental Status: She is alert and oriented to person, place, and time.      The results of significant diagnostics from this hospitalization (including imaging, microbiology, ancillary and laboratory) are listed below for reference.    LAB RESULTS: Basic Metabolic Panel: Recent Labs  Lab 11/30/19 0344 11/30/19 1400 12/01/19 0247  NA  --  136 138  138  K  --  3.1* 3.3*  3.3*  CL  --  101 101  100  CO2  --  26 27  28   GLUCOSE  --  88 74  76   BUN  --  6 <5*  <5*  CREATININE  --  4.08* 2.76*  2.84*  CALCIUM  --  7.6* 7.6*  7.5*  MG   < >  --  2.0  PHOS   < > 2.9 2.3*  2.3*   < > = values in this interval not displayed.   Liver Function Tests: Recent Labs  Lab 11/29/19 0725 11/29/19 0725 11/30/19 1400 12/01/19 0247  AST 24  --   --   --   ALT 9  --   --   --   ALKPHOS 72  --   --   --   BILITOT 1.0  --   --   --   PROT 5.8*  --   --   --   ALBUMIN 2.5*   < > 2.1* 2.1*   < > = values in this interval not displayed.   No results for input(s): LIPASE, AMYLASE in the last 168 hours. No results for input(s): AMMONIA in the last 168 hours. CBC: Recent Labs  Lab 11/29/19 0725 11/30/19 0344 11/30/19 1400 11/30/19 1400 12/01/19 0247  WBC 9.0   < > 8.4  --  7.7  NEUTROABS 6.3  --   --   --   --   HGB 7.4*   < > 7.5*  --  7.7*  HCT 24.6*   < > 24.1*  --  24.2*  MCV 99.2   < > 97.6   < > 93.4  PLT 141*   < > 148*  --  124*   < > = values in this interval not displayed.   Cardiac Enzymes: No results for input(s): CKTOTAL, CKMB, CKMBINDEX, TROPONINI in the last 168 hours. BNP: Invalid input(s): POCBNP CBG: Recent Labs  Lab 12/01/19 0802 12/01/19 0953  GLUCAP 64* 83    Significant Diagnostic Studies:  EEG  Result Date: 11/29/2019 Lora Havens, MD     11/29/2019  1:49 PM Patient Name: Kathy Phelps MRN: [id number] Epilepsy Attending: Lora Havens Referring Physician/Provider: Dr. Zeb Comfort Date: 11/29/2019 Duration: 24.45 minutes Patient history: 39 year old female presented with multiple generalized tonic-clonic seizures, continues to be altered.  EEG to evaluate for subclinical status epilepticus. Level of alertness: Lethargic, asleep AEDs during EEG study: Ativan, Keppra Technical aspects: This EEG study was done with scalp electrodes positioned according to the 10-20 International system of electrode placement. Electrical activity was acquired at a sampling rate of 500Hz  and reviewed with a high  frequency filter of 70Hz  and a low frequency filter of 1Hz . EEG data were recorded continuously and digitally stored. Description: During awake state, no clear posterior dominant was seen.  EEG showed generalized 5-6 hertz theta slowing.  Sleep was characterized by vertex waves, sleep spindles (12 to 15 Hz), maximal frontocentral.  Hyperventilation  and photic stimulation were not performed. Abnormality -Continuous slow, generalized IMPRESSION: This study is suggestive of moderate diffuse encephalopathy, nonspecific to etiology but could be secondary to postictal state.  No seizures or epileptiform discharges were seen throughout the recording. Lora Havens   CT Head Wo Contrast  Result Date: 11/29/2019 CLINICAL DATA:  Seizure, abnormal neuro exam. Additional history provided: Patient with 2 witnessed seizures this morning, unresponsive on arrival. EXAM: CT HEAD WITHOUT CONTRAST TECHNIQUE: Contiguous axial images were obtained from the base of the skull through the vertex without intravenous contrast. COMPARISON:  Head CT 07/16/2014 FINDINGS: Brain: There is a small focus of abnormal hypodensity within the cortical/subcortical right parietal lobe (series 3, image 22) (series 6, image 21). Findings are suspicious for acute/early subacute infarct. Multiple small foci of abnormal hypodensity within the subcortical white matter and possibly cortex of the left parietooccipital lobe (series 3, image 20)(series 5, images 53-58) (series 5, image 58). Findings may be post ischemic in etiology, age indeterminate. There is a more circumscribed focus of abnormal hypodensity within the right occipital lobe white matter (series 3, image 13). This is suspected related to a more remote insult, possibly post ischemic. Tiny circumscribed hypodensity within the right parietal subcortical white matter consistent with age-indeterminate lacunar infarct (series 3, image 23) (series 5, image 47). There is no evidence of acute  intracranial hemorrhage. No midline shift or extra-axial fluid collection. No ventriculomegaly. Cerebral volume is normal for age. Vascular: No hyperdense vessel. Skull: Normal. Negative for fracture or focal lesion. Sinuses/Orbits: Visualized orbits demonstrate no acute abnormality. Small left maxillary sinus mucous retention cyst. No significant mastoid effusion. Other: Numerous carious teeth. IMPRESSION: Multifocal cortical/subcortical abnormal hypodensity within the bilateral parietooccipital lobes. A small focus within the right parietal lobe is suspicious for acute/early subacute infarct. Age-indeterminate lacunar infarct within the right parietal subcortical white matter. The additional foci may also be post ischemic, some age-indeterminate and some appearing more remote. Brain MRI is recommended for further evaluation and to exclude alternative etiologies (including posterior reversible encephalopathy syndrome given provided history of seizure). Electronically Signed   By: Kellie Simmering DO   On: 11/29/2019 08:42   MR BRAIN WO CONTRAST  Result Date: 11/30/2019 CLINICAL DATA:  Seizures. Abnormal neuro exam. EXAM: MRI HEAD WITHOUT CONTRAST TECHNIQUE: Multiplanar, multiecho pulse sequences of the brain and surrounding structures were obtained without intravenous contrast. COMPARISON:  Head CT November 29, 2019 FINDINGS: Brain: Focus of restricted diffusion is noted in the deep white matter of the left frontal lobe and in the left cerebellar hemisphere, consistent with acute infarcts. There also multiple foci with increased signal on DWI and FLAIR within the centrum semiovale, corona radiata and subcortical white matter bilaterally, however, without corresponding low signal on the ADC map, may represent subacute infarcts. Small old infarcts are noted in the bilateral parietooccipital regions. Pattern is suggestive of watershed infarcts. Some of the infarct show susceptibility artifact on the SWAN sequence,  consistent with hemosiderin deposits. Vascular: Normal flow voids. Skull and upper cervical spine: Normal marrow signal. Sinuses/Orbits: Negative. Other: None. IMPRESSION: 1. Acute infarcts in the deep white matter of the left frontal lobe and left cerebellar hemisphere. 2. Multiple foci of increased signal on DWI and FLAIR within the centrum semiovale, corona radiata and subcortical white matter bilaterally, without corresponding low signal on the ADC map, may represent subacute infarcts in watershed zones. 3. 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 Electronically Signed   By: Sandre Kitty.D.  On: 11/30/2019 12:34   DG Chest Port 1 View  Result Date: 11/30/2019 CLINICAL DATA:  Seizure. EXAM: PORTABLE CHEST 1 VIEW COMPARISON:  11/29/2019. FINDINGS: Dual-lumen catheter tip in unchanged position over the right atrium. Cardiomegaly with bilateral pulmonary infiltrates/edema and bilateral pleural effusions. Findings most consistent with CHF. Findings have progressed from prior exam. No pneumothorax. IMPRESSION: 1. Dual-lumen catheter tip in unchanged position over the right atrium. 2. Cardiomegaly with bilateral pulmonary infiltrates/edema bilateral pleural effusions. Findings most consistent CHF. Findings have progressed from prior exam. Electronically Signed   By: Marcello Moores  Register   On: 11/30/2019 06:56   DG Chest Port 1 View  Result Date: 11/29/2019 CLINICAL DATA:  Altered mental status EXAM: PORTABLE CHEST 1 VIEW COMPARISON:  October 31, 2019 FINDINGS: Central catheter tip is in the right atrium. No pneumothorax. There are pleural effusions bilaterally. There is consolidation in the left lower lobe and medial right base regions. Heart is enlarged with pulmonary vascularity normal. No adenopathy. No bone lesions. IMPRESSION: Pleural effusions bilaterally. Consolidation left lower lobe and medial right base. Cardiomegaly. Central catheter tip in right atrium without  pneumothorax. No adenopathy. Electronically Signed   By: Lowella Grip III M.D.   On: 11/29/2019 14:09    2D ECHO:   Disposition and Follow-up:     DISPOSITION: Patient left AMA. He/ she was advised to seek follow-up with primary care physician.     DISCHARGE FOLLOW-UP     Signed:   Harold Hedge D.O. [facility] 12/01/2019, 12:29 PM

## 2019-12-02 LAB — MPO/PR-3 (ANCA) ANTIBODIES
ANCA Proteinase 3: <3.5 U/mL (ref 0.0–3.5)
Myeloperoxidase Abs: <9 U/mL (ref 0.0–9.0)

## 2019-12-02 LAB — BETA-2-GLYCOPROTEIN I ABS, IGG/M/A
Beta-2 Glyco I IgG: <9 GPI IgG units (ref 0–20)
Beta-2-Glycoprotein I IgA: <9 GPI IgA units (ref 0–25)
Beta-2-Glycoprotein I IgM: <9 GPI IgM units (ref 0–32)

## 2019-12-02 LAB — CARDIOLIPIN ANTIBODIES, IGG, IGM, IGA
Anticardiolipin IgA: <9 APL U/mL (ref 0–11)
Anticardiolipin IgG: <9 GPL U/mL (ref 0–14)
Anticardiolipin IgM: 13 MPL U/mL — ABNORMAL HIGH (ref 0–12)

## 2019-12-03 LAB — LUPUS ANTICOAGULANT PANEL
DRVVT: 39.7 s (ref 0.0–47.0)
PTT Lupus Anticoagulant: 36.5 s (ref 0.0–51.9)

## 2019-12-03 LAB — HOMOCYSTEINE: Homocysteine: 18.9 umol/L — ABNORMAL HIGH (ref 0.0–14.5)

## 2019-12-03 LAB — ANTINUCLEAR ANTIBODIES, IFA: ANA Ab, IFA: NEGATIVE

## 2019-12-04 LAB — ANCA TITERS
Atypical P-ANCA titer: <1:20 {titer}
C-ANCA: <1:20 {titer}
P-ANCA: <1:20 {titer}

## 2019-12-06 ENCOUNTER — Other Ambulatory Visit: Payer: Self-pay

## 2019-12-06 DIAGNOSIS — N184 Chronic kidney disease, stage 4 (severe): Secondary | ICD-10-CM

## 2019-12-07 ENCOUNTER — Inpatient Hospital Stay (HOSPITAL_COMMUNITY): Admission: RE | Admit: 2019-12-07 | Payer: Medicare Other | Source: Ambulatory Visit

## 2019-12-07 ENCOUNTER — Other Ambulatory Visit (HOSPITAL_COMMUNITY): Payer: Medicare Other

## 2019-12-07 ENCOUNTER — Ambulatory Visit: Payer: Medicare Other | Admitting: Vascular Surgery

## 2019-12-28 ENCOUNTER — Ambulatory Visit: Payer: Medicare Other | Admitting: Family Medicine

## 2020-01-02 ENCOUNTER — Ambulatory Visit: Payer: Medicare Other | Admitting: Primary Care

## 2020-01-04 ENCOUNTER — Encounter: Payer: Self-pay | Admitting: Emergency Medicine

## 2020-01-04 ENCOUNTER — Emergency Department
Admission: EM | Admit: 2020-01-04 | Discharge: 2020-01-04 | Disposition: A | Discharge status: Home / Self Care | Payer: Medicare Other | Attending: Emergency Medicine | Admitting: Emergency Medicine

## 2020-01-04 ENCOUNTER — Emergency Department: Payer: Medicare Other

## 2020-01-04 ENCOUNTER — Other Ambulatory Visit: Payer: Self-pay

## 2020-01-04 DIAGNOSIS — R0789 Other chest pain: Secondary | ICD-10-CM | POA: Insufficient documentation

## 2020-01-04 DIAGNOSIS — R0602 Shortness of breath: Secondary | ICD-10-CM | POA: Diagnosis present

## 2020-01-04 DIAGNOSIS — Z5321 Procedure and treatment not carried out due to patient leaving prior to being seen by health care provider: Secondary | ICD-10-CM | POA: Diagnosis not present

## 2020-01-04 LAB — BASIC METABOLIC PANEL
Anion gap: 11 (ref 5–15)
BUN: 33 mg/dL — ABNORMAL HIGH (ref 6–20)
CO2: 22 mmol/L (ref 22–32)
Calcium: 7.8 mg/dL — ABNORMAL LOW (ref 8.9–10.3)
Chloride: 88 mmol/L — ABNORMAL LOW (ref 98–111)
Creatinine, Ser: 5.52 mg/dL — ABNORMAL HIGH (ref 0.44–1.00)
GFR calc Af Amer: 10 mL/min — ABNORMAL LOW (ref >60)
GFR calc non Af Amer: 9 mL/min — ABNORMAL LOW (ref >60)
Glucose, Bld: 176 mg/dL — ABNORMAL HIGH (ref 70–99)
Potassium: 5.3 mmol/L — ABNORMAL HIGH (ref 3.5–5.1)
Sodium: 121 mmol/L — ABNORMAL LOW (ref 135–145)

## 2020-01-04 LAB — CBC
HCT: 24.7 % — ABNORMAL LOW (ref 36.0–46.0)
Hemoglobin: 8.3 g/dL — ABNORMAL LOW (ref 12.0–15.0)
MCH: 30.1 pg (ref 26.0–34.0)
MCHC: 33.6 g/dL (ref 30.0–36.0)
MCV: 89.5 fL (ref 80.0–100.0)
Platelets: 175 10*3/uL (ref 150–400)
RBC: 2.76 MIL/uL — ABNORMAL LOW (ref 3.87–5.11)
RDW: 15.9 % — ABNORMAL HIGH (ref 11.5–15.5)
WBC: 7.8 10*3/uL (ref 4.0–10.5)
nRBC: 0 % (ref 0.0–0.2)

## 2020-01-04 LAB — TROPONIN I (HIGH SENSITIVITY): Troponin I (High Sensitivity): 20 ng/L — ABNORMAL HIGH (ref <18)

## 2020-01-04 NOTE — ED Notes (Signed)
Pt not found in lobby or surrounding areas when called.

## 2020-01-04 NOTE — ED Triage Notes (Signed)
Pt here for SHOB/chest tightness. Missed dialysis Wednesday and they cannot do today.  Pt has permcath to right chest wall that does not appear sutured in.  Catheter has come out some and they were unable to perform tx today.  Tightness is across whole chest and worse when breathing in.  Unlabored at this time, VSS

## 2020-01-27 ENCOUNTER — Encounter (HOSPITAL_COMMUNITY): Payer: Self-pay | Admitting: Emergency Medicine

## 2020-01-27 ENCOUNTER — Emergency Department (HOSPITAL_COMMUNITY)
Admission: EM | Admit: 2020-01-27 | Discharge: 2020-01-27 | Discharge status: Against Medical Advice | Payer: Medicare Other | Attending: Emergency Medicine | Admitting: Emergency Medicine

## 2020-01-27 ENCOUNTER — Other Ambulatory Visit: Payer: Self-pay

## 2020-01-27 ENCOUNTER — Emergency Department (HOSPITAL_COMMUNITY): Payer: Medicare Other

## 2020-01-27 DIAGNOSIS — R0602 Shortness of breath: Secondary | ICD-10-CM | POA: Insufficient documentation

## 2020-01-27 DIAGNOSIS — E872 Acidosis, unspecified: Secondary | ICD-10-CM

## 2020-01-27 DIAGNOSIS — E875 Hyperkalemia: Secondary | ICD-10-CM | POA: Diagnosis not present

## 2020-01-27 DIAGNOSIS — Z992 Dependence on renal dialysis: Secondary | ICD-10-CM | POA: Diagnosis not present

## 2020-01-27 DIAGNOSIS — F17210 Nicotine dependence, cigarettes, uncomplicated: Secondary | ICD-10-CM | POA: Diagnosis not present

## 2020-01-27 DIAGNOSIS — Z89512 Acquired absence of left leg below knee: Secondary | ICD-10-CM | POA: Diagnosis not present

## 2020-01-27 DIAGNOSIS — Z89511 Acquired absence of right leg below knee: Secondary | ICD-10-CM | POA: Diagnosis not present

## 2020-01-27 DIAGNOSIS — I120 Hypertensive chronic kidney disease with stage 5 chronic kidney disease or end stage renal disease: Secondary | ICD-10-CM | POA: Insufficient documentation

## 2020-01-27 DIAGNOSIS — N186 End stage renal disease: Secondary | ICD-10-CM | POA: Diagnosis not present

## 2020-01-27 DIAGNOSIS — J90 Pleural effusion, not elsewhere classified: Secondary | ICD-10-CM

## 2020-01-27 DIAGNOSIS — Z79899 Other long term (current) drug therapy: Secondary | ICD-10-CM | POA: Diagnosis not present

## 2020-01-27 DIAGNOSIS — E1122 Type 2 diabetes mellitus with diabetic chronic kidney disease: Secondary | ICD-10-CM | POA: Insufficient documentation

## 2020-01-27 DIAGNOSIS — M7989 Other specified soft tissue disorders: Secondary | ICD-10-CM | POA: Diagnosis present

## 2020-01-27 LAB — HEPATIC FUNCTION PANEL
ALT: 13 U/L (ref 0–44)
AST: 24 U/L (ref 15–41)
Albumin: 3.1 g/dL — ABNORMAL LOW (ref 3.5–5.0)
Alkaline Phosphatase: 208 U/L — ABNORMAL HIGH (ref 38–126)
Bilirubin, Direct: 0.2 mg/dL (ref 0.0–0.2)
Indirect Bilirubin: 0.7 mg/dL (ref 0.3–0.9)
Total Bilirubin: 0.9 mg/dL (ref 0.3–1.2)
Total Protein: 6.9 g/dL (ref 6.5–8.1)

## 2020-01-27 LAB — BRAIN NATRIURETIC PEPTIDE: B Natriuretic Peptide: 2744.9 pg/mL — ABNORMAL HIGH (ref 0.0–100.0)

## 2020-01-27 LAB — CBC WITH DIFFERENTIAL/PLATELET
Abs Immature Granulocytes: 0.04 10*3/uL (ref 0.00–0.07)
Basophils Absolute: 0.1 10*3/uL (ref 0.0–0.1)
Basophils Relative: 1 %
Eosinophils Absolute: 0.4 10*3/uL (ref 0.0–0.5)
Eosinophils Relative: 5 %
HCT: 31.5 % — ABNORMAL LOW (ref 36.0–46.0)
Hemoglobin: 10 g/dL — ABNORMAL LOW (ref 12.0–15.0)
Immature Granulocytes: 1 %
Lymphocytes Relative: 12 %
Lymphs Abs: 1 10*3/uL (ref 0.7–4.0)
MCH: 30.6 pg (ref 26.0–34.0)
MCHC: 31.7 g/dL (ref 30.0–36.0)
MCV: 96.3 fL (ref 80.0–100.0)
Monocytes Absolute: 0.6 10*3/uL (ref 0.1–1.0)
Monocytes Relative: 7 %
Neutro Abs: 6.3 10*3/uL (ref 1.7–7.7)
Neutrophils Relative %: 74 %
Platelets: 251 10*3/uL (ref 150–400)
RBC: 3.27 MIL/uL — ABNORMAL LOW (ref 3.87–5.11)
RDW: 16.7 % — ABNORMAL HIGH (ref 11.5–15.5)
WBC: 8.4 10*3/uL (ref 4.0–10.5)
nRBC: 0 % (ref 0.0–0.2)

## 2020-01-27 LAB — BASIC METABOLIC PANEL
Anion gap: 16 — ABNORMAL HIGH (ref 5–15)
BUN: 75 mg/dL — ABNORMAL HIGH (ref 6–20)
CO2: 13 mmol/L — ABNORMAL LOW (ref 22–32)
Calcium: 7.4 mg/dL — ABNORMAL LOW (ref 8.9–10.3)
Chloride: 102 mmol/L (ref 98–111)
Creatinine, Ser: 8.17 mg/dL — ABNORMAL HIGH (ref 0.44–1.00)
GFR calc Af Amer: 6 mL/min — ABNORMAL LOW (ref >60)
GFR calc non Af Amer: 6 mL/min — ABNORMAL LOW (ref >60)
Glucose, Bld: 123 mg/dL — ABNORMAL HIGH (ref 70–99)
Potassium: 6.1 mmol/L — ABNORMAL HIGH (ref 3.5–5.1)
Sodium: 131 mmol/L — ABNORMAL LOW (ref 135–145)

## 2020-01-27 LAB — I-STAT BETA HCG BLOOD, ED (MC, WL, AP ONLY): I-stat hCG, quantitative: <5 m[IU]/mL (ref <5)

## 2020-01-27 LAB — TROPONIN I (HIGH SENSITIVITY): Troponin I (High Sensitivity): 25 ng/L — ABNORMAL HIGH (ref <18)

## 2020-01-27 LAB — LIPASE, BLOOD: Lipase: 27 U/L (ref 11–51)

## 2020-01-27 NOTE — Discharge Instructions (Addendum)
You are leaving [place] at this time.  I strongly recommend that you stay in the hospital at this time to receive dialysis and other treatments.  [place] is very dangerous, the risks including but not limited to worsening of condition, increasing pain, disability and potentially DEATH.  You may return to the emergency department at any time for further evaluation and treatment.

## 2020-01-27 NOTE — ED Provider Notes (Signed)
[facility] DEPT Provider Note   CSN: [id number] Arrival date & time: 01/27/20  1507     History Chief Complaint  Patient presents with   needs dialysis    Kathy Phelps is a 39 y.o. female history of diabetes, bipolar, asthma, obesity, GERD, hypertension, hyperlipidemia, CVA, ESRD.  Patient presents today for concern of fluid overload.  She reports that she has missed dialysis for the last 2 weeks as she could not get a ride to [facility].  She reports that she has had increasing swelling to her bilateral legs and abdomen ongoing for the last 2 weeks.  She also reports mild shortness of breath increasing for the last 2 weeks.  She reports that she has a dialysis session tomorrow in [place] but was concerned for her swelling so came to this ER for evaluation.  Of note patient reports that she is still producing small amounts of urine every day.  She denies fever/chills, headache, neck pain, cough/hemoptysis, chest pain, abdominal pain, nausea/vomiting, dysuria/hematuria or any additional concerns.  HPI     Past Medical History:  Diagnosis Date   Anxiety    Asthma    Bipolar affective ([place])    Complete miscarriage    Constipation    Dehiscence of amputation stump ([place])    left below knee   Depression    Diabetes mellitus    Type II   Dyspnea     " only at night and I stop breathing in my sleep too for about the last three weeks"   GERD (gastroesophageal reflux disease)    Headache    "migraraines"   Hyperlipidemia    Hypertension    Osteomyelitis ([place])    left foot   Pregnancy complication    HELP Syndrome   Renal disorder    Schizophrenia ([place])    Stroke ([place])    " mild stroke", memory loss- approx 2017    Patient Active Problem List   Diagnosis Date Noted   Seizure ([place]) 11/29/2019   ESRD on hemodialysis ([place]) 10/31/2019   Pulmonary edema 10/31/2019   Fluid overload 10/31/2019   Acute  heart failure ([place]) 10/31/2019   Generalized papular rash 10/31/2019   Pneumonia 08/20/2019   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[place]) 02/07/2019   Acute on chronic renal failure ([place]) 02/07/2019   Anxiety and depression    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[place]) 08/11/2018   CKD stage 3 secondary to diabetes ([place]) 08/11/2018   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[place]) 08/11/2018   Gangrene of right foot ([place])    Lower limb ischemia 06/25/2018   Lymphangitis 06/25/2018   Menorrhagia 06/25/2018   Hx of BKA, left ([place]) 05/24/2018   History of left below knee amputation ([place]) 02/01/2018   Gangrene of left foot ([place])    Dehiscence of amputation stump ([place]) 01/26/2018   Surgical wound, non healing 09/02/2017   Bipolar 1 disorder ([place]) 09/02/2017   Nausea vomiting and diarrhea 08/11/2017   Great toe pain, left 08/11/2017   GERD (gastroesophageal reflux disease) 08/10/2017   Anxiety 08/10/2017   Edema    Cellulitis    AKI (acute kidney injury) ([place]) 02/04/2017   HLD (hyperlipidemia) 02/02/2017   Tobacco abuse 02/02/2017   Essential hypertension 02/02/2017   Malnutrition of moderate degree 02/02/2017    Past Surgical History:  Procedure Laterality Date   ABDOMINAL AORTOGRAM N/A 09/07/2017   Procedure: ABDOMINAL AORTOGRAM;  Surgeon: Waynetta Sandy,  MD;  Location: [facility];  Service: Cardiovascular;  Laterality: N/A;   ABDOMINAL AORTOGRAM W/LOWER EXTREMITY N/A 02/03/2017   Procedure: Abdominal Aortogram w/Lower Extremity;  Surgeon: Waynetta Sandy, MD;  Location: [facility];  Service: Cardiovascular;  Laterality: N/A;   ABDOMINAL AORTOGRAM W/LOWER EXTREMITY N/A 06/27/2018   Procedure: ABDOMINAL AORTOGRAM W/LOWER EXTREMITY;  Surgeon: Serafina Mitchell, MD;  Location: [facility];  Service: Cardiovascular;  Laterality: N/A;   AMPUTATION Left 02/10/2017   Procedure: AMPUTATION TOES 3, 4 AND 5   LEFT FOOT;  Surgeon: Waynetta Sandy, MD;  Location: [facility];  Service: Vascular;  Laterality: Left;   AMPUTATION Left 12/21/2017   Procedure: LEFT FOOT 5TH RAY AMPUTATION;  Surgeon: Newt Minion, MD;  Location: [facility];  Service: Orthopedics;  Laterality: Left;   AMPUTATION Left 02/01/2018   Procedure: LEFT BELOW KNEE AMPUTATION;  Surgeon: Newt Minion, MD;  Location: [facility];  Service: Orthopedics;  Laterality: Left;   AMPUTATION Right 08/11/2018   Procedure: AMPUTATION BELOW KNEE;  Surgeon: Newt Minion, MD;  Location: [facility];  Service: Orthopedics;  Laterality: Right;   CESAREAN SECTION     CHOLECYSTECTOMY     LOWER EXTREMITY ANGIOGRAPHY Bilateral 09/07/2017   Procedure: Lower Extremity Angiography;  Surgeon: Waynetta Sandy, MD;  Location: [facility];  Service: Cardiovascular;  Laterality: Bilateral;   PERIPHERAL VASCULAR ATHERECTOMY  02/03/2017   Procedure: Peripheral Vascular Atherectomy;  Surgeon: Waynetta Sandy, MD;  Location: [facility];  Service: Cardiovascular;;   PERIPHERAL VASCULAR ATHERECTOMY Right 06/27/2018   Procedure: PERIPHERAL VASCULAR ATHERECTOMY;  Surgeon: Serafina Mitchell, MD;  Location: [facility];  Service: Cardiovascular;  Laterality: Right;  superficial femoral   PERIPHERAL VASCULAR BALLOON ANGIOPLASTY  02/03/2017   Procedure: Peripheral Vascular Balloon Angioplasty;  Surgeon: Waynetta Sandy, MD;  Location: [facility];  Service: Cardiovascular;;   PERIPHERAL VASCULAR INTERVENTION Left 09/07/2017   Procedure: PERIPHERAL VASCULAR INTERVENTION;  Surgeon: Waynetta Sandy, MD;  Location: [facility];  Service: Cardiovascular;  Laterality: Left;  SFA/POPLITEAL   STUMP REVISION Left 05/24/2018   STUMP REVISION Left 05/24/2018   Procedure: REVISION LEFT BELOW KNEE AMPUTATION;  Surgeon: Newt Minion, MD;  Location: [facility];  Service: Orthopedics;  Laterality: Left;   TUBAL LIGATION         OB History   No obstetric history on file.     Family History  Problem Relation Age of Onset   Diabetes Mellitus II Mother    Heart disease Mother    Heart disease Father     Social History   Tobacco Use   Smoking status: Current Every Day Smoker    Packs/day: 1.00    Years: 28.00    Pack years: 28.00    Types: Cigarettes   Smokeless tobacco: Never Used  Substance Use Topics   Alcohol use: No   Drug use: No    Home Medications Prior to Admission medications   Medication Sig Start Date End Date Taking? Authorizing Provider  ALPRAZolam Duanne Moron) 1 MG tablet Take 1 mg by mouth 3 (three) times daily as needed for anxiety.  06/07/19  Yes [provider]  amLODipine (NORVASC) 5 MG tablet Take 5 mg by mouth every evening.  12/04/19  Yes [provider]  cloNIDine (CATAPRES - DOSED IN MG/24 HR) 0.1 mg/24hr patch Place 0.1 mg onto the skin once a week.  01/03/20  Yes  [provider]  gabapentin (NEURONTIN) 800 MG tablet Take 1 tablet (800 mg total) by mouth 2 (two) times daily. 02/11/17  Yes Hosie Poisson, MD  hydrOXYzine (ATARAX/VISTARIL) 50 MG tablet Take 50 mg by mouth every 6 (six) hours as needed for anxiety.  12/27/19  Yes [provider]  LATUDA 60 MG TABS Take 60 mg by mouth at bedtime.  07/05/19  Yes [provider]  losartan (COZAAR) 50 MG tablet Take 50 mg by mouth every evening.  01/22/20  Yes [provider]  oxyCODONE-acetaminophen (PERCOCET) 7.5-325 MG tablet Take 1 tablet by mouth 4 (four) times daily as needed for pain. 11/23/19  Yes [provider]  Vitamin D, Ergocalciferol, (DRISDOL) 1.25 MG (50000 UT) CAPS capsule Take 50,000 Units by mouth once a week. 07/13/19  Yes [provider]    Allergies    Hydrocodone-acetaminophen and Tylenol [acetaminophen]  Review of Systems   Review of Systems Ten systems are reviewed and are negative for acute change except as noted in the HPI  Physical  Exam Updated Vital Signs BP (!) 170/109 (BP Location: Left Arm)    Pulse (!) 108    Temp 97.9 F (36.6 C) (Oral)    Resp 15    SpO2 100%   Physical Exam Constitutional:      General: She is not in acute distress.    Appearance: Normal appearance. She is well-developed. She is obese. She is not ill-appearing or diaphoretic.  HENT:     Head: Normocephalic and atraumatic.     Right Ear: External ear normal.     Left Ear: External ear normal.     Nose: Nose normal.  Eyes:     General: Vision grossly intact. Gaze aligned appropriately.     Pupils: Pupils are equal, round, and reactive to light.  Neck:     Trachea: Trachea and phonation normal. No tracheal deviation.  Cardiovascular:     Rate and Rhythm: Normal rate and regular rhythm.  Pulmonary:     Effort: Pulmonary effort is normal. No tachypnea, accessory muscle usage or respiratory distress.     Breath sounds: Normal air entry. Decreased breath sounds present.  Abdominal:     General: There is no distension.     Palpations: Abdomen is soft.     Tenderness: There is no abdominal tenderness. There is no guarding or rebound.  Musculoskeletal:        General: Normal range of motion.     Cervical back: Normal range of motion.     Right Lower Extremity: Right leg is amputated below knee.     Left Lower Extremity: Left leg is amputated below knee.  Skin:    General: Skin is warm and dry.  Neurological:     Mental Status: She is alert.     GCS: GCS eye subscore is 4. GCS verbal subscore is 5. GCS motor subscore is 6.     Comments: Speech is clear and goal oriented, follows commands Major Cranial nerves without deficit, no facial droop Moves extremities without ataxia, coordination intact  Psychiatric:        Behavior: Behavior normal.    ED Results / Procedures / Treatments   Labs (all labs ordered are listed, but only abnormal results are displayed) Labs Reviewed  BASIC METABOLIC PANEL - Abnormal; Notable for the following  components:      Result Value   Sodium 131 (*)    Potassium 6.1 (*)    CO2 13 (*)  Glucose, Bld 123 (*)    BUN 75 (*)    Creatinine, Ser 8.17 (*)    Calcium 7.4 (*)    GFR calc non Af Amer 6 (*)    GFR calc Af Amer 6 (*)    Anion gap 16 (*)    All other components within normal limits  HEPATIC FUNCTION PANEL - Abnormal; Notable for the following components:   Albumin 3.1 (*)    Alkaline Phosphatase 208 (*)    All other components within normal limits  BRAIN NATRIURETIC PEPTIDE - Abnormal; Notable for the following components:   B Natriuretic Peptide 2,744.9 (*)    All other components within normal limits  CBC WITH DIFFERENTIAL/PLATELET - Abnormal; Notable for the following components:   RBC 3.27 (*)    Hemoglobin 10.0 (*)    HCT 31.5 (*)    RDW 16.7 (*)    All other components within normal limits  TROPONIN I (HIGH SENSITIVITY) - Abnormal; Notable for the following components:   Troponin I (High Sensitivity) 25 (*)    All other components within normal limits  SARS CORONAVIRUS 2 (TAT 6-24 HRS)  LIPASE, BLOOD  URINALYSIS, ROUTINE W REFLEX MICROSCOPIC  I-STAT BETA HCG BLOOD, ED ([facility], [facility], [facility] ONLY)    EKG None  Radiology DG Chest Portable 1 View  Result Date: 01/27/2020 CLINICAL DATA:  39 year old female with shortness of breath. Patient missed dialysis. EXAM: PORTABLE CHEST 1 VIEW COMPARISON:  Chest radiograph dated 01/04/2020. FINDINGS: Dialysis catheter in similar position. There are bilateral pleural effusions, right greater left and increased since the prior radiograph. Associated bilateral partial compressive atelectasis. Pneumonia is not excluded. Clinical correlation is recommended. No pneumothorax. Stable cardiomegaly. No acute osseous pathology. IMPRESSION: Bilateral pleural effusions, right greater left, increased since the prior radiograph. Electronically Signed   By: Anner Crete M.D.   On: 01/27/2020 16:35    Procedures Procedures (including critical care  time)  Medications Ordered in ED Medications - No data to display  ED Course  I have reviewed the triage vital signs and the nursing notes.  Pertinent labs & imaging results that were available during my care of the patient were reviewed by me and considered in my medical decision making (see chart for details).    MDM Rules/Calculators/A&P                     39 year old female with history as detailed above presents today for bilateral lower extremity abdominal edema ongoing for the past [redacted] weeks along with mild shortness of breath.  She has now gone to dialysis for the last 2 weeks but does report that she is still producing a small amount of urine.  She has no history of infectious symptoms, cough or chest pain.  She reports that she has a dialysis session tomorrow but was concerned due to fluid retention and came into the emergency department.  She is agreeable to blood work and imaging today.  On initial evaluation she is well-appearing, pleasant and in no acute distress.  Lungs diminished but may be secondary to body habitus, heart regular rate and rhythm, abdomen obese nontender and without peritoneal signs. - I have ordered, reviewed and interpreted the following labs.: CBC shows hemoglobin 10.0 which appears improved from prior, no leukocytosis to suggest infection. BMP shows creatinine 8.17, BUN 75 appears to have worsening renal function likely also elevated as she has missed dialysis, her potassium is 6.1, CO2 of 13 and anion gap of 16,  suspect acidosis secondary to renal failure. LFTs reviewed, alk phos 208, albumin 3.1 otherwise within normal limits Lipase within normal limits.pancreatitis. Pregnancy test negative CXR:  IMPRESSION:  Bilateral pleural effusions, right greater left, increased since the  prior radiograph  I have personally reviewed patient's chest x-ray and agree with radiologist interpretation. - 6:10 PM: I reevaluated the patient and updated her on findings as  above.  She is fully alert and oriented and stated understanding of her condition.  She is refusing admission to the hospital and is requesting discharge immediately, her plan is to go to her dialysis session tomorrow morning in [place].  I strongly advised patient against leaving the hospital AMA at this time, I explained to her the plan of admission immediately for dialysis however she refused.  A long discussion was held with the patient and we discussed the risks of leaving [facility] today including but not limited to worsening of condition, pain, disability and potentially death.  Patient then repeated back my concerns, her diagnoses, her condition and the risks of leaving [facility].  She is fully alert and oriented and has mental capacity to make her own medical decisions and has chosen to leave AMA at this time.  Patient was given every opportunity to stay in the ER and was encouraged to return immediately for further evaluation and treatment.   Note: Portions of this report may have been transcribed using voice recognition software. Every effort was made to ensure accuracy; however, inadvertent computerized transcription errors may still be present. Final Clinical Impression(s) / ED Diagnoses Final diagnoses:  End stage renal disease ([place])  Pleural effusion  Acidosis  Hyperkalemia    Rx / DC Orders ED Discharge Orders    None       Gari Crown 01/27/20 1823    Maudie Flakes, MD 01/29/20 [phone]

## 2020-01-27 NOTE — ED Triage Notes (Signed)
Per pt, states she hasnt had dialysis is 2 weeks-states she could not get a ride-states she is still making urine-states abdominal distention-states she is swollen all over-states pitting edema

## 2020-01-28 ENCOUNTER — Other Ambulatory Visit (INDEPENDENT_AMBULATORY_CARE_PROVIDER_SITE_OTHER): Payer: Self-pay | Admitting: Vascular Surgery

## 2020-01-28 DIAGNOSIS — N186 End stage renal disease: Secondary | ICD-10-CM

## 2020-01-29 ENCOUNTER — Other Ambulatory Visit (INDEPENDENT_AMBULATORY_CARE_PROVIDER_SITE_OTHER): Payer: Medicare Other

## 2020-01-29 ENCOUNTER — Other Ambulatory Visit: Payer: Self-pay

## 2020-01-29 ENCOUNTER — Encounter: Payer: Self-pay | Admitting: Emergency Medicine

## 2020-01-29 ENCOUNTER — Emergency Department
Admission: EM | Admit: 2020-01-29 | Discharge: 2020-01-29 | Disposition: A | Discharge status: Home / Self Care | Payer: Medicare Other | Attending: Emergency Medicine | Admitting: Emergency Medicine

## 2020-01-29 ENCOUNTER — Encounter (INDEPENDENT_AMBULATORY_CARE_PROVIDER_SITE_OTHER): Payer: Medicare Other

## 2020-01-29 ENCOUNTER — Telehealth: Payer: Self-pay | Admitting: Adult Health Nurse Practitioner

## 2020-01-29 ENCOUNTER — Encounter (INDEPENDENT_AMBULATORY_CARE_PROVIDER_SITE_OTHER): Payer: Medicaid Other | Admitting: Vascular Surgery

## 2020-01-29 DIAGNOSIS — I132 Hypertensive heart and chronic kidney disease with heart failure and with stage 5 chronic kidney disease, or end stage renal disease: Secondary | ICD-10-CM | POA: Insufficient documentation

## 2020-01-29 DIAGNOSIS — N186 End stage renal disease: Secondary | ICD-10-CM | POA: Diagnosis not present

## 2020-01-29 DIAGNOSIS — Z992 Dependence on renal dialysis: Secondary | ICD-10-CM | POA: Insufficient documentation

## 2020-01-29 DIAGNOSIS — E1122 Type 2 diabetes mellitus with diabetic chronic kidney disease: Secondary | ICD-10-CM | POA: Insufficient documentation

## 2020-01-29 DIAGNOSIS — F17210 Nicotine dependence, cigarettes, uncomplicated: Secondary | ICD-10-CM | POA: Diagnosis not present

## 2020-01-29 DIAGNOSIS — Z79899 Other long term (current) drug therapy: Secondary | ICD-10-CM | POA: Diagnosis not present

## 2020-01-29 DIAGNOSIS — I509 Heart failure, unspecified: Secondary | ICD-10-CM | POA: Insufficient documentation

## 2020-01-29 DIAGNOSIS — Z7984 Long term (current) use of oral hypoglycemic drugs: Secondary | ICD-10-CM | POA: Diagnosis not present

## 2020-01-29 DIAGNOSIS — E875 Hyperkalemia: Secondary | ICD-10-CM

## 2020-01-29 LAB — CBC
HCT: 25.6 % — ABNORMAL LOW (ref 36.0–46.0)
Hemoglobin: 8.7 g/dL — ABNORMAL LOW (ref 12.0–15.0)
MCH: 31 pg (ref 26.0–34.0)
MCHC: 34 g/dL (ref 30.0–36.0)
MCV: 91.1 fL (ref 80.0–100.0)
Platelets: 205 10*3/uL (ref 150–400)
RBC: 2.81 MIL/uL — ABNORMAL LOW (ref 3.87–5.11)
RDW: 16.2 % — ABNORMAL HIGH (ref 11.5–15.5)
WBC: 5 10*3/uL (ref 4.0–10.5)
nRBC: 0 % (ref 0.0–0.2)

## 2020-01-29 MED ORDER — ALTEPLASE 2 MG IJ SOLR
2.0000 mg | Freq: Once | INTRAMUSCULAR | Status: DC | PRN
  Filled 2020-01-29: qty 2

## 2020-01-29 MED ORDER — LIDOCAINE HCL (PF) 1 % IJ SOLN: 5.0000 mL | INTRAMUSCULAR | Status: DC | PRN

## 2020-01-29 MED ORDER — SODIUM CHLORIDE 0.9 % IV SOLN: 100.0000 mL | INTRAVENOUS | Status: DC | PRN

## 2020-01-29 MED ORDER — HEPARIN SODIUM (PORCINE) 1000 UNIT/ML DIALYSIS
1000.0000 [IU] | INTRAMUSCULAR | Status: DC | PRN
  Filled 2020-01-29: qty 1

## 2020-01-29 MED ORDER — LIDOCAINE-PRILOCAINE 2.5-2.5 % EX CREA: 1.0000 "application " | TOPICAL_CREAM | CUTANEOUS | Status: DC | PRN

## 2020-01-29 MED ORDER — PENTAFLUOROPROP-TETRAFLUOROETH EX AERO: 1.0000 "application " | INHALATION_SPRAY | CUTANEOUS | Status: DC | PRN

## 2020-01-29 MED ORDER — CHLORHEXIDINE GLUCONATE 4 % EX LIQD: Freq: Every day | CUTANEOUS | Status: DC

## 2020-01-29 NOTE — ED Notes (Signed)
Pt left prior to receiving discharge instruction, Kathy Phelps, [place], made aware.

## 2020-01-29 NOTE — Telephone Encounter (Signed)
Called patient to schedule the Palliative Consult, no answer - left message with reason for call along with my name and contact number. 

## 2020-01-29 NOTE — ED Notes (Signed)
ACTUAL ROOM ED40A

## 2020-01-29 NOTE — ED Triage Notes (Signed)
Patient states she has missed 3 weeks of dialysis. Reports she was sent to ED for a dialysis treatment in hospital per nephrologist.

## 2020-01-29 NOTE — ED Provider Notes (Signed)
[facility] Emergency Department Provider Note ____________________________________________  Time seen: 1723  I have reviewed the triage vital signs and the nursing notes.  HISTORY  Chief Complaint  Dialysis  HPI Kathy Phelps is a 39 y.o. female with a history of ESRD, HTN, CHF, hyperlipidemia, stroke, and bilateral LE amputations, presents to the ED at the advice of her primary care provider for dialysis.  Patient has admittedly been noncompliant with dialysis for least the last 3 weeks patient made a conscious decision to discontinue dialysis, and was requesting hospice care by her primary provider.  She had a visit today with her PCP, and he declined supporting the patient's decision for hospice, she presents now for evaluation of elevated potassium, BNP, BUN/creatinine.  She was evaluated 2 days ago at [facility] long ED for dialysis, and left AMA.  According to the patient, the ED provider was arranging transport to [facility] for dialysis, and the patient refused transfer, as she feels that the staff at [facility] are extremely rude to her.  She denies any interim fever, chills, or sweats.  She also denies any cough, chest pain, or shortness of breath.   She normally goes to the [facility] center here in [place] on Monday/Wednesday/Friday for dialysis.  Past Medical History:  Diagnosis Date  . Anxiety   . Asthma   . Bipolar affective ([place])   . Complete miscarriage   . Constipation   . 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 Depression   . Diabetes mellitus    Type II  . Dyspnea     " only at night and I stop breathing in my sleep too for about the last three weeks"  . GERD (gastroesophageal reflux disease)   . Headache    "migraraines"  . Hyperlipidemia   . Hypertension   . Osteomyelitis ([place])    left foot  . Pregnancy complication    HELP Syndrome  . Renal disorder   . Schizophrenia ([place])   . Stroke [facility])    " mild stroke", memory  loss- approx 2017    Patient Active Problem List   Diagnosis Date Noted  . Seizure ([place]) 11/29/2019  . ESRD on hemodialysis ([place]) 10/31/2019  . Pulmonary edema 10/31/2019  . Fluid overload 10/31/2019  . Acute heart failure ([place]) 10/31/2019  . Generalized papular rash 10/31/2019  . Pneumonia 08/20/2019  . 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[place]) 02/07/2019  . Acute on chronic renal failure ([place]) 02/07/2019  . Anxiety and depression   . 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[place]) 08/11/2018  . CKD stage 3 secondary to diabetes ([place]) 08/11/2018  . 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 Gangrene of right foot ([place])   . Lower limb ischemia 06/25/2018  . Lymphangitis 06/25/2018  . Menorrhagia 06/25/2018  . Hx of BKA, left ([place]) 05/24/2018  . History of left below knee amputation ([place]) 02/01/2018  . Gangrene of left foot ([place])   . Dehiscence of amputation stump ([place]) 01/26/2018  . Surgical wound, non healing 09/02/2017  . Bipolar 1 disorder ([place]) 09/02/2017  . Nausea vomiting and diarrhea 08/11/2017  . Great toe pain, left 08/11/2017  . GERD (gastroesophageal reflux disease) 08/10/2017  . Anxiety 08/10/2017  . Edema   . Cellulitis   . AKI (acute kidney injury) ([place]) 02/04/2017  . HLD (hyperlipidemia) 02/02/2017  . Tobacco abuse 02/02/2017  . Essential hypertension 02/02/2017  . Malnutrition of  moderate degree 02/02/2017    Past Surgical History:  Procedure Laterality Date  . ABDOMINAL AORTOGRAM N/A 09/07/2017   Procedure: ABDOMINAL AORTOGRAM;  Surgeon: Waynetta Sandy, MD;  Location: [place] [facility];  Service: Cardiovascular;  Laterality: N/A;  . ABDOMINAL AORTOGRAM W/LOWER EXTREMITY N/A 02/03/2017   Procedure: Abdominal Aortogram w/Lower Extremity;  Surgeon: Waynetta Sandy, MD;  Location: [place] [facility];  Service: Cardiovascular;  Laterality: N/A;  . ABDOMINAL AORTOGRAM W/LOWER EXTREMITY N/A 06/27/2018   Procedure:  ABDOMINAL AORTOGRAM W/LOWER EXTREMITY;  Surgeon: Serafina Mitchell, MD;  Location: [place] [facility];  Service: Cardiovascular;  Laterality: N/A;  . AMPUTATION Left 02/10/2017   Procedure: AMPUTATION TOES 3, 4 AND 5  LEFT FOOT;  Surgeon: Waynetta Sandy, MD;  Location: [place];  Service: Vascular;  Laterality: Left;  . AMPUTATION Left 12/21/2017   Procedure: LEFT FOOT 5TH RAY AMPUTATION;  Surgeon: Newt Minion, MD;  Location: [place];  Service: Orthopedics;  Laterality: Left;  . AMPUTATION Left 02/01/2018   Procedure: LEFT BELOW KNEE AMPUTATION;  Surgeon: Newt Minion, MD;  Location: [place];  Service: Orthopedics;  Laterality: Left;  . AMPUTATION Right 08/11/2018   Procedure: AMPUTATION BELOW KNEE;  Surgeon: Newt Minion, MD;  Location: [facility] [place];  Service: Orthopedics;  Laterality: Right;  . CESAREAN SECTION    . CHOLECYSTECTOMY    . LOWER EXTREMITY ANGIOGRAPHY Bilateral 09/07/2017   Procedure: Lower Extremity Angiography;  Surgeon: Waynetta Sandy, MD;  Location: [place] [facility];  Service: Cardiovascular;  Laterality: Bilateral;  . PERIPHERAL VASCULAR ATHERECTOMY  02/03/2017   Procedure: Peripheral Vascular Atherectomy;  Surgeon: Waynetta Sandy, MD;  Location: [place] [facility];  Service: Cardiovascular;;  . PERIPHERAL VASCULAR ATHERECTOMY Right 06/27/2018   Procedure: PERIPHERAL VASCULAR ATHERECTOMY;  Surgeon: Serafina Mitchell, MD;  Location: [place] [facility];  Service: Cardiovascular;  Laterality: Right;  superficial femoral  . PERIPHERAL VASCULAR BALLOON ANGIOPLASTY  02/03/2017   Procedure: Peripheral Vascular Balloon Angioplasty;  Surgeon: Waynetta Sandy, MD;  Location: [place] [facility];  Service: Cardiovascular;;  . PERIPHERAL VASCULAR INTERVENTION Left 09/07/2017   Procedure: PERIPHERAL VASCULAR INTERVENTION;  Surgeon: Waynetta Sandy, MD;  Location: [place] [facility];  Service: Cardiovascular;  Laterality: Left;  SFA/POPLITEAL  . STUMP  REVISION Left 05/24/2018  . STUMP REVISION Left 05/24/2018   Procedure: REVISION LEFT BELOW KNEE AMPUTATION;  Surgeon: Newt Minion, MD;  Location: [place];  Service: Orthopedics;  Laterality: Left;  . TUBAL LIGATION      Prior to Admission medications   Medication Sig Start Date End Date Taking? Authorizing Provider  ALPRAZolam Duanne Moron) 1 MG tablet Take 1 mg by mouth 3 (three) times daily as needed for anxiety.  06/07/19   [provider]  amLODipine (NORVASC) 5 MG tablet Take 5 mg by mouth every evening.  12/04/19   [provider]  cloNIDine (CATAPRES - DOSED IN MG/24 HR) 0.1 mg/24hr patch Place 0.1 mg onto the skin once a week.  01/03/20   [provider]  gabapentin (NEURONTIN) 800 MG tablet Take 1 tablet (800 mg total) by mouth 2 (two) times daily. 02/11/17   Hosie Poisson, MD  hydrOXYzine (ATARAX/VISTARIL) 50 MG tablet Take 50 mg by mouth every 6 (six) hours as needed for anxiety.  12/27/19   [provider]  LATUDA 60 MG TABS Take 60 mg by mouth at bedtime.  07/05/19   [provider]  losartan (COZAAR) 50  MG tablet Take 50 mg by mouth every evening.  01/22/20   [provider]  oxyCODONE-acetaminophen (PERCOCET) 7.5-325 MG tablet Take 1 tablet by mouth 4 (four) times daily as needed for pain. 11/23/19   [provider]  Vitamin D, Ergocalciferol, (DRISDOL) 1.25 MG (50000 UT) CAPS capsule Take 50,000 Units by mouth once a week. 07/13/19   [provider]    Allergies Hydrocodone-acetaminophen and Tylenol [acetaminophen]  Family History  Problem Relation Age of Onset  . Diabetes Mellitus II Mother   . Heart disease Mother   . Heart disease Father     Social History Social History   Tobacco Use  . Smoking status: Current Every Day Smoker    Packs/day: 1.00    Years: 28.00    Pack years: 28.00    Types: Cigarettes  . Smokeless tobacco: Never Used  Substance Use Topics  . Alcohol use: No  . Drug use: No    Review of Systems  Constitutional: Negative for fever. Eyes: Negative for visual changes. Cardiovascular: Negative for chest pain. Respiratory: Negative for shortness of breath. Gastrointestinal: Negative for abdominal pain, vomiting and diarrhea. Genitourinary: Negative for dysuria. Musculoskeletal: Negative for back pain. Skin: Negative for rash. Neurological: Negative for headaches, focal weakness or numbness. ____________________________________________  PHYSICAL EXAM:  VITAL SIGNS: ED Triage Vitals  Enc Vitals Group     BP 01/29/20 1634 (!) 158/108     Pulse Rate 01/29/20 1634 96     Resp 01/29/20 1634 16     Temp 01/29/20 1634 98.3 F (36.8 C)     Temp Source 01/29/20 1634 Oral     SpO2 01/29/20 1634 99 %     Weight --      Height --      Head Circumference --      Peak Flow --      Pain Score 01/29/20 1641 0     Pain Loc --      Pain Edu? --      Excl. in [place]? --     Constitutional: Alert and oriented. Well appearing and in no distress. Head: Normocephalic and atraumatic. Eyes: Conjunctivae are normal. No ictheris. Normal extraocular movements Cardiovascular: Normal rate, regular rhythm. Normal distal pulses. Respiratory: Normal respiratory effort. No wheezes/rales/rhonchi. Gastrointestinal: Soft and nontender. No distention. Musculoskeletal: Nontender with normal range of motion in all extremities.  Neurologic:  Normal gait without ataxia. Normal speech and language. No gross focal neurologic deficits are appreciated. Skin:  Skin is warm, dry and intact. No rash noted. Psychiatric: Mood and affect are normal. Patient exhibits appropriate insight and judgment. ____________________________________________   LABS (pertinent positives/negatives) Labs Reviewed  CBC - Abnormal; Notable for the following components:      Result Value   RBC 2.81 (*)    Hemoglobin 8.7 (*)    HCT 25.6 (*)    RDW 16.2 (*)    All other components within normal limits  SARS  CORONAVIRUS 2 (TAT 6-24 HRS)  BASIC METABOLIC PANEL  BRAIN NATRIURETIC PEPTIDE  TROPONIN I (HIGH SENSITIVITY)  TROPONIN I (HIGH SENSITIVITY)  ____________________________________________  EKG See report ____________________________________________  PROCEDURES  Procedures ____________________________________________  INITIAL IMPRESSION / ASSESSMENT AND PLAN / ED COURSE  ----------------------------------------- 5:52 PM on 01/29/2020 ----------------------------------------- S/w Lateef (nephrology) he requests repeat BMP prior to dialysis and expectant management of her hyperkalemia.   SKYRA CRICHLOW was evaluated in Emergency Department on 01/29/2020 for the symptoms described in the history of present illness. She was evaluated  in the context of the global COVID-19 pandemic, which necessitated consideration that the patient might be at risk for infection with the SARS-CoV-2 virus that causes COVID-19. Institutional protocols and algorithms that pertain to the evaluation of patients at risk for COVID-19 are in a state of rapid change based on information released by regulatory bodies including the CDC and federal and state organizations. These policies and algorithms were followed during the patient's care in the ED. ____________________________________________  FINAL CLINICAL IMPRESSION(S) / ED DIAGNOSES  Final diagnoses:  ESRD (end stage renal disease) on dialysis [facility])  Hyperkalemia      Allon Costlow, Dannielle Karvonen, PA-C 01/29/20 2051    [name], MD 01/29/20 2356

## 2020-01-29 NOTE — Progress Notes (Signed)
Hd started  

## 2020-01-29 NOTE — Progress Notes (Signed)
Dialysis catheter dressing changed.

## 2020-01-29 NOTE — Progress Notes (Signed)
Hd completed 

## 2020-01-30 ENCOUNTER — Telehealth: Payer: Self-pay | Admitting: Adult Health Nurse Practitioner

## 2020-01-30 NOTE — Telephone Encounter (Signed)
Spoke with patient regarding Palliative services and all questions were answered and she was in agreement with this.  Patient stated that she is currently staying in a hotel in [place] and she in the process of getting ready to leave to go to her dialysis appointment.  I asked patient if I could call her back tomorrow to finish scheduling the appointment and she was agreeable with this.

## 2020-02-01 ENCOUNTER — Telehealth: Payer: Self-pay | Admitting: Hospice

## 2020-02-01 NOTE — Telephone Encounter (Signed)
Spoke with patient regarding Palliative services and have scheduled a Telephone Consult for 02/07/20 @ 1 PM.  Patient is currently living in hotel in [facility]:  [address], [place], [place]  [address].

## 2020-02-07 ENCOUNTER — Other Ambulatory Visit: Payer: Medicare Other | Admitting: Hospice

## 2020-02-07 ENCOUNTER — Other Ambulatory Visit: Payer: Self-pay

## 2020-02-07 DIAGNOSIS — Z515 Encounter for palliative care: Secondary | ICD-10-CM

## 2020-02-07 NOTE — Progress Notes (Signed)
    [facility] Consult Note Telephone: [phone]  Fax: [phone]  PATIENT NAME: Kathy Phelps DOB: 05-31-1981 MRN: [id number]  Patient is a no show, called her several times and left her voicemails with call back number  Teodoro Spray, NP

## 2020-03-21 ENCOUNTER — Emergency Department
Admission: EM | Admit: 2020-03-21 | Discharge: 2020-03-21 | Disposition: A | Discharge status: Home / Self Care | Payer: Medicare Other | Source: Home / Self Care | Attending: Emergency Medicine | Admitting: Emergency Medicine

## 2020-03-21 ENCOUNTER — Encounter: Payer: Self-pay | Admitting: Emergency Medicine

## 2020-03-21 ENCOUNTER — Emergency Department: Payer: Medicare Other

## 2020-03-21 ENCOUNTER — Other Ambulatory Visit: Payer: Self-pay

## 2020-03-21 DIAGNOSIS — N186 End stage renal disease: Secondary | ICD-10-CM

## 2020-03-21 DIAGNOSIS — E119 Type 2 diabetes mellitus without complications: Secondary | ICD-10-CM

## 2020-03-21 DIAGNOSIS — R4182 Altered mental status, unspecified: Secondary | ICD-10-CM | POA: Diagnosis not present

## 2020-03-21 DIAGNOSIS — K7201 Acute and subacute hepatic failure with coma: Secondary | ICD-10-CM | POA: Diagnosis not present

## 2020-03-21 DIAGNOSIS — R0789 Other chest pain: Secondary | ICD-10-CM

## 2020-03-21 LAB — COMPREHENSIVE METABOLIC PANEL
ALT: 9 U/L (ref 0–44)
AST: 20 U/L (ref 15–41)
Albumin: 2.4 g/dL — ABNORMAL LOW (ref 3.5–5.0)
Alkaline Phosphatase: 211 U/L — ABNORMAL HIGH (ref 38–126)
Anion gap: 14 (ref 5–15)
BUN: 47 mg/dL — ABNORMAL HIGH (ref 6–20)
CO2: 20 mmol/L — ABNORMAL LOW (ref 22–32)
Calcium: 7.8 mg/dL — ABNORMAL LOW (ref 8.9–10.3)
Chloride: 89 mmol/L — ABNORMAL LOW (ref 98–111)
Creatinine, Ser: 5.83 mg/dL — ABNORMAL HIGH (ref 0.44–1.00)
GFR calc Af Amer: 10 mL/min — ABNORMAL LOW (ref >60)
GFR calc non Af Amer: 8 mL/min — ABNORMAL LOW (ref >60)
Glucose, Bld: 101 mg/dL — ABNORMAL HIGH (ref 70–99)
Potassium: 4.9 mmol/L (ref 3.5–5.1)
Sodium: 123 mmol/L — ABNORMAL LOW (ref 135–145)
Total Bilirubin: 4 mg/dL — ABNORMAL HIGH (ref 0.3–1.2)
Total Protein: 6 g/dL — ABNORMAL LOW (ref 6.5–8.1)

## 2020-03-21 LAB — CBC WITH DIFFERENTIAL/PLATELET
Abs Immature Granulocytes: 0.09 10*3/uL — ABNORMAL HIGH (ref 0.00–0.07)
Basophils Absolute: 0 10*3/uL (ref 0.0–0.1)
Basophils Relative: 0 %
Eosinophils Absolute: 0.1 10*3/uL (ref 0.0–0.5)
Eosinophils Relative: 1 %
HCT: 32.4 % — ABNORMAL LOW (ref 36.0–46.0)
Hemoglobin: 10.8 g/dL — ABNORMAL LOW (ref 12.0–15.0)
Immature Granulocytes: 1 %
Lymphocytes Relative: 2 %
Lymphs Abs: 0.2 10*3/uL — ABNORMAL LOW (ref 0.7–4.0)
MCH: 30.4 pg (ref 26.0–34.0)
MCHC: 33.3 g/dL (ref 30.0–36.0)
MCV: 91.3 fL (ref 80.0–100.0)
Monocytes Absolute: 0.2 10*3/uL (ref 0.1–1.0)
Monocytes Relative: 3 %
Neutro Abs: 6.2 10*3/uL (ref 1.7–7.7)
Neutrophils Relative %: 93 %
Platelets: 96 10*3/uL — ABNORMAL LOW (ref 150–400)
RBC: 3.55 MIL/uL — ABNORMAL LOW (ref 3.87–5.11)
RDW: 14.5 % (ref 11.5–15.5)
Smear Review: NORMAL
WBC: 6.7 10*3/uL (ref 4.0–10.5)
nRBC: 0 % (ref 0.0–0.2)

## 2020-03-21 LAB — ETHANOL: Alcohol, Ethyl (B): <10 mg/dL (ref <10)

## 2020-03-21 LAB — TROPONIN I (HIGH SENSITIVITY)
Troponin I (High Sensitivity): 38 ng/L — ABNORMAL HIGH (ref <18)
Troponin I (High Sensitivity): 52 ng/L — ABNORMAL HIGH (ref <18)

## 2020-03-21 LAB — LIPASE, BLOOD: Lipase: 14 U/L (ref 11–51)

## 2020-03-21 MED ORDER — OXYCODONE HCL 5 MG PO TABS
5.0000 mg | ORAL_TABLET | ORAL | Status: AC
  Administered 2020-03-21: 5 mg via ORAL
  Filled 2020-03-21: qty 1

## 2020-03-21 MED ORDER — SODIUM CHLORIDE 0.9 % IV BOLUS
500.0000 mL | Freq: Once | INTRAVENOUS | Status: AC
  Administered 2020-03-21: 500 mL via INTRAVENOUS

## 2020-03-21 MED ORDER — FENTANYL CITRATE (PF) 100 MCG/2ML IJ SOLN
INTRAMUSCULAR | Status: AC
  Administered 2020-03-21: 50 ug via INTRAVENOUS
  Filled 2020-03-21: qty 2

## 2020-03-21 MED ORDER — FENTANYL CITRATE (PF) 100 MCG/2ML IJ SOLN
50.0000 ug | Freq: Once | INTRAMUSCULAR | Status: AC
  Administered 2020-03-21: 50 ug via INTRAVENOUS
  Filled 2020-03-21: qty 2

## 2020-03-21 MED ORDER — FENTANYL CITRATE (PF) 100 MCG/2ML IJ SOLN: 50.0000 ug | Freq: Once | INTRAMUSCULAR | Status: AC

## 2020-03-21 MED ORDER — ONDANSETRON HCL 4 MG/2ML IJ SOLN
4.0000 mg | Freq: Once | INTRAMUSCULAR | Status: AC
  Administered 2020-03-21: 4 mg via INTRAVENOUS
  Filled 2020-03-21: qty 2

## 2020-03-21 NOTE — ED Provider Notes (Signed)
[facility] Emergency Department Provider Note  ____________________________________________  Time seen: Approximately 4:20 PM  I have reviewed the triage vital signs and the nursing notes.   HISTORY  Chief Complaint Chest Pain    HPI Kathy Phelps is a 39 y.o. female with a history of anxiety asthma bipolar disorder diabetes hypertension end-stage renal disease on hemodialysis Monday Wednesday Friday CHF who comes the ED complaining of chest pain starting at about 2:00 PM.  Feels sharp, worse with deep breathing, associated with shortness of breath, radiates to the back.  Worse with movement of the torso and shoulder range of motion as well.  Denies any falls or injuries.  No recent heavy lifting.  Not exertional.  Denies orthopnea.  No dyspnea on exertion.  No diaphoresis or vomiting or palpitations or syncope.  She also notes that she has been feeling sick for the past 5 days with diarrhea, generalized abdominal discomfort, occasional vomiting and decreased oral intake.  She did not have dialysis 4 days ago or 2 days ago on her schedule days due to the diarrhea.      Past Medical History:  Diagnosis Date  . Anxiety   . Asthma   . Bipolar affective ([place])   . Complete miscarriage   . Constipation   . 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 Depression   . Diabetes mellitus    Type II  . Dyspnea     " only at night and I stop breathing in my sleep too for about the last three weeks"  . GERD (gastroesophageal reflux disease)   . Headache    "migraraines"  . Hyperlipidemia   . Hypertension   . Osteomyelitis ([place])    left foot  . Pregnancy complication    HELP Syndrome  . Renal disorder   . Schizophrenia ([place])   . Stroke [facility])    " mild stroke", memory loss- approx 2017     Patient Active Problem List   Diagnosis Date Noted  . Seizure ([place]) 11/29/2019  . ESRD on hemodialysis ([place]) 10/31/2019  . Pulmonary edema 10/31/2019   . Fluid overload 10/31/2019  . Acute heart failure ([place]) 10/31/2019  . Generalized papular rash 10/31/2019  . Pneumonia 08/20/2019  . 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 Acute on chronic renal failure ([place]) 02/07/2019  . Anxiety and depression   . 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[place]) 08/11/2018  . CKD stage 3 secondary to diabetes ([place]) 08/11/2018  . 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 Gangrene of right foot ([place])   . Lower limb ischemia 06/25/2018  . Lymphangitis 06/25/2018  . Menorrhagia 06/25/2018  . Hx of BKA, left ([place]) 05/24/2018  . History of left below knee amputation ([place]) 02/01/2018  . Gangrene of left foot ([place])   . Dehiscence of amputation stump ([place]) 01/26/2018  . Surgical wound, non healing 09/02/2017  . Bipolar 1 disorder ([place]) 09/02/2017  . Nausea vomiting and diarrhea 08/11/2017  . Great toe pain, left 08/11/2017  . GERD (gastroesophageal reflux disease) 08/10/2017  . Anxiety 08/10/2017  . Edema   . Cellulitis   . AKI (acute kidney injury) ([place]) 02/04/2017  . HLD (hyperlipidemia) 02/02/2017  . Tobacco abuse 02/02/2017  . Essential hypertension 02/02/2017  . Malnutrition of moderate degree 02/02/2017     Past Surgical History:  Procedure Laterality Date  . ABDOMINAL AORTOGRAM N/A 09/07/2017   Procedure:  ABDOMINAL AORTOGRAM;  Surgeon: Waynetta Sandy, MD;  Location: [place] [facility];  Service: Cardiovascular;  Laterality: N/A;  . ABDOMINAL AORTOGRAM W/LOWER EXTREMITY N/A 02/03/2017   Procedure: Abdominal Aortogram w/Lower Extremity;  Surgeon: Waynetta Sandy, MD;  Location: [place] [facility];  Service: Cardiovascular;  Laterality: N/A;  . ABDOMINAL AORTOGRAM W/LOWER EXTREMITY N/A 06/27/2018   Procedure: ABDOMINAL AORTOGRAM W/LOWER EXTREMITY;  Surgeon: Serafina Mitchell, MD;  Location: [place] [facility];  Service: Cardiovascular;  Laterality: N/A;  . AMPUTATION Left 02/10/2017    Procedure: AMPUTATION TOES 3, 4 AND 5  LEFT FOOT;  Surgeon: Waynetta Sandy, MD;  Location: [place];  Service: Vascular;  Laterality: Left;  . AMPUTATION Left 12/21/2017   Procedure: LEFT FOOT 5TH RAY AMPUTATION;  Surgeon: Newt Minion, MD;  Location: [place];  Service: Orthopedics;  Laterality: Left;  . AMPUTATION Left 02/01/2018   Procedure: LEFT BELOW KNEE AMPUTATION;  Surgeon: Newt Minion, MD;  Location: [place];  Service: Orthopedics;  Laterality: Left;  . AMPUTATION Right 08/11/2018   Procedure: AMPUTATION BELOW KNEE;  Surgeon: Newt Minion, MD;  Location: [place];  Service: Orthopedics;  Laterality: Right;  . CESAREAN SECTION    . CHOLECYSTECTOMY    . LOWER EXTREMITY ANGIOGRAPHY Bilateral 09/07/2017   Procedure: Lower Extremity Angiography;  Surgeon: Waynetta Sandy, MD;  Location: [place] [facility];  Service: Cardiovascular;  Laterality: Bilateral;  . PERIPHERAL VASCULAR ATHERECTOMY  02/03/2017   Procedure: Peripheral Vascular Atherectomy;  Surgeon: Waynetta Sandy, MD;  Location: [place] [facility];  Service: Cardiovascular;;  . PERIPHERAL VASCULAR ATHERECTOMY Right 06/27/2018   Procedure: PERIPHERAL VASCULAR ATHERECTOMY;  Surgeon: Serafina Mitchell, MD;  Location: [place] [facility];  Service: Cardiovascular;  Laterality: Right;  superficial femoral  . PERIPHERAL VASCULAR BALLOON ANGIOPLASTY  02/03/2017   Procedure: Peripheral Vascular Balloon Angioplasty;  Surgeon: Waynetta Sandy, MD;  Location: [place] [facility];  Service: Cardiovascular;;  . PERIPHERAL VASCULAR INTERVENTION Left 09/07/2017   Procedure: PERIPHERAL VASCULAR INTERVENTION;  Surgeon: Waynetta Sandy, MD;  Location: [place] [facility];  Service: Cardiovascular;  Laterality: Left;  SFA/POPLITEAL  . STUMP REVISION Left 05/24/2018  . STUMP REVISION Left 05/24/2018   Procedure: REVISION LEFT BELOW KNEE AMPUTATION;  Surgeon: Newt Minion, MD;  Location: [place];  Service:  Orthopedics;  Laterality: Left;  . TUBAL LIGATION       Prior to Admission medications   Medication Sig Start Date End Date Taking? Authorizing Provider  ALPRAZolam Duanne Moron) 1 MG tablet Take 1 mg by mouth 3 (three) times daily as needed for anxiety.  06/07/19  Yes [provider]  amLODipine (NORVASC) 5 MG tablet Take 5 mg by mouth every evening.  12/04/19  Yes [provider]  baclofen (LIORESAL) 10 MG tablet Take 10 mg by mouth 3 (three) times daily as needed. 02/28/20  Yes [provider]  cloNIDine (CATAPRES - DOSED IN MG/24 HR) 0.1 mg/24hr patch Place 0.1 mg onto the skin once a week.  01/03/20  Yes [provider]  gabapentin (NEURONTIN) 800 MG tablet Take 1 tablet (800 mg total) by mouth 2 (two) times daily. 02/11/17  Yes Hosie Poisson, MD  hydrOXYzine (ATARAX/VISTARIL) 50 MG tablet Take 50 mg by mouth every 6 (six) hours as needed for anxiety.  12/27/19  Yes [provider]  LATUDA 60 MG TABS Take 60 mg by mouth at bedtime.  07/05/19  Yes [provider]  losartan (  COZAAR) 50 MG tablet Take 50 mg by mouth every evening.  01/22/20  Yes [provider]  oxyCODONE-acetaminophen (PERCOCET) 10-325 MG tablet Take 1 tablet by mouth 4 (four) times daily as needed. 02/28/20  Yes [provider]  Vitamin D, Ergocalciferol, (DRISDOL) 1.25 MG (50000 UT) CAPS capsule Take 50,000 Units by mouth once a week. 07/13/19  Yes [provider]  oxyCODONE-acetaminophen (PERCOCET) 7.5-325 MG tablet Take 1 tablet by mouth 4 (four) times daily as needed for pain. 11/23/19   [provider]     Allergies Hydrocodone-acetaminophen and Tylenol [acetaminophen]   Family History  Problem Relation Age of Onset  . Diabetes Mellitus II Mother   . Heart disease Mother   . Heart disease Father     Social History Social History   Tobacco Use  . Smoking status: Current Every Day Smoker    Packs/day: 1.00    Years: 28.00    Pack  years: 28.00    Types: Cigarettes  . Smokeless tobacco: Never Used  Vaping Use  . Vaping Use: Former  Substance Use Topics  . Alcohol use: No  . Drug use: No    Review of Systems  Constitutional:   No fever or chills.  ENT:   No sore throat. No rhinorrhea. Cardiovascular:   Positive chest pain as above without syncope. Respiratory: Positive shortness of breath without cough. Gastrointestinal:   Positive as above for abdominal pain vomiting and diarrhea.  Musculoskeletal:   Negative for focal pain or swelling All other systems reviewed and are negative except as documented above in ROS and HPI.  ____________________________________________   PHYSICAL EXAM:  VITAL SIGNS: ED Triage Vitals  Enc Vitals Group     BP 03/21/20 1515 (!) 143/94     Pulse Rate 03/21/20 1515 86     Resp 03/21/20 1515 17     Temp 03/21/20 1515 98.2 F (36.8 C)     Temp src --      SpO2 03/21/20 1515 97 %     Weight 03/21/20 1513 205 lb 7.5 oz (93.2 kg)     Height 03/21/20 1513 4\' 1"  (1.245 m)     Head Circumference --      Peak Flow --      Pain Score 03/21/20 1513 4     Pain Loc --      Pain Edu? --      Excl. in [place]? --     Vital signs reviewed, nursing assessments reviewed.   Constitutional:   Alert and oriented. Non-toxic appearance. Eyes:   Conjunctivae are normal. EOMI. PERRL. ENT      Head:   Normocephalic and atraumatic.      Nose:   Normal      Mouth/Throat: Dry mucous membranes.      Neck:   No meningismus. Full ROM. Hematological/Lymphatic/Immunilogical:   No cervical lymphadenopathy. Cardiovascular:   RRR. Symmetric bilateral radial and DP pulses.  No murmurs. Cap refill less than 2 seconds.  Tunneled right IJ dialysis catheter protruding from the right chest Respiratory:   Normal respiratory effort without tachypnea/retractions. Breath sounds are clear and equal bilaterally. No wheezes/rales/rhonchi. Gastrointestinal:   Soft with mild generalized tenderness, nonfocal. Non  distended. There is no CVA tenderness.  No rebound, rigidity, or guarding.  Musculoskeletal: Pronounced tenderness of the soft tissue musculature of the left upper chest anteriorly and posteriorly reproducing the chest pain.  No focal bony tenderness.  Shoulder joint space and AC joint nontender. Status post  bilateral BKA.  Normal range of motion in all extremities. No joint effusions.  No lower extremity tenderness.  No lower extremity edema. Neurologic:   Normal speech and language.  Motor grossly intact. No acute focal neurologic deficits are appreciated.  Skin:    Skin is warm, dry and intact. No rash noted.  No petechiae, purpura, or bullae.  ____________________________________________    LABS (pertinent positives/negatives) (all labs ordered are listed, but only abnormal results are displayed) Labs Reviewed  COMPREHENSIVE METABOLIC PANEL - Abnormal; Notable for the following components:      Result Value   Sodium 123 (*)    Chloride 89 (*)    CO2 20 (*)    Glucose, Bld 101 (*)    BUN 47 (*)    Creatinine, Ser 5.83 (*)    Calcium 7.8 (*)    Total Protein 6.0 (*)    Albumin 2.4 (*)    Alkaline Phosphatase 211 (*)    Total Bilirubin 4.0 (*)    GFR calc non Af Amer 8 (*)    GFR calc Af Amer 10 (*)    All other components within normal limits  CBC WITH DIFFERENTIAL/PLATELET - Abnormal; Notable for the following components:   RBC 3.55 (*)    Hemoglobin 10.8 (*)    HCT 32.4 (*)    Platelets 96 (*)    Lymphs Abs 0.2 (*)    Abs Immature Granulocytes 0.09 (*)    All other components within normal limits  TROPONIN I (HIGH SENSITIVITY) - Abnormal; Notable for the following components:   Troponin I (High Sensitivity) 38 (*)    All other components within normal limits  TROPONIN I (HIGH SENSITIVITY) - Abnormal; Notable for the following components:   Troponin I (High Sensitivity) 52 (*)    All other components within normal limits  C DIFFICILE QUICK SCREEN W PCR REFLEX   LIPASE, BLOOD  ETHANOL   ____________________________________________   EKG  Interpreted by me Normal sinus rhythm rate of 86, right axis, normal intervals.  Poor R wave progression.  Normal ST segments.  Slight T wave inversions in 3 and aVF, nonacute in appearance, new since prior EKG dating back January 27, 2020 and before.  Nonischemic in appearance.  ____________________________________________    RADIOLOGY  CT ABDOMEN PELVIS WO CONTRAST  Result Date: 03/21/2020 CLINICAL DATA:  Chest and abdominal pain following dialysis EXAM: CT ABDOMEN AND PELVIS WITHOUT CONTRAST TECHNIQUE: Multidetector CT imaging of the abdomen and pelvis was performed following the standard protocol without IV contrast. COMPARISON:  06/15/2019 FINDINGS: Lower chest: Bilateral pleural effusions are noted right greater than left. Some dependent atelectatic changes are seen. Mild emphysematous changes are noted as well. Small pericardial effusion is seen. Hepatobiliary: No focal liver abnormality is seen. Status post cholecystectomy. No biliary dilatation. Pancreas: Unremarkable. No pancreatic ductal dilatation or surrounding inflammatory changes. Spleen: Normal in size without focal abnormality. Adrenals/Urinary Tract: Adrenal glands are within normal limits. Bladder is partially distended. Kidneys show tiny bilateral calculi without obstructive change. Stomach/Bowel: The appendix is within normal limits. Colon and small bowel show no obstructive or inflammatory change. The stomach is within normal limits. Vascular/Lymphatic: Aortic atherosclerosis. No enlarged abdominal or pelvic lymph nodes. Reproductive: Uterus and bilateral adnexa are unremarkable. Other: Mild to moderate ascites is noted. Changes of anasarca are seen as well. Musculoskeletal: No acute or significant osseous findings. IMPRESSION: New ascites and changes of anasarca. This may be related to volume overload. Bilateral pleural effusions with basilar  atelectatic changes.  Tiny renal calculi without obstructive change. Electronically Signed   By: Inez Catalina M.D.   On: 03/21/2020 18:38   DG Chest 1 View  Result Date: 03/21/2020 CLINICAL DATA:  Chest pain.  Pain onset during dialysis. EXAM: CHEST  1 VIEW COMPARISON:  Most recent radiograph 01/27/2020. FINDINGS: Right-sided dialysis catheter tip remains in the right atrium. Cardiomegaly with unchanged mediastinal contours. Improved bilateral pleural effusions from prior exam with small residual. Vascular congestion. Possible mild pulmonary edema. No focal airspace disease. No pneumothorax. IMPRESSION: 1. Vascular congestion and possible mild pulmonary edema. Improved bilateral pleural effusions from imaging last month. 2. Stable cardiomegaly. Electronically Signed   By: Keith Rake M.D.   On: 03/21/2020 16:38   DG Shoulder 1 View Left  Result Date: 03/21/2020 CLINICAL DATA:  Left chest and shoulder pain. EXAM: LEFT SHOULDER COMPARISON:  None. FINDINGS: Single AP view of the left shoulder obtained. There is no evidence of fracture or dislocation. There is no evidence of arthropathy or other focal bone abnormality. Soft tissues are unremarkable. IMPRESSION: Negative single AP view of the left shoulder. Electronically Signed   By: Keith Rake M.D.   On: 03/21/2020 16:39    ____________________________________________   PROCEDURES Procedures  ____________________________________________  DIFFERENTIAL DIAGNOSIS   Dehydration, electrolyte abnormality, musculoskeletal chest wall strain, pulmonary embolism, viral syndrome, diverticulitis, pancreatitis, gastritis.  CLINICAL IMPRESSION / ASSESSMENT AND PLAN / ED COURSE  Medications ordered in the ED: Medications  oxyCODONE (Oxy IR/ROXICODONE) immediate release tablet 5 mg (has no administration in time range)  ondansetron (ZOFRAN) injection 4 mg (4 mg Intravenous Given 03/21/20 1553)  fentaNYL (SUBLIMAZE) injection 50 mcg (50 mcg  Intravenous Given 03/21/20 1552)  fentaNYL (SUBLIMAZE) injection 50 mcg (50 mcg Intravenous Given 03/21/20 1627)  sodium chloride 0.9 % bolus 500 mL (500 mLs Intravenous New Bag/Given 03/21/20 1800)    Pertinent labs & imaging results that were available during my care of the patient were reviewed by me and considered in my medical decision making (see chart for details).  Kathy Phelps was evaluated in Emergency Department on 03/21/2020 for the symptoms described in the history of present illness. She was evaluated in the context of the global COVID-19 pandemic, which necessitated consideration that the patient might be at risk for infection with the SARS-CoV-2 virus that causes COVID-19. Institutional protocols and algorithms that pertain to the evaluation of patients at risk for COVID-19 are in a state of rapid change based on information released by regulatory bodies including the CDC and federal and state organizations. These policies and algorithms were followed during the patient's care in the ED.   Patient presents with chest pain which is musculoskeletal in appearance.  Vital signs unremarkable, EKG not worrisome.  Due to her comorbidities and past history, I will obtain chest x-ray as well as 2 troponins to rule out acute MI.  I doubt PE dissection pneumothorax pericarditis.  She also appears to be clinically dehydrated from 5 days of diarrhea.  She reports being 8.2 kg over her dry weight.  We will check lab panel and reassess.  IV fentanyl 50 mcg for pain control and IV Zofran 4 mg for nausea relief.   ----------------------------------------- 5:15 PM on 03/21/2020 -----------------------------------------  I discussed with nephrology who checked [facility] dialysis records and advises they think that the weight change recorded today is likely an erroneous weight reading.  I will give 500 mL of IV saline for hydration to replace her enteric fluid losses.  Clinical Course as of  Mar 21 2004   Fri Mar 21, 2020  2003 Results discussed with patient including increased troponin compared to her baseline.  Recommended hospitalization the patient refuses and wishes to go home.  Advised her to call dialysis tomorrow morning for make-up session, be cautious with fluid intake, return to ED if symptoms worsen.  She has medical decision-making capacity.   [PS]    Clinical Course User Index [PS] Blaklee Mew, MD      ----------------------------------------- 8:05 PM on 03/21/2020 -----------------------------------------  CT scan unremarkable except for some ascites/anasarca.  Troponin elevated to 52 on second troponin compared to baseline of 25, but I doubt non-STEMI given his relatively low values.  Sodium of 123 I think due to GI fluid losses as she does not appear to be significantly volume overloaded clinically. ____________________________________________   FINAL CLINICAL IMPRESSION(S) / ED DIAGNOSES    Final diagnoses:  Atypical chest pain  ESRD on hemodialysis ([place])  Type 2 diabetes mellitus without complication, without long-term current use of insulin [facility])     ED Discharge Orders    None      Portions of this note were generated with dragon dictation software. Dictation errors may occur despite best attempts at proofreading.   Merriam Mew, MD 03/21/20 2006

## 2020-03-21 NOTE — ED Triage Notes (Signed)
Pt presents to er via [facility] from [address] dialysis with c/o chest pain. Pt completed 30 minutes of tx when she c/o chest pain that radiates to left shoulder. Hx of stroke with known left sided weakness. Pt is non-compliant with dialysis tx. 2 nitro sublingual given by dialysis center and 324 aspirin given in route. Pt also endorses sob with chest pain. Hx of MI.

## 2020-03-24 ENCOUNTER — Inpatient Hospital Stay: Payer: Medicare Other

## 2020-03-24 ENCOUNTER — Inpatient Hospital Stay (HOSPITAL_COMMUNITY)
Admit: 2020-03-24 | Discharge: 2020-03-24 | Disposition: A | Discharge status: Home / Self Care | Payer: Medicare Other | Attending: Cardiology | Admitting: Cardiology

## 2020-03-24 ENCOUNTER — Inpatient Hospital Stay
Admit: 2020-03-24 | Discharge: 2020-04-10 | DRG: 441 | Disposition: E | Payer: Medicare Other | Attending: Internal Medicine | Admitting: Internal Medicine

## 2020-03-24 ENCOUNTER — Encounter: Payer: Self-pay | Admitting: Medical Oncology

## 2020-03-24 ENCOUNTER — Emergency Department: Payer: Medicare Other

## 2020-03-24 ENCOUNTER — Other Ambulatory Visit: Payer: Self-pay

## 2020-03-24 DIAGNOSIS — J45909 Unspecified asthma, uncomplicated: Secondary | ICD-10-CM | POA: Diagnosis present

## 2020-03-24 DIAGNOSIS — D696 Thrombocytopenia, unspecified: Secondary | ICD-10-CM | POA: Diagnosis not present

## 2020-03-24 DIAGNOSIS — K7201 Acute and subacute hepatic failure with coma: Secondary | ICD-10-CM | POA: Diagnosis present

## 2020-03-24 DIAGNOSIS — R748 Abnormal levels of other serum enzymes: Secondary | ICD-10-CM

## 2020-03-24 DIAGNOSIS — Z992 Dependence on renal dialysis: Secondary | ICD-10-CM

## 2020-03-24 DIAGNOSIS — N186 End stage renal disease: Secondary | ICD-10-CM

## 2020-03-24 DIAGNOSIS — E1122 Type 2 diabetes mellitus with diabetic chronic kidney disease: Secondary | ICD-10-CM | POA: Diagnosis present

## 2020-03-24 DIAGNOSIS — I429 Cardiomyopathy, unspecified: Secondary | ICD-10-CM | POA: Diagnosis not present

## 2020-03-24 DIAGNOSIS — E785 Hyperlipidemia, unspecified: Secondary | ICD-10-CM | POA: Diagnosis present

## 2020-03-24 DIAGNOSIS — D689 Coagulation defect, unspecified: Secondary | ICD-10-CM | POA: Diagnosis present

## 2020-03-24 DIAGNOSIS — Z515 Encounter for palliative care: Secondary | ICD-10-CM | POA: Diagnosis not present

## 2020-03-24 DIAGNOSIS — N2581 Secondary hyperparathyroidism of renal origin: Secondary | ICD-10-CM | POA: Diagnosis present

## 2020-03-24 DIAGNOSIS — R778 Other specified abnormalities of plasma proteins: Secondary | ICD-10-CM

## 2020-03-24 DIAGNOSIS — I361 Nonrheumatic tricuspid (valve) insufficiency: Secondary | ICD-10-CM | POA: Diagnosis not present

## 2020-03-24 DIAGNOSIS — I132 Hypertensive heart and chronic kidney disease with heart failure and with stage 5 chronic kidney disease, or end stage renal disease: Secondary | ICD-10-CM | POA: Diagnosis present

## 2020-03-24 DIAGNOSIS — I5020 Unspecified systolic (congestive) heart failure: Secondary | ICD-10-CM

## 2020-03-24 DIAGNOSIS — Z7189 Other specified counseling: Secondary | ICD-10-CM | POA: Diagnosis not present

## 2020-03-24 DIAGNOSIS — Z89512 Acquired absence of left leg below knee: Secondary | ICD-10-CM | POA: Diagnosis not present

## 2020-03-24 DIAGNOSIS — D631 Anemia in chronic kidney disease: Secondary | ICD-10-CM | POA: Diagnosis present

## 2020-03-24 DIAGNOSIS — F17210 Nicotine dependence, cigarettes, uncomplicated: Secondary | ICD-10-CM | POA: Diagnosis present

## 2020-03-24 DIAGNOSIS — I69354 Hemiplegia and hemiparesis following cerebral infarction affecting left non-dominant side: Secondary | ICD-10-CM

## 2020-03-24 DIAGNOSIS — E8770 Fluid overload, unspecified: Secondary | ICD-10-CM

## 2020-03-24 DIAGNOSIS — G9340 Encephalopathy, unspecified: Secondary | ICD-10-CM | POA: Diagnosis present

## 2020-03-24 DIAGNOSIS — Z20822 Contact with and (suspected) exposure to covid-19: Secondary | ICD-10-CM | POA: Diagnosis present

## 2020-03-24 DIAGNOSIS — I351 Nonrheumatic aortic (valve) insufficiency: Secondary | ICD-10-CM | POA: Diagnosis not present

## 2020-03-24 DIAGNOSIS — G92 Toxic encephalopathy: Secondary | ICD-10-CM | POA: Diagnosis present

## 2020-03-24 DIAGNOSIS — F419 Anxiety disorder, unspecified: Secondary | ICD-10-CM | POA: Diagnosis present

## 2020-03-24 DIAGNOSIS — I214 Non-ST elevation (NSTEMI) myocardial infarction: Secondary | ICD-10-CM

## 2020-03-24 DIAGNOSIS — R4182 Altered mental status, unspecified: Secondary | ICD-10-CM

## 2020-03-24 DIAGNOSIS — Z79899 Other long term (current) drug therapy: Secondary | ICD-10-CM

## 2020-03-24 DIAGNOSIS — F209 Schizophrenia, unspecified: Secondary | ICD-10-CM | POA: Diagnosis present

## 2020-03-24 DIAGNOSIS — Z89511 Acquired absence of right leg below knee: Secondary | ICD-10-CM | POA: Diagnosis not present

## 2020-03-24 DIAGNOSIS — K219 Gastro-esophageal reflux disease without esophagitis: Secondary | ICD-10-CM | POA: Diagnosis present

## 2020-03-24 DIAGNOSIS — Z9049 Acquired absence of other specified parts of digestive tract: Secondary | ICD-10-CM

## 2020-03-24 DIAGNOSIS — I5021 Acute systolic (congestive) heart failure: Secondary | ICD-10-CM | POA: Diagnosis present

## 2020-03-24 DIAGNOSIS — I255 Ischemic cardiomyopathy: Secondary | ICD-10-CM | POA: Diagnosis present

## 2020-03-24 DIAGNOSIS — I5189 Other ill-defined heart diseases: Secondary | ICD-10-CM

## 2020-03-24 DIAGNOSIS — E872 Acidosis: Secondary | ICD-10-CM | POA: Diagnosis present

## 2020-03-24 DIAGNOSIS — Z7984 Long term (current) use of oral hypoglycemic drugs: Secondary | ICD-10-CM | POA: Diagnosis not present

## 2020-03-24 DIAGNOSIS — D6959 Other secondary thrombocytopenia: Secondary | ICD-10-CM | POA: Diagnosis present

## 2020-03-24 DIAGNOSIS — Z66 Do not resuscitate: Secondary | ICD-10-CM | POA: Diagnosis present

## 2020-03-24 DIAGNOSIS — I340 Nonrheumatic mitral (valve) insufficiency: Secondary | ICD-10-CM

## 2020-03-24 DIAGNOSIS — E875 Hyperkalemia: Secondary | ICD-10-CM | POA: Diagnosis present

## 2020-03-24 LAB — MAGNESIUM: Magnesium: 2.2 mg/dL (ref 1.7–2.4)

## 2020-03-24 LAB — BASIC METABOLIC PANEL
Anion gap: 18 — ABNORMAL HIGH (ref 5–15)
BUN: 74 mg/dL — ABNORMAL HIGH (ref 6–20)
CO2: 17 mmol/L — ABNORMAL LOW (ref 22–32)
Calcium: 7.8 mg/dL — ABNORMAL LOW (ref 8.9–10.3)
Chloride: 89 mmol/L — ABNORMAL LOW (ref 98–111)
Creatinine, Ser: 7.23 mg/dL — ABNORMAL HIGH (ref 0.44–1.00)
GFR calc Af Amer: 7 mL/min — ABNORMAL LOW (ref >60)
GFR calc non Af Amer: 6 mL/min — ABNORMAL LOW (ref >60)
Glucose, Bld: 72 mg/dL (ref 70–99)
Potassium: 5.5 mmol/L — ABNORMAL HIGH (ref 3.5–5.1)
Sodium: 124 mmol/L — ABNORMAL LOW (ref 135–145)

## 2020-03-24 LAB — LACTIC ACID, PLASMA
Lactic Acid, Venous: 2.6 mmol/L (ref 0.5–1.9)
Lactic Acid, Venous: 2.7 mmol/L (ref 0.5–1.9)
Lactic Acid, Venous: 7.1 mmol/L (ref 0.5–1.9)

## 2020-03-24 LAB — CBC WITH DIFFERENTIAL/PLATELET
Abs Immature Granulocytes: 2.09 10*3/uL — ABNORMAL HIGH (ref 0.00–0.07)
Basophils Absolute: 0.1 10*3/uL (ref 0.0–0.1)
Basophils Relative: 1 %
Eosinophils Absolute: 0 10*3/uL (ref 0.0–0.5)
Eosinophils Relative: 0 %
HCT: 30.9 % — ABNORMAL LOW (ref 36.0–46.0)
Hemoglobin: 11 g/dL — ABNORMAL LOW (ref 12.0–15.0)
Immature Granulocytes: 14 %
Lymphocytes Relative: 2 %
Lymphs Abs: 0.3 10*3/uL — ABNORMAL LOW (ref 0.7–4.0)
MCH: 30.2 pg (ref 26.0–34.0)
MCHC: 35.6 g/dL (ref 30.0–36.0)
MCV: 84.9 fL (ref 80.0–100.0)
Monocytes Absolute: 0.5 10*3/uL (ref 0.1–1.0)
Monocytes Relative: 3 %
Neutro Abs: 12.4 10*3/uL — ABNORMAL HIGH (ref 1.7–7.7)
Neutrophils Relative %: 80 %
Platelets: 22 10*3/uL — CL (ref 150–400)
RBC: 3.64 MIL/uL — ABNORMAL LOW (ref 3.87–5.11)
RDW: 15.3 % (ref 11.5–15.5)
Smear Review: NORMAL
WBC: 15.4 10*3/uL — ABNORMAL HIGH (ref 4.0–10.5)
nRBC: 0.3 % — ABNORMAL HIGH (ref 0.0–0.2)

## 2020-03-24 LAB — PROTIME-INR
INR: 1.4 — ABNORMAL HIGH (ref 0.8–1.2)
Prothrombin Time: 16.9 seconds — ABNORMAL HIGH (ref 11.4–15.2)

## 2020-03-24 LAB — GLUCOSE, CAPILLARY
Glucose-Capillary: 124 mg/dL — ABNORMAL HIGH (ref 70–99)
Glucose-Capillary: 124 mg/dL — ABNORMAL HIGH (ref 70–99)
Glucose-Capillary: 49 mg/dL — ABNORMAL LOW (ref 70–99)
Glucose-Capillary: 63 mg/dL — ABNORMAL LOW (ref 70–99)
Glucose-Capillary: 97 mg/dL (ref 70–99)

## 2020-03-24 LAB — URINALYSIS, COMPLETE (UACMP) WITH MICROSCOPIC
Bilirubin Urine: NEGATIVE
Glucose, UA: NEGATIVE mg/dL
Hgb urine dipstick: NEGATIVE
Ketones, ur: NEGATIVE mg/dL
Nitrite: NEGATIVE
Protein, ur: >=300 mg/dL — AB
Specific Gravity, Urine: 1.01 (ref 1.005–1.030)
pH: 7 (ref 5.0–8.0)

## 2020-03-24 LAB — PATHOLOGIST SMEAR REVIEW

## 2020-03-24 LAB — POTASSIUM: Potassium: 6.2 mmol/L — ABNORMAL HIGH (ref 3.5–5.1)

## 2020-03-24 LAB — BLOOD GAS, VENOUS
Acid-base deficit: 19.1 mmol/L — ABNORMAL HIGH (ref 0.0–2.0)
Bicarbonate: 6.3 mmol/L — ABNORMAL LOW (ref 20.0–28.0)
O2 Saturation: 74.3 %
Patient temperature: 37
pCO2, Ven: <19 mmHg — CL (ref 44.0–60.0)
pH, Ven: 7.23 — ABNORMAL LOW (ref 7.250–7.430)
pO2, Ven: 48 mmHg — ABNORMAL HIGH (ref 32.0–45.0)

## 2020-03-24 LAB — HEPATIC FUNCTION PANEL
ALT: 10 U/L (ref 0–44)
AST: 44 U/L — ABNORMAL HIGH (ref 15–41)
Albumin: 2.2 g/dL — ABNORMAL LOW (ref 3.5–5.0)
Alkaline Phosphatase: 252 U/L — ABNORMAL HIGH (ref 38–126)
Bilirubin, Direct: 8.4 mg/dL — ABNORMAL HIGH (ref 0.0–0.2)
Indirect Bilirubin: 3.8 mg/dL — ABNORMAL HIGH (ref 0.3–0.9)
Total Bilirubin: 12.2 mg/dL — ABNORMAL HIGH (ref 0.3–1.2)
Total Protein: 6 g/dL — ABNORMAL LOW (ref 6.5–8.1)

## 2020-03-24 LAB — ACETAMINOPHEN LEVEL: Acetaminophen (Tylenol), Serum: <10 ug/mL — ABNORMAL LOW (ref 10–30)

## 2020-03-24 LAB — FIBRIN DERIVATIVES D-DIMER (ARMC ONLY): Fibrin derivatives D-dimer (ARMC): >7500 ng/mL (FEU) — ABNORMAL HIGH (ref 0.00–499.00)

## 2020-03-24 LAB — HCG, QUANTITATIVE, PREGNANCY: hCG, Beta Chain, Quant, S: 342 m[IU]/mL — ABNORMAL HIGH (ref <5)

## 2020-03-24 LAB — AMMONIA: Ammonia: 42 umol/L — ABNORMAL HIGH (ref 9–35)

## 2020-03-24 LAB — URINE DRUG SCREEN, QUALITATIVE (ARMC ONLY)
Amphetamines, Ur Screen: NOT DETECTED
Barbiturates, Ur Screen: NOT DETECTED
Benzodiazepine, Ur Scrn: POSITIVE — AB
Cannabinoid 50 Ng, Ur ~~LOC~~: NOT DETECTED
Cocaine Metabolite,Ur ~~LOC~~: NOT DETECTED
MDMA (Ecstasy)Ur Screen: NOT DETECTED
Methadone Scn, Ur: NOT DETECTED
Opiate, Ur Screen: NOT DETECTED
Phencyclidine (PCP) Ur S: NOT DETECTED
Tricyclic, Ur Screen: NOT DETECTED

## 2020-03-24 LAB — TROPONIN I (HIGH SENSITIVITY)
Troponin I (High Sensitivity): 1706 ng/L (ref <18)
Troponin I (High Sensitivity): 1842 ng/L (ref <18)
Troponin I (High Sensitivity): 2270 ng/L (ref <18)

## 2020-03-24 LAB — FIBRINOGEN: Fibrinogen: 362 mg/dL (ref 210–475)

## 2020-03-24 LAB — ECHOCARDIOGRAM COMPLETE
Height: 48 in
Weight: 2807.78 oz

## 2020-03-24 LAB — MRSA PCR SCREENING: MRSA by PCR: NEGATIVE

## 2020-03-24 LAB — BRAIN NATRIURETIC PEPTIDE: B Natriuretic Peptide: >4500 pg/mL — ABNORMAL HIGH (ref 0.0–100.0)

## 2020-03-24 LAB — PREGNANCY, URINE: Preg Test, Ur: NEGATIVE

## 2020-03-24 LAB — SALICYLATE LEVEL: Salicylate Lvl: <7 mg/dL — ABNORMAL LOW (ref 7.0–30.0)

## 2020-03-24 LAB — ETHANOL: Alcohol, Ethyl (B): <10 mg/dL (ref <10)

## 2020-03-24 LAB — PROCALCITONIN: Procalcitonin: 5.96 ng/mL

## 2020-03-24 LAB — SARS CORONAVIRUS 2 BY RT PCR (HOSPITAL ORDER, PERFORMED IN ~~LOC~~ HOSPITAL LAB): SARS Coronavirus 2: NEGATIVE

## 2020-03-24 LAB — LIPASE, BLOOD: Lipase: 16 U/L (ref 11–51)

## 2020-03-24 MED ORDER — METRONIDAZOLE IN NACL 5-0.79 MG/ML-% IV SOLN
500.0000 mg | Freq: Once | INTRAVENOUS | Status: AC
  Administered 2020-03-24: 500 mg via INTRAVENOUS
  Filled 2020-03-24: qty 100

## 2020-03-24 MED ORDER — POLYETHYLENE GLYCOL 3350 17 G PO PACK: 17.0000 g | PACK | Freq: Every day | ORAL | Status: DC | PRN

## 2020-03-24 MED ORDER — SODIUM CHLORIDE 0.9 % IV SOLN
2.0000 g | INTRAVENOUS | Status: DC
  Administered 2020-03-24: 2 g via INTRAVENOUS
  Filled 2020-03-24: qty 20
  Filled 2020-03-24: qty 2
  Filled 2020-03-24: qty 20

## 2020-03-24 MED ORDER — SODIUM CHLORIDE 0.9% FLUSH: 3.0000 mL | INTRAVENOUS | Status: DC | PRN

## 2020-03-24 MED ORDER — DEXTROSE 50 % IV SOLN
1.0000 | Freq: Once | INTRAVENOUS | Status: AC
  Administered 2020-03-24: 50 mL via INTRAVENOUS

## 2020-03-24 MED ORDER — ACETAMINOPHEN 325 MG PO TABS: 650.0000 mg | ORAL_TABLET | ORAL | Status: DC | PRN

## 2020-03-24 MED ORDER — SODIUM CHLORIDE 0.9 % IV BOLUS
500.0000 mL | Freq: Once | INTRAVENOUS | Status: AC
  Administered 2020-03-24: 500 mL via INTRAVENOUS

## 2020-03-24 MED ORDER — SODIUM BICARBONATE-DEXTROSE 150-5 MEQ/L-% IV SOLN
150.0000 meq | INTRAVENOUS | Status: DC
  Administered 2020-03-24: 150 meq via INTRAVENOUS
  Filled 2020-03-24: qty 1000

## 2020-03-24 MED ORDER — HEPARIN SODIUM (PORCINE) 5000 UNIT/ML IJ SOLN
5000.0000 [IU] | Freq: Three times a day (TID) | INTRAMUSCULAR | Status: DC
  Administered 2020-03-24: 5000 [IU] via SUBCUTANEOUS
  Filled 2020-03-24: qty 1

## 2020-03-24 MED ORDER — SODIUM CHLORIDE 0.9 % IV SOLN: 250.0000 mL | INTRAVENOUS | Status: DC | PRN

## 2020-03-24 MED ORDER — SODIUM CHLORIDE 0.9 % IV SOLN
2.0000 g | Freq: Once | INTRAVENOUS | Status: AC
  Administered 2020-03-24: 2 g via INTRAVENOUS
  Filled 2020-03-24: qty 2

## 2020-03-24 MED ORDER — SODIUM BICARBONATE 8.4 % IV SOLN
100.0000 meq | Freq: Once | INTRAVENOUS | Status: AC
  Administered 2020-03-24: 100 meq via INTRAVENOUS

## 2020-03-24 MED ORDER — CHLORHEXIDINE GLUCONATE CLOTH 2 % EX PADS
6.0000 | MEDICATED_PAD | Freq: Every day | CUTANEOUS | Status: DC
  Administered 2020-03-25: 6 via TOPICAL

## 2020-03-24 MED ORDER — SODIUM CHLORIDE 0.9% FLUSH
3.0000 mL | Freq: Two times a day (BID) | INTRAVENOUS | Status: DC
  Administered 2020-03-24 – 2020-03-25 (×3): 3 mL via INTRAVENOUS

## 2020-03-24 MED ORDER — FAMOTIDINE IN NACL 20-0.9 MG/50ML-% IV SOLN
20.0000 mg | Freq: Two times a day (BID) | INTRAVENOUS | Status: DC
  Administered 2020-03-24 – 2020-03-25 (×3): 20 mg via INTRAVENOUS
  Filled 2020-03-24 (×3): qty 50

## 2020-03-24 MED ORDER — DOCUSATE SODIUM 100 MG PO CAPS: 100.0000 mg | ORAL_CAPSULE | Freq: Two times a day (BID) | ORAL | Status: DC | PRN

## 2020-03-24 MED ORDER — VANCOMYCIN HCL IN DEXTROSE 1-5 GM/200ML-% IV SOLN
1000.0000 mg | Freq: Once | INTRAVENOUS | Status: AC
  Administered 2020-03-24: 1000 mg via INTRAVENOUS

## 2020-03-24 MED ORDER — VANCOMYCIN HCL IN DEXTROSE 1-5 GM/200ML-% IV SOLN
1000.0000 mg | Freq: Once | INTRAVENOUS | Status: AC
  Filled 2020-03-24: qty 200

## 2020-03-24 MED ORDER — ONDANSETRON HCL 4 MG/2ML IJ SOLN: 4.0000 mg | Freq: Four times a day (QID) | INTRAMUSCULAR | Status: DC | PRN

## 2020-03-24 MED ORDER — DEXTROSE 50 % IV SOLN
50.0000 mL | Freq: Once | INTRAVENOUS | Status: AC
  Administered 2020-03-24: 50 mL via INTRAVENOUS

## 2020-03-24 NOTE — Consult Note (Signed)
Kathy Lame, MD [facility]  [address] [place], [place] [address] Phone: [phone] Fax : [phone]  Consultation  Referring Provider:     Dr. Mortimer Fries Primary Care Physician:  Sandi Mariscal, MD Primary Gastroenterologist: [facility] in [place]         Reason for Consultation:     Abnormal liver enzymes  Date of Admission:  03/20/2020 Date of Consultation:  04/05/2020         HPI:   Kathy Phelps is a 39 y.o. female who was admitted with altered mental status and a history of end-stage renal disease on dialysis.  The patient has bilateral lower extremity amputations.  Patient has an extensive past medical history and was found to have a troponin elevated at 1700 that has gone up to 2270.  The patient was also admitted with a elevated lactic acid level of 5.5 that went down to 2.3 and is now back up at 7.1. The patient's liver function was found to be elevated and therefore GI consult was called.  The patient's liver enzyme trends have shown: Component     Latest Ref Rng & Units 01/27/2020 03/21/2020 03/29/2020            AST     15 - 41 U/L 24 20 44 (H)  ALT     0 - 44 U/L '13 9 10  ' Alkaline Phosphatase     38 - 126 U/L 208 (H) 211 (H) 252 (H)  Total Bilirubin     0.3 - 1.2 mg/dL 0.9 4.0 (H) 12.2 (H)    There is no report of any hypotensive episode to explain the patient's abnormal liver enzymes.  Her hepatitis panel was also negative for acute hepatitis.  Since admission the patient has deteriorated and has been documented to have multiorgan failure and a poor prognosis.  Past Medical History:  Diagnosis Date  . Anxiety   . Asthma   . Bipolar affective ([place])   . Complete miscarriage   . Constipation   . 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 Depression   . Diabetes mellitus    Type II  . Dyspnea     " only at night and I stop breathing in my sleep too for about the last three weeks"  . GERD (gastroesophageal reflux disease)   . Headache     "migraraines"  . Hyperlipidemia   . Hypertension   . Osteomyelitis ([place])    left foot  . Pregnancy complication    HELP Syndrome  . Renal disorder   . Schizophrenia ([place])   . Stroke [facility])    " mild stroke", memory loss- approx 2017    Past Surgical History:  Procedure Laterality Date  . ABDOMINAL AORTOGRAM N/A 09/07/2017   Procedure: ABDOMINAL AORTOGRAM;  Surgeon: Waynetta Sandy, MD;  Location: [place] [facility];  Service: Cardiovascular;  Laterality: N/A;  . ABDOMINAL AORTOGRAM W/LOWER EXTREMITY N/A 02/03/2017   Procedure: Abdominal Aortogram w/Lower Extremity;  Surgeon: Waynetta Sandy, MD;  Location: [place] [facility];  Service: Cardiovascular;  Laterality: N/A;  . ABDOMINAL AORTOGRAM W/LOWER EXTREMITY N/A 06/27/2018   Procedure: ABDOMINAL AORTOGRAM W/LOWER EXTREMITY;  Surgeon: Serafina Mitchell, MD;  Location: [place] [facility];  Service: Cardiovascular;  Laterality: N/A;  . AMPUTATION Left 02/10/2017   Procedure: AMPUTATION TOES 3, 4 AND 5  LEFT FOOT;  Surgeon: Waynetta Sandy, MD;  Location: [place];  Service: Vascular;  Laterality: Left;  . AMPUTATION Left 12/21/2017   Procedure: LEFT FOOT 5TH RAY AMPUTATION;  Surgeon: Newt Minion, MD;  Location: [place];  Service: Orthopedics;  Laterality: Left;  . AMPUTATION Left 02/01/2018   Procedure: LEFT BELOW KNEE AMPUTATION;  Surgeon: Newt Minion, MD;  Location: [place];  Service: Orthopedics;  Laterality: Left;  . AMPUTATION Right 08/11/2018   Procedure: AMPUTATION BELOW KNEE;  Surgeon: Newt Minion, MD;  Location: [place];  Service: Orthopedics;  Laterality: Right;  . CESAREAN SECTION    . CHOLECYSTECTOMY    . LOWER EXTREMITY ANGIOGRAPHY Bilateral 09/07/2017   Procedure: Lower Extremity Angiography;  Surgeon: Waynetta Sandy, MD;  Location: [place] [facility];  Service: Cardiovascular;  Laterality: Bilateral;  . PERIPHERAL VASCULAR ATHERECTOMY  02/03/2017   Procedure: Peripheral Vascular Atherectomy;   Surgeon: Waynetta Sandy, MD;  Location: [place] [facility];  Service: Cardiovascular;;  . PERIPHERAL VASCULAR ATHERECTOMY Right 06/27/2018   Procedure: PERIPHERAL VASCULAR ATHERECTOMY;  Surgeon: Serafina Mitchell, MD;  Location: [place] [facility];  Service: Cardiovascular;  Laterality: Right;  superficial femoral  . PERIPHERAL VASCULAR BALLOON ANGIOPLASTY  02/03/2017   Procedure: Peripheral Vascular Balloon Angioplasty;  Surgeon: Waynetta Sandy, MD;  Location: [place] [facility];  Service: Cardiovascular;;  . PERIPHERAL VASCULAR INTERVENTION Left 09/07/2017   Procedure: PERIPHERAL VASCULAR INTERVENTION;  Surgeon: Waynetta Sandy, MD;  Location: [place] [facility];  Service: Cardiovascular;  Laterality: Left;  SFA/POPLITEAL  . STUMP REVISION Left 05/24/2018  . STUMP REVISION Left 05/24/2018   Procedure: REVISION LEFT BELOW KNEE AMPUTATION;  Surgeon: Newt Minion, MD;  Location: [place];  Service: Orthopedics;  Laterality: Left;  . TUBAL LIGATION      Prior to Admission medications   Medication Sig Start Date End Date Taking? Authorizing Provider  ALPRAZolam Duanne Moron) 1 MG tablet Take 1 mg by mouth 3 (three) times daily as needed for anxiety.  06/07/19  Yes [provider]  amLODipine (NORVASC) 5 MG tablet Take 5 mg by mouth every evening.  12/04/19  Yes [provider]  baclofen (LIORESAL) 10 MG tablet Take 10 mg by mouth 3 (three) times daily as needed. 02/28/20  Yes [provider]  cloNIDine (CATAPRES - DOSED IN MG/24 HR) 0.1 mg/24hr patch Place 0.1 mg onto the skin once a week.  01/03/20  Yes [provider]  hydrOXYzine (ATARAX/VISTARIL) 50 MG tablet Take 50 mg by mouth every 6 (six) hours as needed for anxiety.  12/27/19  Yes [provider]  LATUDA 60 MG TABS Take 60 mg by mouth at bedtime.  07/05/19  Yes [provider]  losartan (COZAAR) 50 MG tablet Take 50 mg by mouth every evening.  01/22/20  Yes [provider]  oxyCODONE-acetaminophen (PERCOCET) 10-325 MG tablet Take 1 tablet by mouth 4 (four) times daily as needed. 02/28/20  Yes [provider]  Vitamin D, Ergocalciferol, (DRISDOL) 1.25 MG (50000 UT) CAPS capsule Take 50,000 Units by mouth once a week. 07/13/19  Yes [provider]  gabapentin (NEURONTIN) 800 MG tablet Take 1 tablet (800 mg total) by mouth 2 (two) times daily. 02/11/17   Hosie Poisson, MD  oxyCODONE-acetaminophen (PERCOCET) 7.5-325 MG tablet Take 1 tablet by mouth 4 (four) times daily as needed for pain. Patient not taking: Reported on 03/28/2020 11/23/19   [provider]    Family History  Problem Relation Age of Onset  . Diabetes Mellitus II Mother   . Heart  disease Mother   . Heart disease Father      Social History   Tobacco Use  . Smoking status: Current Every Day Smoker    Packs/day: 1.00    Years: 28.00    Pack years: 28.00    Types: Cigarettes  . Smokeless tobacco: Never Used  Vaping Use  . Vaping Use: Former  Substance Use Topics  . Alcohol use: No  . Drug use: No    Allergies as of 03/17/2020 - Review Complete 03/24/2020  Allergen Reaction Noted  . Hydrocodone-acetaminophen Hives 07/16/2014  . Tylenol [acetaminophen] Itching and Swelling 02/27/2015    Review of Systems:    All systems reviewed and negative except where noted in HPI.   Physical Exam:  Vital signs in last 24 hours: Temp:  [98 F (36.7 C)-99 F (37.2 C)] 98.2 F (36.8 C) (06/14 2000) Pulse Rate:  [85-103] 87 (06/14 2100) Resp:  [9-22] 12 (06/14 2100) BP: (101-127)/(73-87) 103/74 (06/14 2100) SpO2:  [98 %-100 %] 100 % (06/14 2100) Weight:  [79.6 kg-93 kg] 79.6 kg (06/14 1254) Last BM Date:  (PTA) General:   Lethargic and unable to answer any questions Head:  Normocephalic and atraumatic. Eyes:   Positive icterus.   Conjunctiva yellow. PERRLA. Ears: Unable to assess. Neck:  Supple; no masses or thyroidomegaly Lungs: Respirations even and  unlabored. Lungs clear to auscultation bilaterally.   No wheezes, crackles, or rhonchi.  Heart:  Regular rate and rhythm;  Without murmur, clicks, rubs or gallops Abdomen:  Soft, nondistended, nontender. Normal bowel sounds. No appreciable masses or hepatomegaly.  No rebound or guarding.  Rectal:  Not performed. Msk:  Symmetrical without gross deformities.   Extremities: Bilateral amputee Neurologic: Obtunded and unable to assess. Skin:  Intact without significant lesions or rashes. Cervical Nodes:  No significant cervical adenopathy. Psych: Obtunded   LAB RESULTS: Recent Labs    04/03/2020 1033  WBC 15.4*  HGB 11.0*  HCT 30.9*  PLT 22*   BMET Recent Labs    03/28/2020 1033 04/01/2020 1956  NA 124*  --   K 5.5* 6.2*  CL 89*  --   CO2 17*  --   GLUCOSE 72  --   BUN 74*  --   CREATININE 7.23*  --   CALCIUM 7.8*  --    LFT Recent Labs    03/23/2020 1033  PROT 6.0*  ALBUMIN 2.2*  AST 44*  ALT 10  ALKPHOS 252*  BILITOT 12.2*  BILIDIR 8.4*  IBILI 3.8*   PT/INR Recent Labs    03/17/2020 1033  LABPROT 16.9*  INR 1.4*    STUDIES: CT Head Wo Contrast  Result Date: 03/20/2020 CLINICAL DATA:  Altered mental status EXAM: CT HEAD WITHOUT CONTRAST TECHNIQUE: Contiguous axial images were obtained from the base of the skull through the vertex without intravenous contrast. COMPARISON:  November 29, 2019 FINDINGS: Brain: The ventricles and sulci are normal in size and configuration. There is no intracranial mass, hemorrhage, extra-axial fluid collection, or midline shift. There are prior infarcts in each posterosuperior parietal lobe as well as in the medial occipital lobes bilaterally and in the mid right occipital lobe. No acute appearing infarct is demonstrable on this study. Vascular: No hyperdense vessels. There are is slight calcification in each carotid siphon region. Skull: The bony calvarium appears intact. Sinuses/Orbits: Visualized paranasal sinuses are clear. Orbits appear  symmetric bilaterally. Other: Mastoid air cells are clear. IMPRESSION: Scattered prior infarcts in the occipital and parietal lobes bilaterally. No acute  appearing infarct is evident. No appreciable mass or hemorrhage. Slight calcification noted in each carotid siphon. Electronically Signed   By: Lowella Grip III M.D.   On: 03/16/2020 11:04   DG Chest Portable 1 View  Result Date: 03/14/2020 CLINICAL DATA:  Altered mental status with weakness EXAM: PORTABLE CHEST 1 VIEW COMPARISON:  Three days ago FINDINGS: Cardiomegaly. Dialysis catheter on the right with tip at the right atrium. Small pleural effusions and vascular congestion. IMPRESSION: Small pleural effusions and vascular congestion. Electronically Signed   By: Monte Fantasia M.D.   On: 03/27/2020 11:10   ECHOCARDIOGRAM COMPLETE  Result Date: 04/04/2020    ECHOCARDIOGRAM REPORT   Patient Name:   NIKKO QUAST Date of Exam: 03/18/2020 Medical Rec #:  [id number]       Height:       48.0 in Accession #:    [id number]      Weight:       175.5 lb Date of Birth:  1980/12/14        BSA:          1.502 m Patient Age:    41 years        BP:           120/77 mmHg Patient Gender: F               HR:           98 bpm. Exam Location:  [facility] Procedure: 2D Echo, Cardiac Doppler and Color Doppler Indications:     Elevated troponin  History:         Patient has prior history of Echocardiogram examinations, most                  recent 11/01/2019. Stroke; Risk Factors:Hypertension, Diabetes                  and Dyslipidemia.  Sonographer:     Sherrie Sport RDCS (AE) Referring Phys:  [id number] Kate Sable Diagnosing Phys: Kate Sable MD IMPRESSIONS  1. Left ventricular ejection fraction, by estimation, is 45 to 50%. The left ventricle has mildly decreased function. The left ventricle demonstrates global hypokinesis. Left ventricular diastolic parameters are consistent with Grade II diastolic dysfunction (pseudonormalization).  2. Right ventricular systolic  function is normal. The right ventricular size is normal. There is mildly elevated pulmonary artery systolic pressure.  3. Left atrial size was mildly dilated.  4. There is a 12 x 15 mm mobile mass (clips 61 - 65) in the roof of the right atrium. This could be a thrombus or vegetation depending on clinical context.  5. The mitral valve is normal in structure. Mild to moderate mitral valve regurgitation.  6. The aortic valve is tricuspid. Aortic valve regurgitation is mild. Mild aortic valve sclerosis is present, with no evidence of aortic valve stenosis.  7. The inferior vena cava is normal in size with greater than 50% respiratory variability, suggesting right atrial pressure of 3 mmHg. FINDINGS  Left Ventricle: Left ventricular ejection fraction, by estimation, is 45 to 50%. The left ventricle has mildly decreased function. The left ventricle demonstrates global hypokinesis. The left ventricular internal cavity size was normal in size. There is  no left ventricular hypertrophy. Left ventricular diastolic parameters are consistent with Grade II diastolic dysfunction (pseudonormalization). Right Ventricle: The right ventricular size is normal. No increase in right ventricular wall thickness. Right ventricular systolic function is normal. There is mildly elevated pulmonary artery systolic pressure. The tricuspid regurgitant  velocity is 3.11  m/s, and with an assumed right atrial pressure of 3 mmHg, the estimated right ventricular systolic pressure is 50.0 mmHg. Left Atrium: Left atrial size was mildly dilated. Right Atrium: There is a 12 x 15 mm mobile mass (clips 61 - 65) in the roof of the right atrium. This could be a thrombus or vegetation depending on clinical context. Right atrial size was normal in size. Pericardium: There is no evidence of pericardial effusion. Mitral Valve: The mitral valve is normal in structure. Mild to moderate mitral valve regurgitation. Tricuspid Valve: The tricuspid valve is normal in  structure. Tricuspid valve regurgitation is mild. Aortic Valve: The aortic valve is tricuspid. Aortic valve regurgitation is mild. Aortic regurgitation PHT measures 357 msec. Mild aortic valve sclerosis is present, with no evidence of aortic valve stenosis. Aortic valve mean gradient measures 4.7 mmHg. Aortic valve peak gradient measures 8.4 mmHg. Aortic valve area, by VTI measures 2.22 cm. Pulmonic Valve: The pulmonic valve was not well visualized. Pulmonic valve regurgitation is not visualized. Aorta: The aortic root is normal in size and structure. Venous: The inferior vena cava is normal in size with greater than 50% respiratory variability, suggesting right atrial pressure of 3 mmHg. IAS/Shunts: No atrial level shunt detected by color flow Doppler.  LEFT VENTRICLE PLAX 2D LVIDd:         5.23 cm  Diastology LVIDs:         3.44 cm  LV e' lateral:   7.51 cm/s LV PW:         1.28 cm  LV E/e' lateral: 16.2 LV IVS:        0.89 cm  LV e' medial:    7.07 cm/s LVOT diam:     2.00 cm  LV E/e' medial:  17.3 LV SV:         48 LV SV Index:   32 LVOT Area:     3.14 cm  RIGHT VENTRICLE RV Basal diam:  3.09 cm RV S prime:     8.16 cm/s TAPSE (M-mode): 2.9 cm LEFT ATRIUM             Index       RIGHT ATRIUM           Index LA diam:        4.50 cm 3.00 cm/m  RA Area:     14.20 cm LA Vol (A2C):   54.1 ml 36.02 ml/m RA Volume:   33.50 ml  22.31 ml/m LA Vol (A4C):   55.9 ml 37.22 ml/m LA Biplane Vol: 56.6 ml 37.69 ml/m  AORTIC VALVE                   PULMONIC VALVE AV Area (Vmax):    1.79 cm    PV Vmax:        0.73 m/s AV Area (Vmean):   1.88 cm    PV Peak grad:   2.1 mmHg AV Area (VTI):     2.22 cm    RVOT Peak grad: 3 mmHg AV Vmax:           145.33 cm/s AV Vmean:          95.467 cm/s AV VTI:            0.215 m AV Peak Grad:      8.4 mmHg AV Mean Grad:      4.7 mmHg LVOT Vmax:         82.80 cm/s LVOT Vmean:  57.000 cm/s LVOT VTI:          0.152 m LVOT/AV VTI ratio: 0.71 AI PHT:            357 msec  AORTA Ao Root  diam: 2.90 cm MITRAL VALVE                TRICUSPID VALVE MV Area (PHT): 4.10 cm     TR Peak grad:   38.7 mmHg MV Decel Time: 185 msec     TR Vmax:        311.00 cm/s MV E velocity: 122.00 cm/s MV A velocity: 48.50 cm/s   SHUNTS MV E/A ratio:  2.52         Systemic VTI:  0.15 m                             Systemic Diam: 2.00 cm Kate Sable MD Electronically signed by Kate Sable MD Signature Date/Time: 03/19/2020/4:32:04 PM    Final    US Abdomen Limited RUQ  Result Date: 04/08/2020 CLINICAL DATA:  Multi organ failure elevated alk-phos EXAM: ULTRASOUND ABDOMEN LIMITED RIGHT UPPER QUADRANT COMPARISON:  CT 03/21/2020 FINDINGS: Gallbladder: Surgically absent Common bile duct: Diameter: 4.3 mm Liver: No focal hepatic abnormality. Slightly lobulated liver contour. Portal vein is patent on color Doppler imaging with normal direction of blood flow towards the liver. Other: Small amount of ascites in the right abdomen inferior to the liver. Incidental note made of right pleural effusion. IMPRESSION: 1. Status post cholecystectomy.  No biliary dilatation 2. Slightly lobulated liver contour, question cirrhosis. Small amount of ascites in the right upper quadrant. Right pleural effusion. Electronically Signed   By: Donavan Foil M.D.   On: 04/06/2020 20:24      Impression / Plan:   Assessment: Active Problems:   Encephalopathy   NSTEMI (non-ST elevated myocardial infarction) (HCC)   HFrEF (heart failure with reduced ejection fraction) ([place])   Altered mental status   Kathy Phelps is a 39 y.o. y/o female with elevated liver enzymes with multiorgan failure and a troponin that is significantly elevated.  The cause of the patient's abnormal liver enzymes may be due to shock liver and be hypotensive event was registered and may have occurred prior to admission.  The patient may also have portal vein thrombosis which could explain her symptoms.  Plan:  The plan for this patient who is having  multiple medical issues at the present time including an elevated troponin indicative of a heart attack with increased lactic acid, would be to see how she progresses and if she turns around in a more extensive work-up could be instituted.  At this time the patient has been made DNR/DNI.  I will follow along with you and if the patient's condition turns around then further investigation can be initiated at that time.  Thank you for involving me in the care of this patient.      LOS: 0 days   Kathy Lame, MD  03/31/2020, 9:41 PM Pager [phone] 7am-5pm  Check AMION for 5pm -7am coverage and on weekends   Note: This dictation was prepared with Dragon dictation along with smaller phrase technology. Any transcriptional errors that result from this process are unintentional.

## 2020-03-24 NOTE — Progress Notes (Signed)
Returned phone call to ED nurse, will call back.

## 2020-03-24 NOTE — ED Notes (Signed)
ICU called nurse unable to take reports will call called

## 2020-03-24 NOTE — Care Plan (Addendum)
Plan of care:  Unable to reach [facility], and unable to leave message at number listed. Will continue to follow. CM working on other contacts.  ADDENDUM:  Charge RN with phone number for biological sister Kathy Phelps [phone]. Demographics updated and message left.   ADDENDUM: Phone call returned by Kathy Phelps. Kathy Phelps states she is the biological sister of Kathy Phelps, and she and Kathy Phelps had an additional sibling, but the sibling died. She states their parents are deceased as well. Kathy Phelps states Kathy Phelps is "the babies' daddy." She would like to be Kathy Phelps planner for her sister as NOK. She will be here this afternoon to speak with CCM.

## 2020-03-24 NOTE — Consult Note (Addendum)
Cardiology Consultation:   Patient ID: Kathy Phelps MRN: [id number]; DOB: 1981-03-05  Admit date: 03/31/2020 Date of Consult: 03/15/2020  Primary Care Provider: Sandi Mariscal, MD [facility] Cardiologist: [place] [facility] Electrophysiologist:  None    Patient Profile:   Kathy Phelps is a 39 y.o. female with a hx of end-stage renal disease on hemodialysis, bilateral BKA who is being seen today for the evaluation of elevated troponins at the request of Dr. Mortimer Fries.  History of Present Illness:   Information obtained from chart review, staff due to condition of patient.  Kathy Phelps is a 39 year old female with history of anxiety, diabetes, hypertension, ESRD on dialysis MWF, bilateral BKA, CVA with left-sided weakness presenting to the hospital due to altered mental status.  Apparently patient was found at a local motel with 2 children.  Patient not able to obtain history.  Upon EMS arrival, patient was not coherent.  Patient was seen in the emergency room 3 days ago due to chest pain and shortness of breath.  Evaluation with blood work revealed mild troponin elevation.  At the time, she also endorsed being sick for couple of days associated with occasional vomiting with oral intake.  She had missed at least 2 sessions of dialysis at the time due to diarrhea.  Patient was hyponatremic with sodium of 123.  Hospitalization was recommended by ED staff, but patient refused and wanted to go home.  In the ED today, lab work showed elevated troponins with initial value 1706, next value 1842.  BNP >4500, Albumin was low, LFTs elevated, platelets  low at 22, ammonia level elevated at 42, potassium 5.5.  EKG showed sinus tachycardia.  Head CT with no acute change, old infarcts in the occipital and parietal lobes   Past Medical History:  Diagnosis Date  . Anxiety   . Asthma   . Bipolar affective ([place])   . Complete miscarriage   . Constipation   . Dehiscence of amputation  stump ([facility])    left below knee  . Depression   . Diabetes mellitus    Type II  . Dyspnea     " only at night and I stop breathing in my sleep too for about the last three weeks"  . GERD (gastroesophageal reflux disease)   . Headache    "migraraines"  . Hyperlipidemia   . Hypertension   . Osteomyelitis ([place])    left foot  . Pregnancy complication    HELP Syndrome  . Renal disorder   . Schizophrenia ([place])   . Stroke [facility])    " mild stroke", memory loss- approx 2017    Past Surgical History:  Procedure Laterality Date  . ABDOMINAL AORTOGRAM N/A 09/07/2017   Procedure: ABDOMINAL AORTOGRAM;  Surgeon: Waynetta Sandy, MD;  Location: [place] [facility];  Service: Cardiovascular;  Laterality: N/A;  . ABDOMINAL AORTOGRAM W/LOWER EXTREMITY N/A 02/03/2017   Procedure: Abdominal Aortogram w/Lower Extremity;  Surgeon: Waynetta Sandy, MD;  Location: [place] [facility];  Service: Cardiovascular;  Laterality: N/A;  . ABDOMINAL AORTOGRAM W/LOWER EXTREMITY N/A 06/27/2018   Procedure: ABDOMINAL AORTOGRAM W/LOWER EXTREMITY;  Surgeon: Serafina Mitchell, MD;  Location: [place] [facility];  Service: Cardiovascular;  Laterality: N/A;  . AMPUTATION Left 02/10/2017   Procedure: AMPUTATION TOES 3, 4 AND 5  LEFT FOOT;  Surgeon: Waynetta Sandy, MD;  Location: [place];  Service: Vascular;  Laterality: Left;  . AMPUTATION Left 12/21/2017   Procedure: LEFT FOOT 5TH RAY  AMPUTATION;  Surgeon: Newt Minion, MD;  Location: [place];  Service: Orthopedics;  Laterality: Left;  . AMPUTATION Left 02/01/2018   Procedure: LEFT BELOW KNEE AMPUTATION;  Surgeon: Newt Minion, MD;  Location: [place];  Service: Orthopedics;  Laterality: Left;  . AMPUTATION Right 08/11/2018   Procedure: AMPUTATION BELOW KNEE;  Surgeon: Newt Minion, MD;  Location: [place];  Service: Orthopedics;  Laterality: Right;  . CESAREAN SECTION    . CHOLECYSTECTOMY    . LOWER EXTREMITY ANGIOGRAPHY Bilateral 09/07/2017   Procedure:  Lower Extremity Angiography;  Surgeon: Waynetta Sandy, MD;  Location: [place] [facility];  Service: Cardiovascular;  Laterality: Bilateral;  . PERIPHERAL VASCULAR ATHERECTOMY  02/03/2017   Procedure: Peripheral Vascular Atherectomy;  Surgeon: Waynetta Sandy, MD;  Location: [place] [facility];  Service: Cardiovascular;;  . PERIPHERAL VASCULAR ATHERECTOMY Right 06/27/2018   Procedure: PERIPHERAL VASCULAR ATHERECTOMY;  Surgeon: Serafina Mitchell, MD;  Location: [place] [facility];  Service: Cardiovascular;  Laterality: Right;  superficial femoral  . PERIPHERAL VASCULAR BALLOON ANGIOPLASTY  02/03/2017   Procedure: Peripheral Vascular Balloon Angioplasty;  Surgeon: Waynetta Sandy, MD;  Location: [place] [facility];  Service: Cardiovascular;;  . PERIPHERAL VASCULAR INTERVENTION Left 09/07/2017   Procedure: PERIPHERAL VASCULAR INTERVENTION;  Surgeon: Waynetta Sandy, MD;  Location: [place] [facility];  Service: Cardiovascular;  Laterality: Left;  SFA/POPLITEAL  . STUMP REVISION Left 05/24/2018  . STUMP REVISION Left 05/24/2018   Procedure: REVISION LEFT BELOW KNEE AMPUTATION;  Surgeon: Newt Minion, MD;  Location: [place];  Service: Orthopedics;  Laterality: Left;  . TUBAL LIGATION       Home Medications:  Prior to Admission medications   Medication Sig Start Date End Date Taking? Authorizing Provider  ALPRAZolam Duanne Moron) 1 MG tablet Take 1 mg by mouth 3 (three) times daily as needed for anxiety.  06/07/19  Yes [provider]  amLODipine (NORVASC) 5 MG tablet Take 5 mg by mouth every evening.  12/04/19  Yes [provider]  baclofen (LIORESAL) 10 MG tablet Take 10 mg by mouth 3 (three) times daily as needed. 02/28/20  Yes [provider]  cloNIDine (CATAPRES - DOSED IN MG/24 HR) 0.1 mg/24hr patch Place 0.1 mg onto the skin once a week.  01/03/20  Yes [provider]  hydrOXYzine (ATARAX/VISTARIL) 50 MG tablet Take 50 mg by mouth every  6 (six) hours as needed for anxiety.  12/27/19  Yes [provider]  LATUDA 60 MG TABS Take 60 mg by mouth at bedtime.  07/05/19  Yes [provider]  losartan (COZAAR) 50 MG tablet Take 50 mg by mouth every evening.  01/22/20  Yes [provider]  oxyCODONE-acetaminophen (PERCOCET) 10-325 MG tablet Take 1 tablet by mouth 4 (four) times daily as needed. 02/28/20  Yes [provider]  Vitamin D, Ergocalciferol, (DRISDOL) 1.25 MG (50000 UT) CAPS capsule Take 50,000 Units by mouth once a week. 07/13/19  Yes [provider]  gabapentin (NEURONTIN) 800 MG tablet Take 1 tablet (800 mg total) by mouth 2 (two) times daily. 02/11/17   Hosie Poisson, MD  oxyCODONE-acetaminophen (PERCOCET) 7.5-325 MG tablet Take 1 tablet by mouth 4 (four) times daily as needed for pain. Patient not taking: Reported on 03/12/2020 11/23/19   [provider]    Inpatient Medications: Scheduled Meds: . [START ON 04-06-20] Chlorhexidine Gluconate Cloth  6 each Topical Q0600  . heparin  5,000 Units Subcutaneous Q8H  . sodium chloride  flush  3 mL Intravenous Q12H   Continuous Infusions: . sodium chloride    . cefTRIAXone (ROCEPHIN)  IV    . famotidine (PEPCID) IV 20 mg (03/22/2020 1410)  . vancomycin     PRN Meds: sodium chloride, acetaminophen, docusate sodium, ondansetron (ZOFRAN) IV, polyethylene glycol, sodium chloride flush  Allergies:    Allergies  Allergen Reactions  . Hydrocodone-Acetaminophen Hives  . Tylenol [Acetaminophen] Itching and Swelling    Patient tolerated APAP 650 mg (06/29/18) as well as oxycodone/APAP 5-325 mg during 06/25/2018 admission ??    Social History:   Social History   Socioeconomic History  . Marital status: Single    Spouse name: Not on file  . Number of children: Not on file  . Years of education: Not on file  . Highest education level: Not on file  Occupational History  . Not on file  Tobacco Use  . Smoking status: Current Every  Day Smoker    Packs/day: 1.00    Years: 28.00    Pack years: 28.00    Types: Cigarettes  . Smokeless tobacco: Never Used  Vaping Use  . Vaping Use: Former  Substance and Sexual Activity  . Alcohol use: No  . Drug use: No  . Sexual activity: Not on file  Other Topics Concern  . Not on file  Social History Narrative  . Not on file   Social Determinants of Health   Financial Resource Strain:   . Difficulty of Paying Living Expenses:   Food Insecurity:   . Worried About Charity fundraiser in the Last Year:   . Arboriculturist in the Last Year:   Transportation Needs:   . Film/video editor (Medical):   Marland Kitchen Lack of Transportation (Non-Medical):   Physical Activity:   . Days of Exercise per Week:   . Minutes of Exercise per Session:   Stress:   . Feeling of Stress :   Social Connections:   . Frequency of Communication with Friends and Family:   . Frequency of Social Gatherings with Friends and Family:   . Attends Religious Services:   . Active Member of Clubs or Organizations:   . Attends Archivist Meetings:   Marland Kitchen Marital Status:   Intimate Partner Violence:   . Fear of Current or Ex-Partner:   . Emotionally Abused:   Marland Kitchen Physically Abused:   . Sexually Abused:     Family History:    Family History  Problem Relation Age of Onset  . Diabetes Mellitus II Mother   . Heart disease Mother   . Heart disease Father      ROS:  Please see the history of present illness.   All other ROS reviewed and negative.     Physical Exam/Data:   Vitals:   03/21/2020 1139 04/06/2020 1230 03/21/2020 1254 03/23/2020 1300  BP: 124/81 119/73 120/82 120/77  Pulse: 99 98  98  Resp: (!) 22 14 12 17   Temp: 99 F (37.2 C)  98 F (36.7 C)   TempSrc: Rectal  Axillary   SpO2: 98% 100% 99% 99%  Weight:   79.6 kg   Height:   4' (1.219 m)     Intake/Output Summary (Last 24 hours) at 03/21/2020 1424 Last data filed at 03/26/2020 1410 Gross per 24 hour  Intake 103 ml  Output --   Net 103 ml   Last 3 Weights 03/27/2020 04/02/2020 03/21/2020  Weight (lbs) 175 lb 7.8 oz 205 lb 0.4 oz  205 lb 7.5 oz  Weight (kg) 79.6 kg 93 kg 93.2 kg     Body mass index is 53.55 kg/m.  General: Incoherent speech, appears jaundiced HEENT: Sclera appears icteric Lymph: no adenopathy Neck: no JVD Endocrine:  No thryomegaly Vascular: No carotid bruits; FA pulses 2+ bilaterally without bruits  Cardiac: Tachycardic, no murmurs Lungs: Poor inspiratory effort, decreased breath sounds at bases Abd: soft, nontender, no hepatomegaly  Ext: Bilateral BKA Musculoskeletal: Bilateral BKA noted Skin: warm and dry  Neuro: Altered, Psych: Unable to assess  EKG:  The EKG was personally reviewed and demonstrates: Sinus tachycardia Telemetry:  Telemetry was personally reviewed and demonstrates: Sinus tachycardia  Relevant CV Studies: Echocardiogram 10/2019 1. Left ventricular ejection fraction, by visual estimation, is 45 to  50%. The left ventricle has low normal function. There is mildly increased  left ventricular hypertrophy.  2. The left ventricle has no regional wall motion abnormalities.  3. Global right ventricle has normal systolic function.The right  ventricular size is normal. No increase in right ventricular wall  thickness.  4. Left atrial size was normal.  5. Right atrial size was normal.  6. The mitral valve is normal in structure. No evidence of mitral valve  regurgitation.  7. The tricuspid valve is normal in structure.  8. The tricuspid valve is normal in structure. Tricuspid valve  regurgitation is trivial.  9. The aortic valve is normal in structure. Aortic valve regurgitation is  not visualized.  10. The pulmonic valve was grossly normal. Pulmonic valve regurgitation is  not visualized.  11. Moderately elevated pulmonary artery systolic pressure.  Laboratory Data:  High Sensitivity Troponin:   Recent Labs  Lab 03/21/20 1504 03/21/20 1856 03/16/2020 1033  03/15/2020 1311  TROPONINIHS 38* 52* 1,706* 1,842*     Chemistry Recent Labs  Lab 03/21/20 1504 04/04/2020 1033  NA 123* 124*  K 4.9 5.5*  CL 89* 89*  CO2 20* 17*  GLUCOSE 101* 72  BUN 47* 74*  CREATININE 5.83* 7.23*  CALCIUM 7.8* 7.8*  GFRNONAA 8* 6*  GFRAA 10* 7*  ANIONGAP 14 18*    Recent Labs  Lab 03/21/20 1504 03/17/2020 1033  PROT 6.0* 6.0*  ALBUMIN 2.4* 2.2*  AST 20 44*  ALT 9 10  ALKPHOS 211* 252*  BILITOT 4.0* 12.2*   Hematology Recent Labs  Lab 03/21/20 1504 03/23/2020 1033  WBC 6.7 15.4*  RBC 3.55* 3.64*  HGB 10.8* 11.0*  HCT 32.4* 30.9*  MCV 91.3 84.9  MCH 30.4 30.2  MCHC 33.3 35.6  RDW 14.5 15.3  PLT 96* 22*   BNP Recent Labs  Lab 03/31/2020 1033  BNP >4,500.0*    DDimer No results for input(s): DDIMER in the last 168 hours.   Radiology/Studies:  CT ABDOMEN PELVIS WO CONTRAST  Result Date: 03/21/2020 CLINICAL DATA:  Chest and abdominal pain following dialysis EXAM: CT ABDOMEN AND PELVIS WITHOUT CONTRAST TECHNIQUE: Multidetector CT imaging of the abdomen and pelvis was performed following the standard protocol without IV contrast. COMPARISON:  06/15/2019 FINDINGS: Lower chest: Bilateral pleural effusions are noted right greater than left. Some dependent atelectatic changes are seen. Mild emphysematous changes are noted as well. Small pericardial effusion is seen. Hepatobiliary: No focal liver abnormality is seen. Status post cholecystectomy. No biliary dilatation. Pancreas: Unremarkable. No pancreatic ductal dilatation or surrounding inflammatory changes. Spleen: Normal in size without focal abnormality. Adrenals/Urinary Tract: Adrenal glands are within normal limits. Bladder is partially distended. Kidneys show tiny bilateral calculi without obstructive change. Stomach/Bowel: The appendix  is within normal limits. Colon and small bowel show no obstructive or inflammatory change. The stomach is within normal limits. Vascular/Lymphatic: Aortic  atherosclerosis. No enlarged abdominal or pelvic lymph nodes. Reproductive: Uterus and bilateral adnexa are unremarkable. Other: Mild to moderate ascites is noted. Changes of anasarca are seen as well. Musculoskeletal: No acute or significant osseous findings. IMPRESSION: New ascites and changes of anasarca. This may be related to volume overload. Bilateral pleural effusions with basilar atelectatic changes. Tiny renal calculi without obstructive change. Electronically Signed   By: Inez Catalina M.D.   On: 03/21/2020 18:38   DG Chest 1 View  Result Date: 03/21/2020 CLINICAL DATA:  Chest pain.  Pain onset during dialysis. EXAM: CHEST  1 VIEW COMPARISON:  Most recent radiograph 01/27/2020. FINDINGS: Right-sided dialysis catheter tip remains in the right atrium. Cardiomegaly with unchanged mediastinal contours. Improved bilateral pleural effusions from prior exam with small residual. Vascular congestion. Possible mild pulmonary edema. No focal airspace disease. No pneumothorax. IMPRESSION: 1. Vascular congestion and possible mild pulmonary edema. Improved bilateral pleural effusions from imaging last month. 2. Stable cardiomegaly. Electronically Signed   By: Keith Rake M.D.   On: 03/21/2020 16:38   DG Shoulder 1 View Left  Result Date: 03/21/2020 CLINICAL DATA:  Left chest and shoulder pain. EXAM: LEFT SHOULDER COMPARISON:  None. FINDINGS: Single AP view of the left shoulder obtained. There is no evidence of fracture or dislocation. There is no evidence of arthropathy or other focal bone abnormality. Soft tissues are unremarkable. IMPRESSION: Negative single AP view of the left shoulder. Electronically Signed   By: Keith Rake M.D.   On: 03/21/2020 16:39   CT Head Wo Contrast  Result Date: 03/20/2020 CLINICAL DATA:  Altered mental status EXAM: CT HEAD WITHOUT CONTRAST TECHNIQUE: Contiguous axial images were obtained from the base of the skull through the vertex without intravenous contrast.  COMPARISON:  November 29, 2019 FINDINGS: Brain: The ventricles and sulci are normal in size and configuration. There is no intracranial mass, hemorrhage, extra-axial fluid collection, or midline shift. There are prior infarcts in each posterosuperior parietal lobe as well as in the medial occipital lobes bilaterally and in the mid right occipital lobe. No acute appearing infarct is demonstrable on this study. Vascular: No hyperdense vessels. There are is slight calcification in each carotid siphon region. Skull: The bony calvarium appears intact. Sinuses/Orbits: Visualized paranasal sinuses are clear. Orbits appear symmetric bilaterally. Other: Mastoid air cells are clear. IMPRESSION: Scattered prior infarcts in the occipital and parietal lobes bilaterally. No acute appearing infarct is evident. No appreciable mass or hemorrhage. Slight calcification noted in each carotid siphon. Electronically Signed   By: Lowella Grip III M.D.   On: 03/21/2020 11:04   DG Chest Portable 1 View  Result Date: 03/23/2020 CLINICAL DATA:  Altered mental status with weakness EXAM: PORTABLE CHEST 1 VIEW COMPARISON:  Three days ago FINDINGS: Cardiomegaly. Dialysis catheter on the right with tip at the right atrium. Small pleural effusions and vascular congestion. IMPRESSION: Small pleural effusions and vascular congestion. Electronically Signed   By: Monte Fantasia M.D.   On: 04/02/2020 11:10          Assessment and Plan:   1.  Elevated troponins, NSTEMI -Metabolic encephalopathy, elevated troponins, -Continue to trend troponins until peaked -Anticoagulants, antiplatelets contraindicated in the setting of abnormal LFTs, coagulopathy, thrombocytopenia -Invasive strategy also not indicated due to above -Recommend repeat echocardiogram -If neuro function improves and LFTs, coagulopathy/platelets normalize, may consider noninvasive/stress testing  2.  End-stage renal disease, -Abnormal potassium, elevated ammonia  levels -HD as per renal  3.  Altered mental status, metabolic encephalopathy, multiorgan failure -Management as per ICU team  4.  Mildly reduced LV dysfunction -.  Repeat echo as above due to acute clinical change -Small pleural effusions and vascular congestion on chest x-ray -Hemodialysis as per nephrology.   Signed, Kate Sable, MD  03/17/2020 2:24 PM   Addendum  Echo showed a 12x52mm RA mass suspicious of thrombus vs vegetation. Patient currently on abx. Cannot anticogulate due to coagulopathy as mentioned above.

## 2020-03-24 NOTE — H&P (Addendum)
Name: Kathy Phelps MRN: [id number] DOB: Dec 30, 1980     CONSULTATION DATE: 04/05/2020  REFERRING MD :Cherylann Banas  CHIEF COMPLAINT: mental status changes    HISTORY OF PRESENT ILLNESS:   39 y.o. female with PMH as noted below including ESRD on dialysis, diabetes, bilateral BKA's, and other PMH as noted below who presents with altered mental status.    Per EMS, the patient was at a local motel with 2 children.    It is unknown when the patient last had dialysis.  The patient herself is unable to give any history Patient with [place]  I CALLED MULTIPLE PHONE NUMBERS NO ANSWER  +JAUNDICE EVERYWHERE TB 12 WBC 15 BUN 74 TROP 1700 BNP 4500 LA 2.7  Patient arrives to ICU obtunded     BMP Latest Ref Rng & Units 03/22/2020 03/21/2020 01/27/2020  Glucose 70 - 99 mg/dL 72 101(H) 123(H)  BUN 6 - 20 mg/dL 74(H) 47(H) 75(H)  Creatinine 0.44 - 1.00 mg/dL 7.23(H) 5.83(H) 8.17(H)  Sodium 135 - 145 mmol/L 124(L) 123(L) 131(L)  Potassium 3.5 - 5.1 mmol/L 5.5(H) 4.9 6.1(H)  Chloride 98 - 111 mmol/L 89(L) 89(L) 102  CO2 22 - 32 mmol/L 17(L) 20(L) 13(L)  Calcium 8.9 - 10.3 mg/dL 7.8(L) 7.8(L) 7.4(L)      PAST MEDICAL HISTORY :   has a past medical history of Anxiety, Asthma, Bipolar affective ([place]), Complete miscarriage, Constipation, Dehiscence of amputation stump ([place]), Depression, Diabetes mellitus, Dyspnea, GERD (gastroesophageal reflux disease), Headache, Hyperlipidemia, Hypertension, Osteomyelitis ([place]), Pregnancy complication, Renal disorder, Schizophrenia ([place]), and Stroke ([place]).  has a past surgical history that includes Cesarean section; Cholecystectomy; Tubal ligation; ABDOMINAL AORTOGRAM W/LOWER EXTREMITY (N/A, 02/03/2017); PERIPHERAL VASCULAR ATHERECTOMY (02/03/2017); PERIPHERAL VASCULAR BALLOON ANGIOPLASTY (02/03/2017); Amputation (Left, 02/10/2017); ABDOMINAL AORTOGRAM (N/A, 09/07/2017); Lower Extremity Angiography (Bilateral, 09/07/2017);  PERIPHERAL VASCULAR INTERVENTION (Left, 09/07/2017); Amputation (Left, 12/21/2017); Amputation (Left, 02/01/2018); Stump revision (Left, 05/24/2018); Stump revision (Left, 05/24/2018); ABDOMINAL AORTOGRAM W/LOWER EXTREMITY (N/A, 06/27/2018); PERIPHERAL VASCULAR ATHERECTOMY (Right, 06/27/2018); and Amputation (Right, 08/11/2018). Prior to Admission medications   Medication Sig Start Date End Date Taking? Authorizing Provider  ALPRAZolam Duanne Moron) 1 MG tablet Take 1 mg by mouth 3 (three) times daily as needed for anxiety.  06/07/19   [provider]  amLODipine (NORVASC) 5 MG tablet Take 5 mg by mouth every evening.  12/04/19   [provider]  baclofen (LIORESAL) 10 MG tablet Take 10 mg by mouth 3 (three) times daily as needed. 02/28/20   [provider]  cloNIDine (CATAPRES - DOSED IN MG/24 HR) 0.1 mg/24hr patch Place 0.1 mg onto the skin once a week.  01/03/20   [provider]  gabapentin (NEURONTIN) 800 MG tablet Take 1 tablet (800 mg total) by mouth 2 (two) times daily. 02/11/17   Hosie Poisson, MD  hydrOXYzine (ATARAX/VISTARIL) 50 MG tablet Take 50 mg by mouth every 6 (six) hours as needed for anxiety.  12/27/19   [provider]  LATUDA 60 MG TABS Take 60 mg by mouth at bedtime.  07/05/19   [provider]  losartan (COZAAR) 50 MG tablet Take 50 mg by mouth every evening.  01/22/20   [provider]  oxyCODONE-acetaminophen (PERCOCET) 10-325 MG tablet Take 1 tablet by mouth 4 (four) times daily as needed. 02/28/20   [provider]  oxyCODONE-acetaminophen (PERCOCET) 7.5-325 MG tablet Take 1 tablet by mouth 4 (four) times daily as needed for pain. 11/23/19   [provider]  Vitamin D,  Ergocalciferol, (DRISDOL) 1.25 MG (50000 UT) CAPS capsule Take 50,000 Units by mouth once a week. 07/13/19   [provider]   Allergies  Allergen Reactions  . Hydrocodone-Acetaminophen Hives  . Tylenol [Acetaminophen] Itching and Swelling     Patient tolerated APAP 650 mg (06/29/18) as well as oxycodone/APAP 5-325 mg during 06/25/2018 admission ??    FAMILY HISTORY:  family history includes Diabetes Mellitus II in her mother; Heart disease in her father and mother. SOCIAL HISTORY:  reports that she has been smoking cigarettes. She has a 28.00 pack-year smoking history. She has never used smokeless tobacco. She reports that she does not drink alcohol and does not use drugs.  REVIEW OF SYSTEMS:   Unable to obtain due to critical illness      Estimated body mass index is 53.55 kg/m as calculated from the following:   Height as of this encounter: 4' (1.219 m).   Weight as of this encounter: 79.6 kg.    VITAL SIGNS: Temp:  [98 F (36.7 C)-99 F (37.2 C)] 98 F (36.7 C) (06/14 1254) Pulse Rate:  [98-102] 98 (06/14 1300) Resp:  [12-22] 17 (06/14 1300) BP: (119-126)/(73-87) 120/77 (06/14 1300) SpO2:  [98 %-100 %] 99 % (06/14 1300) Weight:  [79.6 kg-93 kg] 79.6 kg (06/14 1254)   No intake/output data recorded. Total I/O In: 100 [IV Piggyback:100] Out: -    SpO2: 99 %   Physical Examination:  GENERAL:critically ill appearing, +resp distress +jaundice HEAD: Normocephalic, atraumatic.  EYES:   + scleral icterus.  MOUTH: Moist mucosal membrane. NECK: Supple. No JVD.  PULMONARY: +rhonchi, +wheezing CARDIOVASCULAR: S1 and S2. Regular rate and rhythm. No murmurs, rubs, or gallops.  GASTROINTESTINAL: Soft, nontender, -distended.  Positive bowel sounds.  MUSCULOSKELETAL: b/l LE amputee NEUROLOGIC: obtunded SKIN:intact,warm,dry  I personally reviewed lab work that was obtained in last 24 hrs. CXR Independently reviewed-effusions, mild edema  MEDICATIONS: I have reviewed all medications and confirmed regimen as documented   CULTURE RESULTS   No results found for this or any previous visit (from the past 240 hour(s)).        IMAGING    CT Head Wo Contrast  Result Date: 03/15/2020 CLINICAL DATA:  Altered  mental status EXAM: CT HEAD WITHOUT CONTRAST TECHNIQUE: Contiguous axial images were obtained from the base of the skull through the vertex without intravenous contrast. COMPARISON:  November 29, 2019 FINDINGS: Brain: The ventricles and sulci are normal in size and configuration. There is no intracranial mass, hemorrhage, extra-axial fluid collection, or midline shift. There are prior infarcts in each posterosuperior parietal lobe as well as in the medial occipital lobes bilaterally and in the mid right occipital lobe. No acute appearing infarct is demonstrable on this study. Vascular: No hyperdense vessels. There are is slight calcification in each carotid siphon region. Skull: The bony calvarium appears intact. Sinuses/Orbits: Visualized paranasal sinuses are clear. Orbits appear symmetric bilaterally. Other: Mastoid air cells are clear. IMPRESSION: Scattered prior infarcts in the occipital and parietal lobes bilaterally. No acute appearing infarct is evident. No appreciable mass or hemorrhage. Slight calcification noted in each carotid siphon. Electronically Signed   By: Lowella Grip III M.D.   On: 04/09/2020 11:04   DG Chest Portable 1 View  Result Date: 03/16/2020 CLINICAL DATA:  Altered mental status with weakness EXAM: PORTABLE CHEST 1 VIEW COMPARISON:  Three days ago FINDINGS: Cardiomegaly. Dialysis catheter on the right with tip at the right atrium. Small pleural effusions and vascular congestion. IMPRESSION: Small pleural effusions  and vascular congestion. Electronically Signed   By: Monte Fantasia M.D.   On: 04/06/2020 11:10      ASSESSMENT AND PLAN SYNOPSIS  39 yo female admitted to ICU for severe encephalopathy from acute liver failure complicated by end stage renal failure and acute cardiac CHF and acute NSTEMI    NEUROLOGY-h/o CVA's Acute toxic metabolic encephalopathy Due to acute liver failure  ACUTE NSTEMI Cardiology to be consulted  ACUTE SYSTOLIC CARDIAC FAILURE- EF  40% -follow up cardiac enzymes as indicated -follow up cardiology recs    END STAGE KIDNEY INJURY/Renal Failure -continue Foley Catheter-assess need -Avoid nephrotoxic agents -Follow BMP -Ensure adequate renal perfusion, optimize oxygenation -Renal dose medications Follow up Nephrology recs  CARDIAC ICU monitoring  ID -continue IV abx as prescibed -follow up cultures  GI GI PROPHYLAXIS as indicated  NUTRITIONAL STATUS DIET-->NPO Constipation protocol as indicated   ENDO - will use ICU hypoglycemic\Hyperglycemia protocol if needed    ELECTROLYTES -follow labs as needed -replace as needed -pharmacy consultation and following    DVT/GI PRX ordered and assessed TRANSFUSIONS AS NEEDED MONITOR FSBS I Assessed the need for Labs I Assessed the need for Foley I Assessed the need for Central Venous Line Family Discussion when available I Assessed the need for Mobilization I made an Assessment of medications to be adjusted accordingly Safety Risk assessment Completed  CASE DISCUSSED IN MULTIDISCIPLINARY ROUNDS WITH ICU TEAM   Critical Care Time devoted to patient care services described in this note is 56 minutes.   Overall, patient is critically ill, prognosis is guarded.  Patient with Multiorgan failure and at high risk for cardiac arrest and death.   Patient with very poor prognosis, multiorgan failure.    Corrin Parker, M.D.  [facility]  Medical Director [place] Director [facility] Cardio-Pulmonary Department

## 2020-03-24 NOTE — ED Notes (Signed)
Report given to receiving nurse. Patient to ICU bed #2 via stretcher and ED nurse.

## 2020-03-24 NOTE — TOC Initial Note (Signed)
Transition of Care [facility]) - Initial/Assessment Note    Patient Details  Name: Kathy Phelps MRN: [id number] Date of Birth: 02/04/1981  Transition of [facility]) CM/SW Contact:    Shelbie Ammons, RN Phone Number: 03/16/2020, 2:25 PM  Clinical Narrative:    RNCM contacted by charge nurse and attending due to inability to locate adult next of kin. Attempted to contact sister Kathy Phelps but went to voicemail, per chart review noted that this is actually a close family friend. Placed call to patient's daughter Kathy Phelps to discuss need for adult contact. She reported that she didn't have any other family and that we could call [name] at [phone] and that he and the patient are still together.   LCSW, Teresita placed call and spoke with [place] patient's significant other. He reported that patient has no other living relatives and that he and patient have been together for years and he is the father of her 3 children.   RNCM notified attending MD of above information.                        Patient Goals and CMS Choice        Expected Discharge Plan and Services                                                Prior Living Arrangements/Services                       Activities of Daily Living      Permission Sought/Granted                  Emotional Assessment              Admission diagnosis:  Encephalopathy [G93.40] NSTEMI (non-ST elevated myocardial infarction) ([place]) [I21.4] Altered mental status, unspecified altered mental status type [R41.82] Hypervolemia, unspecified hypervolemia type [E87.70] Acute liver failure with hepatic coma ([place]) [K72.01] Patient Active Problem List   Diagnosis Date Noted  . Encephalopathy 03/17/2020  . Seizure ([place]) 11/29/2019  . ESRD on hemodialysis ([place]) 10/31/2019  . Pulmonary edema 10/31/2019  . Fluid overload 10/31/2019  . Acute heart failure ([place]) 10/31/2019  . Generalized papular rash  10/31/2019  . Pneumonia 08/20/2019  . 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[place]) 02/07/2019  . Acute on chronic renal failure ([place]) 02/07/2019  . Anxiety and depression   . 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 CKD stage 3 secondary to diabetes ([place]) 08/11/2018  . 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 Gangrene of right foot ([place])   . Lower limb ischemia 06/25/2018  . Lymphangitis 06/25/2018  . Menorrhagia 06/25/2018  . Hx of BKA, left ([place]) 05/24/2018  . History of left below knee amputation ([place]) 02/01/2018  . Gangrene of left foot ([place])   . Dehiscence of amputation stump ([place]) 01/26/2018  . Surgical wound, non healing 09/02/2017  . Bipolar 1 disorder ([place]) 09/02/2017  . Nausea vomiting and diarrhea 08/11/2017  . Great toe pain, left 08/11/2017  . GERD (gastroesophageal reflux disease) 08/10/2017  . Anxiety 08/10/2017  . Edema   . Cellulitis   . AKI (acute kidney injury) ([place]) 02/04/2017  . HLD (hyperlipidemia) 02/02/2017  . Tobacco abuse 02/02/2017  . Essential hypertension 02/02/2017  .  Malnutrition of moderate degree 02/02/2017   PCP:  Sandi Mariscal, MD Pharmacy:   [facility] [place], [address] [place] [address] Phone: [phone] Fax: [phone]     Social Determinants of Health (SDOH) Interventions    Readmission Risk Interventions Readmission Risk Prevention Plan 02/07/2019  Transportation Screening Complete  PCP or Specialist Appt within 3-5 Days Complete  HRI or [place] Complete  Social Work Consult for [place] Planning/Counseling Complete  Palliative Care Screening Not Applicable  Medication Review Press photographer) Complete  Some recent data might be hidden

## 2020-03-24 NOTE — ED Triage Notes (Signed)
Pt from local motel via ems with reports of ams. Pt unresponsive per ems to all but painful stimuli. CBG 98 with ems. Pt Hemodialysis pt. Unsure of last tx.

## 2020-03-24 NOTE — Progress Notes (Signed)
The Clinical status was relayed to family in detail. I have discussed with patients actual Sister,  Kathy Phelps   Updated and notified of patients medical condition.  Patient remains unresponsive Explained to family course of therapy and the modalities     Patient with Progressive multiorgan failure with very low chance of meaningful recovery despite all aggressive and optimal medical therapy.  Patient is in the dying Process associated with suffering.  Family understands the situation.  They have consented and agreed to DNR/DNI   Family are satisfied with Plan of action and management. All questions answered  Additional CC time 32 mins   [name] Patricia Pesa, M.D.  [facility]  Medical Director [place] Director [facility] Cardio-Pulmonary Department

## 2020-03-24 NOTE — ED Provider Notes (Signed)
[facility] Emergency Department Provider Note ____________________________________________   None    (approximate)  I have reviewed the triage vital signs and the nursing notes.   HISTORY  Chief Complaint Altered Mental Status  Level 5 caveat: History of present illness limited due to altered mental status  HPI Kathy Phelps is a 39 y.o. female with PMH as noted below including ESRD on dialysis, diabetes, bilateral BKA's, and other PMH as noted below who presents with altered mental status.  Per EMS, the patient was at a local motel with 2 children.  They were not able to give much history.  It is unknown when the patient last had dialysis.  The patient herself is unable to give any history.  Past Medical History:  Diagnosis Date  . Anxiety   . Asthma   . Bipolar affective ([place])   . Complete miscarriage   . Constipation   . 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 Depression   . Diabetes mellitus    Type II  . Dyspnea     " only at night and I stop breathing in my sleep too for about the last three weeks"  . GERD (gastroesophageal reflux disease)   . Headache    "migraraines"  . Hyperlipidemia   . Hypertension   . Osteomyelitis ([place])    left foot  . Pregnancy complication    HELP Syndrome  . Renal disorder   . Schizophrenia ([place])   . Stroke [facility])    " mild stroke", memory loss- approx 2017    Patient Active Problem List   Diagnosis Date Noted  . Encephalopathy 03/22/2020  . Seizure ([place]) 11/29/2019  . ESRD on hemodialysis ([place]) 10/31/2019  . Pulmonary edema 10/31/2019  . Fluid overload 10/31/2019  . Acute heart failure ([place]) 10/31/2019  . Generalized papular rash 10/31/2019  . Pneumonia 08/20/2019  . 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[place]) 02/07/2019  . Acute on chronic renal failure ([place] ) 02/07/2019  . Anxiety and depression   . 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[place]) 08/11/2018  . CKD stage 3 secondary to diabetes ([place])  08/11/2018  . 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 Gangrene of right foot ([place])   . Lower limb ischemia 06/25/2018  . Lymphangitis 06/25/2018  . Menorrhagia 06/25/2018  . Hx of BKA, left ([place]) 05/24/2018  . History of left below knee amputation ([place]) 02/01/2018  . Gangrene of left foot ([place])   . Dehiscence of amputation stump ([place]) 01/26/2018  . Surgical wound, non healing 09/02/2017  . Bipolar 1 disorder ([place]) 09/02/2017  . Nausea vomiting and diarrhea 08/11/2017  . Great toe pain, left 08/11/2017  . GERD (gastroesophageal reflux disease) 08/10/2017  . Anxiety 08/10/2017  . Edema   . Cellulitis   . AKI (acute kidney injury) ([place]) 02/04/2017  . HLD (hyperlipidemia) 02/02/2017  . Tobacco abuse 02/02/2017  . Essential hypertension 02/02/2017  . Malnutrition of moderate degree 02/02/2017    Past Surgical History:  Procedure Laterality Date  . ABDOMINAL AORTOGRAM N/A 09/07/2017   Procedure: ABDOMINAL AORTOGRAM;  Surgeon: Waynetta Sandy, MD;  Location: [place] [facility];  Service: Cardiovascular;  Laterality: N/A;  . ABDOMINAL AORTOGRAM W/LOWER EXTREMITY N/A 02/03/2017   Procedure: Abdominal Aortogram w/Lower Extremity;  Surgeon: Waynetta Sandy, MD;  Location: [place] [facility];  Service: Cardiovascular;  Laterality: N/A;  . ABDOMINAL AORTOGRAM W/LOWER EXTREMITY N/A 06/27/2018  Procedure: ABDOMINAL AORTOGRAM W/LOWER EXTREMITY;  Surgeon: Serafina [place], MD;  Location: [facility];  Service: Cardiovascular;  Laterality: N/A;  . AMPUTATION Left 02/10/2017   Procedure: AMPUTATION TOES 3, 4 AND 5  LEFT FOOT;  Surgeon: Waynetta Sandy, MD;  Location: [facility];  Service: Vascular;  Laterality: Left;  . AMPUTATION Left 12/21/2017   Procedure: LEFT FOOT 5TH RAY AMPUTATION;  Surgeon: Newt Minion, MD;  Location: [place];  Service: Orthopedics;  Laterality: Left;  . AMPUTATION Left 02/01/2018   Procedure:  LEFT BELOW KNEE AMPUTATION;  Surgeon: Newt Minion, MD;  Location: [place];  Service: Orthopedics;  Laterality: Left;  . AMPUTATION Right 08/11/2018   Procedure: AMPUTATION BELOW KNEE;  Surgeon: Newt Minion, MD;  Location: [place];  Service: Orthopedics;  Laterality: Right;  . CESAREAN SECTION    . CHOLECYSTECTOMY    . LOWER EXTREMITY ANGIOGRAPHY Bilateral 09/07/2017   Procedure: Lower Extremity Angiography;  Surgeon: Waynetta Sandy, MD;  Location: [facility] [facility];  Service: Cardiovascular;  Laterality: Bilateral;  . PERIPHERAL VASCULAR ATHERECTOMY  02/03/2017   Procedure: Peripheral Vascular Atherectomy;  Surgeon: Waynetta Sandy, MD;  Location: [place] [facility];  Service: Cardiovascular;;  . PERIPHERAL VASCULAR ATHERECTOMY Right 06/27/2018   Procedure: PERIPHERAL VASCULAR ATHERECTOMY;  Surgeon: Serafina [place], MD;  Location: [facility];  Service: Cardiovascular;  Laterality: Right;  superficial femoral  . PERIPHERAL VASCULAR BALLOON ANGIOPLASTY  02/03/2017   Procedure: Peripheral Vascular Balloon Angioplasty;  Surgeon: Waynetta Sandy, MD;  Location: [place] [facility];  Service: Cardiovascular;;  . PERIPHERAL VASCULAR INTERVENTION Left 09/07/2017   Procedure: PERIPHERAL VASCULAR INTERVENTION;  Surgeon: Waynetta Sandy, MD;  Location: [place] [facility];  Service: Cardiovascular;  Laterality: Left;  SFA/POPLITEAL  . STUMP REVISION Left 05/24/2018  . STUMP REVISION Left 05/24/2018   Procedure: REVISION LEFT BELOW KNEE AMPUTATION;  Surgeon: Newt Minion, MD;  Location: [place];  Service: Orthopedics;  Laterality: Left;  . TUBAL LIGATION      Prior to Admission medications   Medication Sig Start Date End Date Taking? Authorizing Provider  ALPRAZolam Duanne Moron) 1 MG tablet Take 1 mg by mouth 3 (three) times daily as needed for anxiety.  06/07/19   [provider]  amLODipine (NORVASC) 5 MG tablet Take 5 mg by mouth every evening.  12/04/19    [provider]  baclofen (LIORESAL) 10 MG tablet Take 10 mg by mouth 3 (three) times daily as needed. 02/28/20   [provider]  cloNIDine (CATAPRES - DOSED IN MG/24 HR) 0.1 mg/24hr patch Place 0.1 mg onto the skin once a week.  01/03/20   [provider]  gabapentin (NEURONTIN) 800 MG tablet Take 1 tablet (800 mg total) by mouth 2 (two) times daily. 02/11/17   Hosie Poisson, MD  hydrOXYzine (ATARAX/VISTARIL) 50 MG tablet Take 50 mg by mouth every 6 (six) hours as needed for anxiety.  12/27/19   [provider]  LATUDA 60 MG TABS Take 60 mg by mouth at bedtime.  07/05/19   [provider]  losartan (COZAAR) 50 MG tablet Take 50 mg by mouth every evening.  01/22/20   [provider]  oxyCODONE-acetaminophen (PERCOCET) 10-325 MG tablet Take 1 tablet by mouth 4 (four) times daily as needed. 02/28/20   [provider]  oxyCODONE-acetaminophen (PERCOCET) 7.5-325 MG tablet Take 1 tablet by mouth 4 (four) times daily as needed for pain. 11/23/19   [provider]  Vitamin D, Ergocalciferol, (DRISDOL) 1.25 MG (50000 UT) CAPS capsule Take 50,000 Units by mouth once a week. 07/13/19   [provider]    Allergies Hydrocodone-acetaminophen and Tylenol [acetaminophen]  Family History  Problem Relation Age of Onset  . Diabetes Mellitus II Mother   . Heart disease Mother   . Heart disease Father     Social History Social History   Tobacco Use  . Smoking status: Current Every Day Smoker    Packs/day: 1.00    Years: 28.00    Pack years: 28.00    Types: Cigarettes  . Smokeless tobacco: Never Used  Vaping Use  . Vaping Use: Former  Substance Use Topics  . Alcohol use: No  . Drug use: No    Review of Systems Level 5 caveat: Unable to 10 review of systems due to altered mental status   ____________________________________________   PHYSICAL EXAM:  VITAL SIGNS: ED Triage Vitals  Enc Vitals Group     BP --       Pulse Rate 03/16/2020 1028 (!) 102     Resp 04/02/2020 1028 15     Temp --      Temp src --      SpO2 04/03/2020 1028 98 %     Weight 04/07/2020 1030 205 lb 0.4 oz (93 kg)     Height 03/27/2020 1030 4\' 1"  (1.245 m)     Head Circumference --      Peak Flow --      Pain Score --      Pain Loc --      Pain Edu? --      Excl. in [place]? --     Constitutional: Awake, nonverbal.  Intermittently moaning.  Responds to painful stimuli. Eyes: Conjunctivae are slightly icteric.  EOMI.  PERRLA. Head: Atraumatic. Nose: No congestion/rhinnorhea. Mouth/Throat: Mucous membranes are dry. Neck: Normal range of motion.  Cardiovascular: Borderline tachycardic, regular rhythm. Grossly normal heart sounds.  Good peripheral circulation. Respiratory: Normal respiratory effort.  No retractions. Lungs CTAB. Gastrointestinal: Soft and nontender. No distention.  Genitourinary: No flank tenderness. Musculoskeletal: 2+ bilateral upper and lower extremity edema.  Extremities warm and well perfused.  Neurologic: Motor intact in all extremities. Skin:  Skin is warm and dry. No rash noted.  Jaundiced appearing. Psychiatric: Unable to assess due to altered mental status.  ____________________________________________   LABS (all labs ordered are listed, but only abnormal results are displayed)  Labs Reviewed  ACETAMINOPHEN LEVEL - Abnormal; Notable for the following components:      Result Value   Acetaminophen (Tylenol), Serum <10 (*)    All other components within normal limits  BASIC METABOLIC PANEL - Abnormal; Notable for the following components:   Sodium 124 (*)    Potassium 5.5 (*)    Chloride 89 (*)    CO2 17 (*)    BUN 74 (*)    Creatinine, Ser 7.23 (*)    Calcium 7.8 (*)    GFR calc non Af Amer 6 (*)    GFR calc Af Amer 7 (*)    Anion gap 18 (*)    All other components within normal limits  HEPATIC FUNCTION PANEL - Abnormal; Notable for the following components:   Total Protein 6.0 (*)    Albumin 2.2  (*)    AST 44 (*)    Alkaline Phosphatase 252 (*)    Total Bilirubin 12.2 (*)    Bilirubin, Direct 8.4 (*)  Indirect Bilirubin 3.8 (*)    All other components within normal limits  BRAIN NATRIURETIC PEPTIDE - Abnormal; Notable for the following components:   B Natriuretic Peptide >4,500.0 (*)    All other components within normal limits  SALICYLATE LEVEL - Abnormal; Notable for the following components:   Salicylate Lvl <4.8 (*)    All other components within normal limits  LACTIC ACID, PLASMA - Abnormal; Notable for the following components:   Lactic Acid, Venous 2.6 (*)    All other components within normal limits  CBC WITH DIFFERENTIAL/PLATELET - Abnormal; Notable for the following components:   WBC 15.4 (*)    RBC 3.64 (*)    Hemoglobin 11.0 (*)    HCT 30.9 (*)    Platelets 22 (*)    nRBC 0.3 (*)    Neutro Abs 12.4 (*)    Lymphs Abs 0.3 (*)    Abs Immature Granulocytes 2.09 (*)    All other components within normal limits  PROTIME-INR - Abnormal; Notable for the following components:   Prothrombin Time 16.9 (*)    INR 1.4 (*)    All other components within normal limits  AMMONIA - Abnormal; Notable for the following components:   Ammonia 42 (*)    All other components within normal limits  TROPONIN I (HIGH SENSITIVITY) - Abnormal; Notable for the following components:   Troponin I (High Sensitivity) 1,706 (*)    All other components within normal limits  SARS CORONAVIRUS 2 BY RT PCR (HOSPITAL ORDER, [facility])  ETHANOL  LIPASE, BLOOD  LACTIC ACID, PLASMA  URINALYSIS, COMPLETE (UACMP) WITH MICROSCOPIC  URINE DRUG SCREEN, QUALITATIVE ([facility] ONLY)  PATHOLOGIST SMEAR REVIEW  FIBRINOGEN  FIBRIN DERIVATIVES D-DIMER ([facility] ONLY)  TROPONIN I (HIGH SENSITIVITY)   ____________________________________________  EKG  ED ECG REPORT I, Arta Silence, the attending physician, personally viewed and interpreted this ECG.  Date: 03/23/2020 EKG  Time: 1028 Rate: 101 Rhythm: sinus tachycardia QRS Axis: normal Intervals: normal ST/T Wave abnormalities: normal Narrative Interpretation: no evidence of acute ischemia  ____________________________________________  RADIOLOGY  CT head: No ICH or other acute findings CXR: Vascular congestion, pleural effusions  ____________________________________________   PROCEDURES  Procedure(s) performed: No  Procedures  Critical Care performed: Yes  CRITICAL CARE Performed by: Arta Silence   Total critical care time: 30 minutes  Critical care time was exclusive of separately billable procedures and treating other patients.  Critical care was necessary to treat or prevent imminent or life-threatening deterioration.  Critical care was time spent personally by me on the following activities: development of treatment plan with patient and/or surrogate as well as nursing, discussions with consultants, evaluation of patient's response to treatment, examination of patient, obtaining history from patient or surrogate, ordering and performing treatments and interventions, ordering and review of laboratory studies, ordering and review of radiographic studies, pulse oximetry and re-evaluation of patient's condition. ____________________________________________   INITIAL IMPRESSION / ASSESSMENT AND PLAN / ED COURSE  Pertinent labs & imaging results that were available during my care of the patient were reviewed by me and considered in my medical decision making (see chart for details).  39 year old female with extensive PMH as noted above including ESRD on dialysis and diabetes presents with altered mental status, unclear onset.  EMS was not able to provide much history, and the patient cannot give any history.  I reviewed the past medical records in [place].  The patient was most recently seen in the ED 3 days ago  with chest pain.  Work-up was significant for ascites and borderline  elevated troponin, the patient was also hyponatremic and got IV fluids.  It appears that she was alert and oriented at that time.  She was previously seen in April after having missed dialysis.  On exam currently, the patient is awake but appears very altered and is not verbal.  She is moaning, and does respond to painful stimuli.  Neurologic exam is nonfocal.  She appears jaundiced.  Her vital signs are normal except for borderline tachycardia.  She has a good respiratory effort and O2 saturation in the high 90s on room air.  The exam is otherwise as described above.  I do not see documented history of cirrhosis although she was noted to have ascites on the CT a few days ago.  Differential includes hepatic encephalopathy, dehydration, electrolyte abnormality, uremia, or other metabolic etiology, or less likely primary CNS or cardiac cause.  We will obtain CT head, chest x-ray, lab work-up, and reassess.  I anticipate that the patient will need admission.  ----------------------------------------- 12:13 PM on 03/22/2020 -----------------------------------------  Lab work-up reveals multiple acute issues.  The patient has new significant thrombocytopenia, elevated bilirubin, and a significantly elevated troponin and BNP.  There is also leukocytosis and a slightly elevated lactate.  Potassium is only minimally elevated.  CT head is negative.  Chest x-ray shows vascular congestion and effusions.  I consulted Dr. Candiss Norse from nephrology to evaluate the patient for urgent dialysis.  I ordered broad-spectrum antibiotics to cover for possible infection/sepsis.  Given elevated lactate acid I also gave a small fluid bolus.  There is no indication for platelet transfusion at this time as the patient has no active bleeding.  I suspect that the elevated troponin is demand ischemia.  She has no EKG changes.  The exact etiology of the patient's altered mental status is unclear given that her ammonia is only  somewhat elevated, however could be a combination of hepatic encephalopathy and uremia.  Although the vital signs are stable, given the patient's multiple acute issues, (likely new onset liver failure, significant thrombocytopenia, NSTEMI, and need for dialysis), I consulted Dr. Mortimer Fries from ICU to evaluate the patient for admission.  He agrees to admit the patient to the ICU.   ____________________________________________   FINAL CLINICAL IMPRESSION(S) / ED DIAGNOSES  Final diagnoses:  Altered mental status, unspecified altered mental status type  Hypervolemia, unspecified hypervolemia type  Acute liver failure with hepatic coma (HCC)  NSTEMI (non-ST elevated myocardial infarction) ([place])      NEW MEDICATIONS STARTED DURING THIS VISIT:  New Prescriptions   No medications on file     Note:  This document was prepared using Dragon voice recognition software and may include unintentional dictation errors.   Arta Silence, MD 03/28/2020 1217

## 2020-03-24 NOTE — Progress Notes (Signed)
*  PRELIMINARY RESULTS* Echocardiogram 2D Echocardiogram has been performed.  Kathy Phelps 03/19/2020, 3:41 PM

## 2020-03-24 NOTE — TOC Progression Note (Signed)
Transition of Care [facility]) - Progression Note    Patient Details  Name: AMARIANNA ABPLANALP MRN: [id number] Date of Birth: 24-Jun-1981  [facility] CM/SW [place], [place] Phone Number: [phone] 03/26/2020, 2:18 PM  Clinical Narrative:     This CSW spoke with the patient's SO Wynonia Hazard, who only speaks [place], on behalf of Misty RN/CM.  This CSW inquired about the patient's next of kin, and Mr. Edsel Petrin informed me the patient's mother is deceased, the patient did not have a relationship with her father and he may also be deceased.  Mr. Edsel Petrin stated the patient does not have a relationship with her sister.  This CSW informed the patient is very ill and he and his daughters may  Need to come and visit the patient.  This CSW informed Mr. Edsel Petrin the [place] interpreter would contact him to interpret to speak with the attending. Mr. Edsel Petrin verbalized understanding and stated he would keep his phone with him.       Expected Discharge Plan and Services                                                 Social Determinants of Health (SDOH) Interventions    Readmission Risk Interventions Readmission Risk Prevention Plan 02/07/2019  Transportation Screening Complete  PCP or Specialist Appt within 3-5 Days Complete  HRI or [place] Complete  Social Work Consult for [place] Planning/Counseling Complete  Palliative Care Screening Not Applicable  Medication Review Press photographer) Complete  Some recent data might be hidden

## 2020-03-24 NOTE — ED Notes (Signed)
Rectal temp obtained showing 99.0, no obvious redness or skin breaks noted to patient buttuck or sacral area. Patient straight cath for urine 600 cloudy amber foul smelling urine emptied from patients bladder. Urine and urine culture sent to lab. Peri care provided and purwhick placed. Will monitor.

## 2020-03-24 NOTE — Progress Notes (Signed)
[facility], [place] 04/05/2020  Subjective:   LOS: 0  Patient known to our practice from outpatient dialysis.  She presents from [place] [employer] via EMS for altered mental status.  Patient not able to answer any questions.  Eyes are open.  She responds only to painful stimuli.  Not able to provide any meaningful information. Case discussed with outpatient dialysis center.  Nursing reports that she was alert and oriented on Friday.  She had chest pain radiating to her arm on Friday therefore was sent to the emergency room for evaluation.  She underwent a CT without contrast which showed volume overload, bilateral effusions, anasarca.  Chest x-ray showed vascular congestion and mild pulmonary edema, stable cardiomegaly, bilateral pleural effusions. Last HD was March 21, 2020.  Target weight 71 kg.  Preweight 80 kg.   Objective:  Vital signs in last 24 hours:  Temp:  [99 F (37.2 C)] 99 F (37.2 C) (06/14 1139) Pulse Rate:  [98-102] 98 (06/14 1230) Resp:  [14-22] 14 (06/14 1230) BP: (119-126)/(73-87) 119/73 (06/14 1230) SpO2:  [98 %-100 %] 100 % (06/14 1230) Weight:  [93 kg] 93 kg (06/14 1030)  Weight change:  Filed Weights   04/04/2020 1030  Weight: 93 kg    Intake/Output:    Intake/Output Summary (Last 24 hours) at 03/30/2020 1242 Last data filed at 04/06/2020 1235 Gross per 24 hour  Intake 100 ml  Output --  Net 100 ml     Physical Exam: General:  Critically ill-appearing  HEENT  pupils are round, 3 to 4 mm, sluggish reaction to light, dry oral mucous membranes  Pulm/lungs  shallow breathing effort, room air  CVS/Heart  irregular rhythm  Abdomen:   Soft, mildly distended  Extremities:  3-4+ pitting edema up to lower abdomen  Neurologic:  Responds only to painful stimuli  Skin:  Jaundice  Access:  Right IJ PermCath       Basic Metabolic Panel:  Recent Labs  Lab 03/21/20 1504 03/16/2020 1033  NA 123* 124*  K 4.9 5.5*  CL 89* 89*  CO2 20*  17*  GLUCOSE 101* 72  BUN 47* 74*  CREATININE 5.83* 7.23*  CALCIUM 7.8* 7.8*     CBC: Recent Labs  Lab 03/21/20 1504 04/02/2020 1033  WBC 6.7 15.4*  NEUTROABS 6.2 12.4*  HGB 10.8* 11.0*  HCT 32.4* 30.9*  MCV 91.3 84.9  PLT 96* 22*      Lab Results  Component Value Date   HEPBSAG NON REACTIVE 11/01/2019   HEPBSAB NON REACTIVE 11/01/2019      Microbiology:  No results found for this or any previous visit (from the past 240 hour(s)).  Coagulation Studies: Recent Labs    03/13/2020 1033  LABPROT 16.9*  INR 1.4*    Urinalysis: Recent Labs    03/14/2020 [place] 1.010  PHURINE 7.0  GLUCOSEU NEGATIVE  HGBUR NEGATIVE  BILIRUBINUR NEGATIVE  KETONESUR NEGATIVE  PROTEINUR >=300*  NITRITE NEGATIVE  LEUKOCYTESUR TRACE*      Imaging: CT Head Wo Contrast  Result Date: 04/02/2020 CLINICAL DATA:  Altered mental status EXAM: CT HEAD WITHOUT CONTRAST TECHNIQUE: Contiguous axial images were obtained from the base of the skull through the vertex without intravenous contrast. COMPARISON:  November 29, 2019 FINDINGS: Brain: The ventricles and sulci are normal in size and configuration. There is no intracranial mass, hemorrhage, extra-axial fluid collection, or midline shift. There are prior infarcts in each posterosuperior parietal lobe as well as  in the medial occipital lobes bilaterally and in the mid right occipital lobe. No acute appearing infarct is demonstrable on this study. Vascular: No hyperdense vessels. There are is slight calcification in each carotid siphon region. Skull: The bony calvarium appears intact. Sinuses/Orbits: Visualized paranasal sinuses are clear. Orbits appear symmetric bilaterally. Other: Mastoid air cells are clear. IMPRESSION: Scattered prior infarcts in the occipital and parietal lobes bilaterally. No acute appearing infarct is evident. No appreciable mass or hemorrhage. Slight calcification noted in each carotid siphon.  Electronically Signed   By: Lowella Grip III M.D.   On: 04/01/2020 11:04   DG Chest Portable 1 View  Result Date: 03/29/2020 CLINICAL DATA:  Altered mental status with weakness EXAM: PORTABLE CHEST 1 VIEW COMPARISON:  Three days ago FINDINGS: Cardiomegaly. Dialysis catheter on the right with tip at the right atrium. Small pleural effusions and vascular congestion. IMPRESSION: Small pleural effusions and vascular congestion. Electronically Signed   By: Monte Fantasia M.D.   On: 03/29/2020 11:10     Medications:   . sodium chloride    . famotidine (PEPCID) IV    . metronidazole 500 mg (04/05/2020 1152)  . vancomycin    . vancomycin     . heparin  5,000 Units Subcutaneous Q8H  . sodium chloride flush  3 mL Intravenous Q12H   sodium chloride, acetaminophen, docusate sodium, ondansetron (ZOFRAN) IV, polyethylene glycol, sodium chloride flush  Assessment/ Plan:  39 y.o. female with multiple medical problems including anxiety, diabetes, hypertension, end-stage renal disease, bilateral BKA, history of stroke with left-sided weakness was admitted on 03/23/2020 for  Active Problems:   Encephalopathy  Encephalopathy [G93.40]  [facility]/MWF/[address] dialysis/71 kg  #.ESRD #Volume overload #Hyperkalemia #Altered mental status Patient presents with altered mental status and volume overload. She received partial dialysis treatment on June 11 She has significant volume overload today. Differential of altered mental status includes stroke (CT head negative for intracranial bleed), sepsis, uremia Patient is critically ill and goals of care are being clarified.  Palliative care team is evaluating the case along with ICU team. Discussed with ICU team to alert [place] when plan of care is in place so dialysis can be arranged  #. Anemia of CKD  Lab Results  Component Value Date   HGB 11.0 (L) 03/12/2020   Low dose EPO with HD  #. Secondary hyperparathyroidism of renal origin N 25.81       Component Value Date/Time   PTH NOT PERFORMED 06/16/2019 0611   Lab Results  Component Value Date   PHOS 2.3 (L) 12/01/2019   PHOS 2.3 (L) 12/01/2019   Monitor calcium and phos level during this admission   #.  Altered mental status See above  #Shock liver Probably related to cardiac dysfunction 2 D echo results are pending   LOS: 0 Riely Oetken [facility] 6/14/202112:42 PM  [facility] [place], [place]

## 2020-03-24 NOTE — Progress Notes (Signed)
PHARMACY -  BRIEF ANTIBIOTIC NOTE   Pharmacy has received consult(s) for cefepime and vancomycin from an ED provider.  The patient's profile has been reviewed for ht/wt/allergies/indication/available labs.    One time order(s) placed for cefepime 2 g IV x1 and vancomycin 1g IV x1 by ED MD. Will order another vancomycin 1 g IV x1 for total loading dose of vancomycin 2 g.  Further antibiotics/pharmacy consults should be ordered by admitting physician if indicated.                       Thank you, Rocky Morel 04/08/2020  12:07 PM

## 2020-03-25 ENCOUNTER — Inpatient Hospital Stay: Payer: Medicare Other

## 2020-03-25 DIAGNOSIS — R4182 Altered mental status, unspecified: Secondary | ICD-10-CM

## 2020-03-25 DIAGNOSIS — I5189 Other ill-defined heart diseases: Secondary | ICD-10-CM

## 2020-03-25 DIAGNOSIS — R778 Other specified abnormalities of plasma proteins: Secondary | ICD-10-CM

## 2020-03-25 DIAGNOSIS — D696 Thrombocytopenia, unspecified: Secondary | ICD-10-CM

## 2020-03-25 DIAGNOSIS — R748 Abnormal levels of other serum enzymes: Secondary | ICD-10-CM

## 2020-03-25 DIAGNOSIS — I429 Cardiomyopathy, unspecified: Secondary | ICD-10-CM

## 2020-03-25 DIAGNOSIS — K7201 Acute and subacute hepatic failure with coma: Secondary | ICD-10-CM

## 2020-03-25 DIAGNOSIS — Z7189 Other specified counseling: Secondary | ICD-10-CM

## 2020-03-25 DIAGNOSIS — D689 Coagulation defect, unspecified: Secondary | ICD-10-CM

## 2020-03-25 DIAGNOSIS — Z515 Encounter for palliative care: Secondary | ICD-10-CM

## 2020-03-25 LAB — PROCALCITONIN: Procalcitonin: 5.37 ng/mL

## 2020-03-25 LAB — COMPREHENSIVE METABOLIC PANEL
ALT: 11 U/L (ref 0–44)
AST: 74 U/L — ABNORMAL HIGH (ref 15–41)
Albumin: 1.9 g/dL — ABNORMAL LOW (ref 3.5–5.0)
Alkaline Phosphatase: 170 U/L — ABNORMAL HIGH (ref 38–126)
Anion gap: 28 — ABNORMAL HIGH (ref 5–15)
BUN: 83 mg/dL — ABNORMAL HIGH (ref 6–20)
CO2: 11 mmol/L — ABNORMAL LOW (ref 22–32)
Calcium: 7.5 mg/dL — ABNORMAL LOW (ref 8.9–10.3)
Chloride: 90 mmol/L — ABNORMAL LOW (ref 98–111)
Creatinine, Ser: 7.38 mg/dL — ABNORMAL HIGH (ref 0.44–1.00)
GFR calc Af Amer: 7 mL/min — ABNORMAL LOW (ref >60)
GFR calc non Af Amer: 6 mL/min — ABNORMAL LOW (ref >60)
Glucose, Bld: 74 mg/dL (ref 70–99)
Potassium: 6.6 mmol/L (ref 3.5–5.1)
Sodium: 129 mmol/L — ABNORMAL LOW (ref 135–145)
Total Bilirubin: 13.3 mg/dL — ABNORMAL HIGH (ref 0.3–1.2)
Total Protein: 5.1 g/dL — ABNORMAL LOW (ref 6.5–8.1)

## 2020-03-25 LAB — BASIC METABOLIC PANEL
Anion gap: 27 — ABNORMAL HIGH (ref 5–15)
BUN: 82 mg/dL — ABNORMAL HIGH (ref 6–20)
CO2: 9 mmol/L — ABNORMAL LOW (ref 22–32)
Calcium: 7.7 mg/dL — ABNORMAL LOW (ref 8.9–10.3)
Chloride: 90 mmol/L — ABNORMAL LOW (ref 98–111)
Creatinine, Ser: 7.33 mg/dL — ABNORMAL HIGH (ref 0.44–1.00)
GFR calc Af Amer: 7 mL/min — ABNORMAL LOW (ref >60)
GFR calc non Af Amer: 6 mL/min — ABNORMAL LOW (ref >60)
Glucose, Bld: 100 mg/dL — ABNORMAL HIGH (ref 70–99)
Potassium: 6.5 mmol/L (ref 3.5–5.1)
Sodium: 126 mmol/L — ABNORMAL LOW (ref 135–145)

## 2020-03-25 LAB — CBC
HCT: 29.7 % — ABNORMAL LOW (ref 36.0–46.0)
Hemoglobin: 10.2 g/dL — ABNORMAL LOW (ref 12.0–15.0)
MCH: 30.6 pg (ref 26.0–34.0)
MCHC: 34.3 g/dL (ref 30.0–36.0)
MCV: 89.2 fL (ref 80.0–100.0)
Platelets: 14 10*3/uL — CL (ref 150–400)
RBC: 3.33 MIL/uL — ABNORMAL LOW (ref 3.87–5.11)
RDW: 16.2 % — ABNORMAL HIGH (ref 11.5–15.5)
WBC: 19.6 10*3/uL — ABNORMAL HIGH (ref 4.0–10.5)
nRBC: 0.3 % — ABNORMAL HIGH (ref 0.0–0.2)

## 2020-03-25 LAB — HEPATITIS PANEL, ACUTE
HCV Ab: NONREACTIVE
Hep A IgM: NONREACTIVE
Hep B C IgM: NONREACTIVE
Hepatitis B Surface Ag: NONREACTIVE

## 2020-03-25 LAB — BLOOD GAS, VENOUS
Acid-base deficit: 17 mmol/L — ABNORMAL HIGH (ref 0.0–2.0)
Bicarbonate: 8.1 mmol/L — ABNORMAL LOW (ref 20.0–28.0)
FIO2: 0.21
O2 Saturation: 68.5 %
Patient temperature: 37
pCO2, Ven: <19 mmHg — CL (ref 44.0–60.0)
pH, Ven: 7.26 (ref 7.250–7.430)
pO2, Ven: 42 mmHg (ref 32.0–45.0)

## 2020-03-25 LAB — GLUCOSE, CAPILLARY
Glucose-Capillary: 102 mg/dL — ABNORMAL HIGH (ref 70–99)
Glucose-Capillary: 83 mg/dL (ref 70–99)
Glucose-Capillary: 72 mg/dL (ref 70–99)

## 2020-03-25 LAB — PROTIME-INR
INR: 2.2 — ABNORMAL HIGH (ref 0.8–1.2)
Prothrombin Time: 23.7 seconds — ABNORMAL HIGH (ref 11.4–15.2)

## 2020-03-25 LAB — PHOSPHORUS: Phosphorus: 7.4 mg/dL — ABNORMAL HIGH (ref 2.5–4.6)

## 2020-03-25 LAB — MAGNESIUM: Magnesium: 2.3 mg/dL (ref 1.7–2.4)

## 2020-03-25 MED ORDER — MORPHINE SULFATE (PF) 2 MG/ML IV SOLN
1.0000 mg | Freq: Once | INTRAVENOUS | Status: AC
  Administered 2020-03-25: 1 mg via INTRAVENOUS
  Filled 2020-03-25: qty 1

## 2020-03-25 MED ORDER — MORPHINE SULFATE (PF) 2 MG/ML IV SOLN
2.0000 mg | INTRAVENOUS | Status: DC | PRN
  Administered 2020-03-25 (×2): 2 mg via INTRAVENOUS
  Filled 2020-03-25 (×3): qty 1

## 2020-03-25 MED ORDER — ALBUMIN HUMAN 25 % IV SOLN: 25.0000 g | Freq: Once | INTRAVENOUS | Status: DC

## 2020-03-25 MED ORDER — MORPHINE SULFATE (PF) 2 MG/ML IV SOLN
INTRAVENOUS | Status: AC
  Administered 2020-03-25: 2 mg via INTRAMUSCULAR
  Filled 2020-03-25: qty 1

## 2020-03-25 MED ORDER — SODIUM BICARBONATE 8.4 % IV SOLN
200.0000 meq | Freq: Once | INTRAVENOUS | Status: AC
  Administered 2020-03-25: 200 meq via INTRAVENOUS

## 2020-03-25 MED ORDER — EPOETIN ALFA 10000 UNIT/ML IJ SOLN: 4000.0000 [IU] | Freq: Once | INTRAMUSCULAR | Status: DC

## 2020-03-25 MED ORDER — LEVETIRACETAM IN NACL 1000 MG/100ML IV SOLN
1000.0000 mg | Freq: Once | INTRAVENOUS | Status: AC
  Administered 2020-03-25: 1000 mg via INTRAVENOUS
  Filled 2020-03-25: qty 100

## 2020-03-25 MED ORDER — PANTOPRAZOLE SODIUM 40 MG IV SOLR: 40.0000 mg | INTRAVENOUS | Status: DC

## 2020-04-10 NOTE — Progress Notes (Signed)
Progress Note  Patient Name: Kathy Phelps Date of Encounter: 2020-03-28  [facility] Cardiologist: Agbor-Etang  Subjective   Patient unresponsive.  She does not respond to verbal or tactile stimuli.  Inpatient Medications    Scheduled Meds: . Chlorhexidine Gluconate Cloth  6 each Topical Q0600  . epoetin (EPOGEN/PROCRIT) injection  4,000 Units Intravenous Once  . pantoprazole (PROTONIX) IV  40 mg Intravenous Q24H  . sodium chloride flush  3 mL Intravenous Q12H   Continuous Infusions: . sodium chloride    . albumin human    . cefTRIAXone (ROCEPHIN)  IV Stopped (03/15/2020 1726)   PRN Meds: sodium chloride, acetaminophen, docusate sodium, ondansetron (ZOFRAN) IV, polyethylene glycol, sodium chloride flush   Vital Signs    Vitals:   03-28-2020 1000 28-Mar-2020 1100 2020/03/28 1200 March 28, 2020 1205  BP: 99/61 104/63  100/65  Pulse: 81 82 81 80  Resp: '17 19 19 16  ' Temp:      TempSrc:      SpO2: 96% 96% 99% 99%  Weight:      Height:        Intake/Output Summary (Last 24 hours) at 28-Mar-2020 1328 Last data filed at 28-Mar-2020 1016 Gross per 24 hour  Intake 654.83 ml  Output 10 ml  Net 644.83 ml   Last 3 Weights Mar 28, 2020 03/31/2020 03/19/2020  Weight (lbs) 175 lb 0.7 oz 175 lb 7.8 oz 205 lb 0.4 oz  Weight (kg) 79.4 kg 79.6 kg 93 kg      Telemetry    NSR and sinus tachycardia - Personally Reviewed  ECG    No new tracing  Physical Exam   GEN: Unresponsive, gazing to the left.  Jaundice noted. Neck: JVP ~8 cm. Cardiac: RRR with 2/6 systolic murmur. Respiratory: Clear anteriorly GI: Soft, nontender, non-distended  MS: Patient s/p bilateral BKA's.  Trace edema noted at BKA stumps. Neuro:  Disordered breathing.  Unresponsive. Psych: Unable to assess due to AMS.  Labs    High Sensitivity Troponin:   Recent Labs  Lab 03/21/20 1504 03/21/20 1856 03/27/2020 1033 03/23/2020 1311 04/08/2020 1956  TROPONINIHS 38* 52* 1,706* 1,842* 2,270*      Chemistry Recent Labs   Lab 03/21/20 1504 03/21/20 1504 03/23/2020 1033 04/07/2020 1033 04/04/2020 1956 2020-03-28 0416 2020/03/28 0932  NA 123*   < > 124*  --   --  126* 129*  K 4.9   < > 5.5*   < > 6.2* 6.5* 6.6*  CL 89*   < > 89*  --   --  90* 90*  CO2 20*   < > 17*  --   --  9* 11*  GLUCOSE 101*   < > 72  --   --  100* 74  BUN 47*   < > 74*  --   --  82* 83*  CREATININE 5.83*   < > 7.23*  --   --  7.33* 7.38*  CALCIUM 7.8*   < > 7.8*  --   --  7.7* 7.5*  PROT 6.0*  --  6.0*  --   --   --  5.1*  ALBUMIN 2.4*  --  2.2*  --   --   --  1.9*  AST 20  --  44*  --   --   --  74*  ALT 9  --  10  --   --   --  11  ALKPHOS 211*  --  252*  --   --   --  170*  BILITOT 4.0*  --  12.2*  --   --   --  13.3*  GFRNONAA 8*   < > 6*  --   --  6* 6*  GFRAA 10*   < > 7*  --   --  7* 7*  ANIONGAP 14   < > 18*  --   --  27* 28*   < > = values in this interval not displayed.     Hematology Recent Labs  Lab 03/21/20 1504 04/07/2020 1033 10-Apr-2020 0416  WBC 6.7 15.4* 19.6*  RBC 3.55* 3.64* 3.33*  HGB 10.8* 11.0* 10.2*  HCT 32.4* 30.9* 29.7*  MCV 91.3 84.9 89.2  MCH 30.4 30.2 30.6  MCHC 33.3 35.6 34.3  RDW 14.5 15.3 16.2*  PLT 96* 22* 14*    BNP Recent Labs  Lab 03/30/2020 1033  BNP >4,500.0*     DDimer No results for input(s): DDIMER in the last 168 hours.   Radiology    CT Head Wo Contrast  Result Date: 04/06/2020 CLINICAL DATA:  Altered mental status EXAM: CT HEAD WITHOUT CONTRAST TECHNIQUE: Contiguous axial images were obtained from the base of the skull through the vertex without intravenous contrast. COMPARISON:  November 29, 2019 FINDINGS: Brain: The ventricles and sulci are normal in size and configuration. There is no intracranial mass, hemorrhage, extra-axial fluid collection, or midline shift. There are prior infarcts in each posterosuperior parietal lobe as well as in the medial occipital lobes bilaterally and in the mid right occipital lobe. No acute appearing infarct is demonstrable on this study.  Vascular: No hyperdense vessels. There are is slight calcification in each carotid siphon region. Skull: The bony calvarium appears intact. Sinuses/Orbits: Visualized paranasal sinuses are clear. Orbits appear symmetric bilaterally. Other: Mastoid air cells are clear. IMPRESSION: Scattered prior infarcts in the occipital and parietal lobes bilaterally. No acute appearing infarct is evident. No appreciable mass or hemorrhage. Slight calcification noted in each carotid siphon. Electronically Signed   By: Lowella Grip III M.D.   On: 04/08/2020 11:04   DG Chest Port 1 View  Result Date: 10-Apr-2020 CLINICAL DATA:  Unresponsive EXAM: PORTABLE CHEST 1 VIEW COMPARISON:  04/08/2020 FINDINGS: Cardiomegaly. Right dialysis catheter remains in place, unchanged. Mild vascular congestion. Small bilateral effusions again noted, unchanged. Interstitial and airspace opacities in the lower lungs could reflect edema. IMPRESSION: Cardiomegaly. Suspect mild pulmonary edema. Small bilateral effusions. Electronically Signed   By: Rolm Baptise M.D.   On: 2020-04-10 01:15   DG Chest Portable 1 View  Result Date: 03/21/2020 CLINICAL DATA:  Altered mental status with weakness EXAM: PORTABLE CHEST 1 VIEW COMPARISON:  Three days ago FINDINGS: Cardiomegaly. Dialysis catheter on the right with tip at the right atrium. Small pleural effusions and vascular congestion. IMPRESSION: Small pleural effusions and vascular congestion. Electronically Signed   By: Monte Fantasia M.D.   On: 03/19/2020 11:10   ECHOCARDIOGRAM COMPLETE  Result Date: 03/15/2020    ECHOCARDIOGRAM REPORT   Patient Name:   Kathy Phelps Date of Exam: 03/14/2020 Medical Rec #:  [id number]       Height:       48.0 in Accession #:    [id number]      Weight:       175.5 lb Date of Birth:  29-Dec-1980        BSA:          1.502 m Patient Age:    27 years  BP:           120/77 mmHg Patient Gender: F               HR:           98 bpm. Exam Location:  [facility] Procedure:  2D Echo, Cardiac Doppler and Color Doppler Indications:     Elevated troponin  History:         Patient has prior history of Echocardiogram examinations, most                  recent 11/01/2019. Stroke; Risk Factors:Hypertension, Diabetes                  and Dyslipidemia.  Sonographer:     Sherrie Sport RDCS (AE) Referring Phys:  [id number] Kate Sable Diagnosing Phys: Kate Sable MD IMPRESSIONS  1. Left ventricular ejection fraction, by estimation, is 45 to 50%. The left ventricle has mildly decreased function. The left ventricle demonstrates global hypokinesis. Left ventricular diastolic parameters are consistent with Grade II diastolic dysfunction (pseudonormalization).  2. Right ventricular systolic function is normal. The right ventricular size is normal. There is mildly elevated pulmonary artery systolic pressure.  3. Left atrial size was mildly dilated.  4. There is a 12 x 15 mm mobile mass (clips 61 - 65) in the roof of the right atrium. This could be a thrombus or vegetation depending on clinical context.  5. The mitral valve is normal in structure. Mild to moderate mitral valve regurgitation.  6. The aortic valve is tricuspid. Aortic valve regurgitation is mild. Mild aortic valve sclerosis is present, with no evidence of aortic valve stenosis.  7. The inferior vena cava is normal in size with greater than 50% respiratory variability, suggesting right atrial pressure of 3 mmHg. FINDINGS  Left Ventricle: Left ventricular ejection fraction, by estimation, is 45 to 50%. The left ventricle has mildly decreased function. The left ventricle demonstrates global hypokinesis. The left ventricular internal cavity size was normal in size. There is  no left ventricular hypertrophy. Left ventricular diastolic parameters are consistent with Grade II diastolic dysfunction (pseudonormalization). Right Ventricle: The right ventricular size is normal. No increase in right ventricular wall thickness. Right ventricular  systolic function is normal. There is mildly elevated pulmonary artery systolic pressure. The tricuspid regurgitant velocity is 3.11  m/s, and with an assumed right atrial pressure of 3 mmHg, the estimated right ventricular systolic pressure is 16.9 mmHg. Left Atrium: Left atrial size was mildly dilated. Right Atrium: There is a 12 x 15 mm mobile mass (clips 61 - 65) in the roof of the right atrium. This could be a thrombus or vegetation depending on clinical context. Right atrial size was normal in size. Pericardium: There is no evidence of pericardial effusion. Mitral Valve: The mitral valve is normal in structure. Mild to moderate mitral valve regurgitation. Tricuspid Valve: The tricuspid valve is normal in structure. Tricuspid valve regurgitation is mild. Aortic Valve: The aortic valve is tricuspid. Aortic valve regurgitation is mild. Aortic regurgitation PHT measures 357 msec. Mild aortic valve sclerosis is present, with no evidence of aortic valve stenosis. Aortic valve mean gradient measures 4.7 mmHg. Aortic valve peak gradient measures 8.4 mmHg. Aortic valve area, by VTI measures 2.22 cm. Pulmonic Valve: The pulmonic valve was not well visualized. Pulmonic valve regurgitation is not visualized. Aorta: The aortic root is normal in size and structure. Venous: The inferior vena cava is normal in size with greater than 50% respiratory variability, suggesting  right atrial pressure of 3 mmHg. IAS/Shunts: No atrial level shunt detected by color flow Doppler.  LEFT VENTRICLE PLAX 2D LVIDd:         5.23 cm  Diastology LVIDs:         3.44 cm  LV e' lateral:   7.51 cm/s LV PW:         1.28 cm  LV E/e' lateral: 16.2 LV IVS:        0.89 cm  LV e' medial:    7.07 cm/s LVOT diam:     2.00 cm  LV E/e' medial:  17.3 LV SV:         48 LV SV Index:   32 LVOT Area:     3.14 cm  RIGHT VENTRICLE RV Basal diam:  3.09 cm RV S prime:     8.16 cm/s TAPSE (M-mode): 2.9 cm LEFT ATRIUM             Index       RIGHT ATRIUM            Index LA diam:        4.50 cm 3.00 cm/m  RA Area:     14.20 cm LA Vol (A2C):   54.1 ml 36.02 ml/m RA Volume:   33.50 ml  22.31 ml/m LA Vol (A4C):   55.9 ml 37.22 ml/m LA Biplane Vol: 56.6 ml 37.69 ml/m  AORTIC VALVE                   PULMONIC VALVE AV Area (Vmax):    1.79 cm    PV Vmax:        0.73 m/s AV Area (Vmean):   1.88 cm    PV Peak grad:   2.1 mmHg AV Area (VTI):     2.22 cm    RVOT Peak grad: 3 mmHg AV Vmax:           145.33 cm/s AV Vmean:          95.467 cm/s AV VTI:            0.215 m AV Peak Grad:      8.4 mmHg AV Mean Grad:      4.7 mmHg LVOT Vmax:         82.80 cm/s LVOT Vmean:        57.000 cm/s LVOT VTI:          0.152 m LVOT/AV VTI ratio: 0.71 AI PHT:            357 msec  AORTA Ao Root diam: 2.90 cm MITRAL VALVE                TRICUSPID VALVE MV Area (PHT): 4.10 cm     TR Peak grad:   38.7 mmHg MV Decel Time: 185 msec     TR Vmax:        311.00 cm/s MV E velocity: 122.00 cm/s MV A velocity: 48.50 cm/s   SHUNTS MV E/A ratio:  2.52         Systemic VTI:  0.15 m                             Systemic Diam: 2.00 cm Kate Sable MD Electronically signed by Kate Sable MD Signature Date/Time: 03/29/2020/4:32:04 PM    Final    US Abdomen Limited RUQ  Result Date: 03/12/2020 CLINICAL DATA:  Multi organ failure elevated alk-phos EXAM:  ULTRASOUND ABDOMEN LIMITED RIGHT UPPER QUADRANT COMPARISON:  CT 03/21/2020 FINDINGS: Gallbladder: Surgically absent Common bile duct: Diameter: 4.3 mm Liver: No focal hepatic abnormality. Slightly lobulated liver contour. Portal vein is patent on color Doppler imaging with normal direction of blood flow towards the liver. Other: Small amount of ascites in the right abdomen inferior to the liver. Incidental note made of right pleural effusion. IMPRESSION: 1. Status post cholecystectomy.  No biliary dilatation 2. Slightly lobulated liver contour, question cirrhosis. Small amount of ascites in the right upper quadrant. Right pleural effusion. Electronically  Signed   By: Donavan Foil M.D.   On: 04/03/2020 20:24    Cardiac Studies   TTE (03/27/2020): 1. Left ventricular ejection fraction, by estimation, is 45 to 50%. The  left ventricle has mildly decreased function. The left ventricle  demonstrates global hypokinesis. Left ventricular diastolic parameters are  consistent with Grade II diastolic  dysfunction (pseudonormalization).  2. Right ventricular systolic function is normal. The right ventricular  size is normal. There is mildly elevated pulmonary artery systolic  pressure.  3. Left atrial size was mildly dilated.  4. There is a 12 x 15 mm mobile mass (clips 61 - 65) in the roof of the  right atrium. This could be a thrombus or vegetation depending on clinical  context.  5. The mitral valve is normal in structure. Mild to moderate mitral valve  regurgitation.  6. The aortic valve is tricuspid. Aortic valve regurgitation is mild.  Mild aortic valve sclerosis is present, with no evidence of aortic valve  stenosis.  7. The inferior vena cava is normal in size with greater than 50%  respiratory variability, suggesting right atrial pressure of 3 mmHg.   Patient Profile     39 y.o. female with h/o ESRD on HD, bilateral BKA, hypertension, DM, and stroke with left-sided weakness, admitted with altered mental status.  Assessment & Plan    Elevated troponin: Still rising on last check yesterday evening (HS-TnI 2,270).  Unclear if this reflects plaque rupture, microvascular disease, supply-demand mismatch, or myocarditis, given multiorgan failure.  The patient is not a candidate for invasive procedures at this time due to her altered mental status, coagulopathy, and severe thrombocytopenia.  Continue supportive care.  No plans for intervention.  Medical therapy limited by multiorgan failure.  Cardiomyopathy: Mildly reduced LVEF noted by echo.  Unable to add BB and ACEI/ARB in the setting of AMS and soft blood pressure.  Right  atrial mass: Incidentally noted on echo.  Images personally reviewed; considerations include HD catheter tip, thrombus, or mass (less likely).  Cross-sectional imaging may be helpful.  In particular, thrombus in transit could be a consideration if the patient has developed HIT or DIC.  ESRD:  Per nephrology.  Thrombocytopenia and coagulopathy: Likely multifactorial but potentially complicated by DIC or HIT (uncertain what heparin exposure has been recently, though with HD, she is likely chronically exposed).  Fibrin split products severely elevated.  Bilirubin also quite high, though this is predominantly direct, which argues more for liver disease.  Consider checking LDH/haptoglobin/HIT panel.  Consider hematology consultation.  Avoid heparin and antiplatelet agents.  For questions or updates, please contact [place] Please consult [email or web address] for contact info under [facility].     Signed, Nelva Bush, MD  2020-04-06, 1:28 PM

## 2020-04-10 NOTE — Progress Notes (Signed)
Patient received an additional 200 mEq of sodium bicarbonate IV push due to acidosis shown on new VBG results.   Additonally, this patient has not put out any more urine since the I&O cath earlier this shift. Hinton Dyer, NP aware. No new orders at this time.  Cameron Ali, RN

## 2020-04-10 NOTE — Progress Notes (Signed)
CRITICAL CARE NOTE 39 y.o.femalewith PMH as noted below including ESRD on dialysis, diabetes, bilateral BKA's, and other PMH as noted below who presents with altered mental status.   Patient with [place] NOW DNR/DNI  6/14 admitted to ICU for encephalopathy and liver failure 6/15 remains obtunded, sister was found, now DNR/DNI 6/15 dx of NSTEMI, liver failure, ESRD ON HD 6/15 severe acidosis, needs HD, NEURO WORK UP PENDING     CC  +liver failure +encephloopathy  SUBJECTIVE Remains critically ill multiorgan failure +NSTEMI +liver failure ESRD on HD Remains obtunded MRI and EEG pending Very poor prognosis    BP 107/68   Pulse 86   Temp 97.8 F (36.6 C) (Axillary)   Resp (!) 22   Ht 4' (1.219 m)   Wt 79.4 kg   SpO2 97%   BMI 53.42 kg/m    I/O last 3 completed shifts: In: 751.8 [I.V.:251.8; IV Piggyback:500] Out: 10 [Urine:10] No intake/output data recorded.  SpO2: 97 %  Estimated body mass index is 53.42 kg/m as calculated from the following:   Height as of this encounter: 4' (1.219 m).   Weight as of this encounter: 79.4 kg.  REVIEW OF SYSTEMS  PATIENT IS UNABLE TO PROVIDE COMPLETE REVIEW OF SYSTEM S DUE TO SEVERE CRITICAL ILLNESS AND ENCEPHALOPATHY  PHYSICAL EXAMINATION:  GENERAL:critically ill appearing, +resp distress +jaundice HEAD: Normocephalic, atraumatic.  EYES: Pupils equal, round, reactive to light.  No scleral icterus.  MOUTH: Moist mucosal membrane. NECK: Supple. No thyromegaly. No nodules. No JVD.  PULMONARY: +rhonchi, +wheezing CARDIOVASCULAR: S1 and S2. Regular rate and rhythm. No murmurs, rubs, or gallops.  GASTROINTESTINAL: Soft, nontender, -distended. Positive bowel sounds.  MUSCULOSKELETAL: No swelling, clubbing, or edema.  NEUROLOGIC: obtunded SKIN:intact,warm,dry    MEDICATIONS: I have reviewed all medications and confirmed regimen as documented   CULTURE RESULTS   Recent Results (from the past 240  hour(s))  SARS Coronavirus 2 by RT PCR (hospital order, performed in [facility] hospital lab) Nasopharyngeal Nasopharyngeal Swab     Status: None   Collection Time: 03/20/2020 11:55 AM   Specimen: Nasopharyngeal Swab  Result Value Ref Range Status   SARS Coronavirus 2 NEGATIVE NEGATIVE Final    Comment: (NOTE) SARS-CoV-2 target nucleic acids are NOT DETECTED.  The SARS-CoV-2 RNA is generally detectable in upper and lower respiratory specimens during the acute phase of infection. The lowest concentration of SARS-CoV-2 viral copies this assay can detect is 250 copies / mL. A negative result does not preclude SARS-CoV-2 infection and should not be used as the sole basis for treatment or other patient management decisions.  A negative result may occur with improper specimen collection / handling, submission of specimen other than nasopharyngeal swab, presence of viral mutation(s) within the areas targeted by this assay, and inadequate number of viral copies (<250 copies / mL). A negative result must be combined with clinical observations, patient history, and epidemiological information.  Fact Sheet for Patients:   StrictlyIdeas.no  Fact Sheet for Healthcare Providers: BankingDealers.co.za  This test is not yet approved or  cleared by the [place] FDA and has been authorized for detection and/or diagnosis of SARS-CoV-2 by FDA under an Emergency Use Authorization (EUA).  This EUA will remain in effect (meaning this test can be used) for the duration of the COVID-19 declaration under Section 564(b)(1) of the Act, 21 U.S.C. section 360bbb-3(b)(1), unless the authorization is terminated or revoked sooner.  Performed at [facility], [address], [place], [place] [address]  MRSA PCR Screening     Status: None   Collection Time: 03/12/2020  1:00 PM   Specimen: Nasopharyngeal  Result Value Ref Range Status   MRSA by PCR  NEGATIVE NEGATIVE Final    Comment:        The GeneXpert MRSA Assay (FDA approved for NASAL specimens only), is one component of a comprehensive MRSA colonization surveillance program. It is not intended to diagnose MRSA infection nor to guide or monitor treatment for MRSA infections. Performed at [facility], [place], [place] [address]           IMAGING    CT Head Wo Contrast  Result Date: 03/13/2020 CLINICAL DATA:  Altered mental status EXAM: CT HEAD WITHOUT CONTRAST TECHNIQUE: Contiguous axial images were obtained from the base of the skull through the vertex without intravenous contrast. COMPARISON:  November 29, 2019 FINDINGS: Brain: The ventricles and sulci are normal in size and configuration. There is no intracranial mass, hemorrhage, extra-axial fluid collection, or midline shift. There are prior infarcts in each posterosuperior parietal lobe as well as in the medial occipital lobes bilaterally and in the mid right occipital lobe. No acute appearing infarct is demonstrable on this study. Vascular: No hyperdense vessels. There are is slight calcification in each carotid siphon region. Skull: The bony calvarium appears intact. Sinuses/Orbits: Visualized paranasal sinuses are clear. Orbits appear symmetric bilaterally. Other: Mastoid air cells are clear. IMPRESSION: Scattered prior infarcts in the occipital and parietal lobes bilaterally. No acute appearing infarct is evident. No appreciable mass or hemorrhage. Slight calcification noted in each carotid siphon. Electronically Signed   By: Lowella Grip III M.D.   On: 03/14/2020 11:04   DG Chest Port 1 View  Result Date: March 29, 2020 CLINICAL DATA:  Unresponsive EXAM: PORTABLE CHEST 1 VIEW COMPARISON:  04/09/2020 FINDINGS: Cardiomegaly. Right dialysis catheter remains in place, unchanged. Mild vascular congestion. Small bilateral effusions again noted, unchanged. Interstitial and airspace opacities in the  lower lungs could reflect edema. IMPRESSION: Cardiomegaly. Suspect mild pulmonary edema. Small bilateral effusions. Electronically Signed   By: Rolm Baptise M.D.   On: 03/29/2020 01:15   DG Chest Portable 1 View  Result Date: 04/06/2020 CLINICAL DATA:  Altered mental status with weakness EXAM: PORTABLE CHEST 1 VIEW COMPARISON:  Three days ago FINDINGS: Cardiomegaly. Dialysis catheter on the right with tip at the right atrium. Small pleural effusions and vascular congestion. IMPRESSION: Small pleural effusions and vascular congestion. Electronically Signed   By: Monte Fantasia M.D.   On: 03/26/2020 11:10   ECHOCARDIOGRAM COMPLETE  Result Date: 03/24/2020    ECHOCARDIOGRAM REPORT   Patient Name:   Kathy Phelps Date of Exam: 03/23/2020 Medical Rec #:  [id number]       Height:       48.0 in Accession #:    [id number]      Weight:       175.5 lb Date of Birth:  [birth year]        BSA:          1.502 m Patient Age:    36 years        BP:           120/77 mmHg Patient Gender: F               HR:           98 bpm. Exam Location:  [facility] Procedure: 2D Echo, Cardiac Doppler and Color Doppler Indications:     Elevated  troponin  History:         Patient has prior history of Echocardiogram examinations, most                  recent 11/01/2019. Stroke; Risk Factors:Hypertension, Diabetes                  and Dyslipidemia.  Sonographer:     Sherrie Sport RDCS (AE) Referring Phys:  [id number] Kate Sable Diagnosing Phys: Kate Sable MD IMPRESSIONS  1. Left ventricular ejection fraction, by estimation, is 45 to 50%. The left ventricle has mildly decreased function. The left ventricle demonstrates global hypokinesis. Left ventricular diastolic parameters are consistent with Grade II diastolic dysfunction (pseudonormalization).  2. Right ventricular systolic function is normal. The right ventricular size is normal. There is mildly elevated pulmonary artery systolic pressure.  3. Left atrial size was mildly dilated.  4.  There is a 12 x 15 mm mobile mass (clips 61 - 65) in the roof of the right atrium. This could be a thrombus or vegetation depending on clinical context.  5. The mitral valve is normal in structure. Mild to moderate mitral valve regurgitation.  6. The aortic valve is tricuspid. Aortic valve regurgitation is mild. Mild aortic valve sclerosis is present, with no evidence of aortic valve stenosis.  7. The inferior vena cava is normal in size with greater than 50% respiratory variability, suggesting right atrial pressure of 3 mmHg. FINDINGS  Left Ventricle: Left ventricular ejection fraction, by estimation, is 45 to 50%. The left ventricle has mildly decreased function. The left ventricle demonstrates global hypokinesis. The left ventricular internal cavity size was normal in size. There is  no left ventricular hypertrophy. Left ventricular diastolic parameters are consistent with Grade II diastolic dysfunction (pseudonormalization). Right Ventricle: The right ventricular size is normal. No increase in right ventricular wall thickness. Right ventricular systolic function is normal. There is mildly elevated pulmonary artery systolic pressure. The tricuspid regurgitant velocity is 3.11  m/s, and with an assumed right atrial pressure of 3 mmHg, the estimated right ventricular systolic pressure is 06.2 mmHg. Left Atrium: Left atrial size was mildly dilated. Right Atrium: There is a 12 x 15 mm mobile mass (clips 61 - 65) in the roof of the right atrium. This could be a thrombus or vegetation depending on clinical context. Right atrial size was normal in size. Pericardium: There is no evidence of pericardial effusion. Mitral Valve: The mitral valve is normal in structure. Mild to moderate mitral valve regurgitation. Tricuspid Valve: The tricuspid valve is normal in structure. Tricuspid valve regurgitation is mild. Aortic Valve: The aortic valve is tricuspid. Aortic valve regurgitation is mild. Aortic regurgitation PHT measures  357 msec. Mild aortic valve sclerosis is present, with no evidence of aortic valve stenosis. Aortic valve mean gradient measures 4.7 mmHg. Aortic valve peak gradient measures 8.4 mmHg. Aortic valve area, by VTI measures 2.22 cm. Pulmonic Valve: The pulmonic valve was not well visualized. Pulmonic valve regurgitation is not visualized. Aorta: The aortic root is normal in size and structure. Venous: The inferior vena cava is normal in size with greater than 50% respiratory variability, suggesting right atrial pressure of 3 mmHg. IAS/Shunts: No atrial level shunt detected by color flow Doppler.  LEFT VENTRICLE PLAX 2D LVIDd:         5.23 cm  Diastology LVIDs:         3.44 cm  LV e' lateral:   7.51 cm/s LV PW:  1.28 cm  LV E/e' lateral: 16.2 LV IVS:        0.89 cm  LV e' medial:    7.07 cm/s LVOT diam:     2.00 cm  LV E/e' medial:  17.3 LV SV:         48 LV SV Index:   32 LVOT Area:     3.14 cm  RIGHT VENTRICLE RV Basal diam:  3.09 cm RV S prime:     8.16 cm/s TAPSE (M-mode): 2.9 cm LEFT ATRIUM             Index       RIGHT ATRIUM           Index LA diam:        4.50 cm 3.00 cm/m  RA Area:     14.20 cm LA Vol (A2C):   54.1 ml 36.02 ml/m RA Volume:   33.50 ml  22.31 ml/m LA Vol (A4C):   55.9 ml 37.22 ml/m LA Biplane Vol: 56.6 ml 37.69 ml/m  AORTIC VALVE                   PULMONIC VALVE AV Area (Vmax):    1.79 cm    PV Vmax:        0.73 m/s AV Area (Vmean):   1.88 cm    PV Peak grad:   2.1 mmHg AV Area (VTI):     2.22 cm    RVOT Peak grad: 3 mmHg AV Vmax:           145.33 cm/s AV Vmean:          95.467 cm/s AV VTI:            0.215 m AV Peak Grad:      8.4 mmHg AV Mean Grad:      4.7 mmHg LVOT Vmax:         82.80 cm/s LVOT Vmean:        57.000 cm/s LVOT VTI:          0.152 m LVOT/AV VTI ratio: 0.71 AI PHT:            357 msec  AORTA Ao Root diam: 2.90 cm MITRAL VALVE                TRICUSPID VALVE MV Area (PHT): 4.10 cm     TR Peak grad:   38.7 mmHg MV Decel Time: 185 msec     TR Vmax:        311.00  cm/s MV E velocity: 122.00 cm/s MV A velocity: 48.50 cm/s   SHUNTS MV E/A ratio:  2.52         Systemic VTI:  0.15 m                             Systemic Diam: 2.00 cm Kate Sable MD Electronically signed by Kate Sable MD Signature Date/Time: 03/26/2020/4:32:04 PM    Final    US Abdomen Limited RUQ  Result Date: 04/09/2020 CLINICAL DATA:  Multi organ failure elevated alk-phos EXAM: ULTRASOUND ABDOMEN LIMITED RIGHT UPPER QUADRANT COMPARISON:  CT 03/21/2020 FINDINGS: Gallbladder: Surgically absent Common bile duct: Diameter: 4.3 mm Liver: No focal hepatic abnormality. Slightly lobulated liver contour. Portal vein is patent on color Doppler imaging with normal direction of blood flow towards the liver. Other: Small amount of ascites in the right abdomen inferior to the liver. Incidental note made of  right pleural effusion. IMPRESSION: 1. Status post cholecystectomy.  No biliary dilatation 2. Slightly lobulated liver contour, question cirrhosis. Small amount of ascites in the right upper quadrant. Right pleural effusion. Electronically Signed   By: Donavan Foil M.D.   On: 03/11/2020 20:24        ASSESSMENT AND PLAN SYNOPSIS  39 yo female admitted to ICU for severe encephalopathy from acute liver failure complicated by end stage renal failure and acute cardiac CHF and acute NSTEMI  NEUROLOGY-h/o CVA's Acute toxic metabolic encephalopathy Needs MRI and EEG neurology to be consulted  Acute NSTEMI Follow up Cardiology recs  ACUTE SYSTOLIC CARDIAC FAILURE- EF 40% -follow up cardiac enzymes as indicated -follow up cardiology recs  ESRD on HD/KIDNEY INJURY/Renal Failure -continue Foley Catheter-assess need -Avoid nephrotoxic agents -Follow urine output, BMP -Ensure adequate renal perfusion, optimize oxygenation -Renal dose medications Follow up Nephrology recs  CARDIAC ICU monitoring  ID -continue IV abx as prescibed -follow up cultures   DIET-->NPO Constipation  protocol as indicated  ENDO - ICU hypoglycemic\Hyperglycemia protocol -check FSBS per protocol    ELECTROLYTES -follow labs as needed -replace as needed -pharmacy consultation and following     DVT/GI PRX ordered and assessed TRANSFUSIONS AS NEEDED MONITOR FSBS I Assessed the need for Labs I Assessed the need for Foley I Assessed the need for Central Venous Line Family Discussion when available I made an Assessment of medications to be adjusted accordingly Safety Risk assessment completed  CASE DISCUSSED IN MULTIDISCIPLINARY ROUNDS WITH ICU TEAM   Critical Care Time devoted to patient care services described in this note is 35 minutes.   Overall, patient is critically ill, prognosis is guarded.  Patient with Multiorgan failure and at high risk for cardiac arrest and death.   Patient is now DNR/DNI Palliative care team following  Corrin Parker, M.D.  [facility]  Medical Director [place] Director [facility] Cardio-Pulmonary Department

## 2020-04-10 NOTE — Progress Notes (Signed)
The Clinical status was relayed to family in detail. Sister Kathy Phelps at bedside  Updated and notified of patients medical condition.  Patient remains unresponsive and will not open eyes to command.   patient with increased WOB and using accessory muscles to breathe Explained to family course of therapy and the modalities     MRI c/w acute CVA's along with possible intracardiac thrombus.    Patient with Progressive multiorgan failure with very low chance of meaningful recovery despite all aggressive and optimal medical therapy.  Patient is in the dying  Process associated with suffering.  Family understands the situation.  They have consented and agreed to DNR/DNI and would like to proceed with Comfort care measures.  We will wait for family to arrive and proceed with CMO At this time, will start Morphine 2 mg every 15 mins as needed, then will progress to morphine infusion if needed when family arrives  Family are satisfied with Plan of action and management. All questions answered  Additional CC time 31 mins   French Kendra Patricia Pesa, M.D.  [facility]  Medical Director [place] Director [facility] Cardio-Pulmonary Department

## 2020-04-10 NOTE — Progress Notes (Signed)
eeg completed ° °

## 2020-04-10 NOTE — Procedures (Signed)
Patient Name: ADANELY REYNOSO  MRN: [id number]  Epilepsy Attending: Lora Havens  Referring Physician/Provider: Dr. Flora Lipps Date: 04-17-20 Duration: 22.30 minutes  Patient history: 39 year old female presented with altered mental status and noted to have roving eye movements.  EEG today for seizures.  Level of alertness:  comatose  AEDs during EEG study: None  Technical aspects: This EEG study was done with scalp electrodes positioned according to the 10-20 International system of electrode placement. Electrical activity was acquired at a sampling rate of 500Hz  and reviewed with a high frequency filter of 70Hz  and a low frequency filter of 1Hz . EEG data were recorded continuously and digitally stored.   Description: EEG showed continuous generalized background attenuation.  EEG was not reactive to noxious stimulation.  Significant respiratory artifact was seen throughout the study. Hyperventilation and photic stimulation were not performed.     ABNORMALITY -Background attenuation, generalized  IMPRESSION: This study is suggestive of profound diffuse encephalopathy, nonspecific etiology.  No seizures or epileptiform discharges were seen throughout the recording.  [place] Thelin [name]

## 2020-04-10 NOTE — Consult Note (Addendum)
Reason for Consult: AMS  Requesting Physician: Dr. Mortimer Fries  CC: AMS   HPI: Kathy Phelps is an 39 y.o. 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 At baseline pt is alert and able to interact. Currently roving eye movements, abnormal respirations and not following commands.    Past Medical History:  Diagnosis Date  . Anxiety   . Asthma   . Bipolar affective ([place])   . Complete miscarriage   . Constipation   . 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 Depression   . Diabetes mellitus    Type II  . Dyspnea     " only at night and I stop breathing in my sleep too for about the last three weeks"  . GERD (gastroesophageal reflux disease)   . Headache    "migraraines"  . Hyperlipidemia   . Hypertension   . Osteomyelitis ([place])    left foot  . Pregnancy complication    HELP Syndrome  . Renal disorder   . Schizophrenia ([place])   . Stroke [facility])    " mild stroke", memory loss- approx 2017    Past Surgical History:  Procedure Laterality Date  . ABDOMINAL AORTOGRAM N/A 09/07/2017   Procedure: ABDOMINAL AORTOGRAM;  Surgeon: Waynetta Sandy, MD;  Location: [place] [facility];  Service: Cardiovascular;  Laterality: N/A;  . ABDOMINAL AORTOGRAM W/LOWER EXTREMITY N/A 02/03/2017   Procedure: Abdominal Aortogram w/Lower Extremity;  Surgeon: Waynetta Sandy, MD;  Location: [place] [facility];  Service: Cardiovascular;  Laterality: N/A;  . ABDOMINAL AORTOGRAM W/LOWER EXTREMITY N/A 06/27/2018   Procedure: ABDOMINAL AORTOGRAM W/LOWER EXTREMITY;  Surgeon: Serafina Mitchell, MD;  Location: [place] [facility];  Service: Cardiovascular;  Laterality: N/A;  . AMPUTATION Left 02/10/2017   Procedure: AMPUTATION TOES 3, 4 AND 5  LEFT FOOT;  Surgeon: Waynetta Sandy, MD;  Location: [place];  Service: Vascular;  Laterality: Left;  . AMPUTATION Left 12/21/2017    Procedure: LEFT FOOT 5TH RAY AMPUTATION;  Surgeon: Newt Minion, MD;  Location: [place];  Service: Orthopedics;  Laterality: Left;  . AMPUTATION Left 02/01/2018   Procedure: LEFT BELOW KNEE AMPUTATION;  Surgeon: Newt Minion, MD;  Location: [place];  Service: Orthopedics;  Laterality: Left;  . AMPUTATION Right 08/11/2018   Procedure: AMPUTATION BELOW KNEE;  Surgeon: Newt Minion, MD;  Location: [place];  Service: Orthopedics;  Laterality: Right;  . CESAREAN SECTION    . CHOLECYSTECTOMY    . LOWER EXTREMITY ANGIOGRAPHY Bilateral 09/07/2017   Procedure: Lower Extremity Angiography;  Surgeon: Waynetta Sandy, MD;  Location: [place] [facility];  Service: Cardiovascular;  Laterality: Bilateral;  . PERIPHERAL VASCULAR ATHERECTOMY  02/03/2017   Procedure: Peripheral Vascular Atherectomy;  Surgeon: Waynetta Sandy, MD;  Location: [place] [facility];  Service: Cardiovascular;;  . PERIPHERAL VASCULAR ATHERECTOMY Right 06/27/2018   Procedure: PERIPHERAL VASCULAR ATHERECTOMY;  Surgeon: Serafina Mitchell, MD;  Location: [place] [facility];  Service: Cardiovascular;  Laterality: Right;  superficial femoral  . PERIPHERAL VASCULAR BALLOON ANGIOPLASTY  02/03/2017   Procedure: Peripheral Vascular Balloon Angioplasty;  Surgeon: Waynetta Sandy, MD;  Location: [place] [facility];  Service: Cardiovascular;;  . PERIPHERAL VASCULAR INTERVENTION Left 09/07/2017   Procedure: PERIPHERAL VASCULAR INTERVENTION;  Surgeon: Waynetta Sandy, MD;  Location: [place] [facility];  Service: Cardiovascular;  Laterality: Left;  SFA/POPLITEAL  . STUMP REVISION Left 05/24/2018  . STUMP REVISION Left 05/24/2018   Procedure: REVISION LEFT BELOW KNEE AMPUTATION;  Surgeon: Newt Minion, MD;  Location: [place];  Service: Orthopedics;  Laterality: Left;  . TUBAL LIGATION      Family History  Problem Relation Age of Onset  . Diabetes Mellitus II Mother   . Heart disease Mother   . Heart disease Father      Social History:  reports that she has been smoking cigarettes. She has a 28.00 pack-year smoking history. She has never used smokeless tobacco. She reports that she does not drink alcohol and does not use drugs.  Allergies  Allergen Reactions  . Hydrocodone-Acetaminophen Hives  . Tylenol [Acetaminophen] Itching and Swelling    Patient tolerated APAP 650 mg (06/29/18) as well as oxycodone/APAP 5-325 mg during 06/25/2018 admission ??    Medications: I have reviewed the patient's current medications.  ROS: Unable to obtain as not following commands   Physical Examination: Blood pressure 107/68, pulse 86, temperature 97.8 F (36.6 C), temperature source Axillary, resp. rate (!) 22, height 4' (1.219 m), weight 79.4 kg, SpO2 97 %.  Pt is not awake Does not withdraw from painful stimuli Apneic breathing episodes Pupils/corneals present Motor/sensory could not be examined.     Laboratory Studies:   Basic Metabolic Panel: Recent Labs  Lab 03/21/20 1504 03/21/20 1504 03/30/2020 1033 03/13/2020 1956 2020-03-31 0416 03/31/20 0932  NA 123*  --  124*  --  PENDING 129*  K 4.9  --  5.5* 6.2* 6.5* 6.6*  CL 89*  --  89*  --  90* 90*  CO2 20*  --  17*  --  9* 11*  GLUCOSE 101*  --  72  --  100* 74  BUN 47*  --  74*  --  82* 83*  CREATININE 5.83*  --  7.23*  --  7.33* 7.38*  CALCIUM 7.8*   < > 7.8*  --  7.7* 7.5*  MG  --   --   --  2.2 2.3  --   PHOS  --   --   --   --  7.4*  --    < > = values in this interval not displayed.    Liver Function Tests: Recent Labs  Lab 03/21/20 1504 03/28/2020 1033 31-Mar-2020 0932  AST 20 44* 74*  ALT '9 10 11  ' ALKPHOS 211* 252* 170*  BILITOT 4.0* 12.2* 13.3*  PROT 6.0* 6.0* 5.1*  ALBUMIN 2.4* 2.2* 1.9*   Recent Labs  Lab 03/21/20 1504 04/07/2020 1033  LIPASE 14 16   Recent Labs  Lab 03/17/2020 1033  AMMONIA 42*    CBC: Recent Labs  Lab 03/21/20 1504 03/12/2020 1033 03/31/2020 0416  WBC 6.7 15.4* 19.6*  NEUTROABS 6.2 12.4*  --   HGB 10.8*  11.0* 10.2*  HCT 32.4* 30.9* 29.7*  MCV 91.3 84.9 89.2  PLT 96* 22* 14*    Cardiac Enzymes: No results for input(s): CKTOTAL, CKMB, CKMBINDEX, TROPONINI in the last 168 hours.  BNP: Invalid input(s): POCBNP  CBG: Recent Labs  Lab 03/31/2020 1612 03/12/2020 2324 04/03/2020 2355 2020/03/31 0341 03-31-20 0750  GLUCAP 97 49* 124* 102* 83    Microbiology: Results for orders placed or performed during the hospital encounter of 03/17/2020  SARS Coronavirus 2 by RT PCR (hospital order, performed in [facility] hospital lab) Nasopharyngeal Nasopharyngeal Swab     Status: None   Collection Time: 03/11/2020 11:55 AM  Specimen: Nasopharyngeal Swab  Result Value Ref Range Status   SARS Coronavirus 2 NEGATIVE NEGATIVE Final    Comment: (NOTE) SARS-CoV-2 target nucleic acids are NOT DETECTED.  The SARS-CoV-2 RNA is generally detectable in upper and lower respiratory specimens during the acute phase of infection. The lowest concentration of SARS-CoV-2 viral copies this assay can detect is 250 copies / mL. A negative result does not preclude SARS-CoV-2 infection and should not be used as the sole basis for treatment or other patient management decisions.  A negative result may occur with improper specimen collection / handling, submission of specimen other than nasopharyngeal swab, presence of viral mutation(s) within the areas targeted by this assay, and inadequate number of viral copies (<250 copies / mL). A negative result must be combined with clinical observations, patient history, and epidemiological information.  Fact Sheet for Patients:   StrictlyIdeas.no  Fact Sheet for Healthcare Providers: BankingDealers.co.za  This test is not yet approved or  cleared by the [place] FDA and has been authorized for detection and/or diagnosis of SARS-CoV-2 by FDA under an Emergency Use Authorization (EUA).  This EUA will remain in effect (meaning  this test can be used) for the duration of the COVID-19 declaration under Section 564(b)(1) of the Act, 21 U.S.C. section 360bbb-3(b)(1), unless the authorization is terminated or revoked sooner.  Performed at [facility], [address] [place], [place] [address]   MRSA PCR Screening     Status: None   Collection Time: 03/28/2020  1:00 PM   Specimen: Nasopharyngeal  Result Value Ref Range Status   MRSA by PCR NEGATIVE NEGATIVE Final    Comment:        The GeneXpert MRSA Assay (FDA approved for NASAL specimens only), is one component of a comprehensive MRSA colonization surveillance program. It is not intended to diagnose MRSA infection nor to guide or monitor treatment for MRSA infections. Performed at [facility], [address] [place], [place] [address]     Coagulation Studies: Recent Labs    04/04/2020 1033  LABPROT 16.9*  INR 1.4*    Urinalysis:  Recent Labs  Lab 03/15/2020 1033  COLORURINE AMBER*  LABSPEC 1.010  PHURINE 7.0  GLUCOSEU NEGATIVE  HGBUR NEGATIVE  BILIRUBINUR NEGATIVE  KETONESUR NEGATIVE  PROTEINUR >=300*  NITRITE NEGATIVE  LEUKOCYTESUR TRACE*    Lipid Panel:     Component Value Date/Time   CHOL 91 12/01/2019 0247   TRIG 114 12/01/2019 0247   HDL 35 (L) 12/01/2019 0247   CHOLHDL 2.6 12/01/2019 0247   VLDL 23 12/01/2019 0247   LDLCALC 33 12/01/2019 0247    HgbA1C:  Lab Results  Component Value Date   HGBA1C 5.2 12/01/2019    Urine Drug Screen:      Component Value Date/Time   LABOPIA NONE DETECTED 03/21/2020 1033   LABOPIA NONE DETECTED 11/29/2019 0717   COCAINSCRNUR NONE DETECTED 03/11/2020 1033   COCAINSCRNUR NEG 05/20/2009 2121   LABBENZ POSITIVE (A) 04/06/2020 1033   LABBENZ NONE DETECTED 11/29/2019 0717   LABBENZ NEG 05/20/2009 2121   AMPHETMU NONE DETECTED 03/19/2020 1033   AMPHETMU NONE DETECTED 11/29/2019 0717   THCU NONE DETECTED 04/02/2020 1033   THCU NONE DETECTED 11/29/2019 0717   LABBARB  NONE DETECTED 03/17/2020 1033   LABBARB NONE DETECTED 11/29/2019 0717    Alcohol Level:  Recent Labs  Lab 03/21/20 1504 03/19/2020 1033  ETH <10 <10    Other results: Imaging: CT Head Wo Contrast  Result Date: 04/08/2020  CLINICAL DATA:  Altered mental status EXAM: CT HEAD WITHOUT CONTRAST TECHNIQUE: Contiguous axial images were obtained from the base of the skull through the vertex without intravenous contrast. COMPARISON:  November 29, 2019 FINDINGS: Brain: The ventricles and sulci are normal in size and configuration. There is no intracranial mass, hemorrhage, extra-axial fluid collection, or midline shift. There are prior infarcts in each posterosuperior parietal lobe as well as in the medial occipital lobes bilaterally and in the mid right occipital lobe. No acute appearing infarct is demonstrable on this study. Vascular: No hyperdense vessels. There are is slight calcification in each carotid siphon region. Skull: The bony calvarium appears intact. Sinuses/Orbits: Visualized paranasal sinuses are clear. Orbits appear symmetric bilaterally. Other: Mastoid air cells are clear. IMPRESSION: Scattered prior infarcts in the occipital and parietal lobes bilaterally. No acute appearing infarct is evident. No appreciable mass or hemorrhage. Slight calcification noted in each carotid siphon. Electronically Signed   By: Lowella Grip III M.D.   On: 03/30/2020 11:04   DG Chest Port 1 View  Result Date: Apr 15, 2020 CLINICAL DATA:  Unresponsive EXAM: PORTABLE CHEST 1 VIEW COMPARISON:  04/09/2020 FINDINGS: Cardiomegaly. Right dialysis catheter remains in place, unchanged. Mild vascular congestion. Small bilateral effusions again noted, unchanged. Interstitial and airspace opacities in the lower lungs could reflect edema. IMPRESSION: Cardiomegaly. Suspect mild pulmonary edema. Small bilateral effusions. Electronically Signed   By: Rolm Baptise M.D.   On: 2020-04-15 01:15   DG Chest Portable 1  View  Result Date: 03/13/2020 CLINICAL DATA:  Altered mental status with weakness EXAM: PORTABLE CHEST 1 VIEW COMPARISON:  Three days ago FINDINGS: Cardiomegaly. Dialysis catheter on the right with tip at the right atrium. Small pleural effusions and vascular congestion. IMPRESSION: Small pleural effusions and vascular congestion. Electronically Signed   By: Monte Fantasia M.D.   On: 03/30/2020 11:10   ECHOCARDIOGRAM COMPLETE  Result Date: 04/01/2020    ECHOCARDIOGRAM REPORT   Patient Name:   Kathy Phelps Date of Exam: 04/06/2020 Medical Rec #:  [id number]       Height:       48.0 in Accession #:    [id number]      Weight:       175.5 lb Date of Birth:  05-Jun-1981        BSA:          1.502 m Patient Age:    46 years        BP:           120/77 mmHg Patient Gender: F               HR:           98 bpm. Exam Location:  [facility] Procedure: 2D Echo, Cardiac Doppler and Color Doppler Indications:     Elevated troponin  History:         Patient has prior history of Echocardiogram examinations, most                  recent 11/01/2019. Stroke; Risk Factors:Hypertension, Diabetes                  and Dyslipidemia.  Sonographer:     Sherrie Sport RDCS (AE) Referring Phys:  [id number] Kate Sable Diagnosing Phys: Kate Sable MD IMPRESSIONS  1. Left ventricular ejection fraction, by estimation, is 45 to 50%. The left ventricle has mildly decreased function. The left ventricle demonstrates global hypokinesis. Left ventricular diastolic parameters are consistent with Grade II diastolic  dysfunction (pseudonormalization).  2. Right ventricular systolic function is normal. The right ventricular size is normal. There is mildly elevated pulmonary artery systolic pressure.  3. Left atrial size was mildly dilated.  4. There is a 12 x 15 mm mobile mass (clips 61 - 65) in the roof of the right atrium. This could be a thrombus or vegetation depending on clinical context.  5. The mitral valve is normal in structure. Mild to  moderate mitral valve regurgitation.  6. The aortic valve is tricuspid. Aortic valve regurgitation is mild. Mild aortic valve sclerosis is present, with no evidence of aortic valve stenosis.  7. The inferior vena cava is normal in size with greater than 50% respiratory variability, suggesting right atrial pressure of 3 mmHg. FINDINGS  Left Ventricle: Left ventricular ejection fraction, by estimation, is 45 to 50%. The left ventricle has mildly decreased function. The left ventricle demonstrates global hypokinesis. The left ventricular internal cavity size was normal in size. There is  no left ventricular hypertrophy. Left ventricular diastolic parameters are consistent with Grade II diastolic dysfunction (pseudonormalization). Right Ventricle: The right ventricular size is normal. No increase in right ventricular wall thickness. Right ventricular systolic function is normal. There is mildly elevated pulmonary artery systolic pressure. The tricuspid regurgitant velocity is 3.11  m/s, and with an assumed right atrial pressure of 3 mmHg, the estimated right ventricular systolic pressure is 55.3 mmHg. Left Atrium: Left atrial size was mildly dilated. Right Atrium: There is a 12 x 15 mm mobile mass (clips 61 - 65) in the roof of the right atrium. This could be a thrombus or vegetation depending on clinical context. Right atrial size was normal in size. Pericardium: There is no evidence of pericardial effusion. Mitral Valve: The mitral valve is normal in structure. Mild to moderate mitral valve regurgitation. Tricuspid Valve: The tricuspid valve is normal in structure. Tricuspid valve regurgitation is mild. Aortic Valve: The aortic valve is tricuspid. Aortic valve regurgitation is mild. Aortic regurgitation PHT measures 357 msec. Mild aortic valve sclerosis is present, with no evidence of aortic valve stenosis. Aortic valve mean gradient measures 4.7 mmHg. Aortic valve peak gradient measures 8.4 mmHg. Aortic valve area,  by VTI measures 2.22 cm. Pulmonic Valve: The pulmonic valve was not well visualized. Pulmonic valve regurgitation is not visualized. Aorta: The aortic root is normal in size and structure. Venous: The inferior vena cava is normal in size with greater than 50% respiratory variability, suggesting right atrial pressure of 3 mmHg. IAS/Shunts: No atrial level shunt detected by color flow Doppler.  LEFT VENTRICLE PLAX 2D LVIDd:         5.23 cm  Diastology LVIDs:         3.44 cm  LV e' lateral:   7.51 cm/s LV PW:         1.28 cm  LV E/e' lateral: 16.2 LV IVS:        0.89 cm  LV e' medial:    7.07 cm/s LVOT diam:     2.00 cm  LV E/e' medial:  17.3 LV SV:         48 LV SV Index:   32 LVOT Area:     3.14 cm  RIGHT VENTRICLE RV Basal diam:  3.09 cm RV S prime:     8.16 cm/s TAPSE (M-mode): 2.9 cm LEFT ATRIUM             Index       RIGHT ATRIUM  Index LA diam:        4.50 cm 3.00 cm/m  RA Area:     14.20 cm LA Vol (A2C):   54.1 ml 36.02 ml/m RA Volume:   33.50 ml  22.31 ml/m LA Vol (A4C):   55.9 ml 37.22 ml/m LA Biplane Vol: 56.6 ml 37.69 ml/m  AORTIC VALVE                   PULMONIC VALVE AV Area (Vmax):    1.79 cm    PV Vmax:        0.73 m/s AV Area (Vmean):   1.88 cm    PV Peak grad:   2.1 mmHg AV Area (VTI):     2.22 cm    RVOT Peak grad: 3 mmHg AV Vmax:           145.33 cm/s AV Vmean:          95.467 cm/s AV VTI:            0.215 m AV Peak Grad:      8.4 mmHg AV Mean Grad:      4.7 mmHg LVOT Vmax:         82.80 cm/s LVOT Vmean:        57.000 cm/s LVOT VTI:          0.152 m LVOT/AV VTI ratio: 0.71 AI PHT:            357 msec  AORTA Ao Root diam: 2.90 cm MITRAL VALVE                TRICUSPID VALVE MV Area (PHT): 4.10 cm     TR Peak grad:   38.7 mmHg MV Decel Time: 185 msec     TR Vmax:        311.00 cm/s MV E velocity: 122.00 cm/s MV A velocity: 48.50 cm/s   SHUNTS MV E/A ratio:  2.52         Systemic VTI:  0.15 m                             Systemic Diam: 2.00 cm Kate Sable MD Electronically  signed by Kate Sable MD Signature Date/Time: 03/12/2020/4:32:04 PM    Final    US Abdomen Limited RUQ  Result Date: 04/06/2020 CLINICAL DATA:  Multi organ failure elevated alk-phos EXAM: ULTRASOUND ABDOMEN LIMITED RIGHT UPPER QUADRANT COMPARISON:  CT 03/21/2020 FINDINGS: Gallbladder: Surgically absent Common bile duct: Diameter: 4.3 mm Liver: No focal hepatic abnormality. Slightly lobulated liver contour. Portal vein is patent on color Doppler imaging with normal direction of blood flow towards the liver. Other: Small amount of ascites in the right abdomen inferior to the liver. Incidental note made of right pleural effusion. IMPRESSION: 1. Status post cholecystectomy.  No biliary dilatation 2. Slightly lobulated liver contour, question cirrhosis. Small amount of ascites in the right upper quadrant. Right pleural effusion. Electronically Signed   By: Donavan Foil M.D.   On: 04/08/2020 20:24     Assessment/Plan:  39 y.o. 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 At baseline pt is alert and able to interact. Currently roving eye movements, abnormal respirations and not following commands.    - Worried about seizures or brainstem infarct given the breathing and roving eye movements more so to L side but has Doll's eyes -  Keppra 1g now - MRI  - EEG  - Further recommendations after the above studies.    Addendum: Imaging reviewed patient has bilateral infarcts along with some in the midbrain and pons. 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

## 2020-04-10 NOTE — Death Summary Note (Signed)
DEATH SUMMARY   Patient Details  Name: Kathy Phelps MRN: [id number] DOB: 04-06-1981  Admission/Discharge Information   Admit Date:  04/22/20  Date of Death: Date of Death: 04-23-2020  Time of Death: Time of Death: Dec 09, 1724  Length of Stay: 1  Referring Physician: Sandi Mariscal, MD   Reason(s) for Hospitalization  encephalopathy  Diagnoses  Preliminary cause of death: ISCHEMIC CARDIOMYOPATHY, ISCHEMIC STROKES,  LIVER FAILURE Secondary Diagnoses (including complications and co-morbidities):  Active Problems:   Encephalopathy   NSTEMI (non-ST elevated myocardial infarction) (HCC)   HFrEF (heart failure with reduced ejection fraction) (HCC)   Altered mental status   Elevated liver enzymes   Elevated troponin   Right atrial mass   Cardiomyopathy ([place])   Thrombocytopenia (HCC)   Coagulopathy ([place])   Acute liver failure with hepatic coma [facility])   Brief Hospital Course (including significant findings, care, treatment, and services provided and events leading to death)   39 y.o.femalewith PMH as noted below including ESRD on dialysis, diabetes, bilateral BKA's, and other PMH as noted below who presents with altered mental status.   Patient with [facility] NOW DNR/DNI  04-23-23 admitted to ICU for encephalopathy and liver failure 04/24/23 remains obtunded, sister was found, now DNR/DNI 04-24-2023 dx of NSTEMI, liver failure, ESRD ON HD 04/24/2023 severe acidosis, needs HD 2023/04/24 MRI confirms multiple acute infarcts  The Clinical status was relayed to family in detail.  Updated and notified of patients medical condition.  Patient remains unresponsive and will not open eyes to command.   Upon assessment his breath sounds are course crackles with significant secretions to his oral pharyngeal region.  Nasopharyngeal suction produced copious sanguineous secretions.  Patient is having a weak cough and struggling to remove secretions.  patient with increased WOB and using accessory muscles to  breathe Explained to family course of therapy and the modalities     Patient with Progressive multiorgan failure with very low chance of meaningful recovery despite all aggressive and optimal medical therapy.  Patient is in the dying  Process associated with suffering.  Family understands the situation.  They have consented and agreed to DNR/DNI and would like to proceed with Comfort care measures.  Family are satisfied with Plan of action and management. All questions answered       Pertinent Labs and Studies  Significant Diagnostic Studies EEG  Result Date: 2020-04-23 Lora Havens, MD     04/23/2020  2:39 PM Patient Name: Kathy Phelps MRN: [id number] Epilepsy Attending: Lora Havens Referring Physician/Provider: Dr. Flora Lipps Date: April 23, 2020 Duration: 22.30 minutes Patient history: 39 year old female presented with altered mental status and noted to have roving eye movements.  EEG today for seizures. Level of alertness:  comatose AEDs during EEG study: None Technical aspects: This EEG study was done with scalp electrodes positioned according to the 10-20 International system of electrode placement. Electrical activity was acquired at a sampling rate of 500Hz and reviewed with a high frequency filter of 70Hz and a low frequency filter of 1Hz. EEG data were recorded continuously and digitally stored. Description: EEG showed continuous generalized background attenuation.  EEG was not reactive to noxious stimulation.  Significant respiratory artifact was seen throughout the study. Hyperventilation and photic stimulation were not performed.   ABNORMALITY -Background attenuation, generalized IMPRESSION: This study is suggestive of profound diffuse encephalopathy, nonspecific etiology.  No seizures or epileptiform discharges were seen throughout the recording. Lora Havens   CT ABDOMEN PELVIS WO CONTRAST  Result Date: 03/21/2020  CLINICAL DATA:  Chest and abdominal pain  following dialysis EXAM: CT ABDOMEN AND PELVIS WITHOUT CONTRAST TECHNIQUE: Multidetector CT imaging of the abdomen and pelvis was performed following the standard protocol without IV contrast. COMPARISON:  06/15/2019 FINDINGS: Lower chest: Bilateral pleural effusions are noted right greater than left. Some dependent atelectatic changes are seen. Mild emphysematous changes are noted as well. Small pericardial effusion is seen. Hepatobiliary: No focal liver abnormality is seen. Status post cholecystectomy. No biliary dilatation. Pancreas: Unremarkable. No pancreatic ductal dilatation or surrounding inflammatory changes. Spleen: Normal in size without focal abnormality. Adrenals/Urinary Tract: Adrenal glands are within normal limits. Bladder is partially distended. Kidneys show tiny bilateral calculi without obstructive change. Stomach/Bowel: The appendix is within normal limits. Colon and small bowel show no obstructive or inflammatory change. The stomach is within normal limits. Vascular/Lymphatic: Aortic atherosclerosis. No enlarged abdominal or pelvic lymph nodes. Reproductive: Uterus and bilateral adnexa are unremarkable. Other: Mild to moderate ascites is noted. Changes of anasarca are seen as well. Musculoskeletal: No acute or significant osseous findings. IMPRESSION: New ascites and changes of anasarca. This may be related to volume overload. Bilateral pleural effusions with basilar atelectatic changes. Tiny renal calculi without obstructive change. Electronically Signed   By: Inez Catalina M.D.   On: 03/21/2020 18:38   DG Chest 1 View  Result Date: 03/21/2020 CLINICAL DATA:  Chest pain.  Pain onset during dialysis. EXAM: CHEST  1 VIEW COMPARISON:  Most recent radiograph 01/27/2020. FINDINGS: Right-sided dialysis catheter tip remains in the right atrium. Cardiomegaly with unchanged mediastinal contours. Improved bilateral pleural effusions from prior exam with small residual. Vascular congestion. Possible  mild pulmonary edema. No focal airspace disease. No pneumothorax. IMPRESSION: 1. Vascular congestion and possible mild pulmonary edema. Improved bilateral pleural effusions from imaging last month. 2. Stable cardiomegaly. Electronically Signed   By: Keith Rake M.D.   On: 03/21/2020 16:38   DG Shoulder 1 View Left  Result Date: 03/21/2020 CLINICAL DATA:  Left chest and shoulder pain. EXAM: LEFT SHOULDER COMPARISON:  None. FINDINGS: Single AP view of the left shoulder obtained. There is no evidence of fracture or dislocation. There is no evidence of arthropathy or other focal bone abnormality. Soft tissues are unremarkable. IMPRESSION: Negative single AP view of the left shoulder. Electronically Signed   By: Keith Rake M.D.   On: 03/21/2020 16:39   CT Head Wo Contrast  Result Date: 04/07/2020 CLINICAL DATA:  Altered mental status EXAM: CT HEAD WITHOUT CONTRAST TECHNIQUE: Contiguous axial images were obtained from the base of the skull through the vertex without intravenous contrast. COMPARISON:  November 29, 2019 FINDINGS: Brain: The ventricles and sulci are normal in size and configuration. There is no intracranial mass, hemorrhage, extra-axial fluid collection, or midline shift. There are prior infarcts in each posterosuperior parietal lobe as well as in the medial occipital lobes bilaterally and in the mid right occipital lobe. No acute appearing infarct is demonstrable on this study. Vascular: No hyperdense vessels. There are is slight calcification in each carotid siphon region. Skull: The bony calvarium appears intact. Sinuses/Orbits: Visualized paranasal sinuses are clear. Orbits appear symmetric bilaterally. Other: Mastoid air cells are clear. IMPRESSION: Scattered prior infarcts in the occipital and parietal lobes bilaterally. No acute appearing infarct is evident. No appreciable mass or hemorrhage. Slight calcification noted in each carotid siphon. Electronically Signed   By: Lowella Grip III M.D.   On: 03/14/2020 11:04   MR BRAIN WO CONTRAST  Result Date: April 04, 2020 CLINICAL DATA:  39 year old  female dialysis patient with altered mental status common cephalopathy. Scattered small infarcts in the anterior and posterior circulation in February. EXAM: MRI HEAD WITHOUT CONTRAST TECHNIQUE: Multiplanar, multiecho pulse sequences of the brain and surrounding structures were obtained without intravenous contrast. COMPARISON:  Head CT 03/14/2020.  Brain MRI 11/30/2019. FINDINGS: Brain: Multifocal subacute and chronic encephalomalacia in the bilateral parietal and occipital lobes appears stable since February. New widely scattered bilateral cerebral and cerebellar foci of restricted diffusion (series 2, images 41, 34, 18). The right caudate is involved. The other deep gray nuclei are relatively spared. There is some involvement of the left splenium of the corpus callosum. The brainstem appears largely spared. T2 and FLAIR hyperintensity also at the acute areas with no definite acute hemorrhage. There are scattered microhemorrhages in the bilateral brain and cerebellum, including 1 at the left operculum on series 8, image 18 which was not apparent in February, but the seem to be nonacute. No intracranial mass effect. No ventriculomegaly. Normal basilar cisterns. Negative pituitary and cervicomedullary junction. Vascular: Major intracranial vascular flow voids appear grossly stable. Skull and upper cervical spine: Bone marrow signal appears stable and within normal limits. Stable visible cervical spine. Sinuses/Orbits: Leftward gaze deviation, otherwise negative orbits. Paranasal Visualized paranasal sinuses and mastoids are stable and well pneumatized. Other: Trace retained secretions in the pharynx. IMPRESSION: 1. Acute infarcts widely scattered in the bilateral anterior and posterior circulation, more numerous than those that occurred in February. The pattern is strongly suggestive of recent  cardiac embolic event. Hypotensive episode resulting in watershed ischemia is felt less likely. 2. No associated acute hemorrhage or mass effect. 3. Underlying chronic microhemorrhages and extensive prior ischemia, with developing encephalomalacia most pronounced in the parietal and occipital lobes. Electronically Signed   By: Genevie Ann M.D.   On: Mar 31, 2020 12:14   DG Chest Port 1 View  Result Date: March 31, 2020 CLINICAL DATA:  Unresponsive EXAM: PORTABLE CHEST 1 VIEW COMPARISON:  03/19/2020 FINDINGS: Cardiomegaly. Right dialysis catheter remains in place, unchanged. Mild vascular congestion. Small bilateral effusions again noted, unchanged. Interstitial and airspace opacities in the lower lungs could reflect edema. IMPRESSION: Cardiomegaly. Suspect mild pulmonary edema. Small bilateral effusions. Electronically Signed   By: Rolm Baptise M.D.   On: 03/31/20 01:15   DG Chest Portable 1 View  Result Date: 03/31/2020 CLINICAL DATA:  Altered mental status with weakness EXAM: PORTABLE CHEST 1 VIEW COMPARISON:  Three days ago FINDINGS: Cardiomegaly. Dialysis catheter on the right with tip at the right atrium. Small pleural effusions and vascular congestion. IMPRESSION: Small pleural effusions and vascular congestion. Electronically Signed   By: Monte Fantasia M.D.   On: 04/08/2020 11:10   ECHOCARDIOGRAM COMPLETE  Result Date: 03/23/2020    ECHOCARDIOGRAM REPORT   Patient Name:   Kathy Phelps Date of Exam: 03/27/2020 Medical Rec #:  [id number]       Height:       48.0 in Accession #:    [id number]      Weight:       175.5 lb Date of Birth:  06-09-1981        BSA:          1.502 m Patient Age:    38 years        BP:           120/77 mmHg Patient Gender: F               HR:  98 bpm. Exam Location:  [facility] Procedure: 2D Echo, Cardiac Doppler and Color Doppler Indications:     Elevated troponin  History:         Patient has prior history of Echocardiogram examinations, most                  recent 11/01/2019.  Stroke; Risk Factors:Hypertension, Diabetes                  and Dyslipidemia.  Sonographer:     Sherrie Sport RDCS (AE) Referring Phys:  [id number] Kate Sable Diagnosing Phys: Kate Sable MD IMPRESSIONS  1. Left ventricular ejection fraction, by estimation, is 45 to 50%. The left ventricle has mildly decreased function. The left ventricle demonstrates global hypokinesis. Left ventricular diastolic parameters are consistent with Grade II diastolic dysfunction (pseudonormalization).  2. Right ventricular systolic function is normal. The right ventricular size is normal. There is mildly elevated pulmonary artery systolic pressure.  3. Left atrial size was mildly dilated.  4. There is a 12 x 15 mm mobile mass (clips 61 - 65) in the roof of the right atrium. This could be a thrombus or vegetation depending on clinical context.  5. The mitral valve is normal in structure. Mild to moderate mitral valve regurgitation.  6. The aortic valve is tricuspid. Aortic valve regurgitation is mild. Mild aortic valve sclerosis is present, with no evidence of aortic valve stenosis.  7. The inferior vena cava is normal in size with greater than 50% respiratory variability, suggesting right atrial pressure of 3 mmHg. FINDINGS  Left Ventricle: Left ventricular ejection fraction, by estimation, is 45 to 50%. The left ventricle has mildly decreased function. The left ventricle demonstrates global hypokinesis. The left ventricular internal cavity size was normal in size. There is  no left ventricular hypertrophy. Left ventricular diastolic parameters are consistent with Grade II diastolic dysfunction (pseudonormalization). Right Ventricle: The right ventricular size is normal. No increase in right ventricular wall thickness. Right ventricular systolic function is normal. There is mildly elevated pulmonary artery systolic pressure. The tricuspid regurgitant velocity is 3.11  m/s, and with an assumed right atrial pressure of 3 mmHg, the  estimated right ventricular systolic pressure is 10.1 mmHg. Left Atrium: Left atrial size was mildly dilated. Right Atrium: There is a 12 x 15 mm mobile mass (clips 61 - 65) in the roof of the right atrium. This could be a thrombus or vegetation depending on clinical context. Right atrial size was normal in size. Pericardium: There is no evidence of pericardial effusion. Mitral Valve: The mitral valve is normal in structure. Mild to moderate mitral valve regurgitation. Tricuspid Valve: The tricuspid valve is normal in structure. Tricuspid valve regurgitation is mild. Aortic Valve: The aortic valve is tricuspid. Aortic valve regurgitation is mild. Aortic regurgitation PHT measures 357 msec. Mild aortic valve sclerosis is present, with no evidence of aortic valve stenosis. Aortic valve mean gradient measures 4.7 mmHg. Aortic valve peak gradient measures 8.4 mmHg. Aortic valve area, by VTI measures 2.22 cm. Pulmonic Valve: The pulmonic valve was not well visualized. Pulmonic valve regurgitation is not visualized. Aorta: The aortic root is normal in size and structure. Venous: The inferior vena cava is normal in size with greater than 50% respiratory variability, suggesting right atrial pressure of 3 mmHg. IAS/Shunts: No atrial level shunt detected by color flow Doppler.  LEFT VENTRICLE PLAX 2D LVIDd:         5.23 cm  Diastology LVIDs:  3.44 cm  LV e' lateral:   7.51 cm/s LV PW:         1.28 cm  LV E/e' lateral: 16.2 LV IVS:        0.89 cm  LV e' medial:    7.07 cm/s LVOT diam:     2.00 cm  LV E/e' medial:  17.3 LV SV:         48 LV SV Index:   32 LVOT Area:     3.14 cm  RIGHT VENTRICLE RV Basal diam:  3.09 cm RV S prime:     8.16 cm/s TAPSE (M-mode): 2.9 cm LEFT ATRIUM             Index       RIGHT ATRIUM           Index LA diam:        4.50 cm 3.00 cm/m  RA Area:     14.20 cm LA Vol (A2C):   54.1 ml 36.02 ml/m RA Volume:   33.50 ml  22.31 ml/m LA Vol (A4C):   55.9 ml 37.22 ml/m LA Biplane Vol: 56.6 ml  37.69 ml/m  AORTIC VALVE                   PULMONIC VALVE AV Area (Vmax):    1.79 cm    PV Vmax:        0.73 m/s AV Area (Vmean):   1.88 cm    PV Peak grad:   2.1 mmHg AV Area (VTI):     2.22 cm    RVOT Peak grad: 3 mmHg AV Vmax:           145.33 cm/s AV Vmean:          95.467 cm/s AV VTI:            0.215 m AV Peak Grad:      8.4 mmHg AV Mean Grad:      4.7 mmHg LVOT Vmax:         82.80 cm/s LVOT Vmean:        57.000 cm/s LVOT VTI:          0.152 m LVOT/AV VTI ratio: 0.71 AI PHT:            357 msec  AORTA Ao Root diam: 2.90 cm MITRAL VALVE                TRICUSPID VALVE MV Area (PHT): 4.10 cm     TR Peak grad:   38.7 mmHg MV Decel Time: 185 msec     TR Vmax:        311.00 cm/s MV E velocity: 122.00 cm/s MV A velocity: 48.50 cm/s   SHUNTS MV E/A ratio:  2.52         Systemic VTI:  0.15 m                             Systemic Diam: 2.00 cm Kate Sable MD Electronically signed by Kate Sable MD Signature Date/Time: 04/04/2020/4:32:04 PM    Final    US Abdomen Limited RUQ  Result Date: 03/23/2020 CLINICAL DATA:  Multi organ failure elevated alk-phos EXAM: ULTRASOUND ABDOMEN LIMITED RIGHT UPPER QUADRANT COMPARISON:  CT 03/21/2020 FINDINGS: Gallbladder: Surgically absent Common bile duct: Diameter: 4.3 mm Liver: No focal hepatic abnormality. Slightly lobulated liver contour. Portal vein is patent on color Doppler imaging with normal direction of blood flow  towards the liver. Other: Small amount of ascites in the right abdomen inferior to the liver. Incidental note made of right pleural effusion. IMPRESSION: 1. Status post cholecystectomy.  No biliary dilatation 2. Slightly lobulated liver contour, question cirrhosis. Small amount of ascites in the right upper quadrant. Right pleural effusion. Electronically Signed   By: Donavan Foil M.D.   On: 03/19/2020 20:24    Microbiology Recent Results (from the past 240 hour(s))  SARS Coronavirus 2 by RT PCR (hospital order, performed in [facility]  hospital lab) Nasopharyngeal Nasopharyngeal Swab     Status: None   Collection Time: 04/03/2020 11:55 AM   Specimen: Nasopharyngeal Swab  Result Value Ref Range Status   SARS Coronavirus 2 NEGATIVE NEGATIVE Final    Comment: (NOTE) SARS-CoV-2 target nucleic acids are NOT DETECTED.  The SARS-CoV-2 RNA is generally detectable in upper and lower respiratory specimens during the acute phase of infection. The lowest concentration of SARS-CoV-2 viral copies this assay can detect is 250 copies / mL. A negative result does not preclude SARS-CoV-2 infection and should not be used as the sole basis for treatment or other patient management decisions.  A negative result may occur with improper specimen collection / handling, submission of specimen other than nasopharyngeal swab, presence of viral mutation(s) within the areas targeted by this assay, and inadequate number of viral copies (<250 copies / mL). A negative result must be combined with clinical observations, patient history, and epidemiological information.  Fact Sheet for Patients:   StrictlyIdeas.no  Fact Sheet for Healthcare Providers: BankingDealers.co.za  This test is not yet approved or  cleared by the [place] FDA and has been authorized for detection and/or diagnosis of SARS-CoV-2 by FDA under an Emergency Use Authorization (EUA).  This EUA will remain in effect (meaning this test can be used) for the duration of the COVID-19 declaration under Section 564(b)(1) of the Act, 21 U.S.C. section 360bbb-3(b)(1), unless the authorization is terminated or revoked sooner.  Performed at [facility], [address] [place], [place] [address]   MRSA PCR Screening     Status: None   Collection Time: 03/26/2020  1:00 PM   Specimen: Nasopharyngeal  Result Value Ref Range Status   MRSA by PCR NEGATIVE NEGATIVE Final    Comment:        The GeneXpert MRSA Assay (FDA approved  for NASAL specimens only), is one component of a comprehensive MRSA colonization surveillance program. It is not intended to diagnose MRSA infection nor to guide or monitor treatment for MRSA infections. Performed at [facility], [address] [place], [place] [address]     Lab Basic Metabolic Panel: Recent Labs  Lab 03/21/20 1504 03/23/2020 1033 04/01/2020 1956 April 06, 2020 0416 2020/04/06 0932  NA 123* 124*  --  126* 129*  K 4.9 5.5* 6.2* 6.5* 6.6*  CL 89* 89*  --  90* 90*  CO2 20* 17*  --  9* 11*  GLUCOSE 101* 72  --  100* 74  BUN 47* 74*  --  82* 83*  CREATININE 5.83* 7.23*  --  7.33* 7.38*  CALCIUM 7.8* 7.8*  --  7.7* 7.5*  MG  --   --  2.2 2.3  --   PHOS  --   --   --  7.4*  --    Liver Function Tests: Recent Labs  Lab 03/21/20 1504 03/22/2020 1033 Apr 06, 2020 0932  AST 20 44* 74*  ALT _0 ALKPHOS 211* 252* 170*  BILITOT 4.0* 12.2* 13.3*  PROT 6.0* 6.0* 5.1*  ALBUMIN 2.4* 2.2* 1.9*   Recent Labs  Lab 03/21/20 1504 03/17/2020 1033  LIPASE 14 16   Recent Labs  Lab 04/05/2020 1033  AMMONIA 42*   CBC: Recent Labs  Lab 03/21/20 1504 04/06/2020 1033 16-Apr-2020 0416  WBC 6.7 15.4* 19.6*  NEUTROABS 6.2 12.4*  --   HGB 10.8* 11.0* 10.2*  HCT 32.4* 30.9* 29.7*  MCV 91.3 84.9 89.2  PLT 96* 22* 14*   Cardiac Enzymes: No results for input(s): CKTOTAL, CKMB, CKMBINDEX, TROPONINI in the last 168 hours. Sepsis Labs: Recent Labs  Lab 03/21/20 1504 04/01/2020 1033 03/20/2020 1155 03/28/2020 1956 03/27/2020 2002 2020-04-16 0416  PROCALCITON  --   --   --  5.96  --  5.37  WBC 6.7 15.4*  --   --   --  19.6*  LATICACIDVEN  --  2.6* 2.7*  --  7.1*  --        04/16/2020, 6:25 PM

## 2020-04-10 NOTE — Consult Note (Signed)
Consultation Note Date: 2020-04-02   Patient Name: Kathy Phelps  DOB: 1981-02-12  MRN: [id number]  Age / Sex: 39 y.o., female  PCP: Sandi Mariscal, MD Referring Physician: Flora Lipps, MD  Reason for Consultation: Establishing goals of care  HPI/Patient Profile: Kathy Phelps is an 39 y.o. 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 At baseline pt is alert and able to interact.   Clinical Assessment and Goals of Care: Patient is resting in bed. Her breathing is regular with deep breathes. She is gazing at the left wall, nonverbal.   Spoke with her sister via phone. She states she has a meeting planned this afternoon with CCM to discuss her status. She states patient's significant other  Kathy Phelps is out of town for work and will not be present, but she is keeping he and the children updated. Kathy Phelps has 3 children, 17, 15, and 13.   She states her sister's status began to decline when she had her legs amputated. She states she does not like dialysis, and it drains her. She tells me Kathy Phelps is non-complaint with any sort of dietary restrictions, and has edema such that she has not been able to be fitted for prosthetics, and subsequently is wheelchair bound.   She states she has been updated on her sister's status. She states the family went through a difficult process before their mother died. She does not want to see her sister suffer.    Kathy Phelps states her sister has not been happy with her QOL since the amputations, and tells me she has been fighting to keep going only for her children. She advises that she would not want to D/C to a SNF.   She tells me she would like to treat the treatable for the next few days to see if she improves, and if not she would want to shift to comfort care. Kathy Phelps understands there  are currently tests/imaging in process. She tells me that if something comes back critical in these tests, she would consider shifting to comfort at that time. She confirms DNR/DNI status.     SUMMARY OF RECOMMENDATIONS    Palliative will continue to follow.  DNR/DNI. Continue to treat the treatable for the next few days, if no improvement, patient would want comfort based care. If something critical comes from the testing, they may consider transition to comfort care at that time.    Prognosis:   Very poor      Primary Diagnoses: Present on Admission: . Encephalopathy   I have reviewed the medical record, interviewed the patient and family, and examined the patient. The following aspects are pertinent.  Past Medical History:  Diagnosis Date  . Anxiety   . Asthma   . Bipolar affective ([place])   . Complete miscarriage   . Constipation   . 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 Depression   . Diabetes mellitus  Type II  . Dyspnea     " only at night and I stop breathing in my sleep too for about the last three weeks"  . GERD (gastroesophageal reflux disease)   . Headache    "migraraines"  . Hyperlipidemia   . Hypertension   . Osteomyelitis ([place])    left foot  . Pregnancy complication    HELP Syndrome  . Renal disorder   . Schizophrenia ([place])   . Stroke [facility])    " mild stroke", memory loss- approx 2017   Social History   Socioeconomic History  . Marital status: Single    Spouse name: Not on file  . Number of children: Not on file  . Years of education: Not on file  . Highest education level: Not on file  Occupational History  . Not on file  Tobacco Use  . Smoking status: Current Every Day Smoker    Packs/day: 1.00    Years: 28.00    Pack years: 28.00    Types: Cigarettes  . Smokeless tobacco: Never Used  Vaping Use  . Vaping Use: Former  Substance and Sexual Activity  . Alcohol use: No  . Drug use: No  . Sexual activity: Not  on file  Other Topics Concern  . Not on file  Social History Narrative  . Not on file   Social Determinants of Health   Financial Resource Strain:   . Difficulty of Paying Living Expenses:   Food Insecurity:   . Worried About Charity fundraiser in the Last Year:   . Arboriculturist in the Last Year:   Transportation Needs:   . Film/video editor (Medical):   Marland Kitchen Lack of Transportation (Non-Medical):   Physical Activity:   . Days of Exercise per Week:   . Minutes of Exercise per Session:   Stress:   . Feeling of Stress :   Social Connections:   . Frequency of Communication with Friends and Family:   . Frequency of Social Gatherings with Friends and Family:   . Attends Religious Services:   . Active Member of Clubs or Organizations:   . Attends Archivist Meetings:   Marland Kitchen Marital Status:    Family History  Problem Relation Age of Onset  . Diabetes Mellitus II Mother   . Heart disease Mother   . Heart disease Father    Scheduled Meds: . Chlorhexidine Gluconate Cloth  6 each Topical Q0600  . epoetin (EPOGEN/PROCRIT) injection  4,000 Units Intravenous Once  . pantoprazole (PROTONIX) IV  40 mg Intravenous Q24H  . sodium chloride flush  3 mL Intravenous Q12H   Continuous Infusions: . sodium chloride    . albumin human    . cefTRIAXone (ROCEPHIN)  IV Stopped (04/02/2020 1726)   PRN Meds:.sodium chloride, acetaminophen, docusate sodium, ondansetron (ZOFRAN) IV, polyethylene glycol, sodium chloride flush Medications Prior to Admission:  Prior to Admission medications   Medication Sig Start Date End Date Taking? Authorizing Provider  ALPRAZolam Duanne Moron) 1 MG tablet Take 1 mg by mouth 3 (three) times daily as needed for anxiety.  06/07/19  Yes [provider]  amLODipine (NORVASC) 5 MG tablet Take 5 mg by mouth every evening.  12/04/19  Yes [provider]  baclofen (LIORESAL) 10 MG tablet Take 10 mg by mouth 3 (three) times daily as needed. 02/28/20  Yes  [provider]  cloNIDine (CATAPRES - DOSED IN MG/24 HR) 0.1 mg/24hr patch Place 0.1 mg onto the skin once  a week.  01/03/20  Yes [provider]  hydrOXYzine (ATARAX/VISTARIL) 50 MG tablet Take 50 mg by mouth every 6 (six) hours as needed for anxiety.  12/27/19  Yes [provider]  LATUDA 60 MG TABS Take 60 mg by mouth at bedtime.  07/05/19  Yes [provider]  losartan (COZAAR) 50 MG tablet Take 50 mg by mouth every evening.  01/22/20  Yes [provider]  oxyCODONE-acetaminophen (PERCOCET) 10-325 MG tablet Take 1 tablet by mouth 4 (four) times daily as needed. 02/28/20  Yes [provider]  Vitamin D, Ergocalciferol, (DRISDOL) 1.25 MG (50000 UT) CAPS capsule Take 50,000 Units by mouth once a week. 07/13/19  Yes [provider]  gabapentin (NEURONTIN) 800 MG tablet Take 1 tablet (800 mg total) by mouth 2 (two) times daily. 02/11/17   Hosie Poisson, MD  oxyCODONE-acetaminophen (PERCOCET) 7.5-325 MG tablet Take 1 tablet by mouth 4 (four) times daily as needed for pain. Patient not taking: Reported on 03/26/2020 11/23/19   [provider]   Allergies  Allergen Reactions  . Hydrocodone-Acetaminophen Hives  . Tylenol [Acetaminophen] Itching and Swelling    Patient tolerated APAP 650 mg (06/29/18) as well as oxycodone/APAP 5-325 mg during 06/25/2018 admission ??   Review of Systems  Unable to perform ROS   Physical Exam Constitutional:      Comments: Gaze at left wall. Regular deep breathing.      Vital Signs: BP 100/65   Pulse 80   Temp 98.2 F (36.8 C) (Axillary)   Resp 16   Ht 4' (1.219 m)   Wt 79.4 kg   SpO2 99%   BMI 53.42 kg/m  Pain Scale: CPOT   Pain Score: 0-No pain   SpO2: SpO2: 99 % O2 Device:SpO2: 99 % O2 Flow Rate: .   IO: Intake/output summary:   Intake/Output Summary (Last 24 hours) at 03/27/2020 1255 Last data filed at 03-27-2020 1016 Gross per 24 hour  Intake 654.83 ml  Output 10 ml  Net  644.83 ml    LBM: Last BM Date:  (PTA) Baseline Weight: Weight: 93 kg Most recent weight: Weight: 79.4 kg     Palliative Assessment/Data:     Time In: 12:45 Time Out: 1:00 Time Total: 45 min Greater than 50%  of this time was spent counseling and coordinating care related to the above assessment and plan.  Plan reviewed with CCM.   Signed by: Asencion Gowda, NP   Please contact Palliative Medicine Team phone at [phone] for questions and concerns.  For individual provider: See Shea Evans

## 2020-04-10 NOTE — Progress Notes (Signed)
Kathy Lame, MD [facility]   [address], [place], [place] [address] Phone: [phone] Fax : [phone]   Subjective: The patient remains responsive only to pain.  The patient's labs have shown an increase bilirubin with slightly increased LFTs.  The patient is status post MI and has end-stage renal disease.  The patient also has severe acidosis.   Objective: Vital signs in last 24 hours: Vitals:   2020/04/12 0300 04-12-20 0400 2020-04-12 0500 2020/04/12 0600  BP: 100/67 102/70 102/68 107/68  Pulse: 80 82 86 86  Resp: 19 16 (!) 21 (!) 22  Temp:  97.8 F (36.6 C)    TempSrc:  Axillary    SpO2: 99% 99% 95% 97%  Weight:      Height:       Weight change:   Intake/Output Summary (Last 24 hours) at 2020/04/12 1107 Last data filed at 2020-04-12 1016 Gross per 24 hour  Intake 754.83 ml  Output 10 ml  Net 744.83 ml     Exam: Heart:: Regular rate and rhythm, S1S2 present or without murmur or extra heart sounds Lungs: normal and clear to auscultation and percussion Abdomen: soft, nontender, normal bowel sounds   Lab Results: '@LABTEST2' @ Micro Results: Recent Results (from the past 240 hour(s))  SARS Coronavirus 2 by RT PCR (hospital order, performed in [place] hospital lab) Nasopharyngeal Nasopharyngeal Swab     Status: None   Collection Time: 04/06/2020 11:55 AM   Specimen: Nasopharyngeal Swab  Result Value Ref Range Status   SARS Coronavirus 2 NEGATIVE NEGATIVE Final    Comment: (NOTE) SARS-CoV-2 target nucleic acids are NOT DETECTED.  The SARS-CoV-2 RNA is generally detectable in upper and lower respiratory specimens during the acute phase of infection. The lowest concentration of SARS-CoV-2 viral copies this assay can detect is 250 copies / mL. A negative result does not preclude SARS-CoV-2 infection and should not be used as the sole basis for treatment or other patient management decisions.  A negative result may occur with improper specimen collection /  handling, submission of specimen other than nasopharyngeal swab, presence of viral mutation(s) within the areas targeted by this assay, and inadequate number of viral copies (<250 copies / mL). A negative result must be combined with clinical observations, patient history, and epidemiological information.  Fact Sheet for Patients:   StrictlyIdeas.no  Fact Sheet for Healthcare Providers: BankingDealers.co.za  This test is not yet approved or  cleared by the [place] FDA and has been authorized for detection and/or diagnosis of SARS-CoV-2 by FDA under an Emergency Use Authorization (EUA).  This EUA will remain in effect (meaning this test can be used) for the duration of the COVID-19 declaration under Section 564(b)(1) of the Act, 21 U.S.C. section 360bbb-3(b)(1), unless the authorization is terminated or revoked sooner.  Performed at [facility], [address] [place], [place] [address]   MRSA PCR Screening     Status: None   Collection Time: 03/21/2020  1:00 PM   Specimen: Nasopharyngeal  Result Value Ref Range Status   MRSA by PCR NEGATIVE NEGATIVE Final    Comment:        The GeneXpert MRSA Assay (FDA approved for NASAL specimens only), is one component of a comprehensive MRSA colonization surveillance program. It is not intended to diagnose MRSA infection nor to guide or monitor treatment for MRSA infections. Performed at [facility], [address] [place], [place] [address]    Studies/Results: CT Head Wo Contrast  Result Date:  03/18/2020 CLINICAL DATA:  Altered mental status EXAM: CT HEAD WITHOUT CONTRAST TECHNIQUE: Contiguous axial images were obtained from the base of the skull through the vertex without intravenous contrast. COMPARISON:  November 29, 2019 FINDINGS: Brain: The ventricles and sulci are normal in size and configuration. There is no intracranial mass, hemorrhage, extra-axial  fluid collection, or midline shift. There are prior infarcts in each posterosuperior parietal lobe as well as in the medial occipital lobes bilaterally and in the mid right occipital lobe. No acute appearing infarct is demonstrable on this study. Vascular: No hyperdense vessels. There are is slight calcification in each carotid siphon region. Skull: The bony calvarium appears intact. Sinuses/Orbits: Visualized paranasal sinuses are clear. Orbits appear symmetric bilaterally. Other: Mastoid air cells are clear. IMPRESSION: Scattered prior infarcts in the occipital and parietal lobes bilaterally. No acute appearing infarct is evident. No appreciable mass or hemorrhage. Slight calcification noted in each carotid siphon. Electronically Signed   By: Lowella Grip III M.D.   On: 04/01/2020 11:04   DG Chest Port 1 View  Result Date: 04-06-2020 CLINICAL DATA:  Unresponsive EXAM: PORTABLE CHEST 1 VIEW COMPARISON:  04/03/2020 FINDINGS: Cardiomegaly. Right dialysis catheter remains in place, unchanged. Mild vascular congestion. Small bilateral effusions again noted, unchanged. Interstitial and airspace opacities in the lower lungs could reflect edema. IMPRESSION: Cardiomegaly. Suspect mild pulmonary edema. Small bilateral effusions. Electronically Signed   By: Rolm Baptise M.D.   On: 04/06/20 01:15   DG Chest Portable 1 View  Result Date: 03/23/2020 CLINICAL DATA:  Altered mental status with weakness EXAM: PORTABLE CHEST 1 VIEW COMPARISON:  Three days ago FINDINGS: Cardiomegaly. Dialysis catheter on the right with tip at the right atrium. Small pleural effusions and vascular congestion. IMPRESSION: Small pleural effusions and vascular congestion. Electronically Signed   By: Monte Fantasia M.D.   On: 03/17/2020 11:10   ECHOCARDIOGRAM COMPLETE  Result Date: 04/06/2020    ECHOCARDIOGRAM REPORT   Patient Name:   Kathy Phelps Date of Exam: 04/09/2020 Medical Rec #:  [id number]       Height:       48.0 in  Accession #:    [id number]      Weight:       175.5 lb Date of Birth:  [birth year]        BSA:          1.502 m Patient Age:    39 years        BP:           120/77 mmHg Patient Gender: F               HR:           98 bpm. Exam Location:  [facility] Procedure: 2D Echo, Cardiac Doppler and Color Doppler Indications:     Elevated troponin  History:         Patient has prior history of Echocardiogram examinations, most                  recent 11/01/2019. Stroke; Risk Factors:Hypertension, Diabetes                  and Dyslipidemia.  Sonographer:     Sherrie Sport RDCS (AE) Referring Phys:  [id number] Kathy Phelps Diagnosing Phys: Kathy Sable MD IMPRESSIONS  1. Left ventricular ejection fraction, by estimation, is 45 to 50%. The left ventricle has mildly decreased function. The left ventricle demonstrates global hypokinesis. Left ventricular diastolic parameters are consistent with Grade  II diastolic dysfunction (pseudonormalization).  2. Right ventricular systolic function is normal. The right ventricular size is normal. There is mildly elevated pulmonary artery systolic pressure.  3. Left atrial size was mildly dilated.  4. There is a 12 x 15 mm mobile mass (clips 61 - 65) in the roof of the right atrium. This could be a thrombus or vegetation depending on clinical context.  5. The mitral valve is normal in structure. Mild to moderate mitral valve regurgitation.  6. The aortic valve is tricuspid. Aortic valve regurgitation is mild. Mild aortic valve sclerosis is present, with no evidence of aortic valve stenosis.  7. The inferior vena cava is normal in size with greater than 50% respiratory variability, suggesting right atrial pressure of 3 mmHg. FINDINGS  Left Ventricle: Left ventricular ejection fraction, by estimation, is 45 to 50%. The left ventricle has mildly decreased function. The left ventricle demonstrates global hypokinesis. The left ventricular internal cavity size was normal in size. There is  no left  ventricular hypertrophy. Left ventricular diastolic parameters are consistent with Grade II diastolic dysfunction (pseudonormalization). Right Ventricle: The right ventricular size is normal. No increase in right ventricular wall thickness. Right ventricular systolic function is normal. There is mildly elevated pulmonary artery systolic pressure. The tricuspid regurgitant velocity is 3.11  m/s, and with an assumed right atrial pressure of 3 mmHg, the estimated right ventricular systolic pressure is 85.9 mmHg. Left Atrium: Left atrial size was mildly dilated. Right Atrium: There is a 12 x 15 mm mobile mass (clips 61 - 65) in the roof of the right atrium. This could be a thrombus or vegetation depending on clinical context. Right atrial size was normal in size. Pericardium: There is no evidence of pericardial effusion. Mitral Valve: The mitral valve is normal in structure. Mild to moderate mitral valve regurgitation. Tricuspid Valve: The tricuspid valve is normal in structure. Tricuspid valve regurgitation is mild. Aortic Valve: The aortic valve is tricuspid. Aortic valve regurgitation is mild. Aortic regurgitation PHT measures 357 msec. Mild aortic valve sclerosis is present, with no evidence of aortic valve stenosis. Aortic valve mean gradient measures 4.7 mmHg. Aortic valve peak gradient measures 8.4 mmHg. Aortic valve area, by VTI measures 2.22 cm. Pulmonic Valve: The pulmonic valve was not well visualized. Pulmonic valve regurgitation is not visualized. Aorta: The aortic root is normal in size and structure. Venous: The inferior vena cava is normal in size with greater than 50% respiratory variability, suggesting right atrial pressure of 3 mmHg. IAS/Shunts: No atrial level shunt detected by color flow Doppler.  LEFT VENTRICLE PLAX 2D LVIDd:         5.23 cm  Diastology LVIDs:         3.44 cm  LV e' lateral:   7.51 cm/s LV PW:         1.28 cm  LV E/e' lateral: 16.2 LV IVS:        0.89 cm  LV e' medial:    7.07  cm/s LVOT diam:     2.00 cm  LV E/e' medial:  17.3 LV SV:         48 LV SV Index:   32 LVOT Area:     3.14 cm  RIGHT VENTRICLE RV Basal diam:  3.09 cm RV S prime:     8.16 cm/s TAPSE (M-mode): 2.9 cm LEFT ATRIUM             Index       RIGHT ATRIUM  Index LA diam:        4.50 cm 3.00 cm/m  RA Area:     14.20 cm LA Vol (A2C):   54.1 ml 36.02 ml/m RA Volume:   33.50 ml  22.31 ml/m LA Vol (A4C):   55.9 ml 37.22 ml/m LA Biplane Vol: 56.6 ml 37.69 ml/m  AORTIC VALVE                   PULMONIC VALVE AV Area (Vmax):    1.79 cm    PV Vmax:        0.73 m/s AV Area (Vmean):   1.88 cm    PV Peak grad:   2.1 mmHg AV Area (VTI):     2.22 cm    RVOT Peak grad: 3 mmHg AV Vmax:           145.33 cm/s AV Vmean:          95.467 cm/s AV VTI:            0.215 m AV Peak Grad:      8.4 mmHg AV Mean Grad:      4.7 mmHg LVOT Vmax:         82.80 cm/s LVOT Vmean:        57.000 cm/s LVOT VTI:          0.152 m LVOT/AV VTI ratio: 0.71 AI PHT:            357 msec  AORTA Ao Root diam: 2.90 cm MITRAL VALVE                TRICUSPID VALVE MV Area (PHT): 4.10 cm     TR Peak grad:   38.7 mmHg MV Decel Time: 185 msec     TR Vmax:        311.00 cm/s MV E velocity: 122.00 cm/s MV A velocity: 48.50 cm/s   SHUNTS MV E/A ratio:  2.52         Systemic VTI:  0.15 m                             Systemic Diam: 2.00 cm Kathy Sable MD Electronically signed by Kathy Sable MD Signature Date/Time: 03/14/2020/4:32:04 PM    Final    US Abdomen Limited RUQ  Result Date: 04/02/2020 CLINICAL DATA:  Multi organ failure elevated alk-phos EXAM: ULTRASOUND ABDOMEN LIMITED RIGHT UPPER QUADRANT COMPARISON:  CT 03/21/2020 FINDINGS: Gallbladder: Surgically absent Common bile duct: Diameter: 4.3 mm Liver: No focal hepatic abnormality. Slightly lobulated liver contour. Portal vein is patent on color Doppler imaging with normal direction of blood flow towards the liver. Other: Small amount of ascites in the right abdomen inferior to the liver.  Incidental note made of right pleural effusion. IMPRESSION: 1. Status post cholecystectomy.  No biliary dilatation 2. Slightly lobulated liver contour, question cirrhosis. Small amount of ascites in the right upper quadrant. Right pleural effusion. Electronically Signed   By: Donavan Foil M.D.   On: 04/02/2020 20:24   Medications: I have reviewed the patient's current medications. Scheduled Meds: . Chlorhexidine Gluconate Cloth  6 each Topical Q0600  . epoetin (EPOGEN/PROCRIT) injection  4,000 Units Intravenous Once  . pantoprazole (PROTONIX) IV  40 mg Intravenous Q24H  . sodium chloride flush  3 mL Intravenous Q12H   Continuous Infusions: . sodium chloride    . albumin human    . cefTRIAXone (ROCEPHIN)  IV Stopped (04/04/2020 1726)  .  levETIRAcetam     PRN Meds:.sodium chloride, acetaminophen, docusate sodium, ondansetron (ZOFRAN) IV, polyethylene glycol, sodium chloride flush   Assessment: Active Problems:   Encephalopathy   NSTEMI (non-ST elevated myocardial infarction) (HCC)   HFrEF (heart failure with reduced ejection fraction) ([place])   Altered mental status    Plan: This patient has abnormal liver enzymes but does not have a confirmed diagnosis of liver failure.  The patient may have a portal vein thrombosis as the cause of the abnormal liver enzymes and it is unlikely that the increased bilirubin is causing the patient's mental status changes.  Have ordered an INR to see if the patient synthetic function remains intact.  The patient's previous pro time was 16.9 with an INR of 1.4.  Further GI work-up will be initiated if the patient's general condition improves.   LOS: 1 day   Kathy Phelps Mar 26, 2020, 11:07 AM Pager [phone] 7am-5pm  Check AMION for 5pm -7am coverage and on weekends

## 2020-04-10 NOTE — Progress Notes (Signed)
Bicarb drip stopped at this time per Hinton Dyer, NP.  1mg  of morphine given to decrease patient discomfort as evidenced by moaning and grimacing. Will continue to monitor.  Cameron Ali, RN

## 2020-04-10 NOTE — Progress Notes (Addendum)
Patient remains only responsive to pain, and cannot answer orientation questions or follow commands. Pupils are 3 and sluggish and the patient only occasionally moans, giving her a CPOT score of 1. Patient was found to be in metabolic acidosis, therefore she was given 100 mEq of sodium bicarbonate injection and started on IV sodium bicarbonate 150 mEq in dextrose 5% at 50 mL/hr. She was also started on Q4H CBG checks and was found to be hypoglycemic with a CBG of 49. She was given one ampule of dextrose 50%, and her CBG was 124 after recheck. Patient has a Purewick in place for urine collection, and an I&O cath was performed once for lab collection, which yielded 10 mL of urine. She is a HD patient and has bilateral below the knee amputations. She appears comfortable at this time. All vitals are currently stable. Will continue to monitor.  Cameron Ali, RN

## 2020-04-10 NOTE — Progress Notes (Signed)
Chaplain offered family (sister, cousin, children whom she had not had contact with for some time, and father of children) support at time of death including prayer, encouragement, words of assurance as well as education related to next steps with funeral home. Family was provided AD number to aid in funeral arrangements. Family appreciated comfort cart and hospitality.     04/11/2020 2000  Clinical Encounter Type  Visited With Patient and family together  Visit Type Initial;Death  Referral From Nurse

## 2020-04-10 NOTE — Progress Notes (Signed)
End of Shift Summary:      Patient had increased work of breathing and was being medicated with morphine every 15 minutes as needed prior to ceasing to breathe at 1724. Family remains at bedside at this time.  Currently speaking with Chaplain.  Emotional support given to family.

## 2020-04-10 NOTE — Progress Notes (Signed)
[facility], [place] March 27, 2020  Subjective:   LOS: 1  Patient known to our practice from outpatient dialysis.  She presents from [place] [employer] via EMS for altered mental status.  Case discussed with outpatient dialysis center.  Nursing reports that she was alert and oriented on Friday.  She had chest pain radiating to her arm on Friday therefore was sent to the emergency room for evaluation.  She underwent a CT without contrast which showed volume overload, bilateral effusions, anasarca.  Chest x-ray showed vascular congestion and mild pulmonary edema, stable cardiomegaly, bilateral pleural effusions. Last HD was March 21, 2020.  Target weight 71 kg.  Preweight 80 kg.   Overnight patient remains critically ill Was given iv Bicarb for acidosis - but stopped due to concern of volume overload Also given morphine x 2 She is moaning. Not following commands   Objective:  Vital signs in last 24 hours:  Temp:  [97.8 F (36.6 C)-99 F (37.2 C)] 97.8 F (36.6 C) (06/15 0400) Pulse Rate:  [80-103] 86 (06/15 0600) Resp:  [9-22] 22 (06/15 0600) BP: (93-127)/(66-87) 107/68 (06/15 0600) SpO2:  [95 %-100 %] 97 % (06/15 0600) Weight:  [79.4 kg-93 kg] 79.4 kg (06/15 0253)  Weight change:  Filed Weights   04/09/2020 1030 03/15/2020 1254 03/27/2020 0253  Weight: 93 kg 79.6 kg 79.4 kg    Intake/Output:    Intake/Output Summary (Last 24 hours) at 2020-03-27 0814 Last data filed at Mar 27, 2020 0400 Gross per 24 hour  Intake 751.83 ml  Output 10 ml  Net 741.83 ml     Physical Exam: General:  Critically ill-appearing  HEENT  pupils are round, dry mouth  Pulm/lungs  shallow breathing effort, room air, b/l crackles  CVS/Heart  regular rhythm  Abdomen:   Soft, mildly distended  Extremities:  3-4+ pitting edema up to lower abdomen  Neurologic:  Responds only to painful stimuli  Skin:  Jaundice  Access:  Right IJ PermCath       Basic Metabolic Panel:  Recent Labs   Lab 03/21/20 1504 03/30/2020 1033 03/31/2020 1956 Mar 27, 2020 0416  NA 123* 124*  --  PENDING  K 4.9 5.5* 6.2* 6.5*  CL 89* 89*  --  90*  CO2 20* 17*  --  9*  GLUCOSE 101* 72  --  100*  BUN 47* 74*  --  82*  CREATININE 5.83* 7.23*  --  7.33*  CALCIUM 7.8* 7.8*  --  7.7*  MG  --   --  2.2 2.3  PHOS  --   --   --  7.4*     CBC: Recent Labs  Lab 03/21/20 1504 03/28/2020 1033 03-27-2020 0416  WBC 6.7 15.4* 19.6*  NEUTROABS 6.2 12.4*  --   HGB 10.8* 11.0* 10.2*  HCT 32.4* 30.9* 29.7*  MCV 91.3 84.9 89.2  PLT 96* 22* 14*      Lab Results  Component Value Date   HEPBSAG NON REACTIVE 11/01/2019   HEPBSAB NON REACTIVE 11/01/2019      Microbiology:  Recent Results (from the past 240 hour(s))  SARS Coronavirus 2 by RT PCR (hospital order, performed in [place] hospital lab) Nasopharyngeal Nasopharyngeal Swab     Status: None   Collection Time: 04/07/2020 11:55 AM   Specimen: Nasopharyngeal Swab  Result Value Ref Range Status   SARS Coronavirus 2 NEGATIVE NEGATIVE Final    Comment: (NOTE) SARS-CoV-2 target nucleic acids are NOT DETECTED.  The SARS-CoV-2 RNA is generally detectable in  upper and lower respiratory specimens during the acute phase of infection. The lowest concentration of SARS-CoV-2 viral copies this assay can detect is 250 copies / mL. A negative result does not preclude SARS-CoV-2 infection and should not be used as the sole basis for treatment or other patient management decisions.  A negative result may occur with improper specimen collection / handling, submission of specimen other than nasopharyngeal swab, presence of viral mutation(s) within the areas targeted by this assay, and inadequate number of viral copies (<250 copies / mL). A negative result must be combined with clinical observations, patient history, and epidemiological information.  Fact Sheet for Patients:   StrictlyIdeas.no  Fact Sheet for Healthcare  Providers: BankingDealers.co.za  This test is not yet approved or  cleared by the [place] FDA and has been authorized for detection and/or diagnosis of SARS-CoV-2 by FDA under an Emergency Use Authorization (EUA).  This EUA will remain in effect (meaning this test can be used) for the duration of the COVID-19 declaration under Section 564(b)(1) of the Act, 21 U.S.C. section 360bbb-3(b)(1), unless the authorization is terminated or revoked sooner.  Performed at [facility], [address] [place], [place] [address]   MRSA PCR Screening     Status: None   Collection Time: 03/11/2020  1:00 PM   Specimen: Nasopharyngeal  Result Value Ref Range Status   MRSA by PCR NEGATIVE NEGATIVE Final    Comment:        The GeneXpert MRSA Assay (FDA approved for NASAL specimens only), is one component of a comprehensive MRSA colonization surveillance program. It is not intended to diagnose MRSA infection nor to guide or monitor treatment for MRSA infections. Performed at [facility], [address] [place], [place] [address]     Coagulation Studies: Recent Labs    04/02/2020 1033  LABPROT 16.9*  INR 1.4*    Urinalysis: Recent Labs    03/11/2020 [place] 1.010  PHURINE 7.0  GLUCOSEU NEGATIVE  HGBUR NEGATIVE  BILIRUBINUR NEGATIVE  KETONESUR NEGATIVE  PROTEINUR >=300*  NITRITE NEGATIVE  LEUKOCYTESUR TRACE*      Imaging: CT Head Wo Contrast  Result Date: 03/11/2020 CLINICAL DATA:  Altered mental status EXAM: CT HEAD WITHOUT CONTRAST TECHNIQUE: Contiguous axial images were obtained from the base of the skull through the vertex without intravenous contrast. COMPARISON:  November 29, 2019 FINDINGS: Brain: The ventricles and sulci are normal in size and configuration. There is no intracranial mass, hemorrhage, extra-axial fluid collection, or midline shift. There are prior infarcts in each posterosuperior parietal  lobe as well as in the medial occipital lobes bilaterally and in the mid right occipital lobe. No acute appearing infarct is demonstrable on this study. Vascular: No hyperdense vessels. There are is slight calcification in each carotid siphon region. Skull: The bony calvarium appears intact. Sinuses/Orbits: Visualized paranasal sinuses are clear. Orbits appear symmetric bilaterally. Other: Mastoid air cells are clear. IMPRESSION: Scattered prior infarcts in the occipital and parietal lobes bilaterally. No acute appearing infarct is evident. No appreciable mass or hemorrhage. Slight calcification noted in each carotid siphon. Electronically Signed   By: Lowella Grip III M.D.   On: 03/15/2020 11:04   DG Chest Port 1 View  Result Date: 04/09/2020 CLINICAL DATA:  Unresponsive EXAM: PORTABLE CHEST 1 VIEW COMPARISON:  03/20/2020 FINDINGS: Cardiomegaly. Right dialysis catheter remains in place, unchanged. Mild vascular congestion. Small bilateral effusions again noted, unchanged. Interstitial and airspace opacities in the lower lungs could reflect  edema. IMPRESSION: Cardiomegaly. Suspect mild pulmonary edema. Small bilateral effusions. Electronically Signed   By: Rolm Baptise M.D.   On: 2020/04/03 01:15   DG Chest Portable 1 View  Result Date: 04/02/2020 CLINICAL DATA:  Altered mental status with weakness EXAM: PORTABLE CHEST 1 VIEW COMPARISON:  Three days ago FINDINGS: Cardiomegaly. Dialysis catheter on the right with tip at the right atrium. Small pleural effusions and vascular congestion. IMPRESSION: Small pleural effusions and vascular congestion. Electronically Signed   By: Monte Fantasia M.D.   On: 03/16/2020 11:10   ECHOCARDIOGRAM COMPLETE  Result Date: 03/31/2020    ECHOCARDIOGRAM REPORT   Patient Name:   LAURA CALDAS Date of Exam: 04/08/2020 Medical Rec #:  [id number]       Height:       48.0 in Accession #:    [id number]      Weight:       175.5 lb Date of Birth:  [birth year]        BSA:           1.502 m Patient Age:    39 years        BP:           120/77 mmHg Patient Gender: F               HR:           98 bpm. Exam Location:  [facility] Procedure: 2D Echo, Cardiac Doppler and Color Doppler Indications:     Elevated troponin  History:         Patient has prior history of Echocardiogram examinations, most                  recent 11/01/2019. Stroke; Risk Factors:Hypertension, Diabetes                  and Dyslipidemia.  Sonographer:     Sherrie Sport RDCS (AE) Referring Phys:  [id number] Kate Sable Diagnosing Phys: Kate Sable MD IMPRESSIONS  1. Left ventricular ejection fraction, by estimation, is 45 to 50%. The left ventricle has mildly decreased function. The left ventricle demonstrates global hypokinesis. Left ventricular diastolic parameters are consistent with Grade II diastolic dysfunction (pseudonormalization).  2. Right ventricular systolic function is normal. The right ventricular size is normal. There is mildly elevated pulmonary artery systolic pressure.  3. Left atrial size was mildly dilated.  4. There is a 12 x 15 mm mobile mass (clips 61 - 65) in the roof of the right atrium. This could be a thrombus or vegetation depending on clinical context.  5. The mitral valve is normal in structure. Mild to moderate mitral valve regurgitation.  6. The aortic valve is tricuspid. Aortic valve regurgitation is mild. Mild aortic valve sclerosis is present, with no evidence of aortic valve stenosis.  7. The inferior vena cava is normal in size with greater than 50% respiratory variability, suggesting right atrial pressure of 3 mmHg. FINDINGS  Left Ventricle: Left ventricular ejection fraction, by estimation, is 45 to 50%. The left ventricle has mildly decreased function. The left ventricle demonstrates global hypokinesis. The left ventricular internal cavity size was normal in size. There is  no left ventricular hypertrophy. Left ventricular diastolic parameters are consistent with Grade II diastolic  dysfunction (pseudonormalization). Right Ventricle: The right ventricular size is normal. No increase in right ventricular wall thickness. Right ventricular systolic function is normal. There is mildly elevated pulmonary artery systolic pressure. The tricuspid regurgitant velocity is 3.11  m/s, and  with an assumed right atrial pressure of 3 mmHg, the estimated right ventricular systolic pressure is 30.8 mmHg. Left Atrium: Left atrial size was mildly dilated. Right Atrium: There is a 12 x 15 mm mobile mass (clips 61 - 65) in the roof of the right atrium. This could be a thrombus or vegetation depending on clinical context. Right atrial size was normal in size. Pericardium: There is no evidence of pericardial effusion. Mitral Valve: The mitral valve is normal in structure. Mild to moderate mitral valve regurgitation. Tricuspid Valve: The tricuspid valve is normal in structure. Tricuspid valve regurgitation is mild. Aortic Valve: The aortic valve is tricuspid. Aortic valve regurgitation is mild. Aortic regurgitation PHT measures 357 msec. Mild aortic valve sclerosis is present, with no evidence of aortic valve stenosis. Aortic valve mean gradient measures 4.7 mmHg. Aortic valve peak gradient measures 8.4 mmHg. Aortic valve area, by VTI measures 2.22 cm. Pulmonic Valve: The pulmonic valve was not well visualized. Pulmonic valve regurgitation is not visualized. Aorta: The aortic root is normal in size and structure. Venous: The inferior vena cava is normal in size with greater than 50% respiratory variability, suggesting right atrial pressure of 3 mmHg. IAS/Shunts: No atrial level shunt detected by color flow Doppler.  LEFT VENTRICLE PLAX 2D LVIDd:         5.23 cm  Diastology LVIDs:         3.44 cm  LV e' lateral:   7.51 cm/s LV PW:         1.28 cm  LV E/e' lateral: 16.2 LV IVS:        0.89 cm  LV e' medial:    7.07 cm/s LVOT diam:     2.00 cm  LV E/e' medial:  17.3 LV SV:         48 LV SV Index:   32 LVOT Area:      3.14 cm  RIGHT VENTRICLE RV Basal diam:  3.09 cm RV S prime:     8.16 cm/s TAPSE (M-mode): 2.9 cm LEFT ATRIUM             Index       RIGHT ATRIUM           Index LA diam:        4.50 cm 3.00 cm/m  RA Area:     14.20 cm LA Vol (A2C):   54.1 ml 36.02 ml/m RA Volume:   33.50 ml  22.31 ml/m LA Vol (A4C):   55.9 ml 37.22 ml/m LA Biplane Vol: 56.6 ml 37.69 ml/m  AORTIC VALVE                   PULMONIC VALVE AV Area (Vmax):    1.79 cm    PV Vmax:        0.73 m/s AV Area (Vmean):   1.88 cm    PV Peak grad:   2.1 mmHg AV Area (VTI):     2.22 cm    RVOT Peak grad: 3 mmHg AV Vmax:           145.33 cm/s AV Vmean:          95.467 cm/s AV VTI:            0.215 m AV Peak Grad:      8.4 mmHg AV Mean Grad:      4.7 mmHg LVOT Vmax:         82.80 cm/s LVOT Vmean:  57.000 cm/s LVOT VTI:          0.152 m LVOT/AV VTI ratio: 0.71 AI PHT:            357 msec  AORTA Ao Root diam: 2.90 cm MITRAL VALVE                TRICUSPID VALVE MV Area (PHT): 4.10 cm     TR Peak grad:   38.7 mmHg MV Decel Time: 185 msec     TR Vmax:        311.00 cm/s MV E velocity: 122.00 cm/s MV A velocity: 48.50 cm/s   SHUNTS MV E/A ratio:  2.52         Systemic VTI:  0.15 m                             Systemic Diam: 2.00 cm Kate Sable MD Electronically signed by Kate Sable MD Signature Date/Time: 03/22/2020/4:32:04 PM    Final    US Abdomen Limited RUQ  Result Date: 04/05/2020 CLINICAL DATA:  Multi organ failure elevated alk-phos EXAM: ULTRASOUND ABDOMEN LIMITED RIGHT UPPER QUADRANT COMPARISON:  CT 03/21/2020 FINDINGS: Gallbladder: Surgically absent Common bile duct: Diameter: 4.3 mm Liver: No focal hepatic abnormality. Slightly lobulated liver contour. Portal vein is patent on color Doppler imaging with normal direction of blood flow towards the liver. Other: Small amount of ascites in the right abdomen inferior to the liver. Incidental note made of right pleural effusion. IMPRESSION: 1. Status post cholecystectomy.  No biliary  dilatation 2. Slightly lobulated liver contour, question cirrhosis. Small amount of ascites in the right upper quadrant. Right pleural effusion. Electronically Signed   By: Donavan Foil M.D.   On: 04/07/2020 20:24     Medications:   . sodium chloride    . cefTRIAXone (ROCEPHIN)  IV Stopped (03/31/2020 1726)  . famotidine (PEPCID) IV 20 mg (04/05/2020 2115)   . Chlorhexidine Gluconate Cloth  6 each Topical Q0600  .  morphine injection  1 mg Intravenous Once  . sodium chloride flush  3 mL Intravenous Q12H   sodium chloride, acetaminophen, docusate sodium, ondansetron (ZOFRAN) IV, polyethylene glycol, sodium chloride flush  Assessment/ Plan:  39 y.o. female with multiple medical problems including anxiety, diabetes, hypertension, end-stage renal disease, bilateral BKA, history of stroke with left-sided weakness was admitted on 03/23/2020 for  Active Problems:   Encephalopathy   NSTEMI (non-ST elevated myocardial infarction) ([place])   HFrEF (heart failure with reduced ejection fraction) ([facility])   Altered mental status  Encephalopathy [G93.40] NSTEMI (non-ST elevated myocardial infarction) ([place]) [I21.4] Altered mental status, unspecified altered mental status type [R41.82] Hypervolemia, unspecified hypervolemia type [E87.70] Acute liver failure with hepatic coma ([place]) [K72.01]  [facility]/MWF/[address] dialysis/71 kg  #.ESRD #Volume overload #Hyperkalemia #Altered mental status-  Patient presents with altered mental status and volume overload. She received partial dialysis treatment on June 11 She has significant volume overload today. Differential of altered mental status includes stroke (CT head negative for intracranial bleed), sepsis, uremia, toxic metabolic encephalopathy, seizures Patient is critically ill and goals of care are being clarified.  Palliative care team is evaluating the case along with ICU team. Family discussion with sister later today    - will plan for HD today to  correct potassium, uremia and remove volume as tolerated  #. Anemia of CKD  Lab Results  Component Value Date   HGB 10.2 (L) Apr 12, 2020   Low dose EPO  with HD  #. Secondary hyperparathyroidism of renal origin N 25.81      Component Value Date/Time   PTH NOT PERFORMED 06/16/2019 0611   Lab Results  Component Value Date   PHOS 7.4 (H) April 09, 2020   Monitor calcium and phos level during this admission   #.  Altered mental status See above  #Shock liver Probably related to cardiac dysfunction 2 D echo results  From 6/14: LVEF 45-50%, global hypokinesis, grade 2 diastolic dysfunction   LOS: [place] 06/30/218:14 AM  [place], [place]

## 2020-04-10 DEATH — deceased

## 2020-06-18 IMAGING — US US RENAL
1 series · 14 of 25 positions shown · non-contrast
Comparison: Abdominal CT dated 06/25/2018

CLINICAL DATA: 37-year-old female with acute renal insufficiency.

EXAM:
RENAL / URINARY TRACT ULTRASOUND COMPLETE

[Series 1: us renal · 0.26mm/px · 14 of 39 slices shown]
[im 1/39]
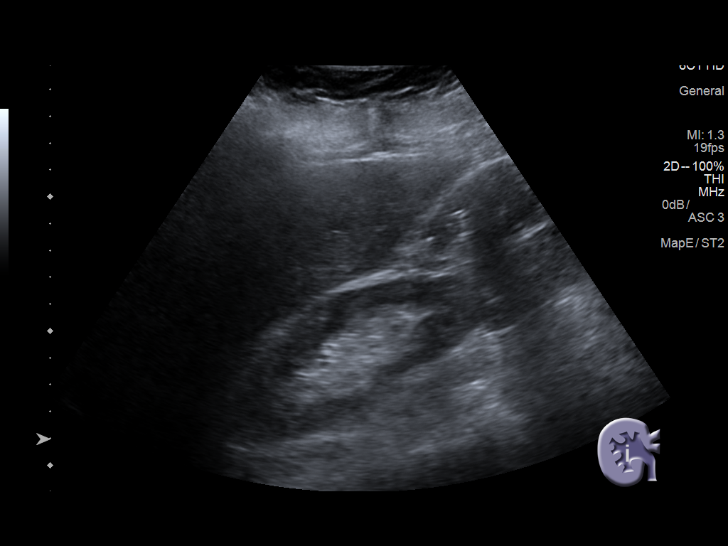
[im 4/39]
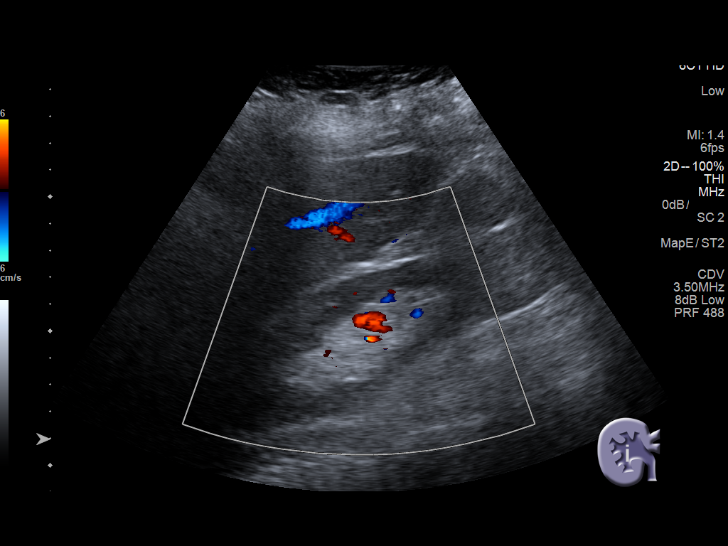
[im 7/39]
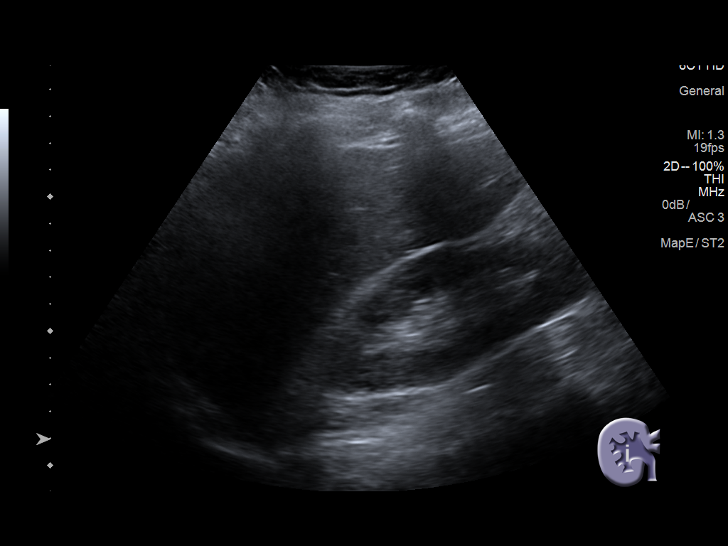
[im 10/39]
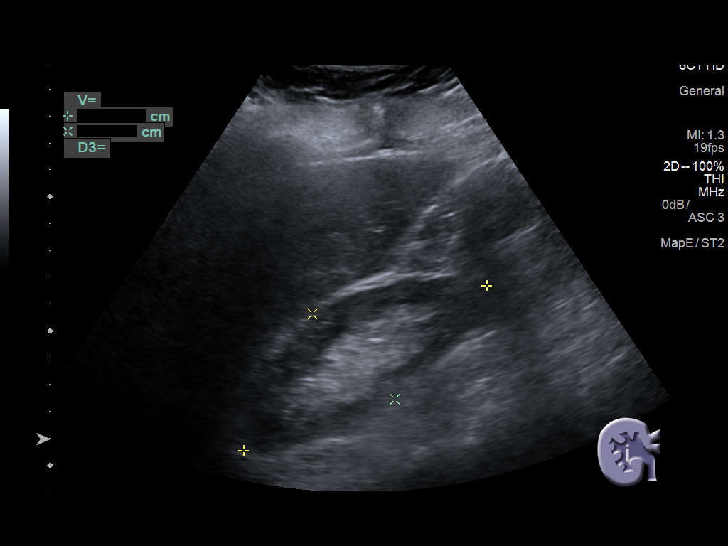
[im 13/39]
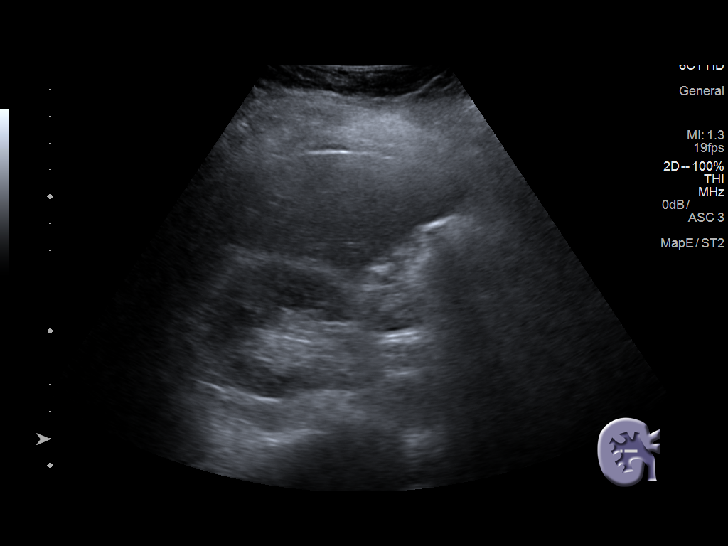
[im 15/39]
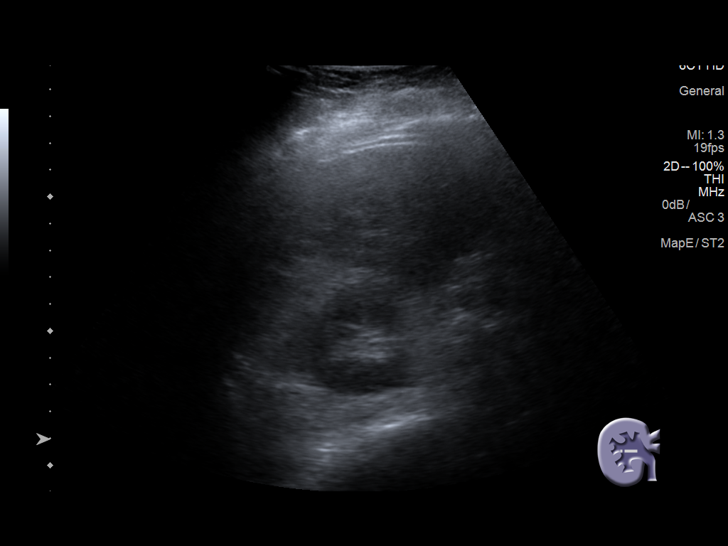
[im 18/39]
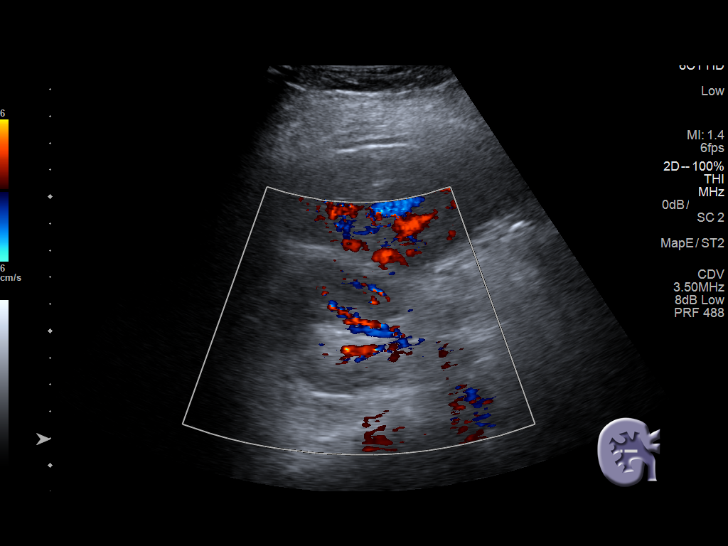
[im 21/39]
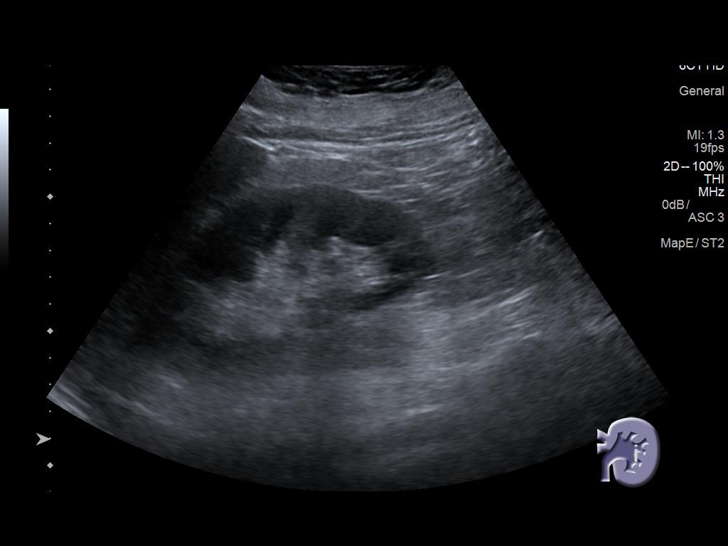
[im 24/39]
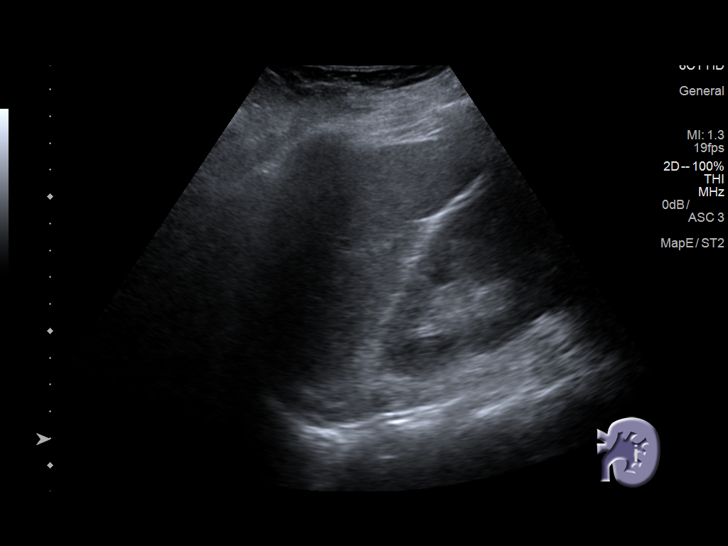
[im 26/39]
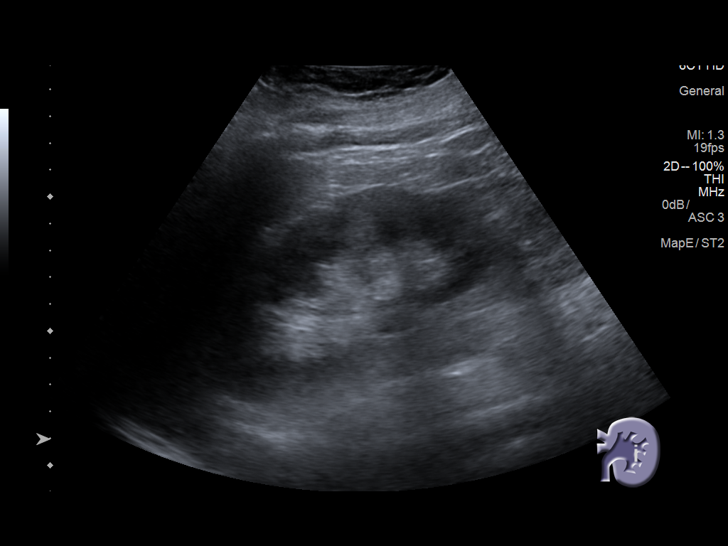
[im 29/39]
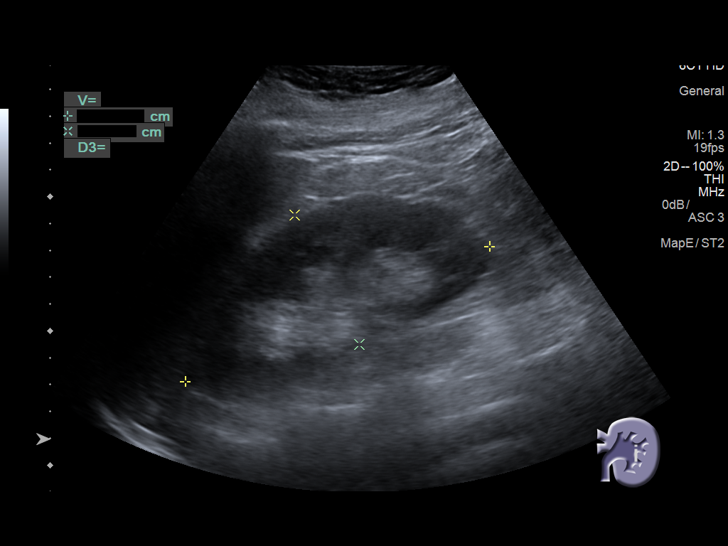
[im 32/39]
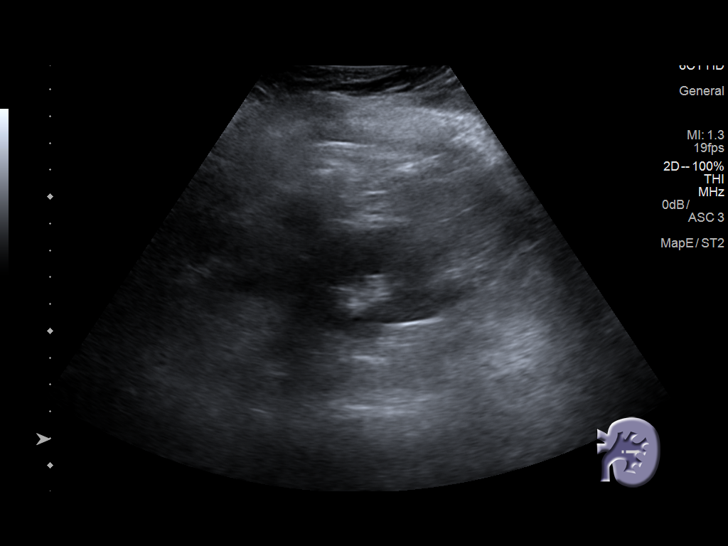
[im 35/39]
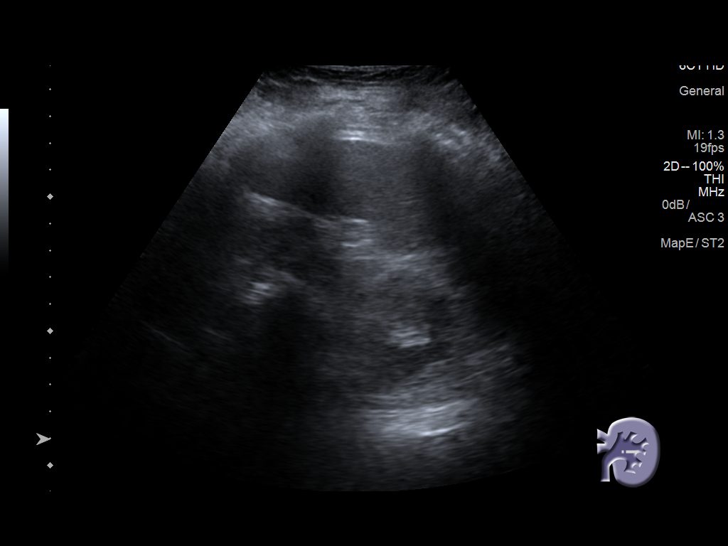
[im 39/39]
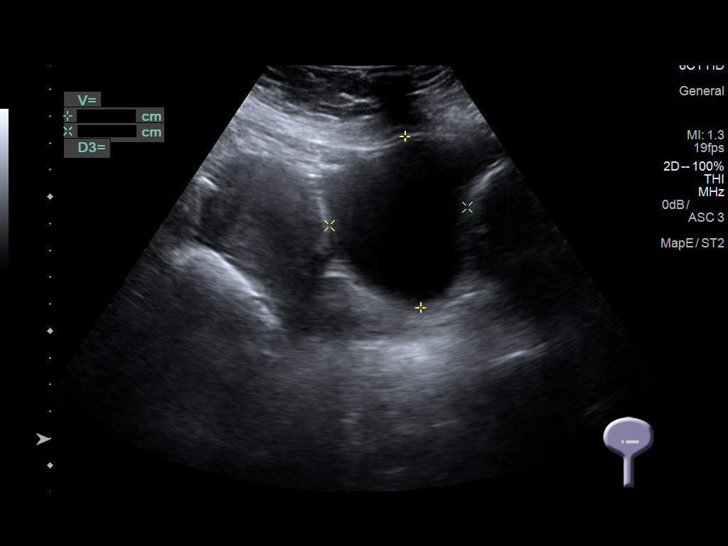

[14 of 25 positions shown; findings below may reference images not displayed]

FINDINGS: Right Kidney:

Renal measurements: 11.5 x 4.6 x 5.1 cm = volume: 145.8 mL. Normal
echogenicity. No hydronephrosis or shadowing stone.

Left Kidney:

Renal measurements: 12.4 x 5.3 x 5.5 cm = volume: 193.3 mL. Normal
echogenicity. No hydronephrosis or shadowing stone.

Bladder:

Appears normal for degree of bladder distention.
IMPRESSION: Unremarkable renal ultrasound.
